# Patient Record
Sex: Female | Born: 1970
Health system: Southern US, Community
[De-identification: ages and names within clinical notes are randomized; demographics above are authoritative.]

## PROBLEM LIST (undated history)

## (undated) ENCOUNTER — Emergency Department (HOSPITAL_COMMUNITY): Discharge status: Absent without leave | Payer: Self-pay

## (undated) DIAGNOSIS — Z124 Encounter for screening for malignant neoplasm of cervix: Secondary | ICD-10-CM

## (undated) DIAGNOSIS — C50919 Malignant neoplasm of unspecified site of unspecified female breast: Secondary | ICD-10-CM

## (undated) DIAGNOSIS — M1990 Unspecified osteoarthritis, unspecified site: Secondary | ICD-10-CM

## (undated) DIAGNOSIS — J45909 Unspecified asthma, uncomplicated: Secondary | ICD-10-CM

## (undated) DIAGNOSIS — I429 Cardiomyopathy, unspecified: Secondary | ICD-10-CM

## (undated) DIAGNOSIS — Z0389 Encounter for observation for other suspected diseases and conditions ruled out: Secondary | ICD-10-CM

## (undated) DIAGNOSIS — K219 Gastro-esophageal reflux disease without esophagitis: Secondary | ICD-10-CM

## (undated) DIAGNOSIS — G43909 Migraine, unspecified, not intractable, without status migrainosus: Secondary | ICD-10-CM

## (undated) DIAGNOSIS — C7800 Secondary malignant neoplasm of unspecified lung: Secondary | ICD-10-CM

## (undated) DIAGNOSIS — I480 Paroxysmal atrial fibrillation: Secondary | ICD-10-CM

## (undated) DIAGNOSIS — I4892 Unspecified atrial flutter: Secondary | ICD-10-CM

## (undated) DIAGNOSIS — M539 Dorsopathy, unspecified: Secondary | ICD-10-CM

## (undated) DIAGNOSIS — C7931 Secondary malignant neoplasm of brain: Secondary | ICD-10-CM

## (undated) DIAGNOSIS — IMO0001 Reserved for inherently not codable concepts without codable children: Secondary | ICD-10-CM

## (undated) DIAGNOSIS — R928 Other abnormal and inconclusive findings on diagnostic imaging of breast: Secondary | ICD-10-CM

## (undated) HISTORY — DX: Unspecified osteoarthritis, unspecified site: M19.90

## (undated) HISTORY — PX: CHOLECYSTECTOMY: SHX55

## (undated) HISTORY — DX: Malignant neoplasm of unspecified site of unspecified female breast: C50.919

## (undated) HISTORY — DX: Encounter for screening for malignant neoplasm of cervix: Z12.4

## (undated) HISTORY — DX: Gastro-esophageal reflux disease without esophagitis: K21.9

## (undated) HISTORY — DX: Dorsopathy, unspecified: M53.9

## (undated) HISTORY — DX: Other abnormal and inconclusive findings on diagnostic imaging of breast: R92.8

---

## 2015-01-23 ENCOUNTER — Emergency Department (HOSPITAL_BASED_OUTPATIENT_CLINIC_OR_DEPARTMENT_OTHER): Payer: Federal, State, Local not specified - PPO

## 2015-01-23 ENCOUNTER — Emergency Department (HOSPITAL_BASED_OUTPATIENT_CLINIC_OR_DEPARTMENT_OTHER)
Admission: EM | Admit: 2015-01-23 | Discharge: 2015-01-23 | Disposition: A | Discharge status: Home / Self Care | Payer: Federal, State, Local not specified - PPO | Attending: Emergency Medicine | Admitting: Emergency Medicine

## 2015-01-23 ENCOUNTER — Encounter (HOSPITAL_BASED_OUTPATIENT_CLINIC_OR_DEPARTMENT_OTHER): Payer: Self-pay

## 2015-01-23 DIAGNOSIS — Z3202 Encounter for pregnancy test, result negative: Secondary | ICD-10-CM | POA: Diagnosis not present

## 2015-01-23 DIAGNOSIS — J159 Unspecified bacterial pneumonia: Secondary | ICD-10-CM | POA: Diagnosis not present

## 2015-01-23 DIAGNOSIS — R509 Fever, unspecified: Secondary | ICD-10-CM

## 2015-01-23 DIAGNOSIS — Z79899 Other long term (current) drug therapy: Secondary | ICD-10-CM | POA: Insufficient documentation

## 2015-01-23 DIAGNOSIS — M791 Myalgia: Secondary | ICD-10-CM | POA: Insufficient documentation

## 2015-01-23 DIAGNOSIS — R52 Pain, unspecified: Secondary | ICD-10-CM | POA: Diagnosis present

## 2015-01-23 DIAGNOSIS — J189 Pneumonia, unspecified organism: Secondary | ICD-10-CM

## 2015-01-23 LAB — PREGNANCY, URINE: Preg Test, Ur: NEGATIVE

## 2015-01-23 LAB — URINALYSIS, ROUTINE W REFLEX MICROSCOPIC
BILIRUBIN URINE: NEGATIVE
GLUCOSE, UA: NEGATIVE mg/dL
Ketones, ur: NEGATIVE mg/dL
Leukocytes, UA: NEGATIVE
NITRITE: NEGATIVE
PROTEIN: NEGATIVE mg/dL
SPECIFIC GRAVITY, URINE: 1.01 (ref 1.005–1.030)
Urobilinogen, UA: 0.2 mg/dL (ref 0.0–1.0)
pH: 5.5 (ref 5.0–8.0)

## 2015-01-23 LAB — URINE MICROSCOPIC-ADD ON

## 2015-01-23 MED ORDER — HYDROCODONE-ACETAMINOPHEN 5-325 MG PO TABS
1.0000 | ORAL_TABLET | Freq: Once | ORAL | Status: AC
  Administered 2015-01-23: 1 via ORAL
  Filled 2015-01-23: qty 1

## 2015-01-23 MED ORDER — LEVOFLOXACIN 750 MG PO TABS
750.0000 mg | ORAL_TABLET | Freq: Once | ORAL | Status: AC
  Administered 2015-01-23: 750 mg via ORAL
  Filled 2015-01-23: qty 1

## 2015-01-23 MED ORDER — LEVOFLOXACIN 500 MG PO TABS: 500.0000 mg | ORAL_TABLET | Freq: Every day | ORAL | Status: DC

## 2015-01-23 MED ORDER — HYDROCODONE-ACETAMINOPHEN 7.5-325 MG/15ML PO SOLN: 15.0000 mL | Freq: Four times a day (QID) | ORAL | Status: DC | PRN

## 2015-01-23 NOTE — ED Provider Notes (Signed)
CSN: [id number]     Arrival date & time 01/23/15  2094 History   None    Chief Complaint  Patient presents with  . Generalized Body Aches     HPI  She presents for evaluation of body aches and fever. Symptoms and just overall body aches and myalgias. Cough yesterday minimal cough today. Fever to 101 at home yesterday. Mild headache. No neck stiffness. Some urinary frequency and dysuria.  History reviewed. No pertinent past medical history. History reviewed. No pertinent past surgical history. No family history on file. History  Substance Use Topics  . Smoking status: Never Smoker   . Smokeless tobacco: Not on file  . Alcohol Use: No   OB History    No data available     Review of Systems  Constitutional: Positive for fever. Negative for chills, diaphoresis, appetite change and fatigue.  HENT: Negative for mouth sores, sore throat and trouble swallowing.   Eyes: Negative for visual disturbance.  Respiratory: Positive for cough. Negative for chest tightness, shortness of breath and wheezing.   Cardiovascular: Negative for chest pain.  Gastrointestinal: Negative for nausea, vomiting, abdominal pain, diarrhea and abdominal distention.  Endocrine: Negative for polydipsia, polyphagia and polyuria.  Genitourinary: Negative for dysuria, frequency and hematuria.  Musculoskeletal: Positive for myalgias. Negative for gait problem.  Skin: Negative for color change, pallor and rash.  Neurological: Positive for headaches. Negative for dizziness, syncope and light-headedness.  Hematological: Does not bruise/bleed easily.  Psychiatric/Behavioral: Negative for behavioral problems and confusion.      Allergies  Dilaudid; Ivp dye; and Toradol  Home Medications   Prior to Admission medications   Medication Sig Start Date End Date Taking? Authorizing Provider  cyclobenzaprine (FLEXERIL) 10 MG tablet Take 10 mg by mouth 3 (three) times daily as needed for muscle spasms.   Yes Historical  Provider, MD  HYDROcodone-acetaminophen (HYCET) 7.5-325 mg/15 ml solution Take 15 mLs by mouth 4 (four) times daily as needed for moderate pain (or cough). 01/23/15 01/23/16  Tanna Furry, MD  levofloxacin (LEVAQUIN) 500 MG tablet Take 1 tablet (500 mg total) by mouth daily. 01/23/15   Tanna Furry, MD   BP 128/78 mmHg  Pulse 71  Temp(Src) 99 F (37.2 C) (Oral)  Resp 20  Ht 5\' 7"  (1.702 m)  Wt 235 lb (106.595 kg)  BMI 36.80 kg/m2  SpO2 99%  LMP 12/14/2014 Physical Exam  Constitutional: She is oriented to person, place, and time. She appears well-developed and well-nourished. No distress.  HENT:  Head: Normocephalic.  Eyes: Conjunctivae are normal. Pupils are equal, round, and reactive to light. No scleral icterus.  Neck: Normal range of motion. Neck supple. No thyromegaly present.  Supple neck.  Cardiovascular: Normal rate and regular rhythm.  Exam reveals no gallop and no friction rub.   No murmur heard. Pulmonary/Chest: Effort normal and breath sounds normal. No respiratory distress. She has no wheezes. She has no rales.  Scattered rhonchi. Frequent cough. No prolongation.  Abdominal: Soft. Bowel sounds are normal. She exhibits no distension. There is no tenderness. There is no rebound.  Musculoskeletal: Normal range of motion.  Neurological: She is alert and oriented to person, place, and time.  Skin: Skin is warm and dry. No rash noted.  Psychiatric: She has a normal mood and affect. Her behavior is normal.    ED Course  Procedures (including critical care time) Labs Review Labs Reviewed  URINALYSIS, ROUTINE W REFLEX MICROSCOPIC - Abnormal; Notable for the following:    Hgb urine  dipstick SMALL (*)    All other components within normal limits  URINE MICROSCOPIC-ADD ON - Abnormal; Notable for the following:    Bacteria, UA MANY (*)    All other components within normal limits  PREGNANCY, URINE    Imaging Review Dg Chest 2 View  01/23/2015   CLINICAL DATA:  Diffuse myalgias.   Fever.  Headache.  EXAM: CHEST  2 VIEW  COMPARISON:  None.  FINDINGS: Poor inspiration. Borderline enlarged cardiac silhouette with a prominent left ventricular contour. Mildly prominent pulmonary vasculature. Questionable small amount of patchy opacity in the left lower lobe on the frontal view. This is not confirmed on the lateral view, limited by motion blurring. Mild thoracic spine degenerative changes.  IMPRESSION: 1. Mild patchy atelectasis or pneumonia in the left lower lobe. 2. Borderline cardiomegaly with possible left ventricular hypertrophy and mild pulmonary vascular congestion.   Electronically Signed   By: Claudie Revering M.D.   On: 01/23/2015 10:18     EKG Interpretation None      MDM   Final diagnoses:  Fever  Community acquired pneumonia    Subtle pneumonia. Patient feeling overall well. Discharged home. Levaquin, Motrin Tylenol, Lortab for cough. Stay hydrated. Recheck with any worsening symptoms.    Tanna Furry, MD 01/23/15 [phone]

## 2015-01-23 NOTE — ED Notes (Signed)
Patient here with general body aches x 2 days, fever last pm. Frontal headache and dysuria

## 2015-01-23 NOTE — Discharge Instructions (Signed)
Fever, Adult A fever is a temperature of 100.4 F (38 C) or above.  HOME CARE  Take fever medicine as told by your doctor. Do not  take aspirin for fever if you are younger than 45 years of age.  If you are given antibiotic medicine, take it as told. Finish the medicine even if you start to feel better.  Rest.  Drink enough fluids to keep your pee (urine) clear or pale yellow. Do not drink alcohol.  Take a bath or shower with room temperature water. Do not use ice water or alcohol sponge baths.  Wear lightweight, loose clothes. GET HELP RIGHT AWAY IF:   You are short of breath or have trouble breathing.  You are very weak.  You are dizzy or you pass out (faint).  You are very thirsty or are making little or no urine.  You have new pain.  You throw up (vomit) or have watery poop (diarrhea).  You keep throwing up or having watery poop for more than 1 to 2 days.  You have a stiff neck or light bothers your eyes.  You have a skin rash.  You have a fever or problems (symptoms) that last for more than 2 to 3 days.  You have a fever and your problems quickly get worse.  You keep throwing up the fluids you drink.  You do not feel better after 3 days.  You have new problems. MAKE SURE YOU:   Understand these instructions.  Will watch your condition.  Will get help right away if you are not doing well or get worse. Document Released: 05/29/2008 Document Revised: 11/12/2011 Document Reviewed: 06/21/2011 [facility] Patient Information 2015 [place], [place]. This information is not intended to replace advice given to you by your health care provider. Make sure you discuss any questions you have with your health care provider.  Pneumonia, Adult Pneumonia is an infection of the lungs. It may be caused by a germ (virus or bacteria). Some types of pneumonia can spread easily from person to person. This can happen when you cough or sneeze. HOME CARE  Only take medicine as told  by your doctor.  Take your medicine (antibiotics) as told. Finish it even if you start to feel better.  Do not smoke.  You may use a vaporizer or humidifier in your room. This can help loosen thick spit (mucus).  Sleep so you are almost sitting up (semi-upright). This helps reduce coughing.  Rest. A shot (vaccine) can help prevent pneumonia. Shots are often advised for:  People over 4 years old.  Patients on chemotherapy.  People with long-term (chronic) lung problems.  People with immune system problems. GET HELP RIGHT AWAY IF:   You are getting worse.  You cannot control your cough, and you are losing sleep.  You cough up blood.  Your pain gets worse, even with medicine.  You have a fever.  Any of your problems are getting worse, not better.  You have shortness of breath or chest pain. MAKE SURE YOU:   Understand these instructions.  Will watch your condition.  Will get help right away if you are not doing well or get worse. Document Released: 02/06/2008 Document Revised: 11/12/2011 Document Reviewed: 11/10/2010 [facility] Patient Information 2015 [place], [place]. This information is not intended to replace advice given to you by your health care provider. Make sure you discuss any questions you have with your health care provider.

## 2015-01-27 NOTE — ED Notes (Signed)
Patient called to state that she was seen and treated recently for pneumonia.  Now she is have chest pain and wants to know if she should come back in.  Reinforced her discharge instructions and encouraged to return for further evaluation here or with her primary care physician.

## 2015-02-19 ENCOUNTER — Encounter (HOSPITAL_BASED_OUTPATIENT_CLINIC_OR_DEPARTMENT_OTHER): Payer: Self-pay | Admitting: Emergency Medicine

## 2015-02-19 ENCOUNTER — Emergency Department (HOSPITAL_BASED_OUTPATIENT_CLINIC_OR_DEPARTMENT_OTHER)
Admission: EM | Admit: 2015-02-19 | Discharge: 2015-02-19 | Disposition: A | Discharge status: Home / Self Care | Payer: Federal, State, Local not specified - PPO | Attending: Emergency Medicine | Admitting: Emergency Medicine

## 2015-02-19 ENCOUNTER — Emergency Department (HOSPITAL_BASED_OUTPATIENT_CLINIC_OR_DEPARTMENT_OTHER): Payer: Federal, State, Local not specified - PPO

## 2015-02-19 DIAGNOSIS — Z792 Long term (current) use of antibiotics: Secondary | ICD-10-CM | POA: Diagnosis not present

## 2015-02-19 DIAGNOSIS — R079 Chest pain, unspecified: Secondary | ICD-10-CM | POA: Diagnosis present

## 2015-02-19 DIAGNOSIS — J45909 Unspecified asthma, uncomplicated: Secondary | ICD-10-CM | POA: Diagnosis not present

## 2015-02-19 DIAGNOSIS — R0789 Other chest pain: Secondary | ICD-10-CM | POA: Diagnosis not present

## 2015-02-19 HISTORY — DX: Unspecified asthma, uncomplicated: J45.909

## 2015-02-19 LAB — COMPREHENSIVE METABOLIC PANEL
ALT: 15 U/L (ref 14–54)
ANION GAP: 9 (ref 5–15)
AST: 17 U/L (ref 15–41)
Albumin: 3.6 g/dL (ref 3.5–5.0)
Alkaline Phosphatase: 56 U/L (ref 38–126)
BILIRUBIN TOTAL: 0.7 mg/dL (ref 0.3–1.2)
BUN: 13 mg/dL (ref 6–20)
CHLORIDE: 106 mmol/L (ref 101–111)
CO2: 23 mmol/L (ref 22–32)
Calcium: 9.8 mg/dL (ref 8.9–10.3)
Creatinine, Ser: 0.7 mg/dL (ref 0.44–1.00)
GFR calc Af Amer: >60 mL/min (ref >60)
GFR calc non Af Amer: >60 mL/min (ref >60)
GLUCOSE: 106 mg/dL — AB (ref 65–99)
POTASSIUM: 3.5 mmol/L (ref 3.5–5.1)
Sodium: 138 mmol/L (ref 135–145)
Total Protein: 7 g/dL (ref 6.5–8.1)

## 2015-02-19 LAB — CBC WITH DIFFERENTIAL/PLATELET
BASOS ABS: 0 10*3/uL (ref 0.0–0.1)
Basophils Relative: 0 % (ref 0–1)
EOS ABS: 0.2 10*3/uL (ref 0.0–0.7)
Eosinophils Relative: 2 % (ref 0–5)
HCT: 32.6 % — ABNORMAL LOW (ref 36.0–46.0)
Hemoglobin: 10.2 g/dL — ABNORMAL LOW (ref 12.0–15.0)
Lymphocytes Relative: 35 % (ref 12–46)
Lymphs Abs: 2.4 10*3/uL (ref 0.7–4.0)
MCH: 26.6 pg (ref 26.0–34.0)
MCHC: 31.3 g/dL (ref 30.0–36.0)
MCV: 85.1 fL (ref 78.0–100.0)
Monocytes Absolute: 0.6 10*3/uL (ref 0.1–1.0)
Monocytes Relative: 9 % (ref 3–12)
NEUTROS ABS: 3.8 10*3/uL (ref 1.7–7.7)
NEUTROS PCT: 54 % (ref 43–77)
Platelets: 361 10*3/uL (ref 150–400)
RBC: 3.83 MIL/uL — ABNORMAL LOW (ref 3.87–5.11)
RDW: 15.4 % (ref 11.5–15.5)
WBC: 7 10*3/uL (ref 4.0–10.5)

## 2015-02-19 LAB — LIPASE, BLOOD: Lipase: 13 U/L — ABNORMAL LOW (ref 22–51)

## 2015-02-19 LAB — D-DIMER, QUANTITATIVE: D-Dimer, Quant: <0.27 ug/mL-FEU (ref 0.00–0.48)

## 2015-02-19 MED ORDER — HYDROCODONE-ACETAMINOPHEN 5-325 MG PO TABS: 1.0000 | ORAL_TABLET | Freq: Four times a day (QID) | ORAL | Status: DC | PRN

## 2015-02-19 MED ORDER — MORPHINE SULFATE 2 MG/ML IJ SOLN
2.0000 mg | Freq: Once | INTRAMUSCULAR | Status: AC
  Administered 2015-02-19: 2 mg via INTRAVENOUS
  Filled 2015-02-19: qty 1

## 2015-02-19 NOTE — ED Notes (Signed)
Patient has a history of PNA 3 weeks ago. Patient states that she is having central chest pain and pressure worse tonight

## 2015-02-19 NOTE — Discharge Instructions (Signed)
Ibuprofen 600 mg 3 times daily for the next 3 days.  Hydrocodone as prescribed as needed for pain not relieved with ibuprofen.  Follow up with your primary Dr. if not improving by Monday, and return to the ER if symptoms significantly worsen or change.   Chest Wall Pain Chest wall pain is pain in or around the bones and muscles of your chest. It may take up to 6 weeks to get better. It may take longer if you must stay physically active in your work and activities.  CAUSES  Chest wall pain may happen on its own. However, it may be caused by:  A viral illness like the flu.  Injury.  Coughing.  Exercise.  Arthritis.  Fibromyalgia.  Shingles. HOME CARE INSTRUCTIONS   Avoid overtiring physical activity. Try not to strain or perform activities that cause pain. This includes any activities using your chest or your abdominal and side muscles, especially if heavy weights are used.  Put ice on the sore area.  Put ice in a plastic bag.  Place a towel between your skin and the bag.  Leave the ice on for 15-20 minutes per hour while awake for the first 2 days.  Only take over-the-counter or prescription medicines for pain, discomfort, or fever as directed by your caregiver. SEEK IMMEDIATE MEDICAL CARE IF:   Your pain increases, or you are very uncomfortable.  You have a fever.  Your chest pain becomes worse.  You have new, unexplained symptoms.  You have nausea or vomiting.  You feel sweaty or lightheaded.  You have a cough with phlegm (sputum), or you cough up blood. MAKE SURE YOU:   Understand these instructions.  Will watch your condition.  Will get help right away if you are not doing well or get worse. Document Released: 08/20/2005 Document Revised: 11/12/2011 Document Reviewed: 04/16/2011 [facility] Patient Information 2015 [place], [place]. This information is not intended to replace advice given to you by your health care provider. Make sure you discuss any  questions you have with your health care provider.

## 2015-02-19 NOTE — ED Notes (Signed)
Pt c/o ha  And chest pain x 3 days,  Worse tonight,  States having sob and nausea

## 2015-02-19 NOTE — ED Provider Notes (Signed)
CSN: [id number]     Arrival date & time 02/19/15  0001 History   None    Chief Complaint  Patient presents with  . Chest Pain     (Consider location/radiation/quality/duration/timing/severity/associated sxs/prior Treatment) HPI Comments: Patient is a 44 year old female with no prior cardiac history. She presents today for evaluation of chest pain. She reports this is been ongoing for the past several weeks since she was diagnosed with pneumonia, however has worsened over the past 2 days. She denies any fevers, chills, or productive cough. She reports feeling short of breath and nauseated. Her pain is worse with movement and palpation. There are no alleviating factors.  Patient is a 44 y.o. female presenting with chest pain. The history is provided by the patient.  Chest Pain Chest pain location: Anterior chest wall. Pain quality: sharp   Pain radiates to:  Does not radiate Pain radiates to the back: no   Pain severity:  Moderate Onset quality:  Sudden Duration:  3 weeks Timing:  Constant Progression:  Worsening Chronicity:  New Context: not breathing   Relieved by:  Nothing Worsened by:  Nothing tried Ineffective treatments:  None tried   Past Medical History  Diagnosis Date  . Asthma    Past Surgical History  Procedure Laterality Date  . Cholecystectomy     History reviewed. No pertinent family history. History  Substance Use Topics  . Smoking status: Never Smoker   . Smokeless tobacco: Not on file  . Alcohol Use: No   OB History    No data available     Review of Systems  Cardiovascular: Positive for chest pain.  All other systems reviewed and are negative.     Allergies  Dilaudid; Ivp dye; Percocet; and Toradol  Home Medications   Prior to Admission medications   Medication Sig Start Date End Date Taking? Authorizing Provider  cyclobenzaprine (FLEXERIL) 10 MG tablet Take 10 mg by mouth 3 (three) times daily as needed for muscle spasms.    Historical  Provider, MD  HYDROcodone-acetaminophen (HYCET) 7.5-325 mg/15 ml solution Take 15 mLs by mouth 4 (four) times daily as needed for moderate pain (or cough). 01/23/15 01/23/16  Tanna Furry, MD  levofloxacin (LEVAQUIN) 500 MG tablet Take 1 tablet (500 mg total) by mouth daily. 01/23/15   Tanna Furry, MD   BP 141/79 mmHg  Pulse 80  Temp(Src) 98.6 F (37 C) (Oral)  Resp 18  SpO2 97%  LMP 01/13/2015 Physical Exam  Constitutional: She is oriented to person, place, and time. She appears well-developed and well-nourished. No distress.  HENT:  Head: Normocephalic and atraumatic.  Neck: Normal range of motion. Neck supple.  Cardiovascular: Normal rate and regular rhythm.  Exam reveals no gallop and no friction rub.   No murmur heard. Pulmonary/Chest: Effort normal and breath sounds normal. No respiratory distress. She has no wheezes. She exhibits tenderness.  There is tenderness to palpation of the anterior chest wall which seems to reproduce her symptoms.  Abdominal: Soft. Bowel sounds are normal. She exhibits no distension. There is no tenderness.  Musculoskeletal: Normal range of motion. She exhibits no edema.  There is no lower extremity edema, calf tenderness, and Homans sign is absent bilaterally.  Neurological: She is alert and oriented to person, place, and time.  Skin: Skin is warm and dry. She is not diaphoretic.  Nursing note and vitals reviewed.   ED Course  Procedures (including critical care time) Labs Review Labs Reviewed  COMPREHENSIVE METABOLIC PANEL  LIPASE, BLOOD  D-DIMER, QUANTITATIVE (NOT AT [facility])  CBC WITH DIFFERENTIAL/PLATELET    Imaging Review No results found.   EKG Interpretation   Date/Time:  Saturday February 19 2015 00:10:16 EDT Ventricular Rate:  80 PR Interval:  162 QRS Duration: 76 QT Interval:  378 QTC Calculation: 435 R Axis:   9 Text Interpretation:  Normal sinus rhythm Cannot rule out Anterior infarct  , age undetermined Abnormal ECG Confirmed by  Harlowe Dowler  MD, Tylisa Alcivar ([id number]) on  02/19/2015 12:13:49 AM      MDM   Final diagnoses:  None    Patient presents with complaints of anterior chest wall pain that is reproducible with palpation. This is been ongoing for 3 weeks but worse over the past 2 days. She has an EKG showing no acute changes in her troponin is negative. Chest x-ray is clear and d-dimer is negative. I highly doubt an emergent situation and I feel as though her pain is musculoskeletal in nature. I will prescribe a small quantity of pain pills which she can take as needed. She is to follow-up with her primary Dr. if not improving, and return to the ER if symptoms significantly worsen.    Veryl Speak, MD 02/19/15 [phone]

## 2015-06-04 ENCOUNTER — Encounter (HOSPITAL_COMMUNITY): Payer: Self-pay | Admitting: Emergency Medicine

## 2015-06-04 ENCOUNTER — Emergency Department (HOSPITAL_COMMUNITY)
Admission: EM | Admit: 2015-06-04 | Discharge: 2015-06-05 | Disposition: A | Discharge status: Home / Self Care | Payer: Federal, State, Local not specified - PPO | Attending: Emergency Medicine | Admitting: Emergency Medicine

## 2015-06-04 DIAGNOSIS — J45909 Unspecified asthma, uncomplicated: Secondary | ICD-10-CM | POA: Diagnosis not present

## 2015-06-04 DIAGNOSIS — M5136 Other intervertebral disc degeneration, lumbar region: Secondary | ICD-10-CM | POA: Diagnosis not present

## 2015-06-04 DIAGNOSIS — M545 Low back pain: Secondary | ICD-10-CM | POA: Diagnosis present

## 2015-06-04 NOTE — ED Notes (Signed)
Pt complains of chronic lower back pain but has not had relief x2days with ibuprofin taken at home. Pt states pain radiates down right leg.

## 2015-06-04 NOTE — ED Notes (Signed)
Bed: WTR6 Expected date:  Expected time:  Means of arrival:  Comments: 

## 2015-06-05 MED ORDER — DIAZEPAM 5 MG PO TABS
5.0000 mg | ORAL_TABLET | Freq: Once | ORAL | Status: AC
  Administered 2015-06-05: 5 mg via ORAL
  Filled 2015-06-05: qty 1

## 2015-06-05 MED ORDER — HYDROCODONE-ACETAMINOPHEN 5-325 MG PO TABS: 2.0000 | ORAL_TABLET | ORAL | Status: DC | PRN

## 2015-06-05 MED ORDER — MORPHINE SULFATE (PF) 4 MG/ML IV SOLN
8.0000 mg | Freq: Once | INTRAVENOUS | Status: AC
  Administered 2015-06-05: 8 mg via INTRAMUSCULAR
  Filled 2015-06-05: qty 2

## 2015-06-05 MED ORDER — DIAZEPAM 5 MG PO TABS
5.0000 mg | ORAL_TABLET | Freq: Four times a day (QID) | ORAL | Status: DC | PRN
Start: 2015-06-05 — End: 2015-09-09

## 2015-06-05 MED ORDER — DEXAMETHASONE SODIUM PHOSPHATE 10 MG/ML IJ SOLN
10.0000 mg | Freq: Once | INTRAMUSCULAR | Status: AC
  Administered 2015-06-05: 10 mg via INTRAMUSCULAR
  Filled 2015-06-05: qty 1

## 2015-06-05 NOTE — ED Provider Notes (Signed)
CSN: [id number]     Arrival date & time 06/04/15  2328 History  By signing my name below, I, Cindy Bradshaw, attest that this documentation has been prepared under the direction and in the presence of Lacretia Leigh, MD. Electronically Signed: Altamease Bradshaw, ED Scribe. 06/05/2015. 12:50 AM   Chief Complaint  Patient presents with  . Back Pain   The history is provided by the patient. No language interpreter was used.   Cindy Bradshaw is a 44 y.o. female with history of DJD who presents to the Emergency Department complaining of an exacerbation of chronic right lower and mid back pain with onset yesterday. She describes the constant pain as a pulsation that radiates down the right leg. No mediating factors. She denies trauma or any heavy lifting. Today she is dragging her right leg while walking. No incontinence of bowel or bladder, bloody or dark urine, or numbness near the groin.  Past Medical History  Diagnosis Date  . Asthma    Past Surgical History  Procedure Laterality Date  . Cholecystectomy     No family history on file. Social History  Substance Use Topics  . Smoking status: Never Smoker   . Smokeless tobacco: None  . Alcohol Use: No   OB History    No data available     Review of Systems  Constitutional: Negative for fever and chills.  Gastrointestinal: Negative for nausea and vomiting.  Musculoskeletal: Positive for back pain and gait problem.  Neurological: Negative for weakness and numbness.  All other systems reviewed and are negative.  Allergies  Dilaudid; Ivp dye; Percocet; and Toradol  Home Medications   Prior to Admission medications   Medication Sig Start Date End Date Taking? Authorizing Provider  PROAIR HFA 108 (90 BASE) MCG/ACT inhaler Inhale 2 puffs into the lungs every 4 (four) hours as needed. For shortness of breath 03/28/15  Yes Historical Provider, MD  SUMAtriptan (IMITREX) 100 MG tablet Take 100 mg by mouth 2 (two) times daily as needed.  For migraines 03/30/15  Yes Historical Provider, MD  HYDROcodone-acetaminophen (NORCO) 5-325 MG per tablet Take 1-2 tablets by mouth every 6 (six) hours as needed. Patient not taking: Reported on 06/04/2015 02/19/15   Veryl Speak, MD  levofloxacin (LEVAQUIN) 500 MG tablet Take 1 tablet (500 mg total) by mouth daily. Patient not taking: Reported on 06/04/2015 01/23/15   Tanna Furry, MD   BP 152/81 mmHg  Pulse 92  Temp(Src) 98.3 F (36.8 C) (Oral)  Resp 16  SpO2 96%  LMP 06/04/2015 Physical Exam  Constitutional: She is oriented to person, place, and time. She appears well-developed and well-nourished.  Non-toxic appearance. No distress.  HENT:  Head: Normocephalic and atraumatic.  Eyes: Conjunctivae, EOM and lids are normal. Pupils are equal, round, and reactive to light.  Neck: Normal range of motion. Neck supple. No tracheal deviation present. No thyroid mass present.  Cardiovascular: Normal rate, regular rhythm and normal heart sounds.  Exam reveals no gallop.   No murmur heard. Pulmonary/Chest: Effort normal and breath sounds normal. No stridor. No respiratory distress. She has no decreased breath sounds. She has no wheezes. She has no rhonchi. She has no rales.  Abdominal: Soft. Normal appearance and bowel sounds are normal. She exhibits no distension. There is no tenderness. There is no rebound and no CVA tenderness.  Musculoskeletal: Normal range of motion. She exhibits no edema or tenderness.  Neurological: She is alert and oriented to person, place, and time. She has normal strength.  No cranial nerve deficit or sensory deficit. GCS eye subscore is 4. GCS verbal subscore is 5. GCS motor subscore is 6.  Reflex Scores:      Patellar reflexes are 2+ on the right side and 2+ on the left side. Skin: Skin is warm and dry. No abrasion and no rash noted.  Psychiatric: She has a normal mood and affect. Her speech is normal and behavior is normal.  Nursing note and vitals reviewed.   ED Course   Procedures (including critical care time)  DIAGNOSTIC STUDIES: Oxygen Saturation is 96% on RA,  normal by my interpretation.    COORDINATION OF CARE: 12:34 AM Discussed treatment plan which includes morphine, Valium, Decadron with pt at bedside and pt agreed to plan.  Labs Review Labs Reviewed - No data to display  Imaging Review No results found.   EKG Interpretation None      MDM   Final diagnoses:  None    I personally performed the services described in this documentation, which was scribed in my presence. The recorded information has been reviewed and is accurate.   Pt given pain meds, no red flags for cord compression. Patient stable for discharge   Lacretia Leigh, MD 06/05/15 [phone]

## 2015-06-05 NOTE — Discharge Instructions (Signed)
Back Pain, Adult Low back pain is very common. About 1 in 5 people have back pain.The cause of low back pain is rarely dangerous. The pain often gets better over time.About half of people with a sudden onset of back pain feel better in just 2 weeks. About 8 in 10 people feel better by 6 weeks.  CAUSES Some common causes of back pain include:  Strain of the muscles or ligaments supporting the spine.  Wear and tear (degeneration) of the spinal discs.  Arthritis.  Direct injury to the back. DIAGNOSIS Most of the time, the direct cause of low back pain is not known.However, back pain can be treated effectively even when the exact cause of the pain is unknown.Answering your caregiver's questions about your overall health and symptoms is one of the most accurate ways to make sure the cause of your pain is not dangerous. If your caregiver needs more information, he or she may order lab work or imaging tests (X-rays or MRIs).However, even if imaging tests show changes in your back, this usually does not require surgery. HOME CARE INSTRUCTIONS For many people, back pain returns.Since low back pain is rarely dangerous, it is often a condition that people can learn to manageon their own.   Remain active. It is stressful on the back to sit or stand in one place. Do not sit, drive, or stand in one place for more than 30 minutes at a time. Take short walks on level surfaces as soon as pain allows.Try to increase the length of time you walk each day.  Do not stay in bed.Resting more than 1 or 2 days can delay your recovery.  Do not avoid exercise or work.Your body is made to move.It is not dangerous to be active, even though your back may hurt.Your back will likely heal faster if you return to being active before your pain is gone.  Pay attention to your body when you bend and lift. Many people have less discomfortwhen lifting if they bend their knees, keep the load close to their bodies,and  avoid twisting. Often, the most comfortable positions are those that put less stress on your recovering back.  Find a comfortable position to sleep. Use a firm mattress and lie on your side with your knees slightly bent. If you lie on your back, put a pillow under your knees.  Only take over-the-counter or prescription medicines as directed by your caregiver. Over-the-counter medicines to reduce pain and inflammation are often the most helpful.Your caregiver may prescribe muscle relaxant drugs.These medicines help dull your pain so you can more quickly return to your normal activities and healthy exercise.  Put ice on the injured area.  Put ice in a plastic bag.  Place a towel between your skin and the bag.  Leave the ice on for 15-20 minutes, 03-04 times a day for the first 2 to 3 days. After that, ice and heat may be alternated to reduce pain and spasms.  Ask your caregiver about trying back exercises and gentle massage. This may be of some benefit.  Avoid feeling anxious or stressed.Stress increases muscle tension and can worsen back pain.It is important to recognize when you are anxious or stressed and learn ways to manage it.Exercise is a great option. SEEK MEDICAL CARE IF:  You have pain that is not relieved with rest or medicine.  You have pain that does not improve in 1 week.  You have new symptoms.  You are generally not feeling well. SEEK   IMMEDIATE MEDICAL CARE IF:   You have pain that radiates from your back into your legs.  You develop new bowel or bladder control problems.  You have unusual weakness or numbness in your arms or legs.  You develop nausea or vomiting.  You develop abdominal pain.  You feel faint. Document Released: 08/20/2005 Document Revised: 02/19/2012 Document Reviewed: 12/22/2013 Exit[employer] Patient Information 2015 [employer]. This information is not intended to replace advice given to you by your health care provider. Make sure you  discuss any questions you have with your health care provider.  

## 2015-06-05 NOTE — ED Notes (Signed)
Family at bedside. 

## 2015-08-02 ENCOUNTER — Other Ambulatory Visit: Payer: Self-pay | Admitting: Obstetrics and Gynecology

## 2015-08-02 ENCOUNTER — Other Ambulatory Visit (HOSPITAL_COMMUNITY)
Admission: RE | Admit: 2015-08-02 | Discharge: 2015-08-02 | Disposition: A | Discharge status: Home / Self Care | Payer: Federal, State, Local not specified - PPO | Source: Ambulatory Visit | Attending: Obstetrics and Gynecology | Admitting: Obstetrics and Gynecology

## 2015-08-02 DIAGNOSIS — Z1151 Encounter for screening for human papillomavirus (HPV): Secondary | ICD-10-CM | POA: Insufficient documentation

## 2015-08-02 DIAGNOSIS — Z01419 Encounter for gynecological examination (general) (routine) without abnormal findings: Secondary | ICD-10-CM | POA: Diagnosis present

## 2015-08-02 DIAGNOSIS — N6320 Unspecified lump in the left breast, unspecified quadrant: Secondary | ICD-10-CM

## 2015-08-04 LAB — CYTOLOGY - PAP

## 2015-08-12 ENCOUNTER — Ambulatory Visit
Admission: RE | Admit: 2015-08-12 | Discharge: 2015-08-12 | Disposition: A | Discharge status: Home / Self Care | Payer: Federal, State, Local not specified - PPO | Source: Ambulatory Visit | Attending: Obstetrics and Gynecology | Admitting: Obstetrics and Gynecology

## 2015-08-12 ENCOUNTER — Other Ambulatory Visit: Payer: Self-pay | Admitting: Obstetrics and Gynecology

## 2015-08-12 DIAGNOSIS — N6320 Unspecified lump in the left breast, unspecified quadrant: Secondary | ICD-10-CM

## 2015-08-12 DIAGNOSIS — Z124 Encounter for screening for malignant neoplasm of cervix: Secondary | ICD-10-CM

## 2015-08-12 HISTORY — DX: Encounter for screening for malignant neoplasm of cervix: Z12.4

## 2015-08-17 ENCOUNTER — Ambulatory Visit
Admission: RE | Admit: 2015-08-17 | Discharge: 2015-08-17 | Disposition: A | Discharge status: Home / Self Care | Payer: Federal, State, Local not specified - PPO | Source: Ambulatory Visit | Attending: Obstetrics and Gynecology | Admitting: Obstetrics and Gynecology

## 2015-08-17 DIAGNOSIS — N6320 Unspecified lump in the left breast, unspecified quadrant: Secondary | ICD-10-CM

## 2015-08-24 ENCOUNTER — Other Ambulatory Visit: Payer: Self-pay | Admitting: Obstetrics and Gynecology

## 2015-08-24 DIAGNOSIS — R928 Other abnormal and inconclusive findings on diagnostic imaging of breast: Secondary | ICD-10-CM

## 2015-08-24 HISTORY — DX: Other abnormal and inconclusive findings on diagnostic imaging of breast: R92.8

## 2015-09-01 ENCOUNTER — Other Ambulatory Visit: Payer: Self-pay | Admitting: General Surgery

## 2015-09-02 ENCOUNTER — Telehealth: Payer: Self-pay | Admitting: Hematology and Oncology

## 2015-09-02 NOTE — Telephone Encounter (Signed)
new breast appt-s/w patient and gave np appt for 1/04 @ 3:45 w/Dr. Lindi Adie.  Referring Dr. Excell Seltzer  Referral information scanned

## 2015-09-07 ENCOUNTER — Ambulatory Visit (HOSPITAL_BASED_OUTPATIENT_CLINIC_OR_DEPARTMENT_OTHER): Payer: Federal, State, Local not specified - PPO | Admitting: Hematology and Oncology

## 2015-09-07 ENCOUNTER — Encounter: Payer: Self-pay | Admitting: Hematology and Oncology

## 2015-09-07 ENCOUNTER — Other Ambulatory Visit: Payer: Self-pay | Admitting: Hematology and Oncology

## 2015-09-07 VITALS — BP 138/81 | HR 80 | Temp 98.5°F | Resp 18 | Ht 67.0 in | Wt 235.1 lb

## 2015-09-07 DIAGNOSIS — Z171 Estrogen receptor negative status [ER-]: Secondary | ICD-10-CM

## 2015-09-07 DIAGNOSIS — C50412 Malignant neoplasm of upper-outer quadrant of left female breast: Secondary | ICD-10-CM | POA: Diagnosis not present

## 2015-09-07 MED ORDER — LORAZEPAM 0.5 MG PO TABS: 0.5000 mg | ORAL_TABLET | Freq: Every day | ORAL | Status: DC

## 2015-09-07 MED ORDER — DEXAMETHASONE 4 MG PO TABS: 4.0000 mg | ORAL_TABLET | Freq: Two times a day (BID) | ORAL | Status: DC

## 2015-09-07 MED ORDER — ONDANSETRON HCL 8 MG PO TABS: 8.0000 mg | ORAL_TABLET | Freq: Two times a day (BID) | ORAL | Status: DC | PRN

## 2015-09-07 MED ORDER — LIDOCAINE-PRILOCAINE 2.5-2.5 % EX CREA: TOPICAL_CREAM | CUTANEOUS | Status: DC

## 2015-09-07 MED ORDER — PROCHLORPERAZINE MALEATE 10 MG PO TABS: 10.0000 mg | ORAL_TABLET | Freq: Four times a day (QID) | ORAL | Status: DC | PRN

## 2015-09-07 NOTE — Progress Notes (Signed)
[facility] CONSULT NOTE  Patient Care Team: No Pcp Per Patient as PCP - General (General Practice)  CHIEF COMPLAINTS/PURPOSE OF CONSULTATION:  Newly diagnosed breast cancer  HISTORY OF PRESENTING ILLNESS:  Cindy Bradshaw 45 y.o. female is here because of recent diagnosis of Left breast cancer. She is a 45 year old premenopausal lady with a palpable lump in the left breast that started about a couple of months ago. She informed her primary care physician who obtained a breast mammogram that revealed a 3.1 cm lesion at 1:30 position left breast ultrasound of the axilla was negative. Biopsy of this mass was performed on 08/17/2015 which came back as invasive ductal carcinoma triple negative disease. Ki-67 was 90% and it was grade 3. She was referred to me for discussion regarding neoadjuvant chemotherapy. She is complaining of increasing pain and tenderness in the left upper outer quadrant of the breast.  I reviewed her records extensively and collaborated the history with the patient.  SUMMARY OF ONCOLOGIC HISTORY:   Breast cancer of upper-outer quadrant of left female breast ([facility])   08/17/2015 Initial Diagnosis left breast biopsy 1:30 position: Invasive ductal carcinoma, grade 3, ER 0%, PR 0%, HER-2 negative ratio 1.48, Ki-67 90%, 3.1 cm tumor, axilla negative, T2 N0 stage II a clinical stage   MEDICAL HISTORY:  Past Medical History  Diagnosis Date  . Asthma     SURGICAL HISTORY: Past Surgical History  Procedure Laterality Date  . Cholecystectomy      SOCIAL HISTORY: Social History   Social History  . Marital Status: Married    Spouse Name: [name]  . Number of Children: [name]  . Years of Education: [name]   Occupational History  . Not on file.   Social History Main Topics  . Smoking status: Never Smoker   . Smokeless tobacco: Not on file  . Alcohol Use: No  . Drug Use: Not on file  . Sexual Activity: Not on file   Other Topics Concern  . Not on file    Social History Narrative    FAMILY HISTORY: No family history on file.  ALLERGIES:  is allergic to dilaudid; ivp dye; percocet; and toradol.  MEDICATIONS:  Current Outpatient Prescriptions  Medication Sig Dispense Refill  . diazepam (VALIUM) 5 MG tablet Take 1 tablet (5 mg total) by mouth every 6 (six) hours as needed for muscle spasms. 15 tablet 0  . HYDROcodone-acetaminophen (NORCO/VICODIN) 5-325 MG tablet Take 2 tablets by mouth every 4 (four) hours as needed. 15 tablet 0  . levofloxacin (LEVAQUIN) 500 MG tablet Take 1 tablet (500 mg total) by mouth daily. (Patient not taking: Reported on 06/04/2015) 10 tablet 0  . PROAIR HFA 108 (90 BASE) MCG/ACT inhaler Inhale 2 puffs into the lungs every 4 (four) hours as needed. For shortness of breath  3  . SUMAtriptan (IMITREX) 100 MG tablet Take 100 mg by mouth 2 (two) times daily as needed. For migraines  0   No current facility-administered medications for this visit.    REVIEW OF SYSTEMS:   Constitutional: Denies fevers, chills or abnormal night sweats Eyes: Denies blurriness of vision, double vision or watery eyes Ears, nose, mouth, throat, and face: Denies mucositis or sore throat Respiratory: Denies cough, dyspnea or wheezes Cardiovascular: Denies palpitation, chest discomfort or lower extremity swelling Gastrointestinal:  Denies nausea, heartburn or change in bowel habits Skin: Denies abnormal skin rashes Lymphatics: Denies new lymphadenopathy or easy bruising Neurological:Denies numbness, tingling or new weaknesses Behavioral/Psych: Mood is stable,  no new changes  Breast: pain in the left breast upper outer quadrant All other systems were reviewed with the patient and are negative.  PHYSICAL EXAMINATION: ECOG PERFORMANCE STATUS: 1 - Symptomatic but completely ambulatory  Filed Vitals:   09/07/15 1602  BP: 138/81  Pulse: 80  Temp: 98.5 F (36.9 C)  Resp: 18   Filed Weights   09/07/15 1602  Weight: 235 lb 1.6 oz  (106.641 kg)    GENERAL:alert, no distress and comfortable SKIN: skin color, texture, turgor are normal, no rashes or significant lesions EYES: normal, conjunctiva are pink and non-injected, sclera clear OROPHARYNX:no exudate, no erythema and lips, buccal mucosa, and tongue normal  NECK: supple, thyroid normal size, non-tender, without nodularity LYMPH:  no palpable lymphadenopathy in the cervical, axillary or inguinal LUNGS: clear to auscultation and percussion with normal breathing effort HEART: regular rate & rhythm and no murmurs and no lower extremity edema ABDOMEN:abdomen soft, non-tender and normal bowel sounds Musculoskeletal:no cyanosis of digits and no clubbing  PSYCH: alert & oriented x 3 with fluent speech NEURO: no focal motor/sensory deficits BREAST:large palpable mass in the left breast measuring at least 5 x 6 cm to clinical examination this is very tender to touch.. No palpable axillary or supraclavicular lymphadenopathy (exam performed in the presence of a chaperone)   LABORATORY DATA:  I have reviewed the data as listed Lab Results  Component Value Date   WBC 7.0 02/19/2015   HGB 10.2* 02/19/2015   HCT 32.6* 02/19/2015   MCV 85.1 02/19/2015   PLT 361 02/19/2015   Lab Results  Component Value Date   NA 138 02/19/2015   K 3.5 02/19/2015   CL 106 02/19/2015   CO2 23 02/19/2015   ASSESSMENT AND PLAN:  Breast cancer of upper-outer quadrant of left female breast ([facility]) Left breast biopsy 08/17/2015:1:30 position: Invasive ductal carcinoma, grade 3, ER 0%, PR 0%, HER-2 negative ratio 1.48, Ki-67 90%, 3.1 cm tumor, axilla negative, T2 N0 stage II a clinical stage  Pathology and radiology counseling:Discussed with the patient, the details of pathology including the type of breast cancer,the clinical staging, the significance of ER, PR and HER-2/neu receptors and the implications for treatment. After reviewing the pathology in detail, we proceeded to discuss the  different treatment options between surgery, radiation, chemotherapy.  Recommendation: 1. Neoadjuvant chemotherapy with dose dense Adriamycin and Cytoxan 4 followed by Taxol and carboplatin weekly 12 2. Followed by surgery 3. Followed by adjuvant radiation  Chemotherapy counseling:I have discussed the risks and benefits of chemotherapy including the risks of nausea/ vomiting, risk of infection from low WBC count, fatigue due to chemo or anemia, bruising or bleeding due to low platelets, mouth sores, loss/ change in taste and decreased appetite. Liver and kidney function will be monitored through out chemotherapy as abnormalities in liver and kidney function may be a side effect of treatment. Cardiac dysfunction due to Adriamycin was discussed in detail. Risk of permanent bone marrow dysfunction and leukemia due to chemo were also discussed.  Plan: 1. Breast MRI 2. Genetic counseling 3. Echocardiogram 4. Chemotherapy counseling 5. We will request a port placement from Dr. Excell Seltzer  Return to clinic in 1-2 weeks to start cycle 1 chemotherapy  All questions were answered. The patient knows to call the clinic with any problems, questions or concerns.    Rulon Eisenmenger, MD 09/07/2015

## 2015-09-07 NOTE — Assessment & Plan Note (Signed)
Left breast biopsy 08/17/2015:1:30 position: Invasive ductal carcinoma, grade 3, ER 0%, PR 0%, HER-2 negative ratio 1.48, Ki-67 90%, 3.1 cm tumor, axilla negative, T2 N0 stage II a clinical stage  Pathology and radiology counseling:Discussed with the patient, the details of pathology including the type of breast cancer,the clinical staging, the significance of ER, PR and HER-2/neu receptors and the implications for treatment. After reviewing the pathology in detail, we proceeded to discuss the different treatment options between surgery, radiation, chemotherapy.  Recommendation: 1. Neoadjuvant chemotherapy with dose dense Adriamycin and Cytoxan 4 followed by Taxol and carboplatin weekly 12 2. Followed by surgery 3. Followed by adjuvant radiation  Chemotherapy counseling:I have discussed the risks and benefits of chemotherapy including the risks of nausea/ vomiting, risk of infection from low WBC count, fatigue due to chemo or anemia, bruising or bleeding due to low platelets, mouth sores, loss/ change in taste and decreased appetite. Liver and kidney function will be monitored through out chemotherapy as abnormalities in liver and kidney function may be a side effect of treatment. Cardiac dysfunction due to Adriamycin was discussed in detail. Risk of permanent bone marrow dysfunction and leukemia due to chemo were also discussed.  Plan: 1. Breast MRI 2. Genetic counseling 3. Echocardiogram 4. Chemotherapy counseling 5. We will request a port placement from Dr. Excell Seltzer  Return to clinic in 1-2 weeks to start cycle 1 chemotherapy

## 2015-09-07 NOTE — Addendum Note (Signed)
Addended by: Prentiss Bells on: 09/07/2015 07:22 PM   Modules accepted: Orders, Medications

## 2015-09-08 ENCOUNTER — Encounter: Payer: Self-pay | Admitting: Hematology and Oncology

## 2015-09-08 ENCOUNTER — Encounter: Payer: Self-pay | Admitting: *Deleted

## 2015-09-08 ENCOUNTER — Other Ambulatory Visit: Payer: Self-pay | Admitting: General Surgery

## 2015-09-08 ENCOUNTER — Other Ambulatory Visit: Payer: Self-pay | Admitting: *Deleted

## 2015-09-08 DIAGNOSIS — C50412 Malignant neoplasm of upper-outer quadrant of left female breast: Secondary | ICD-10-CM

## 2015-09-08 NOTE — Progress Notes (Signed)
Rx for ativan and dexamethasone faxed to [facility].  Sent to scan.

## 2015-09-09 ENCOUNTER — Other Ambulatory Visit: Payer: Self-pay | Admitting: *Deleted

## 2015-09-09 ENCOUNTER — Telehealth: Payer: Self-pay | Admitting: Hematology and Oncology

## 2015-09-09 ENCOUNTER — Other Ambulatory Visit: Payer: Self-pay | Admitting: Hematology and Oncology

## 2015-09-09 ENCOUNTER — Encounter (HOSPITAL_COMMUNITY): Payer: Self-pay | Admitting: *Deleted

## 2015-09-09 NOTE — Telephone Encounter (Signed)
Aware of echo app 1/10 after chemo ed

## 2015-09-10 NOTE — Anesthesia Preprocedure Evaluation (Signed)
Anesthesia Evaluation  Patient identified by MRN, date of birth, ID band Patient awake    Reviewed: Allergy & Precautions, H&P , Patient's Chart, lab work & pertinent test results, reviewed documented beta blocker date and time   Airway Mallampati: II  TM Distance: >3 FB Neck ROM: full    Dental no notable dental hx.    Pulmonary asthma ,    Pulmonary exam normal breath sounds clear to auscultation       Cardiovascular  Rhythm:regular Rate:Normal     Neuro/Psych    GI/Hepatic GERD  ,  Endo/Other    Renal/GU      Musculoskeletal   Abdominal   Peds  Hematology   Anesthesia Other Findings Obesity GERD Hx of Asthma  Reproductive/Obstetrics                             Anesthesia Physical Anesthesia Plan  ASA: II  Anesthesia Plan:    Post-op Pain Management:    Induction: Intravenous  Airway Management Planned: LMA  Additional Equipment:   Intra-op Plan:   Post-operative Plan:   Informed Consent: I have reviewed the patients History and Physical, chart, labs and discussed the procedure including the risks, benefits and alternatives for the proposed anesthesia with the patient or authorized representative who has indicated his/her understanding and acceptance.   Dental Advisory Given and Dental advisory given  Plan Discussed with: CRNA and Surgeon  Anesthesia Plan Comments: (Discussed GA with LMA, possible sore throat, potential need to switch to ETT, N/V, pulmonary aspiration. Questions answered. )        Anesthesia Quick Evaluation

## 2015-09-11 ENCOUNTER — Telehealth: Payer: Self-pay | Admitting: Hematology and Oncology

## 2015-09-11 MED ORDER — DEXTROSE 5 % IV SOLN
3.0000 g | INTRAVENOUS | Status: AC
  Administered 2015-09-12: 3 g via INTRAVENOUS
  Filled 2015-09-11: qty 3000

## 2015-09-11 NOTE — Telephone Encounter (Signed)
Called patient and left a message with added md and chemo appointments and to get a printout 1/10

## 2015-09-12 ENCOUNTER — Ambulatory Visit (HOSPITAL_COMMUNITY): Payer: Federal, State, Local not specified - PPO | Admitting: Anesthesiology

## 2015-09-12 ENCOUNTER — Encounter (HOSPITAL_COMMUNITY)
Admission: RE | Disposition: A | Discharge status: Home / Self Care | Payer: Self-pay | Source: Ambulatory Visit | Attending: General Surgery

## 2015-09-12 ENCOUNTER — Other Ambulatory Visit: Payer: Federal, State, Local not specified - PPO

## 2015-09-12 ENCOUNTER — Ambulatory Visit (HOSPITAL_COMMUNITY): Payer: Federal, State, Local not specified - PPO

## 2015-09-12 ENCOUNTER — Encounter (HOSPITAL_COMMUNITY): Payer: Self-pay | Admitting: *Deleted

## 2015-09-12 ENCOUNTER — Ambulatory Visit (HOSPITAL_COMMUNITY)
Admission: RE | Admit: 2015-09-12 | Discharge: 2015-09-12 | Disposition: A | Discharge status: Home / Self Care | Payer: Federal, State, Local not specified - PPO | Source: Ambulatory Visit | Attending: General Surgery | Admitting: General Surgery

## 2015-09-12 DIAGNOSIS — Z91041 Radiographic dye allergy status: Secondary | ICD-10-CM | POA: Insufficient documentation

## 2015-09-12 DIAGNOSIS — Z79899 Other long term (current) drug therapy: Secondary | ICD-10-CM | POA: Insufficient documentation

## 2015-09-12 DIAGNOSIS — C50412 Malignant neoplasm of upper-outer quadrant of left female breast: Secondary | ICD-10-CM

## 2015-09-12 DIAGNOSIS — Z780 Asymptomatic menopausal state: Secondary | ICD-10-CM | POA: Insufficient documentation

## 2015-09-12 DIAGNOSIS — Z803 Family history of malignant neoplasm of breast: Secondary | ICD-10-CM | POA: Diagnosis not present

## 2015-09-12 DIAGNOSIS — K219 Gastro-esophageal reflux disease without esophagitis: Secondary | ICD-10-CM | POA: Insufficient documentation

## 2015-09-12 DIAGNOSIS — Z95828 Presence of other vascular implants and grafts: Secondary | ICD-10-CM

## 2015-09-12 DIAGNOSIS — J45909 Unspecified asthma, uncomplicated: Secondary | ICD-10-CM | POA: Insufficient documentation

## 2015-09-12 DIAGNOSIS — C50612 Malignant neoplasm of axillary tail of left female breast: Secondary | ICD-10-CM | POA: Diagnosis not present

## 2015-09-12 DIAGNOSIS — C50912 Malignant neoplasm of unspecified site of left female breast: Secondary | ICD-10-CM | POA: Diagnosis present

## 2015-09-12 HISTORY — PX: PORTACATH PLACEMENT: SHX2246

## 2015-09-12 LAB — HCG, SERUM, QUALITATIVE: PREG SERUM: NEGATIVE

## 2015-09-12 LAB — CBC
HCT: 31.4 % — ABNORMAL LOW (ref 36.0–46.0)
HEMOGLOBIN: 9.6 g/dL — AB (ref 12.0–15.0)
MCH: 26 pg (ref 26.0–34.0)
MCHC: 30.6 g/dL (ref 30.0–36.0)
MCV: 85.1 fL (ref 78.0–100.0)
PLATELETS: 373 10*3/uL (ref 150–400)
RBC: 3.69 MIL/uL — ABNORMAL LOW (ref 3.87–5.11)
RDW: 15.7 % — ABNORMAL HIGH (ref 11.5–15.5)
WBC: 6 10*3/uL (ref 4.0–10.5)

## 2015-09-12 SURGERY — INSERTION, TUNNELED CENTRAL VENOUS DEVICE, WITH PORT
Anesthesia: General | Site: Chest

## 2015-09-12 MED ORDER — LIDOCAINE HCL (CARDIAC) 20 MG/ML IV SOLN
INTRAVENOUS | Status: DC | PRN
  Administered 2015-09-12: 100 mg via INTRAVENOUS

## 2015-09-12 MED ORDER — FENTANYL CITRATE (PF) 100 MCG/2ML IJ SOLN
INTRAMUSCULAR | Status: DC | PRN
  Administered 2015-09-12 (×2): 50 ug via INTRAVENOUS

## 2015-09-12 MED ORDER — 0.9 % SODIUM CHLORIDE (POUR BTL) OPTIME
TOPICAL | Status: DC | PRN
  Administered 2015-09-12: 250 mL

## 2015-09-12 MED ORDER — ONDANSETRON HCL 4 MG/2ML IJ SOLN
INTRAMUSCULAR | Status: AC
  Filled 2015-09-12: qty 2

## 2015-09-12 MED ORDER — CHLORHEXIDINE GLUCONATE 4 % EX LIQD: 1.0000 "application " | Freq: Once | CUTANEOUS | Status: DC

## 2015-09-12 MED ORDER — HEPARIN SOD (PORK) LOCK FLUSH 100 UNIT/ML IV SOLN
INTRAVENOUS | Status: AC
  Filled 2015-09-12: qty 5

## 2015-09-12 MED ORDER — MIDAZOLAM HCL 5 MG/5ML IJ SOLN
INTRAMUSCULAR | Status: DC | PRN
  Administered 2015-09-12: 2 mg via INTRAVENOUS

## 2015-09-12 MED ORDER — ONDANSETRON HCL 4 MG/2ML IJ SOLN
INTRAMUSCULAR | Status: DC | PRN
  Administered 2015-09-12: 4 mg via INTRAVENOUS

## 2015-09-12 MED ORDER — BUPIVACAINE-EPINEPHRINE (PF) 0.25% -1:200000 IJ SOLN
INTRAMUSCULAR | Status: AC
  Filled 2015-09-12: qty 30

## 2015-09-12 MED ORDER — ACETAMINOPHEN 160 MG/5ML PO SOLN: 975.0000 mg | Freq: Once | ORAL | Status: DC

## 2015-09-12 MED ORDER — HYDROCODONE-ACETAMINOPHEN 10-325 MG PO TABS
1.0000 | ORAL_TABLET | ORAL | Status: DC | PRN
  Administered 2015-09-12: 1 via ORAL
  Filled 2015-09-12 (×2): qty 2
  Filled 2015-09-12: qty 1

## 2015-09-12 MED ORDER — FENTANYL CITRATE (PF) 250 MCG/5ML IJ SOLN
INTRAMUSCULAR | Status: AC
  Filled 2015-09-12: qty 5

## 2015-09-12 MED ORDER — MIDAZOLAM HCL 2 MG/2ML IJ SOLN
INTRAMUSCULAR | Status: AC
  Filled 2015-09-12: qty 2

## 2015-09-12 MED ORDER — LACTATED RINGERS IV SOLN
INTRAVENOUS | Status: DC | PRN
  Administered 2015-09-12: 07:00:00 via INTRAVENOUS

## 2015-09-12 MED ORDER — FENTANYL CITRATE (PF) 100 MCG/2ML IJ SOLN
INTRAMUSCULAR | Status: AC
  Filled 2015-09-12: qty 2

## 2015-09-12 MED ORDER — HEPARIN SOD (PORK) LOCK FLUSH 100 UNIT/ML IV SOLN
INTRAVENOUS | Status: DC | PRN
  Administered 2015-09-12: 500 [IU]

## 2015-09-12 MED ORDER — HYDROCODONE-ACETAMINOPHEN 10-325 MG PO TABS: 1.0000 | ORAL_TABLET | ORAL | Status: DC | PRN

## 2015-09-12 MED ORDER — SODIUM CHLORIDE 0.9 % IV SOLN
Freq: Once | INTRAVENOUS | Status: AC
  Administered 2015-09-12: 100 mL
  Filled 2015-09-12: qty 1.2

## 2015-09-12 MED ORDER — PROPOFOL 10 MG/ML IV BOLUS
INTRAVENOUS | Status: AC
  Filled 2015-09-12: qty 40

## 2015-09-12 MED ORDER — BUPIVACAINE-EPINEPHRINE 0.25% -1:200000 IJ SOLN
INTRAMUSCULAR | Status: DC | PRN
  Administered 2015-09-12: 13 mL

## 2015-09-12 MED ORDER — FENTANYL CITRATE (PF) 100 MCG/2ML IJ SOLN
25.0000 ug | INTRAMUSCULAR | Status: DC | PRN
  Administered 2015-09-12 (×2): 50 ug via INTRAVENOUS

## 2015-09-12 MED ORDER — PROPOFOL 10 MG/ML IV BOLUS
INTRAVENOUS | Status: DC | PRN
  Administered 2015-09-12: 200 mg via INTRAVENOUS
  Administered 2015-09-12: 70 mg via INTRAVENOUS

## 2015-09-12 MED ORDER — LIDOCAINE HCL (CARDIAC) 20 MG/ML IV SOLN
INTRAVENOUS | Status: AC
  Filled 2015-09-12: qty 5

## 2015-09-12 SURGICAL SUPPLY — 34 items
BAG DECANTER FOR FLEXI CONT (MISCELLANEOUS) ×2 IMPLANT
BENZOIN TINCTURE PRP APPL 2/3 (GAUZE/BANDAGES/DRESSINGS) ×2 IMPLANT
BLADE SURG 15 STRL LF DISP TIS (BLADE) ×1 IMPLANT
BLADE SURG 15 STRL SS (BLADE) ×1
CHLORAPREP W/TINT 26ML (MISCELLANEOUS) ×2 IMPLANT
COVER SURGICAL LIGHT HANDLE (MISCELLANEOUS) ×2 IMPLANT
DECANTER SPIKE VIAL GLASS SM (MISCELLANEOUS) IMPLANT
DRAPE C-ARM 42X120 X-RAY (DRAPES) ×2 IMPLANT
DRAPE LAPAROSCOPIC ABDOMINAL (DRAPES) ×2 IMPLANT
ELECT PENCIL ROCKER SW 15FT (MISCELLANEOUS) ×2 IMPLANT
ELECT REM PT RETURN 9FT ADLT (ELECTROSURGICAL) ×2
ELECTRODE REM PT RTRN 9FT ADLT (ELECTROSURGICAL) ×1 IMPLANT
GAUZE SPONGE 4X4 12PLY STRL (GAUZE/BANDAGES/DRESSINGS) IMPLANT
GAUZE SPONGE 4X4 16PLY XRAY LF (GAUZE/BANDAGES/DRESSINGS) ×2 IMPLANT
GLOVE BIO SURGEON STRL SZ7.5 (GLOVE) ×2 IMPLANT
GLOVE BIOGEL PI IND STRL 7.0 (GLOVE) ×3 IMPLANT
GLOVE BIOGEL PI IND STRL 7.5 (GLOVE) ×2 IMPLANT
GLOVE BIOGEL PI INDICATOR 7.0 (GLOVE) ×3
GLOVE BIOGEL PI INDICATOR 7.5 (GLOVE) ×2
GLOVE ECLIPSE 7.5 STRL STRAW (GLOVE) ×2 IMPLANT
GOWN STRL REUS W/TWL XL LVL3 (GOWN DISPOSABLE) ×6 IMPLANT
KIT BASIN OR (CUSTOM PROCEDURE TRAY) ×2 IMPLANT
KIT PORT POWER 8FR ISP CVUE (Catheter) ×2 IMPLANT
LIQUID BAND (GAUZE/BANDAGES/DRESSINGS) ×2 IMPLANT
MARKER SKIN DUAL TIP RULER LAB (MISCELLANEOUS) ×2 IMPLANT
NEEDLE HYPO 25X1 1.5 SAFETY (NEEDLE) ×2 IMPLANT
PACK BASIC VI WITH GOWN DISP (CUSTOM PROCEDURE TRAY) ×2 IMPLANT
STRIP CLOSURE SKIN 1/2X4 (GAUZE/BANDAGES/DRESSINGS) IMPLANT
SUT MNCRL AB 4-0 PS2 18 (SUTURE) ×2 IMPLANT
SUT PROLENE 2 0 CT2 30 (SUTURE) ×4 IMPLANT
SYR 10ML ECCENTRIC (SYRINGE) ×2 IMPLANT
SYR CONTROL 10ML LL (SYRINGE) ×2 IMPLANT
TOWEL OR 17X26 10 PK STRL BLUE (TOWEL DISPOSABLE) ×2 IMPLANT
TOWEL OR NON WOVEN STRL DISP B (DISPOSABLE) ×2 IMPLANT

## 2015-09-12 NOTE — Op Note (Signed)
Preoperative diagnosis: Cancer of the breast and the poor venous access  Postoperative diagnosis: Same  Procedure: Placement of ClearVue subcutaneous venous port  Surgeon: Excell Seltzer M.D.  Anesthesia: LMA general  Description of procedure: Patient is brought to the operating room and placed in the supine position on the operating table. IV sedation was administered. The entire upper chest and neck were widely sterilely prepped and draped. Local anesthesia was used to infiltrate the insertion of port site. The right subclavian vein was cannulated with a needle and guidewire without difficulty.  Initially the wire crossed the midline but with several passes turned into the SVC. The introducer was then placed over the guidewire and the flushed catheter placed via the introducer which was stripped away and the tip of the catheter positioned near the cavoatrial junction. A small transverse incision was made in the anterior chest wall and subcutaneous pocket created. The catheter was tunneled into the pocket, trimmed to length, and attached to the flushed port which was positioned in the pocket. The port was sutured to the chest wall with interrupted 2-0 Prolene. The incisions were closed with subcutaneous interrupted Monocryl and the skin incisions closed with subcuticular Monocryl and Dermabond. The port was accessed and flushed and aspirated easily and was left flushed with concentrated heparin solution. Sponge needle as the counts were correct. The patient was taken to recovery in good condition.  Kharon Hixon T  09/12/2015

## 2015-09-12 NOTE — Discharge Instructions (Signed)
    PORT-A-CATH: POST OP INSTRUCTIONS  Always review your discharge instruction sheet given to you by the facility where your surgery was performed.   1. A prescription for pain medication may be given to you upon discharge. Take your pain medication as prescribed, if needed. If narcotic pain medicine is not needed, then you make take acetaminophen (Tylenol) or ibuprofen (Advil) as needed.  2. Take your usually prescribed medications unless otherwise directed. 3. If you need a refill on your pain medication, please contact our office. All narcotic pain medicine now requires a paper prescription.  Phoned in and fax refills are no longer allowed by law.  Prescriptions will not be filled after 5 pm or on weekends.  4. You should follow a light diet for the remainder of the day after your procedure. 5. Most patients will experience some mild swelling and/or bruising in the area of the incision. It may take several days to resolve. 6. It is common to experience some constipation if taking pain medication after surgery. Increasing fluid intake and taking a stool softener (such as Colace) will usually help or prevent this problem from occurring. A mild laxative (Milk of Magnesia or Miralax) should be taken according to package directions if there are no bowel movements after 48 hours.  7. Unless discharge instructions indicate otherwise, you may remove your bandages 48 hours after surgery, and you may shower at that time. You may have steri-strips (small white skin tapes) in place directly over the incision.  These strips should be left on the skin for 7-10 days.  If your surgeon used Dermabond (skin glue) on the incision, you may shower in 24 hours.  The glue will flake off over the next 2-3 weeks.  8. If your port is left accessed at the end of surgery (needle left in port), the dressing cannot get wet and should only by changed by a healthcare professional. When the port is no longer accessed (when the  needle has been removed), follow step 7.   9. ACTIVITIES:  Limit activity involving your arms for the next 72 hours. Do no strenuous exercise or activity for 1 week. You may drive when you are no longer taking prescription pain medication, you can comfortably wear a seatbelt, and you can maneuver your car. 10.You may need to see your doctor in the office for a follow-up appointment.  Please       check with your doctor.  11.When you receive a new Port-a-Cath, you will get a product guide and        ID card.  Please keep them in case you need them.  WHEN TO CALL YOUR DOCTOR ([phone]): 1. Fever over 101.0 2. Chills 3. Continued bleeding from incision 4. Increased redness and tenderness at the site 5. Shortness of breath, difficulty breathing   The clinic staff is available to answer your questions during regular business hours. Please don't hesitate to call and ask to speak to one of the nurses or medical assistants for clinical concerns. If you have a medical emergency, go to the nearest emergency room or call 911.  A surgeon from [facility] is always on call at the hospital.     For further information, please visit [email or web address]      

## 2015-09-12 NOTE — H&P (Signed)
Cindy Bradshaw 09/01/2015 10:13 AM Location: [facility] Patient #: [id number] DOB: [birth year] Married / Language: English / Race: Black or African American Female   History of Present Illness Marland Kitchen T. Vinessa Macconnell MD; 09/01/2015 10:55 AM) Patient words: Evaluate left breast cancer.  The patient is a 45 year old female who presents with breast cancer. Patint is a 45 YO premenopausal female referred by Dr. Luberta Robertson for evaluation of recently diagnosed carcinoma of the left breast. The patient first noted a tender lump in her upper outer left breast about 6 weeks ago. She had an appointment with her primary physician and just kept this. From there she was referred through her GYN physician to the breast center for evaluation. Subsequent imaging included diagnostic mamogram showing a poorly defined oval mass in the posterior upper outer left breast and ultrasound showing a 3.1 x 2.4 cm irregular hypoechoic mass at the 1:30 o'clock position of the left breast 20 cm from the nipple. Ultrasound of the axilla was negative.. An ultrasound guided breast biopsy was performed on August 17, 2015 with pathology revealing invasive ductal carcinoma of the breast. She is seen now in the office for initial treatment planning. She has experienced some pain around the lump and some intermittent localized swelling in that area of her breast. She does not have a personal history of any previous breast problems. Family history significant only for maternal grandmother with breast cancer  Findings at that time were the following: Tumor size: 3.1 cm Tumor grade: 3, Ki-67 90% Estrogen Receptor: Negative Progesterone Receptor: Negative Her-2 neu: Negative Lymph node status: Negative    Other Problems Ivor Costa, CMA; 09/01/2015 10:14 AM) Asthma Breast Cancer Gastroesophageal Reflux Disease Lump In Breast  Past Surgical History Ivor Costa, [place]; 09/01/2015 10:14  AM) Cesarean Section - Multiple  Diagnostic Studies History Ivor Costa, CMA; 09/01/2015 10:14 AM) Mammogram within last year  Allergies Ivor Costa, CMA; 09/01/2015 10:17 AM) Dilaudid *ANALGESICS - OPIOID* Itching. Contrast Media Ready-Box *MEDICAL DEVICES AND SUPPLIES* Itching. Percocet *ANALGESICS - OPIOID* Toradol *ANALGESICS - ANTI-INFLAMMATORY*  Medication History Ivor Costa, CMA; 09/01/2015 10:16 AM) ProAir HFA (108 (90 Base)MCG/ACT Aerosol Soln, Inhalation) Active. SUMAtriptan Succinate (100MG Tablet, Oral) Active. Medications Reconciled  Social History Ivor Costa, CMA; 09/01/2015 10:14 AM) Alcohol use Occasional alcohol use. Caffeine use Carbonated beverages, Coffee, Tea. Tobacco use Never smoker.  Family History Ivor Costa, [place]; 09/01/2015 10:14 AM) Arthritis Father. Depression Brother. Heart disease in female family member before age 58 Heart disease in female family member before age 75 Hypertension Brother, Sister. Migraine Headache Sister.  Pregnancy / Birth History Ivor Costa, [place]; 09/01/2015 10:14 AM) Age at menarche 83 years. Gravida 3 Maternal age 26-25 Para 2 Regular periods    Review of Systems Ivor Costa CMA; 09/01/2015 10:14 AM) General Present- Fatigue. Not Present- Appetite Loss, Chills, Fever, Night Sweats, Weight Gain and Weight Loss. HEENT Not Present- Earache, Hearing Loss, Hoarseness, Nose Bleed, Oral Ulcers, Ringing in the Ears, Seasonal Allergies, Sinus Pain, Sore Throat, Visual Disturbances, Wears glasses/contact lenses and Yellow Eyes. Respiratory Not Present- Bloody sputum, Chronic Cough, Difficulty Breathing, Snoring and Wheezing. Breast Present- Breast Mass and Breast Pain. Not Present- Nipple Discharge and Skin Changes. Cardiovascular Not Present- Chest Pain, Difficulty Breathing Lying Down, Leg Cramps, Palpitations, Rapid Heart Rate, Shortness of Breath and Swelling of  Extremities. Gastrointestinal Not Present- Abdominal Pain, Bloating, Bloody Stool, Change in Bowel Habits, Chronic diarrhea, Constipation, Difficulty Swallowing, Excessive gas, Gets full quickly at meals, Hemorrhoids, Indigestion, Nausea, Rectal  Pain and Vomiting. Musculoskeletal Not Present- Back Pain, Joint Pain, Joint Stiffness, Muscle Pain, Muscle Weakness and Swelling of Extremities. Neurological Not Present- Decreased Memory, Fainting, Headaches, Numbness, Seizures, Tingling, Tremor, Trouble walking and Weakness. Psychiatric Not Present- Anxiety, Bipolar, Change in Sleep Pattern, Depression, Fearful and Frequent crying.  Vitals Ivor Costa CMA; 09/01/2015 10:14 AM) 09/01/2015 10:14 AM Weight: 238.6 lb Height: 65.5in Body Surface Area: 2.14 m Body Mass Index: 39.1 kg/m  Temp.: 36F(Temporal)  Pulse: 72 (Regular)  Resp.: 18 (Unlabored)  BP: 140/88 (Sitting, Left Arm, Standard)       Physical Exam Marland Kitchen T. Akeel Reffner MD; 09/01/2015 10:51 AM) The physical exam findings are as follows: Note:General: Alert, obese African-American female, in no distress Skin: Warm and dry without rash or infection. HEENT: No palpable masses or thyromegaly. Sclera nonicteric. Pupils equal round and reactive. Lymph nodes: No cervical, supraclavicular, or inguinal nodes palpable. Breasts: Large breasts bilaterally. In the upper outer left breast toward the axilla is a tender easily palpable at least 3 cm indistinct mass. There is about a 2 cm area of thickening very discrete on the left areola. Lungs: Breath sounds clear and equal. No wheezing or increased work of breathing. Cardiovascular: Regular rate and rhythm without murmer. No JVD or edema. Peripheral pulses intact. No carotid bruits. Abdomen: Nondistended. Soft and nontender. No masses palpable. No organomegaly. No palpable hernias. Extremities: No edema or joint swelling or deformity. No chronic venous stasis  changes. Neurologic: Alert and fully oriented. Gait normal. No focal weakness. Psychiatric: Normal mood and affect. Thought content appropriate with normal judgement and insight     BREAST CANCER OF UPPER-OUTER QUADRANT OF LEFT FEMALE BREAST (C50.412) Impression: 45 year old female with a new diagnosis of cancer of the left breast, upper outer quadrant. Clinical stage 1 c, T2 N 0 M0, triple negative. I discussed with the patient and her husband today initial surgical treatment options. We discussed options of breast conservation with lumpectomy or total mastectomy and sentinal lymph node biopsy/dissection. Options for reconstruction were discussed. I believe she would be an excellent candidate for neoadjuvant chemotherapy. She will be referred immediately for medical oncology. Also preoperative MRI. Genetic counseling. She will need a Port-A-Cath. I discussed this procedure in detail including its nature and indications as well as risks of anesthetic complications, bleeding, infection, pneumothorax and delayed complications including catheter occlusion or infection and DVT. Current Plans MRI, BOTH BREASTS ([id number]) Referred to Genetic Counseling, for evaluation and follow up (Medical Genetics). Routine. Referred to Oncology, for evaluation and follow up (Oncology). Urgent. Port-A-Cath placement under general anesthesia as an outpatient  You are being scheduled for surgery - Our schedulers will call you.  You should hear from our office's scheduling department within 5 working days about the location, date, and time of surgery. We try to make accommodations for patient's preferences in scheduling surgery, but sometimes the OR schedule or the surgeon's schedule prevents [place] from making those accommodations.  If you have not heard from our office [phone]) in 5 working days, call the office and ask for your surgeon's nurse.  If you have other questions about your diagnosis, plan, or surgery,  call the office and ask for your surgeon's nurse.

## 2015-09-12 NOTE — Transfer of Care (Signed)
Immediate Anesthesia Transfer of Care Note  Patient: Cindy Bradshaw  Procedure(s) Performed: Procedure(s): INSERTION PORT-A-CATH ([name])  Patient Location: PACU  Anesthesia Type:General  Level of Consciousness: sedated, patient cooperative and responds to stimulation  Airway & Oxygen Therapy: Patient Spontanous Breathing and Patient connected to face mask oxygen  Post-op Assessment: Report given to RN and Post -op Vital signs reviewed and stable  Post vital signs: Reviewed and stable  Last Vitals:  Filed Vitals:   09/12/15 0515  BP: 136/84  Pulse: 76  Temp: 36.6 C  Resp: 18    Complications: No apparent anesthesia complications

## 2015-09-12 NOTE — Anesthesia Postprocedure Evaluation (Signed)
Anesthesia Post Note  Patient: Cindy Bradshaw  Procedure(s) Performed: Procedure(s) (LRB): INSERTION PORT-A-CATH ([name])  Patient location during evaluation: PACU Anesthesia Type: General Level of consciousness: sedated Pain management: satisfactory to patient Vital Signs Assessment: post-procedure vital signs reviewed and stable Respiratory status: spontaneous breathing Cardiovascular status: stable Anesthetic complications: no    Last Vitals:  Filed Vitals:   09/12/15 0933 09/12/15 1005  BP: 133/86 143/93  Pulse: 78 78  Temp: 36.3 C   Resp: 15 16    Last Pain:  Filed Vitals:   09/12/15 1006  PainSc: 2                  Rhegan Trunnell EDWARD

## 2015-09-12 NOTE — Anesthesia Procedure Notes (Signed)
Procedure Name: LMA Insertion Performed by: Gean Maidens Pre-anesthesia Checklist: Patient identified, Emergency Drugs available, Suction available, Patient being monitored and Timeout performed Patient Re-evaluated:Patient Re-evaluated prior to inductionOxygen Delivery Method: Circle system utilized Preoxygenation: Pre-oxygenation with 100% oxygen Intubation Type: IV induction Ventilation: Mask ventilation without difficulty LMA: LMA inserted LMA Size: 4.0 Placement Confirmation: positive ETCO2 and CO2 detector Tube secured with: Tape Dental Injury: Teeth and Oropharynx as per pre-operative assessment

## 2015-09-12 NOTE — Interval H&P Note (Signed)
History and Physical Interval Note:  09/12/2015 7:15 AM  Cindy Bradshaw  has presented today for surgery, with the diagnosis of cancer left breast  The various methods of treatment have been discussed with the patient and family. After consideration of risks, benefits and other options for treatment, the patient has consented to  Procedure(s): INSERTION PORT-A-CATH ([name]) as a surgical intervention .  The patient's history has been reviewed, patient examined, no change in status, stable for surgery.  I have reviewed the patient's chart and labs.  Questions were answered to the patient's satisfaction.     [name]

## 2015-09-13 ENCOUNTER — Ambulatory Visit (HOSPITAL_COMMUNITY)
Admission: RE | Admit: 2015-09-13 | Discharge: 2015-09-13 | Disposition: A | Discharge status: Home / Self Care | Payer: Federal, State, Local not specified - PPO | Source: Ambulatory Visit | Attending: Hematology and Oncology | Admitting: Hematology and Oncology

## 2015-09-13 ENCOUNTER — Encounter: Payer: Self-pay | Admitting: *Deleted

## 2015-09-13 ENCOUNTER — Other Ambulatory Visit: Payer: Federal, State, Local not specified - PPO

## 2015-09-13 ENCOUNTER — Telehealth: Payer: Self-pay | Admitting: *Deleted

## 2015-09-13 DIAGNOSIS — C50412 Malignant neoplasm of upper-outer quadrant of left female breast: Secondary | ICD-10-CM | POA: Insufficient documentation

## 2015-09-13 NOTE — Telephone Encounter (Signed)
Voicemail:  "I'm calling for [name] in reference to a note for my Chemotherapy Treatment starting for work.  Please call [phone] or [phone]."

## 2015-09-13 NOTE — Progress Notes (Signed)
Echocardiogram 2D Echocardiogram has been performed.  Cindy Bradshaw 09/13/2015, 12:09 PM

## 2015-09-14 ENCOUNTER — Ambulatory Visit
Admission: RE | Admit: 2015-09-14 | Discharge: 2015-09-14 | Disposition: A | Discharge status: Home / Self Care | Payer: Federal, State, Local not specified - PPO | Source: Ambulatory Visit | Attending: General Surgery | Admitting: General Surgery

## 2015-09-14 DIAGNOSIS — C50412 Malignant neoplasm of upper-outer quadrant of left female breast: Secondary | ICD-10-CM

## 2015-09-14 MED ORDER — GADOBENATE DIMEGLUMINE 529 MG/ML IV SOLN
20.0000 mL | Freq: Once | INTRAVENOUS | Status: AC | PRN
  Administered 2015-09-14: 20 mL via INTRAVENOUS

## 2015-09-15 ENCOUNTER — Encounter: Payer: Self-pay | Admitting: *Deleted

## 2015-09-16 ENCOUNTER — Emergency Department (HOSPITAL_COMMUNITY): Payer: Federal, State, Local not specified - PPO

## 2015-09-16 ENCOUNTER — Encounter (HOSPITAL_COMMUNITY): Payer: Self-pay | Admitting: Emergency Medicine

## 2015-09-16 ENCOUNTER — Emergency Department (HOSPITAL_COMMUNITY)
Admission: EM | Admit: 2015-09-16 | Discharge: 2015-09-17 | Disposition: A | Discharge status: Home / Self Care | Payer: Federal, State, Local not specified - PPO | Attending: Emergency Medicine | Admitting: Emergency Medicine

## 2015-09-16 DIAGNOSIS — R197 Diarrhea, unspecified: Secondary | ICD-10-CM | POA: Diagnosis present

## 2015-09-16 DIAGNOSIS — J45909 Unspecified asthma, uncomplicated: Secondary | ICD-10-CM | POA: Diagnosis not present

## 2015-09-16 DIAGNOSIS — Z8719 Personal history of other diseases of the digestive system: Secondary | ICD-10-CM | POA: Insufficient documentation

## 2015-09-16 DIAGNOSIS — Z7952 Long term (current) use of systemic steroids: Secondary | ICD-10-CM | POA: Diagnosis not present

## 2015-09-16 DIAGNOSIS — A084 Viral intestinal infection, unspecified: Secondary | ICD-10-CM | POA: Diagnosis not present

## 2015-09-16 DIAGNOSIS — M1990 Unspecified osteoarthritis, unspecified site: Secondary | ICD-10-CM | POA: Diagnosis not present

## 2015-09-16 DIAGNOSIS — Z79899 Other long term (current) drug therapy: Secondary | ICD-10-CM | POA: Insufficient documentation

## 2015-09-16 DIAGNOSIS — Z853 Personal history of malignant neoplasm of breast: Secondary | ICD-10-CM | POA: Diagnosis not present

## 2015-09-16 DIAGNOSIS — R51 Headache: Secondary | ICD-10-CM | POA: Diagnosis not present

## 2015-09-16 LAB — I-STAT CG4 LACTIC ACID, ED: LACTIC ACID, VENOUS: 1.29 mmol/L (ref 0.5–2.0)

## 2015-09-16 NOTE — ED Notes (Signed)
Patient presents for fever, chills, diarrhea. Patient is suppose to start chemo on 1/16.

## 2015-09-17 LAB — COMPREHENSIVE METABOLIC PANEL
ALT: 33 U/L (ref 14–54)
AST: 22 U/L (ref 15–41)
Albumin: 3.4 g/dL — ABNORMAL LOW (ref 3.5–5.0)
Alkaline Phosphatase: 61 U/L (ref 38–126)
Anion gap: 8 (ref 5–15)
BUN: 12 mg/dL (ref 6–20)
CHLORIDE: 102 mmol/L (ref 101–111)
CO2: 24 mmol/L (ref 22–32)
Calcium: 9.6 mg/dL (ref 8.9–10.3)
Creatinine, Ser: 0.64 mg/dL (ref 0.44–1.00)
Glucose, Bld: 111 mg/dL — ABNORMAL HIGH (ref 65–99)
POTASSIUM: 3.4 mmol/L — AB (ref 3.5–5.1)
Sodium: 134 mmol/L — ABNORMAL LOW (ref 135–145)
Total Bilirubin: 0.5 mg/dL (ref 0.3–1.2)
Total Protein: 7.2 g/dL (ref 6.5–8.1)

## 2015-09-17 LAB — CBC WITH DIFFERENTIAL/PLATELET
Basophils Absolute: 0 10*3/uL (ref 0.0–0.1)
Basophils Relative: 0 %
EOS PCT: 1 %
Eosinophils Absolute: 0.1 10*3/uL (ref 0.0–0.7)
HCT: 32 % — ABNORMAL LOW (ref 36.0–46.0)
Hemoglobin: 9.9 g/dL — ABNORMAL LOW (ref 12.0–15.0)
LYMPHS ABS: 2.4 10*3/uL (ref 0.7–4.0)
LYMPHS PCT: 20 %
MCH: 25.8 pg — AB (ref 26.0–34.0)
MCHC: 30.9 g/dL (ref 30.0–36.0)
MCV: 83.3 fL (ref 78.0–100.0)
MONOS PCT: 14 %
Monocytes Absolute: 1.7 10*3/uL — ABNORMAL HIGH (ref 0.1–1.0)
Neutro Abs: 7.8 10*3/uL — ABNORMAL HIGH (ref 1.7–7.7)
Neutrophils Relative %: 65 %
PLATELETS: 423 10*3/uL — AB (ref 150–400)
RBC: 3.84 MIL/uL — AB (ref 3.87–5.11)
RDW: 15.8 % — ABNORMAL HIGH (ref 11.5–15.5)
WBC: 11.9 10*3/uL — AB (ref 4.0–10.5)

## 2015-09-17 MED ORDER — ONDANSETRON 4 MG PO TBDP: 4.0000 mg | ORAL_TABLET | Freq: Three times a day (TID) | ORAL | Status: DC | PRN

## 2015-09-17 MED ORDER — ACETAMINOPHEN 500 MG PO TABS
1000.0000 mg | ORAL_TABLET | Freq: Once | ORAL | Status: AC
  Administered 2015-09-17: 1000 mg via ORAL
  Filled 2015-09-17: qty 2

## 2015-09-17 MED ORDER — ONDANSETRON 4 MG PO TBDP
4.0000 mg | ORAL_TABLET | Freq: Once | ORAL | Status: AC
  Administered 2015-09-17: 4 mg via ORAL
  Filled 2015-09-17: qty 1

## 2015-09-17 NOTE — ED Provider Notes (Signed)
CSN: [id number]     Arrival date & time 09/16/15  2247 History  By signing my name below, I, Irene Pap, attest that this documentation has been prepared under the direction and in the presence of Leo Grosser, MD. Electronically Signed: Irene Pap, ED Scribe. 09/17/2015. 1:52 AM.  Chief Complaint  Patient presents with  . Cancer Patient   . Fever  . Diarrhea   Patient is a 45 y.o. female presenting with fever and diarrhea. The history is provided by the patient. No language interpreter was used.  Fever Temp source:  Oral Severity:  Moderate Onset quality:  Sudden Timing:  Constant Progression:  Worsening Chronicity:  New Ineffective treatments:  Acetaminophen Associated symptoms: chills, cough, diarrhea, headaches, myalgias and nausea   Associated symptoms: no vomiting   Risk factors: hx of cancer   Diarrhea Associated symptoms: abdominal pain, chills, fever, headaches and myalgias   Associated symptoms: no vomiting   HPI Comments: Kischa K. Hemperly is a 45 y.o. Female with a hx of breast cancer, asthma, and GERD who presents to the Emergency Department complaining of fever tmax 101.5 F onset 1 day ago. Pt reports associated chills, cough, diarrhea, cramping abdominal pain, nausea, generalized myalgias, and headache. She reports taking medication at home to no relief. Pt is due to start chemo on 09/19/15. She denies vomiting.   Past Medical History  Diagnosis Date  . Asthma   . Breast cancer ([place])   . Arthritis   . Back disorder     degenerative disk disease  . Pap smear for cervical cancer screening 08/12/15  . Mammogram abnormal 08/24/15    first  . GERD (gastroesophageal reflux disease)    Past Surgical History  Procedure Laterality Date  . Cholecystectomy    . Cesarean section    . Portacath placement [name] 09/12/2015    Procedure: INSERTION PORT-A-CATH;  Surgeon: Excell Seltzer, MD;  Location: [facility];  Service: General;  Laterality: [name];   Family History   Problem Relation Age of Onset  . Lung cancer Mother   . Brain cancer Mother   . Bone cancer Maternal Uncle   . Breast cancer Maternal Grandmother   . Stomach cancer Paternal Grandfather    Social History  Substance Use Topics  . Smoking status: Never Smoker   . Smokeless tobacco: Never Used  . Alcohol Use: 0.0 oz/week    0 Standard drinks or equivalent per week     Comment: occassional   OB History    No data available     Review of Systems  Constitutional: Positive for fever and chills.  Respiratory: Positive for cough.   Gastrointestinal: Positive for nausea, abdominal pain and diarrhea. Negative for vomiting.  Musculoskeletal: Positive for myalgias.  Neurological: Positive for headaches.  All other systems reviewed and are negative.  Allergies  Dilaudid; Ivp dye; Percocet; and Toradol  Home Medications   Prior to Admission medications   Medication Sig Start Date End Date Taking? Authorizing Provider  dexamethasone (DECADRON) 4 MG tablet Take 1 tablet (4 mg total) by mouth 2 (two) times daily. Take 1 tablets by mouth once a day on the day after chemotherapy and then take 1 tablets two times a day for 2 days. Take with food. 09/07/15   Nicholas Lose, MD  HYDROcodone-acetaminophen (NORCO) 10-325 MG tablet Take 1-2 tablets by mouth every 4 (four) hours as needed (pain). 09/12/15   Excell Seltzer, MD  lidocaine-prilocaine (EMLA) cream Apply to affected area once 09/07/15   [facility]  Lindi Adie, MD  LORazepam (ATIVAN) 0.5 MG tablet Take 1 tablet (0.5 mg total) by mouth at bedtime. 09/07/15   Nicholas Lose, MD  ondansetron (ZOFRAN) 8 MG tablet Take 1 tablet (8 mg total) by mouth 2 (two) times daily as needed. Start on the third day after chemotherapy. 09/07/15   Nicholas Lose, MD  PROAIR HFA 108 (90 BASE) MCG/ACT inhaler Inhale 2 puffs into the lungs every 4 (four) hours as needed. For shortness of breath 03/28/15   Historical Provider, MD  prochlorperazine (COMPAZINE) 10 MG tablet Take 1 tablet  (10 mg total) by mouth every 6 (six) hours as needed (Nausea or vomiting). 09/07/15   Nicholas Lose, MD  SUMAtriptan (IMITREX) 100 MG tablet Take 100 mg by mouth 2 (two) times daily as needed. For migraines 03/30/15   Historical Provider, MD   BP 116/75 mmHg  Pulse 106  Temp(Src) 99.2 F (37.3 C) (Oral)  Resp 18  SpO2 97%  LMP 08/25/2015 Physical Exam  Constitutional: She is oriented to person, place, and time. She appears well-developed and well-nourished. No distress.  HENT:  Head: Normocephalic and atraumatic.  Mouth/Throat: Oropharynx is clear and moist. No oropharyngeal exudate.  Trachea midline  Eyes: Conjunctivae and EOM are normal. Pupils are equal, round, and reactive to light.  Neck: Trachea normal and normal range of motion. Neck supple. No JVD present. Carotid bruit is not present.  Cardiovascular: Normal rate and regular rhythm.  Exam reveals no gallop and no friction rub.   No murmur heard. Pulmonary/Chest: Effort normal and breath sounds normal. No stridor. She has no wheezes. She has no rales.  Abdominal: Soft. Bowel sounds are normal. She exhibits no mass. There is no tenderness. There is no rebound and no guarding.  Musculoskeletal: Normal range of motion.  Lymphadenopathy:    She has no cervical adenopathy.  Neurological: She is alert and oriented to person, place, and time. She has normal reflexes. No cranial nerve deficit. She exhibits normal muscle tone. Coordination normal.  Cranial nerves 2-12 intact  Skin: Skin is warm and dry. She is not diaphoretic.  Psychiatric: She has a normal mood and affect. Her behavior is normal.    ED Course  Procedures (including critical care time) DIAGNOSTIC STUDIES: Oxygen Saturation is 97% on RA, normal by my interpretation.    COORDINATION OF CARE: 1:51 AM-Discussed treatment plan which includes labs and x-ray with pt at bedside and pt agreed to plan.    Labs Review Labs Reviewed  COMPREHENSIVE METABOLIC PANEL - Abnormal;  Notable for the following:    Sodium 134 (*)    Potassium 3.4 (*)    Glucose, Bld 111 (*)    Albumin 3.4 (*)    All other components within normal limits  CBC WITH DIFFERENTIAL/PLATELET - Abnormal; Notable for the following:    WBC 11.9 (*)    RBC 3.84 (*)    Hemoglobin 9.9 (*)    HCT 32.0 (*)    MCH 25.8 (*)    RDW 15.8 (*)    Platelets 423 (*)    Neutro Abs 7.8 (*)    Monocytes Absolute 1.7 (*)    All other components within normal limits  I-STAT CG4 LACTIC ACID, ED    Imaging Review Dg Chest 2 View  09/16/2015  CLINICAL DATA:  Acute onset of fever, chills and diarrhea. Initial encounter. EXAM: CHEST  2 VIEW COMPARISON:  Chest radiograph from 09/12/2015 FINDINGS: The lungs are well-aerated and clear. There is no evidence of focal opacification,  pleural effusion or pneumothorax. The heart is borderline normal in size. A right-sided chest port is noted ending about the mid SVC. No acute osseous abnormalities are seen. Clips are noted within the right upper quadrant, reflecting prior cholecystectomy. IMPRESSION: No acute cardiopulmonary process seen. Electronically Signed   By: Garald Balding M.D.   On: 09/16/2015 23:57   I have personally reviewed and evaluated these images and lab results as part of my medical decision-making.   EKG Interpretation None      MDM   Final diagnoses:  Viral gastroenteritis    45 year old female with history of breast cancer, not currently on immunosuppressive drugs or chemotherapy presents with typical viral symptoms of intermittent fever, chills, diarrhea and generally feeling unwell with some myalgias. No significant vital sign abnormalities, no abdominal tenderness or other concerning physical exam findings currently. Recommended supportive therapy with anti-medics, Pepto-Bismol and oral rehydration. I asked the patient to discuss her current symptoms with her oncologist prior to initiating any chemotherapeutic agents.  I personally performed  the services described in this documentation, which was scribed in my presence. The recorded information has been reviewed and is accurate.     Leo Grosser, MD 09/17/15 [phone]

## 2015-09-17 NOTE — Discharge Instructions (Signed)

## 2015-09-19 ENCOUNTER — Telehealth: Payer: Self-pay | Admitting: *Deleted

## 2015-09-19 ENCOUNTER — Other Ambulatory Visit (HOSPITAL_BASED_OUTPATIENT_CLINIC_OR_DEPARTMENT_OTHER): Payer: Federal, State, Local not specified - PPO

## 2015-09-19 ENCOUNTER — Encounter: Payer: Self-pay | Admitting: Hematology and Oncology

## 2015-09-19 ENCOUNTER — Ambulatory Visit (HOSPITAL_BASED_OUTPATIENT_CLINIC_OR_DEPARTMENT_OTHER): Payer: Federal, State, Local not specified - PPO

## 2015-09-19 ENCOUNTER — Encounter: Payer: Self-pay | Admitting: *Deleted

## 2015-09-19 ENCOUNTER — Ambulatory Visit (HOSPITAL_BASED_OUTPATIENT_CLINIC_OR_DEPARTMENT_OTHER): Payer: Federal, State, Local not specified - PPO | Admitting: Hematology and Oncology

## 2015-09-19 ENCOUNTER — Other Ambulatory Visit: Payer: Self-pay | Admitting: General Surgery

## 2015-09-19 VITALS — BP 137/86 | HR 90 | Temp 98.6°F | Resp 20 | Wt 237.2 lb

## 2015-09-19 DIAGNOSIS — Z5111 Encounter for antineoplastic chemotherapy: Secondary | ICD-10-CM | POA: Diagnosis not present

## 2015-09-19 DIAGNOSIS — D649 Anemia, unspecified: Secondary | ICD-10-CM | POA: Diagnosis not present

## 2015-09-19 DIAGNOSIS — C50412 Malignant neoplasm of upper-outer quadrant of left female breast: Secondary | ICD-10-CM | POA: Diagnosis not present

## 2015-09-19 DIAGNOSIS — N6320 Unspecified lump in the left breast, unspecified quadrant: Secondary | ICD-10-CM

## 2015-09-19 DIAGNOSIS — N6310 Unspecified lump in the right breast, unspecified quadrant: Secondary | ICD-10-CM

## 2015-09-19 LAB — CBC WITH DIFFERENTIAL/PLATELET
BASO%: 1 % (ref 0.0–2.0)
BASOS ABS: 0.1 10*3/uL (ref 0.0–0.1)
EOS%: 2.1 % (ref 0.0–7.0)
Eosinophils Absolute: 0.1 10*3/uL (ref 0.0–0.5)
HCT: 31 % — ABNORMAL LOW (ref 34.8–46.6)
HGB: 9.8 g/dL — ABNORMAL LOW (ref 11.6–15.9)
LYMPH%: 29.1 % (ref 14.0–49.7)
MCH: 25.4 pg (ref 25.1–34.0)
MCHC: 31.6 g/dL (ref 31.5–36.0)
MCV: 80.1 fL (ref 79.5–101.0)
MONO#: 0.5 10*3/uL (ref 0.1–0.9)
MONO%: 7.7 % (ref 0.0–14.0)
NEUT#: 4.2 10*3/uL (ref 1.5–6.5)
NEUT%: 60.1 % (ref 38.4–76.8)
Platelets: 391 10*3/uL (ref 145–400)
RBC: 3.87 10*6/uL (ref 3.70–5.45)
RDW: 15.7 % — AB (ref 11.2–14.5)
WBC: 7 10*3/uL (ref 3.9–10.3)
lymph#: 2 10*3/uL (ref 0.9–3.3)

## 2015-09-19 LAB — COMPREHENSIVE METABOLIC PANEL
ALT: 35 U/L (ref 0–55)
AST: 24 U/L (ref 5–34)
Albumin: 3.3 g/dL — ABNORMAL LOW (ref 3.5–5.0)
Alkaline Phosphatase: 65 U/L (ref 40–150)
Anion Gap: 6 mEq/L (ref 3–11)
BUN: 12.5 mg/dL (ref 7.0–26.0)
CHLORIDE: 108 meq/L (ref 98–109)
CO2: 22 mEq/L (ref 22–29)
Calcium: 10 mg/dL (ref 8.4–10.4)
Creatinine: 0.8 mg/dL (ref 0.6–1.1)
EGFR: >90 mL/min/{1.73_m2} (ref >90)
GLUCOSE: 94 mg/dL (ref 70–140)
POTASSIUM: 4.4 meq/L (ref 3.5–5.1)
SODIUM: 136 meq/L (ref 136–145)
Total Bilirubin: 0.32 mg/dL (ref 0.20–1.20)
Total Protein: 7.7 g/dL (ref 6.4–8.3)

## 2015-09-19 LAB — TSH: TSH: 0.859 m(IU)/L (ref 0.308–3.960)

## 2015-09-19 LAB — IRON AND TIBC
%SAT: 7 % — AB (ref 21–57)
Iron: 27 ug/dL — ABNORMAL LOW (ref 41–142)
TIBC: 400 ug/dL (ref 236–444)
UIBC: 373 ug/dL (ref 120–384)

## 2015-09-19 LAB — LACTATE DEHYDROGENASE: LDH: 201 U/L (ref 125–245)

## 2015-09-19 LAB — RETICULOCYTES (CHCC)
IMMATURE RETIC FRACT: 14.6 % — AB (ref 1.60–10.00)
RBC: 3.85 10*6/uL (ref 3.70–5.45)
RETIC %: 1.56 % (ref 0.70–2.10)
Retic Ct Abs: 60.06 10*3/uL (ref 33.70–90.70)

## 2015-09-19 LAB — FERRITIN: Ferritin: 23 ng/ml (ref 9–269)

## 2015-09-19 MED ORDER — SODIUM CHLORIDE 0.9 % IV SOLN
Freq: Once | INTRAVENOUS | Status: AC
  Administered 2015-09-19: 14:00:00 via INTRAVENOUS
  Filled 2015-09-19: qty 5

## 2015-09-19 MED ORDER — CYCLOPHOSPHAMIDE CHEMO INJECTION 1 GM
600.0000 mg/m2 | Freq: Once | INTRAMUSCULAR | Status: AC
  Administered 2015-09-19: 1340 mg via INTRAVENOUS
  Filled 2015-09-19: qty 67

## 2015-09-19 MED ORDER — SODIUM CHLORIDE 0.9 % IV SOLN
Freq: Once | INTRAVENOUS | Status: AC
  Administered 2015-09-19: 13:00:00 via INTRAVENOUS

## 2015-09-19 MED ORDER — SODIUM CHLORIDE 0.9 % IJ SOLN
10.0000 mL | INTRAMUSCULAR | Status: DC | PRN
Start: 2015-09-19 — End: 2015-09-19
  Administered 2015-09-19: 10 mL
  Filled 2015-09-19: qty 10

## 2015-09-19 MED ORDER — PALONOSETRON HCL INJECTION 0.25 MG/5ML
0.2500 mg | Freq: Once | INTRAVENOUS | Status: AC
  Administered 2015-09-19: 0.25 mg via INTRAVENOUS

## 2015-09-19 MED ORDER — DOXORUBICIN HCL CHEMO IV INJECTION 2 MG/ML
60.0000 mg/m2 | Freq: Once | INTRAVENOUS | Status: AC
Start: 2015-09-19 — End: 2015-09-19
  Administered 2015-09-19: 134 mg via INTRAVENOUS
  Filled 2015-09-19: qty 67

## 2015-09-19 MED ORDER — PEGFILGRASTIM 6 MG/0.6ML ~~LOC~~ PSKT
6.0000 mg | PREFILLED_SYRINGE | Freq: Once | SUBCUTANEOUS | Status: AC
  Administered 2015-09-19: 6 mg via SUBCUTANEOUS
  Filled 2015-09-19: qty 0.6

## 2015-09-19 MED ORDER — HEPARIN SOD (PORK) LOCK FLUSH 100 UNIT/ML IV SOLN
500.0000 [IU] | Freq: Once | INTRAVENOUS | Status: AC | PRN
  Administered 2015-09-19: 500 [IU]
  Filled 2015-09-19: qty 5

## 2015-09-19 MED ORDER — PALONOSETRON HCL INJECTION 0.25 MG/5ML
INTRAVENOUS | Status: AC
  Filled 2015-09-19: qty 5

## 2015-09-19 NOTE — Progress Notes (Signed)
Patient Care Team: [name], MD as PCP - General (Internal Medicine)   SUMMARY OF ONCOLOGIC HISTORY:   Breast cancer of upper-outer quadrant of left female breast ([place])   08/17/2015 Initial Diagnosis left breast biopsy 1:30 position: Invasive ductal carcinoma, grade 3, ER 0%, PR 0%, HER-2 negative ratio 1.48, Ki-67 90%, 3.1 cm tumor, axilla negative, T2 N0 stage II a clinical stage   09/19/2015 -  Neo-Adjuvant Chemotherapy Neoadjuvant chemotherapy with dose dense Adriamycin and Cytoxan 4 followed by Taxol and carboplatin weekly 12    CHIEF COMPLIANT: Cycle 1 day 1 dose dense dimensions and Cytoxan  INTERVAL HISTORY: Cindy Bradshaw is a 45 year old with above-mentioned history of left breast cancer who is starting first cycle of neoadjuvant chemotherapy with dose dense a mass and Cytoxan. She had completed chemotherapy counseling and echocardiogram. She also undergone port placement. Echocardiogram revealed an ejection fraction of 55-60%.  REVIEW OF SYSTEMS:   Constitutional: Denies fevers, chills or abnormal weight loss Eyes: Denies blurriness of vision Ears, nose, mouth, throat, and face: Denies mucositis or sore throat Respiratory: Denies cough, dyspnea or wheezes Cardiovascular: Denies palpitation, chest discomfort Gastrointestinal:  Denies nausea, heartburn or change in bowel habits Skin: Denies abnormal skin rashes Lymphatics: Denies new lymphadenopathy or easy bruising Neurological:Denies numbness, tingling or new weaknesses Behavioral/Psych: Mood is stable, no new changes  Extremities: No lower extremity edema Breast: Palpable lump in the left breast with tenderness and pain All other systems were reviewed with the patient and are negative.  I have reviewed the past medical history, past surgical history, social history and family history with the patient and they are unchanged from previous note.  ALLERGIES:  is allergic to dilaudid; ivp dye; percocet;  and toradol.  MEDICATIONS:  Current Outpatient Prescriptions  Medication Sig Dispense Refill  . dexamethasone (DECADRON) 4 MG tablet Take 1 tablet (4 mg total) by mouth 2 (two) times daily. Take 1 tablets by mouth once a day on the day after chemotherapy and then take 1 tablets two times a day for 2 days. Take with food. 30 tablet 1  . HYDROcodone-acetaminophen (NORCO) 10-325 MG tablet Take 1-2 tablets by mouth every 4 (four) hours as needed (pain). 30 tablet 0  . lidocaine-prilocaine (EMLA) cream Apply to affected area once 30 g 3  . LORazepam (ATIVAN) 0.5 MG tablet Take 1 tablet (0.5 mg total) by mouth at bedtime. 30 tablet 0  . ondansetron (ZOFRAN ODT) 4 MG disintegrating tablet Take 1 tablet (4 mg total) by mouth every 8 (eight) hours as needed for nausea or vomiting. 20 tablet 0  . ondansetron (ZOFRAN) 8 MG tablet Take 1 tablet (8 mg total) by mouth 2 (two) times daily as needed. Start on the third day after chemotherapy. 30 tablet 1  . PROAIR HFA 108 (90 BASE) MCG/ACT inhaler Inhale 2 puffs into the lungs every 4 (four) hours as needed. For shortness of breath  3  . prochlorperazine (COMPAZINE) 10 MG tablet Take 1 tablet (10 mg total) by mouth every 6 (six) hours as needed (Nausea or vomiting). 30 tablet 1   No current facility-administered medications for this visit.    PHYSICAL EXAMINATION: ECOG PERFORMANCE STATUS: 1 - Symptomatic but completely ambulatory  Filed Vitals:   09/19/15 1154  BP: 137/86  Pulse: 90  Temp: 98.6 F (37 C)  Resp: 20   Filed Weights   09/19/15 1154  Weight: 237 lb 3.2 oz (107.593 kg)    GENERAL:alert, no distress and comfortable SKIN:  skin color, texture, turgor are normal, no rashes or significant lesions EYES: normal, Conjunctiva are pink and non-injected, sclera clear OROPHARYNX:no exudate, no erythema and lips, buccal mucosa, and tongue normal  NECK: supple, thyroid normal size, non-tender, without nodularity LYMPH:  no palpable  lymphadenopathy in the cervical, axillary or inguinal LUNGS: clear to auscultation and percussion with normal breathing effort HEART: regular rate & rhythm and no murmurs and no lower extremity edema ABDOMEN:abdomen soft, non-tender and normal bowel sounds MUSCULOSKELETAL:no cyanosis of digits and no clubbing  NEURO: alert & oriented x 3 with fluent speech, no focal motor/sensory deficits EXTREMITIES: No lower extremity edema  LABORATORY DATA:  I have reviewed the data as listed   Chemistry      Component Value Date/Time   NA 134* 09/16/2015 2316   K 3.4* 09/16/2015 2316   CL 102 09/16/2015 2316   CO2 24 09/16/2015 2316   BUN 12 09/16/2015 2316   CREATININE 0.64 09/16/2015 2316      Component Value Date/Time   CALCIUM 9.6 09/16/2015 2316   ALKPHOS 61 09/16/2015 2316   AST 22 09/16/2015 2316   ALT 33 09/16/2015 2316   BILITOT 0.5 09/16/2015 2316       Lab Results  Component Value Date   WBC 7.0 09/19/2015   HGB 9.8* 09/19/2015   HCT 31.0* 09/19/2015   MCV 80.1 09/19/2015   PLT 391 09/19/2015   NEUTROABS 4.2 09/19/2015     ASSESSMENT & PLAN:  Breast cancer of upper-outer quadrant of left female breast ([place]) Left breast biopsy 08/17/2015:1:30 position: Invasive ductal carcinoma, grade 3, ER 0%, PR 0%, HER-2 negative ratio 1.48, Ki-67 90%, 3.1 cm tumor, axilla negative, T2 N0 stage II a clinical stage  Treatment plan: 1. Neoadjuvant chemotherapy with dose dense Adriamycin and Cytoxan 4 followed by Taxol and carboplatin weekly 12 2. Followed by surgery 3. Followed by adjuvant radiation -------------------------------------------------------------------------------------------------------------------------------------------------- Current treatment: Cycle 1 day 1 dose dense Adriamycin and Cytoxan Echocardiogram 09/13/2015: EF 55-60% Labs were reviewed. Normocytic anemia: Unclear etiology, I will obtain additional lab work including TSH, serum protein pheresis,  haptoglobin LDH and reticulocyte count for further evaluation. It appears that the patient has been chronically anemic.  Monitoring very closely for chemotherapy toxicities. Return to clinic in 1 week for toxicity check   Orders Placed This Encounter  Procedures  . TSH    Standing Status: Future     Number of Occurrences:      Standing Expiration Date: 10/23/2016  . Iron and TIBC    Standing Status: Future     Number of Occurrences:      Standing Expiration Date: 09/18/2016  . Ferritin    Standing Status: Future     Number of Occurrences:      Standing Expiration Date: 09/18/2016  . Folate, Serum    Standing Status: Future     Number of Occurrences:      Standing Expiration Date: 09/18/2016  . Vitamin B12    Standing Status: Future     Number of Occurrences:      Standing Expiration Date: 09/18/2016  . Erythropoietin    Standing Status: Future     Number of Occurrences:      Standing Expiration Date: 09/18/2016  . Haptoglobin    Standing Status: Future     Number of Occurrences:      Standing Expiration Date: 09/18/2016  . Lactate dehydrogenase (LDH) - [facility]    Standing Status: Future     Number of Occurrences:  Standing Expiration Date: 09/18/2016  . Reticulocytes ([place])    Standing Status: Future     Number of Occurrences:      Standing Expiration Date: 09/18/2016  . Multiple Myeloma Panel (SPEP&IFE w/QIG)    Standing Status: Future     Number of Occurrences:      Standing Expiration Date: 10/23/2016   The patient has a good understanding of the overall plan. she agrees with it. she will call with any problems that may develop before the next visit here.   Rulon Eisenmenger, MD 09/19/2015

## 2015-09-19 NOTE — Patient Instructions (Signed)
[facility] Discharge Instructions for Patients Receiving Chemotherapy  Today you received the following chemotherapy agents Adriamycin and Cytoxan Pegfilgrastim injection What is this medicine? PEGFILGRASTIM (PEG fil gra stim) is a long-acting granulocyte colony-stimulating factor that stimulates the growth of neutrophils, a type of white blood cell important in the body's fight against infection. It is used to reduce the incidence of fever and infection in patients with certain types of cancer who are receiving chemotherapy that affects the bone marrow, and to increase survival after being exposed to high doses of radiation. This medicine may be used for other purposes; ask your health care provider or pharmacist if you have questions. What should I tell my health care provider before I take this medicine? They need to know if you have any of these conditions: -kidney disease -latex allergy -ongoing radiation therapy -sickle cell disease -skin reactions to acrylic adhesives (On-Body Injector only) -an unusual or allergic reaction to pegfilgrastim, filgrastim, other medicines, foods, dyes, or preservatives -pregnant or trying to get pregnant -breast-feeding How should I use this medicine? This medicine is for injection under the skin. If you get this medicine at home, you will be taught how to prepare and give the pre-filled syringe or how to use the On-body Injector. Refer to the patient Instructions for Use for detailed instructions. Use exactly as directed. Take your medicine at regular intervals. Do not take your medicine more often than directed. It is important that you put your used needles and syringes in a special sharps container. Do not put them in a trash can. If you do not have a sharps container, call your pharmacist or healthcare provider to get one. Talk to your pediatrician regarding the use of this medicine in children. While this drug may be prescribed for  selected conditions, precautions do apply. Overdosage: If you think you have taken too much of this medicine contact a poison control center or emergency room at once. NOTE: This medicine is only for you. Do not share this medicine with others. What if I miss a dose? It is important not to miss your dose. Call your doctor or health care professional if you miss your dose. If you miss a dose due to an On-body Injector failure or leakage, a new dose should be administered as soon as possible using a single prefilled syringe for manual use. What may interact with this medicine? Interactions have not been studied. Give your health care provider a list of all the medicines, herbs, non-prescription drugs, or dietary supplements you use. Also tell them if you smoke, drink alcohol, or use illegal drugs. Some items may interact with your medicine. This list may not describe all possible interactions. Give your health care provider a list of all the medicines, herbs, non-prescription drugs, or dietary supplements you use. Also tell them if you smoke, drink alcohol, or use illegal drugs. Some items may interact with your medicine. What should I watch for while using this medicine? You may need blood work done while you are taking this medicine. If you are going to need a MRI, CT scan, or other procedure, tell your doctor that you are using this medicine (On-Body Injector only). What side effects may I notice from receiving this medicine? Side effects that you should report to your doctor or health care professional as soon as possible: -allergic reactions like skin rash, itching or hives, swelling of the face, lips, or tongue -dizziness -fever -pain, redness, or irritation at site where injected -pinpoint  red spots on the skin -red or dark-brown urine -shortness of breath or breathing problems -stomach or side pain, or pain at the shoulder -swelling -tiredness -trouble passing urine or change in the  amount of urine Side effects that usually do not require medical attention (report to your doctor or health care professional if they continue or are bothersome): -bone pain -muscle pain This list may not describe all possible side effects. Call your doctor for medical advice about side effects. You may report side effects to FDA at 1-800-FDA-1088. Where should I keep my medicine? Keep out of the reach of children. Store pre-filled syringes in a refrigerator between 2 and 8 degrees C (36 and 46 degrees F). Do not freeze. Keep in carton to protect from light. Throw away this medicine if it is left out of the refrigerator for more than 48 hours. Throw away any unused medicine after the expiration date. NOTE: This sheet is a summary. It may not cover all possible information. If you have questions about this medicine, talk to your doctor, pharmacist, or health care provider.    2016, [employer]vier/[employer] Standard. (2014-09-09 14:30:14) Cyclophosphamide injection What is this medicine? CYCLOPHOSPHAMIDE (sye kloe FOSS fa mide) is a chemotherapy drug. It slows the growth of cancer cells. This medicine is used to treat many types of cancer like lymphoma, myeloma, leukemia, breast cancer, and ovarian cancer, to name a few. This medicine may be used for other purposes; ask your health care provider or pharmacist if you have questions. What should I tell my health care provider before I take this medicine? They need to know if you have any of these conditions: -blood disorders -history of other chemotherapy -infection -kidney disease -liver disease -recent or ongoing radiation therapy -tumors in the bone marrow -an unusual or allergic reaction to cyclophosphamide, other chemotherapy, other medicines, foods, dyes, or preservatives -pregnant or trying to get pregnant -breast-feeding How should I use this medicine? This drug is usually given as an injection into a vein or muscle or by infusion into a vein.  It is administered in a hospital or clinic by a specially trained health care professional. Talk to your pediatrician regarding the use of this medicine in children. Special care may be needed. Overdosage: If you think you have taken too much of this medicine contact a poison control center or emergency room at once. NOTE: This medicine is only for you. Do not share this medicine with others. What if I miss a dose? It is important not to miss your dose. Call your doctor or health care professional if you are unable to keep an appointment. What may interact with this medicine? This medicine may interact with the following medications: -amiodarone -amphotericin B -azathioprine -certain antiviral medicines for HIV or AIDS such as protease inhibitors (e.g., indinavir, ritonavir) and zidovudine -certain blood pressure medications such as benazepril, captopril, enalapril, fosinopril, lisinopril, moexipril, monopril, perindopril, quinapril, ramipril, trandolapril -certain cancer medications such as anthracyclines (e.g., daunorubicin, doxorubicin), busulfan, cytarabine, paclitaxel, pentostatin, tamoxifen, trastuzumab -certain diuretics such as chlorothiazide, chlorthalidone, hydrochlorothiazide, indapamide, metolazone -certain medicines that treat or prevent blood clots like warfarin -certain muscle relaxants such as succinylcholine -cyclosporine -etanercept -indomethacin -medicines to increase blood counts like filgrastim, pegfilgrastim, sargramostim -medicines used as general anesthesia -metronidazole -natalizumab This list may not describe all possible interactions. Give your health care provider a list of all the medicines, herbs, non-prescription drugs, or dietary supplements you use. Also tell them if you smoke, drink alcohol, or use illegal drugs. Some items  may interact with your medicine. What should I watch for while using this medicine? Visit your doctor for checks on your progress. This  drug may make you feel generally unwell. This is not uncommon, as chemotherapy can affect healthy cells as well as cancer cells. Report any side effects. Continue your course of treatment even though you feel ill unless your doctor tells you to stop. Drink water or other fluids as directed. Urinate often, even at night. In some cases, you may be given additional medicines to help with side effects. Follow all directions for their use. Call your doctor or health care professional for advice if you get a fever, chills or sore throat, or other symptoms of a cold or flu. Do not treat yourself. This drug decreases your body's ability to fight infections. Try to avoid being around people who are sick. This medicine may increase your risk to bruise or bleed. Call your doctor or health care professional if you notice any unusual bleeding. Be careful brushing and flossing your teeth or using a toothpick because you may get an infection or bleed more easily. If you have any dental work done, tell your dentist you are receiving this medicine. You may get drowsy or dizzy. Do not drive, use machinery, or do anything that needs mental alertness until you know how this medicine affects you. Do not become pregnant while taking this medicine or for 1 year after stopping it. Women should inform their doctor if they wish to become pregnant or think they might be pregnant. Men should not father a child while taking this medicine and for 4 months after stopping it. There is a potential for serious side effects to an unborn child. Talk to your health care professional or pharmacist for more information. Do not breast-feed an infant while taking this medicine. This medicine may interfere with the ability to have a child. This medicine has caused ovarian failure in some women. This medicine has caused reduced sperm counts in some men. You should talk with your doctor or health care professional if you are concerned about your  fertility. If you are going to have surgery, tell your doctor or health care professional that you have taken this medicine. What side effects may I notice from receiving this medicine? Side effects that you should report to your doctor or health care professional as soon as possible: -allergic reactions like skin rash, itching or hives, swelling of the face, lips, or tongue -low blood counts - this medicine may decrease the number of white blood cells, red blood cells and platelets. You may be at increased risk for infections and bleeding. -signs of infection - fever or chills, cough, sore throat, pain or difficulty passing urine -signs of decreased platelets or bleeding - bruising, pinpoint red spots on the skin, black, tarry stools, blood in the urine -signs of decreased red blood cells - unusually weak or tired, fainting spells, lightheadedness -breathing problems -dark urine -dizziness -palpitations -swelling of the ankles, feet, hands -trouble passing urine or change in the amount of urine -weight gain -yellowing of the eyes or skin Side effects that usually do not require medical attention (report to your doctor or health care professional if they continue or are bothersome): -changes in nail or skin color -hair loss -missed menstrual periods -mouth sores -nausea, vomiting This list may not describe all possible side effects. Call your doctor for medical advice about side effects. You may report side effects to FDA at 1-800-FDA-1088. Where should I  keep my medicine? This drug is given in a hospital or clinic and will not be stored at home. NOTE: This sheet is a summary. It may not cover all possible information. If you have questions about this medicine, talk to your doctor, pharmacist, or health care provider.    2016, [employer] Standard. (2012-07-04 16:22:58) Doxorubicin injection What is this medicine? DOXORUBICIN (dox oh ROO bi sin) is a chemotherapy drug. It is used to  treat many kinds of cancer like Hodgkin's disease, leukemia, non-Hodgkin's lymphoma, neuroblastoma, sarcoma, and Wilms' tumor. It is also used to treat bladder cancer, breast cancer, lung cancer, ovarian cancer, stomach cancer, and thyroid cancer. This medicine may be used for other purposes; ask your health care provider or pharmacist if you have questions. What should I tell my health care provider before I take this medicine? They need to know if you have any of these conditions: -blood disorders -heart disease, recent heart attack -infection (especially a virus infection such as chickenpox, cold sores, or herpes) -irregular heartbeat -liver disease -recent or ongoing radiation therapy -an unusual or allergic reaction to doxorubicin, other chemotherapy agents, other medicines, foods, dyes, or preservatives -pregnant or trying to get pregnant -breast-feeding How should I use this medicine? This drug is given as an infusion into a vein. It is administered in a hospital or clinic by a specially trained health care professional. If you have pain, swelling, burning or any unusual feeling around the site of your injection, tell your health care professional right away. Talk to your pediatrician regarding the use of this medicine in children. Special care may be needed. Overdosage: If you think you have taken too much of this medicine contact a poison control center or emergency room at once. NOTE: This medicine is only for you. Do not share this medicine with others. What if I miss a dose? It is important not to miss your dose. Call your doctor or health care professional if you are unable to keep an appointment. What may interact with this medicine? Do not take this medicine with any of the following medications: -cisapride -droperidol -halofantrine -pimozide -zidovudine This medicine may also interact with the following  medications: -chloroquine -chlorpromazine -clarithromycin -cyclophosphamide -cyclosporine -erythromycin -medicines for depression, anxiety, or psychotic disturbances -medicines for irregular heart beat like amiodarone, bepridil, dofetilide, encainide, flecainide, propafenone, quinidine -medicines for seizures like ethotoin, fosphenytoin, phenytoin -medicines for nausea, vomiting like dolasetron, ondansetron, palonosetron -medicines to increase blood counts like filgrastim, pegfilgrastim, sargramostim -methadone -methotrexate -pentamidine -progesterone -vaccines -verapamil Talk to your doctor or health care professional before taking any of these medicines: -acetaminophen -aspirin -ibuprofen -ketoprofen -naproxen This list may not describe all possible interactions. Give your health care provider a list of all the medicines, herbs, non-prescription drugs, or dietary supplements you use. Also tell them if you smoke, drink alcohol, or use illegal drugs. Some items may interact with your medicine. What should I watch for while using this medicine? Your condition will be monitored carefully while you are receiving this medicine. You will need important blood work done while you are taking this medicine. This drug may make you feel generally unwell. This is not uncommon, as chemotherapy can affect healthy cells as well as cancer cells. Report any side effects. Continue your course of treatment even though you feel ill unless your doctor tells you to stop. Your urine may turn red for a few days after your dose. This is not blood. If your urine is dark or brown, call your doctor. In  some cases, you may be given additional medicines to help with side effects. Follow all directions for their use. Call your doctor or health care professional for advice if you get a fever, chills or sore throat, or other symptoms of a cold or flu. Do not treat yourself. This drug decreases your body's ability to  fight infections. Try to avoid being around people who are sick. This medicine may increase your risk to bruise or bleed. Call your doctor or health care professional if you notice any unusual bleeding. Be careful brushing and flossing your teeth or using a toothpick because you may get an infection or bleed more easily. If you have any dental work done, tell your dentist you are receiving this medicine. Avoid taking products that contain aspirin, acetaminophen, ibuprofen, naproxen, or ketoprofen unless instructed by your doctor. These medicines may hide a fever. Men and women of childbearing age should use effective birth control methods while using taking this medicine. Do not become pregnant while taking this medicine. There is a potential for serious side effects to an unborn child. Talk to your health care professional or pharmacist for more information. Do not breast-feed an infant while taking this medicine. Do not let others touch your urine or other body fluids for 5 days after each treatment with this medicine. Caregivers should wear latex gloves to avoid touching body fluids during this time. There is a maximum amount of this medicine you should receive throughout your life. The amount depends on the medical condition being treated and your overall health. Your doctor will watch how much of this medicine you receive in your lifetime. Tell your doctor if you have taken this medicine before. What side effects may I notice from receiving this medicine? Side effects that you should report to your doctor or health care professional as soon as possible: -allergic reactions like skin rash, itching or hives, swelling of the face, lips, or tongue -low blood counts - this medicine may decrease the number of white blood cells, red blood cells and platelets. You may be at increased risk for infections and bleeding. -signs of infection - fever or chills, cough, sore throat, pain or difficulty passing  urine -signs of decreased platelets or bleeding - bruising, pinpoint red spots on the skin, black, tarry stools, blood in the urine -signs of decreased red blood cells - unusually weak or tired, fainting spells, lightheadedness -breathing problems -chest pain -fast, irregular heartbeat -mouth sores -nausea, vomiting -pain, swelling, redness at site where injected -pain, tingling, numbness in the hands or feet -swelling of ankles, feet, or hands -unusual bleeding or bruising Side effects that usually do not require medical attention (report to your doctor or health care professional if they continue or are bothersome): -diarrhea -facial flushing -hair loss -loss of appetite -missed menstrual periods -nail discoloration or damage -red or watery eyes -red colored urine -stomach upset This list may not describe all possible side effects. Call your doctor for medical advice about side effects. You may report side effects to FDA at 1-800-FDA-1088. Where should I keep my medicine? This drug is given in a hospital or clinic and will not be stored at home. NOTE: This sheet is a summary. It may not cover all possible information. If you have questions about this medicine, talk to your doctor, pharmacist, or health care provider.    2016, [employer] Standard. (2012-12-16 09:54:34)   To help prevent nausea and vomiting after your treatment, we encourage you to take your nausea medication  as prescribed by your doctor.  If you develop nausea and vomiting that is not controlled by your nausea medication, call the clinic.   BELOW ARE SYMPTOMS THAT SHOULD BE REPORTED IMMEDIATELY:  *FEVER GREATER THAN 100.5 F  *CHILLS WITH OR WITHOUT FEVER  NAUSEA AND VOMITING THAT IS NOT CONTROLLED WITH YOUR NAUSEA MEDICATION  *UNUSUAL SHORTNESS OF BREATH  *UNUSUAL BRUISING OR BLEEDING  TENDERNESS IN MOUTH AND THROAT WITH OR WITHOUT PRESENCE OF ULCERS  *URINARY PROBLEMS  *BOWEL PROBLEMS  UNUSUAL  RASH Items with * indicate a potential emergency and should be followed up as soon as possible.  Feel free to call the clinic you have any questions or concerns. The clinic phone number is [phone].  Please show the [facility] at check-in to the Emergency Department and triage nurse.

## 2015-09-19 NOTE — Progress Notes (Unsigned)
Error

## 2015-09-19 NOTE — Telephone Encounter (Signed)
Received telephone advice record from [facility], sent to scan. 

## 2015-09-19 NOTE — Telephone Encounter (Signed)
Letter completed.

## 2015-09-19 NOTE — Assessment & Plan Note (Signed)
Left breast biopsy 08/17/2015:1:30 position: Invasive ductal carcinoma, grade 3, ER 0%, PR 0%, HER-2 negative ratio 1.48, Ki-67 90%, 3.1 cm tumor, axilla negative, T2 N0 stage II a clinical stage  Treatment plan: 1. Neoadjuvant chemotherapy with dose dense Adriamycin and Cytoxan 4 followed by Taxol and carboplatin weekly 12 2. Followed by surgery 3. Followed by adjuvant radiation -------------------------------------------------------------------------------------------------------------------------------------------------- Current treatment: Cycle 1 day 1 dose dense Adriamycin and Cytoxan Echocardiogram 09/13/2015: EF 55-60% Labs were reviewed.  Return to clinic in 1 week for toxicity check

## 2015-09-20 ENCOUNTER — Ambulatory Visit (HOSPITAL_BASED_OUTPATIENT_CLINIC_OR_DEPARTMENT_OTHER): Payer: Federal, State, Local not specified - PPO | Admitting: Genetic Counselor

## 2015-09-20 ENCOUNTER — Telehealth: Payer: Self-pay | Admitting: *Deleted

## 2015-09-20 ENCOUNTER — Encounter: Payer: Self-pay | Admitting: Genetic Counselor

## 2015-09-20 ENCOUNTER — Other Ambulatory Visit: Payer: Federal, State, Local not specified - PPO

## 2015-09-20 DIAGNOSIS — Z801 Family history of malignant neoplasm of trachea, bronchus and lung: Secondary | ICD-10-CM

## 2015-09-20 DIAGNOSIS — Z315 Encounter for genetic counseling: Secondary | ICD-10-CM

## 2015-09-20 DIAGNOSIS — Z808 Family history of malignant neoplasm of other organs or systems: Secondary | ICD-10-CM

## 2015-09-20 DIAGNOSIS — C50412 Malignant neoplasm of upper-outer quadrant of left female breast: Secondary | ICD-10-CM | POA: Diagnosis not present

## 2015-09-20 DIAGNOSIS — Z803 Family history of malignant neoplasm of breast: Secondary | ICD-10-CM | POA: Diagnosis not present

## 2015-09-20 DIAGNOSIS — Z809 Family history of malignant neoplasm, unspecified: Secondary | ICD-10-CM

## 2015-09-20 DIAGNOSIS — C50919 Malignant neoplasm of unspecified site of unspecified female breast: Secondary | ICD-10-CM

## 2015-09-20 LAB — MULTIPLE MYELOMA PANEL, SERUM
ALBUMIN SERPL ELPH-MCNC: 3 g/dL (ref 2.9–4.4)
ALPHA 1: 0.3 g/dL (ref 0.0–0.4)
ALPHA2 GLOB SERPL ELPH-MCNC: 0.8 g/dL (ref 0.4–1.0)
Albumin/Glob SerPl: 0.9 (ref 0.7–1.7)
B-Globulin SerPl Elph-Mcnc: 1.3 g/dL (ref 0.7–1.3)
GAMMA GLOB SERPL ELPH-MCNC: 1.3 g/dL (ref 0.4–1.8)
GLOBULIN, TOTAL: 3.7 g/dL (ref 2.2–3.9)
IGA/IMMUNOGLOBULIN A, SERUM: 152 mg/dL (ref 87–352)
IgM, Qn, Serum: 142 mg/dL (ref 26–217)
Total Protein: 6.7 g/dL (ref 6.0–8.5)

## 2015-09-20 LAB — FOLATE: Folate: 16 ng/mL (ref >3.0)

## 2015-09-20 LAB — ERYTHROPOIETIN: ERYTHROPOIETIN: 57.6 m[IU]/mL — AB (ref 2.6–18.5)

## 2015-09-20 LAB — VITAMIN B12: Vitamin B12: 534 pg/mL (ref 211–946)

## 2015-09-20 LAB — HAPTOGLOBIN: HAPTOGLOBIN: 228 mg/dL — AB (ref 34–200)

## 2015-09-20 NOTE — Telephone Encounter (Signed)
Patient returned call. States that she is having some nausea but is able to get some fluids and food down, denies vomiting or diarrhea. Patient advised to sip on fluids and eat small amounts of bland foods, and to take nausea meds. Advised to call clinic if symptoms worsen. She verbalized understanding.

## 2015-09-20 NOTE — Progress Notes (Signed)
REFERRING PROVIDER: Nicholas Lose, MD  PRIMARY PROVIDER:  Shamleffer, Herschell Dimes, MD  PRIMARY REASON FOR VISIT:  1. Breast cancer of upper-outer quadrant of left female breast ([place])   2. Triple negative malignant neoplasm of breast ([place])   3. Family history of breast cancer in female   63. Family history of lung cancer   5. Family history of bone cancer   6. Family history of cancer      HISTORY OF PRESENT ILLNESS:   Cindy Bradshaw, a 45 y.o. female, was seen for a [facility] [place] cancer genetics consultation at the request of Dr. Lindi Adie due to a personal history of triple negative breast cancer at 36 and family history of breast and other cancers.  Cindy Bradshaw presents to clinic today to discuss the possibility of a hereditary predisposition to cancer, genetic testing, and to further clarify her future cancer risks, as well as potential cancer risks for family members.   In December 2016, at the age of 55, Cindy Bradshaw was diagnosed with invasive ductal carcinoma of the left breast.  Hormone receptor status is triple negative. This is being treated with neoadjuvant chemotherapy and her surgery date will be scheduled afterward.  Genetic test results will help inform surgical and further treatment decisions.    CANCER HISTORY:    Breast cancer of upper-outer quadrant of left female breast ([place])   08/17/2015 Initial Diagnosis left breast biopsy 1:30 position: Invasive ductal carcinoma, grade 3, ER 0%, PR 0%, HER-2 negative ratio 1.48, Ki-67 90%, 3.1 cm tumor, axilla negative, T2 N0 stage II a clinical stage   09/19/2015 -  Neo-Adjuvant Chemotherapy Neoadjuvant chemotherapy with dose dense Adriamycin and Cytoxan 4 followed by Taxol and carboplatin weekly 12     HORMONAL RISK FACTORS:  Menarche was at age 35.  First live birth at age 84.  OCP use for approximately 13 years.  Ovaries intact: yes.  Hysterectomy: no.  Menopausal status: premenopausal.  HRT use: 0 years. Colonoscopy: no; not  examined. Mammogram within the last year: was receiving every 2 years previously. Number of breast biopsies: 1. Up to date with pelvic exams:  yes. Any excessive radiation exposure in the past:  no, but does report history of secondhand smoke exposure (parents smoked and her sister previously smoked) and potential asbestos exposure, since a building she lives in was found to have asbestos that has since been partitioned off.  Past Medical History  Diagnosis Date  . Asthma   . Breast cancer ([place])   . Arthritis   . Back disorder     degenerative disk disease  . Pap smear for cervical cancer screening 08/12/15  . Mammogram abnormal 08/24/15    first  . GERD (gastroesophageal reflux disease)     Past Surgical History  Procedure Laterality Date  . Cholecystectomy    . Cesarean section    . Portacath placement [name] 09/12/2015    Procedure: INSERTION PORT-A-CATH;  Surgeon: Excell Seltzer, MD;  Location: [facility];  Service: General;  Laterality: [name];    Social History   Social History  . Marital Status: Married    Spouse Name: Chrissie Noa  . Number of Children: 2  . Years of Education: [name]   Occupational History  . Web designer    Social History Main Topics  . Smoking status: Never Smoker   . Smokeless tobacco: Never Used  . Alcohol Use: 0.0 oz/week    0 Standard drinks or equivalent per week     Comment: occassional glass  of wine  . Drug Use: No  . Sexual Activity: Yes   Other Topics Concern  . None   Social History Narrative   First MP age 82.  LMP 08/26/15.     2 children carried to term.  Age at first live birth 51   H/o oral contrasception use        FAMILY HISTORY:  We obtained a detailed, 4-generation family history.  Significant diagnoses are listed below: Family History  Problem Relation Age of Onset  . Lung cancer Mother 32    2 different types of lung cancer; metastasis to brain; smoker  . Other Mother     hx of hysterectomy for unspecified  reason  . Bone cancer Maternal Uncle     dx. early 47s  . Breast cancer Maternal Grandmother     dx. early 38s, s/p mastectomy  . Cancer Paternal Grandfather     unspecified type of cancer, dx. late 60s  . Congestive Heart Failure Father     smoker  . Stroke Father   . Cirrhosis Maternal Grandfather   . Heart Problems Maternal Grandfather   . Lung cancer Other     (maternal great uncle; MGM's brother); had a coal stove  . Cancer Cousin     unspecified type; d. early age (paternal first cousin once-removed)  . Cancer Cousin     dx. as a kid; in remission today; (paternal 2nd cousin)  . Cancer Cousin     unspecified type; d. early 30s; (maternal 1st cousin)    Cindy Bradshaw has one daughter who is 52 and one son who is 18.  She currently has four grandchildren.  She has one full sister who is 74 and one brother who is 27.  Neither of her sibling have ever been diagnosed with cancer.  Cindy Bradshaw mother was diagnosed with and passed away from lung cancer at age 36.  She was a smoker and had two different types of lung cancer, one of which quickly metastasized to her brain.  She also had a history of a hysterectomy for an unspecified reason.  Cindy Bradshaw father is currently 32 and has a history of a stroke and congestive heart failure, but he has never been diagnosed with cancer.  Cindy Bradshaw mother had one full brother and one maternal half-brother.  Her full brother died of a "bone cancer" in his early 70s.  He had two sons and one daughter.  One son died of an unspecified type of cancer in his early 22s.  Cindy Bradshaw mother's half-brother is currently in his late 53s and has not been diagnosed with cancer.  Cindy Bradshaw maternal grandmother was diagnosed with breast cancer in her early 51s and had a mastectomy at the time.  She died at the age of 15.  One maternal great uncle died of lung cancer and there is limited additional information for additional maternal great aunts/uncles.  Cindy Bradshaw maternal  grandfather died in his mid-4s.  He had heart problems and liver cirrhosis.  Cindy Bradshaw is unaware of any additional cancers on the maternal side of the family.  Cindy Bradshaw father had one full sister and one maternal half-brother.  His sister is currently living and has never had cancer.  His half-brother died as a kid when he fell off of a swing set.  Cindy Bradshaw has one paternal first cousin, once-removed who died of an unspecified cancer at an early age.  Another cousin--a paternal second cousin (not  a child of the previous cousin) had cancer as a child and is currently in remission.  Cindy Bradshaw' paternal grandmother died of "old age" and her grandfather died of an unspecified type of cancer in his late 53s.  She has no further information for any maternal great aunts/uncles or great grandparents.    Patient's maternal ancestors are of Native [place] - [place] and [place] American descent, and paternal ancestors are of [place] American and Caucasian descent. There is no reported Ashkenazi Jewish ancestry. There is no known consanguinity.  GENETIC COUNSELING ASSESSMENT: Cindy Bradshaw is a 45 y.o. female with a personal and family history of breast cancer which is somewhat suggestive of a hereditary breast cancer syndrome and predisposition to cancer. We, therefore, discussed and recommended the following at today's visit.   DISCUSSION: We reviewed the characteristics, features and inheritance patterns of hereditary cancer syndromes, particularly those caused by mutations within the BRCA1/2 genes. We also discussed genetic testing, including the appropriate family members to test, the process of testing, insurance coverage and turn-around-time for results. We discussed the implications of a negative, positive and/or variant of uncertain significant result. We recommended Cindy Bradshaw pursue genetic testing for the 20-gene Breast/Ovarian Cancer Panel through [facility] Hope Pigeon, MD).  The  Breast/Ovarian Cancer Panel offered by [facility] Hope Pigeon, MD) includes sequencing and deletion/duplication analysis for the following 19 genes:  ATM, BARD1, BRCA1, BRCA2, BRIP1, CDH1, CHEK2, FANCC, MLH1, MSH2, MSH6, NBN, PALB2, PMS2, PTEN, RAD51C, RAD51D, TP53, and XRCC2.  This panel also includes deletion/duplication analysis (without sequencing) for one gene, EPCAM.  Based on Ms. Stotler's personal and family history of cancer, she meets medical criteria for genetic testing. Despite that she meets criteria, she may still have an out of pocket cost. We discussed that if her out of pocket cost for testing is over $100, the laboratory will call and confirm whether she wants to proceed with testing.  If the out of pocket cost of testing is less than $100 she will be billed by the genetic testing laboratory.   PLAN: After considering the risks, benefits, and limitations, Cindy Bradshaw  provided informed consent to pursue genetic testing and the blood sample was sent to [facility] for analysis of the 20-gene Breast/Ovarian Cancer Panel test. Results should be available within approximately 2-3 weeks' time, at which point they will be disclosed by telephone to Ms. Rackham, as will any additional recommendations warranted by these results. Ms. Bozarth will receive a summary of her genetic counseling visit and a copy of her results once available. This information will also be available in Epic. We encouraged Ms. Cueva to remain in contact with cancer genetics annually so that we can continuously update the family history and inform her of any changes in cancer genetics and testing that may be of benefit for her family. Ms. Arkin questions were answered to her satisfaction today. Our contact information was provided should additional questions or concerns arise.  Thank you for the referral and allowing [place] to share in the care of your patient.   Jeanine Luz, MS Genetic  Counselor [email or web address] Phone: [phone]  The patient was seen for a total of 65 minutes in face-to-face genetic counseling.  This patient was discussed with Drs. Magrinat, Lindi Adie and/or Burr Medico who agrees with the above.    _______________________________________________________________________ For Office Staff:  Number of people involved in session: 1 Was an Intern/ student involved with case: no

## 2015-09-20 NOTE — Telephone Encounter (Signed)
1st AC yesterday. Called and went straight to VM. Left message for patient to return call to clinic and speak with any nurse.

## 2015-09-21 ENCOUNTER — Other Ambulatory Visit: Payer: Self-pay | Admitting: General Surgery

## 2015-09-21 DIAGNOSIS — N6320 Unspecified lump in the left breast, unspecified quadrant: Secondary | ICD-10-CM

## 2015-09-22 ENCOUNTER — Telehealth: Payer: Self-pay | Admitting: Hematology and Oncology

## 2015-09-22 NOTE — Telephone Encounter (Signed)
Called patient with new appt time per dr Lindi Adie and she also request that i take out her home number as it will be cut off soon and keep only the work number listed   anne

## 2015-09-23 ENCOUNTER — Ambulatory Visit (HOSPITAL_BASED_OUTPATIENT_CLINIC_OR_DEPARTMENT_OTHER): Payer: Federal, State, Local not specified - PPO | Admitting: Nurse Practitioner

## 2015-09-23 ENCOUNTER — Other Ambulatory Visit: Payer: Self-pay | Admitting: *Deleted

## 2015-09-23 ENCOUNTER — Telehealth: Payer: Self-pay | Admitting: *Deleted

## 2015-09-23 ENCOUNTER — Ambulatory Visit (HOSPITAL_BASED_OUTPATIENT_CLINIC_OR_DEPARTMENT_OTHER): Payer: Federal, State, Local not specified - PPO

## 2015-09-23 ENCOUNTER — Other Ambulatory Visit: Payer: Self-pay | Admitting: Nurse Practitioner

## 2015-09-23 ENCOUNTER — Encounter: Payer: Self-pay | Admitting: *Deleted

## 2015-09-23 VITALS — BP 115/56 | HR 86 | Temp 99.2°F | Resp 18 | Ht 65.0 in | Wt 229.5 lb

## 2015-09-23 DIAGNOSIS — Z95828 Presence of other vascular implants and grafts: Secondary | ICD-10-CM

## 2015-09-23 DIAGNOSIS — C50412 Malignant neoplasm of upper-outer quadrant of left female breast: Secondary | ICD-10-CM

## 2015-09-23 DIAGNOSIS — R112 Nausea with vomiting, unspecified: Secondary | ICD-10-CM | POA: Diagnosis not present

## 2015-09-23 DIAGNOSIS — E860 Dehydration: Secondary | ICD-10-CM

## 2015-09-23 LAB — COMPREHENSIVE METABOLIC PANEL
ALBUMIN: 3.2 g/dL — AB (ref 3.5–5.0)
ALK PHOS: 98 U/L (ref 40–150)
ALT: 37 U/L (ref 0–55)
ANION GAP: 7 meq/L (ref 3–11)
AST: 17 U/L (ref 5–34)
BUN: 20.7 mg/dL (ref 7.0–26.0)
CALCIUM: 10 mg/dL (ref 8.4–10.4)
CHLORIDE: 106 meq/L (ref 98–109)
CO2: 22 mEq/L (ref 22–29)
Creatinine: 0.8 mg/dL (ref 0.6–1.1)
Glucose: 117 mg/dl (ref 70–140)
POTASSIUM: 4 meq/L (ref 3.5–5.1)
Sodium: 135 mEq/L — ABNORMAL LOW (ref 136–145)
Total Bilirubin: 0.49 mg/dL (ref 0.20–1.20)
Total Protein: 7.5 g/dL (ref 6.4–8.3)

## 2015-09-23 LAB — CBC WITH DIFFERENTIAL/PLATELET
BASO%: 0.4 % (ref 0.0–2.0)
BASOS ABS: 0.1 10*3/uL (ref 0.0–0.1)
EOS ABS: 0 10*3/uL (ref 0.0–0.5)
EOS%: 0 % (ref 0.0–7.0)
HEMATOCRIT: 32.2 % — AB (ref 34.8–46.6)
HEMOGLOBIN: 9.9 g/dL — AB (ref 11.6–15.9)
LYMPH#: 1 10*3/uL (ref 0.9–3.3)
LYMPH%: 2.6 % — ABNORMAL LOW (ref 14.0–49.7)
MCH: 24.9 pg — AB (ref 25.1–34.0)
MCHC: 30.8 g/dL — ABNORMAL LOW (ref 31.5–36.0)
MCV: 80.8 fL (ref 79.5–101.0)
MONO#: 0.2 10*3/uL (ref 0.1–0.9)
MONO%: 0.5 % (ref 0.0–14.0)
NEUT#: 35.5 10*3/uL — ABNORMAL HIGH (ref 1.5–6.5)
NEUT%: 96.5 % — ABNORMAL HIGH (ref 38.4–76.8)
Platelets: 454 10*3/uL — ABNORMAL HIGH (ref 145–400)
RBC: 3.99 10*6/uL (ref 3.70–5.45)
RDW: 15.5 % — AB (ref 11.2–14.5)
WBC: 36.7 10*3/uL — ABNORMAL HIGH (ref 3.9–10.3)

## 2015-09-23 MED ORDER — LORAZEPAM 2 MG/ML IJ SOLN
INTRAMUSCULAR | Status: AC
  Filled 2015-09-23: qty 1

## 2015-09-23 MED ORDER — LORAZEPAM 2 MG/ML IJ SOLN
0.5000 mg | Freq: Once | INTRAMUSCULAR | Status: AC
  Administered 2015-09-23: 0.5 mg via INTRAVENOUS

## 2015-09-23 MED ORDER — SODIUM CHLORIDE 0.9 % IV SOLN
Freq: Once | INTRAVENOUS | Status: AC
  Administered 2015-09-23: 15:00:00 via INTRAVENOUS
  Filled 2015-09-23: qty 4

## 2015-09-23 MED ORDER — SODIUM CHLORIDE 0.9 % IJ SOLN
10.0000 mL | INTRAMUSCULAR | Status: DC | PRN
  Administered 2015-09-23: 10 mL via INTRAVENOUS
  Filled 2015-09-23: qty 10

## 2015-09-23 MED ORDER — SODIUM CHLORIDE 0.9 % IV SOLN
INTRAVENOUS | Status: AC
  Administered 2015-09-23: 15:00:00 via INTRAVENOUS

## 2015-09-23 MED ORDER — SODIUM CHLORIDE 0.9 % IJ SOLN
10.0000 mL | Freq: Once | INTRAMUSCULAR | Status: AC
  Administered 2015-09-23: 10 mL via INTRAVENOUS
  Filled 2015-09-23: qty 10

## 2015-09-23 MED ORDER — HEPARIN SOD (PORK) LOCK FLUSH 100 UNIT/ML IV SOLN
500.0000 [IU] | Freq: Once | INTRAVENOUS | Status: AC
  Administered 2015-09-23: 500 [IU] via INTRAVENOUS
  Filled 2015-09-23: qty 5

## 2015-09-23 NOTE — Telephone Encounter (Signed)
Received call from patient stating she has been vomiting since yesterday and not able to keep anything down.  She states she is taking her nausea medication.  She has been having some soft stools as well.  Appt. Made to see Selena Lesser, labs, and fluids.

## 2015-09-23 NOTE — Patient Instructions (Signed)

## 2015-09-23 NOTE — Progress Notes (Signed)
1700  Pt taking sips of gingerale -no vomiting, nausea easing up after zofran and ativan IV.  Pt to return tomorrow for additional IV fluids @ 9am. Selena Lesser, NP to put orders in for another 1.5 liters of NS over 2 hours.  Pt and husband voice understanding  And also know to call if nausea uncontrolled vomiting reoccurs. Educated on drinking clear fluids only with some added sugar. No fruit, vegetables, milk products or other solid foods at this time other than saltine crackers.  Again pt voiced understanding.

## 2015-09-23 NOTE — Progress Notes (Signed)
[place] Work  Clinical Social Work was referred by patient navigator to assist with transportation to [facility].  Clinical Social Worker referred pt to [employer] taxi due to medical need to be seen and no transportation available, pt ill, cannot ride bus. Taxi voucher faxed to [facility], cab in route.    Loren Racer, [place] Worker [place]  [place] Phone: [phone] Fax: [phone]

## 2015-09-24 ENCOUNTER — Ambulatory Visit (HOSPITAL_BASED_OUTPATIENT_CLINIC_OR_DEPARTMENT_OTHER): Payer: Federal, State, Local not specified - PPO

## 2015-09-24 DIAGNOSIS — E860 Dehydration: Secondary | ICD-10-CM | POA: Diagnosis not present

## 2015-09-24 MED ORDER — SODIUM CHLORIDE 0.9 % IV SOLN
Freq: Once | INTRAVENOUS | Status: AC | PRN
  Administered 2015-09-24: 09:00:00 via INTRAVENOUS

## 2015-09-24 MED ORDER — LORAZEPAM 2 MG/ML IJ SOLN
0.5000 mg | Freq: Once | INTRAMUSCULAR | Status: AC | PRN
  Administered 2015-09-24: 0.5 mg via INTRAVENOUS

## 2015-09-24 MED ORDER — LORAZEPAM 2 MG/ML IJ SOLN
INTRAMUSCULAR | Status: AC
  Filled 2015-09-24: qty 1

## 2015-09-24 MED ORDER — SODIUM CHLORIDE 0.9 % IV SOLN
INTRAVENOUS | Status: AC
  Administered 2015-09-24: 09:00:00 via INTRAVENOUS

## 2015-09-24 MED ORDER — SODIUM CHLORIDE 0.9 % IJ SOLN
10.0000 mL | INTRAMUSCULAR | Status: DC | PRN
  Administered 2015-09-24: 10 mL via INTRAVENOUS
  Filled 2015-09-24: qty 10

## 2015-09-24 MED ORDER — HEPARIN SOD (PORK) LOCK FLUSH 100 UNIT/ML IV SOLN
500.0000 [IU] | Freq: Once | INTRAVENOUS | Status: AC
  Administered 2015-09-24: 500 [IU] via INTRAVENOUS
  Filled 2015-09-24: qty 5

## 2015-09-24 NOTE — Patient Instructions (Signed)

## 2015-09-25 ENCOUNTER — Encounter: Payer: Self-pay | Admitting: Nurse Practitioner

## 2015-09-25 DIAGNOSIS — R112 Nausea with vomiting, unspecified: Secondary | ICD-10-CM | POA: Insufficient documentation

## 2015-09-25 DIAGNOSIS — T451X5A Adverse effect of antineoplastic and immunosuppressive drugs, initial encounter: Secondary | ICD-10-CM | POA: Insufficient documentation

## 2015-09-25 NOTE — Assessment & Plan Note (Signed)
Patient received cycle one of her dose dense AC chemotherapy on 09/19/2015.  She reports to the [facility] today with complaint of severe nausea, vomiting, and subsequent dehydration.  She has tried Zofran, Compazine, and Ativan at home with only minimal effectiveness.    Labs obtained today were essentially normal; with the exception of a sodium level of 135.  Vitals were stable with patient afebrile.  Patient received approximately 1500 ML's normal saline IV fluid rehydration while at the cancer Center today.  Despite receiving hydration; patient continued to feel nauseous.  Discussion with both patient and her husband regarding the possibility of either transporting to the emergency department or direct admitting the patient for further evaluation and continued rehydration.  Patient stated that she would prefer to go home this evening; and return to the cancer Center tomorrow to receive additional IV fluid rehydration.  Patient has plans to return to the [facility] tomorrow to receive additional IV fluid rehydration.  IV fluid orders are already placed.  Patient was also advised to go directly to the emergency department for any worsening symptoms over the weekend whatsoever.

## 2015-09-25 NOTE — Assessment & Plan Note (Signed)
Patient received cycle one of her dose dense AC chemotherapy on 09/19/2015.  She reports to the [facility] today with complaint of severe nausea, vomiting, and subsequent dehydration.  She has tried Zofran, Compazine, and Ativan at home with only minimal effectiveness.  See further notes for details.  Patient is scheduled to return for IV fluid rehydration only on Saturday, 09/24/2015.  She is scheduled to return for labs and a follow-up visit on 09/26/2015.

## 2015-09-25 NOTE — Progress Notes (Signed)
SYMPTOM MANAGEMENT CLINIC   HPI: Cindy Bradshaw 45 y.o. female diagnosed with breast cancer.  Currently undergoing dose dense AC chemotherapy regimen.  Patient received cycle one of her dose dense AC chemotherapy on 09/19/2015.  She reports to the [facility] today with complaint of severe nausea, vomiting, and subsequent dehydration.  She has tried Zofran, Compazine, and Ativan at home with only minimal effectiveness.    Labs obtained today were essentially normal; with the exception of a sodium level of 135.  Vitals were stable with patient afebrile.  Patient received approximately 1500 ML's normal saline IV fluid rehydration while at the cancer Center today.  Despite receiving hydration; patient continued to feel nauseous.  Discussion with both patient and her husband regarding the possibility of either transporting to the emergency department or direct admitting the patient for further evaluation and continued rehydration.  Patient stated that she would prefer to go home this evening; and return to the cancer Center tomorrow to receive additional IV fluid rehydration.  Patient has plans to return to the [facility] tomorrow to receive additional IV fluid rehydration.  IV fluid orders are already placed.  Patient was also advised to go directly to the emergency department for any worsening symptoms over the weekend whatsoever.   HPI  Review of Systems  Constitutional: Positive for malaise/fatigue. Negative for fever and chills.  Gastrointestinal: Positive for nausea and vomiting. Negative for abdominal pain and diarrhea.  All other systems reviewed and are negative.   Past Medical History  Diagnosis Date  . Asthma   . Breast cancer ([place])   . Arthritis   . Back disorder     degenerative disk disease  . Pap smear for cervical cancer screening 08/12/15  . Mammogram abnormal 08/24/15    first  . GERD (gastroesophageal reflux disease)     Past Surgical History    Procedure Laterality Date  . Cholecystectomy    . Cesarean section    . Portacath placement [name] 09/12/2015    Procedure: INSERTION PORT-A-CATH;  Surgeon: Excell Seltzer, MD;  Location: [facility];  Service: General;  Laterality: [name];    has Breast cancer of upper-outer quadrant of left female breast ([place]); Normocytic normochromic anemia; Family history of breast cancer in female; Dehydration; and Nausea with vomiting on her problem list.    is allergic to dilaudid; ivp dye; percocet; and toradol.    Medication List       This list is accurate as of: 09/23/15 11:59 PM.  Always use your most recent med list.               dexamethasone 4 MG tablet  Commonly known as:  DECADRON  Take 1 tablet (4 mg total) by mouth 2 (two) times daily. Take 1 tablets by mouth once a day on the day after chemotherapy and then take 1 tablets two times a day for 2 days. Take with food.     HYDROcodone-acetaminophen 10-325 MG tablet  Commonly known as:  NORCO  Take 1-2 tablets by mouth every 4 (four) hours as needed (pain).     lidocaine-prilocaine cream  Commonly known as:  EMLA  Apply to affected area once     LORazepam 0.5 MG tablet  Commonly known as:  ATIVAN  Take 1 tablet (0.5 mg total) by mouth at bedtime.     ondansetron 4 MG disintegrating tablet  Commonly known as:  ZOFRAN ODT  Take 1 tablet (4 mg total) by mouth every 8 (eight)  hours as needed for nausea or vomiting.     ondansetron 8 MG tablet  Commonly known as:  ZOFRAN  Take 1 tablet (8 mg total) by mouth 2 (two) times daily as needed. Start on the third day after chemotherapy.     PROAIR HFA 108 (90 Base) MCG/ACT inhaler  Generic drug:  albuterol  Inhale 2 puffs into the lungs every 4 (four) hours as needed. For shortness of breath     prochlorperazine 10 MG tablet  Commonly known as:  COMPAZINE  Take 1 tablet (10 mg total) by mouth every 6 (six) hours as needed (Nausea or vomiting).         PHYSICAL  EXAMINATION  Oncology Vitals 09/23/2015 09/19/2015  Height 165 cm -  Weight 104.101 kg 107.593 kg  Weight (lbs) 229 lbs 8 oz 237 lbs 3 oz  BMI (kg/m2) 38.19 kg/m2 -  Temp 99.2 98.6  Pulse 86 90  Resp 18 20  SpO2 98 99  BSA (m2) 2.18 m2 -   BP Readings from Last 2 Encounters:  09/23/15 115/56  09/19/15 137/86    Physical Exam  Constitutional: She is oriented to person, place, and time. Vital signs are normal. She appears dehydrated. She appears unhealthy.  HENT:  Head: Normocephalic and atraumatic.  Mouth/Throat: Oropharynx is clear and moist.  Eyes: Conjunctivae and EOM are normal. Pupils are equal, round, and reactive to light. Right eye exhibits no discharge. Left eye exhibits no discharge. No scleral icterus.  Neck: Normal range of motion. Neck supple. No JVD present. No tracheal deviation present. No thyromegaly present.  Cardiovascular: Normal rate, regular rhythm, normal heart sounds and intact distal pulses.   Pulmonary/Chest: Effort normal and breath sounds normal. No respiratory distress. She has no wheezes. She has no rales. She exhibits no tenderness.  Abdominal: Soft. Bowel sounds are normal. She exhibits no distension and no mass. There is no tenderness. There is no rebound and no guarding.  Musculoskeletal: Normal range of motion. She exhibits no edema or tenderness.  Lymphadenopathy:    She has no cervical adenopathy.  Neurological: She is alert and oriented to person, place, and time. Gait normal.  Skin: Skin is warm and dry. No rash noted. No erythema. There is pallor.  Psychiatric: Affect normal.  Nursing note and vitals reviewed.   LABORATORY DATA:. Appointment on 09/23/2015  Component Date Value Ref Range Status  . WBC 09/23/2015 36.7* 3.9 - 10.3 10e3/uL Final  . NEUT# 09/23/2015 35.5* 1.5 - 6.5 10e3/uL Final  . HGB 09/23/2015 9.9* 11.6 - 15.9 g/dL Final  . HCT 09/23/2015 32.2* 34.8 - 46.6 % Final  . Platelets 09/23/2015 454* 145 - 400 10e3/uL Final  .  MCV 09/23/2015 80.8  79.5 - 101.0 fL Final  . MCH 09/23/2015 24.9* 25.1 - 34.0 pg Final  . MCHC 09/23/2015 30.8* 31.5 - 36.0 g/dL Final  . RBC 09/23/2015 3.99  3.70 - 5.45 10e6/uL Final  . RDW 09/23/2015 15.5* 11.2 - 14.5 % Final  . lymph# 09/23/2015 1.0  0.9 - 3.3 10e3/uL Final  . MONO# 09/23/2015 0.2  0.1 - 0.9 10e3/uL Final  . Eosinophils Absolute 09/23/2015 0.0  0.0 - 0.5 10e3/uL Final  . Basophils Absolute 09/23/2015 0.1  0.0 - 0.1 10e3/uL Final  . NEUT% 09/23/2015 96.5* 38.4 - 76.8 % Final  . LYMPH% 09/23/2015 2.6* 14.0 - 49.7 % Final  . MONO% 09/23/2015 0.5  0.0 - 14.0 % Final  . EOS% 09/23/2015 0.0  0.0 - 7.0 %  Final  . BASO% 09/23/2015 0.4  0.0 - 2.0 % Final  . Sodium 09/23/2015 135* 136 - 145 mEq/L Final  . Potassium 09/23/2015 4.0  3.5 - 5.1 mEq/L Final  . Chloride 09/23/2015 106  98 - 109 mEq/L Final  . CO2 09/23/2015 22  22 - 29 mEq/L Final  . Glucose 09/23/2015 117  70 - 140 mg/dl Final   Glucose reference range is for nonfasting patients. Fasting glucose reference range is 70- 100.  Marland Kitchen BUN 09/23/2015 20.7  7.0 - 26.0 mg/dL Final  . Creatinine 09/23/2015 0.8  0.6 - 1.1 mg/dL Final  . Total Bilirubin 09/23/2015 0.49  0.20 - 1.20 mg/dL Final  . Alkaline Phosphatase 09/23/2015 98  40 - 150 U/L Final  . AST 09/23/2015 17  5 - 34 U/L Final  . ALT 09/23/2015 37  0 - 55 U/L Final  . Total Protein 09/23/2015 7.5  6.4 - 8.3 g/dL Final  . Albumin 09/23/2015 3.2* 3.5 - 5.0 g/dL Final  . Calcium 09/23/2015 10.0  8.4 - 10.4 mg/dL Final  . Anion Gap 09/23/2015 7  3 - 11 mEq/L Final  . EGFR 09/23/2015 >90  >90 ml/min/1.73 m2 Final   eGFR is calculated using the CKD-EPI Creatinine Equation (2009)     RADIOGRAPHIC STUDIES: No results found.  ASSESSMENT/PLAN:    Breast cancer of upper-outer quadrant of left female breast [facility]) Patient received cycle one of her dose dense AC chemotherapy on 09/19/2015.  She reports to the [facility] today with complaint of severe nausea,  vomiting, and subsequent dehydration.  She has tried Zofran, Compazine, and Ativan at home with only minimal effectiveness.  See further notes for details.  Patient is scheduled to return for IV fluid rehydration only on Saturday, 09/24/2015.  She is scheduled to return for labs and a follow-up visit on 09/26/2015.  Dehydration Patient received cycle one of her dose dense AC chemotherapy on 09/19/2015.  She reports to the [facility] today with complaint of severe nausea, vomiting, and subsequent dehydration.  She has tried Zofran, Compazine, and Ativan at home with only minimal effectiveness.    Labs obtained today were essentially normal; with the exception of a sodium level of 135.  Vitals were stable with patient afebrile.  Patient received approximately 1500 ML's normal saline IV fluid rehydration while at the cancer Center today.  Despite receiving hydration; patient continued to feel nauseous.  Discussion with both patient and her husband regarding the possibility of either transporting to the emergency department or direct admitting the patient for further evaluation and continued rehydration.  Patient stated that she would prefer to go home this evening; and return to the cancer Center tomorrow to receive additional IV fluid rehydration.  Patient has plans to return to the [facility] tomorrow to receive additional IV fluid rehydration.  IV fluid orders are already placed.  Patient was also advised to go directly to the emergency department for any worsening symptoms over the weekend whatsoever.  Nausea with vomiting Patient received cycle one of her dose dense AC chemotherapy on 09/19/2015.  She reports to the [employer] today with complaint of severe nausea, vomiting, and subsequent dehydration.  She has tried Zofran, Compazine, and Ativan at home with only minimal effectiveness.    Labs obtained today were essentially normal; with the exception of a sodium level of  135.  Vitals were stable with patient afebrile.  Patient received approximately 1500 ML's normal saline IV fluid rehydration while at the cancer  Center today.  Despite receiving hydration; patient continued to feel nauseous.  Discussion with both patient and her husband regarding the possibility of either transporting to the emergency department or direct admitting the patient for further evaluation and continued rehydration.  Patient stated that she would prefer to go home this evening; and return to the cancer Center tomorrow to receive additional IV fluid rehydration.  Patient has plans to return to the [facility] tomorrow to receive additional IV fluid rehydration.  IV fluid orders are already placed.  Patient was also advised to go directly to the emergency department for any worsening symptoms over the weekend whatsoever.  Patient stated understanding of all instructions; and was in agreement with this plan of care. The patient knows to call the clinic with any problems, questions or concerns.   Review/collaboration with Dr. Lindi Adie regarding all aspects of patient's visit today.   Total time spent with patient was 25 minutes;  with greater than 75 percent of that time spent in face to face counseling regarding patient's symptoms,  and coordination of care and follow up.  Disclaimer:This dictation was prepared with Dragon/digital dictation along with [employer]. Any transcriptional errors that result from this process are unintentional.  Drue Second, NP 09/25/2015

## 2015-09-25 NOTE — Assessment & Plan Note (Signed)
Patient received cycle one of her dose dense AC chemotherapy on 09/19/2015.  She reports to the [facility] today with complaint of severe nausea, vomiting, and subsequent dehydration.  She has tried Zofran, Compazine, and Ativan at home with only minimal effectiveness.    Labs obtained today were essentially normal; with the exception of a sodium level of 135.  Vitals were stable with patient afebrile.  Patient received approximately 1500 ML's normal saline IV fluid rehydration while at the cancer Center today.  Despite receiving hydration; patient continued to feel nauseous.  Discussion with both patient and her husband regarding the possibility of either transporting to the emergency department or direct admitting the patient for further evaluation and continued rehydration.  Patient stated that she would prefer to go home this evening; and return to the cancer Center tomorrow to receive additional IV fluid rehydration.  Patient has plans to return to the [facility] tomorrow to receive additional IV fluid rehydration.  IV fluid orders are already placed.  Patient was also advised to go directly to the emergency department for any worsening symptoms over the weekend whatsoever.

## 2015-09-25 NOTE — Assessment & Plan Note (Signed)
Left breast biopsy 08/17/2015:1:30 position: Invasive ductal carcinoma, grade 3, ER 0%, PR 0%, HER-2 negative ratio 1.48, Ki-67 90%, 3.1 cm tumor, axilla negative, T2 N0 stage II a clinical stage  Treatment plan: 1. Neoadjuvant chemotherapy with dose dense Adriamycin and Cytoxan 4 followed by Taxol and carboplatin weekly 12 2. Followed by surgery 3. Followed by adjuvant radiation -------------------------------------------------------------------------------------------------------------------------------------------------- Current treatment: Cycle 1 day 8 dose dense Adriamycin and Cytoxan Echocardiogram 09/13/2015: EF 55-60% Labs were reviewed. Normocytic anemia: Unclear etiology, I will obtain additional lab work including TSH, serum protein pheresis, haptoglobin LDH and reticulocyte count for further evaluation. It appears that the patient has been chronically anemic.  Monitoring very closely for chemotherapy toxicities. Return to clinic in 1 week for cycle 2

## 2015-09-26 ENCOUNTER — Other Ambulatory Visit: Payer: Federal, State, Local not specified - PPO

## 2015-09-26 ENCOUNTER — Telehealth: Payer: Self-pay | Admitting: Hematology and Oncology

## 2015-09-26 ENCOUNTER — Other Ambulatory Visit (HOSPITAL_BASED_OUTPATIENT_CLINIC_OR_DEPARTMENT_OTHER): Payer: Federal, State, Local not specified - PPO | Admitting: *Deleted

## 2015-09-26 ENCOUNTER — Other Ambulatory Visit: Payer: Self-pay | Admitting: *Deleted

## 2015-09-26 ENCOUNTER — Ambulatory Visit: Payer: Federal, State, Local not specified - PPO | Admitting: Hematology and Oncology

## 2015-09-26 ENCOUNTER — Ambulatory Visit (HOSPITAL_BASED_OUTPATIENT_CLINIC_OR_DEPARTMENT_OTHER): Payer: Federal, State, Local not specified - PPO | Admitting: Hematology and Oncology

## 2015-09-26 ENCOUNTER — Encounter: Payer: Self-pay | Admitting: *Deleted

## 2015-09-26 ENCOUNTER — Encounter: Payer: Self-pay | Admitting: Hematology and Oncology

## 2015-09-26 VITALS — BP 147/66 | HR 70 | Temp 98.7°F | Resp 18 | Ht 65.0 in | Wt 230.6 lb

## 2015-09-26 DIAGNOSIS — C50412 Malignant neoplasm of upper-outer quadrant of left female breast: Secondary | ICD-10-CM

## 2015-09-26 DIAGNOSIS — N63 Unspecified lump in breast: Secondary | ICD-10-CM

## 2015-09-26 DIAGNOSIS — R112 Nausea with vomiting, unspecified: Secondary | ICD-10-CM | POA: Diagnosis not present

## 2015-09-26 DIAGNOSIS — D649 Anemia, unspecified: Secondary | ICD-10-CM

## 2015-09-26 LAB — CBC WITH DIFFERENTIAL/PLATELET
BASO%: 0 % (ref 0.0–2.0)
BASOS ABS: 0 10*3/uL (ref 0.0–0.1)
EOS%: 0.5 % (ref 0.0–7.0)
Eosinophils Absolute: 0 10*3/uL (ref 0.0–0.5)
HEMATOCRIT: 29.9 % — AB (ref 34.8–46.6)
HGB: 9.5 g/dL — ABNORMAL LOW (ref 11.6–15.9)
LYMPH%: 15.8 % (ref 14.0–49.7)
MCH: 26 pg (ref 25.1–34.0)
MCHC: 31.8 g/dL (ref 31.5–36.0)
MCV: 81.9 fL (ref 79.5–101.0)
MONO#: 0.1 10*3/uL (ref 0.1–0.9)
MONO%: 1.1 % (ref 0.0–14.0)
NEUT#: 3.6 10*3/uL (ref 1.5–6.5)
NEUT%: 82.6 % — ABNORMAL HIGH (ref 38.4–76.8)
PLATELETS: 398 10*3/uL (ref 145–400)
RBC: 3.65 10*6/uL — ABNORMAL LOW (ref 3.70–5.45)
RDW: 14.8 % — AB (ref 11.2–14.5)
WBC: 4.4 10*3/uL (ref 3.9–10.3)
lymph#: 0.7 10*3/uL — ABNORMAL LOW (ref 0.9–3.3)

## 2015-09-26 LAB — COMPREHENSIVE METABOLIC PANEL
ALK PHOS: 88 U/L (ref 40–150)
ALT: 38 U/L (ref 0–55)
ANION GAP: 5 meq/L (ref 3–11)
AST: 15 U/L (ref 5–34)
Albumin: 3.5 g/dL (ref 3.5–5.0)
BUN: 20.9 mg/dL (ref 7.0–26.0)
CALCIUM: 9.7 mg/dL (ref 8.4–10.4)
CHLORIDE: 107 meq/L (ref 98–109)
CO2: 23 mEq/L (ref 22–29)
Creatinine: 0.8 mg/dL (ref 0.6–1.1)
EGFR: >90 mL/min/{1.73_m2} (ref >90)
Glucose: 103 mg/dl (ref 70–140)
POTASSIUM: 3.9 meq/L (ref 3.5–5.1)
Sodium: 136 mEq/L (ref 136–145)
Total Bilirubin: 0.43 mg/dL (ref 0.20–1.20)
Total Protein: 7.3 g/dL (ref 6.4–8.3)

## 2015-09-26 NOTE — Telephone Encounter (Signed)
Appointments made and avs printed °

## 2015-09-26 NOTE — Progress Notes (Signed)
Patient Care Team: [name], MD as PCP - General (Internal Medicine)  DIAGNOSIS: No matching staging information was found for the patient.  SUMMARY OF ONCOLOGIC HISTORY:   Breast cancer of upper-outer quadrant of left female breast ([place])   08/17/2015 Initial Diagnosis left breast biopsy 1:30 position: Invasive ductal carcinoma, grade 3, ER 0%, PR 0%, HER-2 negative ratio 1.48, Ki-67 90%, 3.1 cm tumor, axilla negative, T2 N0 stage II a clinical stage   09/19/2015 -  Neo-Adjuvant Chemotherapy Neoadjuvant chemotherapy with dose dense Adriamycin and Cytoxan 4 followed by Taxol and carboplatin weekly 12    CHIEF COMPLIANT: Cycle 1 day 8 neoadjuvant chemotherapy  INTERVAL HISTORY: Cindy Bradshaw is a 45 year old with above-mentioned history of left breast cancer currently on neoadjuvant chemotherapy with dose dense demise and Cytoxan. Resected 1. She had profound nausea vomiting after cycle 1 of treatment. She was brought in on Friday and Saturday to be given IV fluids and antiemetics. She is feeling a lot better today.  REVIEW OF SYSTEMS:   Constitutional: Denies fevers, chills or abnormal weight loss Eyes: Denies blurriness of vision Ears, nose, mouth, throat, and face: Denies mucositis or sore throat Respiratory: Denies cough, dyspnea or wheezes Cardiovascular: Denies palpitation, chest discomfort Gastrointestinal:  Nausea and vomiting Skin: Denies abnormal skin rashes Lymphatics: Denies new lymphadenopathy or easy bruising Neurological:Denies numbness, tingling or new weaknesses Behavioral/Psych: Mood is stable, no new changes  Extremities: No lower extremity edema All other systems were reviewed with the patient and are negative.  I have reviewed the past medical history, past surgical history, social history and family history with the patient and they are unchanged from previous note.  ALLERGIES:  is allergic to dilaudid; ivp dye; percocet; and  toradol.  MEDICATIONS:  Current Outpatient Prescriptions  Medication Sig Dispense Refill  . dexamethasone (DECADRON) 4 MG tablet Take 1 tablet (4 mg total) by mouth 2 (two) times daily. Take 1 tablets by mouth once a day on the day after chemotherapy and then take 1 tablets two times a day for 2 days. Take with food. 30 tablet 1  . HYDROcodone-acetaminophen (NORCO) 10-325 MG tablet Take 1-2 tablets by mouth every 4 (four) hours as needed (pain). 30 tablet 0  . lidocaine-prilocaine (EMLA) cream Apply to affected area once 30 g 3  . LORazepam (ATIVAN) 0.5 MG tablet Take 1 tablet (0.5 mg total) by mouth at bedtime. 30 tablet 0  . ondansetron (ZOFRAN ODT) 4 MG disintegrating tablet Take 1 tablet (4 mg total) by mouth every 8 (eight) hours as needed for nausea or vomiting. 20 tablet 0  . ondansetron (ZOFRAN) 8 MG tablet Take 1 tablet (8 mg total) by mouth 2 (two) times daily as needed. Start on the third day after chemotherapy. 30 tablet 1  . PROAIR HFA 108 (90 BASE) MCG/ACT inhaler Inhale 2 puffs into the lungs every 4 (four) hours as needed. For shortness of breath  3  . prochlorperazine (COMPAZINE) 10 MG tablet Take 1 tablet (10 mg total) by mouth every 6 (six) hours as needed (Nausea or vomiting). 30 tablet 1   No current facility-administered medications for this visit.    PHYSICAL EXAMINATION: ECOG PERFORMANCE STATUS: 1 - Symptomatic but completely ambulatory  Filed Vitals:   09/26/15 1056  BP: 147/66  Pulse: 70  Temp: 98.7 F (37.1 C)  Resp: 18   Filed Weights   09/26/15 1056  Weight: 230 lb 9.6 oz (104.599 kg)    GENERAL:alert, no distress and comfortable  SKIN: skin color, texture, turgor are normal, no rashes or significant lesions EYES: normal, Conjunctiva are pink and non-injected, sclera clear OROPHARYNX:no exudate, no erythema and lips, buccal mucosa, and tongue normal  NECK: supple, thyroid normal size, non-tender, without nodularity LYMPH:  no palpable lymphadenopathy  in the cervical, axillary or inguinal LUNGS: clear to auscultation and percussion with normal breathing effort HEART: regular rate & rhythm and no murmurs and no lower extremity edema ABDOMEN:abdomen soft, non-tender and normal bowel sounds MUSCULOSKELETAL:no cyanosis of digits and no clubbing  NEURO: alert & oriented x 3 with fluent speech, no focal motor/sensory deficits EXTREMITIES: No lower extremity edema  LABORATORY DATA:  I have reviewed the data as listed   Chemistry      Component Value Date/Time   NA 135* 09/23/2015 1457   NA 134* 09/16/2015 2316   K 4.0 09/23/2015 1457   K 3.4* 09/16/2015 2316   CL 102 09/16/2015 2316   CO2 22 09/23/2015 1457   CO2 24 09/16/2015 2316   BUN 20.7 09/23/2015 1457   BUN 12 09/16/2015 2316   CREATININE 0.8 09/23/2015 1457   CREATININE 0.64 09/16/2015 2316      Component Value Date/Time   CALCIUM 10.0 09/23/2015 1457   CALCIUM 9.6 09/16/2015 2316   ALKPHOS 98 09/23/2015 1457   ALKPHOS 61 09/16/2015 2316   AST 17 09/23/2015 1457   AST 22 09/16/2015 2316   ALT 37 09/23/2015 1457   ALT 33 09/16/2015 2316   BILITOT 0.49 09/23/2015 1457   BILITOT 0.5 09/16/2015 2316       Lab Results  Component Value Date   WBC 36.7* 09/23/2015   HGB 9.9* 09/23/2015   HCT 32.2* 09/23/2015   MCV 80.8 09/23/2015   PLT 454* 09/23/2015   NEUTROABS 35.5* 09/23/2015     ASSESSMENT & PLAN:  Breast cancer of upper-outer quadrant of left female breast ([place]) Left breast biopsy 08/17/2015:1:30 position: Invasive ductal carcinoma, grade 3, ER 0%, PR 0%, HER-2 negative ratio 1.48, Ki-67 90%, 3.1 cm tumor, axilla negative, T2 N0 stage II a clinical stage  Treatment plan: 1. Neoadjuvant chemotherapy with dose dense Adriamycin and Cytoxan 4 followed by Taxol and carboplatin weekly 12 2. Followed by surgery 3. Followed by adjuvant  radiation -------------------------------------------------------------------------------------------------------------------------------------------------- Current treatment: Cycle 1 day 8 dose dense Adriamycin and Cytoxan Echocardiogram 09/13/2015: EF 55-60% Labs were reviewed. Normocytic anemia: work up revealed iron deficiency: Plan to give IV Iron next week with chemo Chemotoxicities: 1. Severe nausea and vomiting: Patient was brought in last Friday to get IV fluids as well as Saturday. She is feeling a lot better today. I recommended reducing the dosage of chemotherapy. Nausea lasted 2-3 days after chemotherapy. 2. Hair loss  Patient will be set up for biopsies of the breasts bilaterally to evaluate the extent of disease on the left breast and the small nodule that was noted on the breast MRI on the right breast.   Monitoring very closely for chemotherapy toxicities. Return to clinic in 1 week for cycle 2   No orders of the defined types were placed in this encounter.   The patient has a good understanding of the overall plan. she agrees with it. she will call with any problems that may develop before the next visit here.   Rulon Eisenmenger, MD 09/26/2015

## 2015-09-27 ENCOUNTER — Other Ambulatory Visit: Payer: Self-pay | Admitting: General Surgery

## 2015-09-27 DIAGNOSIS — N6320 Unspecified lump in the left breast, unspecified quadrant: Principal | ICD-10-CM

## 2015-09-27 DIAGNOSIS — N6310 Unspecified lump in the right breast, unspecified quadrant: Secondary | ICD-10-CM

## 2015-09-28 ENCOUNTER — Telehealth: Payer: Self-pay | Admitting: *Deleted

## 2015-09-28 ENCOUNTER — Ambulatory Visit
Admission: RE | Admit: 2015-09-28 | Discharge: 2015-09-28 | Disposition: A | Discharge status: Home / Self Care | Payer: Federal, State, Local not specified - PPO | Source: Ambulatory Visit | Attending: General Surgery | Admitting: General Surgery

## 2015-09-28 DIAGNOSIS — N6310 Unspecified lump in the right breast, unspecified quadrant: Secondary | ICD-10-CM

## 2015-09-28 DIAGNOSIS — N6320 Unspecified lump in the left breast, unspecified quadrant: Principal | ICD-10-CM

## 2015-09-28 MED ORDER — GADOBENATE DIMEGLUMINE 529 MG/ML IV SOLN
20.0000 mL | Freq: Once | INTRAVENOUS | Status: AC | PRN
  Administered 2015-09-28: 20 mL via INTRAVENOUS

## 2015-09-28 NOTE — Telephone Encounter (Signed)
Per staff message and POF I have scheduled appts. Advised scheduler of appts. JMW  

## 2015-09-30 ENCOUNTER — Other Ambulatory Visit: Payer: Self-pay | Admitting: *Deleted

## 2015-09-30 DIAGNOSIS — C50412 Malignant neoplasm of upper-outer quadrant of left female breast: Secondary | ICD-10-CM

## 2015-09-30 MED ORDER — LORAZEPAM 0.5 MG PO TABS: 0.5000 mg | ORAL_TABLET | Freq: Every day | ORAL | Status: DC

## 2015-10-03 ENCOUNTER — Encounter: Payer: Self-pay | Admitting: Hematology and Oncology

## 2015-10-03 ENCOUNTER — Encounter: Payer: Self-pay | Admitting: *Deleted

## 2015-10-03 ENCOUNTER — Other Ambulatory Visit: Payer: Self-pay | Admitting: *Deleted

## 2015-10-03 ENCOUNTER — Telehealth: Payer: Self-pay | Admitting: Hematology and Oncology

## 2015-10-03 ENCOUNTER — Ambulatory Visit (HOSPITAL_BASED_OUTPATIENT_CLINIC_OR_DEPARTMENT_OTHER): Payer: Federal, State, Local not specified - PPO

## 2015-10-03 ENCOUNTER — Other Ambulatory Visit (HOSPITAL_BASED_OUTPATIENT_CLINIC_OR_DEPARTMENT_OTHER): Payer: Federal, State, Local not specified - PPO

## 2015-10-03 ENCOUNTER — Ambulatory Visit (HOSPITAL_BASED_OUTPATIENT_CLINIC_OR_DEPARTMENT_OTHER): Payer: Federal, State, Local not specified - PPO | Admitting: Hematology and Oncology

## 2015-10-03 VITALS — BP 129/73 | HR 86 | Temp 97.9°F | Resp 18 | Ht 65.0 in | Wt 233.5 lb

## 2015-10-03 DIAGNOSIS — D649 Anemia, unspecified: Secondary | ICD-10-CM | POA: Diagnosis not present

## 2015-10-03 DIAGNOSIS — H578 Other specified disorders of eye and adnexa: Secondary | ICD-10-CM | POA: Diagnosis not present

## 2015-10-03 DIAGNOSIS — Z5111 Encounter for antineoplastic chemotherapy: Secondary | ICD-10-CM | POA: Diagnosis not present

## 2015-10-03 DIAGNOSIS — R110 Nausea: Secondary | ICD-10-CM

## 2015-10-03 DIAGNOSIS — C50412 Malignant neoplasm of upper-outer quadrant of left female breast: Secondary | ICD-10-CM | POA: Diagnosis not present

## 2015-10-03 DIAGNOSIS — H1032 Unspecified acute conjunctivitis, left eye: Secondary | ICD-10-CM | POA: Insufficient documentation

## 2015-10-03 LAB — CBC WITH DIFFERENTIAL/PLATELET
BASO%: 0.3 % (ref 0.0–2.0)
Basophils Absolute: 0 10*3/uL (ref 0.0–0.1)
EOS ABS: 0 10*3/uL (ref 0.0–0.5)
EOS%: 0.3 % (ref 0.0–7.0)
HCT: 31.1 % — ABNORMAL LOW (ref 34.8–46.6)
HGB: 9.8 g/dL — ABNORMAL LOW (ref 11.6–15.9)
LYMPH%: 20.8 % (ref 14.0–49.7)
MCH: 26.1 pg (ref 25.1–34.0)
MCHC: 31.5 g/dL (ref 31.5–36.0)
MCV: 82.9 fL (ref 79.5–101.0)
MONO#: 0.7 10*3/uL (ref 0.1–0.9)
MONO%: 6.7 % (ref 0.0–14.0)
NEUT%: 71.9 % (ref 38.4–76.8)
NEUTROS ABS: 7.9 10*3/uL — AB (ref 1.5–6.5)
NRBC: 1 % — AB (ref 0–0)
PLATELETS: 248 10*3/uL (ref 145–400)
RBC: 3.75 10*6/uL (ref 3.70–5.45)
RDW: 16.3 % — AB (ref 11.2–14.5)
WBC: 11 10*3/uL — AB (ref 3.9–10.3)
lymph#: 2.3 10*3/uL (ref 0.9–3.3)

## 2015-10-03 LAB — COMPREHENSIVE METABOLIC PANEL
ALT: 32 U/L (ref 0–55)
ANION GAP: 8 meq/L (ref 3–11)
AST: 20 U/L (ref 5–34)
Albumin: 3 g/dL — ABNORMAL LOW (ref 3.5–5.0)
Alkaline Phosphatase: 81 U/L (ref 40–150)
BUN: 18.7 mg/dL (ref 7.0–26.0)
CHLORIDE: 107 meq/L (ref 98–109)
CO2: 23 meq/L (ref 22–29)
Calcium: 9.2 mg/dL (ref 8.4–10.4)
Creatinine: 0.8 mg/dL (ref 0.6–1.1)
GLUCOSE: 99 mg/dL (ref 70–140)
Potassium: 4 mEq/L (ref 3.5–5.1)
SODIUM: 138 meq/L (ref 136–145)
TOTAL PROTEIN: 6.3 g/dL — AB (ref 6.4–8.3)
Total Bilirubin: <0.3 mg/dL (ref 0.20–1.20)

## 2015-10-03 MED ORDER — PALONOSETRON HCL INJECTION 0.25 MG/5ML
0.2500 mg | Freq: Once | INTRAVENOUS | Status: AC
Start: 2015-10-03 — End: 2015-10-03
  Administered 2015-10-03: 0.25 mg via INTRAVENOUS

## 2015-10-03 MED ORDER — SODIUM CHLORIDE 0.9 % IJ SOLN
10.0000 mL | INTRAMUSCULAR | Status: DC | PRN
  Administered 2015-10-03: 10 mL
  Filled 2015-10-03: qty 10

## 2015-10-03 MED ORDER — CIPROFLOXACIN HCL 0.3 % OP SOLN: 2.0000 [drp] | OPHTHALMIC | Status: DC

## 2015-10-03 MED ORDER — FERUMOXYTOL INJECTION 510 MG/17 ML
510.0000 mg | Freq: Once | INTRAVENOUS | Status: AC
  Administered 2015-10-03: 510 mg via INTRAVENOUS
  Filled 2015-10-03: qty 17

## 2015-10-03 MED ORDER — ONDANSETRON HCL 8 MG PO TABS: 8.0000 mg | ORAL_TABLET | Freq: Two times a day (BID) | ORAL | Status: DC | PRN

## 2015-10-03 MED ORDER — HEPARIN SOD (PORK) LOCK FLUSH 100 UNIT/ML IV SOLN
500.0000 [IU] | Freq: Once | INTRAVENOUS | Status: AC | PRN
  Administered 2015-10-03: 500 [IU]
  Filled 2015-10-03: qty 5

## 2015-10-03 MED ORDER — CIPROFLOXACIN HCL 0.3 % OP SOLN: 1.0000 [drp] | OPHTHALMIC | Status: DC

## 2015-10-03 MED ORDER — SODIUM CHLORIDE 0.9 % IV SOLN
550.0000 mg/m2 | Freq: Once | INTRAVENOUS | Status: AC
  Administered 2015-10-03: 1240 mg via INTRAVENOUS
  Filled 2015-10-03: qty 62

## 2015-10-03 MED ORDER — DOXORUBICIN HCL CHEMO IV INJECTION 2 MG/ML
54.0000 mg/m2 | Freq: Once | INTRAVENOUS | Status: AC
  Administered 2015-10-03: 120 mg via INTRAVENOUS
  Filled 2015-10-03: qty 60

## 2015-10-03 MED ORDER — PROCHLORPERAZINE MALEATE 10 MG PO TABS: 10.0000 mg | ORAL_TABLET | Freq: Four times a day (QID) | ORAL | Status: DC | PRN

## 2015-10-03 MED ORDER — PALONOSETRON HCL INJECTION 0.25 MG/5ML
INTRAVENOUS | Status: AC
  Filled 2015-10-03: qty 5

## 2015-10-03 MED ORDER — PEGFILGRASTIM 6 MG/0.6ML ~~LOC~~ PSKT
6.0000 mg | PREFILLED_SYRINGE | Freq: Once | SUBCUTANEOUS | Status: AC
  Administered 2015-10-03: 6 mg via SUBCUTANEOUS
  Filled 2015-10-03: qty 0.6

## 2015-10-03 MED ORDER — DEXAMETHASONE 4 MG PO TABS: 4.0000 mg | ORAL_TABLET | ORAL | Status: DC

## 2015-10-03 MED ORDER — SODIUM CHLORIDE 0.9 % IV SOLN
Freq: Once | INTRAVENOUS | Status: AC
  Administered 2015-10-03: 13:00:00 via INTRAVENOUS
  Filled 2015-10-03: qty 5

## 2015-10-03 MED ORDER — SODIUM CHLORIDE 0.9 % IV SOLN
Freq: Once | INTRAVENOUS | Status: AC
  Administered 2015-10-03: 12:00:00 via INTRAVENOUS

## 2015-10-03 NOTE — Progress Notes (Signed)
Patient Care Team: [name], MD as PCP - General (Internal Medicine)  SUMMARY OF ONCOLOGIC HISTORY:   Breast cancer of upper-outer quadrant of left female breast ([place])   08/17/2015 Initial Diagnosis left breast biopsy 1:30 position: Invasive ductal carcinoma, grade 3, ER 0%, PR 0%, HER-2 negative ratio 1.48, Ki-67 90%, 3.1 cm tumor, axilla negative, T2 N0 stage II a clinical stage   09/19/2015 -  Neo-Adjuvant Chemotherapy Neoadjuvant chemotherapy with dose dense Adriamycin and Cytoxan 4 followed by Taxol and carboplatin weekly 12   09/28/2015 Procedure  Left breast biopsy anterior third left upper outer quadrant: PASH;  upper inner quadrant: PASH    CHIEF COMPLIANT:  Cycle 2 of dose dense Adriamycin and Cytoxan  INTERVAL HISTORY: Cindy Bradshaw is a  45 year old above-mentioned history of left breast cancer currently of neoadjuvant chemotherapy with dosages Adriamycin and Cytoxan. After the first cycle she had profound nausea and had to require IV fluids. Since then she had felt very well until yesterday. She is scratched her eye and since then her eyes being red and in discomfort. She has profound trouble looking at bright lights in her eyes watering. She is using over-the-counter eyedrops for lubrication of the eyes which appeared to be helping her somewhat.  REVIEW OF SYSTEMS:   Constitutional: Denies fevers, chills or abnormal weight loss Eyes:  Irritation of the left eye with redness and watering and photophobia Ears, nose, mouth, throat, and face: Denies mucositis or sore throat Respiratory: Denies cough, dyspnea or wheezes Cardiovascular: Denies palpitation, chest discomfort Gastrointestinal:  Denies nausea, heartburn or change in bowel habits Skin: Denies abnormal skin rashes Lymphatics: Denies new lymphadenopathy or easy bruising Neurological:Denies numbness, tingling or new weaknesses Behavioral/Psych: Mood is stable, no new changes  Extremities: No lower  extremity edema All other systems were reviewed with the patient and are negative.  I have reviewed the past medical history, past surgical history, social history and family history with the patient and they are unchanged from previous note.  ALLERGIES:  is allergic to dilaudid; ivp dye; percocet; and toradol.  MEDICATIONS:  Current Outpatient Prescriptions  Medication Sig Dispense Refill  . ciprofloxacin (CILOXAN) 0.3 % ophthalmic solution Place 1 drop into the left eye as directed. every 2 hrs while awake for 2 days. Then 1 drop every 4 hours while awake for the next 5 days. 5 mL 0  . dexamethasone (DECADRON) 4 MG tablet Take 1 tablet (4 mg total) by mouth 2 (two) times daily. Take 1 tablets by mouth once a day on the day after chemotherapy and then take 1 tablets two times a day for 2 days. Take with food. 30 tablet 1  . diphenhydrAMINE (BENADRYL) 50 MG capsule TAKE 1 HOUR PRIOR TO RADIOLOGIC PROCEDURE  0  . HYDROcodone-acetaminophen (NORCO) 10-325 MG tablet Take 1-2 tablets by mouth every 4 (four) hours as needed (pain). 30 tablet 0  . lidocaine-prilocaine (EMLA) cream Apply to affected area once 30 g 3  . LORazepam (ATIVAN) 0.5 MG tablet Take 1 tablet (0.5 mg total) by mouth at bedtime. 30 tablet 0  . ondansetron (ZOFRAN ODT) 4 MG disintegrating tablet Take 1 tablet (4 mg total) by mouth every 8 (eight) hours as needed for nausea or vomiting. 20 tablet 0  . ondansetron (ZOFRAN) 8 MG tablet Take 1 tablet (8 mg total) by mouth 2 (two) times daily as needed. Start on the third day after chemotherapy. 30 tablet 1  . predniSONE (DELTASONE) 50 MG tablet TAKE 1  TABLET BY MOUTH 13 HRS, 7 HRS AND 1 HOUR PRIOR TO RADIOLOGIC PROCEDURE  0  . PROAIR HFA 108 (90 BASE) MCG/ACT inhaler Inhale 2 puffs into the lungs every 4 (four) hours as needed. For shortness of breath  3  . prochlorperazine (COMPAZINE) 10 MG tablet Take 1 tablet (10 mg total) by mouth every 6 (six) hours as needed (Nausea or vomiting).  30 tablet 1   No current facility-administered medications for this visit.   Facility-Administered Medications Ordered in Other Visits  Medication Dose Route Frequency Provider Last Rate Last Dose  . cyclophosphamide (CYTOXAN) 1,240 mg in sodium chloride 0.9 % 250 mL chemo infusion  550 mg/m2 (Treatment Plan Actual) Intravenous Once Nicholas Lose, MD      . DOXOrubicin (ADRIAMYCIN) chemo injection 120 mg  54 mg/m2 (Treatment Plan Actual) Intravenous Once Nicholas Lose, MD      . ferumoxytol (FERAHEME) 510 mg in sodium chloride 0.9 % 100 mL IVPB  510 mg Intravenous Once Nicholas Lose, MD   510 mg at 10/03/15 1314  . heparin lock flush 100 unit/mL  500 Units Intracatheter Once PRN Nicholas Lose, MD      . pegfilgrastim (NEULASTA ONPRO KIT) injection 6 mg  6 mg Subcutaneous Once Nicholas Lose, MD      . sodium chloride 0.9 % injection 10 mL  10 mL Intracatheter PRN Nicholas Lose, MD        PHYSICAL EXAMINATION: ECOG PERFORMANCE STATUS: 1 - Symptomatic but completely ambulatory  Filed Vitals:   10/03/15 1040  BP: 129/73  Pulse: 86  Temp: 97.9 F (36.6 C)  Resp: 18   Filed Weights   10/03/15 1040  Weight: 233 lb 8 oz (105.915 kg)    GENERAL:alert, no distress and comfortable SKIN: skin color, texture, turgor are normal, no rashes or significant lesions EYES: redness of the conjunctiva and possibly corneal abrasion OROPHARYNX:no exudate, no erythema and lips, buccal mucosa, and tongue normal  NECK: supple, thyroid normal size, non-tender, without nodularity LYMPH:  no palpable lymphadenopathy in the cervical, axillary or inguinal LUNGS: clear to auscultation and percussion with normal breathing effort HEART: regular rate & rhythm and no murmurs and no lower extremity edema ABDOMEN:abdomen soft, non-tender and normal bowel sounds MUSCULOSKELETAL:no cyanosis of digits and no clubbing  NEURO: alert & oriented x 3 with fluent speech, no focal motor/sensory deficits EXTREMITIES: No lower  extremity edema  LABORATORY DATA:  I have reviewed the data as listed   Chemistry      Component Value Date/Time   NA 138 10/03/2015 1005   NA 134* 09/16/2015 2316   K 4.0 10/03/2015 1005   K 3.4* 09/16/2015 2316   CL 102 09/16/2015 2316   CO2 23 10/03/2015 1005   CO2 24 09/16/2015 2316   BUN 18.7 10/03/2015 1005   BUN 12 09/16/2015 2316   CREATININE 0.8 10/03/2015 1005   CREATININE 0.64 09/16/2015 2316      Component Value Date/Time   CALCIUM 9.2 10/03/2015 1005   CALCIUM 9.6 09/16/2015 2316   ALKPHOS 81 10/03/2015 1005   ALKPHOS 61 09/16/2015 2316   AST 20 10/03/2015 1005   AST 22 09/16/2015 2316   ALT 32 10/03/2015 1005   ALT 33 09/16/2015 2316   BILITOT <0.30 10/03/2015 1005   BILITOT 0.5 09/16/2015 2316       Lab Results  Component Value Date   WBC 11.0* 10/03/2015   HGB 9.8* 10/03/2015   HCT 31.1* 10/03/2015   MCV 82.9 10/03/2015  PLT 248 10/03/2015   NEUTROABS 7.9* 10/03/2015     ASSESSMENT & PLAN:  Breast cancer of upper-outer quadrant of left female breast ([facility]) Left breast biopsy 08/17/2015:1:30 position: Invasive ductal carcinoma, grade 3, ER 0%, PR 0%, HER-2 negative ratio 1.48, Ki-67 90%, 3.1 cm tumor, axilla negative, T2 N0 stage II a clinical stage  Treatment plan: 1. Neoadjuvant chemotherapy with dose dense Adriamycin and Cytoxan 4 followed by Taxol and carboplatin weekly 12 2. Followed by surgery 3. Followed by adjuvant radiation -------------------------------------------------------------------------------------------------------------------------------------------------- Current treatment: Cycle 2 day 1 dose dense Adriamycin and Cytoxan Echocardiogram 09/13/2015: EF 55-60% Labs were reviewed. Normocytic anemia: work up revealed iron deficiency: Plan to give IV Iron next week with chemo Chemotoxicities: 1. Severe nausea and vomiting: Patient was brought in last Friday to get IV fluids as well as Saturday. She is feeling a lot better  today. I recommended reducing the dosage of chemotherapy. Nausea lasted 2-3 days after chemotherapy. 2. Hair loss 3.  Conjunctivitis versus corneal abrasion: patient has redness of the eye with discomfort in the eye accompanied by light intolerance. : Patient is using over-the-counter eyedrops which appeared to be helping her pain. It is unclear if there is any active infection, I will start her on ciprofloxacin eyedrops to be used every 4 hours when awake.  Breast biopsies x 2: 09/28/2015 : PASH  Anemia: we will send for hemoglobin electrophoresis today. Her daughter and her mother are all anemic. Monitoring very closely for chemotherapy toxicities. Return to clinic in 2 weeks for cycle 3    Orders Placed This Encounter  Procedures  . Hemoglobinopathy evaluation    Standing Status: Future     Number of Occurrences:      Standing Expiration Date: 11/06/2016   The patient has a good understanding of the overall plan. she agrees with it. she will call with any problems that may develop before the next visit here.   Rulon Eisenmenger, MD 10/03/2015

## 2015-10-03 NOTE — Telephone Encounter (Signed)
Appointments made and avs given to patient °

## 2015-10-03 NOTE — Patient Instructions (Addendum)
[facility] Discharge Instructions for Patients Receiving Chemotherapy  Today you received the following chemotherapy agents: Adriamycin and cytoxan.  To help prevent nausea and vomiting after your treatment, we encourage you to take your nausea medication: Compazine 10 mg every 6 hours as needed.   If you develop nausea and vomiting that is not controlled by your nausea medication, call the clinic.   BELOW ARE SYMPTOMS THAT SHOULD BE REPORTED IMMEDIATELY:  *FEVER GREATER THAN 100.5 F  *CHILLS WITH OR WITHOUT FEVER  NAUSEA AND VOMITING THAT IS NOT CONTROLLED WITH YOUR NAUSEA MEDICATION  *UNUSUAL SHORTNESS OF BREATH  *UNUSUAL BRUISING OR BLEEDING  TENDERNESS IN MOUTH AND THROAT WITH OR WITHOUT PRESENCE OF ULCERS  *URINARY PROBLEMS  *BOWEL PROBLEMS  UNUSUAL RASH Items with * indicate a potential emergency and should be followed up as soon as possible.  Feel free to call the clinic you have any questions or concerns. The clinic phone number is [phone].  Please show the [facility] at check-in to the Emergency Department and triage nurse.  Ferumoxytol injection What is this medicine? FERUMOXYTOL is an iron complex. Iron is used to make healthy red blood cells, which carry oxygen and nutrients throughout the body. This medicine is used to treat iron deficiency anemia in people with chronic kidney disease. This medicine may be used for other purposes; ask your health care provider or pharmacist if you have questions. What should I tell my health care provider before I take this medicine? They need to know if you have any of these conditions: -anemia not caused by low iron levels -high levels of iron in the blood -magnetic resonance imaging (MRI) test scheduled -an unusual or allergic reaction to iron, other medicines, foods, dyes, or preservatives -pregnant or trying to get pregnant -breast-feeding How should I use this medicine? This medicine is for  injection into a vein. It is given by a health care professional in a hospital or clinic setting. Talk to your pediatrician regarding the use of this medicine in children. Special care may be needed. Overdosage: If you think you have taken too much of this medicine contact a poison control center or emergency room at once. NOTE: This medicine is only for you. Do not share this medicine with others. What if I miss a dose? It is important not to miss your dose. Call your doctor or health care professional if you are unable to keep an appointment. What may interact with this medicine? This medicine may interact with the following medications: -other iron products This list may not describe all possible interactions. Give your health care provider a list of all the medicines, herbs, non-prescription drugs, or dietary supplements you use. Also tell them if you smoke, drink alcohol, or use illegal drugs. Some items may interact with your medicine. What should I watch for while using this medicine? Visit your doctor or healthcare professional regularly. Tell your doctor or healthcare professional if your symptoms do not start to get better or if they get worse. You may need blood work done while you are taking this medicine. You may need to follow a special diet. Talk to your doctor. Foods that contain iron include: whole grains/cereals, dried fruits, beans, or peas, leafy green vegetables, and organ meats (liver, kidney). What side effects may I notice from receiving this medicine? Side effects that you should report to your doctor or health care professional as soon as possible: -allergic reactions like skin rash, itching or hives, swelling  of the face, lips, or tongue -breathing problems -changes in blood pressure -feeling faint or lightheaded, falls -fever or chills -flushing, sweating, or hot feelings -swelling of the ankles or feet Side effects that usually do not require medical attention  (Report these to your doctor or health care professional if they continue or are bothersome.): -diarrhea -headache -nausea, vomiting -stomach pain This list may not describe all possible side effects. Call your doctor for medical advice about side effects. You may report side effects to FDA at 1-800-FDA-1088. Where should I keep my medicine? This drug is given in a hospital or clinic and will not be stored at home. NOTE: This sheet is a summary. It may not cover all possible information. If you have questions about this medicine, talk to your doctor, pharmacist, or health care provider.    2016, [employer]vier/Gold Standard. (2012-04-04 15:23:36)

## 2015-10-03 NOTE — Assessment & Plan Note (Addendum)
Left breast biopsy 08/17/2015:1:30 position: Invasive ductal carcinoma, grade 3, ER 0%, PR 0%, HER-2 negative ratio 1.48, Ki-67 90%, 3.1 cm tumor, axilla negative, T2 N0 stage II a clinical stage  Treatment plan: 1. Neoadjuvant chemotherapy with dose dense Adriamycin and Cytoxan 4 followed by Taxol and carboplatin weekly 12 2. Followed by surgery 3. Followed by adjuvant radiation -------------------------------------------------------------------------------------------------------------------------------------------------- Current treatment: Cycle 2 day 1 dose dense Adriamycin and Cytoxan Echocardiogram 09/13/2015: EF 55-60% Labs were reviewed. Normocytic anemia: work up revealed iron deficiency: Plan to give IV Iron next week with chemo Chemotoxicities: 1. Severe nausea and vomiting: Patient was brought in last Friday to get IV fluids as well as Saturday. She is feeling a lot better today. I recommended reducing the dosage of chemotherapy. Nausea lasted 2-3 days after chemotherapy. 2. Hair loss 3.  Conjunctivitis versus corneal abrasion: patient has redness of the eye with discomfort in the eye accompanied by light intolerance. : Patient is using over-the-counter eyedrops which appeared to be helping her pain. It is unclear if there is any active infection, I will start her on ciprofloxacin eyedrops to be used every 4 hours when awake.  Breast biopsies x 2: 09/28/2015 : [place]  Monitoring very closely for chemotherapy toxicities. Return to clinic in 2 weeks for cycle 3

## 2015-10-03 NOTE — Progress Notes (Signed)
Pt reports nausea week after 1st chemo.  Nausea meds need to be refilled.  Pt having trouble with left eye - felt scratchy and itchy yesterday.  Used an OTC drop - did not help.  Today eye is red and she sensitive to light and pain upper eyelid.No discharge or crusting.

## 2015-10-03 NOTE — Addendum Note (Signed)
Addended by: Prentiss Bells on: 10/03/2015 02:58 PM   Modules accepted: Orders, Medications

## 2015-10-05 ENCOUNTER — Encounter: Payer: Federal, State, Local not specified - PPO | Admitting: Nutrition

## 2015-10-05 ENCOUNTER — Ambulatory Visit: Payer: Federal, State, Local not specified - PPO | Admitting: Hematology and Oncology

## 2015-10-07 ENCOUNTER — Telehealth: Payer: Self-pay | Admitting: Genetic Counselor

## 2015-10-07 NOTE — Telephone Encounter (Signed)
Discussed with Cindy Bradshaw that her genetic test results were negative for pathogenic mutations within any of 20 genes that would cause her to be at an increased risk for breast, ovarian, or other related cancers.  Additionally, no uncertain changes were found.  Reviewed that a negative test result cannot totally rule out a genetic cause for the personal and family history of breast cancer, since we cannot be sure that we are testing all the right genes at this point in time.  However, we also discussed that this result may be reassuring, in light of there being no history of breast or ovarian cancer in Cindy Bradshaw's mother (and also no breast cancer on her father's side of the family).  Encouraged Cindy Bradshaw to call and update the family history with [place] if anyone else in her family is diagnosed with cancer in the future.  Discussed that this negative result does not indicate any specific changes to her treatment plan/future cancer screening.  She should continue to follow her oncologist and primary doctors's recommendations.  Discussed that women in the family are considered to be at a somewhat increased risk for breast cancer, simply based on the family history.  Cindy Bradshaw daughter can begin annual mammogram screening at the age of 46 based on her diagnosis at 62.  Cindy Bradshaw sister should also continue having annual mammogram screening.  Cindy Bradshaw is welcome to call or email me with any questions she may have.

## 2015-10-10 ENCOUNTER — Ambulatory Visit: Payer: Self-pay | Admitting: Genetic Counselor

## 2015-10-10 ENCOUNTER — Telehealth: Payer: Self-pay | Admitting: *Deleted

## 2015-10-10 ENCOUNTER — Encounter (HOSPITAL_COMMUNITY): Payer: Self-pay | Admitting: Emergency Medicine

## 2015-10-10 ENCOUNTER — Other Ambulatory Visit: Payer: Self-pay | Admitting: *Deleted

## 2015-10-10 ENCOUNTER — Emergency Department (HOSPITAL_COMMUNITY)
Admission: EM | Admit: 2015-10-10 | Discharge: 2015-10-10 | Disposition: A | Discharge status: Home / Self Care | Payer: Federal, State, Local not specified - PPO | Attending: Emergency Medicine | Admitting: Emergency Medicine

## 2015-10-10 ENCOUNTER — Emergency Department (HOSPITAL_COMMUNITY)
Admission: EM | Admit: 2015-10-10 | Discharge: 2015-10-10 | Discharge status: Absent without leave | Payer: Federal, State, Local not specified - PPO | Attending: Emergency Medicine | Admitting: Emergency Medicine

## 2015-10-10 ENCOUNTER — Telehealth: Payer: Self-pay | Admitting: Hematology and Oncology

## 2015-10-10 DIAGNOSIS — H53143 Visual discomfort, bilateral: Secondary | ICD-10-CM

## 2015-10-10 DIAGNOSIS — Z853 Personal history of malignant neoplasm of breast: Secondary | ICD-10-CM | POA: Insufficient documentation

## 2015-10-10 DIAGNOSIS — H578 Other specified disorders of eye and adnexa: Secondary | ICD-10-CM | POA: Diagnosis present

## 2015-10-10 DIAGNOSIS — Z79899 Other long term (current) drug therapy: Secondary | ICD-10-CM | POA: Insufficient documentation

## 2015-10-10 DIAGNOSIS — Z8719 Personal history of other diseases of the digestive system: Secondary | ICD-10-CM | POA: Insufficient documentation

## 2015-10-10 DIAGNOSIS — C50412 Malignant neoplasm of upper-outer quadrant of left female breast: Secondary | ICD-10-CM

## 2015-10-10 DIAGNOSIS — Z803 Family history of malignant neoplasm of breast: Secondary | ICD-10-CM

## 2015-10-10 DIAGNOSIS — Z1379 Encounter for other screening for genetic and chromosomal anomalies: Secondary | ICD-10-CM

## 2015-10-10 DIAGNOSIS — J45909 Unspecified asthma, uncomplicated: Secondary | ICD-10-CM | POA: Diagnosis not present

## 2015-10-10 DIAGNOSIS — M1990 Unspecified osteoarthritis, unspecified site: Secondary | ICD-10-CM | POA: Insufficient documentation

## 2015-10-10 DIAGNOSIS — Z809 Family history of malignant neoplasm, unspecified: Secondary | ICD-10-CM

## 2015-10-10 DIAGNOSIS — H109 Unspecified conjunctivitis: Secondary | ICD-10-CM | POA: Diagnosis not present

## 2015-10-10 HISTORY — DX: Migraine, unspecified, not intractable, without status migrainosus: G43.909

## 2015-10-10 MED ORDER — NAPROXEN 500 MG PO TABS: 500.0000 mg | ORAL_TABLET | Freq: Two times a day (BID) | ORAL | Status: DC

## 2015-10-10 MED ORDER — OXYCODONE-ACETAMINOPHEN 5-325 MG PO TABS
1.0000 | ORAL_TABLET | Freq: Once | ORAL | Status: AC
  Administered 2015-10-10: 1 via ORAL
  Filled 2015-10-10: qty 1

## 2015-10-10 MED ORDER — DIPHENHYDRAMINE HCL 25 MG PO CAPS
ORAL_CAPSULE | ORAL | Status: AC
  Filled 2015-10-10: qty 1

## 2015-10-10 MED ORDER — FLUORESCEIN SODIUM 1 MG OP STRP
1.0000 | ORAL_STRIP | Freq: Once | OPHTHALMIC | Status: AC
  Administered 2015-10-10: 1 via OPHTHALMIC
  Filled 2015-10-10: qty 1

## 2015-10-10 MED ORDER — OXYCODONE-ACETAMINOPHEN 5-325 MG PO TABS: 1.0000 | ORAL_TABLET | ORAL | Status: DC | PRN

## 2015-10-10 MED ORDER — TETRACAINE HCL 0.5 % OP SOLN
2.0000 [drp] | Freq: Once | OPHTHALMIC | Status: AC
  Administered 2015-10-10: 2 [drp] via OPHTHALMIC
  Filled 2015-10-10: qty 4

## 2015-10-10 MED ORDER — DIPHENHYDRAMINE HCL 25 MG PO CAPS
25.0000 mg | ORAL_CAPSULE | Freq: Once | ORAL | Status: AC
  Administered 2015-10-10: 25 mg via ORAL
  Filled 2015-10-10: qty 1

## 2015-10-10 NOTE — ED Notes (Signed)
Patient woke up with both eyes severely burning, itching, and watering. Pt states this has happened before and was given antibiotic drops to take. Says she tried using them today without relief. Sclera both red, irritated, conjunctiva pink. Pt eyes very sensitive to light. Hx of breast cancer, last chemo 10/03/15

## 2015-10-10 NOTE — ED Notes (Signed)
Pt complaint of bilateral eye reddening, watering, and light sensitivity post washing face with water at 0600; last chemotherapy treatment 10/03/15.

## 2015-10-10 NOTE — Telephone Encounter (Signed)
per pof to sch referral to Dr Ellie Lunch office appt made per urgent pof [phone] Tiffany in HIM email to forward notes adv appt 10/11/15

## 2015-10-10 NOTE — ED Provider Notes (Signed)
CSN: [id number]     Arrival date & time 10/10/15  1656 History   First MD Initiated Contact with Patient 10/10/15 1659     Chief Complaint  Patient presents with  . Eye Problem  . Cancer     (Consider location/radiation/quality/duration/timing/severity/associated sxs/prior Treatment) HPI Patient had a left eye that was painful and red the last week. She saw her oncologist and they gave her some ciprofloxacin to use. She reports however by the next day it was already better so she never ended up using the drops. This morning however she awakened and had pain and redness in both eyes. She reports is a burning pain and she is very sensitive to light. She reports her vision seems blurred. She has not had any yellow drainage or discharge from the eye. No fevers or chills. No swelling. Patient is currently undergoing chemotherapy for breast cancer. Past Medical History  Diagnosis Date  . Asthma   . Breast cancer ([place])   . Arthritis   . Back disorder     degenerative disk disease  . Pap smear for cervical cancer screening 08/12/15  . Mammogram abnormal 08/24/15    first  . GERD (gastroesophageal reflux disease)    Past Surgical History  Procedure Laterality Date  . Cholecystectomy    . Cesarean section    . Portacath placement [name] 09/12/2015    Procedure: INSERTION PORT-A-CATH;  Surgeon: Excell Seltzer, MD;  Location: [facility];  Service: General;  Laterality: [name];   Family History  Problem Relation Age of Onset  . Lung cancer Mother 99    2 different types of lung cancer; metastasis to brain; smoker  . Other Mother     hx of hysterectomy for unspecified reason  . Bone cancer Maternal Uncle     dx. early 21s  . Breast cancer Maternal Grandmother     dx. early 44s, s/p mastectomy  . Cancer Paternal Grandfather     unspecified type of cancer, dx. late 45s  . Congestive Heart Failure Father     smoker  . Stroke Father   . Cirrhosis Maternal Grandfather   . Heart Problems Maternal  Grandfather   . Lung cancer Other     (maternal great uncle; MGM's brother); had a coal stove  . Cancer Cousin     unspecified type; d. early age (paternal first cousin once-removed)  . Cancer Cousin     dx. as a kid; in remission today; (paternal 2nd cousin)  . Cancer Cousin     unspecified type; d. early 37s; (maternal 1st cousin)   Social History  Substance Use Topics  . Smoking status: Never Smoker   . Smokeless tobacco: Never Used  . Alcohol Use: 0.0 oz/week    0 Standard drinks or equivalent per week     Comment: occassional glass of wine   OB History    No data available     Review of Systems 10 Systems reviewed and are negative for acute change except as noted in the HPI.   Allergies  Dilaudid; Ivp dye; Percocet; and Toradol  Home Medications   Prior to Admission medications   Medication Sig Start Date End Date Taking? Authorizing Provider  ciprofloxacin (CILOXAN) 0.3 % ophthalmic solution Place 1 drop into the left eye as directed. every 2 hrs while awake for 2 days. Then 1 drop every 4 hours while awake for the next 5 days. 10/03/15  Yes Nicholas Lose, MD  dexamethasone (DECADRON) 4 MG tablet Take 1  tablet (4 mg total) by mouth as directed. With food. Once the day after chemo, then 1 tablets 2 times a day for 2 days 10/03/15  Yes Nicholas Lose, MD  diphenhydrAMINE (BENADRYL) 50 MG capsule TAKE 1 HOUR PRIOR TO RADIOLOGIC PROCEDURE 09/26/15  Yes Historical Provider, MD  HYDROcodone-acetaminophen (NORCO) 10-325 MG tablet Take 1-2 tablets by mouth every 4 (four) hours as needed (pain). 09/12/15  Yes Excell Seltzer, MD  lidocaine-prilocaine (EMLA) cream Apply to affected area once 09/07/15  Yes Nicholas Lose, MD  LORazepam (ATIVAN) 0.5 MG tablet Take 1 tablet (0.5 mg total) by mouth at bedtime. 09/30/15  Yes Nicholas Lose, MD  ondansetron (ZOFRAN ODT) 4 MG disintegrating tablet Take 1 tablet (4 mg total) by mouth every 8 (eight) hours as needed for nausea or vomiting. 09/17/15  Yes  Leo Grosser, MD  ondansetron (ZOFRAN) 8 MG tablet Take 1 tablet (8 mg total) by mouth 2 (two) times daily as needed. Start on the third day after chemotherapy. 10/03/15  Yes Nicholas Lose, MD  predniSONE (DELTASONE) 50 MG tablet TAKE 1 TABLET BY MOUTH 13 HRS, 7 HRS AND 1 HOUR PRIOR TO RADIOLOGIC PROCEDURE 09/26/15  Yes Historical Provider, MD  prochlorperazine (COMPAZINE) 10 MG tablet Take 1 tablet (10 mg total) by mouth every 6 (six) hours as needed (Nausea or vomiting). 10/03/15  Yes Nicholas Lose, MD  naproxen (NAPROSYN) 500 MG tablet Take 1 tablet (500 mg total) by mouth 2 (two) times daily. 10/10/15   Charlesetta Shanks, MD  oxyCODONE-acetaminophen (PERCOCET/ROXICET) 5-325 MG tablet Take 1-2 tablets by mouth every 4 (four) hours as needed for severe pain. 10/10/15   Charlesetta Shanks, MD  PROAIR HFA 108 (90 BASE) MCG/ACT inhaler Inhale 2 puffs into the lungs every 4 (four) hours as needed. For shortness of breath 03/28/15   Historical Provider, MD   BP 119/79 mmHg  Pulse 105  Temp(Src) 98.5 F (36.9 C) (Oral)  Resp 16  SpO2 98%  LMP 10/04/2015 Physical Exam  Constitutional: She is oriented to person, place, and time.  Patient is tearful and keeping her face covered in a dark room. No respiratory distress. Clear mental status.  HENT:  Head: Normocephalic and atraumatic.  Eyes: EOM are normal. Pupils are equal, round, and reactive to light.  Bilateral conjunctiva has mild diffuse inflammation. There is no proptosis or periorbital swelling. Tono-Pen measurement shows intraocular pressure of 20 on the right and 17 on the left. Fluoroscopy seen, no dye uptake on the cornea. Slit lamp no cell or flare. Patient does not appear to have concentrated perilymbic inflammation to suggest iritis.  Neck: Neck supple.  Pulmonary/Chest: Effort normal.  Neurological: She is alert and oriented to person, place, and time. Coordination normal.  Skin: Skin is warm and dry.    ED Course  Procedures (including critical  care time) Labs Review Labs Reviewed - No data to display  Imaging Review No results found. I have personally reviewed and evaluated these images and lab results as part of my medical decision-making.   EKG Interpretation None     Consult: Patient's case was reviewed with Dr. Ellie Lunch. She does advise to have the patient initiate her ciprofloxacin today and see her in the office tomorrow morning at 8 for recheck. MDM   Final diagnoses:  Photophobia of both eyes  Bilateral conjunctivitis   Patient presents with bilateral ocular pain and photophobia. At this time she does not appear to have acute glaucoma. There is no indication of ulcers or herpetic type  lesions on the cornea. There is no significant concentration of erythema around the limbus. Iritis however is that consideration with the degree of photophobia reported by the patient. Recommendations as per Dr. Ellie Lunch will be followed and patient can have a formal ophthalmology evaluation in the morning.    Charlesetta Shanks, MD 10/10/15 [id number]

## 2015-10-10 NOTE — Discharge Instructions (Signed)
Possible Bacterial Conjunctivitis You need to be seen by the eye doctor tomorrow morning. Your case has been discussed with Dr. Ellie Lunch. Go to her office at 8:30 tomorrow morning for recheck. Use the antibiotic eyedrops prescribed by your physician starting tonight. Bacterial conjunctivitis, commonly called pink eye, is an inflammation of the clear membrane that covers the white part of the eye (conjunctiva). The inflammation can also happen on the underside of the eyelids. The blood vessels in the conjunctiva become inflamed, causing the eye to become red or pink. Bacterial conjunctivitis may spread easily from one eye to another and from person to person (contagious).  CAUSES  Bacterial conjunctivitis is caused by bacteria. The bacteria may come from your own skin, your upper respiratory tract, or from someone else with bacterial conjunctivitis. SYMPTOMS  The normally white color of the eye or the underside of the eyelid is usually pink or red. The pink eye is usually associated with irritation, tearing, and some sensitivity to light. Bacterial conjunctivitis is often associated with a thick, yellowish discharge from the eye. The discharge may turn into a crust on the eyelids overnight, which causes your eyelids to stick together. If a discharge is present, there may also be some blurred vision in the affected eye. DIAGNOSIS  Bacterial conjunctivitis is diagnosed by your caregiver through an eye exam and the symptoms that you report. Your caregiver looks for changes in the surface tissues of your eyes, which may point to the specific type of conjunctivitis. A sample of any discharge may be collected on a cotton-tip swab if you have a severe case of conjunctivitis, if your cornea is affected, or if you keep getting repeat infections that do not respond to treatment. The sample will be sent to a lab to see if the inflammation is caused by a bacterial infection and to see if the infection will respond to  antibiotic medicines. TREATMENT   Bacterial conjunctivitis is treated with antibiotics. Antibiotic eyedrops are most often used. However, antibiotic ointments are also available. Antibiotics pills are sometimes used. Artificial tears or eye washes may ease discomfort. HOME CARE INSTRUCTIONS   To ease discomfort, apply a cool, clean washcloth to your eye for 10-20 minutes, 3-4 times a day.  Gently wipe away any drainage from your eye with a warm, wet washcloth or a cotton ball.  Wash your hands often with soap and water. Use paper towels to dry your hands.  Do not share towels or washcloths. This may spread the infection.  Change or wash your pillowcase every day.  You should not use eye makeup until the infection is gone.  Do not operate machinery or drive if your vision is blurred.  Stop using contact lenses. Ask your caregiver how to sterilize or replace your contacts before using them again. This depends on the type of contact lenses that you use.  When applying medicine to the infected eye, do not touch the edge of your eyelid with the eyedrop bottle or ointment tube. SEEK IMMEDIATE MEDICAL CARE IF:   Your infection has not improved within 3 days after beginning treatment.  You had yellow discharge from your eye and it returns.  You have increased eye pain.  Your eye redness is spreading.  Your vision becomes blurred.  You have a fever or persistent symptoms for more than 2-3 days.  You have a fever and your symptoms suddenly get worse.  You have facial pain, redness, or swelling. MAKE SURE YOU:   Understand these instructions.  Will watch your condition.  Will get help right away if you are not doing well or get worse.   This information is not intended to replace advice given to you by your health care provider. Make sure you discuss any questions you have with your health care provider.   Document Released: 08/20/2005 Document Revised: 09/10/2014 Document  Reviewed: 01/21/2012 Elsevier Interactive Patient Education [employer].

## 2015-10-10 NOTE — Telephone Encounter (Signed)
Received call from patient stating her left eye had gotten better but she woke this morning and it was red,painful and sensitive to light again.  Per Dr. Lindi Adie we will refer her to opthmalogist urgently.  I have placed a referral and urgent POF.

## 2015-10-11 ENCOUNTER — Encounter (HOSPITAL_COMMUNITY): Payer: Self-pay | Admitting: Emergency Medicine

## 2015-10-13 NOTE — Progress Notes (Signed)
GENETIC TEST RESULT  HPI: Cindy Bradshaw was previously seen in the [place] clinic due to a personal history of triple negative breast cancer at 64, family history of breast cancer, and concerns regarding a hereditary predisposition to cancer. Please refer to our prior cancer genetics clinic note from September 20, 2015 for more information regarding Cindy Bradshaw's medical, social and family histories, and our assessment and recommendations, at the time. [name] recent genetic test results were disclosed to her, as were recommendations warranted by these results. These results and recommendations are discussed in more detail below.  GENETIC TEST RESULTS: At the time of Cindy Bradshaw visit on 09/20/15, we recommended she pursue genetic testing of the 20-gene Breast/Ovarian Cancer Panel through [facility].  The Breast/Ovarian Cancer Panel offered by [facility] Hope Pigeon, MD) includes sequencing and deletion/duplication analysis for the following 19 genes:  ATM, BARD1, BRCA1, BRCA2, BRIP1, CDH1, CHEK2, FANCC, MLH1, MSH2, MSH6, NBN, PALB2, PMS2, PTEN, RAD51C, RAD51D, TP53, and XRCC2.  This panel also includes deletion/duplication analysis (without sequencing) for one gene, EPCAM.  Those results are now back, the report date for which is October 06, 2015.  Genetic testing was normal, and did not reveal a deleterious mutation in these genes.  Additionally, no variants of uncertain significance (VUSes) were found.  The test report will be scanned into EPIC and will be located under the Results Review tab in the Pathology>Molecular Pathology section.   We discussed with Cindy Bradshaw that since the current genetic testing is not perfect, it is possible there may be a gene mutation in one of these genes that current testing cannot detect, but that chance is small. We also discussed, that it is possible that another gene that has not yet been discovered, or that we have not yet tested, is  responsible for the cancer diagnoses in the family, and it is, therefore, important to remain in touch with cancer genetics in the future so that we can continue to offer [name] the most up to date genetic testing.   CANCER SCREENING RECOMMENDATIONS:  We still do not have an explanation for why Cindy Bradshaw had breast cancer at a young age or for the family history of breast cancer.  This result may be reassuring and indicate that Cindy Bradshaw likely does not have an increased risk for a future cancer due to a mutation in one of these genes. This normal test might then suggest that Cindy Bradshaw's cancer was most likely not due to an inherited predisposition associated with one of these genes.  There are some reassuring characteristics of Cindy Bradshaw family history as well, including that her mother lived to age 55 and was never diagnosed with breast cancer and that there is no history of breast cancer in Cindy Bradshaw's paternal family.  Most cancers happen by chance and this negative test might suggest that her cancer falls into this category.  However, the information from the maternal family is limited, since Cindy Bradshaw's mother only had one brother and one half-brother.  Cindy Bradshaw maternal grandmother was also diagnosed with breast cancer in her early 52s.  Thus, it could also be the case that our current genetic testing is just not good enough to identify potential genetic causes at this point in time.  We encourage Cindy Bradshaw to call and update the family history with [place] if anyone in her family is diagnosed with cancers in the future.  In the meantime, we recommended she continue to follow  the cancer management and screening guidelines provided by her oncology and primary healthcare providers.   RECOMMENDATIONS FOR FAMILY MEMBERS: Women in this family might be at some increased risk of developing cancer, over the general population risk, simply due to the family history of cancer. We recommended women in this family have a  yearly mammogram beginning at age 41, or 67 years younger than the earliest onset of cancer, an an annual clinical breast exam, and perform monthly breast self-exams.  Thus, Cindy Bradshaw daughter and even her nieces could likely begin annual mammogram screening at the age of 66, based on her diagnosis at age 24.  Cindy Bradshaw sister should continue to have annual mammogram screening.  Women in this family should also have a gynecological exam as recommended by their primary provider. All family members should have a colonoscopy by age 23.  FOLLOW-UP: Lastly, we discussed with Cindy Bradshaw that cancer genetics is a rapidly advancing field and it is possible that new genetic tests will be appropriate for her and/or her family members in the future. We encouraged her to remain in contact with cancer genetics on an annual basis so we can update her personal and family histories and let her know of advances in cancer genetics that may benefit this family.   Our contact number was provided. Cindy Bradshaw questions were answered to her satisfaction, and she knows she is welcome to call us at anytime with additional questions or concerns.   Jeanine Luz, MS Genetic Counselor [email or web address] Phone: [phone]

## 2015-10-16 NOTE — Assessment & Plan Note (Signed)
Left breast biopsy 08/17/2015:1:30 position: Invasive ductal carcinoma, grade 3, ER 0%, PR 0%, HER-2 negative ratio 1.48, Ki-67 90%, 3.1 cm tumor, axilla negative, T2 N0 stage II a clinical stage  Treatment plan: 1. Neoadjuvant chemotherapy with dose dense Adriamycin and Cytoxan 4 followed by Taxol and carboplatin weekly 12 2. Followed by surgery 3. Followed by adjuvant radiation -------------------------------------------------------------------------------------------------------------------------------------------------- Current treatment: Cycle 3 day 1 dose dense Adriamycin and Cytoxan Echocardiogram 09/13/2015: EF 55-60% Labs were reviewed. Normocytic anemia: work up revealed iron deficiency: Plan to give IV Iron next week with chemo Chemotoxicities: 1. Severe nausea and vomiting: Patient was brought in last Friday to get IV fluids as well as Saturday. She is feeling a lot better today. I recommended reducing the dosage of chemotherapy. Nausea lasted 2-3 days after chemotherapy. 2. Hair loss 3. Conjunctivitis versus corneal abrasion: patient has redness of the eye with discomfort in the eye accompanied by light intolerance. : Patient is using over-the-counter eyedrops which appeared to be helping her pain. It is unclear if there is any active infection, I will start her on ciprofloxacin eyedrops to be used every 4 hours when awake.  Breast biopsies x 2: 09/28/2015 : PASH  Anemia: we will send for hemoglobin electrophoresis today. Her daughter and her mother are all anemic. Monitoring very closely for chemotherapy toxicities. Return to clinic in 2 weeks for cycle 4 

## 2015-10-17 ENCOUNTER — Other Ambulatory Visit: Payer: Federal, State, Local not specified - PPO

## 2015-10-17 ENCOUNTER — Other Ambulatory Visit (HOSPITAL_BASED_OUTPATIENT_CLINIC_OR_DEPARTMENT_OTHER): Payer: Federal, State, Local not specified - PPO

## 2015-10-17 ENCOUNTER — Telehealth: Payer: Self-pay | Admitting: Hematology and Oncology

## 2015-10-17 ENCOUNTER — Ambulatory Visit: Payer: Federal, State, Local not specified - PPO | Admitting: Nutrition

## 2015-10-17 ENCOUNTER — Encounter: Payer: Self-pay | Admitting: *Deleted

## 2015-10-17 ENCOUNTER — Encounter: Payer: Self-pay | Admitting: Hematology and Oncology

## 2015-10-17 ENCOUNTER — Ambulatory Visit (HOSPITAL_BASED_OUTPATIENT_CLINIC_OR_DEPARTMENT_OTHER): Payer: Federal, State, Local not specified - PPO

## 2015-10-17 ENCOUNTER — Ambulatory Visit (HOSPITAL_BASED_OUTPATIENT_CLINIC_OR_DEPARTMENT_OTHER): Payer: Federal, State, Local not specified - PPO | Admitting: Hematology and Oncology

## 2015-10-17 VITALS — BP 136/74 | HR 98 | Temp 97.9°F | Resp 18 | Ht 65.0 in | Wt 240.8 lb

## 2015-10-17 DIAGNOSIS — R112 Nausea with vomiting, unspecified: Secondary | ICD-10-CM

## 2015-10-17 DIAGNOSIS — T451X5A Adverse effect of antineoplastic and immunosuppressive drugs, initial encounter: Secondary | ICD-10-CM

## 2015-10-17 DIAGNOSIS — C50412 Malignant neoplasm of upper-outer quadrant of left female breast: Secondary | ICD-10-CM | POA: Diagnosis not present

## 2015-10-17 DIAGNOSIS — H1032 Unspecified acute conjunctivitis, left eye: Secondary | ICD-10-CM

## 2015-10-17 DIAGNOSIS — D649 Anemia, unspecified: Secondary | ICD-10-CM | POA: Diagnosis not present

## 2015-10-17 DIAGNOSIS — Z5111 Encounter for antineoplastic chemotherapy: Secondary | ICD-10-CM | POA: Diagnosis not present

## 2015-10-17 DIAGNOSIS — H578 Other specified disorders of eye and adnexa: Secondary | ICD-10-CM

## 2015-10-17 LAB — CBC WITH DIFFERENTIAL/PLATELET
BASO%: 0.4 % (ref 0.0–2.0)
BASOS ABS: 0 10*3/uL (ref 0.0–0.1)
EOS ABS: 0 10*3/uL (ref 0.0–0.5)
EOS%: 0.1 % (ref 0.0–7.0)
HEMATOCRIT: 31 % — AB (ref 34.8–46.6)
HEMOGLOBIN: 10 g/dL — AB (ref 11.6–15.9)
LYMPH#: 1.7 10*3/uL (ref 0.9–3.3)
LYMPH%: 15.6 % (ref 14.0–49.7)
MCH: 26.9 pg (ref 25.1–34.0)
MCHC: 32.2 g/dL (ref 31.5–36.0)
MCV: 83.6 fL (ref 79.5–101.0)
MONO#: 0.7 10*3/uL (ref 0.1–0.9)
MONO%: 6.1 % (ref 0.0–14.0)
NEUT%: 77.8 % — ABNORMAL HIGH (ref 38.4–76.8)
NEUTROS ABS: 8.3 10*3/uL — AB (ref 1.5–6.5)
Platelets: 184 10*3/uL (ref 145–400)
RBC: 3.71 10*6/uL (ref 3.70–5.45)
RDW: 17.7 % — AB (ref 11.2–14.5)
WBC: 10.7 10*3/uL — ABNORMAL HIGH (ref 3.9–10.3)

## 2015-10-17 LAB — COMPREHENSIVE METABOLIC PANEL
ALBUMIN: 3.3 g/dL — AB (ref 3.5–5.0)
ALK PHOS: 93 U/L (ref 40–150)
ALT: 34 U/L (ref 0–55)
AST: 17 U/L (ref 5–34)
Anion Gap: 6 mEq/L (ref 3–11)
BUN: 15.8 mg/dL (ref 7.0–26.0)
CALCIUM: 9.6 mg/dL (ref 8.4–10.4)
CO2: 25 mEq/L (ref 22–29)
CREATININE: 0.9 mg/dL (ref 0.6–1.1)
Chloride: 108 mEq/L (ref 98–109)
EGFR: >90 mL/min/{1.73_m2} (ref >90)
Glucose: 108 mg/dl (ref 70–140)
POTASSIUM: 4 meq/L (ref 3.5–5.1)
Sodium: 139 mEq/L (ref 136–145)
TOTAL PROTEIN: 6.7 g/dL (ref 6.4–8.3)

## 2015-10-17 MED ORDER — PALONOSETRON HCL INJECTION 0.25 MG/5ML
INTRAVENOUS | Status: AC
  Filled 2015-10-17: qty 5

## 2015-10-17 MED ORDER — PALONOSETRON HCL INJECTION 0.25 MG/5ML
0.2500 mg | Freq: Once | INTRAVENOUS | Status: AC
  Administered 2015-10-17: 0.25 mg via INTRAVENOUS

## 2015-10-17 MED ORDER — PEGFILGRASTIM 6 MG/0.6ML ~~LOC~~ PSKT
6.0000 mg | PREFILLED_SYRINGE | Freq: Once | SUBCUTANEOUS | Status: AC
  Administered 2015-10-17: 6 mg via SUBCUTANEOUS
  Filled 2015-10-17: qty 0.6

## 2015-10-17 MED ORDER — HEPARIN SOD (PORK) LOCK FLUSH 100 UNIT/ML IV SOLN
500.0000 [IU] | Freq: Once | INTRAVENOUS | Status: AC | PRN
  Administered 2015-10-17: 500 [IU]
  Filled 2015-10-17: qty 5

## 2015-10-17 MED ORDER — DOXORUBICIN HCL CHEMO IV INJECTION 2 MG/ML
54.0000 mg/m2 | Freq: Once | INTRAVENOUS | Status: AC
  Administered 2015-10-17: 120 mg via INTRAVENOUS
  Filled 2015-10-17: qty 60

## 2015-10-17 MED ORDER — SODIUM CHLORIDE 0.9 % IV SOLN
Freq: Once | INTRAVENOUS | Status: AC
  Administered 2015-10-17: 14:00:00 via INTRAVENOUS
  Filled 2015-10-17: qty 5

## 2015-10-17 MED ORDER — SODIUM CHLORIDE 0.9 % IV SOLN
510.0000 mg | Freq: Once | INTRAVENOUS | Status: AC
  Administered 2015-10-17: 510 mg via INTRAVENOUS
  Filled 2015-10-17: qty 17

## 2015-10-17 MED ORDER — SODIUM CHLORIDE 0.9 % IJ SOLN
10.0000 mL | INTRAMUSCULAR | Status: DC | PRN
  Administered 2015-10-17: 10 mL
  Filled 2015-10-17: qty 10

## 2015-10-17 MED ORDER — SODIUM CHLORIDE 0.9 % IV SOLN
550.0000 mg/m2 | Freq: Once | INTRAVENOUS | Status: AC
  Administered 2015-10-17: 1240 mg via INTRAVENOUS
  Filled 2015-10-17: qty 62

## 2015-10-17 MED ORDER — SODIUM CHLORIDE 0.9 % IV SOLN
Freq: Once | INTRAVENOUS | Status: AC
  Administered 2015-10-17: 13:00:00 via INTRAVENOUS

## 2015-10-17 NOTE — Telephone Encounter (Signed)
Appointments made and avs printed °

## 2015-10-17 NOTE — Progress Notes (Signed)
45 year old female diagnosed with breast cancer.  She is a patient of Dr. Lindi Adie.  Past medical history includes asthma, arthritis, and GERD.  Medications include Decadron, Ativan, Zofran, and Compazine.  Labs include albumin 3.5.  Height: 65 inches. Weight: 240.8 pounds February 13. Usual body weight: 235 pounds May 2016. BMI: 40.07.  I met with patient during infusion. Patient very tired, hard time staying awake during our conversation. She does report some nausea and constipation. Patient denies difficulty eating or other nutrition impact symptoms.  Nutrition diagnosis:  Food and nutrition related knowledge deficit related to breast cancer and associated treatments as evidenced by no prior need for nutrition related information.  Intervention:  Educated patient on strategies for improving nausea by taking nausea medications as prescribed and consuming bland foods in small frequent meals and snacks. Provided fact sheet on nausea and vomiting. Educated patient on strategies for improving constipation including increasing water intake and consuming more fiber.   Recommended patient speak to physician regarding constipation to determine if medication is required. Provided fact sheet on constipation. Teach back method was used.  Contact information was provided.  Monitoring, evaluation, goals:  Patient will tolerate improved nutrition impact symptoms.  Next visit: Patient agrees to contact me with questions or concerns.  **Disclaimer: This note was dictated with voice recognition software. Similar sounding words can inadvertently be transcribed and this note may contain transcription errors which may not have been corrected upon publication of note.**

## 2015-10-17 NOTE — Progress Notes (Signed)
Patient Care Team: [name], MD as PCP - General (Internal Medicine)  SUMMARY OF ONCOLOGIC HISTORY:   Breast cancer of upper-outer quadrant of left female breast ([place])   08/17/2015 Initial Diagnosis left breast biopsy 1:30 position: Invasive ductal carcinoma, grade 3, ER 0%, PR 0%, HER-2 negative ratio 1.48, Ki-67 90%, 3.1 cm tumor, axilla negative, T2 N0 stage II a clinical stage   09/19/2015 -  Neo-Adjuvant Chemotherapy Neoadjuvant chemotherapy with dose dense Adriamycin and Cytoxan 4 followed by Taxol and carboplatin weekly 12   09/28/2015 Procedure  Left breast biopsy anterior third left upper outer quadrant: PASH;  upper inner quadrant: PASH    CHIEF COMPLIANT: Cycle 3 Adriamycin and Cytoxan  INTERVAL HISTORY: Cindy Bradshaw is a 45 year old with above-mentioned history of breast cancer currently on cycle 3 of photosensitive masses Cytoxan. After cycle 2 she developed nausea and vomiting for 3 days. She required IV fluids which were given. The study. She comes back in reporting that the nausea has resolved. She had conjunctivitis which was related to inflammation/infection. She had seen ophthalmologist who put her on eyedrops and her symptoms improved. Eyedrops which we prescribed did not help her.  REVIEW OF SYSTEMS:   Constitutional: Denies fevers, chills or abnormal weight loss Eyes: Denies blurriness of vision Ears, nose, mouth, throat, and face: Denies mucositis or sore throat Respiratory: Denies cough, dyspnea or wheezes Cardiovascular: Denies palpitation, chest discomfort Gastrointestinal:  Denies nausea, heartburn or change in bowel habits Skin: Denies abnormal skin rashes Lymphatics: Denies new lymphadenopathy or easy bruising Neurological:Denies numbness, tingling or new weaknesses Behavioral/Psych: Mood is stable, no new changes  Extremities: No lower extremity edema Breast:  denies any pain or lumps or nodules in either breasts All other systems  were reviewed with the patient and are negative.  I have reviewed the past medical history, past surgical history, social history and family history with the patient and they are unchanged from previous note.  ALLERGIES:  is allergic to dilaudid; ivp dye; percocet; and toradol.  MEDICATIONS:  Current Outpatient Prescriptions  Medication Sig Dispense Refill  . ciprofloxacin (CILOXAN) 0.3 % ophthalmic solution Place 1 drop into the left eye as directed. every 2 hrs while awake for 2 days. Then 1 drop every 4 hours while awake for the next 5 days. 5 mL 0  . ciprofloxacin (CILOXAN) 0.3 % ophthalmic solution PLACE 1 DROP IN THE LEFT EYE EVERY 2 HRS WHILE AWAKE FOR 2 DAYS THEN 1 DROP EVERY 4 HRS X 5 DAYS  0  . dexamethasone (DECADRON) 4 MG tablet Take 1 tablet once daily on the day after chemo, then 1 tablet twice daily with food.  1  . diphenhydrAMINE (BENADRYL) 50 MG capsule TAKE 1 HOUR PRIOR TO RADIOLOGIC PROCEDURE  0  . HYDROcodone-acetaminophen (NORCO) 10-325 MG tablet Take by mouth every 4 (four) hours as needed. for pain  0  . lidocaine-prilocaine (EMLA) cream Apply to affected area as directed.  3  . LORazepam (ATIVAN) 0.5 MG tablet Take 0.5 mg by mouth at bedtime.  0  . naproxen (NAPROSYN) 500 MG tablet Take 1 tablet (500 mg total) by mouth 2 (two) times daily. 30 tablet 0  . ondansetron (ZOFRAN ODT) 4 MG disintegrating tablet Take 1 tablet (4 mg total) by mouth every 8 (eight) hours as needed for nausea or vomiting. 20 tablet 0  . ondansetron (ZOFRAN) 8 MG tablet Take 1 tablet by mouth twice a day as needed, starting the 3rd day after chemotherapy.  1  . oxyCODONE-acetaminophen (PERCOCET/ROXICET) 5-325 MG tablet Take 1-2 tablets by mouth every 4 (four) hours as needed for severe pain. 15 tablet 0  . prednisoLONE acetate (PRED FORTE) 1 % ophthalmic suspension PLACE 1 DROP INTO BOTH EYES 4 TIMES A DAY AND WEAN AS DIRECTED  1  . predniSONE (DELTASONE) 50 MG tablet TAKE 1 TABLET BY MOUTH 13 HRS,  7 HRS AND 1 HOUR PRIOR TO RADIOLOGIC PROCEDURE  0  . PROAIR HFA 108 (90 BASE) MCG/ACT inhaler Inhale 2 puffs into the lungs every 4 (four) hours as needed. For shortness of breath  3  . prochlorperazine (COMPAZINE) 10 MG tablet Take 1 tablet every 6 hours as needed for nausea or vomiting.  1   No current facility-administered medications for this visit.   Facility-Administered Medications Ordered in Other Visits  Medication Dose Route Frequency Provider Last Rate Last Dose  . 0.9 %  sodium chloride infusion   Intravenous Once Nicholas Lose, MD      . cyclophosphamide (CYTOXAN) 1,240 mg in sodium chloride 0.9 % 250 mL chemo infusion  550 mg/m2 (Treatment Plan Actual) Intravenous Once Nicholas Lose, MD      . DOXOrubicin (ADRIAMYCIN) chemo injection 120 mg  54 mg/m2 (Treatment Plan Actual) Intravenous Once Nicholas Lose, MD      . ferumoxytol (FERAHEME) 510 mg in sodium chloride 0.9 % 100 mL IVPB  510 mg Intravenous Once Nicholas Lose, MD      . fosaprepitant (EMEND) 150 mg, dexamethasone (DECADRON) 12 mg in sodium chloride 0.9 % 145 mL IVPB   Intravenous Once Nicholas Lose, MD      . heparin lock flush 100 unit/mL  500 Units Intracatheter Once PRN Nicholas Lose, MD      . palonosetron (ALOXI) injection 0.25 mg  0.25 mg Intravenous Once Nicholas Lose, MD      . pegfilgrastim (NEULASTA ONPRO KIT) injection 6 mg  6 mg Subcutaneous Once Nicholas Lose, MD      . sodium chloride 0.9 % injection 10 mL  10 mL Intracatheter PRN Nicholas Lose, MD        PHYSICAL EXAMINATION: ECOG PERFORMANCE STATUS: 1 - Symptomatic but completely ambulatory  Filed Vitals:   10/17/15 0956  BP: 136/74  Pulse: 98  Temp: 97.9 F (36.6 C)  Resp: 18   Filed Weights   10/17/15 0956  Weight: 240 lb 12.8 oz (109.226 kg)    GENERAL:alert, no distress and comfortable SKIN: skin color, texture, turgor are normal, no rashes or significant lesions EYES: normal, Conjunctiva are pink and non-injected, sclera clear OROPHARYNX:no  exudate, no erythema and lips, buccal mucosa, and tongue normal  NECK: supple, thyroid normal size, non-tender, without nodularity LYMPH:  no palpable lymphadenopathy in the cervical, axillary or inguinal LUNGS: clear to auscultation and percussion with normal breathing effort HEART: regular rate & rhythm and no murmurs and no lower extremity edema ABDOMEN:abdomen soft, non-tender and normal bowel sounds MUSCULOSKELETAL:no cyanosis of digits and no clubbing  NEURO: alert & oriented x 3 with fluent speech, no focal motor/sensory deficits EXTREMITIES: No lower extremity edema  LABORATORY DATA:  I have reviewed the data as listed   Chemistry      Component Value Date/Time   NA 139 10/17/2015 0942   NA 134* 09/16/2015 2316   K 4.0 10/17/2015 0942   K 3.4* 09/16/2015 2316   CL 102 09/16/2015 2316   CO2 25 10/17/2015 0942   CO2 24 09/16/2015 2316   BUN 15.8 10/17/2015 0942  BUN 12 09/16/2015 2316   CREATININE 0.9 10/17/2015 0942   CREATININE 0.64 09/16/2015 2316      Component Value Date/Time   CALCIUM 9.6 10/17/2015 0942   CALCIUM 9.6 09/16/2015 2316   ALKPHOS 93 10/17/2015 0942   ALKPHOS 61 09/16/2015 2316   AST 17 10/17/2015 0942   AST 22 09/16/2015 2316   ALT 34 10/17/2015 0942   ALT 33 09/16/2015 2316   BILITOT <0.30 10/17/2015 0942   BILITOT 0.5 09/16/2015 2316       Lab Results  Component Value Date   WBC 10.7* 10/17/2015   HGB 10.0* 10/17/2015   HCT 31.0* 10/17/2015   MCV 83.6 10/17/2015   PLT 184 10/17/2015   NEUTROABS 8.3* 10/17/2015   ASSESSMENT & PLAN:  Breast cancer of upper-outer quadrant of left female breast ([place]) Left breast biopsy 08/17/2015:1:30 position: Invasive ductal carcinoma, grade 3, ER 0%, PR 0%, HER-2 negative ratio 1.48, Ki-67 90%, 3.1 cm tumor, axilla negative, T2 N0 stage II a clinical stage  Treatment plan: 1. Neoadjuvant chemotherapy with dose dense Adriamycin and Cytoxan 4 followed by Taxol and carboplatin weekly 12 2. Followed  by surgery 3. Followed by adjuvant radiation -------------------------------------------------------------------------------------------------------------------------------------------------- Current treatment: Cycle 3 day 1 dose dense Adriamycin and Cytoxan Echocardiogram 09/13/2015: EF 55-60% Labs were reviewed. Normocytic anemia: work up revealed iron deficiency: Plan to give IV Iron next week with chemo Chemotoxicities: 1. Severe nausea and vomiting: Patient was brought in last Friday to get IV fluids as well as Saturday. She is feeling a lot better today. I recommended reducing the dosage of chemotherapy. Nausea lasted 2-3 days after chemotherapy. 2. Hair loss 3. Conjunctivitis versus corneal abrasion: patient has redness of the eye with discomfort in the eye accompanied by light intolerance. : Patient is using over-the-counter eyedrops which appeared to be helping her pain. It is unclear if there is any active infection, I will start her on ciprofloxacin eyedrops to be used every 4 hours when awake.  Breast biopsies x 2: 09/28/2015 : PASH  Anemia: we will send for hemoglobin electrophoresis today. Her daughter and her mother are all anemic. Monitoring very closely for chemotherapy toxicities. Return to clinic for IV fluids on Wednesday and in 1 week for toxicity check   No orders of the defined types were placed in this encounter.   The patient has a good understanding of the overall plan. she agrees with it. she will call with any problems that may develop before the next visit here.   Rulon Eisenmenger, MD 10/17/2015

## 2015-10-17 NOTE — Patient Instructions (Signed)
[facility] Discharge Instructions for Patients Receiving Chemotherapy  Today you received the following chemotherapy agents: Adriamycin and cytoxan.  To help prevent nausea and vomiting after your treatment, we encourage you to take your nausea medication: Compazine 10 mg every 6 hours as needed.   If you develop nausea and vomiting that is not controlled by your nausea medication, call the clinic.   BELOW ARE SYMPTOMS THAT SHOULD BE REPORTED IMMEDIATELY:  *FEVER GREATER THAN 100.5 F  *CHILLS WITH OR WITHOUT FEVER  NAUSEA AND VOMITING THAT IS NOT CONTROLLED WITH YOUR NAUSEA MEDICATION  *UNUSUAL SHORTNESS OF BREATH  *UNUSUAL BRUISING OR BLEEDING  TENDERNESS IN MOUTH AND THROAT WITH OR WITHOUT PRESENCE OF ULCERS  *URINARY PROBLEMS  *BOWEL PROBLEMS  UNUSUAL RASH Items with * indicate a potential emergency and should be followed up as soon as possible.  Feel free to call the clinic you have any questions or concerns. The clinic phone number is [phone].  Please show the [place] at check-in to the Emergency Department and triage nurse.  Ferumoxytol injection What is this medicine? FERUMOXYTOL is an iron complex. Iron is used to make healthy red blood cells, which carry oxygen and nutrients throughout the body. This medicine is used to treat iron deficiency anemia in people with chronic kidney disease. This medicine may be used for other purposes; ask your health care provider or pharmacist if you have questions. What should I tell my health care provider before I take this medicine? They need to know if you have any of these conditions: -anemia not caused by low iron levels -high levels of iron in the blood -magnetic resonance imaging (MRI) test scheduled -an unusual or allergic reaction to iron, other medicines, foods, dyes, or preservatives -pregnant or trying to get pregnant -breast-feeding How should I use this medicine? This medicine is for  injection into a vein. It is given by a health care professional in a hospital or clinic setting. Talk to your pediatrician regarding the use of this medicine in children. Special care may be needed. Overdosage: If you think you have taken too much of this medicine contact a poison control center or emergency room at once. NOTE: This medicine is only for you. Do not share this medicine with others. What if I miss a dose? It is important not to miss your dose. Call your doctor or health care professional if you are unable to keep an appointment. What may interact with this medicine? This medicine may interact with the following medications: -other iron products This list may not describe all possible interactions. Give your health care provider a list of all the medicines, herbs, non-prescription drugs, or dietary supplements you use. Also tell them if you smoke, drink alcohol, or use illegal drugs. Some items may interact with your medicine. What should I watch for while using this medicine? Visit your doctor or healthcare professional regularly. Tell your doctor or healthcare professional if your symptoms do not start to get better or if they get worse. You may need blood work done while you are taking this medicine. You may need to follow a special diet. Talk to your doctor. Foods that contain iron include: whole grains/cereals, dried fruits, beans, or peas, leafy green vegetables, and organ meats (liver, kidney). What side effects may I notice from receiving this medicine? Side effects that you should report to your doctor or health care professional as soon as possible: -allergic reactions like skin rash, itching or hives, swelling  of the face, lips, or tongue -breathing problems -changes in blood pressure -feeling faint or lightheaded, falls -fever or chills -flushing, sweating, or hot feelings -swelling of the ankles or feet Side effects that usually do not require medical attention  (Report these to your doctor or health care professional if they continue or are bothersome.): -diarrhea -headache -nausea, vomiting -stomach pain This list may not describe all possible side effects. Call your doctor for medical advice about side effects. You may report side effects to FDA at 1-800-FDA-1088. Where should I keep my medicine? This drug is given in a hospital or clinic and will not be stored at home. NOTE: This sheet is a summary. It may not cover all possible information. If you have questions about this medicine, talk to your doctor, pharmacist, or health care provider.    2016, Elsevier/Gold Standard. (2012-04-04 15:23:36) Ferumoxytol injection What is this medicine? FERUMOXYTOL is an iron complex. Iron is used to make healthy red blood cells, which carry oxygen and nutrients throughout the body. This medicine is used to treat iron deficiency anemia in people with chronic kidney disease. This medicine may be used for other purposes; ask your health care provider or pharmacist if you have questions. What should I tell my health care provider before I take this medicine? They need to know if you have any of these conditions: -anemia not caused by low iron levels -high levels of iron in the blood -magnetic resonance imaging (MRI) test scheduled -an unusual or allergic reaction to iron, other medicines, foods, dyes, or preservatives -pregnant or trying to get pregnant -breast-feeding How should I use this medicine? This medicine is for injection into a vein. It is given by a health care professional in a hospital or clinic setting. Talk to your pediatrician regarding the use of this medicine in children. Special care may be needed. Overdosage: If you think you have taken too much of this medicine contact a poison control center or emergency room at once. NOTE: This medicine is only for you. Do not share this medicine with others. What if I miss a dose? It is important not  to miss your dose. Call your doctor or health care professional if you are unable to keep an appointment. What may interact with this medicine? This medicine may interact with the following medications: -other iron products This list may not describe all possible interactions. Give your health care provider a list of all the medicines, herbs, non-prescription drugs, or dietary supplements you use. Also tell them if you smoke, drink alcohol, or use illegal drugs. Some items may interact with your medicine. What should I watch for while using this medicine? Visit your doctor or healthcare professional regularly. Tell your doctor or healthcare professional if your symptoms do not start to get better or if they get worse. You may need blood work done while you are taking this medicine. You may need to follow a special diet. Talk to your doctor. Foods that contain iron include: whole grains/cereals, dried fruits, beans, or peas, leafy green vegetables, and organ meats (liver, kidney). What side effects may I notice from receiving this medicine? Side effects that you should report to your doctor or health care professional as soon as possible: -allergic reactions like skin rash, itching or hives, swelling of the face, lips, or tongue -breathing problems -changes in blood pressure -feeling faint or lightheaded, falls -fever or chills -flushing, sweating, or hot feelings -swelling of the ankles or feet Side effects that usually do  not require medical attention (Report these to your doctor or health care professional if they continue or are bothersome.): -diarrhea -headache -nausea, vomiting -stomach pain This list may not describe all possible side effects. Call your doctor for medical advice about side effects. You may report side effects to FDA at 1-800-FDA-1088. Where should I keep my medicine? This drug is given in a hospital or clinic and will not be stored at home. NOTE: This sheet is a  summary. It may not cover all possible information. If you have questions about this medicine, talk to your doctor, pharmacist, or health care provider.    2016, Elsevier/Gold Standard. (2012-04-04 15:23:36)

## 2015-10-18 ENCOUNTER — Ambulatory Visit (HOSPITAL_BASED_OUTPATIENT_CLINIC_OR_DEPARTMENT_OTHER): Payer: Federal, State, Local not specified - PPO

## 2015-10-18 ENCOUNTER — Other Ambulatory Visit: Payer: Self-pay

## 2015-10-18 VITALS — BP 150/84 | HR 103 | Temp 97.6°F | Resp 18

## 2015-10-18 DIAGNOSIS — Z5189 Encounter for other specified aftercare: Secondary | ICD-10-CM

## 2015-10-18 DIAGNOSIS — C50412 Malignant neoplasm of upper-outer quadrant of left female breast: Secondary | ICD-10-CM

## 2015-10-18 MED ORDER — PEGFILGRASTIM INJECTION 6 MG/0.6ML ~~LOC~~
6.0000 mg | PREFILLED_SYRINGE | Freq: Once | SUBCUTANEOUS | Status: AC
  Administered 2015-10-18: 6 mg via SUBCUTANEOUS
  Filled 2015-10-18: qty 0.6

## 2015-10-18 MED ORDER — SODIUM CHLORIDE 0.9% FLUSH
10.0000 mL | INTRAVENOUS | Status: DC | PRN
  Administered 2015-10-18: 10 mL via INTRAVENOUS
  Filled 2015-10-18: qty 10

## 2015-10-18 MED ORDER — HEPARIN SOD (PORK) LOCK FLUSH 100 UNIT/ML IV SOLN
500.0000 [IU] | Freq: Once | INTRAVENOUS | Status: AC
  Administered 2015-10-18: 500 [IU] via INTRAVENOUS
  Filled 2015-10-18: qty 5

## 2015-10-18 MED ORDER — SODIUM CHLORIDE 0.9 % IV SOLN
1000.0000 mL | Freq: Once | INTRAVENOUS | Status: AC
  Administered 2015-10-18: 1000 mL via INTRAVENOUS

## 2015-10-18 NOTE — Patient Instructions (Signed)

## 2015-10-18 NOTE — Progress Notes (Signed)
Neulasta Onpro fell off this morning before injection of medication. Patient returned Onpro to pharmacy. Injection given as ordered.

## 2015-10-19 ENCOUNTER — Ambulatory Visit (HOSPITAL_COMMUNITY): Payer: Federal, State, Local not specified - PPO

## 2015-10-20 ENCOUNTER — Encounter (HOSPITAL_COMMUNITY): Payer: Self-pay | Admitting: General Surgery

## 2015-10-24 NOTE — Assessment & Plan Note (Signed)
Left breast biopsy 08/17/2015:1:30 position: Invasive ductal carcinoma, grade 3, ER 0%, PR 0%, HER-2 negative ratio 1.48, Ki-67 90%, 3.1 cm tumor, axilla negative, T2 N0 stage II a clinical stage  Treatment plan: 1. Neoadjuvant chemotherapy with dose dense Adriamycin and Cytoxan 4 followed by Taxol and carboplatin weekly 12 2. Followed by surgery 3. Followed by adjuvant radiation -------------------------------------------------------------------------------------------------------------------------------------------------- Current treatment: Cycle 3 day 8 dose dense Adriamycin and Cytoxan Echocardiogram 09/13/2015: EF 55-60% Labs were reviewed. Normocytic anemia: work up revealed iron deficiency: Plan to give IV Iron next week with chemo Chemotoxicities: 1. Severe nausea and vomiting: Patient was brought in last Friday to get IV fluids as well as Saturday. She is feeling a lot better today. I recommended reducing the dosage of chemotherapy. Nausea lasted 2-3 days after chemotherapy. 2. Hair loss 3. Conjunctivitis versus corneal abrasion: patient has redness of the eye with discomfort in the eye accompanied by light intolerance. : Patient is using over-the-counter eyedrops which appeared to be helping her pain. It is unclear if there is any active infection, I will start her on ciprofloxacin eyedrops to be used every 4 hours when awake.  Breast biopsies x 2: 09/28/2015 : PASH  Anemia: we will send for hemoglobin electrophoresis. Her daughter and her mother are all anemic. Monitoring very closely for chemotherapy toxicities. Return to clinic in 1 week for cycle 2

## 2015-10-25 ENCOUNTER — Telehealth: Payer: Self-pay | Admitting: Hematology and Oncology

## 2015-10-25 ENCOUNTER — Ambulatory Visit (HOSPITAL_BASED_OUTPATIENT_CLINIC_OR_DEPARTMENT_OTHER): Payer: Federal, State, Local not specified - PPO | Admitting: Hematology and Oncology

## 2015-10-25 ENCOUNTER — Encounter: Payer: Self-pay | Admitting: Hematology and Oncology

## 2015-10-25 VITALS — BP 133/71 | HR 103 | Temp 98.0°F | Resp 19 | Wt 247.0 lb

## 2015-10-25 DIAGNOSIS — C50412 Malignant neoplasm of upper-outer quadrant of left female breast: Secondary | ICD-10-CM | POA: Diagnosis not present

## 2015-10-25 DIAGNOSIS — D649 Anemia, unspecified: Secondary | ICD-10-CM

## 2015-10-25 NOTE — Telephone Encounter (Signed)
appt made and avs printed °

## 2015-10-25 NOTE — Progress Notes (Signed)
Patient Care Team: [name], MD as PCP - General (Internal Medicine)  SUMMARY OF ONCOLOGIC HISTORY:   Breast cancer of upper-outer quadrant of left female breast ([place])   08/17/2015 Initial Diagnosis left breast biopsy 1:30 position: Invasive ductal carcinoma, grade 3, ER 0%, PR 0%, HER-2 negative ratio 1.48, Ki-67 90%, 3.1 cm tumor, axilla negative, T2 N0 stage II a clinical stage   09/19/2015 -  Neo-Adjuvant Chemotherapy Neoadjuvant chemotherapy with dose dense Adriamycin and Cytoxan 4 followed by Taxol and carboplatin weekly 12   09/28/2015 Procedure  Left breast biopsy anterior third left upper outer quadrant: PASH;  upper inner quadrant: PASH    CHIEF COMPLIANT: Cycle 3 day 9 toxicity check  INTERVAL HISTORY: Cindy Bradshaw is a 45 year old with a low mention history of left breast cancer currently neoadjuvant chemotherapy. She received cycle 3 of treatment and continued to have chemotherapy related toxicities especially nausea, fatigue, generalized aches and pains, lower extremity swelling. She received IV fluids day after chemotherapy and it appears to have helped her. The Neulasta on body injector fell off and she had to come and receive a Neulasta injection.  REVIEW OF SYSTEMS:   Constitutional: Denies fevers, chills or abnormal weight loss Eyes: Denies blurriness of vision Ears, nose, mouth, throat, and face: Denies mucositis or sore throat Respiratory: Denies cough, dyspnea or wheezes Cardiovascular: Denies palpitation, chest discomfort Gastrointestinal:  Nausea Skin: Denies abnormal skin rashes Lymphatics: Denies new lymphadenopathy or easy bruising Neurological:Denies numbness, tingling or new weaknesses, generalized fatigue Behavioral/Psych: Mood is stable, no new changes  Extremities: No lower extremity edema  All other systems were reviewed with the patient and are negative.  I have reviewed the past medical history, past surgical history, social  history and family history with the patient and they are unchanged from previous note.  ALLERGIES:  is allergic to dilaudid; ivp dye; percocet; and toradol.  MEDICATIONS:  Current Outpatient Prescriptions  Medication Sig Dispense Refill  . ciprofloxacin (CILOXAN) 0.3 % ophthalmic solution Place 1 drop into the left eye as directed. every 2 hrs while awake for 2 days. Then 1 drop every 4 hours while awake for the next 5 days. 5 mL 0  . ciprofloxacin (CILOXAN) 0.3 % ophthalmic solution PLACE 1 DROP IN THE LEFT EYE EVERY 2 HRS WHILE AWAKE FOR 2 DAYS THEN 1 DROP EVERY 4 HRS X 5 DAYS  0  . dexamethasone (DECADRON) 4 MG tablet Take 1 tablet once daily on the day after chemo, then 1 tablet twice daily with food.  1  . diphenhydrAMINE (BENADRYL) 50 MG capsule TAKE 1 HOUR PRIOR TO RADIOLOGIC PROCEDURE  0  . HYDROcodone-acetaminophen (NORCO) 10-325 MG tablet Take by mouth every 4 (four) hours as needed. for pain  0  . lidocaine-prilocaine (EMLA) cream Apply to affected area as directed.  3  . LORazepam (ATIVAN) 0.5 MG tablet Take 0.5 mg by mouth at bedtime.  0  . naproxen (NAPROSYN) 500 MG tablet Take 1 tablet (500 mg total) by mouth 2 (two) times daily. 30 tablet 0  . ondansetron (ZOFRAN ODT) 4 MG disintegrating tablet Take 1 tablet (4 mg total) by mouth every 8 (eight) hours as needed for nausea or vomiting. 20 tablet 0  . ondansetron (ZOFRAN) 8 MG tablet Take 1 tablet by mouth twice a day as needed, starting the 3rd day after chemotherapy.  1  . oxyCODONE-acetaminophen (PERCOCET/ROXICET) 5-325 MG tablet Take 1-2 tablets by mouth every 4 (four) hours as needed for severe pain.  15 tablet 0  . prednisoLONE acetate (PRED FORTE) 1 % ophthalmic suspension PLACE 1 DROP INTO BOTH EYES 4 TIMES A DAY AND WEAN AS DIRECTED  1  . predniSONE (DELTASONE) 50 MG tablet TAKE 1 TABLET BY MOUTH 13 HRS, 7 HRS AND 1 HOUR PRIOR TO RADIOLOGIC PROCEDURE  0  . PROAIR HFA 108 (90 BASE) MCG/ACT inhaler Inhale 2 puffs into the  lungs every 4 (four) hours as needed. For shortness of breath  3  . prochlorperazine (COMPAZINE) 10 MG tablet Take 1 tablet every 6 hours as needed for nausea or vomiting.  1   No current facility-administered medications for this visit.    PHYSICAL EXAMINATION: ECOG PERFORMANCE STATUS: 1 - Symptomatic but completely ambulatory  Filed Vitals:   10/25/15 1518  BP: 133/71  Pulse: 103  Temp: 98 F (36.7 C)  Resp: 19   Filed Weights   10/25/15 1518  Weight: 247 lb (112.038 kg)    GENERAL:alert, no distress and comfortable SKIN: skin color, texture, turgor are normal, no rashes or significant lesions EYES: normal, Conjunctiva are pink and non-injected, sclera clear OROPHARYNX:no exudate, no erythema and lips, buccal mucosa, and tongue normal  NECK: supple, thyroid normal size, non-tender, without nodularity LYMPH:  no palpable lymphadenopathy in the cervical, axillary or inguinal LUNGS: clear to auscultation and percussion with normal breathing effort HEART: regular rate & rhythm and no murmurs and no lower extremity edema ABDOMEN:abdomen soft, non-tender and normal bowel sounds MUSCULOSKELETAL:no cyanosis of digits and no clubbing  NEURO: alert & oriented x 3 with fluent speech, no focal motor/sensory deficits EXTREMITIES: No lower extremity edema  LABORATORY DATA:  I have reviewed the data as listed   Chemistry      Component Value Date/Time   NA 139 10/17/2015 0942   NA 134* 09/16/2015 2316   K 4.0 10/17/2015 0942   K 3.4* 09/16/2015 2316   CL 102 09/16/2015 2316   CO2 25 10/17/2015 0942   CO2 24 09/16/2015 2316   BUN 15.8 10/17/2015 0942   BUN 12 09/16/2015 2316   CREATININE 0.9 10/17/2015 0942   CREATININE 0.64 09/16/2015 2316      Component Value Date/Time   CALCIUM 9.6 10/17/2015 0942   CALCIUM 9.6 09/16/2015 2316   ALKPHOS 93 10/17/2015 0942   ALKPHOS 61 09/16/2015 2316   AST 17 10/17/2015 0942   AST 22 09/16/2015 2316   ALT 34 10/17/2015 0942   ALT 33  09/16/2015 2316   BILITOT <0.30 10/17/2015 0942   BILITOT 0.5 09/16/2015 2316       Lab Results  Component Value Date   WBC 10.7* 10/17/2015   HGB 10.0* 10/17/2015   HCT 31.0* 10/17/2015   MCV 83.6 10/17/2015   PLT 184 10/17/2015   NEUTROABS 8.3* 10/17/2015     ASSESSMENT & PLAN:  Breast cancer of upper-outer quadrant of left female breast ([facility]) Left breast biopsy 08/17/2015:1:30 position: Invasive ductal carcinoma, grade 3, ER 0%, PR 0%, HER-2 negative ratio 1.48, Ki-67 90%, 3.1 cm tumor, axilla negative, T2 N0 stage II a clinical stage  Treatment plan: 1. Neoadjuvant chemotherapy with dose dense Adriamycin and Cytoxan 4 followed by Taxol and carboplatin weekly 12 2. Followed by surgery 3. Followed by adjuvant radiation -------------------------------------------------------------------------------------------------------------------------------------------------- Current treatment: Cycle 3 day 8 dose dense Adriamycin and Cytoxan Echocardiogram 09/13/2015: EF 55-60% Labs were reviewed. Normocytic anemia: work up revealed iron deficiency: Plan to give IV Iron next week with chemo Chemotoxicities: 1. Severe nausea and vomiting: Patient was brought  in last Friday to get IV fluids as well as Saturday. She is feeling a lot better today. I recommended reducing the dosage of chemotherapy. Nausea lasted 2-3 days after chemotherapy. 2. Hair loss 3. Conjunctivitis versus corneal abrasion: patient has redness of the eye with discomfort in the eye accompanied by light intolerance. : Patient is using over-the-counter eyedrops which appeared to be helping her pain. It is unclear if there is any active infection, I will start her on ciprofloxacin eyedrops to be used every 4 hours when awake.  Breast biopsies x 2: 09/28/2015 : PASH  Anemia: Awaiting results of hemoglobin electrophoresis. Her daughter and her mother are all anemic. Monitoring very closely for chemotherapy  toxicities. Return to clinic in 1 week for cycle 2   No orders of the defined types were placed in this encounter.   The patient has a good understanding of the overall plan. she agrees with it. she will call with any problems that may develop before the next visit here.   Rulon Eisenmenger, MD 10/25/2015

## 2015-10-27 NOTE — Progress Notes (Signed)
Unable to get in to exam room prior to MD.  No assessment performed.  

## 2015-10-31 ENCOUNTER — Other Ambulatory Visit (HOSPITAL_BASED_OUTPATIENT_CLINIC_OR_DEPARTMENT_OTHER): Payer: Federal, State, Local not specified - PPO

## 2015-10-31 ENCOUNTER — Ambulatory Visit (HOSPITAL_BASED_OUTPATIENT_CLINIC_OR_DEPARTMENT_OTHER): Payer: Federal, State, Local not specified - PPO

## 2015-10-31 VITALS — BP 121/84 | HR 100 | Temp 99.0°F | Resp 18

## 2015-10-31 DIAGNOSIS — Z5112 Encounter for antineoplastic immunotherapy: Secondary | ICD-10-CM

## 2015-10-31 DIAGNOSIS — C50412 Malignant neoplasm of upper-outer quadrant of left female breast: Secondary | ICD-10-CM

## 2015-10-31 DIAGNOSIS — D649 Anemia, unspecified: Secondary | ICD-10-CM

## 2015-10-31 LAB — COMPREHENSIVE METABOLIC PANEL
ALBUMIN: 3.3 g/dL — AB (ref 3.5–5.0)
ALK PHOS: 88 U/L (ref 40–150)
ALT: 30 U/L (ref 0–55)
ANION GAP: 8 meq/L (ref 3–11)
AST: 16 U/L (ref 5–34)
BUN: 9.3 mg/dL (ref 7.0–26.0)
CALCIUM: 9.7 mg/dL (ref 8.4–10.4)
CHLORIDE: 109 meq/L (ref 98–109)
CO2: 22 mEq/L (ref 22–29)
CREATININE: 0.8 mg/dL (ref 0.6–1.1)
EGFR: >90 mL/min/{1.73_m2} (ref >90)
Glucose: 121 mg/dl (ref 70–140)
Potassium: 3.7 mEq/L (ref 3.5–5.1)
Sodium: 139 mEq/L (ref 136–145)
Total Protein: 6.7 g/dL (ref 6.4–8.3)

## 2015-10-31 LAB — CBC WITH DIFFERENTIAL/PLATELET
BASO%: 0.4 % (ref 0.0–2.0)
Basophils Absolute: 0 10*3/uL (ref 0.0–0.1)
EOS ABS: 0 10*3/uL (ref 0.0–0.5)
EOS%: 0.4 % (ref 0.0–7.0)
HEMATOCRIT: 33 % — AB (ref 34.8–46.6)
HEMOGLOBIN: 10.4 g/dL — AB (ref 11.6–15.9)
LYMPH#: 1.4 10*3/uL (ref 0.9–3.3)
LYMPH%: 19.9 % (ref 14.0–49.7)
MCH: 27.2 pg (ref 25.1–34.0)
MCHC: 31.5 g/dL (ref 31.5–36.0)
MCV: 86.2 fL (ref 79.5–101.0)
MONO#: 0.7 10*3/uL (ref 0.1–0.9)
MONO%: 9.4 % (ref 0.0–14.0)
NEUT%: 69.9 % (ref 38.4–76.8)
NEUTROS ABS: 4.8 10*3/uL (ref 1.5–6.5)
PLATELETS: 274 10*3/uL (ref 145–400)
RBC: 3.83 10*6/uL (ref 3.70–5.45)
RDW: 20.9 % — ABNORMAL HIGH (ref 11.2–14.5)
WBC: 6.9 10*3/uL (ref 3.9–10.3)
nRBC: 2 % — ABNORMAL HIGH (ref 0–0)

## 2015-10-31 MED ORDER — SODIUM CHLORIDE 0.9 % IV SOLN
500.0000 mg/m2 | Freq: Once | INTRAVENOUS | Status: AC
  Administered 2015-10-31: 1120 mg via INTRAVENOUS
  Filled 2015-10-31: qty 56

## 2015-10-31 MED ORDER — PALONOSETRON HCL INJECTION 0.25 MG/5ML
0.2500 mg | Freq: Once | INTRAVENOUS | Status: AC
  Administered 2015-10-31: 0.25 mg via INTRAVENOUS

## 2015-10-31 MED ORDER — PEGFILGRASTIM 6 MG/0.6ML ~~LOC~~ PSKT
6.0000 mg | PREFILLED_SYRINGE | Freq: Once | SUBCUTANEOUS | Status: AC
  Administered 2015-10-31: 6 mg via SUBCUTANEOUS
  Filled 2015-10-31: qty 0.6

## 2015-10-31 MED ORDER — HEPARIN SOD (PORK) LOCK FLUSH 100 UNIT/ML IV SOLN
500.0000 [IU] | Freq: Once | INTRAVENOUS | Status: AC | PRN
  Administered 2015-10-31: 500 [IU]
  Filled 2015-10-31: qty 5

## 2015-10-31 MED ORDER — PALONOSETRON HCL INJECTION 0.25 MG/5ML
INTRAVENOUS | Status: AC
  Filled 2015-10-31: qty 5

## 2015-10-31 MED ORDER — DOXORUBICIN HCL CHEMO IV INJECTION 2 MG/ML
50.0000 mg/m2 | Freq: Once | INTRAVENOUS | Status: AC
  Administered 2015-10-31: 112 mg via INTRAVENOUS
  Filled 2015-10-31: qty 56

## 2015-10-31 MED ORDER — SODIUM CHLORIDE 0.9 % IV SOLN
Freq: Once | INTRAVENOUS | Status: AC
  Administered 2015-10-31: 13:00:00 via INTRAVENOUS

## 2015-10-31 MED ORDER — SODIUM CHLORIDE 0.9 % IV SOLN
Freq: Once | INTRAVENOUS | Status: AC
  Administered 2015-10-31: 13:00:00 via INTRAVENOUS
  Filled 2015-10-31: qty 5

## 2015-10-31 MED ORDER — SODIUM CHLORIDE 0.9 % IJ SOLN
10.0000 mL | INTRAMUSCULAR | Status: DC | PRN
  Administered 2015-10-31: 10 mL
  Filled 2015-10-31: qty 10

## 2015-10-31 NOTE — Patient Instructions (Signed)
[facility] Discharge Instructions for Patients Receiving Chemotherapy  Today you received the following chemotherapy agents: Adriamycin and cytoxan.  To help prevent nausea and vomiting after your treatment, we encourage you to take your nausea medication: Compazine 10 mg every 6 hours as needed.   If you develop nausea and vomiting that is not controlled by your nausea medication, call the clinic.   BELOW ARE SYMPTOMS THAT SHOULD BE REPORTED IMMEDIATELY:  *FEVER GREATER THAN 100.5 F  *CHILLS WITH OR WITHOUT FEVER  NAUSEA AND VOMITING THAT IS NOT CONTROLLED WITH YOUR NAUSEA MEDICATION  *UNUSUAL SHORTNESS OF BREATH  *UNUSUAL BRUISING OR BLEEDING  TENDERNESS IN MOUTH AND THROAT WITH OR WITHOUT PRESENCE OF ULCERS  *URINARY PROBLEMS  *BOWEL PROBLEMS  UNUSUAL RASH Items with * indicate a potential emergency and should be followed up as soon as possible.  Feel free to call the clinic you have any questions or concerns. The clinic phone number is [phone].  Please show the [facility] at check-in to the Emergency Department and triage nurse.  Ferumoxytol injection What is this medicine? FERUMOXYTOL is an iron complex. Iron is used to make healthy red blood cells, which carry oxygen and nutrients throughout the body. This medicine is used to treat iron deficiency anemia in people with chronic kidney disease. This medicine may be used for other purposes; ask your health care provider or pharmacist if you have questions. What should I tell my health care provider before I take this medicine? They need to know if you have any of these conditions: -anemia not caused by low iron levels -high levels of iron in the blood -magnetic resonance imaging (MRI) test scheduled -an unusual or allergic reaction to iron, other medicines, foods, dyes, or preservatives -pregnant or trying to get pregnant -breast-feeding How should I use this medicine? This medicine is for  injection into a vein. It is given by a health care professional in a hospital or clinic setting. Talk to your pediatrician regarding the use of this medicine in children. Special care may be needed. Overdosage: If you think you have taken too much of this medicine contact a poison control center or emergency room at once. NOTE: This medicine is only for you. Do not share this medicine with others. What if I miss a dose? It is important not to miss your dose. Call your doctor or health care professional if you are unable to keep an appointment. What may interact with this medicine? This medicine may interact with the following medications: -other iron products This list may not describe all possible interactions. Give your health care provider a list of all the medicines, herbs, non-prescription drugs, or dietary supplements you use. Also tell them if you smoke, drink alcohol, or use illegal drugs. Some items may interact with your medicine. What should I watch for while using this medicine? Visit your doctor or healthcare professional regularly. Tell your doctor or healthcare professional if your symptoms do not start to get better or if they get worse. You may need blood work done while you are taking this medicine. You may need to follow a special diet. Talk to your doctor. Foods that contain iron include: whole grains/cereals, dried fruits, beans, or peas, leafy green vegetables, and organ meats (liver, kidney). What side effects may I notice from receiving this medicine? Side effects that you should report to your doctor or health care professional as soon as possible: -allergic reactions like skin rash, itching or hives, swelling  of the face, lips, or tongue -breathing problems -changes in blood pressure -feeling faint or lightheaded, falls -fever or chills -flushing, sweating, or hot feelings -swelling of the ankles or feet Side effects that usually do not require medical attention  (Report these to your doctor or health care professional if they continue or are bothersome.): -diarrhea -headache -nausea, vomiting -stomach pain This list may not describe all possible side effects. Call your doctor for medical advice about side effects. You may report side effects to FDA at 1-800-FDA-1088. Where should I keep my medicine? This drug is given in a hospital or clinic and will not be stored at home. NOTE: This sheet is a summary. It may not cover all possible information. If you have questions about this medicine, talk to your doctor, pharmacist, or health care provider.    2016, Elsevier/Gold Standard. (2012-04-04 15:23:36) Ferumoxytol injection What is this medicine? FERUMOXYTOL is an iron complex. Iron is used to make healthy red blood cells, which carry oxygen and nutrients throughout the body. This medicine is used to treat iron deficiency anemia in people with chronic kidney disease. This medicine may be used for other purposes; ask your health care provider or pharmacist if you have questions. What should I tell my health care provider before I take this medicine? They need to know if you have any of these conditions: -anemia not caused by low iron levels -high levels of iron in the blood -magnetic resonance imaging (MRI) test scheduled -an unusual or allergic reaction to iron, other medicines, foods, dyes, or preservatives -pregnant or trying to get pregnant -breast-feeding How should I use this medicine? This medicine is for injection into a vein. It is given by a health care professional in a hospital or clinic setting. Talk to your pediatrician regarding the use of this medicine in children. Special care may be needed. Overdosage: If you think you have taken too much of this medicine contact a poison control center or emergency room at once. NOTE: This medicine is only for you. Do not share this medicine with others. What if I miss a dose? It is important not  to miss your dose. Call your doctor or health care professional if you are unable to keep an appointment. What may interact with this medicine? This medicine may interact with the following medications: -other iron products This list may not describe all possible interactions. Give your health care provider a list of all the medicines, herbs, non-prescription drugs, or dietary supplements you use. Also tell them if you smoke, drink alcohol, or use illegal drugs. Some items may interact with your medicine. What should I watch for while using this medicine? Visit your doctor or healthcare professional regularly. Tell your doctor or healthcare professional if your symptoms do not start to get better or if they get worse. You may need blood work done while you are taking this medicine. You may need to follow a special diet. Talk to your doctor. Foods that contain iron include: whole grains/cereals, dried fruits, beans, or peas, leafy green vegetables, and organ meats (liver, kidney). What side effects may I notice from receiving this medicine? Side effects that you should report to your doctor or health care professional as soon as possible: -allergic reactions like skin rash, itching or hives, swelling of the face, lips, or tongue -breathing problems -changes in blood pressure -feeling faint or lightheaded, falls -fever or chills -flushing, sweating, or hot feelings -swelling of the ankles or feet Side effects that usually do  not require medical attention (Report these to your doctor or health care professional if they continue or are bothersome.): -diarrhea -headache -nausea, vomiting -stomach pain This list may not describe all possible side effects. Call your doctor for medical advice about side effects. You may report side effects to FDA at 1-800-FDA-1088. Where should I keep my medicine? This drug is given in a hospital or clinic and will not be stored at home. NOTE: This sheet is a  summary. It may not cover all possible information. If you have questions about this medicine, talk to your doctor, pharmacist, or health care provider.    2016, Elsevier/Gold Standard. (2012-04-04 15:23:36)

## 2015-11-01 ENCOUNTER — Other Ambulatory Visit: Payer: Self-pay | Admitting: *Deleted

## 2015-11-01 ENCOUNTER — Other Ambulatory Visit: Payer: Self-pay | Admitting: Hematology and Oncology

## 2015-11-01 LAB — HEMOGLOBINOPATHY EVALUATION
HEMOGLOBIN F QUANTITATION: 0 % (ref 0.0–2.0)
HGB A: 98.1 % — AB (ref 94.0–98.0)
HGB C: 0 %
HGB S: 0 %
Hemoglobin A2 Quantitation: 1.9 % (ref 0.7–3.1)

## 2015-11-02 ENCOUNTER — Inpatient Hospital Stay (HOSPITAL_COMMUNITY): Admission: RE | Admit: 2015-11-02 | Payer: Self-pay | Source: Ambulatory Visit

## 2015-11-03 ENCOUNTER — Telehealth: Payer: Self-pay | Admitting: Hematology and Oncology

## 2015-11-03 ENCOUNTER — Telehealth: Payer: Self-pay | Admitting: *Deleted

## 2015-11-03 ENCOUNTER — Other Ambulatory Visit (HOSPITAL_BASED_OUTPATIENT_CLINIC_OR_DEPARTMENT_OTHER): Payer: Federal, State, Local not specified - PPO

## 2015-11-03 ENCOUNTER — Encounter: Payer: Self-pay | Admitting: Hematology and Oncology

## 2015-11-03 ENCOUNTER — Other Ambulatory Visit: Payer: Self-pay | Admitting: *Deleted

## 2015-11-03 ENCOUNTER — Other Ambulatory Visit: Payer: Self-pay | Admitting: Hematology and Oncology

## 2015-11-03 ENCOUNTER — Ambulatory Visit (HOSPITAL_BASED_OUTPATIENT_CLINIC_OR_DEPARTMENT_OTHER): Payer: Federal, State, Local not specified - PPO | Admitting: Hematology and Oncology

## 2015-11-03 ENCOUNTER — Ambulatory Visit: Payer: Federal, State, Local not specified - PPO

## 2015-11-03 ENCOUNTER — Other Ambulatory Visit: Payer: Self-pay

## 2015-11-03 VITALS — BP 127/67 | HR 93 | Temp 98.5°F | Resp 21 | Ht 65.0 in | Wt 246.5 lb

## 2015-11-03 VITALS — BP 134/69 | HR 102 | Temp 99.0°F

## 2015-11-03 DIAGNOSIS — C50412 Malignant neoplasm of upper-outer quadrant of left female breast: Secondary | ICD-10-CM

## 2015-11-03 DIAGNOSIS — D509 Iron deficiency anemia, unspecified: Secondary | ICD-10-CM | POA: Diagnosis not present

## 2015-11-03 DIAGNOSIS — R509 Fever, unspecified: Secondary | ICD-10-CM | POA: Diagnosis not present

## 2015-11-03 DIAGNOSIS — G4700 Insomnia, unspecified: Secondary | ICD-10-CM

## 2015-11-03 DIAGNOSIS — D72829 Elevated white blood cell count, unspecified: Secondary | ICD-10-CM | POA: Diagnosis not present

## 2015-11-03 DIAGNOSIS — T451X5A Adverse effect of antineoplastic and immunosuppressive drugs, initial encounter: Secondary | ICD-10-CM

## 2015-11-03 DIAGNOSIS — N3000 Acute cystitis without hematuria: Secondary | ICD-10-CM

## 2015-11-03 DIAGNOSIS — R110 Nausea: Secondary | ICD-10-CM

## 2015-11-03 DIAGNOSIS — R112 Nausea with vomiting, unspecified: Secondary | ICD-10-CM

## 2015-11-03 DIAGNOSIS — E860 Dehydration: Secondary | ICD-10-CM

## 2015-11-03 LAB — CBC WITH DIFFERENTIAL/PLATELET
BASO%: 0.2 % (ref 0.0–2.0)
BASOS ABS: 0.1 10*3/uL (ref 0.0–0.1)
EOS ABS: 0 10*3/uL (ref 0.0–0.5)
EOS%: 0 % (ref 0.0–7.0)
HCT: 32.1 % — ABNORMAL LOW (ref 34.8–46.6)
HGB: 10.3 g/dL — ABNORMAL LOW (ref 11.6–15.9)
LYMPH%: 2.3 % — AB (ref 14.0–49.7)
MCH: 27.6 pg (ref 25.1–34.0)
MCHC: 32.1 g/dL (ref 31.5–36.0)
MCV: 86.1 fL (ref 79.5–101.0)
MONO#: 0.7 10*3/uL (ref 0.1–0.9)
MONO%: 1.1 % (ref 0.0–14.0)
NEUT%: 96.4 % — AB (ref 38.4–76.8)
NEUTROS ABS: 54.6 10*3/uL — AB (ref 1.5–6.5)
PLATELETS: 327 10*3/uL (ref 145–400)
RBC: 3.73 10*6/uL (ref 3.70–5.45)
RDW: 21.6 % — ABNORMAL HIGH (ref 11.2–14.5)
WBC: 56.7 10*3/uL (ref 3.9–10.3)
lymph#: 1.3 10*3/uL (ref 0.9–3.3)

## 2015-11-03 LAB — URINALYSIS, MICROSCOPIC - CHCC
Bilirubin (Urine): NEGATIVE
Glucose: NEGATIVE mg/dL
Ketones: NEGATIVE mg/dL
NITRITE: POSITIVE
PH: 6.5 (ref 4.6–8.0)
Specific Gravity, Urine: 1.015 (ref 1.003–1.035)
UROBILINOGEN UR: 0.2 mg/dL (ref 0.2–1)

## 2015-11-03 LAB — COMPREHENSIVE METABOLIC PANEL
ALT: 22 U/L (ref 0–55)
ANION GAP: 7 meq/L (ref 3–11)
AST: 10 U/L (ref 5–34)
Albumin: 3.2 g/dL — ABNORMAL LOW (ref 3.5–5.0)
Alkaline Phosphatase: 102 U/L (ref 40–150)
BUN: 19.6 mg/dL (ref 7.0–26.0)
CO2: 23 meq/L (ref 22–29)
Calcium: 9.9 mg/dL (ref 8.4–10.4)
Chloride: 108 mEq/L (ref 98–109)
Creatinine: 0.8 mg/dL (ref 0.6–1.1)
Glucose: 139 mg/dl (ref 70–140)
Potassium: 3.7 mEq/L (ref 3.5–5.1)
Sodium: 138 mEq/L (ref 136–145)
TOTAL PROTEIN: 6.5 g/dL (ref 6.4–8.3)

## 2015-11-03 MED ORDER — SODIUM CHLORIDE 0.9% FLUSH
10.0000 mL | INTRAVENOUS | Status: DC | PRN
  Administered 2015-11-03: 10 mL via INTRAVENOUS
  Filled 2015-11-03: qty 10

## 2015-11-03 MED ORDER — HEPARIN SOD (PORK) LOCK FLUSH 100 UNIT/ML IV SOLN
500.0000 [IU] | Freq: Once | INTRAVENOUS | Status: AC
  Administered 2015-11-03: 500 [IU] via INTRAVENOUS
  Filled 2015-11-03: qty 5

## 2015-11-03 MED ORDER — LEVOFLOXACIN 500 MG PO TABS: 500.0000 mg | ORAL_TABLET | Freq: Every day | ORAL | Status: DC

## 2015-11-03 MED ORDER — SODIUM CHLORIDE 0.9 % IV SOLN: INTRAVENOUS | Status: DC

## 2015-11-03 MED ORDER — SODIUM CHLORIDE 0.9 % IV SOLN
Freq: Once | INTRAVENOUS | Status: DC
  Filled 2015-11-03: qty 4

## 2015-11-03 NOTE — Telephone Encounter (Signed)
appt made per 2/28 pof. Pt will be in office today will get updated copy of schedule then

## 2015-11-03 NOTE — Progress Notes (Signed)
Patient Care Team: [name], MD as PCP - General (Internal Medicine)  SUMMARY OF ONCOLOGIC HISTORY:   Breast cancer of upper-outer quadrant of left female breast ([place])   08/17/2015 Initial Diagnosis left breast biopsy 1:30 position: Invasive ductal carcinoma, grade 3, ER 0%, PR 0%, HER-2 negative ratio 1.48, Ki-67 90%, 3.1 cm tumor, axilla negative, T2 N0 stage II a clinical stage   09/19/2015 -  Neo-Adjuvant Chemotherapy Neoadjuvant chemotherapy with dose dense Adriamycin and Cytoxan 4 followed by Taxol and carboplatin weekly 12   09/28/2015 Procedure  Left breast biopsy anterior third left upper outer quadrant: PASH;  upper inner quadrant: PASH    CHIEF COMPLIANT: Fever 100, nausea, abdominal discomfort, fatigue, difficulty with sleeping  INTERVAL HISTORY: Cindy Bradshaw is a 45 year old with above-mentioned history of left breast cancer currently on neoadjuvant chemotherapy. She has completed 4 cycles of dose dense demise and Cytoxan. She comes in today complaining of feeling shaky and chills started yesterday. She had a temperature of 100 at home. She took Tylenol and temperature went away. This morning she also took another Tylenol to 99 temperature. She also has nausea and dehydration issues. She has not been eating or drinking too much. She cannot sleep through the night and therefore she feels very groggy throughout the day. She takes Ativan at bedtime but tells me that it doesn't work.  REVIEW OF SYSTEMS:   Constitutional: Complains of temperature maximum 100. Eyes: Denies blurriness of vision Ears, nose, mouth, throat, and face: Denies mucositis or sore throat Respiratory: Denies cough, dyspnea or wheezes Cardiovascular: Denies palpitation, chest discomfort Gastrointestinal:  Nausea Skin: Denies abnormal skin rashes Lymphatics: Denies new lymphadenopathy or easy bruising Neurological:Denies numbness, tingling or new weaknesses Behavioral/Psych: Mood is  stable, no new changes  Extremities: No lower extremity edema All other systems were reviewed with the patient and are negative.  I have reviewed the past medical history, past surgical history, social history and family history with the patient and they are unchanged from previous note.  ALLERGIES:  is allergic to dilaudid; ivp dye; percocet; and toradol.  MEDICATIONS:  Current Outpatient Prescriptions  Medication Sig Dispense Refill  . dexamethasone (DECADRON) 4 MG tablet Take 1 tablet once daily on the day after chemo, then 1 tablet twice daily with food.  1  . lidocaine-prilocaine (EMLA) cream Apply to affected area as directed.  3  . LORazepam (ATIVAN) 0.5 MG tablet Take 0.5 mg by mouth at bedtime.  0  . ondansetron (ZOFRAN ODT) 4 MG disintegrating tablet Take 1 tablet (4 mg total) by mouth every 8 (eight) hours as needed for nausea or vomiting. 20 tablet 0  . ondansetron (ZOFRAN) 8 MG tablet Take 1 tablet by mouth twice a day as needed, starting the 3rd day after chemotherapy.  1  . prednisoLONE acetate (PRED FORTE) 1 % ophthalmic suspension PLACE 1 DROP INTO BOTH EYES 4 TIMES A DAY AND WEAN AS DIRECTED  1  . PROAIR HFA 108 (90 BASE) MCG/ACT inhaler Inhale 2 puffs into the lungs every 4 (four) hours as needed. For shortness of breath  3  . prochlorperazine (COMPAZINE) 10 MG tablet Take 1 tablet every 6 hours as needed for nausea or vomiting.  1   No current facility-administered medications for this visit.    PHYSICAL EXAMINATION: ECOG PERFORMANCE STATUS: 2 - Symptomatic, <50% confined to bed  Filed Vitals:   11/03/15 1256  BP: 127/67  Pulse: 93  Temp: 98.5 F (36.9 C)  Resp: 21  Filed Weights   11/03/15 1256  Weight: 246 lb 8 oz (111.812 kg)    GENERAL:alert, no distress and comfortable SKIN: skin color, texture, turgor are normal, no rashes or significant lesions EYES: normal, Conjunctiva are pink and non-injected, sclera clear OROPHARYNX:no exudate, no erythema and  lips, buccal mucosa, and tongue normal  NECK: supple, thyroid normal size, non-tender, without nodularity LYMPH:  no palpable lymphadenopathy in the cervical, axillary or inguinal LUNGS: clear to auscultation and percussion with normal breathing effort HEART: regular rate & rhythm and no murmurs and no lower extremity edema ABDOMEN:abdomen soft, non-tender and normal bowel sounds MUSCULOSKELETAL:no cyanosis of digits and no clubbing  NEURO: alert & oriented x 3 with fluent speech, no focal motor/sensory deficits EXTREMITIES: No lower extremity edema LABORATORY DATA:  I have reviewed the data as listed   Chemistry      Component Value Date/Time   NA 138 11/03/2015 1239   NA 134* 09/16/2015 2316   K 3.7 11/03/2015 1239   K 3.4* 09/16/2015 2316   CL 102 09/16/2015 2316   CO2 23 11/03/2015 1239   CO2 24 09/16/2015 2316   BUN 19.6 11/03/2015 1239   BUN 12 09/16/2015 2316   CREATININE 0.8 11/03/2015 1239   CREATININE 0.64 09/16/2015 2316      Component Value Date/Time   CALCIUM 9.9 11/03/2015 1239   CALCIUM 9.6 09/16/2015 2316   ALKPHOS 102 11/03/2015 1239   ALKPHOS 61 09/16/2015 2316   AST 10 11/03/2015 1239   AST 22 09/16/2015 2316   ALT 22 11/03/2015 1239   ALT 33 09/16/2015 2316   BILITOT <0.30 11/03/2015 1239   BILITOT 0.5 09/16/2015 2316       Lab Results  Component Value Date   WBC 56.7* 11/03/2015   HGB 10.3* 11/03/2015   HCT 32.1* 11/03/2015   MCV 86.1 11/03/2015   PLT 327 11/03/2015   NEUTROABS 54.6* 11/03/2015   ASSESSMENT & PLAN:  Breast cancer of upper-outer quadrant of left female breast ([place]) Left breast biopsy 08/17/2015:1:30 position: Invasive ductal carcinoma, grade 3, ER 0%, PR 0%, HER-2 negative ratio 1.48, Ki-67 90%, 3.1 cm tumor, axilla negative, T2 N0 stage II a clinical stage  Treatment plan: 1. Neoadjuvant chemotherapy with dose dense Adriamycin and Cytoxan 4 followed by Taxol and carboplatin weekly 12 2. Followed by surgery 3. Followed by  adjuvant radiation Breast biopsies x 2: 09/28/2015 : PASH -------------------------------------------------------------------------------------------------------------------------------------------------- Current treatment: Completed Cycle 4 dose dense Adriamycin and Cytoxan on 10/31/15 Echocardiogram 09/13/2015: EF 55-60% Labs were reviewed. Normocytic anemia: work up revealed iron deficiency: Plan to give IV Iron next week with chemo Chemotoxicities: 1. Severe nausea and vomiting: We will administer IV Zofran today again because of nausea issues. 2. Hair loss 3. Conjunctivitis versus corneal abrasion: patient has redness of the eye with discomfort in the eye accompanied by light intolerance. 4. Insomnia: I encouraged her to take 2 Ativan once at bedtime. 5. Severe leukocytosis: Due to Neulasta. WBC count 56.7 and ANC of 54.6 However we will obtain blood cultures urine analysis to evaluate for any signs of infection. Today's exam does not reveal any lung abnormalities. 6. Fever up to 100F: It could be related to the very high white count but it could also be related to infection. We would like to obtain a urinalysis and blood cultures. Clinically she does not have any upper respiratory infection. I do not plan on treating her with antibiotic. 7. Dehydration: Will give her IV fluids with normal saline along with  Zofran.  Anemia:Hemoglobin electrophoresis . Her daughter and her mother are all anemic. Her hemoglobin is 10.3 mg Monitoring very closely for chemotherapy toxicities. Return to clinic as previously scheduled with cycle 1 of Taxol and carboplatin. I will decrease the dosing of Taxol to 50 mg/m.    No orders of the defined types were placed in this encounter.   The patient has a good understanding of the overall plan. she agrees with it. she will call with any problems that may develop before the next visit here.   Rulon Eisenmenger, MD 11/03/2015

## 2015-11-03 NOTE — Assessment & Plan Note (Signed)
Left breast biopsy 08/17/2015:1:30 position: Invasive ductal carcinoma, grade 3, ER 0%, PR 0%, HER-2 negative ratio 1.48, Ki-67 90%, 3.1 cm tumor, axilla negative, T2 N0 stage II a clinical stage  Treatment plan: 1. Neoadjuvant chemotherapy with dose dense Adriamycin and Cytoxan 4 followed by Taxol and carboplatin weekly 12 2. Followed by surgery 3. Followed by adjuvant radiation Breast biopsies x 2: 09/28/2015 : PASH -------------------------------------------------------------------------------------------------------------------------------------------------- Current treatment: Completed Cycle 4 dose dense Adriamycin and Cytoxan on 10/31/15 Echocardiogram 09/13/2015: EF 55-60% Labs were reviewed. Normocytic anemia: work up revealed iron deficiency: Plan to give IV Iron next week with chemo Chemotoxicities: 1. Severe nausea and vomiting: We will administer IV Zofran today again because of nausea issues. 2. Hair loss 3. Conjunctivitis versus corneal abrasion: patient has redness of the eye with discomfort in the eye accompanied by light intolerance. 4. Insomnia: I encouraged her to take 2 Ativan once at bedtime. 5. Severe leukocytosis: Due to Neulasta. WBC count 56.7 and ANC of 54.6 However we will obtain blood cultures urine analysis to evaluate for any signs of infection. Today's exam does not reveal any lung abnormalities. 6. Fever up to 100F: It could be related to the very high white count but it could also be related to infection. We would like to obtain a urinalysis and blood cultures. Clinically she does not have any upper respiratory infection. I do not plan on treating her with antibiotic. 7. Dehydration: Will give her IV fluids with normal saline along with Zofran.  Anemia:Hemoglobin electrophoresis . Her daughter and her mother are all anemic. Her hemoglobin is 10.3 mg Monitoring very closely for chemotherapy toxicities. Return to clinic as previously scheduled with cycle 1 of  Taxol and carboplatin. I will decrease the dosing of Taxol to 50 mg/m.

## 2015-11-03 NOTE — Progress Notes (Signed)
Unable to get in to exam room prior to MD.  No assessment performed.  

## 2015-11-03 NOTE — Patient Instructions (Signed)

## 2015-11-03 NOTE — Telephone Encounter (Signed)
Pt called with c/o fever of 99 and nausea and "not feeling well". Scheduled pt for lab and to see Dr. Lindi Adie today at 12:30. Confirmed appt times with pt. Pt denies vomiting and is only able to drink "a little bit".

## 2015-11-05 LAB — URINE CULTURE

## 2015-11-07 ENCOUNTER — Other Ambulatory Visit: Payer: Self-pay

## 2015-11-08 ENCOUNTER — Telehealth: Payer: Self-pay | Admitting: Hematology and Oncology

## 2015-11-08 NOTE — Telephone Encounter (Signed)
spoke to patient to confirm 3/13 appt change to 3/14 per 3/6 pof

## 2015-11-09 LAB — CULTURE, BLOOD (SINGLE)

## 2015-11-11 ENCOUNTER — Telehealth: Payer: Self-pay | Admitting: *Deleted

## 2015-11-11 NOTE — Telephone Encounter (Addendum)
  Oncology Nurse Navigator Documentation  Navigator Location: [facility] (11/11/15 1513) Navigator Encounter Type: Telephone (11/11/15 1513)  Patient called and said that her grandson had been seen at the emergency room last night with nausea, vomiting, diarrhea and fever.  He was told that he had the flu.  She wanted to know if she should take an antibiotic since she has been with her grandson.  She reports that she has just completed a week of Levaquin for UTI. Her last cycle of Cytoxan and Adriamycin was 10/31/15.  She has had some diarrhea but no nausea or vomiting.  I asked her to check her temp which was 98.3.  I discussed with Dr. Geralyn Flash nurse and then told her to drink plenty fluids and try immodium for diarrhea.  I also instructed her to monitor her temp and call for fever or if she develops other symptoms or go to the emergency room if [facility] is closed.  We also discussed avoiding contact with her grandson while he is sick.  Patient verbalized understanding.  Patient scheduled to see Dr. Lindi Adie 11/15/15.           Treatment Phase: Treatment (11/11/15 1513) Barriers/Navigation Needs: Coordination of Care (11/11/15 1513)   Interventions: Education Method (11/11/15 1513)            Acuity: Level 1 (11/11/15 1513)         Time Spent with Patient: 15 (11/11/15 1513)

## 2015-11-14 ENCOUNTER — Other Ambulatory Visit: Payer: Self-pay | Admitting: *Deleted

## 2015-11-14 ENCOUNTER — Ambulatory Visit: Payer: Self-pay

## 2015-11-14 ENCOUNTER — Other Ambulatory Visit: Payer: Self-pay

## 2015-11-14 ENCOUNTER — Ambulatory Visit: Payer: Self-pay | Admitting: Hematology and Oncology

## 2015-11-14 DIAGNOSIS — C50412 Malignant neoplasm of upper-outer quadrant of left female breast: Secondary | ICD-10-CM

## 2015-11-15 ENCOUNTER — Other Ambulatory Visit: Payer: Self-pay | Admitting: *Deleted

## 2015-11-15 ENCOUNTER — Other Ambulatory Visit (HOSPITAL_BASED_OUTPATIENT_CLINIC_OR_DEPARTMENT_OTHER): Payer: Federal, State, Local not specified - PPO

## 2015-11-15 ENCOUNTER — Ambulatory Visit (HOSPITAL_BASED_OUTPATIENT_CLINIC_OR_DEPARTMENT_OTHER): Payer: Federal, State, Local not specified - PPO

## 2015-11-15 ENCOUNTER — Telehealth: Payer: Self-pay | Admitting: Hematology and Oncology

## 2015-11-15 ENCOUNTER — Ambulatory Visit (HOSPITAL_BASED_OUTPATIENT_CLINIC_OR_DEPARTMENT_OTHER): Payer: Federal, State, Local not specified - PPO | Admitting: Hematology and Oncology

## 2015-11-15 ENCOUNTER — Encounter: Payer: Self-pay | Admitting: Hematology and Oncology

## 2015-11-15 VITALS — BP 130/70 | HR 105 | Temp 98.8°F | Resp 18

## 2015-11-15 VITALS — BP 133/79 | HR 107 | Temp 99.1°F | Resp 19 | Ht 65.0 in | Wt 245.9 lb

## 2015-11-15 DIAGNOSIS — Z5111 Encounter for antineoplastic chemotherapy: Secondary | ICD-10-CM | POA: Diagnosis not present

## 2015-11-15 DIAGNOSIS — C50412 Malignant neoplasm of upper-outer quadrant of left female breast: Secondary | ICD-10-CM

## 2015-11-15 DIAGNOSIS — R197 Diarrhea, unspecified: Secondary | ICD-10-CM | POA: Diagnosis not present

## 2015-11-15 DIAGNOSIS — D649 Anemia, unspecified: Secondary | ICD-10-CM

## 2015-11-15 DIAGNOSIS — R5383 Other fatigue: Secondary | ICD-10-CM | POA: Diagnosis not present

## 2015-11-15 LAB — CBC WITH DIFFERENTIAL/PLATELET
BASO%: 1 % (ref 0.0–2.0)
Basophils Absolute: 0.1 10*3/uL (ref 0.0–0.1)
EOS ABS: 0.1 10*3/uL (ref 0.0–0.5)
EOS%: 0.9 % (ref 0.0–7.0)
HEMATOCRIT: 34.8 % (ref 34.8–46.6)
HGB: 11 g/dL — ABNORMAL LOW (ref 11.6–15.9)
LYMPH#: 0.9 10*3/uL (ref 0.9–3.3)
LYMPH%: 13.5 % — ABNORMAL LOW (ref 14.0–49.7)
MCH: 27.4 pg (ref 25.1–34.0)
MCHC: 31.8 g/dL (ref 31.5–36.0)
MCV: 86.2 fL (ref 79.5–101.0)
MONO#: 0.6 10*3/uL (ref 0.1–0.9)
MONO%: 9.4 % (ref 0.0–14.0)
NEUT#: 5.2 10*3/uL (ref 1.5–6.5)
NEUT%: 75.2 % (ref 38.4–76.8)
PLATELETS: 253 10*3/uL (ref 145–400)
RBC: 4.03 10*6/uL (ref 3.70–5.45)
RDW: 23.6 % — ABNORMAL HIGH (ref 11.2–14.5)
WBC: 6.9 10*3/uL (ref 3.9–10.3)

## 2015-11-15 LAB — COMPREHENSIVE METABOLIC PANEL
ALT: 29 U/L (ref 0–55)
AST: 17 U/L (ref 5–34)
Albumin: 3.5 g/dL (ref 3.5–5.0)
Alkaline Phosphatase: 73 U/L (ref 40–150)
Anion Gap: 7 mEq/L (ref 3–11)
BUN: 11 mg/dL (ref 7.0–26.0)
CHLORIDE: 110 meq/L — AB (ref 98–109)
CO2: 23 meq/L (ref 22–29)
Calcium: 10.1 mg/dL (ref 8.4–10.4)
Creatinine: 0.8 mg/dL (ref 0.6–1.1)
EGFR: >90 mL/min/{1.73_m2} (ref >90)
GLUCOSE: 107 mg/dL (ref 70–140)
POTASSIUM: 3.8 meq/L (ref 3.5–5.1)
SODIUM: 140 meq/L (ref 136–145)
Total Bilirubin: <0.3 mg/dL (ref 0.20–1.20)
Total Protein: 6.8 g/dL (ref 6.4–8.3)

## 2015-11-15 MED ORDER — SODIUM CHLORIDE 0.9 % IJ SOLN
10.0000 mL | INTRAMUSCULAR | Status: DC | PRN
  Administered 2015-11-15: 10 mL
  Filled 2015-11-15: qty 10

## 2015-11-15 MED ORDER — DIPHENHYDRAMINE HCL 50 MG/ML IJ SOLN
25.0000 mg | Freq: Once | INTRAMUSCULAR | Status: AC
  Administered 2015-11-15: 25 mg via INTRAVENOUS

## 2015-11-15 MED ORDER — SODIUM CHLORIDE 0.9 % IV SOLN
300.0000 mg | Freq: Once | INTRAVENOUS | Status: AC
  Administered 2015-11-15: 300 mg via INTRAVENOUS
  Filled 2015-11-15: qty 30

## 2015-11-15 MED ORDER — PACLITAXEL CHEMO INJECTION 300 MG/50ML
50.0000 mg/m2 | Freq: Once | INTRAVENOUS | Status: AC
  Administered 2015-11-15: 114 mg via INTRAVENOUS
  Filled 2015-11-15: qty 19

## 2015-11-15 MED ORDER — FAMOTIDINE IN NACL 20-0.9 MG/50ML-% IV SOLN
20.0000 mg | Freq: Once | INTRAVENOUS | Status: AC
  Administered 2015-11-15: 20 mg via INTRAVENOUS

## 2015-11-15 MED ORDER — SODIUM CHLORIDE 0.9 % IV SOLN
Freq: Once | INTRAVENOUS | Status: AC
  Administered 2015-11-15: 12:00:00 via INTRAVENOUS
  Filled 2015-11-15: qty 8

## 2015-11-15 MED ORDER — HEPARIN SOD (PORK) LOCK FLUSH 100 UNIT/ML IV SOLN
500.0000 [IU] | Freq: Once | INTRAVENOUS | Status: AC | PRN
  Administered 2015-11-15: 500 [IU]
  Filled 2015-11-15: qty 5

## 2015-11-15 MED ORDER — SODIUM CHLORIDE 0.9 % IV SOLN
Freq: Once | INTRAVENOUS | Status: AC
  Administered 2015-11-15: 11:00:00 via INTRAVENOUS

## 2015-11-15 MED ORDER — FAMOTIDINE IN NACL 20-0.9 MG/50ML-% IV SOLN
INTRAVENOUS | Status: AC
  Filled 2015-11-15: qty 50

## 2015-11-15 MED ORDER — ONDANSETRON HCL 8 MG PO TABS: 8.0000 mg | ORAL_TABLET | Freq: Three times a day (TID) | ORAL | Status: DC | PRN

## 2015-11-15 MED ORDER — DIPHENHYDRAMINE HCL 50 MG/ML IJ SOLN
INTRAMUSCULAR | Status: AC
  Filled 2015-11-15: qty 1

## 2015-11-15 NOTE — Progress Notes (Signed)
Patient Care Team: [name], MD as PCP - General (Internal Medicine)  SUMMARY OF ONCOLOGIC HISTORY:   Breast cancer of upper-outer quadrant of left female breast ([place])   08/17/2015 Initial Diagnosis left breast biopsy 1:30 position: Invasive ductal carcinoma, grade 3, ER 0%, PR 0%, HER-2 negative ratio 1.48, Ki-67 90%, 3.1 cm tumor, axilla negative, T2 N0 stage II a clinical stage   09/19/2015 -  Neo-Adjuvant Chemotherapy Neoadjuvant chemotherapy with dose dense Adriamycin and Cytoxan 4 followed by Taxol and carboplatin weekly 12   09/28/2015 Procedure  Left breast biopsy anterior third left upper outer quadrant: PASH;  upper inner quadrant: PASH    CHIEF COMPLIANT: cycle 1 Taxol and carboplatin  INTERVAL HISTORY: Cindy Bradshaw is a 45 year old with above-mentioned history left breast triple negative cancer currently on neoadjuvant chemotherapy and today's cycle 1 of Taxol and carboplatin. She complains of diarrhea which is being managed with Imodium. She still feels very weak. She had fevers and was put on Levaquin and she completed the antibiotic course. She had suspected urinary tract infection. She reports that she feels tired and weak. Denies any nausea or vomiting.  REVIEW OF SYSTEMS:   Constitutional: Denies fevers, chills or abnormal weight loss Eyes: Denies blurriness of vision Ears, nose, mouth, throat, and face: Denies mucositis or sore throat Respiratory: Denies cough, dyspnea or wheezes Cardiovascular: Denies palpitation, chest discomfort Gastrointestinal:  Intermittent diarrhea Skin: skin dryness and itching Lymphatics: Denies new lymphadenopathy or easy bruising Neurological:Denies numbness, tingling or new weaknesses Behavioral/Psych: Mood is stable, no new changes  Extremities: No lower extremity edema  All other systems were reviewed with the patient and are negative.  I have reviewed the past medical history, past surgical history, social  history and family history with the patient and they are unchanged from previous note.  ALLERGIES:  is allergic to dilaudid; ivp dye; percocet; and toradol.  MEDICATIONS:  Current Outpatient Prescriptions  Medication Sig Dispense Refill  . lidocaine-prilocaine (EMLA) cream Apply to affected area as directed.  3  . LORazepam (ATIVAN) 0.5 MG tablet Take 0.5 mg by mouth at bedtime.  0  . ondansetron (ZOFRAN) 8 MG tablet Take 1 tablet by mouth twice a day as needed, starting the 3rd day after chemotherapy.  1  . PROAIR HFA 108 (90 BASE) MCG/ACT inhaler Inhale 2 puffs into the lungs every 4 (four) hours as needed. For shortness of breath  3  . prochlorperazine (COMPAZINE) 10 MG tablet Take 1 tablet every 6 hours as needed for nausea or vomiting.  1   No current facility-administered medications for this visit.    PHYSICAL EXAMINATION: ECOG PERFORMANCE STATUS: 2 - Symptomatic, <50% confined to bed  Filed Vitals:   11/15/15 0925  BP: 133/79  Pulse: 107  Temp: 99.1 F (37.3 C)  Resp: 19   Filed Weights   11/15/15 0925  Weight: 245 lb 14.4 oz (111.54 kg)    GENERAL:alert, no distress and comfortable SKIN: skin color, texture, turgor are normal, no rashes or significant lesions EYES: normal, Conjunctiva are pink and non-injected, sclera clear OROPHARYNX:no exudate, no erythema and lips, buccal mucosa, and tongue normal  NECK: supple, thyroid normal size, non-tender, without nodularity LYMPH:  no palpable lymphadenopathy in the cervical, axillary or inguinal LUNGS: clear to auscultation and percussion with normal breathing effort HEART: regular rate & rhythm and no murmurs and no lower extremity edema ABDOMEN:abdomen soft, non-tender and normal bowel sounds MUSCULOSKELETAL:no cyanosis of digits and no clubbing  NEURO: alert &  oriented x 3 with fluent speech, no focal motor/sensory deficits EXTREMITIES: No lower extremity edema  LABORATORY DATA:  I have reviewed the data as listed    Chemistry      Component Value Date/Time   NA 140 11/15/2015 0907   NA 134* 09/16/2015 2316   K 3.8 11/15/2015 0907   K 3.4* 09/16/2015 2316   CL 102 09/16/2015 2316   CO2 23 11/15/2015 0907   CO2 24 09/16/2015 2316   BUN 11.0 11/15/2015 0907   BUN 12 09/16/2015 2316   CREATININE 0.8 11/15/2015 0907   CREATININE 0.64 09/16/2015 2316      Component Value Date/Time   CALCIUM 10.1 11/15/2015 0907   CALCIUM 9.6 09/16/2015 2316   ALKPHOS 73 11/15/2015 0907   ALKPHOS 61 09/16/2015 2316   AST 17 11/15/2015 0907   AST 22 09/16/2015 2316   ALT 29 11/15/2015 0907   ALT 33 09/16/2015 2316   BILITOT <0.30 11/15/2015 0907   BILITOT 0.5 09/16/2015 2316       Lab Results  Component Value Date   WBC 6.9 11/15/2015   HGB 11.0* 11/15/2015   HCT 34.8 11/15/2015   MCV 86.2 11/15/2015   PLT 253 11/15/2015   NEUTROABS 5.2 11/15/2015   ASSESSMENT & PLAN:  Breast cancer of upper-outer quadrant of left female breast ([place]) Left breast biopsy 08/17/2015:1:30 position: Invasive ductal carcinoma, grade 3, ER 0%, PR 0%, HER-2 negative ratio 1.48, Ki-67 90%, 3.1 cm tumor, axilla negative, T2 N0 stage II a clinical stage  Treatment plan: 1. Neoadjuvant chemotherapy with dose dense Adriamycin and Cytoxan 4 followed by Taxol and carboplatin weekly 12 2. Followed by surgery 3. Followed by adjuvant radiation Breast biopsies x 2: 09/28/2015 : PASH -------------------------------------------------------------------------------------------------------------------------------------------------- Current treatment: Completed Cycle 4 dose dense Adriamycin and Cytoxan on 10/31/15;  Today's cycle 1 Taxol and carboplatin Echocardiogram 09/13/2015: EF 55-60% Labs were reviewed.  Normocytic anemia: work up revealed iron deficiency:  Received IV iron treatment with chemotherapy Chemotoxicities: 1. Severe nausea and vomiting: We will administer IV Zofran today again because of nausea issues. 2. Hair  loss 3. Conjunctivitis versus corneal abrasion: Resolved any great relief of collar and swelling in the urine electrolyte 90s with 2 refills( o'clock a.m. He is up he is same to use yes or things yes she is here okay okay and did notwith steroid eyedrops 4. Insomnia: I encouraged her to take 2 Ativan once at bedtime. 5. Fever up to 100F: It could be related to the very high white count but it could also be related to infection.  7. Dehydration:  received IV fluids with normal saline along with Zofran. 8. Diarrhea: Improved with Imodium 9. Skin dryness leading to itching: I instructed her to use Vaseline on Aquaphor  Anemia:Hemoglobin electrophoresis 10/31/2015: Normal . Her daughter and her mother are all anemic. Her hemoglobin is 11 mg  Monitoring very closely for chemotherapy toxicities. Return to clinic as previously scheduled with cycle 3 of Taxol and carboplatin (lower dosage). I decreased the dosing of Taxol to 50 mg/m.  Return to clinic in 1 week for toxicity check and follow-up   No orders of the defined types were placed in this encounter.   The patient has a good understanding of the overall plan. she agrees with it. she will call with any problems that may develop before the next visit here.   Rulon Eisenmenger, MD 11/15/2015

## 2015-11-15 NOTE — Assessment & Plan Note (Signed)
Left breast biopsy 08/17/2015:1:30 position: Invasive ductal carcinoma, grade 3, ER 0%, PR 0%, HER-2 negative ratio 1.48, Ki-67 90%, 3.1 cm tumor, axilla negative, T2 N0 stage II a clinical stage  Treatment plan: 1. Neoadjuvant chemotherapy with dose dense Adriamycin and Cytoxan 4 followed by Taxol and carboplatin weekly 12 2. Followed by surgery 3. Followed by adjuvant radiation Breast biopsies x 2: 09/28/2015 : PASH -------------------------------------------------------------------------------------------------------------------------------------------------- Current treatment: Completed Cycle 4 dose dense Adriamycin and Cytoxan on 10/31/15;  Today's cycle 1 Taxol and carboplatin Echocardiogram 09/13/2015: EF 55-60% Labs were reviewed.  Normocytic anemia: work up revealed iron deficiency:  Received IV iron treatment with chemotherapy Chemotoxicities: 1. Severe nausea and vomiting: We will administer IV Zofran today again because of nausea issues. 2. Hair loss 3. Conjunctivitis versus corneal abrasion: patient has redness of the eye with discomfort in the eye accompanied by light intolerance. 4. Insomnia: I encouraged her to take 2 Ativan once at bedtime. 5. Fever up to 100F: It could be related to the very high white count but it could also be related to infection.  7. Dehydration:  received IV fluids with normal saline along with Zofran.  Anemia:Hemoglobin electrophoresis . Her daughter and her mother are all anemic. Her hemoglobin is 10.3 mg  Monitoring very closely for chemotherapy toxicities. Return to clinic as previously scheduled with cycle 3 of Taxol and carboplatin (lower dosage). I will decrease the dosing of Taxol to 50 mg/m.   return to clinic in 1 week for toxicity check and follow-up

## 2015-11-15 NOTE — Progress Notes (Signed)
Pt first time taxol/carboplatin. Tolerated well wo any issues. Pt did complain of some minor nausea towards the end of taxol infusion. Gave pt crackers and gingerale, and she said it helped. Gave pt some education regarding expectations after chemo. Called Dr. Geralyn Flash nurse, Diana,RN to request a refill for zofran per pt request. Pt will pick her nausea medicine up at [facility] after treatment today. AVS and schedule printed for pt. Pt verbalized understanding on when to call if there is a concern that needs addressed after she goes home today. Placed an in basket message for follow up call tomorrow.

## 2015-11-15 NOTE — Telephone Encounter (Signed)
appt made and avs will be printed in treatment room

## 2015-11-15 NOTE — Patient Instructions (Signed)
[facility] Discharge Instructions for Patients Receiving Chemotherapy  Today you received the following chemotherapy agents Taxol/ Carboplatin  To help prevent nausea and vomiting after your treatment, we encourage you to take your nausea medication as directed by your doctor.    If you develop nausea and vomiting that is not controlled by your nausea medication, call the clinic.   BELOW ARE SYMPTOMS THAT SHOULD BE REPORTED IMMEDIATELY:  *FEVER GREATER THAN 100.5 F  *CHILLS WITH OR WITHOUT FEVER  NAUSEA AND VOMITING THAT IS NOT CONTROLLED WITH YOUR NAUSEA MEDICATION  *UNUSUAL SHORTNESS OF BREATH  *UNUSUAL BRUISING OR BLEEDING  TENDERNESS IN MOUTH AND THROAT WITH OR WITHOUT PRESENCE OF ULCERS  *URINARY PROBLEMS  *BOWEL PROBLEMS  UNUSUAL RASH Items with * indicate a potential emergency and should be followed up as soon as possible.  Feel free to call the clinic you have any questions or concerns. The clinic phone number is [phone].  Please show the [facility] at check-in to the Emergency Department and triage nurse.  Paclitaxel injection Taxol What is this medicine? PACLITAXEL (PAK li TAX el) is a chemotherapy drug. It targets fast dividing cells, like cancer cells, and causes these cells to die. This medicine is used to treat ovarian cancer, breast cancer, and other cancers. This medicine may be used for other purposes; ask your health care provider or pharmacist if you have questions. What should I tell my health care provider before I take this medicine? They need to know if you have any of these conditions: -blood disorders -irregular heartbeat -infection (especially a virus infection such as chickenpox, cold sores, or herpes) -liver disease -previous or ongoing radiation therapy -an unusual or allergic reaction to paclitaxel, alcohol, polyoxyethylated castor oil, other chemotherapy agents, other medicines, foods, dyes, or  preservatives -pregnant or trying to get pregnant -breast-feeding How should I use this medicine? This drug is given as an infusion into a vein. It is administered in a hospital or clinic by a specially trained health care professional. Talk to your pediatrician regarding the use of this medicine in children. Special care may be needed. Overdosage: If you think you have taken too much of this medicine contact a poison control center or emergency room at once. NOTE: This medicine is only for you. Do not share this medicine with others. What if I miss a dose? It is important not to miss your dose. Call your doctor or health care professional if you are unable to keep an appointment. What may interact with this medicine? Do not take this medicine with any of the following medications: -disulfiram -metronidazole This medicine may also interact with the following medications: -cyclosporine -diazepam -ketoconazole -medicines to increase blood counts like filgrastim, pegfilgrastim, sargramostim -other chemotherapy drugs like cisplatin, doxorubicin, epirubicin, etoposide, teniposide, vincristine -quinidine -testosterone -vaccines -verapamil Talk to your doctor or health care professional before taking any of these medicines: -acetaminophen -aspirin -ibuprofen -ketoprofen -naproxen This list may not describe all possible interactions. Give your health care provider a list of all the medicines, herbs, non-prescription drugs, or dietary supplements you use. Also tell them if you smoke, drink alcohol, or use illegal drugs. Some items may interact with your medicine. What should I watch for while using this medicine? Your condition will be monitored carefully while you are receiving this medicine. You will need important blood work done while you are taking this medicine. This drug may make you feel generally unwell. This is not uncommon, as chemotherapy can affect  healthy cells as well as cancer  cells. Report any side effects. Continue your course of treatment even though you feel ill unless your doctor tells you to stop. This medicine can cause serious allergic reactions. To reduce your risk you will need to take other medicine(s) before treatment with this medicine. In some cases, you may be given additional medicines to help with side effects. Follow all directions for their use. Call your doctor or health care professional for advice if you get a fever, chills or sore throat, or other symptoms of a cold or flu. Do not treat yourself. This drug decreases your body's ability to fight infections. Try to avoid being around people who are sick. This medicine may increase your risk to bruise or bleed. Call your doctor or health care professional if you notice any unusual bleeding. Be careful brushing and flossing your teeth or using a toothpick because you may get an infection or bleed more easily. If you have any dental work done, tell your dentist you are receiving this medicine. Avoid taking products that contain aspirin, acetaminophen, ibuprofen, naproxen, or ketoprofen unless instructed by your doctor. These medicines may hide a fever. Do not become pregnant while taking this medicine. Women should inform their doctor if they wish to become pregnant or think they might be pregnant. There is a potential for serious side effects to an unborn child. Talk to your health care professional or pharmacist for more information. Do not breast-feed an infant while taking this medicine. Men are advised not to father a child while receiving this medicine. This product may contain alcohol. Ask your pharmacist or healthcare provider if this medicine contains alcohol. Be sure to tell all healthcare providers you are taking this medicine. Certain medicines, like metronidazole and disulfiram, can cause an unpleasant reaction when taken with alcohol. The reaction includes flushing, headache, nausea, vomiting,  sweating, and increased thirst. The reaction can last from 30 minutes to several hours. What side effects may I notice from receiving this medicine? Side effects that you should report to your doctor or health care professional as soon as possible: -allergic reactions like skin rash, itching or hives, swelling of the face, lips, or tongue -low blood counts - This drug may decrease the number of white blood cells, red blood cells and platelets. You may be at increased risk for infections and bleeding. -signs of infection - fever or chills, cough, sore throat, pain or difficulty passing urine -signs of decreased platelets or bleeding - bruising, pinpoint red spots on the skin, black, tarry stools, nosebleeds -signs of decreased red blood cells - unusually weak or tired, fainting spells, lightheadedness -breathing problems -chest pain -high or low blood pressure -mouth sores -nausea and vomiting -pain, swelling, redness or irritation at the injection site -pain, tingling, numbness in the hands or feet -slow or irregular heartbeat -swelling of the ankle, feet, hands Side effects that usually do not require medical attention (report to your doctor or health care professional if they continue or are bothersome): -bone pain -complete hair loss including hair on your head, underarms, pubic hair, eyebrows, and eyelashes -changes in the color of fingernails -diarrhea -loosening of the fingernails -loss of appetite -muscle or joint pain -red flush to skin -sweating This list may not describe all possible side effects. Call your doctor for medical advice about side effects. You may report side effects to FDA at 1-800-FDA-1088. Where should I keep my medicine? This drug is given in a hospital or clinic and will  not be stored at home. NOTE: This sheet is a summary. It may not cover all possible information. If you have questions about this medicine, talk to your doctor, pharmacist, or health care  provider.    2016, Elsevier/[employer] Standard. (2015-04-07 13:02:56)   Carboplatin injection What is this medicine? CARBOPLATIN (KAR boe pla tin) is a chemotherapy drug. It targets fast dividing cells, like cancer cells, and causes these cells to die. This medicine is used to treat ovarian cancer and many other cancers. This medicine may be used for other purposes; ask your health care provider or pharmacist if you have questions. What should I tell my health care provider before I take this medicine? They need to know if you have any of these conditions: -blood disorders -hearing problems -kidney disease -recent or ongoing radiation therapy -an unusual or allergic reaction to carboplatin, cisplatin, other chemotherapy, other medicines, foods, dyes, or preservatives -pregnant or trying to get pregnant -breast-feeding How should I use this medicine? This drug is usually given as an infusion into a vein. It is administered in a hospital or clinic by a specially trained health care professional. Talk to your pediatrician regarding the use of this medicine in children. Special care may be needed. Overdosage: If you think you have taken too much of this medicine contact a poison control center or emergency room at once. NOTE: This medicine is only for you. Do not share this medicine with others. What if I miss a dose? It is important not to miss a dose. Call your doctor or health care professional if you are unable to keep an appointment. What may interact with this medicine? -medicines for seizures -medicines to increase blood counts like filgrastim, pegfilgrastim, sargramostim -some antibiotics like amikacin, gentamicin, neomycin, streptomycin, tobramycin -vaccines Talk to your doctor or health care professional before taking any of these medicines: -acetaminophen -aspirin -ibuprofen -ketoprofen -naproxen This list may not describe all possible interactions. Give your health care  provider a list of all the medicines, herbs, non-prescription drugs, or dietary supplements you use. Also tell them if you smoke, drink alcohol, or use illegal drugs. Some items may interact with your medicine. What should I watch for while using this medicine? Your condition will be monitored carefully while you are receiving this medicine. You will need important blood work done while you are taking this medicine. This drug may make you feel generally unwell. This is not uncommon, as chemotherapy can affect healthy cells as well as cancer cells. Report any side effects. Continue your course of treatment even though you feel ill unless your doctor tells you to stop. In some cases, you may be given additional medicines to help with side effects. Follow all directions for their use. Call your doctor or health care professional for advice if you get a fever, chills or sore throat, or other symptoms of a cold or flu. Do not treat yourself. This drug decreases your body's ability to fight infections. Try to avoid being around people who are sick. This medicine may increase your risk to bruise or bleed. Call your doctor or health care professional if you notice any unusual bleeding. Be careful brushing and flossing your teeth or using a toothpick because you may get an infection or bleed more easily. If you have any dental work done, tell your dentist you are receiving this medicine. Avoid taking products that contain aspirin, acetaminophen, ibuprofen, naproxen, or ketoprofen unless instructed by your doctor. These medicines may hide a fever. Do not become  pregnant while taking this medicine. Women should inform their doctor if they wish to become pregnant or think they might be pregnant. There is a potential for serious side effects to an unborn child. Talk to your health care professional or pharmacist for more information. Do not breast-feed an infant while taking this medicine. What side effects may I  notice from receiving this medicine? Side effects that you should report to your doctor or health care professional as soon as possible: -allergic reactions like skin rash, itching or hives, swelling of the face, lips, or tongue -signs of infection - fever or chills, cough, sore throat, pain or difficulty passing urine -signs of decreased platelets or bleeding - bruising, pinpoint red spots on the skin, black, tarry stools, nosebleeds -signs of decreased red blood cells - unusually weak or tired, fainting spells, lightheadedness -breathing problems -changes in hearing -changes in vision -chest pain -high blood pressure -low blood counts - This drug may decrease the number of white blood cells, red blood cells and platelets. You may be at increased risk for infections and bleeding. -nausea and vomiting -pain, swelling, redness or irritation at the injection site -pain, tingling, numbness in the hands or feet -problems with balance, talking, walking -trouble passing urine or change in the amount of urine Side effects that usually do not require medical attention (report to your doctor or health care professional if they continue or are bothersome): -hair loss -loss of appetite -metallic taste in the mouth or changes in taste This list may not describe all possible side effects. Call your doctor for medical advice about side effects. You may report side effects to FDA at 1-800-FDA-1088. Where should I keep my medicine? This drug is given in a hospital or clinic and will not be stored at home. NOTE: This sheet is a summary. It may not cover all possible information. If you have questions about this medicine, talk to your doctor, pharmacist, or health care provider.    2016, [employer]vier/[employer] Standard. (2007-11-25 14:38:05)

## 2015-11-16 ENCOUNTER — Telehealth: Payer: Self-pay | Admitting: *Deleted

## 2015-11-16 NOTE — Telephone Encounter (Signed)
1st Taxol/Carbo yesterday. Patient states she is doing well. Eating and drinking well. Denies nausea, vomiting or diarrhea. Advised to call with any questions or concerns. She verbalized understanding.

## 2015-11-17 ENCOUNTER — Telehealth: Payer: Self-pay | Admitting: *Deleted

## 2015-11-17 ENCOUNTER — Ambulatory Visit (HOSPITAL_BASED_OUTPATIENT_CLINIC_OR_DEPARTMENT_OTHER): Payer: Federal, State, Local not specified - PPO

## 2015-11-17 ENCOUNTER — Encounter: Payer: Self-pay | Admitting: Nurse Practitioner

## 2015-11-17 ENCOUNTER — Ambulatory Visit (HOSPITAL_BASED_OUTPATIENT_CLINIC_OR_DEPARTMENT_OTHER): Payer: Federal, State, Local not specified - PPO | Admitting: Nurse Practitioner

## 2015-11-17 ENCOUNTER — Telehealth: Payer: Self-pay | Admitting: Nurse Practitioner

## 2015-11-17 VITALS — BP 132/77 | HR 87 | Temp 98.3°F | Resp 17 | Ht 65.0 in | Wt 250.3 lb

## 2015-11-17 DIAGNOSIS — C50412 Malignant neoplasm of upper-outer quadrant of left female breast: Secondary | ICD-10-CM

## 2015-11-17 DIAGNOSIS — E860 Dehydration: Secondary | ICD-10-CM

## 2015-11-17 DIAGNOSIS — R112 Nausea with vomiting, unspecified: Secondary | ICD-10-CM | POA: Diagnosis not present

## 2015-11-17 DIAGNOSIS — R197 Diarrhea, unspecified: Secondary | ICD-10-CM | POA: Diagnosis not present

## 2015-11-17 DIAGNOSIS — T451X5A Adverse effect of antineoplastic and immunosuppressive drugs, initial encounter: Secondary | ICD-10-CM

## 2015-11-17 DIAGNOSIS — Z95828 Presence of other vascular implants and grafts: Secondary | ICD-10-CM

## 2015-11-17 LAB — COMPREHENSIVE METABOLIC PANEL
ALBUMIN: 3.3 g/dL — AB (ref 3.5–5.0)
ALK PHOS: 60 U/L (ref 40–150)
ALT: 27 U/L (ref 0–55)
ANION GAP: 5 meq/L (ref 3–11)
AST: 18 U/L (ref 5–34)
BUN: 15.2 mg/dL (ref 7.0–26.0)
CO2: 25 mEq/L (ref 22–29)
Calcium: 9.6 mg/dL (ref 8.4–10.4)
Chloride: 110 mEq/L — ABNORMAL HIGH (ref 98–109)
Creatinine: 0.7 mg/dL (ref 0.6–1.1)
Glucose: 100 mg/dl (ref 70–140)
Potassium: 3.7 mEq/L (ref 3.5–5.1)
Sodium: 141 mEq/L (ref 136–145)
TOTAL PROTEIN: 6.6 g/dL (ref 6.4–8.3)

## 2015-11-17 LAB — CBC WITH DIFFERENTIAL/PLATELET
BASO%: 0.4 % (ref 0.0–2.0)
Basophils Absolute: 0 10*3/uL (ref 0.0–0.1)
EOS ABS: 0 10*3/uL (ref 0.0–0.5)
EOS%: 0.2 % (ref 0.0–7.0)
HCT: 35.1 % (ref 34.8–46.6)
HEMOGLOBIN: 11.2 g/dL — AB (ref 11.6–15.9)
LYMPH%: 31.3 % (ref 14.0–49.7)
MCH: 27.6 pg (ref 25.1–34.0)
MCHC: 31.9 g/dL (ref 31.5–36.0)
MCV: 86.5 fL (ref 79.5–101.0)
MONO#: 0.4 10*3/uL (ref 0.1–0.9)
MONO%: 9.3 % (ref 0.0–14.0)
NEUT%: 58.8 % (ref 38.4–76.8)
NEUTROS ABS: 2.7 10*3/uL (ref 1.5–6.5)
PLATELETS: 297 10*3/uL (ref 145–400)
RBC: 4.06 10*6/uL (ref 3.70–5.45)
RDW: 21.6 % — AB (ref 11.2–14.5)
WBC: 4.5 10*3/uL (ref 3.9–10.3)
lymph#: 1.4 10*3/uL (ref 0.9–3.3)

## 2015-11-17 MED ORDER — DIPHENOXYLATE-ATROPINE 2.5-0.025 MG PO TABS: 2.0000 | ORAL_TABLET | Freq: Four times a day (QID) | ORAL | Status: DC | PRN

## 2015-11-17 MED ORDER — MORPHINE SULFATE 4 MG/ML IJ SOLN
2.0000 mg | Freq: Once | INTRAMUSCULAR | Status: AC
  Administered 2015-11-17: 2 mg via INTRAVENOUS
  Filled 2015-11-17: qty 1

## 2015-11-17 MED ORDER — HEPARIN SOD (PORK) LOCK FLUSH 100 UNIT/ML IV SOLN
500.0000 [IU] | Freq: Once | INTRAVENOUS | Status: AC
  Administered 2015-11-17: 500 [IU] via INTRAVENOUS
  Filled 2015-11-17: qty 5

## 2015-11-17 MED ORDER — SODIUM CHLORIDE 0.9% FLUSH
10.0000 mL | INTRAVENOUS | Status: DC | PRN
  Administered 2015-11-17 (×2): 10 mL via INTRAVENOUS
  Filled 2015-11-17: qty 10

## 2015-11-17 MED ORDER — MORPHINE SULFATE (PF) 4 MG/ML IV SOLN
INTRAVENOUS | Status: AC
  Filled 2015-11-17: qty 1

## 2015-11-17 MED ORDER — SODIUM CHLORIDE 0.9 % IV SOLN
1500.0000 mL | Freq: Once | INTRAVENOUS | Status: AC
  Administered 2015-11-17: 1500 mL via INTRAVENOUS

## 2015-11-17 MED ORDER — ONDANSETRON HCL 40 MG/20ML IJ SOLN
Freq: Once | INTRAMUSCULAR | Status: AC
  Administered 2015-11-17: 15:00:00 via INTRAVENOUS
  Filled 2015-11-17: qty 4

## 2015-11-17 NOTE — Assessment & Plan Note (Signed)
Patient received her first cycle of neoadjuvant carboplatin/Taxol chemotherapy on Tuesday, 11/15/2015.  Patient states she was initially feeling well following her chemotherapy; but then developed nausea/vomiting and diarrhea with some abdominal cramping on Wednesday afternoon, 11/16/2015.  She has been taking Zofran with only moderate effectiveness.  She feels dehydrated today.  Patient will receive IV fluid rehydration while at the [facility] today; will also receive Zofran IV.  Also, patient was advised she may try the lorazepam for nausea as well.  She also has both Zofran and Compazine at home to try.  Patient may very well need to return tomorrow for additional IV fluid rehydration.  She was also encouraged to push fluids at home as possible.

## 2015-11-17 NOTE — Assessment & Plan Note (Signed)
Patient received her first cycle of neoadjuvant carboplatin/Taxol chemotherapy this past Tuesday, 11/15/2015.  Patient presents to the [facility] today with complaint of nausea, vomiting, diarrhea, and subsequent dehydration.  Patient denies any recent fevers or chills.  See further notes for details regarding dehydration.  Blood counts obtained today revealed a WBC of 4.5, ANC 2.7, hemoglobin 11.2, platelet count 297.  Patient scheduled to return for labs, visit, and her next cycle of chemotherapy on 11/22/2015.

## 2015-11-17 NOTE — Assessment & Plan Note (Signed)
Patient received her first cycle of neoadjuvant carboplatin/Taxol chemotherapy on Tuesday, 11/15/2015.  Patient states she was initially feeling well following her chemotherapy; but then developed nausea/vomiting and diarrhea with some abdominal cramping on Wednesday afternoon, 11/16/2015.  She has been taking Zofran with only moderate effectiveness.  She feels dehydrated today.  Patient will receive IV fluid rehydration while at the [facility] today; will also receive Zofran IV.  Also, patient was advised she may try the lorazepam for nausea as well.  She also has both Zofran and Compazine at home to try.

## 2015-11-17 NOTE — Patient Instructions (Signed)
Diarrhea °Diarrhea is frequent loose and watery bowel movements. It can cause you to feel weak and dehydrated. Dehydration can cause you to become tired and thirsty, have a dry mouth, and have decreased urination that often is dark yellow. Diarrhea is a sign of another problem, most often an infection that will not last long. In most cases, diarrhea typically lasts 2-3 days. However, it can last longer if it is a sign of something more serious. It is important to treat your diarrhea as directed by your caregiver to lessen or prevent future episodes of diarrhea. °CAUSES  °Some common causes include: °· Gastrointestinal infections caused by viruses, bacteria, or parasites. °· Food poisoning or food allergies. °· Certain medicines, such as antibiotics, chemotherapy, and laxatives. °· Artificial sweeteners and fructose. °· Digestive disorders. °HOME CARE INSTRUCTIONS °· Ensure adequate fluid intake (hydration): Have 1 cup (8 oz) of fluid for each diarrhea episode. Avoid fluids that contain simple sugars or sports drinks, fruit juices, whole milk products, and sodas. Your urine should be clear or pale yellow if you are drinking enough fluids. Hydrate with an oral rehydration solution that you can purchase at pharmacies, retail stores, and online. You can prepare an oral rehydration solution at home by mixing the following ingredients together: °¨  - tsp table salt. °¨ ¾ tsp baking soda. °¨  tsp salt substitute containing potassium chloride. °¨ 1  tablespoons sugar. °¨ 1 L (34 oz) of water. °· Certain foods and beverages may increase the speed at which food moves through the gastrointestinal (GI) tract. These foods and beverages should be avoided and include: °¨ Caffeinated and alcoholic beverages. °¨ High-fiber foods, such as raw fruits and vegetables, nuts, seeds, and whole grain breads and cereals. °¨ Foods and beverages sweetened with sugar alcohols, such as xylitol, sorbitol, and mannitol. °· Some foods may be well  tolerated and may help thicken stool including: °¨ Starchy foods, such as rice, toast, pasta, low-sugar cereal, oatmeal, grits, baked potatoes, crackers, and bagels. °¨ Bananas. °¨ Applesauce. °· Add probiotic-rich foods to help increase healthy bacteria in the GI tract, such as yogurt and fermented milk products. °· Wash your hands well after each diarrhea episode. °· Only take over-the-counter or prescription medicines as directed by your caregiver. °· Take a warm bath to relieve any burning or pain from frequent diarrhea episodes. °SEEK IMMEDIATE MEDICAL CARE IF:  °· You are unable to keep fluids down. °· You have persistent vomiting. °· You have blood in your stool, or your stools are black and tarry. °· You do not urinate in 6-8 hours, or there is only a small amount of very dark urine. °· You have abdominal pain that increases or localizes. °· You have weakness, dizziness, confusion, or light-headedness. °· You have a severe headache. °· Your diarrhea gets worse or does not get better. °· You have a fever or persistent symptoms for more than 2-3 days. °· You have a fever and your symptoms suddenly get worse. °MAKE SURE YOU:  °· Understand these instructions. °· Will watch your condition. °· Will get help right away if you are not doing well or get worse. °  °This information is not intended to replace advice given to you by your health care provider. Make sure you discuss any questions you have with your health care provider. °  °Document Released: 08/10/2002 Document Revised: 09/10/2014 Document Reviewed: 04/27/2012 °Elsevier Interactive Patient Education ©2016 [employer] °Dehydration, Adult °Dehydration is a condition in which you do not have   enough fluid or water in your body. It happens when you take in less fluid than you lose. Vital organs such as the kidneys, brain, and heart cannot function without a proper amount of fluids. Any loss of fluids from the body can cause dehydration.  °Dehydration can  range from mild to severe. This condition should be treated right away to help prevent it from becoming severe. °CAUSES  °This condition may be caused by: °· Vomiting. °· Diarrhea. °· Excessive sweating, such as when exercising in hot or humid weather. °· Not drinking enough fluid during strenuous exercise or during an illness. °· Excessive urine output. °· Fever. °· Certain medicines. °RISK FACTORS °This condition is more likely to develop in: °· People who are taking certain medicines that cause the body to lose excess fluid (diuretics).   °· People who have a chronic illness, such as diabetes, that may increase urination. °· Older adults.   °· People who live at high altitudes.   °· People who participate in endurance sports.   °SYMPTOMS  °Mild Dehydration °· Thirst. °· Dry lips. °· Slightly dry mouth. °· Dry, warm skin. °Moderate Dehydration °· Very dry mouth.   °· Muscle cramps.   °· Dark urine and decreased urine production.   °· Decreased tear production.   °· Headache.   °· Light-headedness, especially when you stand up from a sitting position.   °Severe Dehydration °· Changes in skin.   °¨ Cold and clammy skin.   °¨ Skin does not spring back quickly when lightly pinched and released.   °· Changes in body fluids.   °¨ Extreme thirst.   °¨ No tears.   °¨ Not able to sweat when body temperature is high, such as in hot weather.   °¨ Minimal urine production.   °· Changes in vital signs.   °¨ Rapid, weak pulse (more than 100 beats per minute when you are sitting still).   °¨ Rapid breathing.   °¨ Low blood pressure.   °· Other changes.   °¨ Sunken eyes.   °¨ Cold hands and feet.   °¨ Confusion. °¨ Lethargy and difficulty being awakened. °¨ Fainting (syncope).   °¨ Short-term weight loss.   °¨ Unconsciousness. °DIAGNOSIS  °This condition may be diagnosed based on your symptoms. You may also have tests to determine how severe your dehydration is. These tests may include:  °· Urine tests.   °· Blood tests.    °TREATMENT  °Treatment for this condition depends on the severity. Mild or moderate dehydration can often be treated at home. Treatment should be started right away. Do not wait until dehydration becomes severe. Severe dehydration needs to be treated at the hospital. °Treatment for Mild Dehydration °· Drinking plenty of water to replace the fluid you have lost.   °· Replacing minerals in your blood (electrolytes) that you may have lost.   °Treatment for Moderate Dehydration  °· Consuming oral rehydration solution (ORS). °Treatment for Severe Dehydration °· Receiving fluid through an IV tube.   °· Receiving electrolyte solution through a feeding tube that is passed through your nose and into your stomach (nasogastric tube or NG tube). °· Correcting any abnormalities in electrolytes. °HOME CARE INSTRUCTIONS  °· Drink enough fluid to keep your urine clear or pale yellow.   °· Drink water or fluid slowly by taking small sips. You can also try sucking on ice cubes.  °· Have food or beverages that contain electrolytes. Examples include bananas and sports drinks. °· Take over-the-counter and prescription medicines only as told by your health care provider.   °· Prepare ORS according to the manufacturer's instructions. Take sips of ORS every 5 minutes until your urine returns to normal. °·   If you have vomiting or diarrhea, continue to try to drink water, ORS, or both.   °· If you have diarrhea, avoid:   °¨ Beverages that contain caffeine.   °¨ Fruit juice.   °¨ Milk.    °¨ Carbonated soft drinks. °· Do not take salt tablets. This can lead to the condition of having too much sodium in your body (hypernatremia).   °SEEK MEDICAL CARE IF: °· You cannot eat or drink without vomiting. °· You have had moderate diarrhea during a period of more than 24 hours. °· You have a fever. °SEEK IMMEDIATE MEDICAL CARE IF:  °· You have extreme thirst. °· You have severe diarrhea. °· You have not urinated in 6-8 hours, or you have urinated  only a small amount of very dark urine. °· You have shriveled skin. °· You are dizzy, confused, or both. °  °This information is not intended to replace advice given to you by your health care provider. Make sure you discuss any questions you have with your health care provider. °  °Document Released: 08/20/2005 Document Revised: 05/11/2015 Document Reviewed: 01/05/2015 °Elsevier Interactive Patient Education ©2016 [employer] Inc. ° °

## 2015-11-17 NOTE — Progress Notes (Signed)
Pt stable after IVF's. VSS good and reports feeling much better. Nausea and pain improved as well. Told pt to monitor nausea, vomiting, diarrhea and temperature at home. Told pt to call if she is not doing well and still having symptoms. Pt unable to provide nurse with stool sample to check for cdiff at this time.

## 2015-11-17 NOTE — Telephone Encounter (Signed)
per po ffor pt to follow appts as scheduled °

## 2015-11-17 NOTE — Telephone Encounter (Signed)
Pt called with c/o diarrhea that started on 11/16/15. She relayed she had 7 episodes on 11/16/15 and so far 3 episodes on 11/17/15. Relates her stomach is "cramping" and she "can't stop pooping". Pt relate she started the imodium on 11/16/15 and taking as directed without relief. POF placed for pt to see [facility] at 2pm with labs at 1:30, orders placed.  Pt denies further needs at this time.

## 2015-11-17 NOTE — Assessment & Plan Note (Signed)
Patient received her first cycle of neoadjuvant carboplatin/Taxol chemotherapy on Tuesday, 11/15/2015.  Patient states she was initially feeling well following her chemotherapy; but then developed nausea/vomiting and diarrhea with some abdominal cramping on Wednesday afternoon, 11/16/2015.  She has been taking Zofran with only moderate effectiveness.  She feels dehydrated today.  Patient will receive IV fluid rehydration while at the [place] today; will also receive Zofran IV.  Also, patient was advised she may try the lorazepam for nausea as well.  She also has both Zofran and Compazine at home to try.

## 2015-11-17 NOTE — Assessment & Plan Note (Signed)
Patient received her first cycle of neoadjuvant carboplatin/Taxol chemotherapy on Tuesday, 11/15/2015.  Patient states she was initially feeling well following her chemotherapy; but then developed nausea/vomiting and diarrhea with some abdominal cramping on Wednesday afternoon, 11/16/2015.  She has been taking Zofran with only moderate effectiveness.  She feels dehydrated today.  She has been taking Imodium for the diarrhea.   Patient will receive IV fluid rehydration while at the [place] today; will also receive Zofran IV.  Also, patient was advised she may try the lorazepam for nausea as well.  She also has both Zofran and Compazine at home to try.  Will also prescribe Lomotil for the diarrhea.  Pt was advised to alternate the Lomotil and Imodium.  Patient has also been requested to obtain a stool sent to rule out C. difficile; since patient did receive antibiotics last week for treatment of a UTI.

## 2015-11-17 NOTE — Progress Notes (Signed)
SYMPTOM MANAGEMENT CLINIC   HPI: Cindy Bradshaw 45 y.o. female diagnosed with the breast cancer.  Currently undergoing neoadjuvant carbo plan/Taxol chemotherapy regimen.   Patient received her first cycle of neoadjuvant carboplatin/Taxol chemotherapy on Tuesday, 11/15/2015.  Patient states she was initially feeling well following her chemotherapy; but then developed nausea/vomiting and diarrhea with some abdominal cramping on Wednesday afternoon, 11/16/2015.  She has been taking Zofran with only moderate effectiveness.  She feels dehydrated today.  She has been taking Imodium for the diarrhea.   Patient will receive IV fluid rehydration while at the [place] today; will also receive Zofran IV.  Also, patient was advised she may try the lorazepam for nausea as well.  She also has both Zofran and Compazine at home to try.  Will also prescribe Lomotil for the diarrhea.  Pt was advised to alternate the Lomotil and Imodium.  Patient has also been requested to obtain a stool sent to rule out C. difficile; since patient did receive antibiotics last week for treatment of a UTI.  HPI  Review of Systems  Constitutional: Positive for chills and malaise/fatigue. Negative for fever.  Gastrointestinal: Positive for nausea, vomiting, abdominal pain and diarrhea.  All other systems reviewed and are negative.   Past Medical History  Diagnosis Date  . Cancer ([place])   . Migraine   . Asthma   . Breast cancer ([place])   . Arthritis   . Back disorder     degenerative disk disease  . Pap smear for cervical cancer screening 08/12/15  . Mammogram abnormal 08/24/15    first  . GERD (gastroesophageal reflux disease)     Past Surgical History  Procedure Laterality Date  . Cholecystectomy    . Cesarean section    . Portacath placement [name] 09/12/2015    Procedure: INSERTION PORT-A-CATH;  Surgeon: Excell Seltzer, MD;  Location: [facility];  Service: General;  Laterality: [name];    has Breast cancer  of upper-outer quadrant of left female breast ([place]); Normocytic normochromic anemia; Family history of breast cancer in female; Dehydration; Chemotherapy induced nausea and vomiting; Conjunctivitis, acute, left eye; Genetic testing; and Diarrhea on her problem list.    is allergic to dilaudid; ivp dye; percocet; and toradol.    Medication List       This list is accurate as of: 11/17/15  3:45 PM.  Always use your most recent med list.               diphenoxylate-atropine 2.5-0.025 MG tablet  Commonly known as:  LOMOTIL  Take 2 tablets by mouth 4 (four) times daily as needed for diarrhea or loose stools.     lidocaine-prilocaine cream  Commonly known as:  EMLA  Apply to affected area as directed.     LORazepam 0.5 MG tablet  Commonly known as:  ATIVAN  Take 0.5 mg by mouth at bedtime.     ondansetron 8 MG tablet  Commonly known as:  ZOFRAN  Take 1 tablet (8 mg total) by mouth every 8 (eight) hours as needed for nausea or vomiting.     PROAIR HFA 108 (90 Base) MCG/ACT inhaler  Generic drug:  albuterol  Inhale 2 puffs into the lungs every 4 (four) hours as needed. For shortness of breath     prochlorperazine 10 MG tablet  Commonly known as:  COMPAZINE  Reported on 11/17/2015         PHYSICAL EXAMINATION  Oncology Vitals 11/17/2015 11/15/2015  Height 165 cm -  Weight 113.535 kg -  Weight (lbs) 250 lbs 5 oz -  BMI (kg/m2) 41.65 kg/m2 -  Temp 98.3 98.8  Pulse 87 105  Resp 17 18  SpO2 100 100  BSA (m2) 2.28 m2 -   BP Readings from Last 2 Encounters:  11/17/15 132/77  11/15/15 130/70    Physical Exam  Constitutional: She is oriented to person, place, and time. She appears dehydrated. She appears unhealthy. She appears distressed.  HENT:  Head: Normocephalic and atraumatic.  Mouth/Throat: Oropharynx is clear and moist.  Eyes: Conjunctivae and EOM are normal. Pupils are equal, round, and reactive to light. Right eye exhibits no discharge. Left eye exhibits no  discharge. No scleral icterus.  Neck: Normal range of motion.  Pulmonary/Chest: Effort normal. No respiratory distress.  Musculoskeletal: Normal range of motion.  Neurological: She is alert and oriented to person, place, and time. Gait normal.  Psychiatric: Affect normal.  Nursing note and vitals reviewed.   LABORATORY DATA:. Appointment on 11/17/2015  Component Date Value Ref Range Status  . WBC 11/17/2015 4.5  3.9 - 10.3 10e3/uL Final  . NEUT# 11/17/2015 2.7  1.5 - 6.5 10e3/uL Final  . HGB 11/17/2015 11.2* 11.6 - 15.9 g/dL Final  . HCT 11/17/2015 35.1  34.8 - 46.6 % Final  . Platelets 11/17/2015 297  145 - 400 10e3/uL Final  . MCV 11/17/2015 86.5  79.5 - 101.0 fL Final  . MCH 11/17/2015 27.6  25.1 - 34.0 pg Final  . MCHC 11/17/2015 31.9  31.5 - 36.0 g/dL Final  . RBC 11/17/2015 4.06  3.70 - 5.45 10e6/uL Final  . RDW 11/17/2015 21.6* 11.2 - 14.5 % Final  . lymph# 11/17/2015 1.4  0.9 - 3.3 10e3/uL Final  . MONO# 11/17/2015 0.4  0.1 - 0.9 10e3/uL Final  . Eosinophils Absolute 11/17/2015 0.0  0.0 - 0.5 10e3/uL Final  . Basophils Absolute 11/17/2015 0.0  0.0 - 0.1 10e3/uL Final  . NEUT% 11/17/2015 58.8  38.4 - 76.8 % Final  . LYMPH% 11/17/2015 31.3  14.0 - 49.7 % Final  . MONO% 11/17/2015 9.3  0.0 - 14.0 % Final  . EOS% 11/17/2015 0.2  0.0 - 7.0 % Final  . BASO% 11/17/2015 0.4  0.0 - 2.0 % Final  . Sodium 11/17/2015 141  136 - 145 mEq/L Final  . Potassium 11/17/2015 3.7  3.5 - 5.1 mEq/L Final  . Chloride 11/17/2015 110* 98 - 109 mEq/L Final  . CO2 11/17/2015 25  22 - 29 mEq/L Final  . Glucose 11/17/2015 100  70 - 140 mg/dl Final   Glucose reference range is for nonfasting patients. Fasting glucose reference range is 70- 100.  Marland Kitchen BUN 11/17/2015 15.2  7.0 - 26.0 mg/dL Final  . Creatinine 11/17/2015 0.7  0.6 - 1.1 mg/dL Final  . Total Bilirubin 11/17/2015 <0.30  0.20 - 1.20 mg/dL Final  . Alkaline Phosphatase 11/17/2015 60  40 - 150 U/L Final  . AST 11/17/2015 18  5 - 34 U/L Final    . ALT 11/17/2015 27  0 - 55 U/L Final  . Total Protein 11/17/2015 6.6  6.4 - 8.3 g/dL Final  . Albumin 11/17/2015 3.3* 3.5 - 5.0 g/dL Final  . Calcium 11/17/2015 9.6  8.4 - 10.4 mg/dL Final  . Anion Gap 11/17/2015 5  3 - 11 mEq/L Final  . EGFR 11/17/2015 >90  >90 ml/min/1.73 m2 Final   eGFR is calculated using the CKD-EPI Creatinine Equation (2009)  Appointment on 11/15/2015  Component Date  Value Ref Range Status  . WBC 11/15/2015 6.9  3.9 - 10.3 10e3/uL Final  . NEUT# 11/15/2015 5.2  1.5 - 6.5 10e3/uL Final  . HGB 11/15/2015 11.0* 11.6 - 15.9 g/dL Final  . HCT 11/15/2015 34.8  34.8 - 46.6 % Final  . Platelets 11/15/2015 253  145 - 400 10e3/uL Final  . MCV 11/15/2015 86.2  79.5 - 101.0 fL Final  . MCH 11/15/2015 27.4  25.1 - 34.0 pg Final  . MCHC 11/15/2015 31.8  31.5 - 36.0 g/dL Final  . RBC 11/15/2015 4.03  3.70 - 5.45 10e6/uL Final  . RDW 11/15/2015 23.6* 11.2 - 14.5 % Final  . lymph# 11/15/2015 0.9  0.9 - 3.3 10e3/uL Final  . MONO# 11/15/2015 0.6  0.1 - 0.9 10e3/uL Final  . Eosinophils Absolute 11/15/2015 0.1  0.0 - 0.5 10e3/uL Final  . Basophils Absolute 11/15/2015 0.1  0.0 - 0.1 10e3/uL Final  . NEUT% 11/15/2015 75.2  38.4 - 76.8 % Final  . LYMPH% 11/15/2015 13.5* 14.0 - 49.7 % Final  . MONO% 11/15/2015 9.4  0.0 - 14.0 % Final  . EOS% 11/15/2015 0.9  0.0 - 7.0 % Final  . BASO% 11/15/2015 1.0  0.0 - 2.0 % Final  . Sodium 11/15/2015 140  136 - 145 mEq/L Final  . Potassium 11/15/2015 3.8  3.5 - 5.1 mEq/L Final  . Chloride 11/15/2015 110* 98 - 109 mEq/L Final  . CO2 11/15/2015 23  22 - 29 mEq/L Final  . Glucose 11/15/2015 107  70 - 140 mg/dl Final   Glucose reference range is for nonfasting patients. Fasting glucose reference range is 70- 100.  Marland Kitchen BUN 11/15/2015 11.0  7.0 - 26.0 mg/dL Final  . Creatinine 11/15/2015 0.8  0.6 - 1.1 mg/dL Final  . Total Bilirubin 11/15/2015 <0.30  0.20 - 1.20 mg/dL Final  . Alkaline Phosphatase 11/15/2015 73  40 - 150 U/L Final  . AST 11/15/2015  17  5 - 34 U/L Final  . ALT 11/15/2015 29  0 - 55 U/L Final  . Total Protein 11/15/2015 6.8  6.4 - 8.3 g/dL Final  . Albumin 11/15/2015 3.5  3.5 - 5.0 g/dL Final  . Calcium 11/15/2015 10.1  8.4 - 10.4 mg/dL Final  . Anion Gap 11/15/2015 7  3 - 11 mEq/L Final  . EGFR 11/15/2015 >90  >90 ml/min/1.73 m2 Final   eGFR is calculated using the CKD-EPI Creatinine Equation (2009)     RADIOGRAPHIC STUDIES: No results found.  ASSESSMENT/PLAN:    Nausea with vomiting Patient received her first cycle of neoadjuvant carboplatin/Taxol chemotherapy on Tuesday, 11/15/2015.  Patient states she was initially feeling well following her chemotherapy; but then developed nausea/vomiting and diarrhea with some abdominal cramping on Wednesday afternoon, 11/16/2015.  She has been taking Zofran with only moderate effectiveness.  She feels dehydrated today.  Patient will receive IV fluid rehydration while at the [place] today; will also receive Zofran IV.  Also, patient was advised she may try the lorazepam for nausea as well.  She also has both Zofran and Compazine at home to try.  Dehydration Patient received her first cycle of neoadjuvant carboplatin/Taxol chemotherapy on Tuesday, 11/15/2015.  Patient states she was initially feeling well following her chemotherapy; but then developed nausea/vomiting and diarrhea with some abdominal cramping on Wednesday afternoon, 11/16/2015.  She has been taking Zofran with only moderate effectiveness.  She feels dehydrated today.  Patient will receive IV fluid rehydration while at the [place] today; will also  receive Zofran IV.  Also, patient was advised she may try the lorazepam for nausea as well.  She also has both Zofran and Compazine at home to try.  Patient may very well need to return tomorrow for additional IV fluid rehydration.  She was also encouraged to push fluids at home as possible.  Chemotherapy induced nausea and vomiting Patient received her  first cycle of neoadjuvant carboplatin/Taxol chemotherapy on Tuesday, 11/15/2015.  Patient states she was initially feeling well following her chemotherapy; but then developed nausea/vomiting and diarrhea with some abdominal cramping on Wednesday afternoon, 11/16/2015.  She has been taking Zofran with only moderate effectiveness.  She feels dehydrated today.  Patient will receive IV fluid rehydration while at the [place] today; will also receive Zofran IV.  Also, patient was advised she may try the lorazepam for nausea as well.  She also has both Zofran and Compazine at home to try.  Diarrhea Patient received her first cycle of neoadjuvant carboplatin/Taxol chemotherapy on Tuesday, 11/15/2015.  Patient states she was initially feeling well following her chemotherapy; but then developed nausea/vomiting and diarrhea with some abdominal cramping on Wednesday afternoon, 11/16/2015.  She has been taking Zofran with only moderate effectiveness.  She feels dehydrated today.  She has been taking Imodium for the diarrhea.   Patient will receive IV fluid rehydration while at the [place] today; will also receive Zofran IV.  Also, patient was advised she may try the lorazepam for nausea as well.  She also has both Zofran and Compazine at home to try.  Will also prescribe Lomotil for the diarrhea.  Pt was advised to alternate the Lomotil and Imodium.  Patient has also been requested to obtain a stool sent to rule out C. difficile; since patient did receive antibiotics last week for treatment of a UTI.  Breast cancer of upper-outer quadrant of left female breast [facility]) Patient received her first cycle of neoadjuvant carboplatin/Taxol chemotherapy this past Tuesday, 11/15/2015.  Patient presents to the [place] today with complaint of nausea, vomiting, diarrhea, and subsequent dehydration.  Patient denies any recent fevers or chills.  See further notes for details regarding  dehydration.  Blood counts obtained today revealed a WBC of 4.5, ANC 2.7, hemoglobin 11.2, platelet count 297.  Patient scheduled to return for labs, visit, and her next cycle of chemotherapy on 11/22/2015.  Patient stated understanding of all instructions; and was in agreement with this plan of care. The patient knows to call the clinic with any problems, questions or concerns.   Review/collaboration with Dr. Lindi Adie regarding all aspects of patient's visit today.   Total time spent with patient was 25 minutes;  with greater than 75 percent of that time spent in face to face counseling regarding patient's symptoms,  and coordination of care and follow up.  Disclaimer:This dictation was prepared with Dragon/digital dictation along with [employer]. Any transcriptional errors that result from this process are unintentional.  Drue Second, NP 11/17/2015

## 2015-11-17 NOTE — Telephone Encounter (Signed)
Added [facility] appt and lab appt today per CB 3/16 pof

## 2015-11-18 ENCOUNTER — Other Ambulatory Visit: Payer: Self-pay | Admitting: *Deleted

## 2015-11-18 ENCOUNTER — Telehealth: Payer: Self-pay | Admitting: *Deleted

## 2015-11-18 ENCOUNTER — Ambulatory Visit (HOSPITAL_BASED_OUTPATIENT_CLINIC_OR_DEPARTMENT_OTHER): Payer: Federal, State, Local not specified - PPO

## 2015-11-18 VITALS — BP 144/74 | HR 106 | Temp 98.7°F | Resp 18

## 2015-11-18 DIAGNOSIS — R1013 Epigastric pain: Secondary | ICD-10-CM

## 2015-11-18 DIAGNOSIS — R110 Nausea: Secondary | ICD-10-CM

## 2015-11-18 DIAGNOSIS — C50412 Malignant neoplasm of upper-outer quadrant of left female breast: Secondary | ICD-10-CM | POA: Diagnosis not present

## 2015-11-18 DIAGNOSIS — E860 Dehydration: Secondary | ICD-10-CM

## 2015-11-18 DIAGNOSIS — R51 Headache: Secondary | ICD-10-CM

## 2015-11-18 MED ORDER — SODIUM CHLORIDE 0.9 % IV SOLN
INTRAVENOUS | Status: DC
  Administered 2015-11-18: 15:00:00 via INTRAVENOUS

## 2015-11-18 MED ORDER — HEPARIN SOD (PORK) LOCK FLUSH 100 UNIT/ML IV SOLN
500.0000 [IU] | Freq: Once | INTRAVENOUS | Status: AC
  Administered 2015-11-18: 500 [IU] via INTRAVENOUS
  Filled 2015-11-18: qty 5

## 2015-11-18 MED ORDER — MORPHINE SULFATE (PF) 4 MG/ML IV SOLN
INTRAVENOUS | Status: AC
  Filled 2015-11-18: qty 1

## 2015-11-18 MED ORDER — TRAMADOL HCL 50 MG PO TABS: 50.0000 mg | ORAL_TABLET | Freq: Four times a day (QID) | ORAL | Status: DC | PRN

## 2015-11-18 MED ORDER — SODIUM CHLORIDE 0.9% FLUSH
10.0000 mL | Freq: Once | INTRAVENOUS | Status: AC
  Administered 2015-11-18: 10 mL via INTRAVENOUS
  Filled 2015-11-18: qty 10

## 2015-11-18 MED ORDER — MORPHINE SULFATE 4 MG/ML IJ SOLN
2.0000 mg | Freq: Once | INTRAMUSCULAR | Status: AC
  Administered 2015-11-18: 2 mg via INTRAVENOUS
  Filled 2015-11-18: qty 1

## 2015-11-18 MED ORDER — ONDANSETRON HCL 4 MG/2ML IJ SOLN: 8.0000 mg | Freq: Once | INTRAMUSCULAR | Status: DC

## 2015-11-18 MED ORDER — SODIUM CHLORIDE 0.9 % IV SOLN
Freq: Once | INTRAVENOUS | Status: AC
  Administered 2015-11-18: 15:00:00 via INTRAVENOUS
  Filled 2015-11-18: qty 4

## 2015-11-18 NOTE — Progress Notes (Signed)
Patient called [facility] desk to report she has decided she needs fluids, pain medication and nausea medication today. She has not had any further diarrhea, she complains of cramping in her abd. She continues to be nauseated. Pt is on her way now for IVF. Orders signed and held.

## 2015-11-18 NOTE — Patient Instructions (Signed)

## 2015-11-18 NOTE — Addendum Note (Signed)
Addended by: Jethro Bolus A on: 11/18/2015 03:42 PM   Modules accepted: Orders

## 2015-11-18 NOTE — Telephone Encounter (Signed)
TC to pt- she is feeling better today. Still having some weakness and a headache. She had just taken her pain medication and it has not begun to work at time of call. She had only 1 instance of a soft stool but was not able to get a sample. She declines fluids again today and states she does not feel that is necessary again. Pt advised to call this office if she does not continue to improve or decides she does want IVF. She will attempt to get a stool sample next time she has a BM to bring to the office lab.

## 2015-11-18 NOTE — Progress Notes (Signed)
Pt requesting stronger pain medication for home use. She has used Tramadol in the past without adverse effects. Per Selena Lesser, FNP script printed and given to pt in treatment room.

## 2015-11-21 ENCOUNTER — Ambulatory Visit: Payer: Self-pay | Admitting: Hematology and Oncology

## 2015-11-21 ENCOUNTER — Other Ambulatory Visit: Payer: Self-pay

## 2015-11-21 ENCOUNTER — Ambulatory Visit: Payer: Self-pay

## 2015-11-21 DIAGNOSIS — C50412 Malignant neoplasm of upper-outer quadrant of left female breast: Secondary | ICD-10-CM

## 2015-11-22 ENCOUNTER — Other Ambulatory Visit (HOSPITAL_BASED_OUTPATIENT_CLINIC_OR_DEPARTMENT_OTHER): Payer: Federal, State, Local not specified - PPO

## 2015-11-22 ENCOUNTER — Ambulatory Visit (HOSPITAL_BASED_OUTPATIENT_CLINIC_OR_DEPARTMENT_OTHER): Payer: Federal, State, Local not specified - PPO | Admitting: Hematology and Oncology

## 2015-11-22 ENCOUNTER — Telehealth: Payer: Self-pay | Admitting: *Deleted

## 2015-11-22 ENCOUNTER — Ambulatory Visit (HOSPITAL_BASED_OUTPATIENT_CLINIC_OR_DEPARTMENT_OTHER): Payer: Federal, State, Local not specified - PPO

## 2015-11-22 ENCOUNTER — Encounter: Payer: Self-pay | Admitting: Hematology and Oncology

## 2015-11-22 ENCOUNTER — Telehealth: Payer: Self-pay | Admitting: Hematology and Oncology

## 2015-11-22 VITALS — BP 124/77 | HR 93 | Temp 98.1°F | Resp 19 | Wt 249.8 lb

## 2015-11-22 DIAGNOSIS — Z5111 Encounter for antineoplastic chemotherapy: Secondary | ICD-10-CM | POA: Diagnosis not present

## 2015-11-22 DIAGNOSIS — C50412 Malignant neoplasm of upper-outer quadrant of left female breast: Secondary | ICD-10-CM | POA: Diagnosis not present

## 2015-11-22 DIAGNOSIS — D649 Anemia, unspecified: Secondary | ICD-10-CM | POA: Diagnosis not present

## 2015-11-22 LAB — CBC WITH DIFFERENTIAL/PLATELET
BASO%: 0.7 % (ref 0.0–2.0)
Basophils Absolute: 0.1 10*3/uL (ref 0.0–0.1)
EOS%: 1.8 % (ref 0.0–7.0)
Eosinophils Absolute: 0.1 10*3/uL (ref 0.0–0.5)
HCT: 33.5 % — ABNORMAL LOW (ref 34.8–46.6)
HEMOGLOBIN: 10.9 g/dL — AB (ref 11.6–15.9)
LYMPH%: 19.8 % (ref 14.0–49.7)
MCH: 28.2 pg (ref 25.1–34.0)
MCHC: 32.5 g/dL (ref 31.5–36.0)
MCV: 86.8 fL (ref 79.5–101.0)
MONO#: 0.5 10*3/uL (ref 0.1–0.9)
MONO%: 6.9 % (ref 0.0–14.0)
NEUT%: 70.8 % (ref 38.4–76.8)
NEUTROS ABS: 5.2 10*3/uL (ref 1.5–6.5)
Platelets: 447 10*3/uL — ABNORMAL HIGH (ref 145–400)
RBC: 3.86 10*6/uL (ref 3.70–5.45)
RDW: 21.4 % — AB (ref 11.2–14.5)
WBC: 7.4 10*3/uL (ref 3.9–10.3)
lymph#: 1.5 10*3/uL (ref 0.9–3.3)

## 2015-11-22 LAB — COMPREHENSIVE METABOLIC PANEL
ALBUMIN: 3.3 g/dL — AB (ref 3.5–5.0)
ALT: 73 U/L — AB (ref 0–55)
AST: 41 U/L — AB (ref 5–34)
Alkaline Phosphatase: 56 U/L (ref 40–150)
Anion Gap: 6 mEq/L (ref 3–11)
BUN: 12.9 mg/dL (ref 7.0–26.0)
CO2: 25 meq/L (ref 22–29)
Calcium: 9.7 mg/dL (ref 8.4–10.4)
Chloride: 110 mEq/L — ABNORMAL HIGH (ref 98–109)
Creatinine: 0.7 mg/dL (ref 0.6–1.1)
GLUCOSE: 99 mg/dL (ref 70–140)
Potassium: 3.8 mEq/L (ref 3.5–5.1)
SODIUM: 140 meq/L (ref 136–145)
TOTAL PROTEIN: 6.6 g/dL (ref 6.4–8.3)

## 2015-11-22 MED ORDER — FAMOTIDINE IN NACL 20-0.9 MG/50ML-% IV SOLN
20.0000 mg | Freq: Once | INTRAVENOUS | Status: AC
  Administered 2015-11-22: 20 mg via INTRAVENOUS

## 2015-11-22 MED ORDER — PALONOSETRON HCL INJECTION 0.25 MG/5ML
INTRAVENOUS | Status: AC
  Filled 2015-11-22: qty 5

## 2015-11-22 MED ORDER — SODIUM CHLORIDE 0.9 % IV SOLN
300.0000 mg | Freq: Once | INTRAVENOUS | Status: AC
  Administered 2015-11-22: 300 mg via INTRAVENOUS
  Filled 2015-11-22: qty 30

## 2015-11-22 MED ORDER — DEXTROSE 5 % IV SOLN
50.0000 mg/m2 | Freq: Once | INTRAVENOUS | Status: AC
  Administered 2015-11-22: 114 mg via INTRAVENOUS
  Filled 2015-11-22: qty 19

## 2015-11-22 MED ORDER — SODIUM CHLORIDE 0.9 % IV SOLN
Freq: Once | INTRAVENOUS | Status: AC
  Administered 2015-11-22: 12:00:00 via INTRAVENOUS
  Filled 2015-11-22: qty 1

## 2015-11-22 MED ORDER — PALONOSETRON HCL INJECTION 0.25 MG/5ML
0.2500 mg | Freq: Once | INTRAVENOUS | Status: AC
  Administered 2015-11-22: 0.25 mg via INTRAVENOUS

## 2015-11-22 MED ORDER — SODIUM CHLORIDE 0.9 % IJ SOLN
10.0000 mL | INTRAMUSCULAR | Status: DC | PRN
  Administered 2015-11-22: 10 mL
  Filled 2015-11-22: qty 10

## 2015-11-22 MED ORDER — DIPHENHYDRAMINE HCL 50 MG/ML IJ SOLN
25.0000 mg | Freq: Once | INTRAMUSCULAR | Status: AC
  Administered 2015-11-22: 25 mg via INTRAVENOUS

## 2015-11-22 MED ORDER — FAMOTIDINE IN NACL 20-0.9 MG/50ML-% IV SOLN
INTRAVENOUS | Status: AC
  Filled 2015-11-22: qty 50

## 2015-11-22 MED ORDER — HEPARIN SOD (PORK) LOCK FLUSH 100 UNIT/ML IV SOLN
500.0000 [IU] | Freq: Once | INTRAVENOUS | Status: AC | PRN
  Administered 2015-11-22: 500 [IU]
  Filled 2015-11-22: qty 5

## 2015-11-22 MED ORDER — SODIUM CHLORIDE 0.9 % IV SOLN
Freq: Once | INTRAVENOUS | Status: AC
  Administered 2015-11-22: 11:00:00 via INTRAVENOUS

## 2015-11-22 MED ORDER — DIPHENHYDRAMINE HCL 50 MG/ML IJ SOLN
INTRAMUSCULAR | Status: AC
  Filled 2015-11-22: qty 1

## 2015-11-22 NOTE — Telephone Encounter (Signed)
appt made and avs printed °

## 2015-11-22 NOTE — Assessment & Plan Note (Signed)
Left breast biopsy 08/17/2015:1:30 position: Invasive ductal carcinoma, grade 3, ER 0%, PR 0%, HER-2 negative ratio 1.48, Ki-67 90%, 3.1 cm tumor, axilla negative, T2 N0 stage II a clinical stage  Treatment plan: 1. Neoadjuvant chemotherapy with dose dense Adriamycin and Cytoxan 4 followed by Taxol and carboplatin weekly 12 2. Followed by surgery 3. Followed by adjuvant radiation Breast biopsies x 2: 09/28/2015 : PASH -------------------------------------------------------------------------------------------------------------------------------------------------- Current treatment: Completed Cycle 4 dose dense Adriamycin and Cytoxan on 10/31/15; Today's cycle 2 Taxol and carboplatin Echocardiogram 09/13/2015: EF 55-60% Labs were reviewed.  Normocytic anemia: work up revealed iron deficiency: Received IV iron treatment with chemotherapy Chemotoxicities: 1. Severe nausea and vomiting: We will administer IV Zofran today again because of nausea issues. 2. Hair loss 3. Conjunctivitis versus corneal abrasion: Resolved any great relief of collar and swelling in the urine electrolyte 90s with 2 refills( o'clock a.m. He is up he is same to use yes or things yes she is here okay okay and did notwith steroid eyedrops 4. Insomnia: I encouraged her to take 2 Ativan once at bedtime. 5. Fever up to 100F: It could be related to the very high white count but it could also be related to infection.  7. Dehydration: received IV fluids with normal saline along with Zofran. 8. Diarrhea: Improved with Imodium 9. Skin dryness leading to itching: I instructed her to use Vaseline on Aquaphor  Anemia:Hemoglobin electrophoresis 10/31/2015: Normal . Her daughter and her mother are all anemic. Her hemoglobin is 11 mg  Monitoring very closely for chemotherapy toxicities. Return to clinic as previously scheduled with cycle 3 of Taxol and carboplatin (lower dosage). I decreased the dosing of Taxol to 50  mg/m.  Return to clinic in 2 weeks for toxicity check and follow-up.

## 2015-11-22 NOTE — Progress Notes (Signed)
Patient Care Team: [name], MD as PCP - General (Internal Medicine)  SUMMARY OF ONCOLOGIC HISTORY:   Breast cancer of upper-outer quadrant of left female breast ([place])   08/17/2015 Initial Diagnosis left breast biopsy 1:30 position: Invasive ductal carcinoma, grade 3, ER 0%, PR 0%, HER-2 negative ratio 1.48, Ki-67 90%, 3.1 cm tumor, axilla negative, T2 N0 stage II a clinical stage   09/19/2015 -  Neo-Adjuvant Chemotherapy Neoadjuvant chemotherapy with dose dense Adriamycin and Cytoxan 4 followed by Taxol and carboplatin weekly 12   09/28/2015 Procedure  Left breast biopsy anterior third left upper outer quadrant: PASH;  upper inner quadrant: PASH    CHIEF COMPLIANT: Taxol and carboplatin week 2  INTERVAL HISTORY: Cindy Bradshaw is a 45 year old with above-mentioned history left breast cancer currently on neoadjuvant chemotherapy. She completed 4 cycles of dose dense mass and Cytoxan. Today is cycle 2 of Taxol carboplatin. After cycle 1 of Taxol and carboplatin she had nausea and came in on Thursday and Friday to receive IV fluids with Zofran. Since then she has been constipated. Prior to that she had diarrhea. And she was treated with Lomotil. Today she feels markedly better. Denies any nausea or vomiting. She has not had a bowel movement in 4 days.  REVIEW OF SYSTEMS:   Constitutional: Denies fevers, chills or abnormal weight loss, fatigue Eyes: Denies blurriness of vision Ears, nose, mouth, throat, and face: Denies mucositis or sore throat Respiratory: Denies cough, dyspnea or wheezes Cardiovascular: Denies palpitation, chest discomfort Gastrointestinal:  Previously diarrhea now constipation, nausea Skin: Denies abnormal skin rashes Lymphatics: Denies new lymphadenopathy or easy bruising Neurological:Denies numbness, tingling or new weaknesses Behavioral/Psych: Mood is stable, no new changes  Extremities: No lower extremity edema All other systems were reviewed  with the patient and are negative.  I have reviewed the past medical history, past surgical history, social history and family history with the patient and they are unchanged from previous note.  ALLERGIES:  is allergic to dilaudid; ivp dye; percocet; and toradol.  MEDICATIONS:  Current Outpatient Prescriptions  Medication Sig Dispense Refill  . diphenoxylate-atropine (LOMOTIL) 2.5-0.025 MG tablet Take 2 tablets by mouth 4 (four) times daily as needed for diarrhea or loose stools. 30 tablet 0  . lidocaine-prilocaine (EMLA) cream Apply to affected area as directed.  3  . LORazepam (ATIVAN) 0.5 MG tablet Take 0.5 mg by mouth at bedtime.  0  . ondansetron (ZOFRAN) 8 MG tablet Take 1 tablet (8 mg total) by mouth every 8 (eight) hours as needed for nausea or vomiting. 30 tablet 1  . PROAIR HFA 108 (90 BASE) MCG/ACT inhaler Inhale 2 puffs into the lungs every 4 (four) hours as needed. For shortness of breath  3  . prochlorperazine (COMPAZINE) 10 MG tablet Reported on 11/17/2015  1  . traMADol (ULTRAM) 50 MG tablet Take 1 tablet (50 mg total) by mouth every 6 (six) hours as needed for moderate pain or severe pain. 45 tablet 0   No current facility-administered medications for this visit.    PHYSICAL EXAMINATION: ECOG PERFORMANCE STATUS: 1 - Symptomatic but completely ambulatory  Filed Vitals:   11/22/15 1038  BP: 124/77  Pulse: 93  Temp: 98.1 F (36.7 C)  Resp: 19   Filed Weights   11/22/15 1038  Weight: 249 lb 12.8 oz (113.309 kg)    GENERAL:alert, no distress and comfortable SKIN: skin color, texture, turgor are normal, no rashes or significant lesions EYES: normal, Conjunctiva are pink and non-injected, sclera  clear OROPHARYNX:no exudate, no erythema and lips, buccal mucosa, and tongue normal  NECK: supple, thyroid normal size, non-tender, without nodularity LYMPH:  no palpable lymphadenopathy in the cervical, axillary or inguinal LUNGS: clear to auscultation and percussion with  normal breathing effort HEART: regular rate & rhythm and no murmurs and no lower extremity edema ABDOMEN:abdomen soft, non-tender and normal bowel sounds MUSCULOSKELETAL:no cyanosis of digits and no clubbing  NEURO: alert & oriented x 3 with fluent speech, no focal motor/sensory deficits EXTREMITIES: No lower extremity edema  LABORATORY DATA:  I have reviewed the data as listed   Chemistry      Component Value Date/Time   NA 141 11/17/2015 1340   NA 134* 09/16/2015 2316   K 3.7 11/17/2015 1340   K 3.4* 09/16/2015 2316   CL 102 09/16/2015 2316   CO2 25 11/17/2015 1340   CO2 24 09/16/2015 2316   BUN 15.2 11/17/2015 1340   BUN 12 09/16/2015 2316   CREATININE 0.7 11/17/2015 1340   CREATININE 0.64 09/16/2015 2316      Component Value Date/Time   CALCIUM 9.6 11/17/2015 1340   CALCIUM 9.6 09/16/2015 2316   ALKPHOS 60 11/17/2015 1340   ALKPHOS 61 09/16/2015 2316   AST 18 11/17/2015 1340   AST 22 09/16/2015 2316   ALT 27 11/17/2015 1340   ALT 33 09/16/2015 2316   BILITOT <0.30 11/17/2015 1340   BILITOT 0.5 09/16/2015 2316       Lab Results  Component Value Date   WBC 7.4 11/22/2015   HGB 10.9* 11/22/2015   HCT 33.5* 11/22/2015   MCV 86.8 11/22/2015   PLT 447* 11/22/2015   NEUTROABS 5.2 11/22/2015   ASSESSMENT & PLAN:  Breast cancer of upper-outer quadrant of left female breast ([place]) Left breast biopsy 08/17/2015:1:30 position: Invasive ductal carcinoma, grade 3, ER 0%, PR 0%, HER-2 negative ratio 1.48, Ki-67 90%, 3.1 cm tumor, axilla negative, T2 N0 stage II a clinical stage  Treatment plan: 1. Neoadjuvant chemotherapy with dose dense Adriamycin and Cytoxan 4 followed by Taxol and carboplatin weekly 12 2. Followed by surgery 3. Followed by adjuvant radiation Breast biopsies x 2: 09/28/2015 : PASH -------------------------------------------------------------------------------------------------------------------------------------------------- Current treatment:  Completed Cycle 4 dose dense Adriamycin and Cytoxan on 10/31/15; Today's cycle 2 Taxol and carboplatin Echocardiogram 09/13/2015: EF 55-60% Labs were reviewed.  Normocytic anemia: work up revealed iron deficiency: Received IV iron treatment with chemotherapy Chemotoxicities: 1. Severe nausea and vomiting: We will administer IV Zofran today again because of nausea issues. 2. Hair loss 3. Conjunctivitis versus corneal abrasion: Resolved any great relief of collar and swelling in the urine electrolyte 90s with 2 refills( o'clock a.m. He is up he is same to use yes or things yes she is here okay okay and did notwith steroid eyedrops 4. Insomnia: I encouraged her to take 2 Ativan once at bedtime. 5. Fever up to 100F: It could be related to the very high white count but it could also be related to infection.  7. Dehydration: received IV fluids with normal saline along with Zofran. 8. Diarrhea: Improved with Imodium 9. Skin dryness leading to itching: I instructed her to use Vaseline on Aquaphor 10. Diarrhea alternating with constipation  Anemia:Hemoglobin electrophoresis 10/31/2015: Normal . Her daughter and her mother are all anemic. Her hemoglobin is 11 mg  Monitoring very closely for chemotherapy toxicities. Return to clinic as previously scheduled with cycle 3 of Taxol and carboplatin (lower dosage). I decreased the dosing of Taxol to 50 mg/m.  Return  to clinic in 2 weeks for toxicity check and follow-up.   No orders of the defined types were placed in this encounter.   The patient has a good understanding of the overall plan. she agrees with it. she will call with any problems that may develop before the next visit here.   Rulon Eisenmenger, MD 11/22/2015

## 2015-11-22 NOTE — Telephone Encounter (Signed)
Per staff message and POF I have scheduled appts. Advised scheduler of appts. JMW  

## 2015-11-22 NOTE — Patient Instructions (Signed)
[facility] Discharge Instructions for Patients Receiving Chemotherapy  Today you received the following chemotherapy agents Taxol/ Carboplatin  To help prevent nausea and vomiting after your treatment, we encourage you to take your nausea medication as directed by your doctor.    If you develop nausea and vomiting that is not controlled by your nausea medication, call the clinic.   BELOW ARE SYMPTOMS THAT SHOULD BE REPORTED IMMEDIATELY:  *FEVER GREATER THAN 100.5 F  *CHILLS WITH OR WITHOUT FEVER  NAUSEA AND VOMITING THAT IS NOT CONTROLLED WITH YOUR NAUSEA MEDICATION  *UNUSUAL SHORTNESS OF BREATH  *UNUSUAL BRUISING OR BLEEDING  TENDERNESS IN MOUTH AND THROAT WITH OR WITHOUT PRESENCE OF ULCERS  *URINARY PROBLEMS  *BOWEL PROBLEMS  UNUSUAL RASH Items with * indicate a potential emergency and should be followed up as soon as possible.  Feel free to call the clinic you have any questions or concerns. The clinic phone number is [phone].  Please show the [facility] at check-in to the Emergency Department and triage nurse.  Paclitaxel injection Taxol What is this medicine? PACLITAXEL (PAK li TAX el) is a chemotherapy drug. It targets fast dividing cells, like cancer cells, and causes these cells to die. This medicine is used to treat ovarian cancer, breast cancer, and other cancers. This medicine may be used for other purposes; ask your health care provider or pharmacist if you have questions. What should I tell my health care provider before I take this medicine? They need to know if you have any of these conditions: -blood disorders -irregular heartbeat -infection (especially a virus infection such as chickenpox, cold sores, or herpes) -liver disease -previous or ongoing radiation therapy -an unusual or allergic reaction to paclitaxel, alcohol, polyoxyethylated castor oil, other chemotherapy agents, other medicines, foods, dyes, or  preservatives -pregnant or trying to get pregnant -breast-feeding How should I use this medicine? This drug is given as an infusion into a vein. It is administered in a hospital or clinic by a specially trained health care professional. Talk to your pediatrician regarding the use of this medicine in children. Special care may be needed. Overdosage: If you think you have taken too much of this medicine contact a poison control center or emergency room at once. NOTE: This medicine is only for you. Do not share this medicine with others. What if I miss a dose? It is important not to miss your dose. Call your doctor or health care professional if you are unable to keep an appointment. What may interact with this medicine? Do not take this medicine with any of the following medications: -disulfiram -metronidazole This medicine may also interact with the following medications: -cyclosporine -diazepam -ketoconazole -medicines to increase blood counts like filgrastim, pegfilgrastim, sargramostim -other chemotherapy drugs like cisplatin, doxorubicin, epirubicin, etoposide, teniposide, vincristine -quinidine -testosterone -vaccines -verapamil Talk to your doctor or health care professional before taking any of these medicines: -acetaminophen -aspirin -ibuprofen -ketoprofen -naproxen This list may not describe all possible interactions. Give your health care provider a list of all the medicines, herbs, non-prescription drugs, or dietary supplements you use. Also tell them if you smoke, drink alcohol, or use illegal drugs. Some items may interact with your medicine. What should I watch for while using this medicine? Your condition will be monitored carefully while you are receiving this medicine. You will need important blood work done while you are taking this medicine. This drug may make you feel generally unwell. This is not uncommon, as chemotherapy can affect  healthy cells as well as cancer  cells. Report any side effects. Continue your course of treatment even though you feel ill unless your doctor tells you to stop. This medicine can cause serious allergic reactions. To reduce your risk you will need to take other medicine(s) before treatment with this medicine. In some cases, you may be given additional medicines to help with side effects. Follow all directions for their use. Call your doctor or health care professional for advice if you get a fever, chills or sore throat, or other symptoms of a cold or flu. Do not treat yourself. This drug decreases your body's ability to fight infections. Try to avoid being around people who are sick. This medicine may increase your risk to bruise or bleed. Call your doctor or health care professional if you notice any unusual bleeding. Be careful brushing and flossing your teeth or using a toothpick because you may get an infection or bleed more easily. If you have any dental work done, tell your dentist you are receiving this medicine. Avoid taking products that contain aspirin, acetaminophen, ibuprofen, naproxen, or ketoprofen unless instructed by your doctor. These medicines may hide a fever. Do not become pregnant while taking this medicine. Women should inform their doctor if they wish to become pregnant or think they might be pregnant. There is a potential for serious side effects to an unborn child. Talk to your health care professional or pharmacist for more information. Do not breast-feed an infant while taking this medicine. Men are advised not to father a child while receiving this medicine. This product may contain alcohol. Ask your pharmacist or healthcare provider if this medicine contains alcohol. Be sure to tell all healthcare providers you are taking this medicine. Certain medicines, like metronidazole and disulfiram, can cause an unpleasant reaction when taken with alcohol. The reaction includes flushing, headache, nausea, vomiting,  sweating, and increased thirst. The reaction can last from 30 minutes to several hours. What side effects may I notice from receiving this medicine? Side effects that you should report to your doctor or health care professional as soon as possible: -allergic reactions like skin rash, itching or hives, swelling of the face, lips, or tongue -low blood counts - This drug may decrease the number of white blood cells, red blood cells and platelets. You may be at increased risk for infections and bleeding. -signs of infection - fever or chills, cough, sore throat, pain or difficulty passing urine -signs of decreased platelets or bleeding - bruising, pinpoint red spots on the skin, black, tarry stools, nosebleeds -signs of decreased red blood cells - unusually weak or tired, fainting spells, lightheadedness -breathing problems -chest pain -high or low blood pressure -mouth sores -nausea and vomiting -pain, swelling, redness or irritation at the injection site -pain, tingling, numbness in the hands or feet -slow or irregular heartbeat -swelling of the ankle, feet, hands Side effects that usually do not require medical attention (report to your doctor or health care professional if they continue or are bothersome): -bone pain -complete hair loss including hair on your head, underarms, pubic hair, eyebrows, and eyelashes -changes in the color of fingernails -diarrhea -loosening of the fingernails -loss of appetite -muscle or joint pain -red flush to skin -sweating This list may not describe all possible side effects. Call your doctor for medical advice about side effects. You may report side effects to FDA at 1-800-FDA-1088. Where should I keep my medicine? This drug is given in a hospital or clinic and will  not be stored at home. NOTE: This sheet is a summary. It may not cover all possible information. If you have questions about this medicine, talk to your doctor, pharmacist, or health care  provider.    2016, [employer]. (2015-04-07 13:02:56)   Carboplatin injection What is this medicine? CARBOPLATIN (KAR boe pla tin) is a chemotherapy drug. It targets fast dividing cells, like cancer cells, and causes these cells to die. This medicine is used to treat ovarian cancer and many other cancers. This medicine may be used for other purposes; ask your health care provider or pharmacist if you have questions. What should I tell my health care provider before I take this medicine? They need to know if you have any of these conditions: -blood disorders -hearing problems -kidney disease -recent or ongoing radiation therapy -an unusual or allergic reaction to carboplatin, cisplatin, other chemotherapy, other medicines, foods, dyes, or preservatives -pregnant or trying to get pregnant -breast-feeding How should I use this medicine? This drug is usually given as an infusion into a vein. It is administered in a hospital or clinic by a specially trained health care professional. Talk to your pediatrician regarding the use of this medicine in children. Special care may be needed. Overdosage: If you think you have taken too much of this medicine contact a poison control center or emergency room at once. NOTE: This medicine is only for you. Do not share this medicine with others. What if I miss a dose? It is important not to miss a dose. Call your doctor or health care professional if you are unable to keep an appointment. What may interact with this medicine? -medicines for seizures -medicines to increase blood counts like filgrastim, pegfilgrastim, sargramostim -some antibiotics like amikacin, gentamicin, neomycin, streptomycin, tobramycin -vaccines Talk to your doctor or health care professional before taking any of these medicines: -acetaminophen -aspirin -ibuprofen -ketoprofen -naproxen This list may not describe all possible interactions. Give your health care  provider a list of all the medicines, herbs, non-prescription drugs, or dietary supplements you use. Also tell them if you smoke, drink alcohol, or use illegal drugs. Some items may interact with your medicine. What should I watch for while using this medicine? Your condition will be monitored carefully while you are receiving this medicine. You will need important blood work done while you are taking this medicine. This drug may make you feel generally unwell. This is not uncommon, as chemotherapy can affect healthy cells as well as cancer cells. Report any side effects. Continue your course of treatment even though you feel ill unless your doctor tells you to stop. In some cases, you may be given additional medicines to help with side effects. Follow all directions for their use. Call your doctor or health care professional for advice if you get a fever, chills or sore throat, or other symptoms of a cold or flu. Do not treat yourself. This drug decreases your body's ability to fight infections. Try to avoid being around people who are sick. This medicine may increase your risk to bruise or bleed. Call your doctor or health care professional if you notice any unusual bleeding. Be careful brushing and flossing your teeth or using a toothpick because you may get an infection or bleed more easily. If you have any dental work done, tell your dentist you are receiving this medicine. Avoid taking products that contain aspirin, acetaminophen, ibuprofen, naproxen, or ketoprofen unless instructed by your doctor. These medicines may hide a fever. Do not become  pregnant while taking this medicine. Women should inform their doctor if they wish to become pregnant or think they might be pregnant. There is a potential for serious side effects to an unborn child. Talk to your health care professional or pharmacist for more information. Do not breast-feed an infant while taking this medicine. What side effects may I  notice from receiving this medicine? Side effects that you should report to your doctor or health care professional as soon as possible: -allergic reactions like skin rash, itching or hives, swelling of the face, lips, or tongue -signs of infection - fever or chills, cough, sore throat, pain or difficulty passing urine -signs of decreased platelets or bleeding - bruising, pinpoint red spots on the skin, black, tarry stools, nosebleeds -signs of decreased red blood cells - unusually weak or tired, fainting spells, lightheadedness -breathing problems -changes in hearing -changes in vision -chest pain -high blood pressure -low blood counts - This drug may decrease the number of white blood cells, red blood cells and platelets. You may be at increased risk for infections and bleeding. -nausea and vomiting -pain, swelling, redness or irritation at the injection site -pain, tingling, numbness in the hands or feet -problems with balance, talking, walking -trouble passing urine or change in the amount of urine Side effects that usually do not require medical attention (report to your doctor or health care professional if they continue or are bothersome): -hair loss -loss of appetite -metallic taste in the mouth or changes in taste This list may not describe all possible side effects. Call your doctor for medical advice about side effects. You may report side effects to FDA at 1-800-FDA-1088. Where should I keep my medicine? This drug is given in a hospital or clinic and will not be stored at home. NOTE: This sheet is a summary. It may not cover all possible information. If you have questions about this medicine, talk to your doctor, pharmacist, or health care provider.    2016, [employer] Standard. (2007-11-25 14:38:05)

## 2015-11-23 ENCOUNTER — Other Ambulatory Visit: Payer: Self-pay | Admitting: *Deleted

## 2015-11-23 ENCOUNTER — Telehealth: Payer: Self-pay | Admitting: Hematology and Oncology

## 2015-11-23 NOTE — Telephone Encounter (Signed)
Added rx appt per 3/22 pof. Pt will get updated sch today at visit

## 2015-11-24 ENCOUNTER — Other Ambulatory Visit: Payer: Self-pay

## 2015-11-24 ENCOUNTER — Ambulatory Visit (HOSPITAL_BASED_OUTPATIENT_CLINIC_OR_DEPARTMENT_OTHER): Payer: Federal, State, Local not specified - PPO

## 2015-11-24 VITALS — BP 129/80 | HR 91 | Temp 98.9°F | Resp 16

## 2015-11-24 DIAGNOSIS — R112 Nausea with vomiting, unspecified: Secondary | ICD-10-CM

## 2015-11-24 DIAGNOSIS — K6289 Other specified diseases of anus and rectum: Secondary | ICD-10-CM | POA: Diagnosis not present

## 2015-11-24 DIAGNOSIS — C50412 Malignant neoplasm of upper-outer quadrant of left female breast: Secondary | ICD-10-CM | POA: Diagnosis not present

## 2015-11-24 MED ORDER — HEPARIN SOD (PORK) LOCK FLUSH 100 UNIT/ML IV SOLN
500.0000 [IU] | INTRAVENOUS | Status: AC | PRN
  Administered 2015-11-24: 500 [IU]
  Filled 2015-11-24: qty 5

## 2015-11-24 MED ORDER — MORPHINE SULFATE 4 MG/ML IJ SOLN
2.0000 mg | Freq: Once | INTRAMUSCULAR | Status: AC
  Administered 2015-11-24: 2 mg via INTRAVENOUS
  Filled 2015-11-24: qty 1

## 2015-11-24 MED ORDER — SODIUM CHLORIDE 0.9 % IV SOLN
4.0000 mg | Freq: Once | INTRAVENOUS | Status: AC
  Administered 2015-11-24: 4 mg via INTRAVENOUS
  Filled 2015-11-24: qty 0.4

## 2015-11-24 MED ORDER — SODIUM CHLORIDE 0.9 % IV SOLN
Freq: Once | INTRAVENOUS | Status: AC
  Administered 2015-11-24: 15:00:00 via INTRAVENOUS

## 2015-11-24 MED ORDER — MORPHINE SULFATE (PF) 4 MG/ML IV SOLN
INTRAVENOUS | Status: AC
  Filled 2015-11-24: qty 1

## 2015-11-24 MED ORDER — SODIUM CHLORIDE 0.9% FLUSH
10.0000 mL | INTRAVENOUS | Status: AC | PRN
  Administered 2015-11-24: 10 mL
  Filled 2015-11-24: qty 10

## 2015-11-24 MED ORDER — SODIUM CHLORIDE 0.9 % IV SOLN: Freq: Once | INTRAVENOUS | Status: DC

## 2015-11-24 MED ORDER — PROMETHAZINE HCL 25 MG/ML IJ SOLN
12.5000 mg | Freq: Once | INTRAMUSCULAR | Status: AC
  Administered 2015-11-24: 12.5 mg via INTRAVENOUS
  Filled 2015-11-24: qty 1

## 2015-11-24 NOTE — Patient Instructions (Signed)
Abdominal Pain, Adult Many things can cause abdominal pain. Usually, abdominal pain is not caused by a disease and will improve without treatment. It can often be observed and treated at home. Your health care provider will do a physical exam and possibly order blood tests and X-rays to help determine the seriousness of your pain. However, in many cases, more time must pass before a clear cause of the pain can be found. Before that point, your health care provider may not know if you need more testing or further treatment. HOME CARE INSTRUCTIONS Monitor your abdominal pain for any changes. The following actions may help to alleviate any discomfort you are experiencing:  Only take over-the-counter or prescription medicines as directed by your health care provider.  Do not take laxatives unless directed to do so by your health care provider.  Try a clear liquid diet (broth, tea, or water) as directed by your health care provider. Slowly move to a bland diet as tolerated. SEEK MEDICAL CARE IF:  You have unexplained abdominal pain.  You have abdominal pain associated with nausea or diarrhea.  You have pain when you urinate or have a bowel movement.  You experience abdominal pain that wakes you in the night.  You have abdominal pain that is worsened or improved by eating food.  You have abdominal pain that is worsened with eating fatty foods.  You have a fever. SEEK IMMEDIATE MEDICAL CARE IF:  Your pain does not go away within 2 hours.  You keep throwing up (vomiting).  Your pain is felt only in portions of the abdomen, such as the right side or the left lower portion of the abdomen.  You pass bloody or black tarry stools. MAKE SURE YOU:  Understand these instructions.  Will watch your condition.  Will get help right away if you are not doing well or get worse.   This information is not intended to replace advice given to you by your health care provider. Make sure you discuss  any questions you have with your health care provider.   Document Released: 05/30/2005 Document Revised: 05/11/2015 Document Reviewed: 04/29/2013 Elsevier Interactive Patient Education 2016 Elsevier Inc[employer] Nausea, Adult Nausea is the feeling that you have an upset stomach or have to vomit. Nausea by itself is not likely a serious concern, but it may be an early sign of more serious medical problems. As nausea gets worse, it can lead to vomiting. If vomiting develops, there is the risk of dehydration.  CAUSES   Viral infections.  Food poisoning.  Medicines.  Pregnancy.  Motion sickness.  Migraine headaches.  Emotional distress.  Severe pain from any source.  Alcohol intoxication. HOME CARE INSTRUCTIONS  Get plenty of rest.  Ask your caregiver about specific rehydration instructions.  Eat small amounts of food and sip liquids more often.  Take all medicines as told by your caregiver. SEEK MEDICAL CARE IF:  You have not improved after 2 days, or you get worse.  You have a headache. SEEK IMMEDIATE MEDICAL CARE IF:   You have a fever.  You faint.  You keep vomiting or have blood in your vomit.  You are extremely weak or dehydrated.  You have dark or bloody stools.  You have severe chest or abdominal pain. MAKE SURE YOU:  Understand these instructions.  Will watch your condition.  Will get help right away if you are not doing well or get worse.   This information is not intended to replace advice given to you  by your health care provider. Make sure you discuss any questions you have with your health care provider.   Document Released: 09/27/2004 Document Revised: 09/10/2014 Document Reviewed: 05/02/2011 Elsevier Interactive Patient Education 2016 Elsevier Inc. Dehydration, Adult Dehydration is a condition in which you do not have enough fluid or water in your body. It happens when you take in less fluid than you lose. Vital organs such as the kidneys, brain,  and heart cannot function without a proper amount of fluids. Any loss of fluids from the body can cause dehydration.  Dehydration can range from mild to severe. This condition should be treated right away to help prevent it from becoming severe. CAUSES  This condition may be caused by:  Vomiting.  Diarrhea.  Excessive sweating, such as when exercising in hot or humid weather.  Not drinking enough fluid during strenuous exercise or during an illness.  Excessive urine output.  Fever.  Certain medicines. RISK FACTORS This condition is more likely to develop in:  People who are taking certain medicines that cause the body to lose excess fluid (diuretics).   People who have a chronic illness, such as diabetes, that may increase urination.  Older adults.   People who live at high altitudes.   People who participate in endurance sports.  SYMPTOMS  Mild Dehydration  Thirst.  Dry lips.  Slightly dry mouth.  Dry, warm skin. Moderate Dehydration  Very dry mouth.   Muscle cramps.   Dark urine and decreased urine production.   Decreased tear production.   Headache.   Light-headedness, especially when you stand up from a sitting position.  Severe Dehydration  Changes in skin.   Cold and clammy skin.   Skin does not spring back quickly when lightly pinched and released.   Changes in body fluids.   Extreme thirst.   No tears.   Not able to sweat when body temperature is high, such as in hot weather.   Minimal urine production.   Changes in vital signs.   Rapid, weak pulse (more than 100 beats per minute when you are sitting still).   Rapid breathing.   Low blood pressure.   Other changes.   Sunken eyes.   Cold hands and feet.   Confusion.  Lethargy and difficulty being awakened.  Fainting (syncope).   Short-term weight loss.   Unconsciousness. DIAGNOSIS  This condition may be diagnosed based on your symptoms.  You may also have tests to determine how severe your dehydration is. These tests may include:   Urine tests.   Blood tests.  TREATMENT  Treatment for this condition depends on the severity. Mild or moderate dehydration can often be treated at home. Treatment should be started right away. Do not wait until dehydration becomes severe. Severe dehydration needs to be treated at the hospital. Treatment for Mild Dehydration  Drinking plenty of water to replace the fluid you have lost.   Replacing minerals in your blood (electrolytes) that you may have lost.  Treatment for Moderate Dehydration  Consuming oral rehydration solution (ORS). Treatment for Severe Dehydration  Receiving fluid through an IV tube.   Receiving electrolyte solution through a feeding tube that is passed through your nose and into your stomach (nasogastric tube or NG tube).  Correcting any abnormalities in electrolytes. HOME CARE INSTRUCTIONS   Drink enough fluid to keep your urine clear or pale yellow.   Drink water or fluid slowly by taking small sips. You can also try sucking on ice cubes.  Have  food or beverages that contain electrolytes. Examples include bananas and sports drinks.  Take over-the-counter and prescription medicines only as told by your health care provider.   Prepare ORS according to the manufacturer's instructions. Take sips of ORS every 5 minutes until your urine returns to normal.  If you have vomiting or diarrhea, continue to try to drink water, ORS, or both.   If you have diarrhea, avoid:   Beverages that contain caffeine.   Fruit juice.   Milk.   Carbonated soft drinks.  Do not take salt tablets. This can lead to the condition of having too much sodium in your body (hypernatremia).  SEEK MEDICAL CARE IF:  You cannot eat or drink without vomiting.  You have had moderate diarrhea during a period of more than 24 hours.  You have a fever. SEEK IMMEDIATE MEDICAL  CARE IF:   You have extreme thirst.  You have severe diarrhea.  You have not urinated in 6-8 hours, or you have urinated only a small amount of very dark urine.  You have shriveled skin.  You are dizzy, confused, or both.   This information is not intended to replace advice given to you by your health care provider. Make sure you discuss any questions you have with your health care provider.   Document Released: 08/20/2005 Document Revised: 05/11/2015 Document Reviewed: 01/05/2015 Elsevier Interactive Patient Education [employer].

## 2015-11-24 NOTE — Progress Notes (Signed)
Called Cindy Bradshaw (Dr. Geralyn Flash RN) regarding patient's c/o pain and rectal bleeding. Cindy Bradshaw relayed information to Dr. Lindi Adie and orders received. Patient medicated per orders.  At time of discharge, patient states her pain and nausea are completely resolved.

## 2015-11-28 ENCOUNTER — Ambulatory Visit: Payer: Self-pay

## 2015-11-29 ENCOUNTER — Telehealth: Payer: Self-pay

## 2015-11-29 ENCOUNTER — Other Ambulatory Visit: Payer: Self-pay

## 2015-11-29 ENCOUNTER — Ambulatory Visit: Payer: Self-pay

## 2015-11-29 NOTE — Telephone Encounter (Signed)
1st two calls - unanswered.  3rd attempt - LMOVM pt to return call to clinic for missed appt.

## 2015-11-30 ENCOUNTER — Telehealth: Payer: Self-pay | Admitting: *Deleted

## 2015-11-30 NOTE — Telephone Encounter (Signed)
LMOVM to call clinic back to confirm appts tomorrow; Offered patient the option to come in tomorrow for 1pm labs so she can get chemo at 2pm or to keep IVF appt. Left personal extension for callback.

## 2015-11-30 NOTE — Telephone Encounter (Signed)
Left message for a return phone call about her missed chemo.

## 2015-12-01 ENCOUNTER — Telehealth: Payer: Self-pay | Admitting: *Deleted

## 2015-12-01 ENCOUNTER — Ambulatory Visit: Payer: Self-pay

## 2015-12-01 NOTE — Telephone Encounter (Signed)
Received call back from patient stating she has been on vacation this week and she cancelled her appointments for this week and thought everyone knew this.  She is aware of her appointments for next week.

## 2015-12-04 NOTE — Assessment & Plan Note (Signed)
Left breast biopsy 08/17/2015:1:30 position: Invasive ductal carcinoma, grade 3, ER 0%, PR 0%, HER-2 negative ratio 1.48, Ki-67 90%, 3.1 cm tumor, axilla negative, T2 N0 stage II a clinical stage  Treatment plan: 1. Neoadjuvant chemotherapy with dose dense Adriamycin and Cytoxan 4 followed by Taxol and carboplatin weekly 12 2. Followed by surgery 3. Followed by adjuvant radiation Breast biopsies x 2: 09/28/2015 : PASH -------------------------------------------------------------------------------------------------------------------------------------------------- Current treatment: Completed Cycle 4 dose dense Adriamycin and Cytoxan on 10/31/15; Today's cycle 4 Taxol and carboplatin Echocardiogram 09/13/2015: EF 55-60% Labs were reviewed.  Normocytic anemia: work up revealed iron deficiency: Received IV iron treatment with chemotherapy Chemotoxicities: 1. Severe nausea and vomiting: We will administer IV Zofran today again because of nausea issues. 2. Hair loss 3. Conjunctivitis versus corneal abrasion: Resolved any great relief of collar and swelling in the urine electrolyte 90s with 2 refills( o'clock a.m. He is up he is same to use yes or things yes she is here okay okay and did notwith steroid eyedrops 4. Insomnia: I encouraged her to take 2 Ativan once at bedtime. 5. Fever up to 100F: It could be related to the very high white count but it could also be related to infection.  7. Dehydration: received IV fluids with normal saline along with Zofran. 8. Diarrhea: Improved with Imodium 9. Skin dryness leading to itching: I instructed her to use Vaseline on Aquaphor 10. Diarrhea alternating with constipation  Anemia:Hemoglobin electrophoresis 10/31/2015: Normal . Her daughter and her mother are all anemic. Her hemoglobin is 11 mg  Monitoring very closely for chemotherapy toxicities. Return to clinic as previously scheduled with cycle 3 of Taxol and carboplatin (lower dosage). I  decreased the dosing of Taxol to 50 mg/m.  Return to clinic in 2 weeks for toxicity check and follow-up.

## 2015-12-05 ENCOUNTER — Ambulatory Visit (HOSPITAL_BASED_OUTPATIENT_CLINIC_OR_DEPARTMENT_OTHER): Payer: Federal, State, Local not specified - PPO | Admitting: Hematology and Oncology

## 2015-12-05 ENCOUNTER — Ambulatory Visit (HOSPITAL_BASED_OUTPATIENT_CLINIC_OR_DEPARTMENT_OTHER): Payer: Federal, State, Local not specified - PPO

## 2015-12-05 ENCOUNTER — Telehealth: Payer: Self-pay | Admitting: Hematology and Oncology

## 2015-12-05 ENCOUNTER — Encounter: Payer: Self-pay | Admitting: Hematology and Oncology

## 2015-12-05 ENCOUNTER — Other Ambulatory Visit (HOSPITAL_BASED_OUTPATIENT_CLINIC_OR_DEPARTMENT_OTHER): Payer: Federal, State, Local not specified - PPO

## 2015-12-05 VITALS — BP 135/77 | HR 100 | Temp 98.4°F | Resp 18 | Ht 65.0 in | Wt 251.1 lb

## 2015-12-05 DIAGNOSIS — R5383 Other fatigue: Secondary | ICD-10-CM | POA: Diagnosis not present

## 2015-12-05 DIAGNOSIS — C50412 Malignant neoplasm of upper-outer quadrant of left female breast: Secondary | ICD-10-CM | POA: Diagnosis not present

## 2015-12-05 DIAGNOSIS — R112 Nausea with vomiting, unspecified: Secondary | ICD-10-CM

## 2015-12-05 DIAGNOSIS — D509 Iron deficiency anemia, unspecified: Secondary | ICD-10-CM | POA: Diagnosis not present

## 2015-12-05 DIAGNOSIS — Z5111 Encounter for antineoplastic chemotherapy: Secondary | ICD-10-CM | POA: Diagnosis not present

## 2015-12-05 DIAGNOSIS — G620 Drug-induced polyneuropathy: Secondary | ICD-10-CM | POA: Insufficient documentation

## 2015-12-05 DIAGNOSIS — T451X5A Adverse effect of antineoplastic and immunosuppressive drugs, initial encounter: Secondary | ICD-10-CM

## 2015-12-05 LAB — COMPREHENSIVE METABOLIC PANEL
ALT: 65 U/L — AB (ref 0–55)
AST: 43 U/L — AB (ref 5–34)
Albumin: 3.4 g/dL — ABNORMAL LOW (ref 3.5–5.0)
Alkaline Phosphatase: 55 U/L (ref 40–150)
Anion Gap: 6 mEq/L (ref 3–11)
BUN: 10.9 mg/dL (ref 7.0–26.0)
CO2: 26 meq/L (ref 22–29)
CREATININE: 0.8 mg/dL (ref 0.6–1.1)
Calcium: 10.1 mg/dL (ref 8.4–10.4)
Chloride: 109 mEq/L (ref 98–109)
EGFR: >90 mL/min/{1.73_m2} (ref >90)
GLUCOSE: 111 mg/dL (ref 70–140)
Potassium: 3.6 mEq/L (ref 3.5–5.1)
SODIUM: 141 meq/L (ref 136–145)
TOTAL PROTEIN: 7 g/dL (ref 6.4–8.3)

## 2015-12-05 LAB — CBC WITH DIFFERENTIAL/PLATELET
BASO%: 1 % (ref 0.0–2.0)
Basophils Absolute: 0 10*3/uL (ref 0.0–0.1)
EOS%: 2.9 % (ref 0.0–7.0)
Eosinophils Absolute: 0.1 10*3/uL (ref 0.0–0.5)
HCT: 34.2 % — ABNORMAL LOW (ref 34.8–46.6)
HGB: 11.1 g/dL — ABNORMAL LOW (ref 11.6–15.9)
LYMPH%: 24.6 % (ref 14.0–49.7)
MCH: 28.5 pg (ref 25.1–34.0)
MCHC: 32.4 g/dL (ref 31.5–36.0)
MCV: 88 fL (ref 79.5–101.0)
MONO#: 0.7 10*3/uL (ref 0.1–0.9)
MONO%: 13.9 % (ref 0.0–14.0)
NEUT%: 57.6 % (ref 38.4–76.8)
NEUTROS ABS: 2.8 10*3/uL (ref 1.5–6.5)
PLATELETS: 370 10*3/uL (ref 145–400)
RBC: 3.89 10*6/uL (ref 3.70–5.45)
RDW: 23.3 % — ABNORMAL HIGH (ref 11.2–14.5)
WBC: 4.9 10*3/uL (ref 3.9–10.3)
lymph#: 1.2 10*3/uL (ref 0.9–3.3)

## 2015-12-05 MED ORDER — DIPHENHYDRAMINE HCL 50 MG/ML IJ SOLN
25.0000 mg | Freq: Once | INTRAMUSCULAR | Status: AC
  Administered 2015-12-05: 25 mg via INTRAVENOUS

## 2015-12-05 MED ORDER — DIPHENHYDRAMINE HCL 50 MG/ML IJ SOLN
INTRAMUSCULAR | Status: AC
Start: 2015-12-05 — End: 2015-12-05
  Filled 2015-12-05: qty 1

## 2015-12-05 MED ORDER — FAMOTIDINE IN NACL 20-0.9 MG/50ML-% IV SOLN
20.0000 mg | Freq: Once | INTRAVENOUS | Status: AC
  Administered 2015-12-05: 20 mg via INTRAVENOUS

## 2015-12-05 MED ORDER — SODIUM CHLORIDE 0.9 % IV SOLN
Freq: Once | INTRAVENOUS | Status: AC
  Administered 2015-12-05: 15:00:00 via INTRAVENOUS

## 2015-12-05 MED ORDER — PALONOSETRON HCL INJECTION 0.25 MG/5ML
0.2500 mg | Freq: Once | INTRAVENOUS | Status: AC
  Administered 2015-12-05: 0.25 mg via INTRAVENOUS

## 2015-12-05 MED ORDER — FAMOTIDINE IN NACL 20-0.9 MG/50ML-% IV SOLN
INTRAVENOUS | Status: AC
  Filled 2015-12-05: qty 50

## 2015-12-05 MED ORDER — SODIUM CHLORIDE 0.9 % IV SOLN
50.0000 mg/m2 | Freq: Once | INTRAVENOUS | Status: AC
  Administered 2015-12-05: 114 mg via INTRAVENOUS
  Filled 2015-12-05: qty 19

## 2015-12-05 MED ORDER — DIPHENHYDRAMINE HCL 25 MG PO CAPS
ORAL_CAPSULE | ORAL | Status: AC
  Filled 2015-12-05: qty 1

## 2015-12-05 MED ORDER — SODIUM CHLORIDE 0.9 % IV SOLN
300.0000 mg | Freq: Once | INTRAVENOUS | Status: AC
  Administered 2015-12-05: 300 mg via INTRAVENOUS
  Filled 2015-12-05: qty 30

## 2015-12-05 MED ORDER — SODIUM CHLORIDE 0.9 % IJ SOLN
10.0000 mL | INTRAMUSCULAR | Status: DC | PRN
  Administered 2015-12-05: 10 mL
  Filled 2015-12-05: qty 10

## 2015-12-05 MED ORDER — SODIUM CHLORIDE 0.9 % IV SOLN
Freq: Once | INTRAVENOUS | Status: AC
  Administered 2015-12-05: 15:00:00 via INTRAVENOUS
  Filled 2015-12-05: qty 1

## 2015-12-05 MED ORDER — HEPARIN SOD (PORK) LOCK FLUSH 100 UNIT/ML IV SOLN
500.0000 [IU] | Freq: Once | INTRAVENOUS | Status: AC | PRN
  Administered 2015-12-05: 500 [IU]
  Filled 2015-12-05: qty 5

## 2015-12-05 MED ORDER — PALONOSETRON HCL INJECTION 0.25 MG/5ML
INTRAVENOUS | Status: AC
  Filled 2015-12-05: qty 5

## 2015-12-05 NOTE — Patient Instructions (Signed)
[facility] Discharge Instructions for Patients Receiving Chemotherapy  Today you received the following chemotherapy agents Taxol and Carboplatin.  To help prevent nausea and vomiting after your treatment, we encourage you to take your nausea medication as prescribed.   If you develop nausea and vomiting that is not controlled by your nausea medication, call the clinic.   BELOW ARE SYMPTOMS THAT SHOULD BE REPORTED IMMEDIATELY:  *FEVER GREATER THAN 100.5 F  *CHILLS WITH OR WITHOUT FEVER  NAUSEA AND VOMITING THAT IS NOT CONTROLLED WITH YOUR NAUSEA MEDICATION  *UNUSUAL SHORTNESS OF BREATH  *UNUSUAL BRUISING OR BLEEDING  TENDERNESS IN MOUTH AND THROAT WITH OR WITHOUT PRESENCE OF ULCERS  *URINARY PROBLEMS  *BOWEL PROBLEMS  UNUSUAL RASH Items with * indicate a potential emergency and should be followed up as soon as possible.  Feel free to call the clinic you have any questions or concerns. The clinic phone number is [phone].  Please show the CHEMO ALERT CARD at check-in to the Emergency Department and triage nurse.   

## 2015-12-05 NOTE — Progress Notes (Signed)
Unable to get in to exam room prior to MD.  No assessment performed.  

## 2015-12-05 NOTE — Telephone Encounter (Signed)
appt made and avs printed °

## 2015-12-05 NOTE — Progress Notes (Signed)
Patient Care Team: [name], MD as PCP - General (Internal Medicine)   SUMMARY OF ONCOLOGIC HISTORY:   Breast cancer of upper-outer quadrant of left female breast ([place])   08/17/2015 Initial Diagnosis left breast biopsy 1:30 position: Invasive ductal carcinoma, grade 3, ER 0%, PR 0%, HER-2 negative ratio 1.48, Ki-67 90%, 3.1 cm tumor, axilla negative, T2 N0 stage II a clinical stage   09/19/2015 -  Neo-Adjuvant Chemotherapy Neoadjuvant chemotherapy with dose dense Adriamycin and Cytoxan 4 followed by Taxol and carboplatin weekly 12   09/28/2015 Procedure  Left breast biopsy anterior third left upper outer quadrant: PASH;  upper inner quadrant: PASH    CHIEF COMPLIANT: cycle 3 Taxol and carboplatin  INTERVAL HISTORY: Cindy Bradshaw is a 45 year old with above-mentioned history of triple negative left breast cancer currently on neoadjuvant chemotherapy with weekly Taxol and carboplatin. Her diarrhea has resolved. She is no longer using antidiarrheal medications. Her nausea has also improved. This is after restarted on Aloxi. She has not taken any antinausea medications. She still feels mild to moderate degree of fatigue.  REVIEW OF SYSTEMS:   Constitutional: Denies fevers, chills or abnormal weight loss Eyes: Denies blurriness of vision Ears, nose, mouth, throat, and face: Denies mucositis or sore throat Respiratory: Denies cough, dyspnea or wheezes Cardiovascular: Denies palpitation, chest discomfort Gastrointestinal:  Denies nausea, heartburn or change in bowel habits Skin: Denies abnormal skin rashes Lymphatics: Denies new lymphadenopathy or easy bruising Neurological:numbness in the fingers and toes, swelling of the fingers and toes. Behavioral/Psych: Mood is stable, no new changes  Extremities: No lower extremity edema  All other systems were reviewed with the patient and are negative.  I have reviewed the past medical history, past surgical history, social  history and family history with the patient and they are unchanged from previous note.  ALLERGIES:  is allergic to dilaudid; ivp dye; percocet; and toradol.  MEDICATIONS:  Current Outpatient Prescriptions  Medication Sig Dispense Refill  . diphenoxylate-atropine (LOMOTIL) 2.5-0.025 MG tablet Take 2 tablets by mouth 4 (four) times daily as needed for diarrhea or loose stools. 30 tablet 0  . lidocaine-prilocaine (EMLA) cream Apply to affected area as directed.  3  . LORazepam (ATIVAN) 0.5 MG tablet Take 0.5 mg by mouth at bedtime.  0  . ondansetron (ZOFRAN) 8 MG tablet Take 1 tablet (8 mg total) by mouth every 8 (eight) hours as needed for nausea or vomiting. 30 tablet 1  . PROAIR HFA 108 (90 BASE) MCG/ACT inhaler Inhale 2 puffs into the lungs every 4 (four) hours as needed. For shortness of breath  3  . prochlorperazine (COMPAZINE) 10 MG tablet Reported on 11/17/2015  1  . traMADol (ULTRAM) 50 MG tablet Take 1 tablet (50 mg total) by mouth every 6 (six) hours as needed for moderate pain or severe pain. 45 tablet 0   No current facility-administered medications for this visit.    PHYSICAL EXAMINATION: ECOG PERFORMANCE STATUS: 1 - Symptomatic but completely ambulatory  Filed Vitals:   12/05/15 1406  BP: 135/77  Pulse: 100  Temp: 98.4 F (36.9 C)  Resp: 18   Filed Weights   12/05/15 1406  Weight: 251 lb 1.6 oz (113.898 kg)    GENERAL:alert, no distress and comfortable SKIN: skin color, texture, turgor are normal, no rashes or significant lesions EYES: normal, Conjunctiva are pink and non-injected, sclera clear OROPHARYNX:no exudate, no erythema and lips, buccal mucosa, and tongue normal  NECK: supple, thyroid normal size, non-tender, without nodularity LYMPH:  no palpable lymphadenopathy in the cervical, axillary or inguinal LUNGS: clear to auscultation and percussion with normal breathing effort HEART: regular rate & rhythm and no murmurs and no lower extremity  edema ABDOMEN:abdomen soft, non-tender and normal bowel sounds MUSCULOSKELETAL:no cyanosis of digits and no clubbing  NEURO: alert & oriented x 3 with fluent speech, grade 1 peripheral neuropathy EXTREMITIES: No lower extremity edema  LABORATORY DATA:  I have reviewed the data as listed   Chemistry      Component Value Date/Time   NA 140 11/22/2015 1025   NA 134* 09/16/2015 2316   K 3.8 11/22/2015 1025   K 3.4* 09/16/2015 2316   CL 102 09/16/2015 2316   CO2 25 11/22/2015 1025   CO2 24 09/16/2015 2316   BUN 12.9 11/22/2015 1025   BUN 12 09/16/2015 2316   CREATININE 0.7 11/22/2015 1025   CREATININE 0.64 09/16/2015 2316      Component Value Date/Time   CALCIUM 9.7 11/22/2015 1025   CALCIUM 9.6 09/16/2015 2316   ALKPHOS 56 11/22/2015 1025   ALKPHOS 61 09/16/2015 2316   AST 41* 11/22/2015 1025   AST 22 09/16/2015 2316   ALT 73* 11/22/2015 1025   ALT 33 09/16/2015 2316   BILITOT <0.30 11/22/2015 1025   BILITOT 0.5 09/16/2015 2316       Lab Results  Component Value Date   WBC 4.9 12/05/2015   HGB 11.1* 12/05/2015   HCT 34.2* 12/05/2015   MCV 88.0 12/05/2015   PLT 370 12/05/2015   NEUTROABS 2.8 12/05/2015     ASSESSMENT & PLAN:  Breast cancer of upper-outer quadrant of left female breast ([place]) Left breast biopsy 08/17/2015:1:30 position: Invasive ductal carcinoma, grade 3, ER 0%, PR 0%, HER-2 negative ratio 1.48, Ki-67 90%, 3.1 cm tumor, axilla negative, T2 N0 stage II a clinical stage  Treatment plan: 1. Neoadjuvant chemotherapy with dose dense Adriamycin and Cytoxan 4 followed by Taxol and carboplatin weekly 12 2. Followed by surgery 3. Followed by adjuvant radiation Breast biopsies x 2: 09/28/2015 : PASH -------------------------------------------------------------------------------------------------------------------------------------------------- Current treatment: Completed Cycle 4 dose dense Adriamycin and Cytoxan on 10/31/15; Today's cycle 3 Taxol and  carboplatin Echocardiogram 09/13/2015: EF 55-60% Labs were reviewed.  Normocytic anemia: work up revealed iron deficiency: Received IV iron treatment with chemotherapy Chemotoxicities: 1. Severe nausea and vomiting: We will administer IV Zofran today again because of nausea issues. 2. Hair loss 3. Conjunctivitis versus corneal abrasion: Resolved any great relief of collar and swelling in the urine electrolyte 90s with 2 refills( o'clock a.m. He is up he is same to use yes or things yes she is here okay okay and did notwith steroid eyedrops 4. Insomnia: I encouraged her to take 2 Ativan once at bedtime. 5. Fever up to 100F: It could be related to the very high white count but it could also be related to infection.  7. Dehydration: received IV fluids with normal saline along with Zofran. 8. Diarrhea: Improved with Imodium 9. Skin dryness leading to itching: I instructed her to use Vaseline on Aquaphor 10. Diarrhea alternating with constipation  Anemia:Hemoglobin electrophoresis 10/31/2015: Normal . Her daughter and her mother are all anemic. Her hemoglobin is 11.1 mg  Monitoring very closely for chemotherapy toxicities. Return to clinic with cycle 5 of Taxol and carboplatin (lower dosage). I decreased the dosing of Taxol to 50 mg/m.  Return to clinic in 2 weeks for toxicity check and follow-up.    No orders of the defined types were placed in this encounter.  The patient has a good understanding of the overall plan. she agrees with it. she will call with any problems that may develop before the next visit here.   Rulon Eisenmenger, MD 12/05/2015

## 2015-12-06 ENCOUNTER — Ambulatory Visit: Payer: Self-pay

## 2015-12-06 ENCOUNTER — Other Ambulatory Visit: Payer: Self-pay

## 2015-12-08 ENCOUNTER — Ambulatory Visit (HOSPITAL_BASED_OUTPATIENT_CLINIC_OR_DEPARTMENT_OTHER): Payer: Federal, State, Local not specified - PPO

## 2015-12-08 ENCOUNTER — Other Ambulatory Visit: Payer: Self-pay

## 2015-12-08 ENCOUNTER — Other Ambulatory Visit (HOSPITAL_BASED_OUTPATIENT_CLINIC_OR_DEPARTMENT_OTHER): Payer: Federal, State, Local not specified - PPO

## 2015-12-08 ENCOUNTER — Encounter (HOSPITAL_COMMUNITY): Payer: Self-pay | Admitting: Emergency Medicine

## 2015-12-08 ENCOUNTER — Ambulatory Visit (HOSPITAL_BASED_OUTPATIENT_CLINIC_OR_DEPARTMENT_OTHER): Payer: Federal, State, Local not specified - PPO | Admitting: Nurse Practitioner

## 2015-12-08 ENCOUNTER — Other Ambulatory Visit: Payer: Self-pay | Admitting: *Deleted

## 2015-12-08 ENCOUNTER — Inpatient Hospital Stay (HOSPITAL_COMMUNITY)
Admission: EM | Admit: 2015-12-08 | Discharge: 2015-12-12 | DRG: 394 | Disposition: A | Discharge status: Home / Self Care | Payer: Federal, State, Local not specified - PPO | Attending: Internal Medicine | Admitting: Internal Medicine

## 2015-12-08 ENCOUNTER — Emergency Department (HOSPITAL_COMMUNITY): Payer: Federal, State, Local not specified - PPO

## 2015-12-08 ENCOUNTER — Telehealth: Payer: Self-pay | Admitting: *Deleted

## 2015-12-08 VITALS — BP 117/86 | HR 109 | Temp 99.2°F | Resp 16

## 2015-12-08 DIAGNOSIS — C50412 Malignant neoplasm of upper-outer quadrant of left female breast: Secondary | ICD-10-CM | POA: Diagnosis present

## 2015-12-08 DIAGNOSIS — R112 Nausea with vomiting, unspecified: Secondary | ICD-10-CM

## 2015-12-08 DIAGNOSIS — E669 Obesity, unspecified: Secondary | ICD-10-CM | POA: Diagnosis present

## 2015-12-08 DIAGNOSIS — E860 Dehydration: Secondary | ICD-10-CM | POA: Diagnosis not present

## 2015-12-08 DIAGNOSIS — R197 Diarrhea, unspecified: Secondary | ICD-10-CM | POA: Diagnosis present

## 2015-12-08 DIAGNOSIS — G620 Drug-induced polyneuropathy: Secondary | ICD-10-CM | POA: Diagnosis present

## 2015-12-08 DIAGNOSIS — D701 Agranulocytosis secondary to cancer chemotherapy: Secondary | ICD-10-CM | POA: Diagnosis present

## 2015-12-08 DIAGNOSIS — R1110 Vomiting, unspecified: Secondary | ICD-10-CM | POA: Diagnosis present

## 2015-12-08 DIAGNOSIS — Z91041 Radiographic dye allergy status: Secondary | ICD-10-CM

## 2015-12-08 DIAGNOSIS — K521 Toxic gastroenteritis and colitis: Secondary | ICD-10-CM | POA: Diagnosis not present

## 2015-12-08 DIAGNOSIS — N83201 Unspecified ovarian cyst, right side: Secondary | ICD-10-CM | POA: Diagnosis present

## 2015-12-08 DIAGNOSIS — T451X5A Adverse effect of antineoplastic and immunosuppressive drugs, initial encounter: Secondary | ICD-10-CM

## 2015-12-08 DIAGNOSIS — N949 Unspecified condition associated with female genital organs and menstrual cycle: Secondary | ICD-10-CM | POA: Diagnosis present

## 2015-12-08 DIAGNOSIS — Z6841 Body Mass Index (BMI) 40.0 and over, adult: Secondary | ICD-10-CM

## 2015-12-08 DIAGNOSIS — D6481 Anemia due to antineoplastic chemotherapy: Secondary | ICD-10-CM | POA: Diagnosis present

## 2015-12-08 DIAGNOSIS — Z886 Allergy status to analgesic agent status: Secondary | ICD-10-CM

## 2015-12-08 DIAGNOSIS — K219 Gastro-esophageal reflux disease without esophagitis: Secondary | ICD-10-CM | POA: Diagnosis present

## 2015-12-08 DIAGNOSIS — Z79899 Other long term (current) drug therapy: Secondary | ICD-10-CM

## 2015-12-08 DIAGNOSIS — Z885 Allergy status to narcotic agent status: Secondary | ICD-10-CM

## 2015-12-08 DIAGNOSIS — J45909 Unspecified asthma, uncomplicated: Secondary | ICD-10-CM | POA: Diagnosis present

## 2015-12-08 DIAGNOSIS — R740 Nonspecific elevation of levels of transaminase and lactic acid dehydrogenase [LDH]: Secondary | ICD-10-CM | POA: Diagnosis present

## 2015-12-08 LAB — COMPREHENSIVE METABOLIC PANEL
ALT: 98 U/L — AB (ref 0–55)
ANION GAP: 8 meq/L (ref 3–11)
AST: 60 U/L — ABNORMAL HIGH (ref 5–34)
Albumin: 3.5 g/dL (ref 3.5–5.0)
Alkaline Phosphatase: 58 U/L (ref 40–150)
BILIRUBIN TOTAL: 0.69 mg/dL (ref 0.20–1.20)
BUN: 13.6 mg/dL (ref 7.0–26.0)
CHLORIDE: 108 meq/L (ref 98–109)
CO2: 22 meq/L (ref 22–29)
CREATININE: 0.9 mg/dL (ref 0.6–1.1)
Calcium: 10.4 mg/dL (ref 8.4–10.4)
GLUCOSE: 143 mg/dL — AB (ref 70–140)
Potassium: 3.7 mEq/L (ref 3.5–5.1)
SODIUM: 138 meq/L (ref 136–145)
TOTAL PROTEIN: 7.3 g/dL (ref 6.4–8.3)

## 2015-12-08 LAB — CBC WITH DIFFERENTIAL/PLATELET
BASO%: 1 % (ref 0.0–2.0)
Basophils Absolute: 0 10*3/uL (ref 0.0–0.1)
EOS%: 3.5 % (ref 0.0–7.0)
Eosinophils Absolute: 0.1 10*3/uL (ref 0.0–0.5)
HCT: 38.4 % (ref 34.8–46.6)
HGB: 12.4 g/dL (ref 11.6–15.9)
LYMPH#: 1.2 10*3/uL (ref 0.9–3.3)
LYMPH%: 28.7 % (ref 14.0–49.7)
MCH: 28.4 pg (ref 25.1–34.0)
MCHC: 32.4 g/dL (ref 31.5–36.0)
MCV: 87.7 fL (ref 79.5–101.0)
MONO#: 0.4 10*3/uL (ref 0.1–0.9)
MONO%: 8.9 % (ref 0.0–14.0)
NEUT%: 57.9 % (ref 38.4–76.8)
NEUTROS ABS: 2.4 10*3/uL (ref 1.5–6.5)
PLATELETS: 365 10*3/uL (ref 145–400)
RBC: 4.38 10*6/uL (ref 3.70–5.45)
RDW: 22.9 % — AB (ref 11.2–14.5)
WBC: 4.2 10*3/uL (ref 3.9–10.3)

## 2015-12-08 MED ORDER — SODIUM CHLORIDE 0.9 % IV SOLN
Freq: Once | INTRAVENOUS | Status: AC
  Administered 2015-12-08: 15:00:00 via INTRAVENOUS
  Filled 2015-12-08: qty 4

## 2015-12-08 MED ORDER — ONDANSETRON HCL 4 MG/2ML IJ SOLN: 8.0000 mg | Freq: Once | INTRAMUSCULAR | Status: DC

## 2015-12-08 MED ORDER — SODIUM CHLORIDE 0.9 % IV BOLUS (SEPSIS)
1000.0000 mL | Freq: Once | INTRAVENOUS | Status: AC
  Administered 2015-12-08: 1000 mL via INTRAVENOUS

## 2015-12-08 MED ORDER — METOCLOPRAMIDE HCL 5 MG/ML IJ SOLN
10.0000 mg | Freq: Once | INTRAMUSCULAR | Status: AC
  Administered 2015-12-08: 10 mg via INTRAVENOUS
  Filled 2015-12-08: qty 2

## 2015-12-08 MED ORDER — MORPHINE SULFATE (PF) 4 MG/ML IV SOLN
4.0000 mg | Freq: Once | INTRAVENOUS | Status: AC
  Administered 2015-12-08: 4 mg via INTRAVENOUS
  Filled 2015-12-08: qty 1

## 2015-12-08 MED ORDER — SODIUM CHLORIDE 0.9 % IV BOLUS (SEPSIS)
500.0000 mL | Freq: Once | INTRAVENOUS | Status: AC
  Administered 2015-12-08: 500 mL via INTRAVENOUS

## 2015-12-08 MED ORDER — LORAZEPAM 2 MG/ML IJ SOLN
INTRAMUSCULAR | Status: AC
Start: 2015-12-08 — End: 2015-12-08
  Filled 2015-12-08: qty 1

## 2015-12-08 MED ORDER — ONDANSETRON HCL 4 MG/2ML IJ SOLN
4.0000 mg | Freq: Once | INTRAMUSCULAR | Status: AC
  Administered 2015-12-08: 4 mg via INTRAVENOUS
  Filled 2015-12-08: qty 2

## 2015-12-08 MED ORDER — LORAZEPAM 2 MG/ML IJ SOLN
1.0000 mg | Freq: Once | INTRAMUSCULAR | Status: AC
  Administered 2015-12-08: 1 mg via INTRAVENOUS

## 2015-12-08 MED ORDER — SODIUM CHLORIDE 0.9 % IV SOLN
INTRAVENOUS | Status: AC
  Administered 2015-12-08: 15:00:00 via INTRAVENOUS

## 2015-12-08 NOTE — ED Notes (Signed)
Pt presents by wheelchair from [facility] with nausea, vomiting and diarrhea since yesterday.  Pt had chemo 3 days ago but no illness until yesterday and has not been around anyone with illness that she knows of.  Pt had CMET checked at [facility] today and liver enzymes reported elevated greater than when she was in 3 days ago.  8 mg Zofran, 1000 ml of NS and 1 mg Ativan given via port access at [facility] before arrival to ED.  No fever and Negative for flu swab.

## 2015-12-08 NOTE — Progress Notes (Signed)
Selena Lesser, NP came to chairside.

## 2015-12-08 NOTE — ED Notes (Signed)
Bed: WA04 Expected date:  Expected time:  Means of arrival:  Comments: [facility] pt/vomiting

## 2015-12-08 NOTE — Telephone Encounter (Signed)
Received call from patient stating she started feeling sick last night and then this morning was vomiting x 5 times. She is not able to keep anything down.  She tried taking her nausea medication but was unable to keep this down.  She already has an appointment for IVF's today.  Spoke with Clarise Cruz, RN symptom management we will get stat labs and Cyndee, NP will see her in the chemo room today when she arrives.

## 2015-12-08 NOTE — Patient Instructions (Signed)

## 2015-12-08 NOTE — ED Notes (Signed)
Pt pain level remains at a "4" out of "10" and nausea has reduced to half of what it was when she came to the ED;

## 2015-12-08 NOTE — Progress Notes (Signed)
Patient transported via wheelchair to ED per Selena Lesser, NP.

## 2015-12-09 ENCOUNTER — Other Ambulatory Visit: Payer: Self-pay

## 2015-12-09 DIAGNOSIS — D701 Agranulocytosis secondary to cancer chemotherapy: Secondary | ICD-10-CM | POA: Diagnosis present

## 2015-12-09 DIAGNOSIS — K521 Toxic gastroenteritis and colitis: Secondary | ICD-10-CM | POA: Diagnosis present

## 2015-12-09 DIAGNOSIS — T451X5A Adverse effect of antineoplastic and immunosuppressive drugs, initial encounter: Secondary | ICD-10-CM | POA: Diagnosis present

## 2015-12-09 DIAGNOSIS — G620 Drug-induced polyneuropathy: Secondary | ICD-10-CM

## 2015-12-09 DIAGNOSIS — N83201 Unspecified ovarian cyst, right side: Secondary | ICD-10-CM | POA: Diagnosis present

## 2015-12-09 DIAGNOSIS — Z6841 Body Mass Index (BMI) 40.0 and over, adult: Secondary | ICD-10-CM | POA: Diagnosis not present

## 2015-12-09 DIAGNOSIS — C50412 Malignant neoplasm of upper-outer quadrant of left female breast: Secondary | ICD-10-CM

## 2015-12-09 DIAGNOSIS — J4520 Mild intermittent asthma, uncomplicated: Secondary | ICD-10-CM | POA: Diagnosis not present

## 2015-12-09 DIAGNOSIS — R112 Nausea with vomiting, unspecified: Secondary | ICD-10-CM

## 2015-12-09 DIAGNOSIS — K219 Gastro-esophageal reflux disease without esophagitis: Secondary | ICD-10-CM | POA: Diagnosis present

## 2015-12-09 DIAGNOSIS — J45909 Unspecified asthma, uncomplicated: Secondary | ICD-10-CM | POA: Diagnosis present

## 2015-12-09 DIAGNOSIS — Z79899 Other long term (current) drug therapy: Secondary | ICD-10-CM | POA: Diagnosis not present

## 2015-12-09 DIAGNOSIS — D6481 Anemia due to antineoplastic chemotherapy: Secondary | ICD-10-CM | POA: Diagnosis present

## 2015-12-09 DIAGNOSIS — E860 Dehydration: Secondary | ICD-10-CM | POA: Diagnosis present

## 2015-12-09 DIAGNOSIS — R740 Nonspecific elevation of levels of transaminase and lactic acid dehydrogenase [LDH]: Secondary | ICD-10-CM | POA: Diagnosis present

## 2015-12-09 DIAGNOSIS — N949 Unspecified condition associated with female genital organs and menstrual cycle: Secondary | ICD-10-CM | POA: Diagnosis not present

## 2015-12-09 DIAGNOSIS — R197 Diarrhea, unspecified: Secondary | ICD-10-CM | POA: Diagnosis present

## 2015-12-09 DIAGNOSIS — E669 Obesity, unspecified: Secondary | ICD-10-CM | POA: Diagnosis present

## 2015-12-09 DIAGNOSIS — R1110 Vomiting, unspecified: Secondary | ICD-10-CM | POA: Diagnosis present

## 2015-12-09 DIAGNOSIS — R1031 Right lower quadrant pain: Secondary | ICD-10-CM | POA: Diagnosis not present

## 2015-12-09 DIAGNOSIS — Z91041 Radiographic dye allergy status: Secondary | ICD-10-CM | POA: Diagnosis not present

## 2015-12-09 DIAGNOSIS — Z886 Allergy status to analgesic agent status: Secondary | ICD-10-CM | POA: Diagnosis not present

## 2015-12-09 DIAGNOSIS — Z885 Allergy status to narcotic agent status: Secondary | ICD-10-CM | POA: Diagnosis not present

## 2015-12-09 LAB — COMPREHENSIVE METABOLIC PANEL
ALBUMIN: 3.4 g/dL — AB (ref 3.5–5.0)
ALK PHOS: 53 U/L (ref 38–126)
ALT: 142 U/L — AB (ref 14–54)
AST: 178 U/L — AB (ref 15–41)
Anion gap: 5 (ref 5–15)
BUN: 11 mg/dL (ref 6–20)
CALCIUM: 9.2 mg/dL (ref 8.9–10.3)
CHLORIDE: 112 mmol/L — AB (ref 101–111)
CO2: 24 mmol/L (ref 22–32)
CREATININE: 0.58 mg/dL (ref 0.44–1.00)
GFR calc Af Amer: >60 mL/min (ref >60)
GFR calc non Af Amer: >60 mL/min (ref >60)
GLUCOSE: 109 mg/dL — AB (ref 65–99)
Potassium: 3.6 mmol/L (ref 3.5–5.1)
SODIUM: 141 mmol/L (ref 135–145)
Total Bilirubin: 1 mg/dL (ref 0.3–1.2)
Total Protein: 6.2 g/dL — ABNORMAL LOW (ref 6.5–8.1)

## 2015-12-09 LAB — CBC
HCT: 32.6 % — ABNORMAL LOW (ref 36.0–46.0)
Hemoglobin: 10.7 g/dL — ABNORMAL LOW (ref 12.0–15.0)
MCH: 28.8 pg (ref 26.0–34.0)
MCHC: 32.8 g/dL (ref 30.0–36.0)
MCV: 87.9 fL (ref 78.0–100.0)
PLATELETS: 307 10*3/uL (ref 150–400)
RBC: 3.71 MIL/uL — AB (ref 3.87–5.11)
RDW: 20.1 % — ABNORMAL HIGH (ref 11.5–15.5)
WBC: 2.8 10*3/uL — ABNORMAL LOW (ref 4.0–10.5)

## 2015-12-09 LAB — GASTROINTESTINAL PANEL BY PCR, STOOL (REPLACES STOOL CULTURE)
ASTROVIRUS: NOT DETECTED
Adenovirus F40/41: NOT DETECTED
Campylobacter species: NOT DETECTED
Cryptosporidium: NOT DETECTED
Cyclospora cayetanensis: NOT DETECTED
E. coli O157: NOT DETECTED
ENTAMOEBA HISTOLYTICA: NOT DETECTED
ENTEROAGGREGATIVE E COLI (EAEC): NOT DETECTED
ENTEROTOXIGENIC E COLI (ETEC): NOT DETECTED
Enteropathogenic E coli (EPEC): NOT DETECTED
Giardia lamblia: NOT DETECTED
NOROVIRUS GI/GII: NOT DETECTED
PLESIMONAS SHIGELLOIDES: NOT DETECTED
Rotavirus A: NOT DETECTED
SAPOVIRUS (I, II, IV, AND V): NOT DETECTED
SHIGA LIKE TOXIN PRODUCING E COLI (STEC): NOT DETECTED
Salmonella species: NOT DETECTED
Shigella/Enteroinvasive E coli (EIEC): NOT DETECTED
VIBRIO CHOLERAE: NOT DETECTED
Vibrio species: NOT DETECTED
Yersinia enterocolitica: NOT DETECTED

## 2015-12-09 LAB — PROTIME-INR
INR: 1.05 (ref 0.00–1.49)
Prothrombin Time: 13.5 seconds (ref 11.6–15.2)

## 2015-12-09 LAB — I-STAT BETA HCG BLOOD, ED (MC, WL, AP ONLY): I-stat hCG, quantitative: <5 m[IU]/mL (ref <5)

## 2015-12-09 LAB — GLUCOSE, CAPILLARY: GLUCOSE-CAPILLARY: 96 mg/dL (ref 65–99)

## 2015-12-09 LAB — INFLUENZA A AND B
INFLUENZA A AG, EIA: NEGATIVE
INFLUENZA B AG, EIA: NEGATIVE

## 2015-12-09 LAB — C DIFFICILE QUICK SCREEN W PCR REFLEX
C DIFFICILE (CDIFF) TOXIN: NEGATIVE
C DIFFICLE (CDIFF) ANTIGEN: NEGATIVE
C Diff interpretation: NEGATIVE

## 2015-12-09 LAB — LIPASE, BLOOD: LIPASE: 31 U/L (ref 11–51)

## 2015-12-09 MED ORDER — ALBUTEROL SULFATE HFA 108 (90 BASE) MCG/ACT IN AERS: 2.0000 | INHALATION_SPRAY | RESPIRATORY_TRACT | Status: DC | PRN

## 2015-12-09 MED ORDER — SODIUM CHLORIDE 0.9 % IV SOLN
INTRAVENOUS | Status: DC
  Administered 2015-12-09 – 2015-12-12 (×7): via INTRAVENOUS

## 2015-12-09 MED ORDER — ONDANSETRON HCL 4 MG/2ML IJ SOLN
4.0000 mg | Freq: Three times a day (TID) | INTRAMUSCULAR | Status: DC | PRN
  Administered 2015-12-09 – 2015-12-10 (×2): 4 mg via INTRAVENOUS
  Filled 2015-12-09 (×2): qty 2

## 2015-12-09 MED ORDER — SODIUM CHLORIDE 0.9% FLUSH
3.0000 mL | Freq: Two times a day (BID) | INTRAVENOUS | Status: DC
  Administered 2015-12-09: 3 mL via INTRAVENOUS

## 2015-12-09 MED ORDER — ENOXAPARIN SODIUM 40 MG/0.4ML ~~LOC~~ SOLN
40.0000 mg | SUBCUTANEOUS | Status: DC
  Filled 2015-12-09: qty 0.4

## 2015-12-09 MED ORDER — MORPHINE SULFATE (PF) 2 MG/ML IV SOLN
2.0000 mg | INTRAVENOUS | Status: DC | PRN
Start: 2015-12-09 — End: 2015-12-12
  Administered 2015-12-09 – 2015-12-12 (×8): 2 mg via INTRAVENOUS
  Filled 2015-12-09 (×8): qty 1

## 2015-12-09 MED ORDER — SODIUM CHLORIDE 0.9% FLUSH
10.0000 mL | INTRAVENOUS | Status: DC | PRN
  Administered 2015-12-09 – 2015-12-12 (×5): 10 mL
  Filled 2015-12-09 (×5): qty 40

## 2015-12-09 MED ORDER — ENOXAPARIN SODIUM 60 MG/0.6ML ~~LOC~~ SOLN
60.0000 mg | Freq: Every day | SUBCUTANEOUS | Status: DC
  Administered 2015-12-09 – 2015-12-12 (×4): 60 mg via SUBCUTANEOUS
  Filled 2015-12-09 (×4): qty 0.6

## 2015-12-09 MED ORDER — ALBUTEROL SULFATE (2.5 MG/3ML) 0.083% IN NEBU: 2.5000 mg | INHALATION_SOLUTION | RESPIRATORY_TRACT | Status: DC | PRN

## 2015-12-09 MED ORDER — LIDOCAINE-PRILOCAINE 2.5-2.5 % EX CREA: TOPICAL_CREAM | Freq: Every day | CUTANEOUS | Status: DC | PRN

## 2015-12-09 MED ORDER — ALTEPLASE 2 MG IJ SOLR
2.0000 mg | Freq: Once | INTRAMUSCULAR | Status: AC
  Administered 2015-12-09: 2 mg
  Filled 2015-12-09: qty 2

## 2015-12-09 MED ORDER — TRAMADOL HCL 50 MG PO TABS
50.0000 mg | ORAL_TABLET | Freq: Four times a day (QID) | ORAL | Status: DC | PRN
  Administered 2015-12-09 – 2015-12-12 (×2): 50 mg via ORAL
  Filled 2015-12-09 (×3): qty 1

## 2015-12-09 MED ORDER — PROCHLORPERAZINE EDISYLATE 5 MG/ML IJ SOLN
5.0000 mg | Freq: Four times a day (QID) | INTRAMUSCULAR | Status: DC | PRN
  Administered 2015-12-10: 5 mg via INTRAVENOUS
  Filled 2015-12-09: qty 2

## 2015-12-09 MED ORDER — FAMOTIDINE IN NACL 20-0.9 MG/50ML-% IV SOLN
20.0000 mg | Freq: Two times a day (BID) | INTRAVENOUS | Status: DC
  Administered 2015-12-09 – 2015-12-12 (×8): 20 mg via INTRAVENOUS
  Filled 2015-12-09 (×9): qty 50

## 2015-12-09 MED ORDER — LORAZEPAM 0.5 MG PO TABS
0.5000 mg | ORAL_TABLET | Freq: Every day | ORAL | Status: DC
  Administered 2015-12-09 – 2015-12-11 (×4): 0.5 mg via ORAL
  Filled 2015-12-09 (×4): qty 1

## 2015-12-09 NOTE — Progress Notes (Signed)
Sent down GI panel and C-Diff. Bowel movement is formed, not loose.

## 2015-12-09 NOTE — Progress Notes (Signed)
Progress Note   Cindy Bradshaw [id number] DOB: 1971/02/01 DOA: 12/08/2015 PCP: [name], MD   Brief Narrative:   Cindy Bradshaw is an 45 y.o. female with a PMH of asthma, GERD, anxiety, migraine headaches, obesity, and triple negative left breast cancer on neoadjuvant chemotherapy with weekly Taxol and carboplatin, who was admitted 12/08/15 with chief complaint of nausea, vomiting, diarrhea and abdominal pain. Upon initial evaluation in the ED, WBC was 4.2. No significant electrolyte or renal impairment. CT of the abdomen and pelvis was negative for acute intra-abdominal findings.  Assessment/Plan:   Principal Problem:   Nausea vomiting and diarrhea/abdominal pain/Dehydration Admitted under observation status for IV hydration, antiemetics and supportive care. Suspect this is chemotherapy-induced enteritis. GI pathogen panel and C. difficile studies requested.  Active Problems:   Leukopenia/anemia Chemotherapy associated. Monitor counts.    Transaminitis Monitor.    Breast cancer of upper-outer quadrant of left female breast ([place]) Received Taxol and carboplatin on 12/05/15. Has triple negative disease.    Chemotherapy-induced peripheral neuropathy (HCC) Tramadol ordered as needed.    Asthma Albuterol ordered as needed.    DVT Prophylaxis Lovenox ordered.   Family Communication/Anticipated D/C date and plan/Code Status   Family Communication: Husband updated at bedside. Disposition Plan/date: Home when nausea, vomiting and diarrhea improved, possibly tomorrow. Code Status: Full code.   Procedures and diagnostic studies:   Ct Abdomen Pelvis Wo Contrast  12/09/2015  CLINICAL DATA:  Right lower quadrant pain with vomiting and diarrhea. Currently receiving chemotherapy for breast carcinoma. EXAM: CT ABDOMEN AND PELVIS WITHOUT CONTRAST TECHNIQUE: Multidetector CT imaging of the abdomen and pelvis was performed following the standard protocol without IV  contrast. COMPARISON:  None. FINDINGS: Lower chest:  Visualized lung bases are unremarkable. Hepatobiliary: Unenhanced appearance of the liver is unremarkable. The gallbladder has been removed. Pancreas: Normal unenhanced appearance of the pancreas. Spleen: Normal unenhanced appearance of the spleen. Adrenals/Urinary Tract: Normal appearance of the kidneys without hydronephrosis. No renal or urinary tract calculi identified. The bladder is decompressed. Stomach/Bowel: Bowel loops show no evidence of obstruction or inflammation. No bowel lesions are seen. The appendix is well visualized and normal without evidence of appendicitis. No free air. Vascular/Lymphatic: No enlarged lymph nodes are identified. Reproductive: Uterus and adnexal regions appear unremarkable by CT with dominant cyst of the right adnexa measuring approximately 4.3 cm. This is likely physiologic. No associated free fluid in the pelvis. Other: No evidence of abscess.  No hernia identified. Musculoskeletal: Bony structures are normal and show no evidence of fracture or lesion. IMPRESSION: Unremarkable CT of the abdomen and pelvis without contrast. 4 cm right adnexal cyst is likely physiologic. Electronically Signed   By: Aletta Edouard M.D.   On: 12/09/2015 00:14    Medical Consultants:    None.  Anti-Infectives:   Anti-infectives    None      Subjective:   Cindy Bradshaw is still very nauseated.  Has only been able to tolerate sips of water.  No further diarrhea.  Objective:    Filed Vitals:   12/08/15 2333 12/09/15 0100 12/09/15 0130 12/09/15 0327  BP: 127/93 143/98 152/91 130/76  Pulse: 104 100 97 91  Temp:    98.7 F (37.1 C)  TempSrc:    Oral  Resp: 16 31 17    Height:    5\' 5"  (1.651 m)  Weight:    117.1 kg (258 lb 2.5 oz)  SpO2: 96% 95% 96% 99%   No intake or output  data in the 24 hours ending 12/09/15 0828 Filed Weights   12/09/15 0327  Weight: 117.1 kg (258 lb 2.5 oz)    Exam: Gen:  Weak, alopecia  present, Porta-cath right chest wall Cardiovascular:  RRR, No M/R/G Respiratory:  Lungs CTAB Gastrointestinal:  Abdomen soft, NT/ND, + BS Extremities:  No C/E/C   Data Reviewed:    Labs: Basic Metabolic Panel:  Recent Labs Lab 12/05/15 1352 12/08/15 1355 12/09/15 0452  NA 141 138 141  K 3.6 3.7 3.6  CL  --   --  112*  CO2 26 22 24   GLUCOSE 111 143* 109*  BUN 10.9 13.6 11  CREATININE 0.8 0.9 0.58  CALCIUM 10.1 10.4 9.2   GFR Estimated Creatinine Clearance: 112.4 mL/min (by C-G formula based on Cr of 0.58). Liver Function Tests:  Recent Labs Lab 12/05/15 1352 12/08/15 1355 12/09/15 0452  AST 43* 60* 178*  ALT 65* 98* 142*  ALKPHOS 55 58 53  BILITOT <0.30 0.69 1.0  PROT 7.0 7.3 6.2*  ALBUMIN 3.4* 3.5 3.4*    Recent Labs Lab 12/09/15 0452  LIPASE 31   Coagulation profile  Recent Labs Lab 12/09/15 0452  INR 1.05    CBC:  Recent Labs Lab 12/05/15 1352 12/08/15 1355 12/09/15 0452  WBC 4.9 4.2 2.8*  NEUTROABS 2.8 2.4  --   HGB 11.1* 12.4 10.7*  HCT 34.2* 38.4 32.6*  MCV 88.0 87.7 87.9  PLT 370 365 307   CBG:  Recent Labs Lab 12/09/15 0737  GLUCAP 96   Microbiology No results found for this or any previous visit (from the past 240 hour(s)).   Medications:   . enoxaparin (LOVENOX) injection  60 mg Subcutaneous Daily  . famotidine (PEPCID) IV  20 mg Intravenous Q12H  . LORazepam  0.5 mg Oral QHS  . sodium chloride flush  3 mL Intravenous Q12H   Continuous Infusions: . sodium chloride 100 mL/hr at 12/09/15 0406    Time spent: 25 minutes.     [facility] Pager [phone]. If unable to reach me by pager, please call my cell phone at [phone].  *Please refer to [email or web address], password [email or web address] to get updated schedule on who will round on this patient, as hospitalists switch teams weekly. If 7PM-7AM, please contact night-coverage at [email or web address], password [email or web address] for any overnight needs.  12/09/2015, 8:28 AM

## 2015-12-09 NOTE — ED Provider Notes (Signed)
CSN: [id number]     Arrival date & time 12/08/15  1741 History   First MD Initiated Contact with Patient 12/08/15 1745     Chief Complaint  Patient presents with  . abnormal labs   . Nausea  . Emesis  . Diarrhea     (Consider location/radiation/quality/duration/timing/severity/associated sxs/prior Treatment) HPI Comments: 45 y.o. Female with history of breast CA on chemotherapy (last 12/05/15) presents from the infusion clinic for nausea, vomiting, diarrhea.  The patient states that she has not been able to eat or drink at home and that she went to her scheduled IV fluid infusion appointment today and was sent here after receiving fluids as she still did not feel better.  She denies fever or chills.  Reports associated right lowe abdominal pain.  Patient is a 45 y.o. female presenting with vomiting and diarrhea.  Emesis Associated symptoms: abdominal pain and diarrhea   Associated symptoms: no headaches and no myalgias   Diarrhea Associated symptoms: abdominal pain and vomiting   Associated symptoms: no fever, no headaches and no myalgias     Past Medical History  Diagnosis Date  . Cancer ([place])   . Migraine   . Asthma   . Breast cancer ([place])   . Arthritis   . Back disorder     degenerative disk disease  . Pap smear for cervical cancer screening 08/12/15  . Mammogram abnormal 08/24/15    first  . GERD (gastroesophageal reflux disease)    Past Surgical History  Procedure Laterality Date  . Cholecystectomy    . Cesarean section    . Portacath placement [name] 09/12/2015    Procedure: INSERTION PORT-A-CATH;  Surgeon: Excell Seltzer, MD;  Location: [facility];  Service: General;  Laterality: [name];   Family History  Problem Relation Age of Onset  . Lung cancer Mother 22    2 different types of lung cancer; metastasis to brain; smoker  . Other Mother     hx of hysterectomy for unspecified reason  . Bone cancer Maternal Uncle     dx. early 69s  . Breast cancer Maternal Grandmother      dx. early 73s, s/p mastectomy  . Cancer Paternal Grandfather     unspecified type of cancer, dx. late 33s  . Congestive Heart Failure Father     smoker  . Stroke Father   . Cirrhosis Maternal Grandfather   . Heart Problems Maternal Grandfather   . Lung cancer Other     (maternal great uncle; MGM's brother); had a coal stove  . Cancer Cousin     unspecified type; d. early age (paternal first cousin once-removed)  . Cancer Cousin     dx. as a kid; in remission today; (paternal 2nd cousin)  . Cancer Cousin     unspecified type; d. early 31s; (maternal 1st cousin)   Social History  Substance Use Topics  . Smoking status: Never Smoker   . Smokeless tobacco: Never Used  . Alcohol Use: 0.0 oz/week    0 Standard drinks or equivalent per week     Comment: occassional glass of wine   OB History    Gravida Para Term Preterm AB TAB SAB Ectopic Multiple Living   0 0 0 0 0 0 0 0       Review of Systems  Constitutional: Positive for appetite change. Negative for fever and fatigue.  HENT: Negative for congestion, postnasal drip and sinus pressure.   Eyes: Negative for pain.  Respiratory: Negative for cough, chest  tightness and shortness of breath.   Cardiovascular: Negative for chest pain and palpitations.  Gastrointestinal: Positive for nausea, vomiting, abdominal pain and diarrhea.  Genitourinary: Negative for dysuria, urgency and hematuria.  Musculoskeletal: Negative for myalgias and back pain.  Skin: Negative for rash.  Neurological: Negative for dizziness, weakness and headaches.  Hematological: Does not bruise/bleed easily.      Allergies  Dilaudid; Ivp dye; Percocet; and Toradol  Home Medications   Prior to Admission medications   Medication Sig Start Date End Date Taking? Authorizing Provider  diphenoxylate-atropine (LOMOTIL) 2.5-0.025 MG tablet Take 2 tablets by mouth 4 (four) times daily as needed for diarrhea or loose stools. 11/17/15  Yes Susanne Borders, NP   lidocaine-prilocaine (EMLA) cream Apply to affected area as directed. 09/07/15  Yes Historical Provider, MD  LORazepam (ATIVAN) 0.5 MG tablet Take 0.5 mg by mouth at bedtime. 09/08/15  Yes Historical Provider, MD  ondansetron (ZOFRAN) 8 MG tablet Take 1 tablet (8 mg total) by mouth every 8 (eight) hours as needed for nausea or vomiting. 11/15/15  Yes Nicholas Lose, MD  PROAIR HFA 108 (90 BASE) MCG/ACT inhaler Inhale 2 puffs into the lungs every 4 (four) hours as needed. For shortness of breath 03/28/15  Yes Historical Provider, MD  traMADol (ULTRAM) 50 MG tablet Take 1 tablet (50 mg total) by mouth every 6 (six) hours as needed for moderate pain or severe pain. 11/18/15  Yes Susanne Borders, NP   BP 127/93 mmHg  Pulse 104  Temp(Src) 99 F (37.2 C) (Oral)  Resp 16  SpO2 96%  LMP  Physical Exam  Constitutional: She is oriented to person, place, and time. She appears well-developed and well-nourished.  Non-toxic appearance. She appears ill. No distress.  HENT:  Head: Normocephalic and atraumatic.  Right Ear: External ear normal.  Left Ear: External ear normal.  Nose: Nose normal.  Mouth/Throat: Oropharynx is clear and moist. Mucous membranes are dry. No oropharyngeal exudate.  Eyes: EOM are normal. Pupils are equal, round, and reactive to light.  Neck: Normal range of motion. Neck supple.  Cardiovascular: Regular rhythm, normal heart sounds and intact distal pulses.  Tachycardia present.   No murmur heard. Pulmonary/Chest: Effort normal. No respiratory distress. She has no wheezes. She has no rales.  Abdominal: Soft. She exhibits no distension. There is tenderness in the right lower quadrant.  Musculoskeletal: Normal range of motion. She exhibits no edema or tenderness.  Neurological: She is alert and oriented to person, place, and time.  Skin: Skin is warm and dry. No rash noted. She is not diaphoretic.  Vitals reviewed.   ED Course  Procedures (including critical care time) Labs  Review Labs Reviewed  I-STAT BETA HCG BLOOD, ED ([facility], [facility], [facility] ONLY)    Imaging Review Ct Abdomen Pelvis Wo Contrast  12/09/2015  CLINICAL DATA:  Right lower quadrant pain with vomiting and diarrhea. Currently receiving chemotherapy for breast carcinoma. EXAM: CT ABDOMEN AND PELVIS WITHOUT CONTRAST TECHNIQUE: Multidetector CT imaging of the abdomen and pelvis was performed following the standard protocol without IV contrast. COMPARISON:  None. FINDINGS: Lower chest:  Visualized lung bases are unremarkable. Hepatobiliary: Unenhanced appearance of the liver is unremarkable. The gallbladder has been removed. Pancreas: Normal unenhanced appearance of the pancreas. Spleen: Normal unenhanced appearance of the spleen. Adrenals/Urinary Tract: Normal appearance of the kidneys without hydronephrosis. No renal or urinary tract calculi identified. The bladder is decompressed. Stomach/Bowel: Bowel loops show no evidence of obstruction or inflammation. No bowel lesions are seen.  The appendix is well visualized and normal without evidence of appendicitis. No free air. Vascular/Lymphatic: No enlarged lymph nodes are identified. Reproductive: Uterus and adnexal regions appear unremarkable by CT with dominant cyst of the right adnexa measuring approximately 4.3 cm. This is likely physiologic. No associated free fluid in the pelvis. Other: No evidence of abscess.  No hernia identified. Musculoskeletal: Bony structures are normal and show no evidence of fracture or lesion. IMPRESSION: Unremarkable CT of the abdomen and pelvis without contrast. 4 cm right adnexal cyst is likely physiologic. Electronically Signed   By: Aletta Edouard M.D.   On: 12/09/2015 00:14   I have personally reviewed and evaluated these images and lab results as part of my medical decision-making.   EKG Interpretation None      MDM  Patient seen and evaluated with husband at bedside.  Mild tachycardia.  Appears dry on examination.  Lab work from  infusion center reviewed.  Patient hydrated with IV fluids and given antiemetics and morphine for symptom control.  Patient continued to have RLQ pain and so CT obtained without acute finding.  Patient had improvement in symptoms but was still symptomatic and not able to tolerate oral hydration.  For this reason case was discussed with physician on call for [facility] and she was admitted under the care of Dr. Blaine Hamper. Final diagnoses:  None    1. Dehydration  2. Vomiting and diarrhea    Harvel Quale, MD 12/09/15 [phone]

## 2015-12-09 NOTE — Progress Notes (Signed)
HEMATOLOGY-ONCOLOGY PROGRESS NOTE  SUBJECTIVE: Patient is adm with intractable nausea vomiting and diarrhea. She has had intractable issues with nausea and vomiting even with prior rounds of chemotherapy. She has been receiving weekly Taxol and carboplatin and came in yesterday with nausea vomiting and diarrhea. In spite of aggressive outpatient management her symptoms did not improve. She was sent to the emergency room from where she was admitted to the hospital for evaluation and treatment. She reports that the nausea is improved slightly. Diarrhea is ongoing. She is currently receiving IV fluids. Cultures are pending.  OBJECTIVE: REVIEW OF SYSTEMS:   Constitutional: Denies fevers, chills or abnormal weight loss Eyes: Denies blurriness of vision Ears, nose, mouth, throat, and face: Denies mucositis or sore throat Respiratory: Denies cough, dyspnea or wheezes Cardiovascular: Denies palpitation, chest discomfort Gastrointestinal:  Nausea vomiting and diarrhea Skin: Denies abnormal skin rashes Lymphatics: Denies new lymphadenopathy or easy bruising Neurological:Denies numbness, tingling or new weaknesses Behavioral/Psych: Mood is stable, no new changes  Extremities: No lower extremity edema All other systems were reviewed with the patient and are negative.  I have reviewed the past medical history, past surgical history, social history and family history with the patient and they are unchanged from previous note.   PHYSICAL EXAMINATION: ECOG PERFORMANCE STATUS: 3 - Symptomatic, >50% confined to bed  Filed Vitals:   12/09/15 0327 12/09/15 1345  BP: 130/76 129/70  Pulse: 91 99  Temp: 98.7 F (37.1 C) 97.9 F (36.6 C)  Resp:  18   Filed Weights   12/09/15 0327  Weight: 258 lb 2.5 oz (117.1 kg)    GENERAL:alert, no distress and comfortable SKIN: skin color, texture, turgor are normal, no rashes or significant lesions EYES: normal, Conjunctiva are pink and non-injected, sclera  clear OROPHARYNX:no exudate, no erythema and lips, buccal mucosa, and tongue normal  NECK: supple, thyroid normal size, non-tender, without nodularity LYMPH:  no palpable lymphadenopathy in the cervical, axillary or inguinal LUNGS: clear to auscultation and percussion with normal breathing effort HEART: regular rate & rhythm and no murmurs and no lower extremity edema ABDOMEN:abdomen soft, non-tender and normal bowel sounds Musculoskeletal:no cyanosis of digits and no clubbing  NEURO: alert & oriented x 3 with fluent speech, no focal motor/sensory deficits  LABORATORY DATA:  I have reviewed the data as listed CMP Latest Ref Rng 12/09/2015 12/08/2015 12/05/2015  Glucose 65 - 99 mg/dL 109(H) 143(H) 111  BUN 6 - 20 mg/dL 11 13.6 10.9  Creatinine 0.44 - 1.00 mg/dL 0.58 0.9 0.8  Sodium 135 - 145 mmol/L 141 138 141  Potassium 3.5 - 5.1 mmol/L 3.6 3.7 3.6  Chloride 101 - 111 mmol/L 112(H) - -  CO2 22 - 32 mmol/L 24 22 26   Calcium 8.9 - 10.3 mg/dL 9.2 10.4 10.1  Total Protein 6.5 - 8.1 g/dL 6.2(L) 7.3 7.0  Total Bilirubin 0.3 - 1.2 mg/dL 1.0 0.69 <0.30  Alkaline Phos 38 - 126 U/L 53 58 55  AST 15 - 41 U/L 178(H) 60(H) 43(H)  ALT 14 - 54 U/L 142(H) 98(H) 65(H)    Lab Results  Component Value Date   WBC 2.8* 12/09/2015   HGB 10.7* 12/09/2015   HCT 32.6* 12/09/2015   MCV 87.9 12/09/2015   PLT 307 12/09/2015   NEUTROABS 2.4 12/08/2015    ASSESSMENT AND PLAN: 1. Intractable nausea vomiting and diarrhea: Related to chemotherapy. I will discontinue carboplatin from subsequent treatments. She will only get weekly Taxol treatments. 2. Left breast cancer on neoadjuvant chemotherapy for triple  negative disease. Continue with current supportive care and symptomatically management. I will adjust her chemotherapy regimen. Patient becoming next week on Tuesday for chemotherapy. Thank you very much for help.

## 2015-12-09 NOTE — Progress Notes (Signed)
Patient had an episode of vomiting after eating some of her clear liquid tray. She immediately started to vomit. Dr. Rockne Menghini notified. Compazine 5 mg ordered IV. Will continue to monitor.

## 2015-12-09 NOTE — ED Notes (Addendum)
Port stopped pulling back blood/No blood return at present time; flushes well

## 2015-12-09 NOTE — H&P (Signed)
[facility] History and Physical  Cindy Bradshaw [id number] DOB: 1971/07/05 DOA: 12/08/2015  Referring physician: ED physician PCP: [name], MD  Specialists:   Chief Complaint:  Nausea, vomiting, abdominal pain, diarrhea  HPI: Cindy Bradshaw is a 45 y.o. female with PMH of asthma, GERD, anxiety, migraine headache, obesity, breast cancer on chemotherapy, who presents with nausea, vomiting, diarrhea and abdominal pain.  Pt reports that she had chemo 3 days ago. She has been doing fine until yesterday when she started having severe nausea and vomiting. She vomited the morning 5 times today. She also has diarrhea and abdominal pain. She had 4 bowel movements with loose stool today. Her abdominal pain is moderate, located in the right lower quadrant, constant, nonradiating. Patient does not have symptoms of UTI. No chest pain, cough, shortness press, unilateral weakness. She does not have fever, but has chills.  In ED, patient was found to have negative pregnancy test, WBC 4.2, temperature 99.2, tachycardia, electrolytes and renal function okay. CT abdomen/pelvis is negative for acute intra-abdominal abnormalities. Patient is admitted to inpatient for further evaluation and treatment.  EKG: Not done in ED, will get one.   Where does patient live?   At home    Can patient participate in ADLs?   Some   Review of Systems:   General: no fevers, has chills, no changes in body weight, has poor appetite, has fatigue HEENT: no blurry vision, hearing changes or sore throat Pulm: no dyspnea, coughing, wheezing CV: no chest pain, no palpitations Abd: has nausea, vomiting, abdominal pain, diarrhea, no constipation GU: no dysuria, burning on urination, increased urinary frequency, hematuria  Ext: no leg edema Neuro: no unilateral weakness, numbness, or tingling, no vision change or hearing loss Skin: no rash MSK: No muscle spasm, no deformity, no limitation of range of  movement in spin Heme: No easy bruising.  Travel history: No recent long distant travel.  Allergy:  Allergies  Allergen Reactions  . Dilaudid [Hydromorphone Hcl] Itching  . Ivp Dye [Iodinated Diagnostic Agents] Itching  . Percocet [Oxycodone-Acetaminophen] Itching  . Toradol [Ketorolac Tromethamine] Itching    Past Medical History  Diagnosis Date  . Cancer ([place])   . Migraine   . Asthma   . Breast cancer ([place])   . Arthritis   . Back disorder     degenerative disk disease  . Pap smear for cervical cancer screening 08/12/15  . Mammogram abnormal 08/24/15    first  . GERD (gastroesophageal reflux disease)     Past Surgical History  Procedure Laterality Date  . Cholecystectomy    . Cesarean section    . Portacath placement [name] 09/12/2015    Procedure: INSERTION PORT-A-CATH;  Surgeon: Excell Seltzer, MD;  Location: [facility];  Service: General;  Laterality: [name];    Social History:  reports that she has never smoked. She has never used smokeless tobacco. She reports that she drinks alcohol. She reports that she does not use illicit drugs.  Family History:  Family History  Problem Relation Age of Onset  . Lung cancer Mother 40    2 different types of lung cancer; metastasis to brain; smoker  . Other Mother     hx of hysterectomy for unspecified reason  . Bone cancer Maternal Uncle     dx. early 49s  . Breast cancer Maternal Grandmother     dx. early 4s, s/p mastectomy  . Cancer Paternal Grandfather     unspecified type of cancer, dx. late 66s  .  Congestive Heart Failure Father     smoker  . Stroke Father   . Cirrhosis Maternal Grandfather   . Heart Problems Maternal Grandfather   . Lung cancer Other     (maternal great uncle; MGM's brother); had a coal stove  . Cancer Cousin     unspecified type; d. early age (paternal first cousin once-removed)  . Cancer Cousin     dx. as a kid; in remission today; (paternal 2nd cousin)  . Cancer Cousin     unspecified type; d.  early 40s; (maternal 1st cousin)     Prior to Admission medications   Medication Sig Start Date End Date Taking? Authorizing Provider  diphenoxylate-atropine (LOMOTIL) 2.5-0.025 MG tablet Take 2 tablets by mouth 4 (four) times daily as needed for diarrhea or loose stools. 11/17/15  Yes Susanne Borders, NP  lidocaine-prilocaine (EMLA) cream Apply to affected area as directed. 09/07/15  Yes Historical Provider, MD  LORazepam (ATIVAN) 0.5 MG tablet Take 0.5 mg by mouth at bedtime. 09/08/15  Yes Historical Provider, MD  ondansetron (ZOFRAN) 8 MG tablet Take 1 tablet (8 mg total) by mouth every 8 (eight) hours as needed for nausea or vomiting. 11/15/15  Yes Nicholas Lose, MD  PROAIR HFA 108 (90 BASE) MCG/ACT inhaler Inhale 2 puffs into the lungs every 4 (four) hours as needed. For shortness of breath 03/28/15  Yes Historical Provider, MD  traMADol (ULTRAM) 50 MG tablet Take 1 tablet (50 mg total) by mouth every 6 (six) hours as needed for moderate pain or severe pain. 11/18/15  Yes Susanne Borders, NP    Physical Exam: Filed Vitals:   12/08/15 2300 12/08/15 2333 12/09/15 0100 12/09/15 0130  BP: 111/70 127/93 143/98 152/91  Pulse: 105 104 100 97  Temp:      TempSrc:      Resp: 19 16 31 17   SpO2: 96% 96% 95% 96%   General: Not in acute distress HEENT:       Eyes: PERRL, EOMI, no scleral icterus.       ENT: No discharge from the ears and nose, no pharynx injection, no tonsillar enlargement.        Neck: No JVD, no bruit, no mass felt. Heme: No neck lymph node enlargement. Cardiac: S1/S2, RRR, No murmurs, No gallops or rubs. Pulm:  No rales, wheezing, rhonchi or rubs. Abd: Soft, nondistended, mild tenderness over RLQ, no rebound pain, no organomegaly, BS present. Ext: No pitting leg edema bilaterally. 2+DP/PT pulse bilaterally. Musculoskeletal: No joint deformities, No joint redness or warmth, no limitation of ROM in spin. Skin: No rashes.  Neuro: Alert, oriented X3, cranial nerves II-XII grossly  intact, moves all extremities normally. Psych: Patient is not psychotic, no suicidal or hemocidal ideation.  Labs on Admission:  Basic Metabolic Panel:  Recent Labs Lab 12/05/15 1352 12/08/15 1355  NA 141 138  K 3.6 3.7  CO2 26 22  GLUCOSE 111 143*  BUN 10.9 13.6  CREATININE 0.8 0.9  CALCIUM 10.1 10.4   Liver Function Tests:  Recent Labs Lab 12/05/15 1352 12/08/15 1355  AST 43* 60*  ALT 65* 98*  ALKPHOS 55 58  BILITOT <0.30 0.69  PROT 7.0 7.3  ALBUMIN 3.4* 3.5   No results for input(s): LIPASE, AMYLASE in the last 168 hours. No results for input(s): AMMONIA in the last 168 hours. CBC:  Recent Labs Lab 12/05/15 1352 12/08/15 1355  WBC 4.9 4.2  NEUTROABS 2.8 2.4  HGB 11.1* 12.4  HCT 34.2* 38.4  MCV 88.0 87.7  PLT 370 365   Cardiac Enzymes: No results for input(s): CKTOTAL, CKMB, CKMBINDEX, TROPONINI in the last 168 hours.  BNP (last 3 results) No results for input(s): BNP in the last 8760 hours.  ProBNP (last 3 results) No results for input(s): PROBNP in the last 8760 hours.  CBG: No results for input(s): GLUCAP in the last 168 hours.  Radiological Exams on Admission: Ct Abdomen Pelvis Wo Contrast  12/09/2015  CLINICAL DATA:  Right lower quadrant pain with vomiting and diarrhea. Currently receiving chemotherapy for breast carcinoma. EXAM: CT ABDOMEN AND PELVIS WITHOUT CONTRAST TECHNIQUE: Multidetector CT imaging of the abdomen and pelvis was performed following the standard protocol without IV contrast. COMPARISON:  None. FINDINGS: Lower chest:  Visualized lung bases are unremarkable. Hepatobiliary: Unenhanced appearance of the liver is unremarkable. The gallbladder has been removed. Pancreas: Normal unenhanced appearance of the pancreas. Spleen: Normal unenhanced appearance of the spleen. Adrenals/Urinary Tract: Normal appearance of the kidneys without hydronephrosis. No renal or urinary tract calculi identified. The bladder is decompressed. Stomach/Bowel:  Bowel loops show no evidence of obstruction or inflammation. No bowel lesions are seen. The appendix is well visualized and normal without evidence of appendicitis. No free air. Vascular/Lymphatic: No enlarged lymph nodes are identified. Reproductive: Uterus and adnexal regions appear unremarkable by CT with dominant cyst of the right adnexa measuring approximately 4.3 cm. This is likely physiologic. No associated free fluid in the pelvis. Other: No evidence of abscess.  No hernia identified. Musculoskeletal: Bony structures are normal and show no evidence of fracture or lesion. IMPRESSION: Unremarkable CT of the abdomen and pelvis without contrast. 4 cm right adnexal cyst is likely physiologic. Electronically Signed   By: Aletta Edouard M.D.   On: 12/09/2015 00:14    Assessment/Plan Principal Problem:   Nausea vomiting and diarrhea Active Problems:   Breast cancer of upper-outer quadrant of left female breast (HCC)   Dehydration   Chemotherapy-induced peripheral neuropathy (HCC)   Asthma   Abdominal pain   Nausea & vomiting  Nausea vomiting, diarrhea and AP: CT abdomen/pelvis is negative for acute intra-abdominal abnormalities. Likely due to chemotherapy.   -will admit to tele bed (due to tachycardia) for observation -IV fluids: Normal saline 2.5 L, then 100 mL per hour - prn Zofran for nausea, and morphine for pain -C. difficile PCR and a GI pathogen panel -pepcid IV - check Lipase  Breast cancer of upper-outer quadrant of left female breast [facility]): followed up by Dr. Lindi Adie, last seen was on 12/05/15. On chemotherapy currently. -Follow-up with Dr. Lindi Adie  Asthma: stable -continue albuterol inhaler prn  GERD: -Pepcid IV   DVT ppx: SQ Lovenox  Code Status: Full code Family Communication:  Yes, patient's husband at bed side Disposition Plan: Admit to inpatient   Date of Service 12/09/2015    Ivor Costa [facility] Pager [phone]  If 7PM-7AM, please contact  night-coverage [email or web address] Password [email or web address] 12/09/2015, 2:09 AM

## 2015-12-09 NOTE — Progress Notes (Signed)
Rx Brief note:  Lovenox  Wt=113 kg CrCl~98 ml/min, BMI=41.8  Rx adjusted Lovenox to 60mg  (~0.5 mg/kg) daily in pt with BMI>30  Thanks Dorrene German 12/09/2015 2:48 AM

## 2015-12-10 DIAGNOSIS — D72819 Decreased white blood cell count, unspecified: Secondary | ICD-10-CM

## 2015-12-10 DIAGNOSIS — R197 Diarrhea, unspecified: Secondary | ICD-10-CM

## 2015-12-10 DIAGNOSIS — D6481 Anemia due to antineoplastic chemotherapy: Secondary | ICD-10-CM

## 2015-12-10 DIAGNOSIS — T451X5A Adverse effect of antineoplastic and immunosuppressive drugs, initial encounter: Secondary | ICD-10-CM

## 2015-12-10 DIAGNOSIS — E860 Dehydration: Secondary | ICD-10-CM

## 2015-12-10 LAB — CBC WITH DIFFERENTIAL/PLATELET
BASOS PCT: 1 %
Basophils Absolute: 0 10*3/uL (ref 0.0–0.1)
EOS ABS: 0.1 10*3/uL (ref 0.0–0.7)
Eosinophils Relative: 5 %
HEMATOCRIT: 31.4 % — AB (ref 36.0–46.0)
Hemoglobin: 10.6 g/dL — ABNORMAL LOW (ref 12.0–15.0)
Lymphocytes Relative: 41 %
Lymphs Abs: 1.2 10*3/uL (ref 0.7–4.0)
MCH: 29.2 pg (ref 26.0–34.0)
MCHC: 33.8 g/dL (ref 30.0–36.0)
MCV: 86.5 fL (ref 78.0–100.0)
MONO ABS: 0.3 10*3/uL (ref 0.1–1.0)
MONOS PCT: 10 %
Neutro Abs: 1.2 10*3/uL — ABNORMAL LOW (ref 1.7–7.7)
Neutrophils Relative %: 43 %
Platelets: 310 10*3/uL (ref 150–400)
RBC: 3.63 MIL/uL — ABNORMAL LOW (ref 3.87–5.11)
RDW: 19.7 % — AB (ref 11.5–15.5)
WBC: 2.8 10*3/uL — ABNORMAL LOW (ref 4.0–10.5)

## 2015-12-10 LAB — BASIC METABOLIC PANEL
Anion gap: 7 (ref 5–15)
BUN: 7 mg/dL (ref 6–20)
CALCIUM: 9.6 mg/dL (ref 8.9–10.3)
CO2: 24 mmol/L (ref 22–32)
CREATININE: 0.67 mg/dL (ref 0.44–1.00)
Chloride: 107 mmol/L (ref 101–111)
GFR calc non Af Amer: >60 mL/min (ref >60)
Glucose, Bld: 97 mg/dL (ref 65–99)
Potassium: 3.7 mmol/L (ref 3.5–5.1)
SODIUM: 138 mmol/L (ref 135–145)

## 2015-12-10 LAB — GLUCOSE, CAPILLARY: Glucose-Capillary: 87 mg/dL (ref 65–99)

## 2015-12-10 MED ORDER — LOPERAMIDE HCL 2 MG PO CAPS
2.0000 mg | ORAL_CAPSULE | ORAL | Status: DC | PRN
  Administered 2015-12-10: 2 mg via ORAL
  Filled 2015-12-10: qty 1

## 2015-12-10 MED ORDER — SODIUM CHLORIDE 0.9 % IV SOLN
8.0000 mg | Freq: Three times a day (TID) | INTRAVENOUS | Status: DC
  Administered 2015-12-10 – 2015-12-11 (×3): 8 mg via INTRAVENOUS
  Filled 2015-12-10 (×4): qty 4

## 2015-12-10 NOTE — Progress Notes (Signed)
Progress Note   Cindy Bradshaw [id number] DOB: 07/30/1971 DOA: 12/08/2015 PCP: [name], MD   Brief Narrative:   Cindy Bradshaw. Dunckel is an 45 y.o. female with a PMH of asthma, GERD, anxiety, migraine headaches, obesity, and triple negative left breast cancer on neoadjuvant chemotherapy with weekly Taxol and carboplatin, who was admitted 12/08/15 with chief complaint of nausea, vomiting, diarrhea and abdominal pain. Upon initial evaluation in the ED, WBC was 4.2. No significant electrolyte or renal impairment. CT of the abdomen and pelvis was negative for acute intra-abdominal findings.  Assessment/Plan:   Principal Problem:   Nausea vomiting and diarrhea/abdominal pain/Dehydration Admitted under observation status for IV hydration, antiemetics and supportive care. Suspect this is chemotherapy-induced enteritis. GI pathogen panel and C. difficile studies negative.   Active Problems:   Leukopenia/anemia Chemotherapy induced. Monitor counts.    Transaminitis Monitor.    Breast cancer of upper-outer quadrant of left female breast ([place]) Received Taxol and carboplatin on 12/05/15. Has triple negative disease.Outpatient follow up.    Chemotherapy-induced peripheral neuropathy (HCC) Tramadol ordered as needed.    Asthma Albuterol ordered as needed.    DVT Prophylaxis Lovenox ordered.   Family Communication/Anticipated D/C date and plan/Code Status   Family Communication: Updated patient, no family at bedside. Disposition Plan/date: Home when nausea, vomiting and diarrhea improved, possibly 1-2 days. Code Status: Full code.   Procedures and diagnostic studies:   Ct Abdomen Pelvis Wo Contrast  12/09/2015  CLINICAL DATA:  Right lower quadrant pain with vomiting and diarrhea. Currently receiving chemotherapy for breast carcinoma. EXAM: CT ABDOMEN AND PELVIS WITHOUT CONTRAST TECHNIQUE: Multidetector CT imaging of the abdomen and pelvis was performed following the  standard protocol without IV contrast. COMPARISON:  None. FINDINGS: Lower chest:  Visualized lung bases are unremarkable. Hepatobiliary: Unenhanced appearance of the liver is unremarkable. The gallbladder has been removed. Pancreas: Normal unenhanced appearance of the pancreas. Spleen: Normal unenhanced appearance of the spleen. Adrenals/Urinary Tract: Normal appearance of the kidneys without hydronephrosis. No renal or urinary tract calculi identified. The bladder is decompressed. Stomach/Bowel: Bowel loops show no evidence of obstruction or inflammation. No bowel lesions are seen. The appendix is well visualized and normal without evidence of appendicitis. No free air. Vascular/Lymphatic: No enlarged lymph nodes are identified. Reproductive: Uterus and adnexal regions appear unremarkable by CT with dominant cyst of the right adnexa measuring approximately 4.3 cm. This is likely physiologic. No associated free fluid in the pelvis. Other: No evidence of abscess.  No hernia identified. Musculoskeletal: Bony structures are normal and show no evidence of fracture or lesion. IMPRESSION: Unremarkable CT of the abdomen and pelvis without contrast. 4 cm right adnexal cyst is likely physiologic. Electronically Signed   By: Aletta Edouard M.D.   On: 12/09/2015 00:14    Medical Consultants:    None.  Anti-Infectives:   Anti-infectives    None      Subjective:   Cindy Bradshaw is still nauseated and bout of emesis this morning.  Has only been able to tolerate sips of water.  No further diarrhea.  Objective:    Filed Vitals:   12/09/15 0327 12/09/15 1345 12/09/15 2148 12/10/15 0501  BP: 130/76 129/70 133/74 128/81  Pulse: 91 99 101 94  Temp: 98.7 F (37.1 C) 97.9 F (36.6 C) 98.7 F (37.1 C) 98.4 F (36.9 C)  TempSrc: Oral Oral Oral Oral  Resp:  18 18 20   Height: 5\' 5"  (1.651 m)     Weight: 117.1  kg (258 lb 2.5 oz)     SpO2: 99% 99% 100% 99%    Intake/Output Summary (Last 24 hours) at  12/10/15 1104 Last data filed at 12/10/15 0700  Gross per 24 hour  Intake   2850 ml  Output      0 ml  Net   2850 ml   Filed Weights   12/09/15 0327  Weight: 117.1 kg (258 lb 2.5 oz)    Exam: Gen:  Weak, alopecia present, Porta-cath right chest wall Cardiovascular:  RRR, No M/R/G Respiratory:  Lungs CTAB Gastrointestinal:  Abdomen soft, NT/ND, + BS Extremities:  No C/c/e   Data Reviewed:    Labs: Basic Metabolic Panel:  Recent Labs Lab 12/05/15 1352 12/08/15 1355 12/09/15 0452 12/10/15 0500  NA 141 138 141 138  K 3.6 3.7 3.6 3.7  CL  --   --  112* 107  CO2 26 22 24 24   GLUCOSE 111 143* 109* 97  BUN 10.9 13.6 11 7   CREATININE 0.8 0.9 0.58 0.67  CALCIUM 10.1 10.4 9.2 9.6   GFR Estimated Creatinine Clearance: 112.4 mL/min (by C-G formula based on Cr of 0.67). Liver Function Tests:  Recent Labs Lab 12/05/15 1352 12/08/15 1355 12/09/15 0452  AST 43* 60* 178*  ALT 65* 98* 142*  ALKPHOS 55 58 53  BILITOT <0.30 0.69 1.0  PROT 7.0 7.3 6.2*  ALBUMIN 3.4* 3.5 3.4*    Recent Labs Lab 12/09/15 0452  LIPASE 31   Coagulation profile  Recent Labs Lab 12/09/15 0452  INR 1.05    CBC:  Recent Labs Lab 12/05/15 1352 12/08/15 1355 12/09/15 0452 12/10/15 0500  WBC 4.9 4.2 2.8* 2.8*  NEUTROABS 2.8 2.4  --  1.2*  HGB 11.1* 12.4 10.7* 10.6*  HCT 34.2* 38.4 32.6* 31.4*  MCV 88.0 87.7 87.9 86.5  PLT 370 365 307 310   CBG:  Recent Labs Lab 12/09/15 0737 12/10/15 0729  GLUCAP 96 87   Microbiology Recent Results (from the past 240 hour(s))  Influenza A and B     Status: None   Collection Time: 12/08/15  4:24 PM  Result Value Ref Range Status   Influenza A Ag, EIA Negative Negative Final   Influenza B Ag, EIA Negative Negative Final   Influenza Comment See note  Final   Please note: Comment  Final    Comment: Because a negative rapid influenza antigen EIA test may not rule-out influenza viral infection, the CDC and other public health  agencies have recommended that confirmatory testing using viral culture or nucleic acid amplification should be considered when clinical signs and symptoms are consistent with influenza-like illness, as well as to assist in detecting other respiratory viruses that can produce similar clinical symptoms. (Reference: https://lee-mcguire.com/ guidance_ridt.pdf)   C difficile quick scan w PCR reflex     Status: None   Collection Time: 12/09/15 12:32 PM  Result Value Ref Range Status   C Diff antigen NEGATIVE NEGATIVE Final   C Diff toxin NEGATIVE NEGATIVE Final   C Diff interpretation Negative for toxigenic C. difficile  Final  Gastrointestinal Panel by PCR , Stool     Status: None   Collection Time: 12/09/15 12:32 PM  Result Value Ref Range Status   Campylobacter species NOT DETECTED NOT DETECTED Final   Plesimonas shigelloides NOT DETECTED NOT DETECTED Final   Salmonella species NOT DETECTED NOT DETECTED Final   Yersinia enterocolitica NOT DETECTED NOT DETECTED Final   Vibrio species NOT DETECTED NOT DETECTED Final  Vibrio cholerae NOT DETECTED NOT DETECTED Final   Enteroaggregative E coli (EAEC) NOT DETECTED NOT DETECTED Final   Enteropathogenic E coli (EPEC) NOT DETECTED NOT DETECTED Final   Enterotoxigenic E coli (ETEC) NOT DETECTED NOT DETECTED Final   Shiga like toxin producing E coli (STEC) NOT DETECTED NOT DETECTED Final   E. coli O157 NOT DETECTED NOT DETECTED Final   Shigella/Enteroinvasive E coli (EIEC) NOT DETECTED NOT DETECTED Final   Cryptosporidium NOT DETECTED NOT DETECTED Final   Cyclospora cayetanensis NOT DETECTED NOT DETECTED Final   Entamoeba histolytica NOT DETECTED NOT DETECTED Final   Giardia lamblia NOT DETECTED NOT DETECTED Final   Adenovirus F40/41 NOT DETECTED NOT DETECTED Final   Astrovirus NOT DETECTED NOT DETECTED Final   Norovirus GI/GII NOT DETECTED NOT DETECTED Final   Rotavirus A NOT DETECTED NOT DETECTED Final    Sapovirus (I, II, IV, and V) NOT DETECTED NOT DETECTED Final     Medications:   . enoxaparin (LOVENOX) injection  60 mg Subcutaneous Daily  . famotidine (PEPCID) IV  20 mg Intravenous Q12H  . LORazepam  0.5 mg Oral QHS  . ondansetron (ZOFRAN) IV  8 mg Intravenous 3 times per day  . sodium chloride flush  3 mL Intravenous Q12H   Continuous Infusions: . sodium chloride 100 mL/hr at 12/10/15 0409    Time spent: 35 minutes.   LOS: 1 day   [facility] MD  [facility] Pager [phone].   *Please refer to [email or web address], password [email or web address] to get updated schedule on who will round on this patient, as hospitalists switch teams weekly. If 7PM-7AM, please contact night-coverage at [email or web address], password [email or web address] for any overnight needs.  12/10/2015, 11:04 AM

## 2015-12-11 DIAGNOSIS — R1110 Vomiting, unspecified: Secondary | ICD-10-CM

## 2015-12-11 LAB — GLUCOSE, CAPILLARY: GLUCOSE-CAPILLARY: 85 mg/dL (ref 65–99)

## 2015-12-11 LAB — CBC
HCT: 31.8 % — ABNORMAL LOW (ref 36.0–46.0)
Hemoglobin: 10.5 g/dL — ABNORMAL LOW (ref 12.0–15.0)
MCH: 29.2 pg (ref 26.0–34.0)
MCHC: 33 g/dL (ref 30.0–36.0)
MCV: 88.6 fL (ref 78.0–100.0)
PLATELETS: 303 10*3/uL (ref 150–400)
RBC: 3.59 MIL/uL — AB (ref 3.87–5.11)
RDW: 19.7 % — AB (ref 11.5–15.5)
WBC: 2.9 10*3/uL — ABNORMAL LOW (ref 4.0–10.5)

## 2015-12-11 LAB — BASIC METABOLIC PANEL
Anion gap: 6 (ref 5–15)
BUN: 7 mg/dL (ref 6–20)
CALCIUM: 9.7 mg/dL (ref 8.9–10.3)
CO2: 26 mmol/L (ref 22–32)
CREATININE: 0.67 mg/dL (ref 0.44–1.00)
Chloride: 109 mmol/L (ref 101–111)
GFR calc non Af Amer: >60 mL/min (ref >60)
GLUCOSE: 93 mg/dL (ref 65–99)
Potassium: 3.7 mmol/L (ref 3.5–5.1)
Sodium: 141 mmol/L (ref 135–145)

## 2015-12-11 MED ORDER — SODIUM CHLORIDE 0.9 % IV SOLN
8.0000 mg | Freq: Three times a day (TID) | INTRAVENOUS | Status: DC | PRN
  Filled 2015-12-11: qty 4

## 2015-12-11 NOTE — Progress Notes (Signed)
Report received from A. Helsabe[place],RN. No change in assessment. Continue plan of care. Berton Butrick Johnson 

## 2015-12-11 NOTE — Progress Notes (Signed)
Progress Note   Cindy K. [name] MZ:[id number] DOB: October 20, 1970 DOA: 12/08/2015 PCP: [name], MD   Brief Narrative:   Cindy Bradshaw is an 45 y.o. female with a PMH of asthma, GERD, anxiety, migraine headaches, obesity, and triple negative left breast cancer on neoadjuvant chemotherapy with weekly Taxol and carboplatin, who was admitted 12/08/15 with chief complaint of nausea, vomiting, diarrhea and abdominal pain. Upon initial evaluation in the ED, WBC was 4.2. No significant electrolyte or renal impairment. CT of the abdomen and pelvis was negative for acute intra-abdominal findings.  Assessment/Plan:   Principal Problem:   Nausea vomiting and diarrhea/abdominal pain/Dehydration Admitted under observation status for IV hydration, antiemetics and supportive care. Suspect this is chemotherapy-induced enteritis. GI pathogen panel and C. difficile studies negative. Patient with clinical improvement. Place on full liquid diet today and a soft diet for breakfast.  Active Problems:   Leukopenia/anemia Chemotherapy induced. Monitor counts.    Transaminitis Monitor.    Breast cancer of upper-outer quadrant of left female breast ([place]) Received Taxol and carboplatin on 12/05/15. Has triple negative disease.Outpatient follow up.    Chemotherapy-induced peripheral neuropathy (HCC) Tramadol ordered as needed.    Asthma Albuterol ordered as needed.    DVT Prophylaxis Lovenox ordered.   Family Communication/Anticipated D/C date and plan/Code Status   Family Communication: Updated patient and husband at bedside. Disposition Plan/date: Home when nausea, vomiting and diarrhea improved, possibly 1-2 days. Code Status: Full code.   Procedures and diagnostic studies:   Ct Abdomen Pelvis Wo Contrast  12/09/2015  CLINICAL DATA:  Right lower quadrant pain with vomiting and diarrhea. Currently receiving chemotherapy for breast carcinoma. EXAM: CT ABDOMEN AND PELVIS WITHOUT  CONTRAST TECHNIQUE: Multidetector CT imaging of the abdomen and pelvis was performed following the standard protocol without IV contrast. COMPARISON:  None. FINDINGS: Lower chest:  Visualized lung bases are unremarkable. Hepatobiliary: Unenhanced appearance of the liver is unremarkable. The gallbladder has been removed. Pancreas: Normal unenhanced appearance of the pancreas. Spleen: Normal unenhanced appearance of the spleen. Adrenals/Urinary Tract: Normal appearance of the kidneys without hydronephrosis. No renal or urinary tract calculi identified. The bladder is decompressed. Stomach/Bowel: Bowel loops show no evidence of obstruction or inflammation. No bowel lesions are seen. The appendix is well visualized and normal without evidence of appendicitis. No free air. Vascular/Lymphatic: No enlarged lymph nodes are identified. Reproductive: Uterus and adnexal regions appear unremarkable by CT with dominant cyst of the right adnexa measuring approximately 4.3 cm. This is likely physiologic. No associated free fluid in the pelvis. Other: No evidence of abscess.  No hernia identified. Musculoskeletal: Bony structures are normal and show no evidence of fracture or lesion. IMPRESSION: Unremarkable CT of the abdomen and pelvis without contrast. 4 cm right adnexal cyst is likely physiologic. Electronically Signed   By: Aletta Edouard M.D.   On: 12/09/2015 00:14    Medical Consultants:    None.  Anti-Infectives:   Anti-infectives    None      Subjective:   Cindy Bradshaw  States nausea improved, no emesis. Tolerating clears. No further diarrhea.  Objective:    Filed Vitals:   12/10/15 0501 12/10/15 1324 12/10/15 2139 12/11/15 0526  BP: 128/81 127/65 139/89 132/77  Pulse: 94 86 103 98  Temp: 98.4 F (36.9 C) 97.8 F (36.6 C) 99.5 F (37.5 C) 98.5 F (36.9 C)  TempSrc: Oral Oral Oral Oral  Resp: 20 16 20 16   Height:      Weight:  SpO2: 99% 100% 99% 100%    Intake/Output Summary  (Last 24 hours) at 12/11/15 1151 Last data filed at 12/11/15 1042  Gross per 24 hour  Intake   3254 ml  Output      0 ml  Net   3254 ml   Filed Weights   12/09/15 0327  Weight: 117.1 kg (258 lb 2.5 oz)    Exam: Gen:  Weak, alopecia present, Porta-cath right chest wall Cardiovascular:  RRR, No M/R/G Respiratory:  Lungs CTAB Gastrointestinal:  Abdomen soft, NT/ND, + BS Extremities:  No C/c/e   Data Reviewed:    Labs: Basic Metabolic Panel:  Recent Labs Lab 12/05/15 1352 12/08/15 1355  12/09/15 0452 12/10/15 0500 12/11/15 0350  NA 141 138  --  141 138 141  K 3.6 3.7  < > 3.6 3.7 3.7  CL  --   --   --  112* 107 109  CO2 26 22  --  24 24 26   GLUCOSE 111 143*  --  109* 97 93  BUN 10.9 13.6  --  11 7 7   CREATININE 0.8 0.9  --  0.58 0.67 0.67  CALCIUM 10.1 10.4  --  9.2 9.6 9.7  < > = values in this interval not displayed. GFR Estimated Creatinine Clearance: 112.4 mL/min (by C-G formula based on Cr of 0.67). Liver Function Tests:  Recent Labs Lab 12/05/15 1352 12/08/15 1355 12/09/15 0452  AST 43* 60* 178*  ALT 65* 98* 142*  ALKPHOS 55 58 53  BILITOT <0.30 0.69 1.0  PROT 7.0 7.3 6.2*  ALBUMIN 3.4* 3.5 3.4*    Recent Labs Lab 12/09/15 0452  LIPASE 31   Coagulation profile  Recent Labs Lab 12/09/15 0452  INR 1.05    CBC:  Recent Labs Lab 12/05/15 1352 12/08/15 1355 12/09/15 0452 12/10/15 0500 12/11/15 0350  WBC 4.9 4.2 2.8* 2.8* 2.9*  NEUTROABS 2.8 2.4  --  1.2*  --   HGB 11.1* 12.4 10.7* 10.6* 10.5*  HCT 34.2* 38.4 32.6* 31.4* 31.8*  MCV 88.0 87.7 87.9 86.5 88.6  PLT 370 365 307 310 303   CBG:  Recent Labs Lab 12/09/15 0737 12/10/15 0729 12/11/15 0720  GLUCAP 96 87 85   Microbiology Recent Results (from the past 240 hour(s))  Influenza A and B     Status: None   Collection Time: 12/08/15  4:24 PM  Result Value Ref Range Status   Influenza A Ag, EIA Negative Negative Final   Influenza B Ag, EIA Negative Negative Final    Influenza Comment See note  Final   Please note: Comment  Final    Comment: Because a negative rapid influenza antigen EIA test may not rule-out influenza viral infection, the CDC and other public health agencies have recommended that confirmatory testing using viral culture or nucleic acid amplification should be considered when clinical signs and symptoms are consistent with influenza-like illness, as well as to assist in detecting other respiratory viruses that can produce similar clinical symptoms. (Reference: https://lee-mcguire.com/ guidance_ridt.pdf)   C difficile quick scan w PCR reflex     Status: None   Collection Time: 12/09/15 12:32 PM  Result Value Ref Range Status   C Diff antigen NEGATIVE NEGATIVE Final   C Diff toxin NEGATIVE NEGATIVE Final   C Diff interpretation Negative for toxigenic C. difficile  Final  Gastrointestinal Panel by PCR , Stool     Status: None   Collection Time: 12/09/15 12:32 PM  Result Value Ref Range  Status   Campylobacter species NOT DETECTED NOT DETECTED Final   Plesimonas shigelloides NOT DETECTED NOT DETECTED Final   Salmonella species NOT DETECTED NOT DETECTED Final   Yersinia enterocolitica NOT DETECTED NOT DETECTED Final   Vibrio species NOT DETECTED NOT DETECTED Final   Vibrio cholerae NOT DETECTED NOT DETECTED Final   Enteroaggregative E coli (EAEC) NOT DETECTED NOT DETECTED Final   Enteropathogenic E coli (EPEC) NOT DETECTED NOT DETECTED Final   Enterotoxigenic E coli (ETEC) NOT DETECTED NOT DETECTED Final   Shiga like toxin producing E coli (STEC) NOT DETECTED NOT DETECTED Final   E. coli O157 NOT DETECTED NOT DETECTED Final   Shigella/Enteroinvasive E coli (EIEC) NOT DETECTED NOT DETECTED Final   Cryptosporidium NOT DETECTED NOT DETECTED Final   Cyclospora cayetanensis NOT DETECTED NOT DETECTED Final   Entamoeba histolytica NOT DETECTED NOT DETECTED Final   Giardia lamblia NOT DETECTED NOT DETECTED  Final   Adenovirus F40/41 NOT DETECTED NOT DETECTED Final   Astrovirus NOT DETECTED NOT DETECTED Final   Norovirus GI/GII NOT DETECTED NOT DETECTED Final   Rotavirus A NOT DETECTED NOT DETECTED Final   Sapovirus (I, II, IV, and V) NOT DETECTED NOT DETECTED Final     Medications:   . enoxaparin (LOVENOX) injection  60 mg Subcutaneous Daily  . famotidine (PEPCID) IV  20 mg Intravenous Q12H  . LORazepam  0.5 mg Oral QHS  . ondansetron (ZOFRAN) IV  8 mg Intravenous 3 times per day  . sodium chloride flush  3 mL Intravenous Q12H   Continuous Infusions: . sodium chloride 100 mL/hr at 12/11/15 0147    Time spent: 35 minutes.   LOS: 2 days   [facility] MD  [facility] Pager [phone].   *Please refer to [email or web address], password [email or web address] to get updated schedule on who will round on this patient, as hospitalists switch teams weekly. If 7PM-7AM, please contact night-coverage at [email or web address], password [email or web address] for any overnight needs.  12/11/2015, 11:51 AM

## 2015-12-12 ENCOUNTER — Encounter: Payer: Self-pay | Admitting: Nurse Practitioner

## 2015-12-12 ENCOUNTER — Other Ambulatory Visit: Payer: Self-pay | Admitting: *Deleted

## 2015-12-12 ENCOUNTER — Telehealth: Payer: Self-pay | Admitting: *Deleted

## 2015-12-12 DIAGNOSIS — R1031 Right lower quadrant pain: Secondary | ICD-10-CM

## 2015-12-12 DIAGNOSIS — N949 Unspecified condition associated with female genital organs and menstrual cycle: Secondary | ICD-10-CM | POA: Diagnosis present

## 2015-12-12 LAB — BASIC METABOLIC PANEL
ANION GAP: 7 (ref 5–15)
BUN: 6 mg/dL (ref 6–20)
CALCIUM: 9.6 mg/dL (ref 8.9–10.3)
CO2: 24 mmol/L (ref 22–32)
Chloride: 110 mmol/L (ref 101–111)
Creatinine, Ser: 0.61 mg/dL (ref 0.44–1.00)
GFR calc Af Amer: >60 mL/min (ref >60)
GLUCOSE: 96 mg/dL (ref 65–99)
Potassium: 3.7 mmol/L (ref 3.5–5.1)
SODIUM: 141 mmol/L (ref 135–145)

## 2015-12-12 LAB — GLUCOSE, CAPILLARY: GLUCOSE-CAPILLARY: 88 mg/dL (ref 65–99)

## 2015-12-12 MED ORDER — TRAMADOL HCL 50 MG PO TABS: 50.0000 mg | ORAL_TABLET | Freq: Four times a day (QID) | ORAL | Status: DC | PRN

## 2015-12-12 MED ORDER — HEPARIN SOD (PORK) LOCK FLUSH 100 UNIT/ML IV SOLN
500.0000 [IU] | INTRAVENOUS | Status: DC | PRN
  Administered 2015-12-12: 500 [IU]
  Filled 2015-12-12: qty 5

## 2015-12-12 MED ORDER — HEPARIN SOD (PORK) LOCK FLUSH 100 UNIT/ML IV SOLN
500.0000 [IU] | INTRAVENOUS | Status: DC
  Filled 2015-12-12: qty 5

## 2015-12-12 NOTE — Assessment & Plan Note (Signed)
Patient received her last cycle of chemotherapy on 12/05/2015.  She has been experiencing chronic nausea/vomiting and subsequent dehydration.  Since that time.  She has tried taking her anti--nausea medication at home with only minimal effectiveness.  Patient received IV fluid rehydration today while at the cancer center; as well as Zofran and Ativan.  However, patient continued with intractable nausea and vomiting.  Patient will be transported to the emergency department for further evaluation and management of intractable nausea/vomiting and dehydration.  Brief history.  Report were called to the emergency department charge nurse; prior to the patient being transported to the emergency department via wheelchair per the [facility].  Note: Patient is considered a full code.

## 2015-12-12 NOTE — Progress Notes (Signed)
SYMPTOM MANAGEMENT CLINIC    Chief Complaint: Nausea/vomiting/dehydration.   HPI:  Cindy Bradshaw 45 y.o. female diagnosed with breast cancer.  Currently undergoing Taxol/carboplatin chemotherapy regimen.   Patient received her last cycle of chemotherapy on 12/05/2015.  She has been experiencing chronic nausea/vomiting and subsequent dehydration.  Since that time.  She has tried taking her anti--nausea medication at home with only minimal effectiveness.  Patient received IV fluid rehydration today while at the cancer center; as well as Zofran and Ativan.  However, patient continued with intractable nausea and vomiting.  Patient will be transported to the emergency department for further evaluation and management of intractable nausea/vomiting and dehydration.  Brief history.  Report were called to the emergency department charge nurse; prior to the patient being transported to the emergency department via wheelchair per the [facility].  Note: Patient is considered a full code.    Breast cancer of upper-outer quadrant of left female breast ([place])   08/17/2015 Initial Diagnosis left breast biopsy 1:30 position: Invasive ductal carcinoma, grade 3, ER 0%, PR 0%, HER-2 negative ratio 1.48, Ki-67 90%, 3.1 cm tumor, axilla negative, T2 N0 stage II a clinical stage   09/19/2015 -  Neo-Adjuvant Chemotherapy Neoadjuvant chemotherapy with dose dense Adriamycin and Cytoxan 4 followed by Taxol and carboplatin weekly 12   09/28/2015 Procedure  Left breast biopsy anterior third left upper outer quadrant: PASH;  upper inner quadrant: PASH    Review of Systems  Constitutional: Positive for malaise/fatigue.  Gastrointestinal: Positive for nausea and vomiting. Negative for abdominal pain and diarrhea.  All other systems reviewed and are negative.   Past Medical History  Diagnosis Date  . Cancer ([place])   . Migraine   . Asthma   . Breast cancer ([place])   . Arthritis   . Back disorder    degenerative disk disease  . Pap smear for cervical cancer screening 08/12/15  . Mammogram abnormal 08/24/15    first  . GERD (gastroesophageal reflux disease)     Past Surgical History  Procedure Laterality Date  . Cholecystectomy    . Cesarean section    . Portacath placement [name] 09/12/2015    Procedure: INSERTION PORT-A-CATH;  Surgeon: Excell Seltzer, MD;  Location: [facility];  Service: General;  Laterality: [name];    has Breast cancer of upper-outer quadrant of left female breast ([place]); Normocytic normochromic anemia; Family history of breast cancer in female; Dehydration; Chemotherapy induced nausea and vomiting; Genetic testing; Chemotherapy-induced peripheral neuropathy ([place]); Asthma; Nausea vomiting and diarrhea; Abdominal pain; Antineoplastic chemotherapy induced anemia; Leukopenia due to antineoplastic chemotherapy; Vomiting and diarrhea; and Nausea & vomiting on her problem list.    is allergic to dilaudid; ivp dye; percocet; and toradol.    Medication List       This list is accurate as of: 12/08/15  5:42 PM.  Always use your most recent med list.               diphenoxylate-atropine 2.5-0.025 MG tablet  Commonly known as:  LOMOTIL  Take 2 tablets by mouth 4 (four) times daily as needed for diarrhea or loose stools.     lidocaine-prilocaine cream  Commonly known as:  EMLA  Apply to affected area as directed.     LORazepam 0.5 MG tablet  Commonly known as:  ATIVAN  Take 0.5 mg by mouth at bedtime.     ondansetron 8 MG tablet  Commonly known as:  ZOFRAN  Take 1 tablet (8 mg total) by  mouth every 8 (eight) hours as needed for nausea or vomiting.     PROAIR HFA 108 (90 Base) MCG/ACT inhaler  Generic drug:  albuterol  Inhale 2 puffs into the lungs every 4 (four) hours as needed. For shortness of breath     prochlorperazine 10 MG tablet  Commonly known as:  COMPAZINE  Reported on 11/17/2015     traMADol 50 MG tablet  Commonly known as:  ULTRAM  Take 1 tablet (50  mg total) by mouth every 6 (six) hours as needed for moderate pain or severe pain.         PHYSICAL EXAMINATION  Oncology Vitals 12/11/2015 12/11/2015  Height - -  Weight - -  Weight (lbs) - -  BMI (kg/m2) - -  Temp 98.1 98.8  Pulse 90 98  Resp 18 16  SpO2 100 100  BSA (m2) - -   BP Readings from Last 2 Encounters:  12/11/15 134/76  12/08/15 117/86    Physical Exam  Constitutional: She is oriented to person, place, and time and well-developed, well-nourished, and in no distress.  HENT:  Head: Normocephalic and atraumatic.  Mouth/Throat: Oropharynx is clear and moist.  Eyes: Conjunctivae and EOM are normal. Pupils are equal, round, and reactive to light. Right eye exhibits no discharge. Left eye exhibits no discharge. No scleral icterus.  Neck: Normal range of motion. Neck supple. No JVD present. No tracheal deviation present. No thyromegaly present.  Cardiovascular: Normal rate, regular rhythm, normal heart sounds and intact distal pulses.   Pulmonary/Chest: Effort normal and breath sounds normal. No respiratory distress. She has no wheezes. She has no rales. She exhibits no tenderness.  Abdominal: Soft. Bowel sounds are normal. She exhibits no distension and no mass. There is no tenderness. There is no rebound and no guarding.  Musculoskeletal: Normal range of motion. She exhibits no edema or tenderness.  Lymphadenopathy:    She has no cervical adenopathy.  Neurological: She is alert and oriented to person, place, and time. Gait normal.  Skin: Skin is warm and dry. No rash noted. No erythema. No pallor.  Psychiatric: Affect normal.    LABORATORY DATA:. Admission on 12/08/2015  Component Date Value Ref Range Status  . I-stat hCG, quantitative 12/09/2015 <5.0  <5 mIU/mL Final  . Comment 3 12/09/2015          Final   Comment:   GEST. AGE      CONC.  (mIU/mL)   <=1 WEEK        5 - 50     2 WEEKS       50 - 500     3 WEEKS       100 - 10,000     4 WEEKS     1,000 - 30,000         FEMALE AND NON-PREGNANT FEMALE:     LESS THAN 5 mIU/mL   . C Diff antigen 12/09/2015 NEGATIVE  NEGATIVE Final  . C Diff toxin 12/09/2015 NEGATIVE  NEGATIVE Final  . C Diff interpretation 12/09/2015 Negative for toxigenic C. difficile   Final  . Campylobacter species 12/09/2015 NOT DETECTED  NOT DETECTED Final  . Plesimonas shigelloides 12/09/2015 NOT DETECTED  NOT DETECTED Final  . Salmonella species 12/09/2015 NOT DETECTED  NOT DETECTED Final  . Yersinia enterocolitica 12/09/2015 NOT DETECTED  NOT DETECTED Final  . Vibrio species 12/09/2015 NOT DETECTED  NOT DETECTED Final  . Vibrio cholerae 12/09/2015 NOT DETECTED  NOT DETECTED Final  .  Enteroaggregative E coli (EAEC) 12/09/2015 NOT DETECTED  NOT DETECTED Final  . Enteropathogenic E coli (EPEC) 12/09/2015 NOT DETECTED  NOT DETECTED Final  . Enterotoxigenic E coli (ETEC) 12/09/2015 NOT DETECTED  NOT DETECTED Final  . Shiga like toxin producing E coli * 12/09/2015 NOT DETECTED  NOT DETECTED Final  . E. coli O157 12/09/2015 NOT DETECTED  NOT DETECTED Final  . Shigella/Enteroinvasive E coli (EI* 12/09/2015 NOT DETECTED  NOT DETECTED Final  . Cryptosporidium 12/09/2015 NOT DETECTED  NOT DETECTED Final  . Cyclospora cayetanensis 12/09/2015 NOT DETECTED  NOT DETECTED Final  . Entamoeba histolytica 12/09/2015 NOT DETECTED  NOT DETECTED Final  . Giardia lamblia 12/09/2015 NOT DETECTED  NOT DETECTED Final  . Adenovirus F40/41 12/09/2015 NOT DETECTED  NOT DETECTED Final  . Astrovirus 12/09/2015 NOT DETECTED  NOT DETECTED Final  . Norovirus GI/GII 12/09/2015 NOT DETECTED  NOT DETECTED Final  . Rotavirus A 12/09/2015 NOT DETECTED  NOT DETECTED Final  . Sapovirus (I, II, IV, and V) 12/09/2015 NOT DETECTED  NOT DETECTED Final  . Lipase 12/09/2015 31  11 - 51 U/L Final  . Prothrombin Time 12/09/2015 13.5  11.6 - 15.2 seconds Final  . INR 12/09/2015 1.05  0.00 - 1.49 Final  . Sodium 12/09/2015 141  135 - 145 mmol/L Final  . Potassium 12/09/2015  3.6  3.5 - 5.1 mmol/L Final  . Chloride 12/09/2015 112* 101 - 111 mmol/L Final  . CO2 12/09/2015 24  22 - 32 mmol/L Final  . Glucose, Bld 12/09/2015 109* 65 - 99 mg/dL Final  . BUN 12/09/2015 11  6 - 20 mg/dL Final  . Creatinine, Ser 12/09/2015 0.58  0.44 - 1.00 mg/dL Final  . Calcium 12/09/2015 9.2  8.9 - 10.3 mg/dL Final  . Total Protein 12/09/2015 6.2* 6.5 - 8.1 g/dL Final  . Albumin 12/09/2015 3.4* 3.5 - 5.0 g/dL Final  . AST 12/09/2015 178* 15 - 41 U/L Final  . ALT 12/09/2015 142* 14 - 54 U/L Final  . Alkaline Phosphatase 12/09/2015 53  38 - 126 U/L Final  . Total Bilirubin 12/09/2015 1.0  0.3 - 1.2 mg/dL Final  . GFR calc non Af Amer 12/09/2015 >60  >60 mL/min Final  . GFR calc Af Amer 12/09/2015 >60  >60 mL/min Final   Comment: (NOTE) The eGFR has been calculated using the CKD EPI equation. This calculation has not been validated in all clinical situations. eGFR's persistently <60 mL/min signify possible Chronic Kidney Disease.   . Anion gap 12/09/2015 5  5 - 15 Final  . WBC 12/09/2015 2.8* 4.0 - 10.5 K/uL Final  . RBC 12/09/2015 3.71* 3.87 - 5.11 MIL/uL Final  . Hemoglobin 12/09/2015 10.7* 12.0 - 15.0 g/dL Final  . HCT 12/09/2015 32.6* 36.0 - 46.0 % Final  . MCV 12/09/2015 87.9  78.0 - 100.0 fL Final  . MCH 12/09/2015 28.8  26.0 - 34.0 pg Final  . MCHC 12/09/2015 32.8  30.0 - 36.0 g/dL Final  . RDW 12/09/2015 20.1* 11.5 - 15.5 % Final  . Platelets 12/09/2015 307  150 - 400 K/uL Final  . Glucose-Capillary 12/09/2015 96  65 - 99 mg/dL Final  . Comment 1 12/09/2015 Document in Chart   Final  . WBC 12/10/2015 2.8* 4.0 - 10.5 K/uL Final  . RBC 12/10/2015 3.63* 3.87 - 5.11 MIL/uL Final  . Hemoglobin 12/10/2015 10.6* 12.0 - 15.0 g/dL Final  . HCT 12/10/2015 31.4* 36.0 - 46.0 % Final  . MCV 12/10/2015 86.5  78.0 -  100.0 fL Final  . MCH 12/10/2015 29.2  26.0 - 34.0 pg Final  . MCHC 12/10/2015 33.8  30.0 - 36.0 g/dL Final  . RDW 12/10/2015 19.7* 11.5 - 15.5 % Final  . Platelets  12/10/2015 310  150 - 400 K/uL Final  . Neutrophils Relative % 12/10/2015 43   Final  . Neutro Abs 12/10/2015 1.2* 1.7 - 7.7 K/uL Final  . Lymphocytes Relative 12/10/2015 41   Final  . Lymphs Abs 12/10/2015 1.2  0.7 - 4.0 K/uL Final  . Monocytes Relative 12/10/2015 10   Final  . Monocytes Absolute 12/10/2015 0.3  0.1 - 1.0 K/uL Final  . Eosinophils Relative 12/10/2015 5   Final  . Eosinophils Absolute 12/10/2015 0.1  0.0 - 0.7 K/uL Final  . Basophils Relative 12/10/2015 1   Final  . Basophils Absolute 12/10/2015 0.0  0.0 - 0.1 K/uL Final  . Sodium 12/10/2015 138  135 - 145 mmol/L Final  . Potassium 12/10/2015 3.7  3.5 - 5.1 mmol/L Final  . Chloride 12/10/2015 107  101 - 111 mmol/L Final  . CO2 12/10/2015 24  22 - 32 mmol/L Final  . Glucose, Bld 12/10/2015 97  65 - 99 mg/dL Final  . BUN 12/10/2015 7  6 - 20 mg/dL Final  . Creatinine, Ser 12/10/2015 0.67  0.44 - 1.00 mg/dL Final  . Calcium 12/10/2015 9.6  8.9 - 10.3 mg/dL Final  . GFR calc non Af Amer 12/10/2015 >60  >60 mL/min Final  . GFR calc Af Amer 12/10/2015 >60  >60 mL/min Final   Comment: (NOTE) The eGFR has been calculated using the CKD EPI equation. This calculation has not been validated in all clinical situations. eGFR's persistently <60 mL/min signify possible Chronic Kidney Disease.   . Anion gap 12/10/2015 7  5 - 15 Final  . Glucose-Capillary 12/10/2015 87  65 - 99 mg/dL Final  . Comment 1 12/10/2015 Notify RN   Final  . Comment 2 12/10/2015 Document in Chart   Final  . Sodium 12/11/2015 141  135 - 145 mmol/L Final  . Potassium 12/11/2015 3.7  3.5 - 5.1 mmol/L Final  . Chloride 12/11/2015 109  101 - 111 mmol/L Final  . CO2 12/11/2015 26  22 - 32 mmol/L Final  . Glucose, Bld 12/11/2015 93  65 - 99 mg/dL Final  . BUN 12/11/2015 7  6 - 20 mg/dL Final  . Creatinine, Ser 12/11/2015 0.67  0.44 - 1.00 mg/dL Final  . Calcium 12/11/2015 9.7  8.9 - 10.3 mg/dL Final  . GFR calc non Af Amer 12/11/2015 >60  >60 mL/min Final  .  GFR calc Af Amer 12/11/2015 >60  >60 mL/min Final   Comment: (NOTE) The eGFR has been calculated using the CKD EPI equation. This calculation has not been validated in all clinical situations. eGFR's persistently <60 mL/min signify possible Chronic Kidney Disease.   . Anion gap 12/11/2015 6  5 - 15 Final  . WBC 12/11/2015 2.9* 4.0 - 10.5 K/uL Final  . RBC 12/11/2015 3.59* 3.87 - 5.11 MIL/uL Final  . Hemoglobin 12/11/2015 10.5* 12.0 - 15.0 g/dL Final  . HCT 12/11/2015 31.8* 36.0 - 46.0 % Final  . MCV 12/11/2015 88.6  78.0 - 100.0 fL Final  . MCH 12/11/2015 29.2  26.0 - 34.0 pg Final  . MCHC 12/11/2015 33.0  30.0 - 36.0 g/dL Final  . RDW 12/11/2015 19.7* 11.5 - 15.5 % Final  . Platelets 12/11/2015 303  150 - 400 K/uL Final  .  Glucose-Capillary 12/11/2015 85  65 - 99 mg/dL Final  . Comment 1 12/11/2015 Notify RN   Final  . Comment 2 12/11/2015 Document in Chart   Final  Appointment on 12/08/2015  Component Date Value Ref Range Status  . WBC 12/08/2015 4.2  3.9 - 10.3 10e3/uL Final  . NEUT# 12/08/2015 2.4  1.5 - 6.5 10e3/uL Final  . HGB 12/08/2015 12.4  11.6 - 15.9 g/dL Final  . HCT 12/08/2015 38.4  34.8 - 46.6 % Final  . Platelets 12/08/2015 365  145 - 400 10e3/uL Final  . MCV 12/08/2015 87.7  79.5 - 101.0 fL Final  . MCH 12/08/2015 28.4  25.1 - 34.0 pg Final  . MCHC 12/08/2015 32.4  31.5 - 36.0 g/dL Final  . RBC 12/08/2015 4.38  3.70 - 5.45 10e6/uL Final  . RDW 12/08/2015 22.9* 11.2 - 14.5 % Final  . lymph# 12/08/2015 1.2  0.9 - 3.3 10e3/uL Final  . MONO# 12/08/2015 0.4  0.1 - 0.9 10e3/uL Final  . Eosinophils Absolute 12/08/2015 0.1  0.0 - 0.5 10e3/uL Final  . Basophils Absolute 12/08/2015 0.0  0.0 - 0.1 10e3/uL Final  . NEUT% 12/08/2015 57.9  38.4 - 76.8 % Final  . LYMPH% 12/08/2015 28.7  14.0 - 49.7 % Final  . MONO% 12/08/2015 8.9  0.0 - 14.0 % Final  . EOS% 12/08/2015 3.5  0.0 - 7.0 % Final  . BASO% 12/08/2015 1.0  0.0 - 2.0 % Final  . Sodium 12/08/2015 138  136 - 145 mEq/L  Final  . Potassium 12/08/2015 3.7  3.5 - 5.1 mEq/L Final  . Chloride 12/08/2015 108  98 - 109 mEq/L Final  . CO2 12/08/2015 22  22 - 29 mEq/L Final  . Glucose 12/08/2015 143* 70 - 140 mg/dl Final   Glucose reference range is for nonfasting patients. Fasting glucose reference range is 70- 100.  Marland Kitchen BUN 12/08/2015 13.6  7.0 - 26.0 mg/dL Final  . Creatinine 12/08/2015 0.9  0.6 - 1.1 mg/dL Final  . Total Bilirubin 12/08/2015 0.69  0.20 - 1.20 mg/dL Final  . Alkaline Phosphatase 12/08/2015 58  40 - 150 U/L Final  . AST 12/08/2015 60* 5 - 34 U/L Final  . ALT 12/08/2015 98* 0 - 55 U/L Final  . Total Protein 12/08/2015 7.3  6.4 - 8.3 g/dL Final  . Albumin 12/08/2015 3.5  3.5 - 5.0 g/dL Final  . Calcium 12/08/2015 10.4  8.4 - 10.4 mg/dL Final  . Anion Gap 12/08/2015 8  3 - 11 mEq/L Final  . EGFR 12/08/2015 >90  >90 ml/min/1.73 m2 Final   eGFR is calculated using the CKD-EPI Creatinine Equation (2009)  . Influenza A Ag, EIA 12/08/2015 Negative  Negative Final  . Influenza B Ag, EIA 12/08/2015 Negative  Negative Final  . Influenza Comment 12/08/2015 See note   Final  . Please note: 12/08/2015 Comment   Final   Comment: Because a negative rapid influenza antigen EIA test may not rule-out influenza viral infection, the CDC and other public health agencies have recommended that confirmatory testing using viral culture or nucleic acid amplification should be considered when clinical signs and symptoms are consistent with influenza-like illness, as well as to assist in detecting other respiratory viruses that can produce similar clinical symptoms. (Reference: https://lee-mcguire.com/ guidance_ridt.pdf)     RADIOGRAPHIC STUDIES: Ct Abdomen Pelvis Wo Contrast  12/09/2015  CLINICAL DATA:  Right lower quadrant pain with vomiting and diarrhea. Currently receiving chemotherapy for breast carcinoma. EXAM: CT ABDOMEN AND PELVIS  WITHOUT CONTRAST TECHNIQUE: Multidetector CT  imaging of the abdomen and pelvis was performed following the standard protocol without IV contrast. COMPARISON:  None. FINDINGS: Lower chest:  Visualized lung bases are unremarkable. Hepatobiliary: Unenhanced appearance of the liver is unremarkable. The gallbladder has been removed. Pancreas: Normal unenhanced appearance of the pancreas. Spleen: Normal unenhanced appearance of the spleen. Adrenals/Urinary Tract: Normal appearance of the kidneys without hydronephrosis. No renal or urinary tract calculi identified. The bladder is decompressed. Stomach/Bowel: Bowel loops show no evidence of obstruction or inflammation. No bowel lesions are seen. The appendix is well visualized and normal without evidence of appendicitis. No free air. Vascular/Lymphatic: No enlarged lymph nodes are identified. Reproductive: Uterus and adnexal regions appear unremarkable by CT with dominant cyst of the right adnexa measuring approximately 4.3 cm. This is likely physiologic. No associated free fluid in the pelvis. Other: No evidence of abscess.  No hernia identified. Musculoskeletal: Bony structures are normal and show no evidence of fracture or lesion. IMPRESSION: Unremarkable CT of the abdomen and pelvis without contrast. 4 cm right adnexal cyst is likely physiologic. Electronically Signed   By: Aletta Edouard M.D.   On: 12/09/2015 00:14    ASSESSMENT/PLAN:    Breast cancer of upper-outer quadrant of left female breast [facility]) Patient received cycle 3 of her Taxol/carbo and chemotherapy regimen on 12/05/2015.  Patient presented to the [facility] today with complaint of chronic nausea/vomiting and dehydration.  See further notes for details.  Patient is scheduled for return for labs and chemotherapy on 12/13/2015.  Dehydration Patient received her last cycle of chemotherapy on 12/05/2015.  She has been experiencing chronic nausea/vomiting and subsequent dehydration.  Since that time.  She has tried taking her anti--nausea  medication at home with only minimal effectiveness.  Patient received IV fluid rehydration today while at the cancer center; as well as Zofran and Ativan.  However, patient continued with intractable nausea and vomiting.  Patient will be transported to the emergency department for further evaluation and management of intractable nausea/vomiting and dehydration.  Brief history.  Report were called to the emergency department charge nurse; prior to the patient being transported to the emergency department via wheelchair per the [facility].  Note: Patient is considered a full code.            Chemotherapy induced nausea and vomiting Patient received her last cycle of chemotherapy on 12/05/2015.  She has been experiencing chronic nausea/vomiting and subsequent dehydration.  Since that time.  She has tried taking her anti--nausea medication at home with only minimal effectiveness.  Patient received IV fluid rehydration today while at the cancer center; as well as Zofran and Ativan.  However, patient continued with intractable nausea and vomiting.  Patient will be transported to the emergency department for further evaluation and management of intractable nausea/vomiting and dehydration.  Brief history.  Report were called to the emergency department charge nurse; prior to the patient being transported to the emergency department via wheelchair per the [facility].  Flu swab was negative.   Note: Patient is considered a full code.   Patient stated understanding of all instructions; and was in agreement with this plan of care. The patient knows to call the clinic with any problems, questions or concerns.   Review/collaboration with Dr. Lindi Adie regarding all aspects of patient's visit today.   Total time spent with patient was 25 minutes;  with greater than 75 percent of that time spent in face to face counseling regarding patient's  symptoms,  and coordination of care and  follow up.  Disclaimer:This dictation was prepared with Dragon/digital dictation along with [employer]. Any transcriptional errors that result from this process are unintentional.  Drue Second, NP 12/12/2015

## 2015-12-12 NOTE — Telephone Encounter (Signed)
Discussed with Dr. Jana Hakim. Patient to be added to Heather's schedule tomorrow. Called and let patient know of am appts. She verbalized understanding.

## 2015-12-12 NOTE — Discharge Summary (Signed)
Physician Discharge Summary  Cindy Bradshaw MZ:[id number] DOB: [birth year] DOA: 12/08/2015  PCP: [name], MD  Admit date: 12/08/2015 Discharge date: 12/12/2015  Time spent: 65 minutes  Recommendations for Outpatient Follow-up:  1. Follow-up with Shamleffer, Herschell Dimes, MD in 1-2 weeks. On follow-up patient will need a basic metabolic profile done as well as a magnesium level checked to follow-up on electrolytes and renal function. Patient's right lower quadrant abdominal pain and need to be reassessed as CT scan did show a right adnexal cyst if ongoing pain may consider outpatient referral to GYN. 2. Follow-up with Dr. Lindi Adie, oncology as previously scheduled.   Discharge Diagnoses:  Principal Problem:   Nausea vomiting and diarrhea Active Problems:   Breast cancer of upper-outer quadrant of left female breast (HCC)   Dehydration   Chemotherapy-induced peripheral neuropathy (HCC)   Asthma   Abdominal pain   Antineoplastic chemotherapy induced anemia   Leukopenia due to antineoplastic chemotherapy   Vomiting and diarrhea   Nausea & vomiting   Adnexal cyst: Right per CT 12/09/2015   Discharge Condition: Stable and improved  Diet recommendation: Soft diet.  Filed Weights   12/09/15 0327  Weight: 117.1 kg (258 lb 2.5 oz)    History of present illness:  Per Dr Joycelyn Rua K. Guebara is a 45 y.o. female with PMH of asthma, GERD, anxiety, migraine headache, obesity, breast cancer on chemotherapy, who presented with nausea, vomiting, diarrhea and abdominal pain.  Pt reported that she had chemo 3 days prior to admission. She had been doing fine until 1 day prior to admission, when she started having severe nausea and vomiting. She vomited in the morning 5 times. She also had diarrhea and abdominal pain. She had 4 bowel movements with loose stool on the day of admission. Her abdominal pain was moderate, located in the right lower quadrant, constant, nonradiating.  Patient does not have symptoms of UTI. No chest pain, cough, shortness press, unilateral weakness. She did not have fever, but had chills.  In ED, patient was found to have negative pregnancy test, WBC 4.2, temperature 99.2, tachycardia, electrolytes and renal function okay. CT abdomen/pelvis is negative for acute intra-abdominal abnormalities. Patient is admitted to inpatient for further evaluation and treatment.  Hospital Course:  Nausea vomiting and diarrhea/abdominal pain/Dehydration Admitted under observation status for IV hydration, antiemetics and supportive care. Suspect this is chemotherapy-induced enteritis. GI pathogen panel and C. difficile studies negative. Patient was hydrated with IV fluids and placed on the supportive care. Patient improved clinically diet was subsequently advanced to a clear liquid diet which she tolerated and then advanced to a full liquid diet and then a soft diet which he tolerated on the morning of discharge. Patient also noted to have right lower quadrant pain that she felt was slowly improving with pain medication. CT abdomen and pelvis which was done on admission was unremarkable except a 4.3 cm right adnexal cyst felt to be likely physiologic in nature which may be etiology of patient's right lower quadrant pain. Patient's pain improved and patient was discharged home on Ultram as needed for pain management. If no significant improvement with patient's right lower quadrant pain may consider outpatient referral to OB/GYN versus primary care. Patient will be discharged in stable and improved condition.   Active Problems:  Leukopenia/anemia Chemotherapy induced. Patient's counts remained stable during the hospitalization. Patient did not have any bleeding. And patient remained afebrile. Outpatient follow-up.    Transaminitis Monitor.   Breast cancer of upper-outer quadrant  of left female breast ([place]) Received Taxol and carboplatin on 12/05/15. Has triple  negative disease.Outpatient follow up.   Chemotherapy-induced peripheral neuropathy (HCC) Tramadol ordered as needed.   Asthma Albuterol ordered as needed.   Procedures:  CT abdomen and pelvis for 12/09/2015    Consultations:  Dr Lindi Adie: Oncology 12/09/2015  Discharge Exam: Filed Vitals:   12/11/15 2205 12/12/15 0607  BP: 134/76 123/71  Pulse: 90 86  Temp: 98.1 F (36.7 C) 98.3 F (36.8 C)  Resp: 18 16    General: NAD Cardiovascular: RRR Respiratory: CTAB  Discharge Instructions   Discharge Instructions    Diet general    Complete by:  As directed   Soft diet     Discharge instructions    Complete by:  As directed   Follow up with Dr Lindi Adie as scheduled. Follow up with Shamleffer, Herschell Dimes, MD in 1-2 weeks.     Increase activity slowly    Complete by:  As directed           Current Discharge Medication List    CONTINUE these medications which have CHANGED   Details  traMADol (ULTRAM) 50 MG tablet Take 1 tablet (50 mg total) by mouth every 6 (six) hours as needed for moderate pain or severe pain. Qty: 20 tablet, Refills: 0      CONTINUE these medications which have NOT CHANGED   Details  diphenoxylate-atropine (LOMOTIL) 2.5-0.025 MG tablet Take 2 tablets by mouth 4 (four) times daily as needed for diarrhea or loose stools. Qty: 30 tablet, Refills: 0   Associated Diagnoses: Dehydration    lidocaine-prilocaine (EMLA) cream Apply to affected area as directed. Refills: 3    LORazepam (ATIVAN) 0.5 MG tablet Take 0.5 mg by mouth at bedtime. Refills: 0    ondansetron (ZOFRAN) 8 MG tablet Take 1 tablet (8 mg total) by mouth every 8 (eight) hours as needed for nausea or vomiting. Qty: 30 tablet, Refills: 1   Associated Diagnoses: Breast cancer of upper-outer quadrant of left female breast (HCC)    PROAIR HFA 108 (90 BASE) MCG/ACT inhaler Inhale 2 puffs into the lungs every 4 (four) hours as needed. For shortness of breath Refills: 3        Allergies  Allergen Reactions  . Dilaudid [Hydromorphone Hcl] Itching  . Ivp Dye [Iodinated Diagnostic Agents] Itching  . Percocet [Oxycodone-Acetaminophen] Itching  . Toradol [Ketorolac Tromethamine] Itching   Follow-up Information    Follow up with Shamleffer, Herschell Dimes, MD. Schedule an appointment as soon as possible for a visit in 1 week.   Specialty:  Internal Medicine   Why:  f/u in 1-2 weeks.   Contact information:   [address]  [address] [place] [address] [phone]       Follow up with Rulon Eisenmenger, MD.   Specialty:  Hematology and Oncology   Why:  f/u as scheduled.   Contact information:   [place] [id number] [phone]        The results of significant diagnostics from this hospitalization (including imaging, microbiology, ancillary and laboratory) are listed below for reference.    Significant Diagnostic Studies: Ct Abdomen Pelvis Wo Contrast  12/09/2015  CLINICAL DATA:  Right lower quadrant pain with vomiting and diarrhea. Currently receiving chemotherapy for breast carcinoma. EXAM: CT ABDOMEN AND PELVIS WITHOUT CONTRAST TECHNIQUE: Multidetector CT imaging of the abdomen and pelvis was performed following the standard protocol without IV contrast. COMPARISON:  None. FINDINGS: Lower chest:  Visualized lung bases are unremarkable. Hepatobiliary: Unenhanced appearance of the liver is unremarkable. The gallbladder has been removed. Pancreas: Normal unenhanced appearance of the pancreas. Spleen: Normal unenhanced appearance of the spleen. Adrenals/Urinary Tract: Normal appearance of the kidneys without hydronephrosis. No renal or urinary tract calculi identified. The bladder is decompressed. Stomach/Bowel: Bowel loops show no evidence of obstruction or inflammation. No bowel lesions are seen. The appendix is well visualized and normal without evidence of appendicitis. No free air. Vascular/Lymphatic: No enlarged lymph nodes are  identified. Reproductive: Uterus and adnexal regions appear unremarkable by CT with dominant cyst of the right adnexa measuring approximately 4.3 cm. This is likely physiologic. No associated free fluid in the pelvis. Other: No evidence of abscess.  No hernia identified. Musculoskeletal: Bony structures are normal and show no evidence of fracture or lesion. IMPRESSION: Unremarkable CT of the abdomen and pelvis without contrast. 4 cm right adnexal cyst is likely physiologic. Electronically Signed   By: Aletta Edouard M.D.   On: 12/09/2015 00:14    Microbiology: Recent Results (from the past 240 hour(s))  Influenza A and B     Status: None   Collection Time: 12/08/15  4:24 PM  Result Value Ref Range Status   Influenza A Ag, EIA Negative Negative Final   Influenza B Ag, EIA Negative Negative Final   Influenza Comment See note  Final   Please note: Comment  Final    Comment: Because a negative rapid influenza antigen EIA test may not rule-out influenza viral infection, the CDC and other public health agencies have recommended that confirmatory testing using viral culture or nucleic acid amplification should be considered when clinical signs and symptoms are consistent with influenza-like illness, as well as to assist in detecting other respiratory viruses that can produce similar clinical symptoms. (Reference: https://lee-mcguire.com/ guidance_ridt.pdf)   C difficile quick scan w PCR reflex     Status: None   Collection Time: 12/09/15 12:32 PM  Result Value Ref Range Status   C Diff antigen NEGATIVE NEGATIVE Final   C Diff toxin NEGATIVE NEGATIVE Final   C Diff interpretation Negative for toxigenic C. difficile  Final  Gastrointestinal Panel by PCR , Stool     Status: None   Collection Time: 12/09/15 12:32 PM  Result Value Ref Range Status   Campylobacter species NOT DETECTED NOT DETECTED Final   Plesimonas shigelloides NOT DETECTED NOT DETECTED Final    Salmonella species NOT DETECTED NOT DETECTED Final   Yersinia enterocolitica NOT DETECTED NOT DETECTED Final   Vibrio species NOT DETECTED NOT DETECTED Final   Vibrio cholerae NOT DETECTED NOT DETECTED Final   Enteroaggregative E coli (EAEC) NOT DETECTED NOT DETECTED Final   Enteropathogenic E coli (EPEC) NOT DETECTED NOT DETECTED Final   Enterotoxigenic E coli (ETEC) NOT DETECTED NOT DETECTED Final   Shiga like toxin producing E coli (STEC) NOT DETECTED NOT DETECTED Final   E. coli O157 NOT DETECTED NOT DETECTED Final   Shigella/Enteroinvasive E coli (EIEC) NOT DETECTED NOT DETECTED Final   Cryptosporidium NOT DETECTED NOT DETECTED Final   Cyclospora cayetanensis NOT DETECTED NOT DETECTED Final   Entamoeba histolytica NOT DETECTED NOT DETECTED Final   Giardia lamblia NOT DETECTED NOT DETECTED Final   Adenovirus F40/41 NOT DETECTED NOT DETECTED Final   Astrovirus NOT DETECTED NOT DETECTED Final   Norovirus GI/GII NOT DETECTED NOT DETECTED Final   Rotavirus A NOT DETECTED NOT DETECTED Final   Sapovirus (I, II, IV, and V) NOT DETECTED NOT DETECTED Final  Labs: Basic Metabolic Panel:  Recent Labs Lab 12/08/15 1355 12/09/15 0452 12/10/15 0500 12/11/15 0350 12/12/15 0332  NA 138 141 138 141 141  K 3.7 3.6 3.7 3.7 3.7  CL  --  112* 107 109 110  CO2 22 24 24 26 24   GLUCOSE 143* 109* 97 93 96  BUN 13.6 11 7 7 6   CREATININE 0.9 0.58 0.67 0.67 0.61  CALCIUM 10.4 9.2 9.6 9.7 9.6   Liver Function Tests:  Recent Labs Lab 12/05/15 1352 12/08/15 1355 12/09/15 0452  AST 43* 60* 178*  ALT 65* 98* 142*  ALKPHOS 55 58 53  BILITOT <0.30 0.69 1.0  PROT 7.0 7.3 6.2*  ALBUMIN 3.4* 3.5 3.4*    Recent Labs Lab 12/09/15 0452  LIPASE 31   No results for input(s): AMMONIA in the last 168 hours. CBC:  Recent Labs Lab 12/05/15 1352 12/08/15 1355 12/09/15 0452 12/10/15 0500 12/11/15 0350  WBC 4.9 4.2 2.8* 2.8* 2.9*  NEUTROABS 2.8 2.4  --  1.2*  --   HGB 11.1* 12.4 10.7*  10.6* 10.5*  HCT 34.2* 38.4 32.6* 31.4* 31.8*  MCV 88.0 87.7 87.9 86.5 88.6  PLT 370 365 307 310 303   Cardiac Enzymes: No results for input(s): CKTOTAL, CKMB, CKMBINDEX, TROPONINI in the last 168 hours. BNP: BNP (last 3 results) No results for input(s): BNP in the last 8760 hours.  ProBNP (last 3 results) No results for input(s): PROBNP in the last 8760 hours.  CBG:  Recent Labs Lab 12/09/15 0737 12/10/15 0729 12/11/15 0720 12/12/15 0751  GLUCAP 96 87 85 88       Signed:  Kawon Willcutt MD.  [facility] 12/12/2015, 12:08 PM

## 2015-12-12 NOTE — Assessment & Plan Note (Addendum)
Patient received her last cycle of chemotherapy on 12/05/2015.  She has been experiencing chronic nausea/vomiting and subsequent dehydration.  Since that time.  She has tried taking her anti--nausea medication at home with only minimal effectiveness.  Patient received IV fluid rehydration today while at the cancer center; as well as Zofran and Ativan.  However, patient continued with intractable nausea and vomiting.  Patient will be transported to the emergency department for further evaluation and management of intractable nausea/vomiting and dehydration.  Brief history.  Report were called to the emergency department charge nurse; prior to the patient being transported to the emergency department via wheelchair per the [facility].  Flu swab was negative.   Note: Patient is considered a full code.

## 2015-12-12 NOTE — Assessment & Plan Note (Signed)
Patient received cycle 3 of her Taxol/carbo and chemotherapy regimen on 12/05/2015.  Patient presented to the [facility] today with complaint of chronic nausea/vomiting and dehydration.  See further notes for details.  Patient is scheduled for return for labs and chemotherapy on 12/13/2015.

## 2015-12-12 NOTE — Telephone Encounter (Signed)
Pt states she is being discharged from the hospital today, was admitted for N/V and diarrhea. Wants to know if she is supposed to come for chemo tomorrow as scheduled.

## 2015-12-13 ENCOUNTER — Other Ambulatory Visit: Payer: Self-pay | Admitting: *Deleted

## 2015-12-13 ENCOUNTER — Encounter: Payer: Self-pay | Admitting: *Deleted

## 2015-12-13 ENCOUNTER — Ambulatory Visit (HOSPITAL_BASED_OUTPATIENT_CLINIC_OR_DEPARTMENT_OTHER): Payer: Federal, State, Local not specified - PPO | Admitting: Nurse Practitioner

## 2015-12-13 ENCOUNTER — Ambulatory Visit (HOSPITAL_BASED_OUTPATIENT_CLINIC_OR_DEPARTMENT_OTHER): Payer: Federal, State, Local not specified - PPO

## 2015-12-13 ENCOUNTER — Other Ambulatory Visit (HOSPITAL_BASED_OUTPATIENT_CLINIC_OR_DEPARTMENT_OTHER): Payer: Federal, State, Local not specified - PPO

## 2015-12-13 ENCOUNTER — Encounter: Payer: Self-pay | Admitting: Nurse Practitioner

## 2015-12-13 ENCOUNTER — Other Ambulatory Visit: Payer: Self-pay

## 2015-12-13 VITALS — BP 138/86 | HR 91 | Temp 98.1°F | Resp 19 | Ht 65.0 in | Wt 247.8 lb

## 2015-12-13 VITALS — BP 115/65 | HR 87 | Temp 97.8°F | Resp 18

## 2015-12-13 DIAGNOSIS — Z452 Encounter for adjustment and management of vascular access device: Secondary | ICD-10-CM

## 2015-12-13 DIAGNOSIS — C50412 Malignant neoplasm of upper-outer quadrant of left female breast: Secondary | ICD-10-CM

## 2015-12-13 DIAGNOSIS — N859 Noninflammatory disorder of uterus, unspecified: Secondary | ICD-10-CM | POA: Diagnosis not present

## 2015-12-13 DIAGNOSIS — D509 Iron deficiency anemia, unspecified: Secondary | ICD-10-CM

## 2015-12-13 DIAGNOSIS — R21 Rash and other nonspecific skin eruption: Secondary | ICD-10-CM

## 2015-12-13 DIAGNOSIS — T451X5A Adverse effect of antineoplastic and immunosuppressive drugs, initial encounter: Secondary | ICD-10-CM

## 2015-12-13 DIAGNOSIS — R5383 Other fatigue: Secondary | ICD-10-CM

## 2015-12-13 DIAGNOSIS — R630 Anorexia: Secondary | ICD-10-CM

## 2015-12-13 DIAGNOSIS — E860 Dehydration: Secondary | ICD-10-CM | POA: Diagnosis not present

## 2015-12-13 DIAGNOSIS — L299 Pruritus, unspecified: Secondary | ICD-10-CM

## 2015-12-13 DIAGNOSIS — G4700 Insomnia, unspecified: Secondary | ICD-10-CM

## 2015-12-13 DIAGNOSIS — R748 Abnormal levels of other serum enzymes: Secondary | ICD-10-CM

## 2015-12-13 DIAGNOSIS — R110 Nausea: Secondary | ICD-10-CM

## 2015-12-13 DIAGNOSIS — R112 Nausea with vomiting, unspecified: Secondary | ICD-10-CM

## 2015-12-13 LAB — CBC WITH DIFFERENTIAL/PLATELET
BASO%: 0.9 % (ref 0.0–2.0)
Basophils Absolute: 0 10*3/uL (ref 0.0–0.1)
EOS%: 4.9 % (ref 0.0–7.0)
Eosinophils Absolute: 0.2 10*3/uL (ref 0.0–0.5)
HCT: 34.3 % — ABNORMAL LOW (ref 34.8–46.6)
HGB: 11.4 g/dL — ABNORMAL LOW (ref 11.6–15.9)
LYMPH%: 28.4 % (ref 14.0–49.7)
MCH: 29.1 pg (ref 25.1–34.0)
MCHC: 33.1 g/dL (ref 31.5–36.0)
MCV: 88.1 fL (ref 79.5–101.0)
MONO#: 0.5 10*3/uL (ref 0.1–0.9)
MONO%: 10.8 % (ref 0.0–14.0)
NEUT%: 55 % (ref 38.4–76.8)
NEUTROS ABS: 2.3 10*3/uL (ref 1.5–6.5)
PLATELETS: 287 10*3/uL (ref 145–400)
RBC: 3.9 10*6/uL (ref 3.70–5.45)
RDW: 22.1 % — ABNORMAL HIGH (ref 11.2–14.5)
WBC: 4.2 10*3/uL (ref 3.9–10.3)
lymph#: 1.2 10*3/uL (ref 0.9–3.3)

## 2015-12-13 LAB — COMPREHENSIVE METABOLIC PANEL
ALT: 153 U/L — AB (ref 0–55)
ANION GAP: 9 meq/L (ref 3–11)
AST: 93 U/L — AB (ref 5–34)
Albumin: 3.5 g/dL (ref 3.5–5.0)
Alkaline Phosphatase: 57 U/L (ref 40–150)
BUN: 12.3 mg/dL (ref 7.0–26.0)
CHLORIDE: 107 meq/L (ref 98–109)
CO2: 25 meq/L (ref 22–29)
CREATININE: 0.7 mg/dL (ref 0.6–1.1)
Calcium: 10.4 mg/dL (ref 8.4–10.4)
EGFR: >90 mL/min/{1.73_m2} (ref >90)
Glucose: 99 mg/dl (ref 70–140)
Potassium: 3.6 mEq/L (ref 3.5–5.1)
SODIUM: 140 meq/L (ref 136–145)
TOTAL PROTEIN: 7 g/dL (ref 6.4–8.3)
Total Bilirubin: 0.34 mg/dL (ref 0.20–1.20)

## 2015-12-13 MED ORDER — ONDANSETRON HCL 8 MG PO TABS: 8.0000 mg | ORAL_TABLET | Freq: Three times a day (TID) | ORAL | Status: DC | PRN

## 2015-12-13 MED ORDER — PROCHLORPERAZINE MALEATE 10 MG PO TABS: 10.0000 mg | ORAL_TABLET | Freq: Four times a day (QID) | ORAL | Status: DC | PRN

## 2015-12-13 MED ORDER — LORAZEPAM 0.5 MG PO TABS: 0.5000 mg | ORAL_TABLET | Freq: Every day | ORAL | Status: DC

## 2015-12-13 MED ORDER — ACETAMINOPHEN 325 MG PO TABS: 650.0000 mg | ORAL_TABLET | Freq: Once | ORAL | Status: DC | PRN

## 2015-12-13 MED ORDER — ALTEPLASE 2 MG IJ SOLR
2.0000 mg | Freq: Once | INTRAMUSCULAR | Status: AC
  Administered 2015-12-13: 2 mg
  Filled 2015-12-13: qty 2

## 2015-12-13 MED ORDER — TRIAMCINOLONE ACETONIDE 0.1 % EX CREA: 1.0000 "application " | TOPICAL_CREAM | Freq: Two times a day (BID) | CUTANEOUS | Status: DC

## 2015-12-13 MED ORDER — SODIUM CHLORIDE 0.9 % IV SOLN
Freq: Once | INTRAVENOUS | Status: AC
  Administered 2015-12-13: 11:00:00 via INTRAVENOUS

## 2015-12-13 NOTE — Addendum Note (Signed)
Addended by: Marcelino Duster on: 12/13/2015 09:33 AM   Modules accepted: Orders

## 2015-12-13 NOTE — Patient Instructions (Signed)

## 2015-12-13 NOTE — Progress Notes (Signed)
Per Nira Conn NP, Dr. Jana Hakim on call, requesting chemo is held today due to elevated liver enzymes. Pt offered IVFs and agrees she needs them. Per Nira Conn, okay to give 1L NS over 2 hrs.

## 2015-12-13 NOTE — Progress Notes (Addendum)
Patient Care Team: [name], MD as PCP - General (Internal Medicine)   SUMMARY OF ONCOLOGIC HISTORY:   Breast cancer of upper-outer quadrant of left female breast ([facility])   08/17/2015 Initial Diagnosis left breast biopsy 1:30 position: Invasive ductal carcinoma, grade 3, ER 0%, PR 0%, HER-2 negative ratio 1.48, Ki-67 90%, 3.1 cm tumor, axilla negative, T2 N0 stage II a clinical stage   09/19/2015 -  Neo-Adjuvant Chemotherapy Neoadjuvant chemotherapy with dose dense Adriamycin and Cytoxan 4 followed by Taxol and carboplatin weekly 12   09/28/2015 Procedure  Left breast biopsy anterior third left upper outer quadrant: PASH;  upper inner quadrant: PASH    CHIEF COMPLIANT: cycle 4 Taxol   INTERVAL HISTORY: Cindy Bradshaw is a 45 year old with above-mentioned history of triple negative left breast cancer currently on neoadjuvant chemotherapy with weekly Taxol and carboplatin. She was admitted to the hospital last week for 3 days for inretractable nausea, vomiting, diarrhea, and abdominal pain. She was discharged after several liters of IV fluids and IV antiemetics. Per Dr. Geralyn Flash last progress note, we are eliminating carboplatin from her regimen. She is still mildly nauseous today, but it is under decent control with zofran. She is not currently having any diarrhea, but has imodium and lomotil on hand. The abdominal pain was evaluated and determined to be a right adnexal cyst. She is making plans to visit her GYN for follow up. Otherwise she complains of fatigue, lack of appetite, and a pruritic rash to her bilateral arms and legs. She was told this is eczema. It keeps her up at night.   REVIEW OF SYSTEMS:   Constitutional: Denies fevers, chills or abnormal weight loss, (+) loss of appetite, (+) fatigue Eyes: Denies blurriness of vision Ears, nose, mouth, throat, and face: Denies mucositis or sore throat Respiratory: Denies cough, dyspnea or wheezes Cardiovascular: Denies  palpitation, chest discomfort Gastrointestinal:  (+) moderate nausea,, (+) diarrhea Skin: (+) pruritic raised rash to bilateral arms and legs Lymphatics: Denies new lymphadenopathy or easy bruising Neurological:numbness in the fingers and toes, swelling of the fingers and toes. Behavioral/Psych: Mood is stable, no new changes  Extremities: No lower extremity edema  All other systems were reviewed with the patient and are negative.  I have reviewed the past medical history, past surgical history, social history and family history with the patient and they are unchanged from previous note.  ALLERGIES:  is allergic to dilaudid; ivp dye; percocet; and toradol.  MEDICATIONS:  Current Outpatient Prescriptions  Medication Sig Dispense Refill  . lidocaine-prilocaine (EMLA) cream Apply to affected area as directed.  3  . traMADol (ULTRAM) 50 MG tablet Take 1 tablet (50 mg total) by mouth every 6 (six) hours as needed for moderate pain or severe pain. 20 tablet 0  . diphenoxylate-atropine (LOMOTIL) 2.5-0.025 MG tablet Take 2 tablets by mouth 4 (four) times daily as needed for diarrhea or loose stools. (Patient not taking: Reported on 12/13/2015) 30 tablet 0  . LORazepam (ATIVAN) 0.5 MG tablet Take 1 tablet (0.5 mg total) by mouth at bedtime. 30 tablet 0  . ondansetron (ZOFRAN) 8 MG tablet Take 1 tablet (8 mg total) by mouth every 8 (eight) hours as needed for nausea or vomiting. 30 tablet 1  . PROAIR HFA 108 (90 BASE) MCG/ACT inhaler Inhale 2 puffs into the lungs every 4 (four) hours as needed. Reported on 12/13/2015  3  . prochlorperazine (COMPAZINE) 10 MG tablet Take 1 tablet (10 mg total) by mouth every 6 (six)  hours as needed for nausea or vomiting. 30 tablet 0  . triamcinolone cream (KENALOG) 0.1 % Apply 1 application topically 2 (two) times daily. 160 g 1   No current facility-administered medications for this visit.    PHYSICAL EXAMINATION: ECOG PERFORMANCE STATUS: 1 - Symptomatic but  completely ambulatory  Filed Vitals:   12/13/15 0829  BP: 138/86  Pulse: 91  Temp: 98.1 F (36.7 C)  Resp: 19   Filed Weights   12/13/15 0829  Weight: 247 lb 12.8 oz (112.401 kg)    GENERAL:alert, no distress and comfortable SKIN: skin color, texture, turgor are normal, raised flesh colored rash to bilateral arms and legs EYES: normal, Conjunctiva are pink and non-injected, sclera clear OROPHARYNX:no exudate, no erythema and lips, buccal mucosa, and tongue normal  NECK: supple, thyroid normal size, non-tender, without nodularity LYMPH:  no palpable lymphadenopathy in the cervical, axillary or inguinal LUNGS: clear to auscultation and percussion with normal breathing effort HEART: regular rate & rhythm and no murmurs and no lower extremity edema ABDOMEN:abdomen soft, non-tender and normal bowel sounds MUSCULOSKELETAL:no cyanosis of digits and no clubbing  NEURO: alert & oriented x 3 with fluent speech, grade 1 peripheral neuropathy EXTREMITIES: No lower extremity edema  LABORATORY DATA:  I have reviewed the data as listed   Chemistry      Component Value Date/Time   NA 141 12/12/2015 0332   NA 138 12/08/2015 1355   K 3.7 12/12/2015 0332   K 3.7 12/08/2015 1355   CL 110 12/12/2015 0332   CO2 24 12/12/2015 0332   CO2 22 12/08/2015 1355   BUN 6 12/12/2015 0332   BUN 13.6 12/08/2015 1355   CREATININE 0.61 12/12/2015 0332   CREATININE 0.9 12/08/2015 1355      Component Value Date/Time   CALCIUM 9.6 12/12/2015 0332   CALCIUM 10.4 12/08/2015 1355   ALKPHOS 53 12/09/2015 0452   ALKPHOS 58 12/08/2015 1355   AST 178* 12/09/2015 0452   AST 60* 12/08/2015 1355   ALT 142* 12/09/2015 0452   ALT 98* 12/08/2015 1355   BILITOT 1.0 12/09/2015 0452   BILITOT 0.69 12/08/2015 1355       Lab Results  Component Value Date   WBC 4.2 12/13/2015   HGB 11.4* 12/13/2015   HCT 34.3* 12/13/2015   MCV 88.1 12/13/2015   PLT 287 12/13/2015   NEUTROABS 2.3 12/13/2015      ASSESSMENT & PLAN:  Breast cancer of upper-outer quadrant of left female breast (HCC) Left breast biopsy 08/17/2015:1:30 position: Invasive ductal carcinoma, grade 3, ER 0%, PR 0%, HER-2 negative ratio 1.48, Ki-67 90%, 3.1 cm tumor, axilla negative, T2 N0 stage II a clinical stage  Treatment plan: 1. Neoadjuvant chemotherapy with dose dense Adriamycin and Cytoxan 4 followed by Taxol and carboplatin weekly 12. Carboplatin discontinued after 3 cycles. 2. Followed by surgery 3. Followed by adjuvant radiation Breast biopsies x 2: 09/28/2015 : PASH -------------------------------------------------------------------------------------------------------------------------------------------------- Current treatment: Completed Cycle 4 dose dense Adriamycin and Cytoxan on 10/31/15; Today is cycle 4 reduced dose Taxol. Carboplatin discontinued after cycle 3 Echocardiogram 09/13/2015: EF 55-60% Labs were reviewed.  Normocytic anemia: work up revealed iron deficiency: Received IV iron treatment with chemotherapy Chemotoxicities: 1. Severe nausea and vomiting: Carboplatin discontinued. Compazine 66m q6hrs prescribed for use along with zofran. 2. Hair loss 3. Conjunctivitis versus corneal abrasion: Resolved 4. Insomnia: Encouraged to take 2 Ativan once at bedtime. 5. Fever up to 100F: Resoled 7. Dehydration: received IV fluids with normal saline along with Zofran. Will  return for IVF on Thursday. 8. Diarrhea: Improved with Imodium 9. Skin dryness leading to itching: Instructed to use Vaseline on Aquaphor 11. New skin rash with pruritus: kenalog cream prescribed 10. Diarrhea alternating with constipation 12. Elevated liver enzymes: AST 93. ALT 153. Consulted with Dr. Jana Hakim. Suggested we hold off on cycle 4 of treatment today. Return next week to recheck labs.   Anemia:Hemoglobin electrophoresis 10/31/2015: Normal. Her daughter and her mother are all anemic. Her hemoglobin is 11.4  mg  Right adnexal cyst: to be followed by gynecologist  Monitoring very closely for chemotherapy toxicities.  Hold treatment today. Return to clinic in 1 week for restart of cycle 4 Taxol and follow up with Dr. Lindi Adie.   No orders of the defined types were placed in this encounter.   The patient has a good understanding of the overall plan. she agrees with it. she will call with any problems that may develop before the next visit here.   Laurie Panda, NP 12/13/2015

## 2015-12-14 ENCOUNTER — Ambulatory Visit: Payer: Self-pay | Admitting: Nurse Practitioner

## 2015-12-15 ENCOUNTER — Ambulatory Visit (HOSPITAL_BASED_OUTPATIENT_CLINIC_OR_DEPARTMENT_OTHER): Payer: Federal, State, Local not specified - PPO

## 2015-12-15 VITALS — BP 130/62 | HR 94 | Temp 98.1°F | Resp 18

## 2015-12-15 DIAGNOSIS — E860 Dehydration: Secondary | ICD-10-CM | POA: Diagnosis not present

## 2015-12-15 DIAGNOSIS — C50412 Malignant neoplasm of upper-outer quadrant of left female breast: Secondary | ICD-10-CM

## 2015-12-15 MED ORDER — SODIUM CHLORIDE 0.9 % IV SOLN
Freq: Once | INTRAVENOUS | Status: AC
Start: 2015-12-15 — End: ?

## 2015-12-15 MED ORDER — SODIUM CHLORIDE 0.9 % IV SOLN
Freq: Once | INTRAVENOUS | Status: AC
  Administered 2015-12-15: 15:00:00 via INTRAVENOUS

## 2015-12-15 NOTE — Patient Instructions (Signed)

## 2015-12-15 NOTE — Addendum Note (Signed)
Addended by: Marcelino Duster on: 12/15/2015 02:31 PM   Modules accepted: Orders

## 2015-12-20 ENCOUNTER — Encounter: Payer: Self-pay | Admitting: Hematology and Oncology

## 2015-12-20 ENCOUNTER — Other Ambulatory Visit (HOSPITAL_BASED_OUTPATIENT_CLINIC_OR_DEPARTMENT_OTHER): Payer: Federal, State, Local not specified - PPO

## 2015-12-20 ENCOUNTER — Ambulatory Visit (HOSPITAL_BASED_OUTPATIENT_CLINIC_OR_DEPARTMENT_OTHER): Payer: Federal, State, Local not specified - PPO | Admitting: Hematology and Oncology

## 2015-12-20 ENCOUNTER — Encounter: Payer: Self-pay | Admitting: *Deleted

## 2015-12-20 ENCOUNTER — Ambulatory Visit (HOSPITAL_BASED_OUTPATIENT_CLINIC_OR_DEPARTMENT_OTHER): Payer: Federal, State, Local not specified - PPO

## 2015-12-20 ENCOUNTER — Telehealth: Payer: Self-pay | Admitting: Hematology and Oncology

## 2015-12-20 ENCOUNTER — Other Ambulatory Visit: Payer: Self-pay

## 2015-12-20 VITALS — BP 133/80 | HR 90 | Temp 98.4°F | Resp 19 | Ht 65.0 in | Wt 249.1 lb

## 2015-12-20 DIAGNOSIS — C50412 Malignant neoplasm of upper-outer quadrant of left female breast: Secondary | ICD-10-CM

## 2015-12-20 DIAGNOSIS — R110 Nausea: Secondary | ICD-10-CM | POA: Diagnosis not present

## 2015-12-20 DIAGNOSIS — Z5111 Encounter for antineoplastic chemotherapy: Secondary | ICD-10-CM | POA: Diagnosis not present

## 2015-12-20 DIAGNOSIS — D509 Iron deficiency anemia, unspecified: Secondary | ICD-10-CM | POA: Diagnosis not present

## 2015-12-20 LAB — COMPREHENSIVE METABOLIC PANEL
ALBUMIN: 3.3 g/dL — AB (ref 3.5–5.0)
ALK PHOS: 61 U/L (ref 40–150)
ALT: 74 U/L — AB (ref 0–55)
AST: 40 U/L — ABNORMAL HIGH (ref 5–34)
Anion Gap: 6 mEq/L (ref 3–11)
BUN: 9.4 mg/dL (ref 7.0–26.0)
CALCIUM: 9.9 mg/dL (ref 8.4–10.4)
CO2: 25 mEq/L (ref 22–29)
CREATININE: 0.7 mg/dL (ref 0.6–1.1)
Chloride: 110 mEq/L — ABNORMAL HIGH (ref 98–109)
EGFR: >90 mL/min/{1.73_m2} (ref >90)
GLUCOSE: 98 mg/dL (ref 70–140)
POTASSIUM: 3.7 meq/L (ref 3.5–5.1)
SODIUM: 141 meq/L (ref 136–145)
TOTAL PROTEIN: 6.9 g/dL (ref 6.4–8.3)
Total Bilirubin: 0.35 mg/dL (ref 0.20–1.20)

## 2015-12-20 LAB — CBC WITH DIFFERENTIAL/PLATELET
BASO%: 0.6 % (ref 0.0–2.0)
BASOS ABS: 0 10*3/uL (ref 0.0–0.1)
EOS ABS: 0.2 10*3/uL (ref 0.0–0.5)
EOS%: 3.8 % (ref 0.0–7.0)
HEMATOCRIT: 34.8 % (ref 34.8–46.6)
HEMOGLOBIN: 11.2 g/dL — AB (ref 11.6–15.9)
LYMPH#: 1.4 10*3/uL (ref 0.9–3.3)
LYMPH%: 35.2 % (ref 14.0–49.7)
MCH: 28.5 pg (ref 25.1–34.0)
MCHC: 32.3 g/dL (ref 31.5–36.0)
MCV: 88.2 fL (ref 79.5–101.0)
MONO#: 0.6 10*3/uL (ref 0.1–0.9)
MONO%: 14 % (ref 0.0–14.0)
NEUT%: 46.4 % (ref 38.4–76.8)
NEUTROS ABS: 1.8 10*3/uL (ref 1.5–6.5)
Platelets: 359 10*3/uL (ref 145–400)
RBC: 3.94 10*6/uL (ref 3.70–5.45)
RDW: 21.4 % — AB (ref 11.2–14.5)
WBC: 4 10*3/uL (ref 3.9–10.3)

## 2015-12-20 MED ORDER — HEPARIN SOD (PORK) LOCK FLUSH 100 UNIT/ML IV SOLN
500.0000 [IU] | Freq: Once | INTRAVENOUS | Status: AC | PRN
  Administered 2015-12-20: 500 [IU]
  Filled 2015-12-20: qty 5

## 2015-12-20 MED ORDER — SODIUM CHLORIDE 0.9 % IV SOLN
Freq: Once | INTRAVENOUS | Status: AC
  Administered 2015-12-20: 14:00:00 via INTRAVENOUS

## 2015-12-20 MED ORDER — PACLITAXEL CHEMO INJECTION 300 MG/50ML
50.0000 mg/m2 | Freq: Once | INTRAVENOUS | Status: AC
  Administered 2015-12-20: 114 mg via INTRAVENOUS
  Filled 2015-12-20: qty 19

## 2015-12-20 MED ORDER — DIPHENHYDRAMINE HCL 50 MG/ML IJ SOLN
INTRAMUSCULAR | Status: AC
  Filled 2015-12-20: qty 1

## 2015-12-20 MED ORDER — PALONOSETRON HCL INJECTION 0.25 MG/5ML
INTRAVENOUS | Status: AC
  Filled 2015-12-20: qty 5

## 2015-12-20 MED ORDER — SODIUM CHLORIDE 0.9 % IJ SOLN
10.0000 mL | INTRAMUSCULAR | Status: DC | PRN
  Administered 2015-12-20: 10 mL
  Filled 2015-12-20: qty 10

## 2015-12-20 MED ORDER — FAMOTIDINE IN NACL 20-0.9 MG/50ML-% IV SOLN
INTRAVENOUS | Status: AC
  Filled 2015-12-20: qty 50

## 2015-12-20 MED ORDER — DEXTROSE 5 % IV SOLN: 50.0000 mg/m2 | Freq: Once | INTRAVENOUS | Status: DC

## 2015-12-20 MED ORDER — FAMOTIDINE IN NACL 20-0.9 MG/50ML-% IV SOLN
20.0000 mg | Freq: Once | INTRAVENOUS | Status: AC
  Administered 2015-12-20: 20 mg via INTRAVENOUS

## 2015-12-20 MED ORDER — PALONOSETRON HCL INJECTION 0.25 MG/5ML
0.2500 mg | Freq: Once | INTRAVENOUS | Status: AC
  Administered 2015-12-20: 0.25 mg via INTRAVENOUS

## 2015-12-20 MED ORDER — SODIUM CHLORIDE 0.9 % IV SOLN
Freq: Once | INTRAVENOUS | Status: AC
  Administered 2015-12-20: 15:00:00 via INTRAVENOUS
  Filled 2015-12-20: qty 1

## 2015-12-20 MED ORDER — DIPHENHYDRAMINE HCL 50 MG/ML IJ SOLN
25.0000 mg | Freq: Once | INTRAMUSCULAR | Status: AC
  Administered 2015-12-20: 25 mg via INTRAVENOUS

## 2015-12-20 NOTE — Patient Instructions (Signed)
Paclitaxel injection What is this medicine? PACLITAXEL (PAK li TAX el) is a chemotherapy drug. It targets fast dividing cells, like cancer cells, and causes these cells to die. This medicine is used to treat ovarian cancer, breast cancer, and other cancers. This medicine may be used for other purposes; ask your health care provider or pharmacist if you have questions. What should I tell my health care provider before I take this medicine? They need to know if you have any of these conditions: -blood disorders -irregular heartbeat -infection (especially a virus infection such as chickenpox, cold sores, or herpes) -liver disease -previous or ongoing radiation therapy -an unusual or allergic reaction to paclitaxel, alcohol, polyoxyethylated castor oil, other chemotherapy agents, other medicines, foods, dyes, or preservatives -pregnant or trying to get pregnant -breast-feeding How should I use this medicine? This drug is given as an infusion into a vein. It is administered in a hospital or clinic by a specially trained health care professional. Talk to your pediatrician regarding the use of this medicine in children. Special care may be needed. Overdosage: If you think you have taken too much of this medicine contact a poison control center or emergency room at once. NOTE: This medicine is only for you. Do not share this medicine with others. What if I miss a dose? It is important not to miss your dose. Call your doctor or health care professional if you are unable to keep an appointment. What may interact with this medicine? Do not take this medicine with any of the following medications: -disulfiram -metronidazole This medicine may also interact with the following medications: -cyclosporine -diazepam -ketoconazole -medicines to increase blood counts like filgrastim, pegfilgrastim, sargramostim -other chemotherapy drugs like cisplatin, doxorubicin, epirubicin, etoposide, teniposide,  vincristine -quinidine -testosterone -vaccines -verapamil Talk to your doctor or health care professional before taking any of these medicines: -acetaminophen -aspirin -ibuprofen -ketoprofen -naproxen This list may not describe all possible interactions. Give your health care provider a list of all the medicines, herbs, non-prescription drugs, or dietary supplements you use. Also tell them if you smoke, drink alcohol, or use illegal drugs. Some items may interact with your medicine. What should I watch for while using this medicine? Your condition will be monitored carefully while you are receiving this medicine. You will need important blood work done while you are taking this medicine. This drug may make you feel generally unwell. This is not uncommon, as chemotherapy can affect healthy cells as well as cancer cells. Report any side effects. Continue your course of treatment even though you feel ill unless your doctor tells you to stop. This medicine can cause serious allergic reactions. To reduce your risk you will need to take other medicine(s) before treatment with this medicine. In some cases, you may be given additional medicines to help with side effects. Follow all directions for their use. Call your doctor or health care professional for advice if you get a fever, chills or sore throat, or other symptoms of a cold or flu. Do not treat yourself. This drug decreases your body's ability to fight infections. Try to avoid being around people who are sick. This medicine may increase your risk to bruise or bleed. Call your doctor or health care professional if you notice any unusual bleeding. Be careful brushing and flossing your teeth or using a toothpick because you may get an infection or bleed more easily. If you have any dental work done, tell your dentist you are receiving this medicine. Avoid taking products that   contain aspirin, acetaminophen, ibuprofen, naproxen, or ketoprofen unless  instructed by your doctor. These medicines may hide a fever. Do not become pregnant while taking this medicine. Women should inform their doctor if they wish to become pregnant or think they might be pregnant. There is a potential for serious side effects to an unborn child. Talk to your health care professional or pharmacist for more information. Do not breast-feed an infant while taking this medicine. Men are advised not to father a child while receiving this medicine. This product may contain alcohol. Ask your pharmacist or healthcare provider if this medicine contains alcohol. Be sure to tell all healthcare providers you are taking this medicine. Certain medicines, like metronidazole and disulfiram, can cause an unpleasant reaction when taken with alcohol. The reaction includes flushing, headache, nausea, vomiting, sweating, and increased thirst. The reaction can last from 30 minutes to several hours. What side effects may I notice from receiving this medicine? Side effects that you should report to your doctor or health care professional as soon as possible: -allergic reactions like skin rash, itching or hives, swelling of the face, lips, or tongue -low blood counts - This drug may decrease the number of white blood cells, red blood cells and platelets. You may be at increased risk for infections and bleeding. -signs of infection - fever or chills, cough, sore throat, pain or difficulty passing urine -signs of decreased platelets or bleeding - bruising, pinpoint red spots on the skin, black, tarry stools, nosebleeds -signs of decreased red blood cells - unusually weak or tired, fainting spells, lightheadedness -breathing problems -chest pain -high or low blood pressure -mouth sores -nausea and vomiting -pain, swelling, redness or irritation at the injection site -pain, tingling, numbness in the hands or feet -slow or irregular heartbeat -swelling of the ankle, feet, hands Side effects that  usually do not require medical attention (report to your doctor or health care professional if they continue or are bothersome): -bone pain -complete hair loss including hair on your head, underarms, pubic hair, eyebrows, and eyelashes -changes in the color of fingernails -diarrhea -loosening of the fingernails -loss of appetite -muscle or joint pain -red flush to skin -sweating This list may not describe all possible side effects. Call your doctor for medical advice about side effects. You may report side effects to FDA at 1-800-FDA-1088. Where should I keep my medicine? This drug is given in a hospital or clinic and will not be stored at home. NOTE: This sheet is a summary. It may not cover all possible information. If you have questions about this medicine, talk to your doctor, pharmacist, or health care provider.    2016, [employer]. (2015-04-07 13:02:56)  

## 2015-12-20 NOTE — Telephone Encounter (Signed)
spoke w/ pt confirmed may appt

## 2015-12-20 NOTE — Progress Notes (Signed)
Patient Care Team: [name], MD as PCP - General (Internal Medicine)  SUMMARY OF ONCOLOGIC HISTORY:   Breast cancer of upper-outer quadrant of left female breast ([place])   08/17/2015 Initial Diagnosis left breast biopsy 1:30 position: Invasive ductal carcinoma, grade 3, ER 0%, PR 0%, HER-2 negative ratio 1.48, Ki-67 90%, 3.1 cm tumor, axilla negative, T2 N0 stage II a clinical stage   09/19/2015 -  Neo-Adjuvant Chemotherapy Neoadjuvant chemotherapy with dose dense Adriamycin and Cytoxan 4 followed by Taxol and carboplatin weekly 12   09/28/2015 Procedure  Left breast biopsy anterior third left upper outer quadrant: PASH;  upper inner quadrant: PASH    CHIEF COMPLIANT: Cycle 4 Taxol  INTERVAL HISTORY: Cindy Bradshaw is a 45 year old with above-mentioned history of left breast cancer currently on neoadjuvant chemotherapy. She received cycle 3 of Taxol and carboplatin and was hospitalized with intractable nausea and vomiting and diarrhea. She improved and is here today to start cycle 4. Last week her chemotherapy was held because of elevation of AST and ALT. She feels that her appetite has gone away. She is trying to forcibly eat food. Does not have any further nausea issues.  REVIEW OF SYSTEMS:   Constitutional: Denies fevers, chills or abnormal weight loss Eyes: Denies blurriness of vision Ears, nose, mouth, throat, and face: Denies mucositis or sore throat Respiratory: Denies cough, dyspnea or wheezes Cardiovascular: Denies palpitation, chest discomfort Gastrointestinal: Marked improvement in nausea and vomiting Skin: Denies abnormal skin rashes Lymphatics: Denies new lymphadenopathy or easy bruising Neurological:Denies numbness, tingling or new weaknesses Behavioral/Psych: Mood is stable, no new changes  Extremities: No lower extremity edema All other systems were reviewed with the patient and are negative.  I have reviewed the past medical history, past surgical  history, social history and family history with the patient and they are unchanged from previous note.  ALLERGIES:  is allergic to tape; dilaudid; ivp dye; percocet; and toradol.  MEDICATIONS:  Current Outpatient Prescriptions  Medication Sig Dispense Refill  . diphenoxylate-atropine (LOMOTIL) 2.5-0.025 MG tablet Take 2 tablets by mouth 4 (four) times daily as needed for diarrhea or loose stools. (Patient not taking: Reported on 12/13/2015) 30 tablet 0  . lidocaine-prilocaine (EMLA) cream Apply to affected area as directed.  3  . LORazepam (ATIVAN) 0.5 MG tablet Take 1 tablet (0.5 mg total) by mouth at bedtime. 30 tablet 0  . ondansetron (ZOFRAN) 8 MG tablet Take 1 tablet (8 mg total) by mouth every 8 (eight) hours as needed for nausea or vomiting. 30 tablet 1  . PROAIR HFA 108 (90 BASE) MCG/ACT inhaler Inhale 2 puffs into the lungs every 4 (four) hours as needed. Reported on 12/13/2015  3  . prochlorperazine (COMPAZINE) 10 MG tablet Take 1 tablet (10 mg total) by mouth every 6 (six) hours as needed for nausea or vomiting. 30 tablet 0  . traMADol (ULTRAM) 50 MG tablet Take 1 tablet (50 mg total) by mouth every 6 (six) hours as needed for moderate pain or severe pain. 20 tablet 0  . triamcinolone cream (KENALOG) 0.1 % Apply 1 application topically 2 (two) times daily. 160 g 1   No current facility-administered medications for this visit.   Facility-Administered Medications Ordered in Other Visits  Medication Dose Route Frequency Provider Last Rate Last Dose  . 0.9 %  sodium chloride infusion   Intravenous Once Laurie Panda, NP        PHYSICAL EXAMINATION: ECOG PERFORMANCE STATUS: 1 - Symptomatic but completely ambulatory  Filed Vitals:   12/20/15 1320  BP: 133/80  Pulse: 90  Temp: 98.4 F (36.9 C)  Resp: 19   Filed Weights   12/20/15 1320  Weight: 249 lb 1.6 oz (112.991 kg)    GENERAL:alert, no distress and comfortable SKIN: skin color, texture, turgor are normal, no rashes  or significant lesions EYES: normal, Conjunctiva are pink and non-injected, sclera clear OROPHARYNX:no exudate, no erythema and lips, buccal mucosa, and tongue normal  NECK: supple, thyroid normal size, non-tender, without nodularity LYMPH:  no palpable lymphadenopathy in the cervical, axillary or inguinal LUNGS: clear to auscultation and percussion with normal breathing effort HEART: regular rate & rhythm and no murmurs and no lower extremity edema ABDOMEN:abdomen soft, non-tender and normal bowel sounds MUSCULOSKELETAL:no cyanosis of digits and no clubbing  NEURO: alert & oriented x 3 with fluent speech, no focal motor/sensory deficits EXTREMITIES: No lower extremity edema  LABORATORY DATA:  I have reviewed the data as listed   Chemistry      Component Value Date/Time   NA 140 12/13/2015 0821   NA 141 12/12/2015 0332   K 3.6 12/13/2015 0821   K 3.7 12/12/2015 0332   CL 110 12/12/2015 0332   CO2 25 12/13/2015 0821   CO2 24 12/12/2015 0332   BUN 12.3 12/13/2015 0821   BUN 6 12/12/2015 0332   CREATININE 0.7 12/13/2015 0821   CREATININE 0.61 12/12/2015 0332      Component Value Date/Time   CALCIUM 10.4 12/13/2015 0821   CALCIUM 9.6 12/12/2015 0332   ALKPHOS 57 12/13/2015 0821   ALKPHOS 53 12/09/2015 0452   AST 93* 12/13/2015 0821   AST 178* 12/09/2015 0452   ALT 153* 12/13/2015 0821   ALT 142* 12/09/2015 0452   BILITOT 0.34 12/13/2015 0821   BILITOT 1.0 12/09/2015 0452       Lab Results  Component Value Date   WBC 4.0 12/20/2015   HGB 11.2* 12/20/2015   HCT 34.8 12/20/2015   MCV 88.2 12/20/2015   PLT 359 12/20/2015   NEUTROABS 1.8 12/20/2015     ASSESSMENT & PLAN:  Breast cancer of upper-outer quadrant of left female breast ([facility]) Left breast biopsy 08/17/2015:1:30 position: Invasive ductal carcinoma, grade 3, ER 0%, PR 0%, HER-2 negative ratio 1.48, Ki-67 90%, 3.1 cm tumor, axilla negative, T2 N0 stage II a clinical stage  Treatment plan: 1. Neoadjuvant  chemotherapy with dose dense Adriamycin and Cytoxan 4 followed by Taxol and carboplatin weekly 12 2. Followed by surgery 3. Followed by adjuvant radiation Breast biopsies x 2: 09/28/2015 : PASH -------------------------------------------------------------------------------------------------------------------------------------------------- Current treatment: Completed Cycle 4 dose dense Adriamycin and Cytoxan on 10/31/15; completed cycle 3 Taxol and carboplatin; today is cycle 4, Taxol alone weekly  Echocardiogram 09/13/2015: EF 55-60% Labs were reviewed.  Normocytic anemia: work up revealed iron deficiency: Received IV iron treatment with chemotherapy Chemotoxicities: 1. Severe nausea and vomiting: We will administer IV Zofran today again because of nausea issues. 2. Hair loss 3. Conjunctivitis versus corneal abrasion: Resolved with steroid eyedrops 4. Insomnia: I encouraged her to take 2 Ativan once at bedtime. 5. Fever up to 100F: Resolved.  7. Dehydration: received IV fluids with normal saline along with Zofran. 8. Diarrhea: Improved with Imodium 9. Skin dryness leading to itching: I instructed her to use Vaseline on Aquaphor 10. Diarrhea alternating with constipation  Anemia:Hemoglobin electrophoresis 10/31/2015: Normal . Her daughter and her mother are all anemic. Her hemoglobin is 11.1 mg Hospitalization for intractable nausea vomiting diarrhea of 12/08/2015- 12/12/2015  Monitoring  very closely for chemotherapy toxicities. Return to clinic with cycle 6 of Taxol     No orders of the defined types were placed in this encounter.   The patient has a good understanding of the overall plan. she agrees with it. she will call with any problems that may develop before the next visit here.   Rulon Eisenmenger, MD 12/20/2015

## 2015-12-20 NOTE — Progress Notes (Signed)
Unable to get in to exam room prior to MD.  No assessment performed.  

## 2015-12-20 NOTE — Assessment & Plan Note (Signed)
Left breast biopsy 08/17/2015:1:30 position: Invasive ductal carcinoma, grade 3, ER 0%, PR 0%, HER-2 negative ratio 1.48, Ki-67 90%, 3.1 cm tumor, axilla negative, T2 N0 stage II a clinical stage  Treatment plan: 1. Neoadjuvant chemotherapy with dose dense Adriamycin and Cytoxan 4 followed by Taxol and carboplatin weekly 12 2. Followed by surgery 3. Followed by adjuvant radiation Breast biopsies x 2: 09/28/2015 : PASH -------------------------------------------------------------------------------------------------------------------------------------------------- Current treatment: Completed Cycle 4 dose dense Adriamycin and Cytoxan on 10/31/15; completed cycle 3 Taxol and carboplatin; today is cycle 4, Taxol alone weekly  Echocardiogram 09/13/2015: EF 55-60% Labs were reviewed.  Normocytic anemia: work up revealed iron deficiency: Received IV iron treatment with chemotherapy Chemotoxicities: 1. Severe nausea and vomiting: We will administer IV Zofran today again because of nausea issues. 2. Hair loss 3. Conjunctivitis versus corneal abrasion: Resolved with steroid eyedrops 4. Insomnia: I encouraged her to take 2 Ativan once at bedtime. 5. Fever up to 100F: Resolved.  7. Dehydration: received IV fluids with normal saline along with Zofran. 8. Diarrhea: Improved with Imodium 9. Skin dryness leading to itching: I instructed her to use Vaseline on Aquaphor 10. Diarrhea alternating with constipation  Anemia:Hemoglobin electrophoresis 10/31/2015: Normal . Her daughter and her mother are all anemic. Her hemoglobin is 11.1 mg Hospitalization for intractable nausea vomiting diarrhea of 12/08/2015- 12/12/2015  Monitoring very closely for chemotherapy toxicities. Return to clinic with cycle 6 of Taxol

## 2015-12-22 ENCOUNTER — Other Ambulatory Visit: Payer: Self-pay | Admitting: *Deleted

## 2015-12-22 ENCOUNTER — Ambulatory Visit (HOSPITAL_BASED_OUTPATIENT_CLINIC_OR_DEPARTMENT_OTHER): Payer: Federal, State, Local not specified - PPO

## 2015-12-22 ENCOUNTER — Other Ambulatory Visit: Payer: Self-pay

## 2015-12-22 ENCOUNTER — Other Ambulatory Visit: Payer: Self-pay | Admitting: Nurse Practitioner

## 2015-12-22 ENCOUNTER — Ambulatory Visit (HOSPITAL_BASED_OUTPATIENT_CLINIC_OR_DEPARTMENT_OTHER): Payer: Federal, State, Local not specified - PPO | Admitting: Nurse Practitioner

## 2015-12-22 VITALS — BP 117/63 | HR 103 | Temp 98.4°F

## 2015-12-22 DIAGNOSIS — R1084 Generalized abdominal pain: Secondary | ICD-10-CM | POA: Diagnosis not present

## 2015-12-22 DIAGNOSIS — E860 Dehydration: Secondary | ICD-10-CM | POA: Diagnosis not present

## 2015-12-22 DIAGNOSIS — C50412 Malignant neoplasm of upper-outer quadrant of left female breast: Secondary | ICD-10-CM

## 2015-12-22 DIAGNOSIS — D649 Anemia, unspecified: Secondary | ICD-10-CM

## 2015-12-22 MED ORDER — SODIUM CHLORIDE 0.9 % IV SOLN
Freq: Once | INTRAVENOUS | Status: AC
  Administered 2015-12-22: 15:00:00 via INTRAVENOUS

## 2015-12-22 MED ORDER — HYDROCODONE-ACETAMINOPHEN 5-325 MG PO TABS
2.0000 | ORAL_TABLET | Freq: Once | ORAL | Status: AC
  Administered 2015-12-22: 2 via ORAL

## 2015-12-22 MED ORDER — SODIUM CHLORIDE 0.9 % IV SOLN
Freq: Once | INTRAVENOUS | Status: AC
  Administered 2015-12-22: 15:00:00 via INTRAVENOUS
  Filled 2015-12-22: qty 4

## 2015-12-22 MED ORDER — HEPARIN SOD (PORK) LOCK FLUSH 100 UNIT/ML IV SOLN
500.0000 [IU] | Freq: Once | INTRAVENOUS | Status: AC | PRN
  Administered 2015-12-22: 500 [IU]
  Filled 2015-12-22: qty 5

## 2015-12-22 MED ORDER — SODIUM CHLORIDE 0.9 % IJ SOLN
10.0000 mL | INTRAMUSCULAR | Status: DC | PRN
  Administered 2015-12-22: 10 mL
  Filled 2015-12-22: qty 10

## 2015-12-22 MED ORDER — HYDROCODONE-ACETAMINOPHEN 5-325 MG PO TABS
ORAL_TABLET | ORAL | Status: AC
  Filled 2015-12-22: qty 2

## 2015-12-22 NOTE — Patient Instructions (Signed)

## 2015-12-22 NOTE — Progress Notes (Signed)
After de accessing patient's port , she reported having stomach pain. She had told one of the other nurses earlier. Cyndee to come and see patient. Patient given 2 Vicodin.

## 2015-12-26 ENCOUNTER — Encounter: Payer: Self-pay | Admitting: Nurse Practitioner

## 2015-12-26 ENCOUNTER — Telehealth: Payer: Self-pay

## 2015-12-26 ENCOUNTER — Telehealth: Payer: Self-pay | Admitting: *Deleted

## 2015-12-26 NOTE — Assessment & Plan Note (Signed)
Patient received cycle 4 of her Taxol/carboplatin chemotherapy regimen on 12/20/2015. Patient is scheduled to return on 12/27/2015 for labs, visit, and next Cycle of chemotherapy.

## 2015-12-26 NOTE — Assessment & Plan Note (Signed)
Patient had completed her IV fluid rehydration  This afternoon; when she began to complain of some generalized abdominal discomfort.  Patient states she typically takes tramadol for her chronic abdominal discomfort; but had not taken any in quite some time.   Patient denied any nausea or vomiting today.  She also denied any recent fevers or chills.    patient requested and was given 2 Vicodin tablets while at the cancer center.  She  Stated that she had tramadol at home to take on an as-needed basis.  Patient was discharged home with her family.   Patient was advised to go directly to the emergency department for any worsening symptoms whatsoever.

## 2015-12-26 NOTE — Assessment & Plan Note (Signed)
Patient has been experiencing some intermittent nausea/vomiting.  Since her last cycle of chemotherapy.  She presented to the [facility] today to receive IV fluid rehydration.  She was also given Zofran IV while the cancer Center as well.  patient was advised to go directly to the emergency department for any worsening symptoms whatsoever.

## 2015-12-26 NOTE — Telephone Encounter (Signed)
Called patient back regarding message received from triage.  Pt states pain is 10-10 to right side under breast and "shooting down."  Pt states this is not a new pain but is more severe than in the past.  Pt denies any other symptoms such as increased shortness of breath, dizziness, sweating.  Pt states she was taking hydrocodone with her tramadol starting Friday when the pain got worse and that made it manageable but is since out of the hydrocodone and it is now excruciating.  Spoke with Dr. Lindi Adie and reviewed symptoms with him.  Per his recommendation, pt instructed to take motrin and tylenol intermittently.  Pt will be seen by [facility] tomorrow to follow up on this pain.  Pt instructed to go to the ED should she develop any new or worsening symptoms which were reviewed with her.  Pt verbalized understanding.

## 2015-12-26 NOTE — Telephone Encounter (Signed)
Patient called stating that she is having excruciating pain to her right breast under her side when breathing. She states that it feels like someone is "punching her in the side". This pain is not new, but it seems like its getting worse. Patient is to see [facility] tomorrow, but wants to know if she should come in today. Message sent to MD Lawerance Bach

## 2015-12-26 NOTE — Progress Notes (Signed)
SYMPTOM MANAGEMENT CLINIC    Chief Complaint: abdominal pain  HPI:  Cindy Bradshaw 45 y.o. female diagnosed with breast cancer.  Currently undergoing Taxol/carboplatin chemotherapy therapy regimen.    Patient had completed her IV fluid rehydration  This afternoon; when she began to complain of some generalized abdominal discomfort.  Patient states she typically takes tramadol for her chronic abdominal discomfort; but had not taken any in quite some time.   Patient denied any nausea or vomiting today.  She also denied any recent fevers or chills.    patient requested and was given 2 Vicodin tablets while at the cancer center.  She  Stated that she had tramadol at home to take on an as-needed basis.  Patient was discharged home with her family.   Patient was advised to go directly to the emergency department for any worsening symptoms whatsoever.    Breast cancer of upper-outer quadrant of left female breast ([place])   08/17/2015 Initial Diagnosis left breast biopsy 1:30 position: Invasive ductal carcinoma, grade 3, ER 0%, PR 0%, HER-2 negative ratio 1.48, Ki-67 90%, 3.1 cm tumor, axilla negative, T2 N0 stage II a clinical stage   09/19/2015 -  Neo-Adjuvant Chemotherapy Neoadjuvant chemotherapy with dose dense Adriamycin and Cytoxan 4 followed by Taxol and carboplatin weekly 12   09/28/2015 Procedure  Left breast biopsy anterior third left upper outer quadrant: PASH;  upper inner quadrant: PASH    Review of Systems  Gastrointestinal: Positive for abdominal pain.  All other systems reviewed and are negative.   Past Medical History  Diagnosis Date  . Cancer ([place])   . Migraine   . Asthma   . Breast cancer ([place])   . Arthritis   . Back disorder     degenerative disk disease  . Pap smear for cervical cancer screening 08/12/15  . Mammogram abnormal 08/24/15    first  . GERD (gastroesophageal reflux disease)     Past Surgical History  Procedure Laterality Date  . Cholecystectomy     . Cesarean section    . Portacath placement [name] 09/12/2015    Procedure: INSERTION PORT-A-CATH;  Surgeon: Excell Seltzer, MD;  Location: [facility];  Service: General;  Laterality: [name];    has Breast cancer of upper-outer quadrant of left female breast ([place]); Normocytic normochromic anemia; Family history of breast cancer in female; Dehydration; Chemotherapy induced nausea and vomiting; Genetic testing; Chemotherapy-induced peripheral neuropathy ([place]); Asthma; Nausea vomiting and diarrhea; Abdominal pain; Antineoplastic chemotherapy induced anemia; Leukopenia due to antineoplastic chemotherapy; Vomiting and diarrhea; Nausea & vomiting; and Adnexal cyst: Right per CT 12/09/2015 on her problem list.    is allergic to tape; dilaudid; ivp dye; percocet; and toradol.    Medication List       This list is accurate as of: 12/22/15 11:59 PM.  Always use your most recent med list.               diphenoxylate-atropine 2.5-0.025 MG tablet  Commonly known as:  LOMOTIL  Take 2 tablets by mouth 4 (four) times daily as needed for diarrhea or loose stools.     lidocaine-prilocaine cream  Commonly known as:  EMLA  Apply to affected area as directed.     LORazepam 0.5 MG tablet  Commonly known as:  ATIVAN  Take 1 tablet (0.5 mg total) by mouth at bedtime.     ondansetron 8 MG tablet  Commonly known as:  ZOFRAN  Take 1 tablet (8 mg total) by mouth every 8 (eight)  hours as needed for nausea or vomiting.     PROAIR HFA 108 (90 Base) MCG/ACT inhaler  Generic drug:  albuterol  Inhale 2 puffs into the lungs every 4 (four) hours as needed. Reported on 12/13/2015     prochlorperazine 10 MG tablet  Commonly known as:  COMPAZINE  Take 1 tablet (10 mg total) by mouth every 6 (six) hours as needed for nausea or vomiting.     traMADol 50 MG tablet  Commonly known as:  ULTRAM  Take 1 tablet (50 mg total) by mouth every 6 (six) hours as needed for moderate pain or severe pain.     triamcinolone cream 0.1 %    Commonly known as:  KENALOG  Apply 1 application topically 2 (two) times daily.         PHYSICAL EXAMINATION  Oncology Vitals 12/22/2015 12/20/2015  Height - 165 cm  Weight - 112.991 kg  Weight (lbs) - 249 lbs 2 oz  BMI (kg/m2) - 41.45 kg/m2  Temp 98.4 98.4  Pulse 103 90  Resp - 19  SpO2 100 100  BSA (m2) - 2.28 m2   BP Readings from Last 2 Encounters:  12/22/15 117/63  12/20/15 133/80    Physical Exam  Constitutional: She is oriented to person, place, and time and well-developed, well-nourished, and in no distress.  HENT:  Head: Normocephalic and atraumatic.  Eyes: Conjunctivae and EOM are normal. Pupils are equal, round, and reactive to light. Right eye exhibits no discharge. Left eye exhibits no discharge. No scleral icterus.  Neck: Normal range of motion.  Pulmonary/Chest: Effort normal. No respiratory distress.  Abdominal: Soft. There is no tenderness.  Musculoskeletal: Normal range of motion.  Neurological: She is alert and oriented to person, place, and time.  Psychiatric:  Anxious  Nursing note and vitals reviewed.   LABORATORY DATA:. Appointment on 12/20/2015  Component Date Value Ref Range Status  . WBC 12/20/2015 4.0  3.9 - 10.3 10e3/uL Final  . NEUT# 12/20/2015 1.8  1.5 - 6.5 10e3/uL Final  . HGB 12/20/2015 11.2* 11.6 - 15.9 g/dL Final  . HCT 12/20/2015 34.8  34.8 - 46.6 % Final  . Platelets 12/20/2015 359  145 - 400 10e3/uL Final  . MCV 12/20/2015 88.2  79.5 - 101.0 fL Final  . MCH 12/20/2015 28.5  25.1 - 34.0 pg Final  . MCHC 12/20/2015 32.3  31.5 - 36.0 g/dL Final  . RBC 12/20/2015 3.94  3.70 - 5.45 10e6/uL Final  . RDW 12/20/2015 21.4* 11.2 - 14.5 % Final  . lymph# 12/20/2015 1.4  0.9 - 3.3 10e3/uL Final  . MONO# 12/20/2015 0.6  0.1 - 0.9 10e3/uL Final  . Eosinophils Absolute 12/20/2015 0.2  0.0 - 0.5 10e3/uL Final  . Basophils Absolute 12/20/2015 0.0  0.0 - 0.1 10e3/uL Final  . NEUT% 12/20/2015 46.4  38.4 - 76.8 % Final  . LYMPH% 12/20/2015  35.2  14.0 - 49.7 % Final  . MONO% 12/20/2015 14.0  0.0 - 14.0 % Final  . EOS% 12/20/2015 3.8  0.0 - 7.0 % Final  . BASO% 12/20/2015 0.6  0.0 - 2.0 % Final  . Sodium 12/20/2015 141  136 - 145 mEq/L Final  . Potassium 12/20/2015 3.7  3.5 - 5.1 mEq/L Final  . Chloride 12/20/2015 110* 98 - 109 mEq/L Final  . CO2 12/20/2015 25  22 - 29 mEq/L Final  . Glucose 12/20/2015 98  70 - 140 mg/dl Final   Glucose reference range is for nonfasting patients. Fasting  glucose reference range is 70- 100.  Marland Kitchen BUN 12/20/2015 9.4  7.0 - 26.0 mg/dL Final  . Creatinine 12/20/2015 0.7  0.6 - 1.1 mg/dL Final  . Total Bilirubin 12/20/2015 0.35  0.20 - 1.20 mg/dL Final  . Alkaline Phosphatase 12/20/2015 61  40 - 150 U/L Final  . AST 12/20/2015 40* 5 - 34 U/L Final  . ALT 12/20/2015 74* 0 - 55 U/L Final  . Total Protein 12/20/2015 6.9  6.4 - 8.3 g/dL Final  . Albumin 12/20/2015 3.3* 3.5 - 5.0 g/dL Final  . Calcium 12/20/2015 9.9  8.4 - 10.4 mg/dL Final  . Anion Gap 12/20/2015 6  3 - 11 mEq/L Final  . EGFR 12/20/2015 >90  >90 ml/min/1.73 m2 Final   eGFR is calculated using the CKD-EPI Creatinine Equation (2009)    RADIOGRAPHIC STUDIES: No results found.  ASSESSMENT/PLAN:    Abdominal pain  Patient had completed her IV fluid rehydration  This afternoon; when she began to complain of some generalized abdominal discomfort.  Patient states she typically takes tramadol for her chronic abdominal discomfort; but had not taken any in quite some time.   Patient denied any nausea or vomiting today.  She also denied any recent fevers or chills.    patient requested and was given 2 Vicodin tablets while at the cancer center.  She  Stated that she had tramadol at home to take on an as-needed basis.  Patient was discharged home with her family.   Patient was advised to go directly to the emergency department for any worsening symptoms whatsoever.  Breast cancer of upper-outer quadrant of left female breast [facility])  Patient  received cycle 4 of her Taxol/carboplatin chemotherapy regimen on 12/20/2015. Patient is scheduled to return on 12/27/2015 for labs, visit, and next Cycle of chemotherapy.  Dehydration  Patient has been experiencing some intermittent nausea/vomiting.  Since her last cycle of chemotherapy.  She presented to the [facility] today to receive IV fluid rehydration.  She was also given Zofran IV while the cancer Center as well.  patient was advised to go directly to the emergency department for any worsening symptoms whatsoever.   Patient stated understanding of all instructions; and was in agreement with this plan of care. The patient knows to call the clinic with any problems, questions or concerns.   Total time spent with patient was15 minutes;  with greater than 75 percent of that time spent in face to face counseling regarding patient's symptoms,  and coordination of care and follow up.  Disclaimer:This dictation was prepared with Dragon/digital dictation along with [employer]. Any transcriptional errors that result from this process are unintentional.  Drue Second, NP 12/26/2015

## 2015-12-27 ENCOUNTER — Other Ambulatory Visit (HOSPITAL_BASED_OUTPATIENT_CLINIC_OR_DEPARTMENT_OTHER): Payer: Federal, State, Local not specified - PPO

## 2015-12-27 ENCOUNTER — Telehealth: Payer: Self-pay | Admitting: Hematology and Oncology

## 2015-12-27 ENCOUNTER — Encounter: Payer: Self-pay | Admitting: Hematology and Oncology

## 2015-12-27 ENCOUNTER — Ambulatory Visit (HOSPITAL_BASED_OUTPATIENT_CLINIC_OR_DEPARTMENT_OTHER): Payer: Federal, State, Local not specified - PPO | Admitting: Hematology and Oncology

## 2015-12-27 ENCOUNTER — Ambulatory Visit (HOSPITAL_BASED_OUTPATIENT_CLINIC_OR_DEPARTMENT_OTHER): Payer: Federal, State, Local not specified - PPO

## 2015-12-27 ENCOUNTER — Other Ambulatory Visit: Payer: Self-pay

## 2015-12-27 VITALS — BP 126/95 | HR 94 | Temp 98.1°F | Resp 20 | Wt 250.6 lb

## 2015-12-27 DIAGNOSIS — R0789 Other chest pain: Secondary | ICD-10-CM | POA: Diagnosis not present

## 2015-12-27 DIAGNOSIS — C50412 Malignant neoplasm of upper-outer quadrant of left female breast: Secondary | ICD-10-CM

## 2015-12-27 DIAGNOSIS — D649 Anemia, unspecified: Secondary | ICD-10-CM | POA: Diagnosis not present

## 2015-12-27 DIAGNOSIS — Z5111 Encounter for antineoplastic chemotherapy: Secondary | ICD-10-CM | POA: Diagnosis not present

## 2015-12-27 DIAGNOSIS — R079 Chest pain, unspecified: Secondary | ICD-10-CM | POA: Insufficient documentation

## 2015-12-27 DIAGNOSIS — R071 Chest pain on breathing: Secondary | ICD-10-CM

## 2015-12-27 LAB — COMPREHENSIVE METABOLIC PANEL
ALBUMIN: 3.4 g/dL — AB (ref 3.5–5.0)
ALK PHOS: 66 U/L (ref 40–150)
ALT: 70 U/L — ABNORMAL HIGH (ref 0–55)
AST: 37 U/L — ABNORMAL HIGH (ref 5–34)
Anion Gap: 6 mEq/L (ref 3–11)
BUN: 11.1 mg/dL (ref 7.0–26.0)
CALCIUM: 9.9 mg/dL (ref 8.4–10.4)
CO2: 26 mEq/L (ref 22–29)
Chloride: 108 mEq/L (ref 98–109)
Creatinine: 0.7 mg/dL (ref 0.6–1.1)
GLUCOSE: 110 mg/dL (ref 70–140)
Potassium: 3.5 mEq/L (ref 3.5–5.1)
SODIUM: 140 meq/L (ref 136–145)
TOTAL PROTEIN: 7 g/dL (ref 6.4–8.3)

## 2015-12-27 LAB — CBC WITH DIFFERENTIAL/PLATELET
BASO%: 0.7 % (ref 0.0–2.0)
BASOS ABS: 0 10*3/uL (ref 0.0–0.1)
EOS ABS: 0.1 10*3/uL (ref 0.0–0.5)
EOS%: 2.6 % (ref 0.0–7.0)
HEMATOCRIT: 36.5 % (ref 34.8–46.6)
HEMOGLOBIN: 11.8 g/dL (ref 11.6–15.9)
LYMPH#: 1.3 10*3/uL (ref 0.9–3.3)
LYMPH%: 27.3 % (ref 14.0–49.7)
MCH: 29.2 pg (ref 25.1–34.0)
MCHC: 32.4 g/dL (ref 31.5–36.0)
MCV: 90.1 fL (ref 79.5–101.0)
MONO#: 0.3 10*3/uL (ref 0.1–0.9)
MONO%: 6.6 % (ref 0.0–14.0)
NEUT%: 62.8 % (ref 38.4–76.8)
NEUTROS ABS: 3 10*3/uL (ref 1.5–6.5)
PLATELETS: 450 10*3/uL — AB (ref 145–400)
RBC: 4.05 10*6/uL (ref 3.70–5.45)
RDW: 19.9 % — AB (ref 11.2–14.5)
WBC: 4.8 10*3/uL (ref 3.9–10.3)

## 2015-12-27 MED ORDER — HYDROCODONE-ACETAMINOPHEN 5-300 MG PO TABS: 1.0000 | ORAL_TABLET | Freq: Three times a day (TID) | ORAL | Status: DC | PRN

## 2015-12-27 MED ORDER — DIPHENHYDRAMINE HCL 50 MG/ML IJ SOLN
INTRAMUSCULAR | Status: AC
  Filled 2015-12-27: qty 1

## 2015-12-27 MED ORDER — PREDNISONE 50 MG PO TABS: 50.0000 mg | ORAL_TABLET | ORAL | Status: DC

## 2015-12-27 MED ORDER — SODIUM CHLORIDE 0.9 % IJ SOLN
10.0000 mL | INTRAMUSCULAR | Status: DC | PRN
  Administered 2015-12-27: 10 mL
  Filled 2015-12-27: qty 10

## 2015-12-27 MED ORDER — HYDROCODONE-ACETAMINOPHEN 5-325 MG PO TABS
ORAL_TABLET | ORAL | Status: AC
  Filled 2015-12-27: qty 1

## 2015-12-27 MED ORDER — PALONOSETRON HCL INJECTION 0.25 MG/5ML
0.2500 mg | Freq: Once | INTRAVENOUS | Status: AC
  Administered 2015-12-27: 0.25 mg via INTRAVENOUS

## 2015-12-27 MED ORDER — SODIUM CHLORIDE 0.9 % IV SOLN
50.0000 mg/m2 | Freq: Once | INTRAVENOUS | Status: AC
  Administered 2015-12-27: 114 mg via INTRAVENOUS
  Filled 2015-12-27: qty 19

## 2015-12-27 MED ORDER — FAMOTIDINE IN NACL 20-0.9 MG/50ML-% IV SOLN
20.0000 mg | Freq: Once | INTRAVENOUS | Status: AC
  Administered 2015-12-27: 20 mg via INTRAVENOUS

## 2015-12-27 MED ORDER — SODIUM CHLORIDE 0.9 % IV SOLN
Freq: Once | INTRAVENOUS | Status: AC
  Administered 2015-12-27: 14:00:00 via INTRAVENOUS
  Filled 2015-12-27: qty 1

## 2015-12-27 MED ORDER — PALONOSETRON HCL INJECTION 0.25 MG/5ML
INTRAVENOUS | Status: AC
  Filled 2015-12-27: qty 5

## 2015-12-27 MED ORDER — DIPHENHYDRAMINE HCL 50 MG/ML IJ SOLN
25.0000 mg | Freq: Once | INTRAMUSCULAR | Status: AC
  Administered 2015-12-27: 25 mg via INTRAVENOUS

## 2015-12-27 MED ORDER — HEPARIN SOD (PORK) LOCK FLUSH 100 UNIT/ML IV SOLN
500.0000 [IU] | Freq: Once | INTRAVENOUS | Status: AC | PRN
  Administered 2015-12-27: 500 [IU]
  Filled 2015-12-27: qty 5

## 2015-12-27 MED ORDER — FAMOTIDINE IN NACL 20-0.9 MG/50ML-% IV SOLN
INTRAVENOUS | Status: AC
  Filled 2015-12-27: qty 50

## 2015-12-27 MED ORDER — SODIUM CHLORIDE 0.9 % IV SOLN
Freq: Once | INTRAVENOUS | Status: AC
  Administered 2015-12-27: 14:00:00 via INTRAVENOUS

## 2015-12-27 MED ORDER — HYDROCODONE-ACETAMINOPHEN 5-325 MG PO TABS
1.0000 | ORAL_TABLET | Freq: Once | ORAL | Status: AC
  Administered 2015-12-27: 1 via ORAL

## 2015-12-27 NOTE — Progress Notes (Signed)
Patient Care Team: [name], MD as PCP - General (Internal Medicine)  SUMMARY OF ONCOLOGIC HISTORY:   Breast cancer of upper-outer quadrant of left female breast ([place])   08/17/2015 Initial Diagnosis left breast biopsy 1:30 position: Invasive ductal carcinoma, grade 3, ER 0%, PR 0%, HER-2 negative ratio 1.48, Ki-67 90%, 3.1 cm tumor, axilla negative, T2 N0 stage II a clinical stage   09/19/2015 -  Neo-Adjuvant Chemotherapy Neoadjuvant chemotherapy with dose dense Adriamycin and Cytoxan 4 followed by Taxol and carboplatin weekly 12   09/28/2015 Procedure  Left breast biopsy anterior third left upper outer quadrant: PASH;  upper inner quadrant: PASH    CHIEF COMPLIANT: Cycle 5 Taxol in complaining of bilateral chest wall pains  INTERVAL HISTORY: Cindy Bradshaw is a 45 year old with above-mentioned history left breast cancer currently on neoadjuvant chemotherapy. Today is cycle 5 of Taxol. She came in complaining of bilateral chest wall and rib pains towards the lateral side radiating to the back. She reports been going on for the past 1 week. This is causing her significant pain that over-the-counter pain medications and her tramadol were not helping.  REVIEW OF SYSTEMS:   Constitutional: Denies fevers, chills or abnormal weight loss Eyes: Denies blurriness of vision Ears, nose, mouth, throat, and face: Denies mucositis or sore throat Respiratory: Denies cough, dyspnea or wheezes, bilateral postero-lateral chest wall pains Cardiovascular: Denies palpitation, chest discomfort Gastrointestinal:  Denies nausea, heartburn or change in bowel habits Skin: Denies abnormal skin rashes Lymphatics: Denies new lymphadenopathy or easy bruising Neurological:Denies numbness, tingling or new weaknesses Behavioral/Psych: Mood is stable, no new changes  Extremities: No lower extremity edema  All other systems were reviewed with the patient and are negative.  I have reviewed the past  medical history, past surgical history, social history and family history with the patient and they are unchanged from previous note.  ALLERGIES:  is allergic to tape; dilaudid; ivp dye; percocet; and toradol.  MEDICATIONS:  Current Outpatient Prescriptions  Medication Sig Dispense Refill  . diphenoxylate-atropine (LOMOTIL) 2.5-0.025 MG tablet Take 2 tablets by mouth 4 (four) times daily as needed for diarrhea or loose stools. (Patient not taking: Reported on 12/13/2015) 30 tablet 0  . Hydrocodone-Acetaminophen 5-300 MG TABS Take 1 tablet by mouth every 8 (eight) hours as needed (Chest and rib pain). 60 each 0  . lidocaine-prilocaine (EMLA) cream Apply to affected area as directed.  3  . LORazepam (ATIVAN) 0.5 MG tablet Take 1 tablet (0.5 mg total) by mouth at bedtime. 30 tablet 0  . ondansetron (ZOFRAN) 8 MG tablet Take 1 tablet (8 mg total) by mouth every 8 (eight) hours as needed for nausea or vomiting. 30 tablet 1  . predniSONE (DELTASONE) 50 MG tablet Take 1 tablet (50 mg total) by mouth as directed. Take 1m at midnight, 6:00am, and 12:00pm. 3 tablet 0  . PROAIR HFA 108 (90 BASE) MCG/ACT inhaler Inhale 2 puffs into the lungs every 4 (four) hours as needed. Reported on 12/13/2015  3  . prochlorperazine (COMPAZINE) 10 MG tablet Take 1 tablet (10 mg total) by mouth every 6 (six) hours as needed for nausea or vomiting. 30 tablet 0  . traMADol (ULTRAM) 50 MG tablet Take 1 tablet (50 mg total) by mouth every 6 (six) hours as needed for moderate pain or severe pain. 20 tablet 0  . triamcinolone cream (KENALOG) 0.1 % Apply 1 application topically 2 (two) times daily. 160 g 1   No current facility-administered medications for this  visit.   Facility-Administered Medications Ordered in Other Visits  Medication Dose Route Frequency Provider Last Rate Last Dose  . 0.9 %  sodium chloride infusion   Intravenous Once Laurie Panda, NP      . sodium chloride 0.9 % injection 10 mL  10 mL Intracatheter  PRN Nicholas Lose, MD   10 mL at 12/27/15 1544    PHYSICAL EXAMINATION: ECOG PERFORMANCE STATUS: 1 - Symptomatic but completely ambulatory  Filed Vitals:   12/27/15 1318  BP: 126/95  Pulse: 94  Temp: 98.1 F (36.7 C)  Resp: 20   Filed Weights   12/27/15 1318  Weight: 250 lb 9.6 oz (113.671 kg)    GENERAL:alert, no distress and comfortable SKIN: skin color, texture, turgor are normal, no rashes or significant lesions EYES: normal, Conjunctiva are pink and non-injected, sclera clear OROPHARYNX:no exudate, no erythema and lips, buccal mucosa, and tongue normal  NECK: supple, thyroid normal size, non-tender, without nodularity LYMPH:  no palpable lymphadenopathy in the cervical, axillary or inguinal LUNGS: clear to auscultation and percussion with normal breathing effort HEART: regular rate & rhythm and no murmurs and no lower extremity edema ABDOMEN:abdomen soft, non-tender and normal bowel sounds MUSCULOSKELETAL:no cyanosis of digits and no clubbing  NEURO: alert & oriented x 3 with fluent speech, no focal motor/sensory deficits EXTREMITIES: No lower extremity edema  LABORATORY DATA:  I have reviewed the data as listed   Chemistry      Component Value Date/Time   NA 140 12/27/2015 1301   NA 141 12/12/2015 0332   K 3.5 12/27/2015 1301   K 3.7 12/12/2015 0332   CL 110 12/12/2015 0332   CO2 26 12/27/2015 1301   CO2 24 12/12/2015 0332   BUN 11.1 12/27/2015 1301   BUN 6 12/12/2015 0332   CREATININE 0.7 12/27/2015 1301   CREATININE 0.61 12/12/2015 0332      Component Value Date/Time   CALCIUM 9.9 12/27/2015 1301   CALCIUM 9.6 12/12/2015 0332   ALKPHOS 66 12/27/2015 1301   ALKPHOS 53 12/09/2015 0452   AST 37* 12/27/2015 1301   AST 178* 12/09/2015 0452   ALT 70* 12/27/2015 1301   ALT 142* 12/09/2015 0452   BILITOT <0.30 12/27/2015 1301   BILITOT 1.0 12/09/2015 0452       Lab Results  Component Value Date   WBC 4.8 12/27/2015   HGB 11.8 12/27/2015   HCT 36.5  12/27/2015   MCV 90.1 12/27/2015   PLT 450* 12/27/2015   NEUTROABS 3.0 12/27/2015   ASSESSMENT & PLAN:  Breast cancer of upper-outer quadrant of left female breast ([facility]) Left breast biopsy 08/17/2015:1:30 position: Invasive ductal carcinoma, grade 3, ER 0%, PR 0%, HER-2 negative ratio 1.48, Ki-67 90%, 3.1 cm tumor, axilla negative, T2 N0 stage II a clinical stage  Treatment plan: 1. Neoadjuvant chemotherapy with dose dense Adriamycin and Cytoxan 4 followed by Taxol and carboplatin weekly 12 2. Followed by surgery 3. Followed by adjuvant radiation Breast biopsies x 2: 09/28/2015 : PASH -------------------------------------------------------------------------------------------------------------------------------------------------- Current treatment: Completed Cycle 4 dose dense Adriamycin and Cytoxan on 10/31/15; completed cycle 3 Taxol and carboplatin; today is cycle 5, Taxol alone weekly  Echocardiogram 09/13/2015: EF 55-60% Labs were reviewed.  Normocytic anemia: work up revealed iron deficiency: Received IV iron treatment with chemotherapy Chemotoxicities: 1. Severe nausea and vomiting: We will administer IV Zofran today again because of nausea issues. 2. Hair loss 3. Conjunctivitis versus corneal abrasion: Resolved with steroid eyedrops 4. Insomnia: I encouraged her to take 2  Ativan once at bedtime. 5. Fever up to 100F: Resolved.  7. Dehydration: received IV fluids with normal saline along with Zofran. 8. Diarrhea: Improved with Imodium 9. Skin dryness leading to itching: I instructed her to use Vaseline on Aquaphor 10. Diarrhea alternating with constipation 11. Bilateral chest wall pains: I would like to obtain a CT of the chest with PE protocol to assess if she has any blood clots. Because she has a contrast allergy we will use allergy protocol and obtain the CT scan for tomorrow. I gave her a prescription for Vicodin because of tramadol and Motrin were not  helping.  Anemia:Hemoglobin electrophoresis 10/31/2015: Normal . Her daughter and her mother are all anemic. Her hemoglobin is 11.1 mg Hospitalization for intractable nausea vomiting diarrhea of 12/08/2015- 12/12/2015  Monitoring very closely for chemotherapy toxicities. Return to clinic with cycle 6 of Taxol     Orders Placed This Encounter  Procedures  . CT Angio Chest PE W/Cm &/Or Wo Cm    Standing Status: Future     Number of Occurrences:      Standing Expiration Date: 01/30/2017    Order Specific Question:  Reason for exam:    Answer:  Bilateral chest pains with rbeast cancer    Order Specific Question:  Is the patient pregnant?    Answer:  No    Order Specific Question:  Preferred imaging location?    Answer:  [facility] [place]   The patient has a good understanding of the overall plan. she agrees with it. she will call with any problems that may develop before the next visit here.   Rulon Eisenmenger, MD 12/27/2015

## 2015-12-27 NOTE — Assessment & Plan Note (Signed)
Left breast biopsy 08/17/2015:1:30 position: Invasive ductal carcinoma, grade 3, ER 0%, PR 0%, HER-2 negative ratio 1.48, Ki-67 90%, 3.1 cm tumor, axilla negative, T2 N0 stage II a clinical stage  Treatment plan: 1. Neoadjuvant chemotherapy with dose dense Adriamycin and Cytoxan 4 followed by Taxol and carboplatin weekly 12 2. Followed by surgery 3. Followed by adjuvant radiation Breast biopsies x 2: 09/28/2015 : PASH -------------------------------------------------------------------------------------------------------------------------------------------------- Current treatment: Completed Cycle 4 dose dense Adriamycin and Cytoxan on 10/31/15; completed cycle 3 Taxol and carboplatin; today is cycle 5, Taxol alone weekly  Echocardiogram 09/13/2015: EF 55-60% Labs were reviewed.  Normocytic anemia: work up revealed iron deficiency: Received IV iron treatment with chemotherapy Chemotoxicities: 1. Severe nausea and vomiting: We will administer IV Zofran today again because of nausea issues. 2. Hair loss 3. Conjunctivitis versus corneal abrasion: Resolved with steroid eyedrops 4. Insomnia: I encouraged her to take 2 Ativan once at bedtime. 5. Fever up to 100F: Resolved.  7. Dehydration: received IV fluids with normal saline along with Zofran. 8. Diarrhea: Improved with Imodium 9. Skin dryness leading to itching: I instructed her to use Vaseline on Aquaphor 10. Diarrhea alternating with constipation  Anemia:Hemoglobin electrophoresis 10/31/2015: Normal . Her daughter and her mother are all anemic. Her hemoglobin is 11.1 mg Hospitalization for intractable nausea vomiting diarrhea of 12/08/2015- 12/12/2015  Monitoring very closely for chemotherapy toxicities. Return to clinic with cycle 6 of Taxol

## 2015-12-27 NOTE — Progress Notes (Signed)
Spoke with pt in chemo room regarding CT scan ordered for tomorrow (4/26).  Informed patient of the date and time of the scan as well as the instructions for premeds.  Pt to take Prednisone 50mg  13hrs, 7hrs, and 1 hr before CT scan d/t contrast dye allergy as well as Benadryl 50mg  PO 1 hr prior to scan.  Gave pt written instructions and pt verbalized understanding.  Prescription called in to pts pharmacy and pt instructed to pick up after chemo visit.  No further questions or concerns at this time.  Information also relayed to [place], Therapist, sports.

## 2015-12-27 NOTE — Patient Instructions (Signed)
[facility] Discharge Instructions for Patients Receiving Chemotherapy  Today you received the following chemotherapy agents:  Taxol  To help prevent nausea and vomiting after your treatment, we encourage you to take your nausea medication as prescribed.   If you develop nausea and vomiting that is not controlled by your nausea medication, call the clinic.   BELOW ARE SYMPTOMS THAT SHOULD BE REPORTED IMMEDIATELY:  *FEVER GREATER THAN 100.5 F  *CHILLS WITH OR WITHOUT FEVER  NAUSEA AND VOMITING THAT IS NOT CONTROLLED WITH YOUR NAUSEA MEDICATION  *UNUSUAL SHORTNESS OF BREATH  *UNUSUAL BRUISING OR BLEEDING  TENDERNESS IN MOUTH AND THROAT WITH OR WITHOUT PRESENCE OF ULCERS  *URINARY PROBLEMS  *BOWEL PROBLEMS  UNUSUAL RASH Items with * indicate a potential emergency and should be followed up as soon as possible.  Feel free to call the clinic you have any questions or concerns. The clinic phone number is [phone].  Please show the CHEMO ALERT CARD at check-in to the Emergency Department and triage nurse.   

## 2015-12-27 NOTE — Telephone Encounter (Signed)
appt made and avs will print in treatment room °

## 2015-12-28 ENCOUNTER — Ambulatory Visit (HOSPITAL_COMMUNITY)
Admission: RE | Admit: 2015-12-28 | Discharge: 2015-12-28 | Disposition: A | Discharge status: Home / Self Care | Payer: Federal, State, Local not specified - PPO | Source: Ambulatory Visit | Attending: Hematology and Oncology | Admitting: Hematology and Oncology

## 2015-12-28 ENCOUNTER — Encounter (HOSPITAL_COMMUNITY): Payer: Self-pay

## 2015-12-28 DIAGNOSIS — C50919 Malignant neoplasm of unspecified site of unspecified female breast: Secondary | ICD-10-CM | POA: Diagnosis present

## 2015-12-28 DIAGNOSIS — R938 Abnormal findings on diagnostic imaging of other specified body structures: Secondary | ICD-10-CM | POA: Insufficient documentation

## 2015-12-28 DIAGNOSIS — R071 Chest pain on breathing: Secondary | ICD-10-CM | POA: Diagnosis not present

## 2015-12-28 DIAGNOSIS — K449 Diaphragmatic hernia without obstruction or gangrene: Secondary | ICD-10-CM | POA: Diagnosis not present

## 2015-12-28 MED ORDER — IOPAMIDOL (ISOVUE-370) INJECTION 76%
100.0000 mL | Freq: Once | INTRAVENOUS | Status: AC | PRN
  Administered 2015-12-28: 100 mL via INTRAVENOUS

## 2015-12-28 MED ORDER — IOPAMIDOL (ISOVUE-300) INJECTION 61%: 100.0000 mL | Freq: Once | INTRAVENOUS | Status: DC | PRN

## 2015-12-29 ENCOUNTER — Ambulatory Visit (HOSPITAL_BASED_OUTPATIENT_CLINIC_OR_DEPARTMENT_OTHER): Payer: Federal, State, Local not specified - PPO

## 2015-12-29 DIAGNOSIS — R110 Nausea: Secondary | ICD-10-CM | POA: Diagnosis not present

## 2015-12-29 DIAGNOSIS — C50412 Malignant neoplasm of upper-outer quadrant of left female breast: Secondary | ICD-10-CM | POA: Diagnosis not present

## 2015-12-29 DIAGNOSIS — D649 Anemia, unspecified: Secondary | ICD-10-CM

## 2015-12-29 DIAGNOSIS — E860 Dehydration: Secondary | ICD-10-CM

## 2015-12-29 DIAGNOSIS — R109 Unspecified abdominal pain: Secondary | ICD-10-CM

## 2015-12-29 MED ORDER — SODIUM CHLORIDE 0.9 % IV SOLN
Freq: Once | INTRAVENOUS | Status: AC
  Administered 2015-12-29: 14:00:00 via INTRAVENOUS

## 2015-12-29 NOTE — Progress Notes (Signed)
Pt states she has generalized abdominal pain, and mild nausea, states she has pains meds that she will take during infusion

## 2015-12-29 NOTE — Patient Instructions (Addendum)
Implanted Port Home Guide An implanted port is a type of central line that is placed under the skin. Central lines are used to provide IV access when treatment or nutrition needs to be given through a person's veins. Implanted ports are used for long-term IV access. An implanted port may be placed because:   You need IV medicine that would be irritating to the small veins in your hands or arms.   You need long-term IV medicines, such as antibiotics.   You need IV nutrition for a long period.   You need frequent blood draws for lab tests.   You need dialysis.  Implanted ports are usually placed in the chest area, but they can also be placed in the upper arm, the abdomen, or the leg. An implanted port has two main parts:   Reservoir. The reservoir is round and will appear as a small, raised area under your skin. The reservoir is the part where a needle is inserted to give medicines or draw blood.   Catheter. The catheter is a thin, flexible tube that extends from the reservoir. The catheter is placed into a large vein. Medicine that is inserted into the reservoir goes into the catheter and then into the vein.  HOW WILL I CARE FOR MY INCISION SITE? Do not get the incision site wet. Bathe or shower as directed by your health care provider.  HOW IS MY PORT ACCESSED? Special steps must be taken to access the port:   Before the port is accessed, a numbing cream can be placed on the skin. This helps numb the skin over the port site.   Your health care provider uses a sterile technique to access the port.  Your health care provider must put on a mask and sterile gloves.  The skin over your port is cleaned carefully with an antiseptic and allowed to dry.  The port is gently pinched between sterile gloves, and a needle is inserted into the port.  Only "non-coring" port needles should be used to access the port. Once the port is accessed, a blood return should be checked. This helps  ensure that the port is in the vein and is not clogged.   If your port needs to remain accessed for a constant infusion, a clear (transparent) bandage will be placed over the needle site. The bandage and needle will need to be changed every week, or as directed by your health care provider.   Keep the bandage covering the needle clean and dry. Do not get it wet. Follow your health care provider's instructions on how to take a shower or bath while the port is accessed.   If your port does not need to stay accessed, no bandage is needed over the port.  WHAT IS FLUSHING? Flushing helps keep the port from getting clogged. Follow your health care provider's instructions on how and when to flush the port. Ports are usually flushed with saline solution or a medicine called heparin. The need for flushing will depend on how the port is used.   If the port is used for intermittent medicines or blood draws, the port will need to be flushed:   After medicines have been given.   After blood has been drawn.   As part of routine maintenance.   If a constant infusion is running, the port may not need to be flushed.  HOW LONG WILL MY PORT STAY IMPLANTED? The port can stay in for as long as your health care   provider thinks it is needed. When it is time for the port to come out, surgery will be done to remove it. The procedure is similar to the one performed when the port was put in.  WHEN SHOULD I SEEK IMMEDIATE MEDICAL CARE? When you have an implanted port, you should seek immediate medical care if:   You notice a bad smell coming from the incision site.   You have swelling, redness, or drainage at the incision site.   You have more swelling or pain at the port site or the surrounding area.   You have a fever that is not controlled with medicine.   This information is not intended to replace advice given to you by your health care provider. Make sure you discuss any questions you have with  your health care provider.   Document Released: 08/20/2005 Document Revised: 06/10/2013 Document Reviewed: 04/27/2013 Elsevier Interactive Patient Education 2016 [employer]  Dehydration, Adult Dehydration is a condition in which you do not have enough fluid or water in your body. It happens when you take in less fluid than you lose. Vital organs such as the kidneys, brain, and heart cannot function without a proper amount of fluids. Any loss of fluids from the body can cause dehydration.  Dehydration can range from mild to severe. This condition should be treated right away to help prevent it from becoming severe. CAUSES  This condition may be caused by:  Vomiting.  Diarrhea.  Excessive sweating, such as when exercising in hot or humid weather.  Not drinking enough fluid during strenuous exercise or during an illness.  Excessive urine output.  Fever.  Certain medicines. RISK FACTORS This condition is more likely to develop in:  People who are taking certain medicines that cause the body to lose excess fluid (diuretics).   People who have a chronic illness, such as diabetes, that may increase urination.  Older adults.   People who live at high altitudes.   People who participate in endurance sports.  SYMPTOMS  Mild Dehydration  Thirst.  Dry lips.  Slightly dry mouth.  Dry, warm skin. Moderate Dehydration  Very dry mouth.   Muscle cramps.   Dark urine and decreased urine production.   Decreased tear production.   Headache.   Light-headedness, especially when you stand up from a sitting position.  Severe Dehydration  Changes in skin.   Cold and clammy skin.   Skin does not spring back quickly when lightly pinched and released.   Changes in body fluids.   Extreme thirst.   No tears.   Not able to sweat when body temperature is high, such as in hot weather.   Minimal urine production.   Changes in vital signs.   Rapid,  weak pulse (more than 100 beats per minute when you are sitting still).   Rapid breathing.   Low blood pressure.   Other changes.   Sunken eyes.   Cold hands and feet.   Confusion.  Lethargy and difficulty being awakened.  Fainting (syncope).   Short-term weight loss.   Unconsciousness. DIAGNOSIS  This condition may be diagnosed based on your symptoms. You may also have tests to determine how severe your dehydration is. These tests may include:   Urine tests.   Blood tests.  TREATMENT  Treatment for this condition depends on the severity. Mild or moderate dehydration can often be treated at home. Treatment should be started right away. Do not wait until dehydration becomes severe. Severe dehydration needs to   be treated at the hospital. Treatment for Mild Dehydration  Drinking plenty of water to replace the fluid you have lost.   Replacing minerals in your blood (electrolytes) that you may have lost.  Treatment for Moderate Dehydration  Consuming oral rehydration solution (ORS). Treatment for Severe Dehydration  Receiving fluid through an IV tube.   Receiving electrolyte solution through a feeding tube that is passed through your nose and into your stomach (nasogastric tube or NG tube).  Correcting any abnormalities in electrolytes. HOME CARE INSTRUCTIONS   Drink enough fluid to keep your urine clear or pale yellow.   Drink water or fluid slowly by taking small sips. You can also try sucking on ice cubes.  Have food or beverages that contain electrolytes. Examples include bananas and sports drinks.  Take over-the-counter and prescription medicines only as told by your health care provider.   Prepare ORS according to the manufacturer's instructions. Take sips of ORS every 5 minutes until your urine returns to normal.  If you have vomiting or diarrhea, continue to try to drink water, ORS, or both.   If you have diarrhea, avoid:   Beverages  that contain caffeine.   Fruit juice.   Milk.   Carbonated soft drinks.  Do not take salt tablets. This can lead to the condition of having too much sodium in your body (hypernatremia).  SEEK MEDICAL CARE IF:  You cannot eat or drink without vomiting.  You have had moderate diarrhea during a period of more than 24 hours.  You have a fever. SEEK IMMEDIATE MEDICAL CARE IF:   You have extreme thirst.  You have severe diarrhea.  You have not urinated in 6-8 hours, or you have urinated only a small amount of very dark urine.  You have shriveled skin.  You are dizzy, confused, or both.   This information is not intended to replace advice given to you by your health care provider. Make sure you discuss any questions you have with your health care provider.   Document Released: 08/20/2005 Document Revised: 05/11/2015 Document Reviewed: 01/05/2015 Elsevier Interactive Patient Education 2016 [employer] Inc.  

## 2016-01-03 ENCOUNTER — Telehealth: Payer: Self-pay | Admitting: Hematology and Oncology

## 2016-01-03 ENCOUNTER — Ambulatory Visit (HOSPITAL_BASED_OUTPATIENT_CLINIC_OR_DEPARTMENT_OTHER): Payer: Federal, State, Local not specified - PPO

## 2016-01-03 ENCOUNTER — Ambulatory Visit (HOSPITAL_BASED_OUTPATIENT_CLINIC_OR_DEPARTMENT_OTHER): Payer: Federal, State, Local not specified - PPO | Admitting: Hematology and Oncology

## 2016-01-03 ENCOUNTER — Other Ambulatory Visit (HOSPITAL_BASED_OUTPATIENT_CLINIC_OR_DEPARTMENT_OTHER): Payer: Federal, State, Local not specified - PPO

## 2016-01-03 ENCOUNTER — Encounter: Payer: Self-pay | Admitting: *Deleted

## 2016-01-03 ENCOUNTER — Encounter: Payer: Self-pay | Admitting: Hematology and Oncology

## 2016-01-03 VITALS — BP 138/87 | HR 105 | Temp 98.6°F | Resp 18 | Ht 65.0 in | Wt 248.6 lb

## 2016-01-03 DIAGNOSIS — Z5111 Encounter for antineoplastic chemotherapy: Secondary | ICD-10-CM | POA: Diagnosis not present

## 2016-01-03 DIAGNOSIS — R0789 Other chest pain: Secondary | ICD-10-CM | POA: Diagnosis not present

## 2016-01-03 DIAGNOSIS — C50412 Malignant neoplasm of upper-outer quadrant of left female breast: Secondary | ICD-10-CM

## 2016-01-03 DIAGNOSIS — G629 Polyneuropathy, unspecified: Secondary | ICD-10-CM | POA: Diagnosis not present

## 2016-01-03 DIAGNOSIS — D649 Anemia, unspecified: Secondary | ICD-10-CM

## 2016-01-03 DIAGNOSIS — R112 Nausea with vomiting, unspecified: Secondary | ICD-10-CM

## 2016-01-03 DIAGNOSIS — G620 Drug-induced polyneuropathy: Secondary | ICD-10-CM

## 2016-01-03 DIAGNOSIS — E860 Dehydration: Secondary | ICD-10-CM

## 2016-01-03 DIAGNOSIS — T451X5A Adverse effect of antineoplastic and immunosuppressive drugs, initial encounter: Secondary | ICD-10-CM

## 2016-01-03 DIAGNOSIS — R110 Nausea: Secondary | ICD-10-CM

## 2016-01-03 LAB — CBC WITH DIFFERENTIAL/PLATELET
BASO%: 0.7 % (ref 0.0–2.0)
Basophils Absolute: 0 10*3/uL (ref 0.0–0.1)
EOS%: 2.9 % (ref 0.0–7.0)
Eosinophils Absolute: 0.1 10*3/uL (ref 0.0–0.5)
HEMATOCRIT: 37.4 % (ref 34.8–46.6)
HGB: 12.3 g/dL (ref 11.6–15.9)
LYMPH#: 1.4 10*3/uL (ref 0.9–3.3)
LYMPH%: 33.3 % (ref 14.0–49.7)
MCH: 29.5 pg (ref 25.1–34.0)
MCHC: 32.7 g/dL (ref 31.5–36.0)
MCV: 90.1 fL (ref 79.5–101.0)
MONO#: 0.2 10*3/uL (ref 0.1–0.9)
MONO%: 5 % (ref 0.0–14.0)
NEUT%: 58.1 % (ref 38.4–76.8)
NEUTROS ABS: 2.5 10*3/uL (ref 1.5–6.5)
PLATELETS: 352 10*3/uL (ref 145–400)
RBC: 4.16 10*6/uL (ref 3.70–5.45)
RDW: 19.6 % — ABNORMAL HIGH (ref 11.2–14.5)
WBC: 4.3 10*3/uL (ref 3.9–10.3)

## 2016-01-03 LAB — COMPREHENSIVE METABOLIC PANEL
ALT: 58 U/L — AB (ref 0–55)
AST: 36 U/L — AB (ref 5–34)
Albumin: 3.6 g/dL (ref 3.5–5.0)
Alkaline Phosphatase: 60 U/L (ref 40–150)
Anion Gap: 7 mEq/L (ref 3–11)
BILIRUBIN TOTAL: 0.46 mg/dL (ref 0.20–1.20)
BUN: 11.3 mg/dL (ref 7.0–26.0)
CHLORIDE: 107 meq/L (ref 98–109)
CO2: 26 meq/L (ref 22–29)
CREATININE: 0.9 mg/dL (ref 0.6–1.1)
Calcium: 10.2 mg/dL (ref 8.4–10.4)
EGFR: >90 mL/min/{1.73_m2} (ref >90)
Glucose: 151 mg/dl — ABNORMAL HIGH (ref 70–140)
Potassium: 3.6 mEq/L (ref 3.5–5.1)
Sodium: 140 mEq/L (ref 136–145)
TOTAL PROTEIN: 7.2 g/dL (ref 6.4–8.3)

## 2016-01-03 MED ORDER — FAMOTIDINE IN NACL 20-0.9 MG/50ML-% IV SOLN
INTRAVENOUS | Status: AC
  Filled 2016-01-03: qty 50

## 2016-01-03 MED ORDER — SODIUM CHLORIDE 0.9 % IJ SOLN
10.0000 mL | INTRAMUSCULAR | Status: DC | PRN
  Administered 2016-01-03: 10 mL
  Filled 2016-01-03: qty 10

## 2016-01-03 MED ORDER — HEPARIN SOD (PORK) LOCK FLUSH 100 UNIT/ML IV SOLN
500.0000 [IU] | Freq: Once | INTRAVENOUS | Status: AC | PRN
  Administered 2016-01-03: 500 [IU]
  Filled 2016-01-03: qty 5

## 2016-01-03 MED ORDER — SODIUM CHLORIDE 0.9 % IV SOLN
50.0000 mg/m2 | Freq: Once | INTRAVENOUS | Status: AC
  Administered 2016-01-03: 114 mg via INTRAVENOUS
  Filled 2016-01-03: qty 19

## 2016-01-03 MED ORDER — SODIUM CHLORIDE 0.9 % IV SOLN
Freq: Once | INTRAVENOUS | Status: AC
  Administered 2016-01-03: 13:00:00 via INTRAVENOUS
  Filled 2016-01-03: qty 1

## 2016-01-03 MED ORDER — PALONOSETRON HCL INJECTION 0.25 MG/5ML
INTRAVENOUS | Status: AC
  Filled 2016-01-03: qty 5

## 2016-01-03 MED ORDER — DIPHENHYDRAMINE HCL 50 MG/ML IJ SOLN
25.0000 mg | Freq: Once | INTRAMUSCULAR | Status: AC
  Administered 2016-01-03: 25 mg via INTRAVENOUS

## 2016-01-03 MED ORDER — DIPHENHYDRAMINE HCL 50 MG/ML IJ SOLN
INTRAMUSCULAR | Status: AC
  Filled 2016-01-03: qty 1

## 2016-01-03 MED ORDER — FAMOTIDINE IN NACL 20-0.9 MG/50ML-% IV SOLN
20.0000 mg | Freq: Once | INTRAVENOUS | Status: AC
  Administered 2016-01-03: 20 mg via INTRAVENOUS

## 2016-01-03 MED ORDER — SODIUM CHLORIDE 0.9 % IV SOLN
Freq: Once | INTRAVENOUS | Status: AC
  Administered 2016-01-03: 12:00:00 via INTRAVENOUS

## 2016-01-03 MED ORDER — PALONOSETRON HCL INJECTION 0.25 MG/5ML
0.2500 mg | Freq: Once | INTRAVENOUS | Status: AC
  Administered 2016-01-03: 0.25 mg via INTRAVENOUS

## 2016-01-03 NOTE — Telephone Encounter (Signed)
appt made and avs printed °

## 2016-01-03 NOTE — Assessment & Plan Note (Signed)
Left breast biopsy 08/17/2015:1:30 position: Invasive ductal carcinoma, grade 3, ER 0%, PR 0%, HER-2 negative ratio 1.48, Ki-67 90%, 3.1 cm tumor, axilla negative, T2 N0 stage II a clinical stage  Treatment plan: 1. Neoadjuvant chemotherapy with dose dense Adriamycin and Cytoxan 4 followed by Taxol and carboplatin weekly 12 2. Followed by surgery 3. Followed by adjuvant radiation Breast biopsies x 2: 09/28/2015 : PASH -------------------------------------------------------------------------------------------------------------------------------------------------- Current treatment: Completed Cycle 4 dose dense Adriamycin and Cytoxan on 10/31/15; completed cycle 3 Taxol and carboplatin; today is cycle 6 Taxol alone weekly  Echocardiogram 09/13/2015: EF 55-60% Labs were reviewed.  Normocytic anemia: work up revealed iron deficiency: Received IV iron treatment with chemotherapy Chemotoxicities: 1. Severe nausea and vomiting: We will administer IV Zofran today again because of nausea issues. 2. Hair loss 3. Conjunctivitis versus corneal abrasion: Resolved with steroid eyedrops 4. Insomnia: I encouraged her to take 2 Ativan once at bedtime. 5. Fever up to 100F: Resolved.  7. Dehydration: received IV fluids with normal saline along with Zofran. 8. Diarrhea: Improved with Imodium 9. Skin dryness leading to itching: I instructed her to use Vaseline on Aquaphor 10. Diarrhea alternating with constipation 11. Bilateral chest wall pains: No PE. Probably musculoskeletal in nature  Anemia:Hemoglobin electrophoresis 10/31/2015: Normal . Her daughter and her mother are all anemic. Her hemoglobin is 11.1 mg Hospitalization for intractable nausea vomiting diarrhea of 12/08/2015- 12/12/2015  Monitoring very closely for chemotherapy toxicities. Return to clinic with cycle 7 of Taxol

## 2016-01-03 NOTE — Progress Notes (Signed)
Patient Care Team: [name], MD as PCP - General (Internal Medicine)  DIAGNOSIS: No matching staging information was found for the patient.  SUMMARY OF ONCOLOGIC HISTORY:   Breast cancer of upper-outer quadrant of left female breast ([facility])   08/17/2015 Initial Diagnosis left breast biopsy 1:30 position: Invasive ductal carcinoma, grade 3, ER 0%, PR 0%, HER-2 negative ratio 1.48, Ki-67 90%, 3.1 cm tumor, axilla negative, T2 N0 stage II a clinical stage   09/19/2015 -  Neo-Adjuvant Chemotherapy Neoadjuvant chemotherapy with dose dense Adriamycin and Cytoxan 4 followed by Taxol and carboplatin weekly 12   09/28/2015 Procedure  Left breast biopsy anterior third left upper outer quadrant: PASH;  upper inner quadrant: PASH    CHIEF COMPLIANT: Fatigue, loss of taste, generalized weakness  INTERVAL HISTORY: Cindy Bradshaw is a 47 over the above-mentioned history of left breast cancer currently on neoadjuvant chemotherapy. Today is Taxol treatment and we had to reduce the dosage of chemotherapy because of tolerance issues. He she had abdominal chest pain and CT of the chest was negative for pulmonary embolism. It appears the pain is much better. She is continuing to work. She complains of numbness in the right arm. There is no numbness in her feet.  REVIEW OF SYSTEMS:   Constitutional: Denies fevers, chills or abnormal weight loss Eyes: Denies blurriness of vision Ears, nose, mouth, throat, and face: Denies mucositis or sore throat Respiratory: Denies cough, dyspnea or wheezes Cardiovascular: Denies palpitation, chest discomfort Gastrointestinal:  Intermittent nausea and abdominal pain Skin: Denies abnormal skin rashes Lymphatics: Denies new lymphadenopathy or easy bruising Neurological right arm numbness Behavioral/Psych: Mood is stable, no new changes  Extremities: No lower extremity edema  All other systems were reviewed with the patient and are negative.  I have  reviewed the past medical history, past surgical history, social history and family history with the patient and they are unchanged from previous note.  ALLERGIES:  is allergic to tape; dilaudid; ivp dye; percocet; and toradol.  MEDICATIONS:  Current Outpatient Prescriptions  Medication Sig Dispense Refill  . diphenoxylate-atropine (LOMOTIL) 2.5-0.025 MG tablet Take 2 tablets by mouth 4 (four) times daily as needed for diarrhea or loose stools. (Patient not taking: Reported on 12/13/2015) 30 tablet 0  . Hydrocodone-Acetaminophen 5-300 MG TABS Take 1 tablet by mouth every 8 (eight) hours as needed (Chest and rib pain). 60 each 0  . lidocaine-prilocaine (EMLA) cream Apply to affected area as directed.  3  . LORazepam (ATIVAN) 0.5 MG tablet Take 1 tablet (0.5 mg total) by mouth at bedtime. 30 tablet 0  . ondansetron (ZOFRAN) 8 MG tablet Take 1 tablet (8 mg total) by mouth every 8 (eight) hours as needed for nausea or vomiting. 30 tablet 1  . predniSONE (DELTASONE) 50 MG tablet Take 1 tablet (50 mg total) by mouth as directed. Take 32m at midnight, 6:00am, and 12:00pm. 3 tablet 0  . PROAIR HFA 108 (90 BASE) MCG/ACT inhaler Inhale 2 puffs into the lungs every 4 (four) hours as needed. Reported on 12/13/2015  3  . prochlorperazine (COMPAZINE) 10 MG tablet Take 1 tablet (10 mg total) by mouth every 6 (six) hours as needed for nausea or vomiting. 30 tablet 0  . traMADol (ULTRAM) 50 MG tablet Take 1 tablet (50 mg total) by mouth every 6 (six) hours as needed for moderate pain or severe pain. 20 tablet 0  . triamcinolone cream (KENALOG) 0.1 % Apply 1 application topically 2 (two) times daily. 160 g 1  No current facility-administered medications for this visit.   Facility-Administered Medications Ordered in Other Visits  Medication Dose Route Frequency Provider Last Rate Last Dose  . 0.9 %  sodium chloride infusion   Intravenous Once Laurie Panda, NP        PHYSICAL EXAMINATION: ECOG PERFORMANCE  STATUS: 2 - Symptomatic, <50% confined to bed  Filed Vitals:   01/03/16 1114  BP: 138/87  Pulse: 105  Temp: 98.6 F (37 C)  Resp: 18   Filed Weights   01/03/16 1114  Weight: 248 lb 9.6 oz (112.764 kg)    GENERAL:alert, no distress and comfortable SKIN: skin color, texture, turgor are normal, no rashes or significant lesions EYES: normal, Conjunctiva are pink and non-injected, sclera clear OROPHARYNX:no exudate, no erythema and lips, buccal mucosa, and tongue normal  NECK: supple, thyroid normal size, non-tender, without nodularity LYMPH:  no palpable lymphadenopathy in the cervical, axillary or inguinal LUNGS: clear to auscultation and percussion with normal breathing effort HEART: regular rate & rhythm and no murmurs and no lower extremity edema ABDOMEN:abdomen soft, non-tender and normal bowel sounds MUSCULOSKELETAL:no cyanosis of digits and no clubbing  NEURO: alert & oriented x 3 with fluent speech, left arm numbness grade 1 neuropathy EXTREMITIES: No lower extremity edema  LABORATORY DATA:  I have reviewed the data as listed   Chemistry      Component Value Date/Time   NA 140 12/27/2015 1301   NA 141 12/12/2015 0332   K 3.5 12/27/2015 1301   K 3.7 12/12/2015 0332   CL 110 12/12/2015 0332   CO2 26 12/27/2015 1301   CO2 24 12/12/2015 0332   BUN 11.1 12/27/2015 1301   BUN 6 12/12/2015 0332   CREATININE 0.7 12/27/2015 1301   CREATININE 0.61 12/12/2015 0332      Component Value Date/Time   CALCIUM 9.9 12/27/2015 1301   CALCIUM 9.6 12/12/2015 0332   ALKPHOS 66 12/27/2015 1301   ALKPHOS 53 12/09/2015 0452   AST 37* 12/27/2015 1301   AST 178* 12/09/2015 0452   ALT 70* 12/27/2015 1301   ALT 142* 12/09/2015 0452   BILITOT <0.30 12/27/2015 1301   BILITOT 1.0 12/09/2015 0452       Lab Results  Component Value Date   WBC 4.8 12/27/2015   HGB 11.8 12/27/2015   HCT 36.5 12/27/2015   MCV 90.1 12/27/2015   PLT 450* 12/27/2015   NEUTROABS 3.0 12/27/2015      ASSESSMENT & PLAN:  Breast cancer of upper-outer quadrant of left female breast ([facility]) Left breast biopsy 08/17/2015:1:30 position: Invasive ductal carcinoma, grade 3, ER 0%, PR 0%, HER-2 negative ratio 1.48, Ki-67 90%, 3.1 cm tumor, axilla negative, T2 N0 stage II a clinical stage  Treatment plan: 1. Neoadjuvant chemotherapy with dose dense Adriamycin and Cytoxan 4 followed by Taxol and carboplatin weekly 12 2. Followed by surgery 3. Followed by adjuvant radiation Breast biopsies x 2: 09/28/2015 : PASH -------------------------------------------------------------------------------------------------------------------------------------------------- Current treatment: Completed Cycle 4 dose dense Adriamycin and Cytoxan on 10/31/15; completed cycle 3 Taxol and carboplatin; today is cycle 6 Taxol alone weekly  Echocardiogram 09/13/2015: EF 55-60% Labs were reviewed.  Normocytic anemia: work up revealed iron deficiency: Received IV iron treatment with chemotherapy Chemotoxicities: 1. Severe nausea and vomiting: We will administer IV Zofran today again because of nausea issues. 2. Hair loss 3. Conjunctivitis versus corneal abrasion: Resolved with steroid eyedrops 4. Insomnia: I encouraged her to take 2 Ativan once at bedtime. 5. Fever up to 100F: Resolved.  7. Dehydration:  receives IV fluids with normal saline along with Zofran on Thursdays weekly. 8. Diarrhea: Improved with Imodium 9. Skin dryness leading to itching: I instructed her to use Vaseline on Aquaphor 10. Diarrhea alternating with constipation:  11. Bilateral chest wall pains: No PE. Probably musculoskeletal in nature 12. Neuropathy grade 1 especially in the right hand: we'll monitor this closely  Anemia:Hemoglobin electrophoresis 10/31/2015: Normal . Her daughter and her mother are all anemic. Her hemoglobin is 11.8 mg Hospitalization for intractable nausea vomiting diarrhea of 12/08/2015- 12/12/2015  Monitoring  very closely for chemotherapy toxicities. Return to clinic with cycle 7 of Taxol    No orders of the defined types were placed in this encounter.   The patient has a good understanding of the overall plan. she agrees with it. she will call with any problems that may develop before the next visit here.   Rulon Eisenmenger, MD 01/03/2016

## 2016-01-03 NOTE — Patient Instructions (Signed)
[facility] Discharge Instructions for Patients Receiving Chemotherapy  Today you received the following chemotherapy agents Taxol   To help prevent nausea and vomiting after your treatment, we encourage you to take your nausea medication as directed.   If you develop nausea and vomiting that is not controlled by your nausea medication, call the clinic.   BELOW ARE SYMPTOMS THAT SHOULD BE REPORTED IMMEDIATELY:  *FEVER GREATER THAN 100.5 F  *CHILLS WITH OR WITHOUT FEVER  NAUSEA AND VOMITING THAT IS NOT CONTROLLED WITH YOUR NAUSEA MEDICATION  *UNUSUAL SHORTNESS OF BREATH  *UNUSUAL BRUISING OR BLEEDING  TENDERNESS IN MOUTH AND THROAT WITH OR WITHOUT PRESENCE OF ULCERS  *URINARY PROBLEMS  *BOWEL PROBLEMS  UNUSUAL RASH Items with * indicate a potential emergency and should be followed up as soon as possible.  Feel free to call the clinic you have any questions or concerns. The clinic phone number is [phone].  Please show the CHEMO ALERT CARD at check-in to the Emergency Department and triage nurse.   

## 2016-01-04 ENCOUNTER — Ambulatory Visit (HOSPITAL_BASED_OUTPATIENT_CLINIC_OR_DEPARTMENT_OTHER): Payer: Federal, State, Local not specified - PPO | Admitting: Nurse Practitioner

## 2016-01-04 ENCOUNTER — Other Ambulatory Visit: Payer: Self-pay | Admitting: *Deleted

## 2016-01-04 ENCOUNTER — Other Ambulatory Visit (HOSPITAL_BASED_OUTPATIENT_CLINIC_OR_DEPARTMENT_OTHER): Payer: Federal, State, Local not specified - PPO

## 2016-01-04 ENCOUNTER — Telehealth: Payer: Self-pay | Admitting: *Deleted

## 2016-01-04 ENCOUNTER — Other Ambulatory Visit: Payer: Self-pay | Admitting: Nurse Practitioner

## 2016-01-04 ENCOUNTER — Ambulatory Visit (HOSPITAL_BASED_OUTPATIENT_CLINIC_OR_DEPARTMENT_OTHER): Payer: Federal, State, Local not specified - PPO

## 2016-01-04 ENCOUNTER — Telehealth: Payer: Self-pay | Admitting: Hematology and Oncology

## 2016-01-04 ENCOUNTER — Encounter: Payer: Self-pay | Admitting: Nurse Practitioner

## 2016-01-04 VITALS — BP 125/93 | HR 88 | Temp 99.3°F | Resp 18

## 2016-01-04 DIAGNOSIS — R112 Nausea with vomiting, unspecified: Secondary | ICD-10-CM | POA: Diagnosis not present

## 2016-01-04 DIAGNOSIS — R319 Hematuria, unspecified: Secondary | ICD-10-CM | POA: Diagnosis not present

## 2016-01-04 DIAGNOSIS — D649 Anemia, unspecified: Secondary | ICD-10-CM

## 2016-01-04 DIAGNOSIS — C50412 Malignant neoplasm of upper-outer quadrant of left female breast: Secondary | ICD-10-CM

## 2016-01-04 DIAGNOSIS — E860 Dehydration: Secondary | ICD-10-CM

## 2016-01-04 DIAGNOSIS — R1084 Generalized abdominal pain: Secondary | ICD-10-CM

## 2016-01-04 LAB — URINALYSIS, MICROSCOPIC - CHCC
Bilirubin (Urine): NEGATIVE
GLUCOSE UR CHCC: NEGATIVE mg/dL
KETONES: NEGATIVE mg/dL
Leukocyte Esterase: NEGATIVE
Nitrite: NEGATIVE
PH: 6 (ref 4.6–8.0)
SPECIFIC GRAVITY, URINE: 1.02 (ref 1.003–1.035)
Urobilinogen, UR: 0.2 mg/dL (ref 0.2–1)

## 2016-01-04 MED ORDER — LORAZEPAM 2 MG/ML IJ SOLN
INTRAMUSCULAR | Status: AC
  Filled 2016-01-04: qty 1

## 2016-01-04 MED ORDER — ALTEPLASE 2 MG IJ SOLR
2.0000 mg | Freq: Once | INTRAMUSCULAR | Status: DC | PRN
  Filled 2016-01-04: qty 2

## 2016-01-04 MED ORDER — PROMETHAZINE HCL 25 MG/ML IJ SOLN: 25.0000 mg | Freq: Once | INTRAMUSCULAR | Status: DC

## 2016-01-04 MED ORDER — MORPHINE SULFATE 4 MG/ML IJ SOLN
2.0000 mg | Freq: Once | INTRAMUSCULAR | Status: AC
  Administered 2016-01-04: 2 mg via INTRAVENOUS
  Filled 2016-01-04: qty 1

## 2016-01-04 MED ORDER — SODIUM CHLORIDE 0.9 % IJ SOLN
10.0000 mL | INTRAMUSCULAR | Status: DC | PRN
  Administered 2016-01-04: 10 mL via INTRAVENOUS
  Filled 2016-01-04: qty 10

## 2016-01-04 MED ORDER — SODIUM CHLORIDE 0.9 % IV SOLN
Freq: Once | INTRAVENOUS | Status: AC
  Administered 2016-01-04: 14:00:00 via INTRAVENOUS
  Filled 2016-01-04: qty 4

## 2016-01-04 MED ORDER — CIPROFLOXACIN HCL 500 MG PO TABS: 500.0000 mg | ORAL_TABLET | Freq: Two times a day (BID) | ORAL | Status: DC

## 2016-01-04 MED ORDER — LORAZEPAM 2 MG/ML IJ SOLN
0.5000 mg | Freq: Once | INTRAMUSCULAR | Status: AC
  Administered 2016-01-04: 0.5 mg via INTRAVENOUS

## 2016-01-04 MED ORDER — SODIUM CHLORIDE 0.45 % IV SOLN
Freq: Once | INTRAVENOUS | Status: DC
  Filled 2016-01-04: qty 1000

## 2016-01-04 MED ORDER — HEPARIN SOD (PORK) LOCK FLUSH 100 UNIT/ML IV SOLN
250.0000 [IU] | Freq: Once | INTRAVENOUS | Status: DC | PRN
  Filled 2016-01-04: qty 5

## 2016-01-04 MED ORDER — MORPHINE SULFATE (PF) 4 MG/ML IV SOLN
INTRAVENOUS | Status: AC
  Filled 2016-01-04: qty 1

## 2016-01-04 MED ORDER — SODIUM CHLORIDE 0.9 % IV SOLN
Freq: Once | INTRAVENOUS | Status: AC
  Administered 2016-01-04: 14:00:00 via INTRAVENOUS

## 2016-01-04 MED ORDER — HEPARIN SOD (PORK) LOCK FLUSH 100 UNIT/ML IV SOLN
500.0000 [IU] | Freq: Once | INTRAVENOUS | Status: AC | PRN
  Administered 2016-01-04: 500 [IU] via INTRAVENOUS
  Filled 2016-01-04: qty 5

## 2016-01-04 NOTE — Progress Notes (Signed)
SYMPTOM MANAGEMENT CLINIC    Chief Complaint: Nausea  HPI:  Cindy Bradshaw 45 y.o. female diagnosed with breast cancer.  Currently undergoing Taxol/carboplatin chemotherapy regimen.   Pt suffers with chronic nausea and vomiting- most likely secondary to her chemo txs.  She was scheduled to come in tomorrow 5/4 for IV fluids; but presented today to the cancer center with c/o increased N/V and some mild general abd pain.   Patient has taken nausea medications at home with only minimal effectiveness.  Patient was actively dry heaving while in the cancer Center infusion area.     Breast cancer of upper-outer quadrant of left female breast ([place])   08/17/2015 Initial Diagnosis left breast biopsy 1:30 position: Invasive ductal carcinoma, grade 3, ER 0%, PR 0%, HER-2 negative ratio 1.48, Ki-67 90%, 3.1 cm tumor, axilla negative, T2 N0 stage II a clinical stage   09/19/2015 -  Neo-Adjuvant Chemotherapy Neoadjuvant chemotherapy with dose dense Adriamycin and Cytoxan 4 followed by Taxol and carboplatin weekly 12   09/28/2015 Procedure  Left breast biopsy anterior third left upper outer quadrant: PASH;  upper inner quadrant: PASH    Review of Systems  Constitutional: Positive for fever and malaise/fatigue. Negative for chills.  Gastrointestinal: Positive for nausea, vomiting and abdominal pain.  Genitourinary: Positive for hematuria.  All other systems reviewed and are negative.   Past Medical History  Diagnosis Date  . Cancer ([place])   . Migraine   . Asthma   . Breast cancer ([place])   . Arthritis   . Back disorder     degenerative disk disease  . Pap smear for cervical cancer screening 08/12/15  . Mammogram abnormal 08/24/15    first  . GERD (gastroesophageal reflux disease)     Past Surgical History  Procedure Laterality Date  . Cholecystectomy    . Cesarean section    . Portacath placement [name] 09/12/2015    Procedure: INSERTION PORT-A-CATH;  Surgeon: Excell Seltzer, MD;   Location: [facility];  Service: General;  Laterality: [name];    has Breast cancer of upper-outer quadrant of left female breast ([place]); Normocytic normochromic anemia; Family history of breast cancer in female; Dehydration; Genetic testing; Chemotherapy-induced peripheral neuropathy ([place]); Asthma; Abdominal pain; Adnexal cyst: Right per CT 12/09/2015; and Nausea with vomiting on her problem list.    is allergic to tape; dilaudid; ivp dye; percocet; and toradol.    Medication List       This list is accurate as of: 01/04/16  4:55 PM.  Always use your most recent med list.               ciprofloxacin 500 MG tablet  Commonly known as:  CIPRO  Take 1 tablet (500 mg total) by mouth 2 (two) times daily.     diphenoxylate-atropine 2.5-0.025 MG tablet  Commonly known as:  LOMOTIL  Take 2 tablets by mouth 4 (four) times daily as needed for diarrhea or loose stools.     Hydrocodone-Acetaminophen 5-300 MG Tabs  Take 1 tablet by mouth every 8 (eight) hours as needed (Chest and rib pain).     lidocaine-prilocaine cream  Commonly known as:  EMLA  Apply to affected area as directed.     LORazepam 0.5 MG tablet  Commonly known as:  ATIVAN  Take 1 tablet (0.5 mg total) by mouth at bedtime.     ondansetron 8 MG tablet  Commonly known as:  ZOFRAN  Take 1 tablet (8 mg total) by mouth every 8 (eight)  hours as needed for nausea or vomiting.     predniSONE 50 MG tablet  Commonly known as:  DELTASONE  Take 1 tablet (50 mg total) by mouth as directed. Take 74m at midnight, 6:00am, and 12:00pm.     PROAIR HFA 108 (90 Base) MCG/ACT inhaler  Generic drug:  albuterol  Inhale 2 puffs into the lungs every 4 (four) hours as needed. Reported on 12/13/2015     prochlorperazine 10 MG tablet  Commonly known as:  COMPAZINE  Take 1 tablet (10 mg total) by mouth every 6 (six) hours as needed for nausea or vomiting.     traMADol 50 MG tablet  Commonly known as:  ULTRAM  Take 1 tablet (50 mg total) by mouth every  6 (six) hours as needed for moderate pain or severe pain.     triamcinolone cream 0.1 %  Commonly known as:  KENALOG  Apply 1 application topically 2 (two) times daily.         PHYSICAL EXAMINATION  Oncology Vitals 01/04/2016 01/04/2016  Height - -  Weight - -  Weight (lbs) - -  BMI (kg/m2) - -  Temp 99.3 99.4  Pulse 88 87  Resp - 18  SpO2 99 100  BSA (m2) - -   BP Readings from Last 2 Encounters:  01/04/16 125/93  01/03/16 138/87    Physical Exam  Constitutional: She is oriented to person, place, and time and well-developed, well-nourished, and in no distress.  HENT:  Head: Normocephalic and atraumatic.  Eyes: Conjunctivae and EOM are normal. Pupils are equal, round, and reactive to light. Right eye exhibits no discharge. Left eye exhibits no discharge. No scleral icterus.  Neck: Normal range of motion.  Pulmonary/Chest: Effort normal. No respiratory distress.  Musculoskeletal: Normal range of motion.  Neurological: She is alert and oriented to person, place, and time.  Psychiatric: Affect normal.  Nursing note and vitals reviewed.   LABORATORY DATA:. Appointment on 01/04/2016  Component Date Value Ref Range Status  . Glucose 01/04/2016 Negative  Negative mg/dL Final  . Bilirubin (Urine) 01/04/2016 Negative  Negative Final  . Ketones 01/04/2016 Negative  Negative mg/dL Final  . Specific Gravity, Urine 01/04/2016 1.020  1.003 - 1.035 Final  . Blood 01/04/2016 Moderate  Negative Final  . pH 01/04/2016 6.0  4.6 - 8.0 Final  . Protein 01/04/2016 < 30  Negative- <30 mg/dL Final  . Urobilinogen, UR 01/04/2016 0.2  0.2 - 1 mg/dL Final  . Nitrite 01/04/2016 Negative  Negative Final  . Leukocyte Esterase 01/04/2016 Negative  Negative Final  . RBC / HPF 01/04/2016 3-6  0 - 2 Final  . WBC, UA 01/04/2016 0-2  0 - 2 Final  . Bacteria, UA 01/04/2016 Many  Negative- Trace Final  . Epithelial Cells 01/04/2016 Many  Negative- Few Final  Appointment on 01/03/2016  Component Date  Value Ref Range Status  . WBC 01/03/2016 4.3  3.9 - 10.3 10e3/uL Final  . NEUT# 01/03/2016 2.5  1.5 - 6.5 10e3/uL Final  . HGB 01/03/2016 12.3  11.6 - 15.9 g/dL Final  . HCT 01/03/2016 37.4  34.8 - 46.6 % Final  . Platelets 01/03/2016 352  145 - 400 10e3/uL Final  . MCV 01/03/2016 90.1  79.5 - 101.0 fL Final  . MCH 01/03/2016 29.5  25.1 - 34.0 pg Final  . MCHC 01/03/2016 32.7  31.5 - 36.0 g/dL Final  . RBC 01/03/2016 4.16  3.70 - 5.45 10e6/uL Final  . RDW 01/03/2016 19.6* 11.2 -  14.5 % Final  . lymph# 01/03/2016 1.4  0.9 - 3.3 10e3/uL Final  . MONO# 01/03/2016 0.2  0.1 - 0.9 10e3/uL Final  . Eosinophils Absolute 01/03/2016 0.1  0.0 - 0.5 10e3/uL Final  . Basophils Absolute 01/03/2016 0.0  0.0 - 0.1 10e3/uL Final  . NEUT% 01/03/2016 58.1  38.4 - 76.8 % Final  . LYMPH% 01/03/2016 33.3  14.0 - 49.7 % Final  . MONO% 01/03/2016 5.0  0.0 - 14.0 % Final  . EOS% 01/03/2016 2.9  0.0 - 7.0 % Final  . BASO% 01/03/2016 0.7  0.0 - 2.0 % Final  . Sodium 01/03/2016 140  136 - 145 mEq/L Final  . Potassium 01/03/2016 3.6  3.5 - 5.1 mEq/L Final  . Chloride 01/03/2016 107  98 - 109 mEq/L Final  . CO2 01/03/2016 26  22 - 29 mEq/L Final  . Glucose 01/03/2016 151* 70 - 140 mg/dl Final   Glucose reference range is for nonfasting patients. Fasting glucose reference range is 70- 100.  Marland Kitchen BUN 01/03/2016 11.3  7.0 - 26.0 mg/dL Final  . Creatinine 01/03/2016 0.9  0.6 - 1.1 mg/dL Final  . Total Bilirubin 01/03/2016 0.46  0.20 - 1.20 mg/dL Final  . Alkaline Phosphatase 01/03/2016 60  40 - 150 U/L Final  . AST 01/03/2016 36* 5 - 34 U/L Final  . ALT 01/03/2016 58* 0 - 55 U/L Final  . Total Protein 01/03/2016 7.2  6.4 - 8.3 g/dL Final  . Albumin 01/03/2016 3.6  3.5 - 5.0 g/dL Final  . Calcium 01/03/2016 10.2  8.4 - 10.4 mg/dL Final  . Anion Gap 01/03/2016 7  3 - 11 mEq/L Final  . EGFR 01/03/2016 >90  >90 ml/min/1.73 m2 Final   eGFR is calculated using the CKD-EPI Creatinine Equation (2009)    RADIOGRAPHIC  STUDIES: No results found.  ASSESSMENT/PLAN:    Nausea with vomiting Pt suffers with chronic nausea and vomiting- most likely secondary to her chemo txs.  She was scheduled to come in tomorrow 5/4 for IV fluids; but presented today to the cancer center with c/o increased N/V and some mild general abd pain.   Patient has taken nausea medications at home with only minimal effectiveness.  Patient was actively dry heaving while in the cancer Center infusion area.  Patient was given both Zofran and Ativan IV for her nausea complaints.  Patient also received IV fluid rehydration as well.  The cancer Center today.  Patient will return tomorrow for additional IV fluid rehydration.  Dehydration Pt suffers with chronic nausea and vomiting- most likely secondary to her chemo txs.  She was scheduled to come in tomorrow 5/4 for IV fluids; but presented today to the cancer center with c/o increased N/V and some mild general abd pain.   Patient has taken nausea medications at home with only minimal effectiveness.  Patient was actively dry heaving while in the cancer Center infusion area.  Patient was given both Zofran and Ativan IV for her nausea complaints.  Patient also received IV fluid rehydration as well.  The cancer Center today.  Patient will return tomorrow for additional IV fluid rehydration.  Breast cancer of upper-outer quadrant of left female breast [facility]) Patient received cycle 6 of a Taxol/carboplatin chemotherapy just yesterday, 01/03/2016.  See further notes for details of today's visit.  Patient is scheduled to return tomorrow for IV fluid rehydration only.  She is scheduled to return for labs, visit, and chemotherapy on 01/10/2016.  Abdominal pain Patient reports some  chronic, generalized abdominal discomfort.  She also suffers with some chronic nausea as well.  She states that she has noticed an odor to her urine within the past few days.  She also states that her urine is much  more concentrated.  She has had a low-grade fever of 99.4 for the past 24 hours as well.  Urinalysis obtained today revealed moderate hematuria and many bacteria.  Patient has a questionable UTI; but will go ahead and prescribe Cipro antibiotic.  Since patient does have a low-grade fever.  Urine culture results pending.  Patient was advised to go directly to the emergency department for any worsening symptoms whatsoever.   Patient stated understanding of all instructions; and was in agreement with this plan of care. The patient knows to call the clinic with any problems, questions or concerns.   Total time spent with patient was 25 minutes;  with greater than 75 percent of that time spent in face to face counseling regarding patient's symptoms,  and coordination of care and follow up.  Disclaimer:This dictation was prepared with Dragon/digital dictation along with [employer]. Any transcriptional errors that result from this process are unintentional.  Drue Second, NP 01/04/2016

## 2016-01-04 NOTE — Telephone Encounter (Signed)
per pof to add lab-pt aware

## 2016-01-04 NOTE — Progress Notes (Signed)
Pt arrived to infusion room vomiting and dry heaves for aprox five to ten minutes.  PAC accessed, IVFs started.  Phenergan IV not available in pharmacy.  Called Selena Lesser, NP for ativan IV order.  Ativan and zofran given.  Selena Lesser assessed pt and also ordered Morphine for abd pain 6/10.   Nausea/ vomiting resolved w/ anti emetics and Pain down to 2/10 upon d/c.  Instructed pt to pick up Cipro and start tonight for possible UTI.  Take nausea meds at home as directed and keep appt for IVFs as scheduled tomorrow.  Pt verbalized understanding.

## 2016-01-04 NOTE — Assessment & Plan Note (Signed)
Patient reports some chronic, generalized abdominal discomfort.  She also suffers with some chronic nausea as well.  She states that she has noticed an odor to her urine within the past few days.  She also states that her urine is much more concentrated.  She has had a low-grade fever of 99.4 for the past 24 hours as well.  Urinalysis obtained today revealed moderate hematuria and many bacteria.  Patient has a questionable UTI; but will go ahead and prescribe Cipro antibiotic.  Since patient does have a low-grade fever.  Urine culture results pending.  Patient was advised to go directly to the emergency department for any worsening symptoms whatsoever.

## 2016-01-04 NOTE — Telephone Encounter (Signed)
"  I vomited all night last night, took lorazepam.  Today I used compazine.  No vomiting but nauseated.  My stomach hurts.  Afraid to eat or drink because I do not want to throw up.  I haven't had this happen since the carboplatin was discontinued.  I do not think I can wait until tomorrow for IVF's.  I also am having to pee more often.  My urine is dark but no odor.  I think I have a UTI."  Collaborated with infusion room to add IVF  Today at 1:30 pm.  P.O.F. Generated.

## 2016-01-04 NOTE — Assessment & Plan Note (Signed)
Pt suffers with chronic nausea and vomiting- most likely secondary to her chemo txs.  She was scheduled to come in tomorrow 5/4 for IV fluids; but presented today to the cancer center with c/o increased N/V and some mild general abd pain.   Patient has taken nausea medications at home with only minimal effectiveness.  Patient was actively dry heaving while in the cancer Center infusion area.  Patient was given both Zofran and Ativan IV for her nausea complaints.  Patient also received IV fluid rehydration as well.  The cancer Center today.  Patient will return tomorrow for additional IV fluid rehydration.

## 2016-01-04 NOTE — Patient Instructions (Addendum)

## 2016-01-04 NOTE — Assessment & Plan Note (Signed)
Patient received cycle 6 of a Taxol/carboplatin chemotherapy just yesterday, 01/03/2016.  See further notes for details of today's visit.  Patient is scheduled to return tomorrow for IV fluid rehydration only.  She is scheduled to return for labs, visit, and chemotherapy on 01/10/2016.

## 2016-01-05 ENCOUNTER — Ambulatory Visit: Payer: Self-pay

## 2016-01-05 LAB — URINE CULTURE: Organism ID, Bacteria: NO GROWTH

## 2016-01-06 ENCOUNTER — Ambulatory Visit (HOSPITAL_BASED_OUTPATIENT_CLINIC_OR_DEPARTMENT_OTHER): Payer: Federal, State, Local not specified - PPO

## 2016-01-06 VITALS — BP 137/90 | HR 88 | Temp 98.8°F

## 2016-01-06 DIAGNOSIS — E860 Dehydration: Secondary | ICD-10-CM | POA: Diagnosis not present

## 2016-01-06 DIAGNOSIS — C50412 Malignant neoplasm of upper-outer quadrant of left female breast: Secondary | ICD-10-CM

## 2016-01-06 DIAGNOSIS — D649 Anemia, unspecified: Secondary | ICD-10-CM

## 2016-01-06 MED ORDER — SODIUM CHLORIDE 0.9 % IV SOLN
Freq: Once | INTRAVENOUS | Status: AC
  Administered 2016-01-06: 12:00:00 via INTRAVENOUS

## 2016-01-06 MED ORDER — HEPARIN SOD (PORK) LOCK FLUSH 100 UNIT/ML IV SOLN
250.0000 [IU] | Freq: Once | INTRAVENOUS | Status: DC | PRN
  Filled 2016-01-06: qty 5

## 2016-01-06 MED ORDER — SODIUM CHLORIDE 0.9 % IJ SOLN
10.0000 mL | INTRAMUSCULAR | Status: DC | PRN
  Administered 2016-01-06: 10 mL via INTRAVENOUS
  Filled 2016-01-06: qty 10

## 2016-01-06 MED ORDER — SODIUM CHLORIDE 0.45 % IV SOLN
Freq: Once | INTRAVENOUS | Status: DC
  Filled 2016-01-06: qty 1000

## 2016-01-06 MED ORDER — PROMETHAZINE HCL 25 MG/ML IJ SOLN: 25.0000 mg | Freq: Once | INTRAMUSCULAR | Status: DC

## 2016-01-06 MED ORDER — HEPARIN SOD (PORK) LOCK FLUSH 100 UNIT/ML IV SOLN
500.0000 [IU] | Freq: Once | INTRAVENOUS | Status: AC | PRN
  Administered 2016-01-06: 500 [IU] via INTRAVENOUS
  Filled 2016-01-06: qty 5

## 2016-01-06 MED ORDER — ALTEPLASE 2 MG IJ SOLR
2.0000 mg | Freq: Once | INTRAMUSCULAR | Status: DC | PRN
  Filled 2016-01-06: qty 2

## 2016-01-06 NOTE — Patient Instructions (Signed)

## 2016-01-10 ENCOUNTER — Encounter: Payer: Self-pay | Admitting: *Deleted

## 2016-01-10 ENCOUNTER — Other Ambulatory Visit: Payer: Self-pay | Admitting: *Deleted

## 2016-01-10 ENCOUNTER — Ambulatory Visit: Payer: Federal, State, Local not specified - PPO

## 2016-01-10 ENCOUNTER — Other Ambulatory Visit (HOSPITAL_BASED_OUTPATIENT_CLINIC_OR_DEPARTMENT_OTHER): Payer: Federal, State, Local not specified - PPO

## 2016-01-10 ENCOUNTER — Telehealth: Payer: Self-pay | Admitting: Hematology and Oncology

## 2016-01-10 ENCOUNTER — Encounter: Payer: Self-pay | Admitting: Hematology and Oncology

## 2016-01-10 ENCOUNTER — Ambulatory Visit (HOSPITAL_BASED_OUTPATIENT_CLINIC_OR_DEPARTMENT_OTHER): Payer: Federal, State, Local not specified - PPO | Admitting: Hematology and Oncology

## 2016-01-10 VITALS — BP 113/92 | HR 82 | Temp 98.5°F | Resp 18 | Ht 65.0 in | Wt 245.2 lb

## 2016-01-10 DIAGNOSIS — D649 Anemia, unspecified: Secondary | ICD-10-CM

## 2016-01-10 DIAGNOSIS — C50412 Malignant neoplasm of upper-outer quadrant of left female breast: Secondary | ICD-10-CM | POA: Diagnosis not present

## 2016-01-10 DIAGNOSIS — R5383 Other fatigue: Secondary | ICD-10-CM | POA: Diagnosis not present

## 2016-01-10 LAB — CBC WITH DIFFERENTIAL/PLATELET
BASO%: 0.5 % (ref 0.0–2.0)
BASOS ABS: 0 10*3/uL (ref 0.0–0.1)
EOS%: 2.1 % (ref 0.0–7.0)
Eosinophils Absolute: 0.1 10*3/uL (ref 0.0–0.5)
HEMATOCRIT: 36.1 % (ref 34.8–46.6)
HGB: 11.7 g/dL (ref 11.6–15.9)
LYMPH#: 1.2 10*3/uL (ref 0.9–3.3)
LYMPH%: 29.3 % (ref 14.0–49.7)
MCH: 29.5 pg (ref 25.1–34.0)
MCHC: 32.3 g/dL (ref 31.5–36.0)
MCV: 91.1 fL (ref 79.5–101.0)
MONO#: 0.3 10*3/uL (ref 0.1–0.9)
MONO%: 8.2 % (ref 0.0–14.0)
NEUT#: 2.5 10*3/uL (ref 1.5–6.5)
NEUT%: 59.9 % (ref 38.4–76.8)
PLATELETS: 372 10*3/uL (ref 145–400)
RBC: 3.96 10*6/uL (ref 3.70–5.45)
RDW: 18.3 % — ABNORMAL HIGH (ref 11.2–14.5)
WBC: 4.2 10*3/uL (ref 3.9–10.3)

## 2016-01-10 LAB — COMPREHENSIVE METABOLIC PANEL
ALK PHOS: 56 U/L (ref 40–150)
ALT: 57 U/L — ABNORMAL HIGH (ref 0–55)
AST: 35 U/L — AB (ref 5–34)
Albumin: 3.7 g/dL (ref 3.5–5.0)
Anion Gap: 7 mEq/L (ref 3–11)
BUN: 13.2 mg/dL (ref 7.0–26.0)
CHLORIDE: 111 meq/L — AB (ref 98–109)
CO2: 25 meq/L (ref 22–29)
Calcium: 10.3 mg/dL (ref 8.4–10.4)
Creatinine: 0.9 mg/dL (ref 0.6–1.1)
GLUCOSE: 101 mg/dL (ref 70–140)
POTASSIUM: 3.7 meq/L (ref 3.5–5.1)
SODIUM: 142 meq/L (ref 136–145)
Total Bilirubin: 0.48 mg/dL (ref 0.20–1.20)
Total Protein: 7 g/dL (ref 6.4–8.3)

## 2016-01-10 MED ORDER — PREDNISONE 50 MG PO TABS: 50.0000 mg | ORAL_TABLET | ORAL | Status: DC

## 2016-01-10 NOTE — Telephone Encounter (Signed)
appt made and avs printed °

## 2016-01-10 NOTE — Assessment & Plan Note (Signed)
Left breast biopsy 08/17/2015:1:30 position: Invasive ductal carcinoma, grade 3, ER 0%, PR 0%, HER-2 negative ratio 1.48, Ki-67 90%, 3.1 cm tumor, axilla negative, T2 N0 stage II a clinical stage  Treatment plan: 1. Neoadjuvant chemotherapy with dose dense Adriamycin and Cytoxan 4 followed by Taxol and carboplatin weekly 12 2. Followed by surgery 3. Followed by adjuvant radiation Breast biopsies x 2: 09/28/2015 : PASH -------------------------------------------------------------------------------------------------------------------------------------------------- Current treatment: Completed Cycle 4 dose dense Adriamycin and Cytoxan on 10/31/15; completed cycle 3 Taxol and carboplatin; today is cycle 7 Taxol alone weekly  Echocardiogram 09/13/2015: EF 55-60% Labs were reviewed.  Normocytic anemia: work up revealed iron deficiency: Received IV iron treatment with chemotherapy Chemotoxicities: 1. Severe nausea and vomiting: Patient continues to have profound nausea and vomiting in spite of her maximal preventative efforts to decrease nausea risk. I discussed with her about discontinuation of further chemotherapy based on poor tolerance to treatment. 2. Hair loss 3. Conjunctivitis versus corneal abrasion: Resolved with steroid eyedrops 4. Insomnia: I encouraged her to take 2 Ativan once at bedtime. 5. Fever up to 100F: Resolved.  7. Dehydration: receives IV fluids with normal saline along with Zofran on Thursdays weekly. 8. Diarrhea: Improved with Imodium 9. Skin dryness leading to itching: I instructed her to use Vaseline on Aquaphor 10. Diarrhea alternating with constipation:  11. Bilateral chest wall pains: No PE. Probably musculoskeletal in nature 12. Neuropathy grade 1 especially in the right hand: we'll monitor this closely  Anemia:Hemoglobin electrophoresis 10/31/2015: Normal . Her daughter and her mother are all anemic. Her hemoglobin is 11.8 mg Hospitalization for intractable  nausea vomiting diarrhea of 12/08/2015- 12/12/2015  Monitoring very closely for chemotherapy toxicities. Return to clinic with cycle 7 of Taxol

## 2016-01-10 NOTE — [facility] Notes (Signed)
Patient Care Team: [name], MD as PCP - General (Internal Medicine)  SUMMARY OF ONCOLOGIC HISTORY:   Breast cancer of upper-outer quadrant of left female breast ([place])   08/17/2015 Initial Diagnosis left breast biopsy 1:30 position: Invasive ductal carcinoma, grade 3, ER 0%, PR 0%, HER-2 negative ratio 1.48, Ki-67 90%, 3.1 cm tumor, axilla negative, T2 N0 stage II a clinical stage   09/19/2015 -  Neo-Adjuvant Chemotherapy Neoadjuvant chemotherapy with dose dense Adriamycin and Cytoxan 4 followed by Taxol and carboplatin weekly 12   09/28/2015 Procedure  Left breast biopsy anterior third left upper outer quadrant: PASH;  upper inner quadrant: PASH    CHIEF COMPLIANT: cycle 7 of Taxol ( discontinuing chemotherapy today)  INTERVAL HISTORY: Cindy Bradshaw is a 45 year old with above-mentioned history of left breast cancer currently on neoadjuvant chemotherapy and today is supposed to be cycle 7 of Taxol. She is not going to get any more chemotherapy because of previous experience with profound nausea and vomiting after each round of chemotherapy. She was getting more weak and dehydrated with each treatment. She's been dreading coming here to receive more chemotherapy. Denies any neuropathy. However with each treatment her fatigue has been getting worse.  REVIEW OF SYSTEMS:   Constitutional: Denies fevers, chills or abnormal weight loss Eyes: Denies blurriness of vision Ears, nose, mouth, throat, and face: Denies mucositis or sore throat Respiratory: Denies cough, dyspnea or wheezes Cardiovascular: Denies palpitation, chest discomfort Gastrointestinal:  Denies nausea, heartburn or change in bowel habits Skin: Denies abnormal skin rashes Lymphatics: Denies new lymphadenopathy or easy bruising Neurological:Denies numbness, tingling or new weaknesses Behavioral/Psych: Mood is stable, no new changes  Extremities: No lower extremity edema Breast:  denies any pain or lumps or  nodules in either breasts All other systems were reviewed with the patient and are negative.  I have reviewed the past medical history, past surgical history, social history and family history with the patient and they are unchanged from previous note.  ALLERGIES:  is allergic to tape; dilaudid; ivp dye; percocet; and toradol.  MEDICATIONS:  Current Outpatient Prescriptions  Medication Sig Dispense Refill  . ciprofloxacin (CIPRO) 500 MG tablet Take 1 tablet (500 mg total) by mouth 2 (two) times daily. 14 tablet 0  . diphenoxylate-atropine (LOMOTIL) 2.5-0.025 MG tablet Take 2 tablets by mouth 4 (four) times daily as needed for diarrhea or loose stools. (Patient not taking: Reported on 12/13/2015) 30 tablet 0  . Hydrocodone-Acetaminophen 5-300 MG TABS Take 1 tablet by mouth every 8 (eight) hours as needed (Chest and rib pain). 60 each 0  . lidocaine-prilocaine (EMLA) cream Apply to affected area as directed.  3  . LORazepam (ATIVAN) 0.5 MG tablet Take 1 tablet (0.5 mg total) by mouth at bedtime. 30 tablet 0  . ondansetron (ZOFRAN) 8 MG tablet Take 1 tablet (8 mg total) by mouth every 8 (eight) hours as needed for nausea or vomiting. 30 tablet 1  . predniSONE (DELTASONE) 50 MG tablet Take 1 tablet (50 mg total) by mouth as directed. Take 86m at midnight, 6:00am, and 12:00pm. 3 tablet 0  . PROAIR HFA 108 (90 BASE) MCG/ACT inhaler Inhale 2 puffs into the lungs every 4 (four) hours as needed. Reported on 12/13/2015  3  . prochlorperazine (COMPAZINE) 10 MG tablet Take 1 tablet (10 mg total) by mouth every 6 (six) hours as needed for nausea or vomiting. 30 tablet 0  . traMADol (ULTRAM) 50 MG tablet Take 1 tablet (50 mg total) by mouth  every 6 (six) hours as needed for moderate pain or severe pain. 20 tablet 0  . triamcinolone cream (KENALOG) 0.1 % Apply 1 application topically 2 (two) times daily. 160 g 1   No current facility-administered medications for this visit.   Facility-Administered  Medications Ordered in Other Visits  Medication Dose Route Frequency Provider Last Rate Last Dose  . 0.9 %  sodium chloride infusion   Intravenous Once Laurie Panda, NP        PHYSICAL EXAMINATION: ECOG PERFORMANCE STATUS: 1 - Symptomatic but completely ambulatory  Filed Vitals:   01/10/16 1204  BP: 113/92  Pulse: 82  Temp: 98.5 F (36.9 C)  Resp: 18   Filed Weights   01/10/16 1204  Weight: 245 lb 3.2 oz (111.222 kg)    GENERAL:alert, no distress and comfortable SKIN: skin color, texture, turgor are normal, no rashes or significant lesions EYES: normal, Conjunctiva are pink and non-injected, sclera clear OROPHARYNX:no exudate, no erythema and lips, buccal mucosa, and tongue normal  NECK: supple, thyroid normal size, non-tender, without nodularity LYMPH:  no palpable lymphadenopathy in the cervical, axillary or inguinal LUNGS: clear to auscultation and percussion with normal breathing effort HEART: regular rate & rhythm and no murmurs and no lower extremity edema ABDOMEN:abdomen soft, non-tender and normal bowel sounds MUSCULOSKELETAL:no cyanosis of digits and no clubbing  NEURO: alert & oriented x 3 with fluent speech, no focal motor/sensory deficits EXTREMITIES: No lower extremity edema  LABORATORY DATA:  I have reviewed the data as listed   Chemistry      Component Value Date/Time   NA 142 01/10/2016 1142   NA 141 12/12/2015 0332   K 3.7 01/10/2016 1142   K 3.7 12/12/2015 0332   CL 110 12/12/2015 0332   CO2 25 01/10/2016 1142   CO2 24 12/12/2015 0332   BUN 13.2 01/10/2016 1142   BUN 6 12/12/2015 0332   CREATININE 0.9 01/10/2016 1142   CREATININE 0.61 12/12/2015 0332      Component Value Date/Time   CALCIUM 10.3 01/10/2016 1142   CALCIUM 9.6 12/12/2015 0332   ALKPHOS 56 01/10/2016 1142   ALKPHOS 53 12/09/2015 0452   AST 35* 01/10/2016 1142   AST 178* 12/09/2015 0452   ALT 57* 01/10/2016 1142   ALT 142* 12/09/2015 0452   BILITOT 0.48 01/10/2016 1142    BILITOT 1.0 12/09/2015 0452       Lab Results  Component Value Date   WBC 4.2 01/10/2016   HGB 11.7 01/10/2016   HCT 36.1 01/10/2016   MCV 91.1 01/10/2016   PLT 372 01/10/2016   NEUTROABS 2.5 01/10/2016     ASSESSMENT & PLAN:  Breast cancer of upper-outer quadrant of left female breast ([place]) Left breast biopsy 08/17/2015:1:30 position: Invasive ductal carcinoma, grade 3, ER 0%, PR 0%, HER-2 negative ratio 1.48, Ki-67 90%, 3.1 cm tumor, axilla negative, T2 N0 stage II a clinical stage  Treatment plan: 1. Neoadjuvant chemotherapy with dose dense Adriamycin and Cytoxan 4 followed by Taxol and carboplatin weekly 12 2. Followed by surgery 3. Followed by adjuvant radiation Breast biopsies x 2: 09/28/2015 : PASH -------------------------------------------------------------------------------------------------------------------------------------------------- Current treatment: Completed Cycle 4 dose dense Adriamycin and Cytoxan on 10/31/15; completed cycle 3 Taxol and carboplatin; today is cycle 7 Taxol alone weekly ( it will be discontinued)  Echocardiogram 09/13/2015: EF 55-60% Labs were reviewed.  Normocytic anemia: work up revealed iron deficiency: Received IV iron treatment with chemotherapy Chemotoxicities: 1. Severe nausea and vomiting: Patient continues to have profound nausea and  vomiting in spite of her maximal preventative efforts to decrease nausea risk. I discussed with her about discontinuation of further chemotherapy based on poor tolerance to treatment. 2. Hair loss 3. Conjunctivitis versus corneal abrasion: Resolved with steroid eyedrops 4. Insomnia: I encouraged her to take 2 Ativan once at bedtime. 5. Fever up to 100F: Resolved.  7. Dehydration: receives IV fluids with normal saline along with Zofran on Thursdays weekly. 8. Diarrhea: Improved with Imodium 9. Skin dryness leading to itching: I instructed her to use Vaseline on Aquaphor 10. Diarrhea alternating  with constipation:  11. Bilateral chest wall pains: No PE. Probably musculoskeletal in nature 12. Neuropathy grade 1 especially in the right hand: we'll monitor this closely  Anemia:Hemoglobin electrophoresis 10/31/2015: Normal . Her daughter and her mother are all anemic. Her hemoglobin is 11.8 mg Hospitalization for intractable nausea vomiting diarrhea of 12/08/2015- 12/12/2015  We are discontinuing chemotherapy because of toxicity issues. Breast MRI will be scheduled for Saturday. She will come back next week  after tumor board presentation. Monitoring very closely for chemotherapy toxicities.    Orders Placed This Encounter  Procedures  . MR Breast Bilateral W Wo Contrast    EPIC ORDER/ LABS DRAWN 01/10/16 IN EPIC/ DX: LT BREAST CA/ PF:09/28/15 IN EPIC [id number]/CLAUS/NO PREV SX/NO PACEMAKER,STIMULATOR OR DEFIBRILLATOR/NO METAL IN EYES/NO EYES,EARS OR HEART SX/NO STENTS/NO IMPLANTS/NO KIDNEY OR LIVER DZ/NO LUPUS,RA OR SCLERODERMA/NO HTN/NOT DIAB/NO NEEDS/INS- BCBS/CLC/KEISHA    Standing Status: Future     Number of Occurrences:      Standing Expiration Date: 03/11/2017    Order Specific Question:  If indicated for the ordered procedure, I authorize the administration of contrast media per Radiology protocol    Answer:  Yes    Order Specific Question:  Reason for Exam (SYMPTOM  OR DIAGNOSIS REQUIRED)    Answer:  Breast cancer    Order Specific Question:  Preferred imaging location?    Answer:  [id number]    Order Specific Question:  Does the patient have a pacemaker or implanted devices?    Answer:  No    Order Specific Question:  What is the patient's sedation requirement?    Answer:  No Sedation   The patient has a good understanding of the overall plan. she agrees with it. she will call with any problems that may develop before the next visit here.   Rulon Eisenmenger, MD 01/10/2016

## 2016-01-12 ENCOUNTER — Ambulatory Visit: Payer: Self-pay

## 2016-01-12 ENCOUNTER — Encounter: Payer: Self-pay | Admitting: *Deleted

## 2016-01-14 ENCOUNTER — Ambulatory Visit
Admission: RE | Admit: 2016-01-14 | Discharge: 2016-01-14 | Disposition: A | Discharge status: Home / Self Care | Payer: Federal, State, Local not specified - PPO | Source: Ambulatory Visit | Attending: Hematology and Oncology | Admitting: Hematology and Oncology

## 2016-01-14 DIAGNOSIS — C50412 Malignant neoplasm of upper-outer quadrant of left female breast: Secondary | ICD-10-CM

## 2016-01-14 MED ORDER — GADOBENATE DIMEGLUMINE 529 MG/ML IV SOLN
20.0000 mL | Freq: Once | INTRAVENOUS | Status: AC | PRN
  Administered 2016-01-14: 20 mL via INTRAVENOUS

## 2016-01-17 ENCOUNTER — Other Ambulatory Visit: Payer: Self-pay

## 2016-01-17 ENCOUNTER — Ambulatory Visit: Payer: Self-pay

## 2016-01-18 ENCOUNTER — Encounter: Payer: Self-pay | Admitting: Hematology and Oncology

## 2016-01-18 ENCOUNTER — Ambulatory Visit (HOSPITAL_BASED_OUTPATIENT_CLINIC_OR_DEPARTMENT_OTHER): Payer: Federal, State, Local not specified - PPO | Admitting: Hematology and Oncology

## 2016-01-18 VITALS — BP 128/70 | HR 99 | Temp 98.8°F | Resp 18 | Wt 246.2 lb

## 2016-01-18 DIAGNOSIS — C50412 Malignant neoplasm of upper-outer quadrant of left female breast: Secondary | ICD-10-CM | POA: Diagnosis not present

## 2016-01-18 NOTE — Assessment & Plan Note (Signed)
Left breast biopsy 08/17/2015:1:30 position: Invasive ductal carcinoma, grade 3, ER 0%, PR 0%, HER-2 negative ratio 1.48, Ki-67 90%, 3.1 cm tumor, axilla negative, T2 N0 stage II a clinical stage  Treatment plan: 1. Neoadjuvant chemotherapy with dose dense Adriamycin and Cytoxan 4 followed by Taxol and carboplatin weekly 12 2. Followed by surgery 3. Followed by adjuvant radiation Breast biopsies x 2: 09/28/2015 : PASH -------------------------------------------------------------------------------------------------------------------------------------------------- Treatment summary: Completed Cycle 4 dose dense Adriamycin and Cytoxan on 10/31/15; completed cycle 3 Taxol and carboplatin, completed 7 cycles of Taxol and treatment discontinued for poor tolerance  Breast MRI 01/16/2016:significant response to chemotherapy with decrease in the tumor from 5.1 cm to 2.9 cm, also decrease a non-mass enhancement  We presented her case in the breast tumor board and she will undergo lumpectomy and I will see her back after lumpectomy to discuss the final pathology report. She may be eligible to participate in several clinical trials including the ectopy opened Pembrolizumab adjuvant clinical trial. If she has significant residual disease she may also benefit from adjuvant Xeloda.  Return to clinic after surgery to discuss the final pathology report

## 2016-01-18 NOTE — Progress Notes (Signed)
Patient Care Team: [name], MD as PCP - General (Internal Medicine)   SUMMARY OF ONCOLOGIC HISTORY:   Breast cancer of upper-outer quadrant of left female breast ([place])   08/17/2015 Initial Diagnosis left breast biopsy 1:30 position: Invasive ductal carcinoma, grade 3, ER 0%, PR 0%, HER-2 negative ratio 1.48, Ki-67 90%, 3.1 cm tumor, axilla negative, T2 N0 stage II a clinical stage   09/19/2015 - 01/10/2016 Neo-Adjuvant Chemotherapy Neoadjuvant chemotherapy with dose dense Adriamycin and Cytoxan 4 followed by Taxol and carboplatin weekly 6   09/28/2015 Procedure  Left breast biopsy anterior third left upper outer quadrant: PASH;  upper inner quadrant: [place]   01/16/2016 Breast MRI significant response to chemotherapy with decrease in the tumor from 5.1 cm to 2.9 cm, also decrease a non-mass enhancement    CHIEF COMPLIANT: follow-up to discuss breast MRI  INTERVAL HISTORY: Cindy Bradshaw is a 44-year-old with above-mentioned history of left breast cancer who completed neoadjuvant chemotherapy and underwent post neoadjuvant breast MRI and is here to discuss the results. She reports feeling a lot better in terms of nausea and vomiting. Her hair is starting to come back. She is accompanied by her husband today.  REVIEW OF SYSTEMS:   Constitutional: Denies fevers, chills or abnormal weight loss Eyes: Denies blurriness of vision Ears, nose, mouth, throat, and face: Denies mucositis or sore throat Respiratory: Denies cough, dyspnea or wheezes Cardiovascular: Denies palpitation, chest discomfort Gastrointestinal:  Denies nausea, heartburn or change in bowel habits, marked improvement in nausea and vomiting Skin: Denies abnormal skin rashes Lymphatics: Denies new lymphadenopathy or easy bruising Neurological:Denies numbness, tingling or new weaknesses Behavioral/Psych: Mood is stable, no new changes  Extremities: No lower extremity edema Breast:  denies any pain or lumps or  nodules in either breasts All other systems were reviewed with the patient and are negative.  I have reviewed the past medical history, past surgical history, social history and family history with the patient and they are unchanged from previous note.  ALLERGIES:  is allergic to tape; dilaudid; ivp dye; percocet; and toradol.  MEDICATIONS:  Current Outpatient Prescriptions  Medication Sig Dispense Refill  . ciprofloxacin (CIPRO) 500 MG tablet Take 1 tablet (500 mg total) by mouth 2 (two) times daily. 14 tablet 0  . diphenoxylate-atropine (LOMOTIL) 2.5-0.025 MG tablet Take 2 tablets by mouth 4 (four) times daily as needed for diarrhea or loose stools. (Patient not taking: Reported on 12/13/2015) 30 tablet 0  . Hydrocodone-Acetaminophen 5-300 MG TABS Take 1 tablet by mouth every 8 (eight) hours as needed (Chest and rib pain). 60 each 0  . lidocaine-prilocaine (EMLA) cream Apply to affected area as directed.  3  . LORazepam (ATIVAN) 0.5 MG tablet Take 1 tablet (0.5 mg total) by mouth at bedtime. 30 tablet 0  . ondansetron (ZOFRAN) 8 MG tablet Take 1 tablet (8 mg total) by mouth every 8 (eight) hours as needed for nausea or vomiting. 30 tablet 1  . predniSONE (DELTASONE) 50 MG tablet Take 1 tablet (50 mg total) by mouth as directed. Take 28m at 2am, 9am, and 2pm. 3 tablet 0  . PROAIR HFA 108 (90 BASE) MCG/ACT inhaler Inhale 2 puffs into the lungs every 4 (four) hours as needed. Reported on 12/13/2015  3  . prochlorperazine (COMPAZINE) 10 MG tablet Take 1 tablet (10 mg total) by mouth every 6 (six) hours as needed for nausea or vomiting. 30 tablet 0  . traMADol (ULTRAM) 50 MG tablet Take 1 tablet (50 mg  total) by mouth every 6 (six) hours as needed for moderate pain or severe pain. 20 tablet 0  . triamcinolone cream (KENALOG) 0.1 % Apply 1 application topically 2 (two) times daily. 160 g 1   No current facility-administered medications for this visit.   Facility-Administered Medications Ordered in  Other Visits  Medication Dose Route Frequency Provider Last Rate Last Dose  . 0.9 %  sodium chloride infusion   Intravenous Once Laurie Panda, NP        PHYSICAL EXAMINATION: ECOG PERFORMANCE STATUS: 1 - Symptomatic but completely ambulatory  Filed Vitals:   01/18/16 1103  BP: 128/70  Pulse: 99  Temp: 98.8 F (37.1 C)  Resp: 18   Filed Weights   01/18/16 1103  Weight: 246 lb 3.2 oz (111.676 kg)    GENERAL:alert, no distress and comfortable SKIN: skin color, texture, turgor are normal, no rashes or significant lesions EYES: normal, Conjunctiva are pink and non-injected, sclera clear OROPHARYNX:no exudate, no erythema and lips, buccal mucosa, and tongue normal  NECK: supple, thyroid normal size, non-tender, without nodularity LYMPH:  no palpable lymphadenopathy in the cervical, axillary or inguinal LUNGS: clear to auscultation and percussion with normal breathing effort HEART: regular rate & rhythm and no murmurs and no lower extremity edema ABDOMEN:abdomen soft, non-tender and normal bowel sounds MUSCULOSKELETAL:no cyanosis of digits and no clubbing  NEURO: alert & oriented x 3 with fluent speech, no focal motor/sensory deficits EXTREMITIES: No lower extremity edema  LABORATORY DATA:  I have reviewed the data as listed   Chemistry      Component Value Date/Time   NA 142 01/10/2016 1142   NA 141 12/12/2015 0332   K 3.7 01/10/2016 1142   K 3.7 12/12/2015 0332   CL 110 12/12/2015 0332   CO2 25 01/10/2016 1142   CO2 24 12/12/2015 0332   BUN 13.2 01/10/2016 1142   BUN 6 12/12/2015 0332   CREATININE 0.9 01/10/2016 1142   CREATININE 0.61 12/12/2015 0332      Component Value Date/Time   CALCIUM 10.3 01/10/2016 1142   CALCIUM 9.6 12/12/2015 0332   ALKPHOS 56 01/10/2016 1142   ALKPHOS 53 12/09/2015 0452   AST 35* 01/10/2016 1142   AST 178* 12/09/2015 0452   ALT 57* 01/10/2016 1142   ALT 142* 12/09/2015 0452   BILITOT 0.48 01/10/2016 1142   BILITOT 1.0 12/09/2015  0452       Lab Results  Component Value Date   WBC 4.2 01/10/2016   HGB 11.7 01/10/2016   HCT 36.1 01/10/2016   MCV 91.1 01/10/2016   PLT 372 01/10/2016   NEUTROABS 2.5 01/10/2016     ASSESSMENT & PLAN:  Breast cancer of upper-outer quadrant of left female breast ([place]) Left breast biopsy 08/17/2015:1:30 position: Invasive ductal carcinoma, grade 3, ER 0%, PR 0%, HER-2 negative ratio 1.48, Ki-67 90%, 3.1 cm tumor, axilla negative, T2 N0 stage II a clinical stage  Treatment plan: 1. Neoadjuvant chemotherapy with dose dense Adriamycin and Cytoxan 4 followed by Taxol and carboplatin weekly 12 2. Followed by surgery 3. Followed by adjuvant radiation Breast biopsies x 2: 09/28/2015 : PASH -------------------------------------------------------------------------------------------------------------------------------------------------- Treatment summary: Completed Cycle 4 dose dense Adriamycin and Cytoxan on 10/31/15; completed cycle 3 Taxol and carboplatin, completed 7 cycles of Taxol and treatment discontinued for poor tolerance  Breast MRI 01/16/2016:significant response to chemotherapy with decrease in the tumor from 5.1 cm to 2.9 cm, also decrease a non-mass enhancement  We presented her case in the breast tumor board  and she will undergo lumpectomy and I will see her back after lumpectomy to discuss the final pathology report. She may be eligible to participate in several clinical trials including the ectopy opened Pembrolizumab adjuvant clinical trial. If she has significant residual disease she may also benefit from adjuvant Xeloda.  Return to clinic after surgery to discuss the final pathology report    No orders of the defined types were placed in this encounter.   The patient has a good understanding of the overall plan. she agrees with it. she will call with any problems that may develop before the next visit here.   Rulon Eisenmenger, MD 01/18/2016

## 2016-01-19 ENCOUNTER — Ambulatory Visit: Payer: Self-pay

## 2016-01-20 ENCOUNTER — Encounter: Payer: Self-pay | Admitting: *Deleted

## 2016-01-20 ENCOUNTER — Other Ambulatory Visit: Payer: Self-pay | Admitting: General Surgery

## 2016-01-20 DIAGNOSIS — C50412 Malignant neoplasm of upper-outer quadrant of left female breast: Secondary | ICD-10-CM

## 2016-01-20 NOTE — [facility] Notes (Signed)
[facility] Work  Clinical Social Work was referred by [employer], Location manager for assessment of psychosocial needs due to possible need for transportation assistance.  Clinical Social Worker attempted to contact patient at home and left vm explaining possible resources, role of CSW team and to please call back to further discuss needs.    Clinical Social Work interventions: Resource education  Loren Racer, [place] Worker [place]  [place] Phone: [phone] Fax: [phone]

## 2016-01-21 ENCOUNTER — Encounter (HOSPITAL_COMMUNITY): Payer: Self-pay | Admitting: Emergency Medicine

## 2016-01-21 ENCOUNTER — Observation Stay (HOSPITAL_COMMUNITY)
Admission: EM | Admit: 2016-01-21 | Discharge: 2016-01-25 | Disposition: A | Discharge status: Home / Self Care | Payer: Federal, State, Local not specified - PPO | Attending: Internal Medicine | Admitting: Internal Medicine

## 2016-01-21 ENCOUNTER — Emergency Department (HOSPITAL_COMMUNITY): Payer: Federal, State, Local not specified - PPO

## 2016-01-21 ENCOUNTER — Other Ambulatory Visit: Payer: Self-pay

## 2016-01-21 DIAGNOSIS — Z91041 Radiographic dye allergy status: Secondary | ICD-10-CM

## 2016-01-21 DIAGNOSIS — R52 Pain, unspecified: Secondary | ICD-10-CM

## 2016-01-21 DIAGNOSIS — G620 Drug-induced polyneuropathy: Secondary | ICD-10-CM | POA: Diagnosis not present

## 2016-01-21 DIAGNOSIS — I200 Unstable angina: Secondary | ICD-10-CM | POA: Diagnosis not present

## 2016-01-21 DIAGNOSIS — T508X5A Adverse effect of diagnostic agents, initial encounter: Secondary | ICD-10-CM | POA: Diagnosis not present

## 2016-01-21 DIAGNOSIS — J45909 Unspecified asthma, uncomplicated: Secondary | ICD-10-CM | POA: Diagnosis not present

## 2016-01-21 DIAGNOSIS — D473 Essential (hemorrhagic) thrombocythemia: Secondary | ICD-10-CM | POA: Insufficient documentation

## 2016-01-21 DIAGNOSIS — T451X5A Adverse effect of antineoplastic and immunosuppressive drugs, initial encounter: Secondary | ICD-10-CM | POA: Diagnosis present

## 2016-01-21 DIAGNOSIS — Z853 Personal history of malignant neoplasm of breast: Secondary | ICD-10-CM | POA: Insufficient documentation

## 2016-01-21 DIAGNOSIS — M1990 Unspecified osteoarthritis, unspecified site: Secondary | ICD-10-CM | POA: Insufficient documentation

## 2016-01-21 DIAGNOSIS — I2699 Other pulmonary embolism without acute cor pulmonale: Secondary | ICD-10-CM

## 2016-01-21 DIAGNOSIS — Z79899 Other long term (current) drug therapy: Secondary | ICD-10-CM | POA: Insufficient documentation

## 2016-01-21 DIAGNOSIS — R0602 Shortness of breath: Secondary | ICD-10-CM | POA: Diagnosis not present

## 2016-01-21 DIAGNOSIS — R079 Chest pain, unspecified: Secondary | ICD-10-CM

## 2016-01-21 DIAGNOSIS — D649 Anemia, unspecified: Secondary | ICD-10-CM | POA: Diagnosis not present

## 2016-01-21 DIAGNOSIS — R42 Dizziness and giddiness: Secondary | ICD-10-CM

## 2016-01-21 DIAGNOSIS — R61 Generalized hyperhidrosis: Secondary | ICD-10-CM

## 2016-01-21 DIAGNOSIS — D75839 Thrombocytosis, unspecified: Secondary | ICD-10-CM

## 2016-01-21 DIAGNOSIS — C50412 Malignant neoplasm of upper-outer quadrant of left female breast: Secondary | ICD-10-CM

## 2016-01-21 DIAGNOSIS — Y92239 Unspecified place in hospital as the place of occurrence of the external cause: Secondary | ICD-10-CM | POA: Diagnosis not present

## 2016-01-21 DIAGNOSIS — Z9221 Personal history of antineoplastic chemotherapy: Secondary | ICD-10-CM | POA: Diagnosis not present

## 2016-01-21 DIAGNOSIS — M25571 Pain in right ankle and joints of right foot: Secondary | ICD-10-CM

## 2016-01-21 DIAGNOSIS — R9439 Abnormal result of other cardiovascular function study: Secondary | ICD-10-CM | POA: Diagnosis not present

## 2016-01-21 DIAGNOSIS — R110 Nausea: Secondary | ICD-10-CM

## 2016-01-21 DIAGNOSIS — R0789 Other chest pain: Secondary | ICD-10-CM | POA: Diagnosis not present

## 2016-01-21 LAB — BASIC METABOLIC PANEL
Anion gap: 7 (ref 5–15)
BUN: 14 mg/dL (ref 6–20)
CO2: 25 mmol/L (ref 22–32)
CREATININE: 0.84 mg/dL (ref 0.44–1.00)
Calcium: 10.5 mg/dL — ABNORMAL HIGH (ref 8.9–10.3)
Chloride: 110 mmol/L (ref 101–111)
Glucose, Bld: 102 mg/dL — ABNORMAL HIGH (ref 65–99)
Potassium: 3.6 mmol/L (ref 3.5–5.1)
SODIUM: 142 mmol/L (ref 135–145)

## 2016-01-21 LAB — CBC
HCT: 37.8 % (ref 36.0–46.0)
Hemoglobin: 12.7 g/dL (ref 12.0–15.0)
MCH: 30.1 pg (ref 26.0–34.0)
MCHC: 33.6 g/dL (ref 30.0–36.0)
MCV: 89.6 fL (ref 78.0–100.0)
PLATELETS: 416 10*3/uL — AB (ref 150–400)
RBC: 4.22 MIL/uL (ref 3.87–5.11)
RDW: 15.4 % (ref 11.5–15.5)
WBC: 5.4 10*3/uL (ref 4.0–10.5)

## 2016-01-21 LAB — PROTIME-INR
INR: 1.02 (ref 0.00–1.49)
Prothrombin Time: 13.2 seconds (ref 11.6–15.2)

## 2016-01-21 LAB — I-STAT TROPONIN, ED: TROPONIN I, POC: 0.03 ng/mL (ref 0.00–0.08)

## 2016-01-21 LAB — TROPONIN I: Troponin I: 0.04 ng/mL — ABNORMAL HIGH (ref <0.031)

## 2016-01-21 LAB — BRAIN NATRIURETIC PEPTIDE: B NATRIURETIC PEPTIDE 5: 10.9 pg/mL (ref 0.0–100.0)

## 2016-01-21 MED ORDER — DICLOFENAC SODIUM 1 % TD GEL
2.0000 g | Freq: Four times a day (QID) | TRANSDERMAL | Status: DC
  Administered 2016-01-21 – 2016-01-25 (×8): 2 g via TOPICAL
  Filled 2016-01-21: qty 100

## 2016-01-21 MED ORDER — HEPARIN BOLUS VIA INFUSION
4500.0000 [IU] | Freq: Once | INTRAVENOUS | Status: AC
  Administered 2016-01-21: 4500 [IU] via INTRAVENOUS
  Filled 2016-01-21: qty 4500

## 2016-01-21 MED ORDER — MORPHINE SULFATE (PF) 4 MG/ML IV SOLN
4.0000 mg | INTRAVENOUS | Status: AC | PRN
  Administered 2016-01-21 – 2016-01-23 (×4): 4 mg via INTRAVENOUS
  Filled 2016-01-21 (×4): qty 1

## 2016-01-21 MED ORDER — SODIUM CHLORIDE 0.9 % IV SOLN
Freq: Once | INTRAVENOUS | Status: AC
  Administered 2016-01-21: 17:00:00 via INTRAVENOUS

## 2016-01-21 MED ORDER — NITROGLYCERIN 0.4 MG SL SUBL
0.4000 mg | SUBLINGUAL_TABLET | SUBLINGUAL | Status: DC | PRN
  Administered 2016-01-21: 0.4 mg via SUBLINGUAL

## 2016-01-21 MED ORDER — GI COCKTAIL ~~LOC~~
30.0000 mL | Freq: Once | ORAL | Status: AC
  Administered 2016-01-21: 30 mL via ORAL
  Filled 2016-01-21: qty 30

## 2016-01-21 MED ORDER — ONDANSETRON HCL 4 MG/2ML IJ SOLN
4.0000 mg | Freq: Four times a day (QID) | INTRAMUSCULAR | Status: DC | PRN
  Administered 2016-01-21: 4 mg via INTRAVENOUS
  Filled 2016-01-21: qty 2

## 2016-01-21 MED ORDER — ONDANSETRON HCL 4 MG/2ML IJ SOLN
4.0000 mg | Freq: Once | INTRAMUSCULAR | Status: AC
  Administered 2016-01-21: 4 mg via INTRAVENOUS
  Filled 2016-01-21: qty 2

## 2016-01-21 MED ORDER — ASPIRIN 81 MG PO CHEW
324.0000 mg | CHEWABLE_TABLET | Freq: Once | ORAL | Status: AC
  Administered 2016-01-21: 324 mg via ORAL
  Filled 2016-01-21: qty 4

## 2016-01-21 MED ORDER — PREDNISONE 50 MG PO TABS
50.0000 mg | ORAL_TABLET | Freq: Four times a day (QID) | ORAL | Status: AC
  Administered 2016-01-21 – 2016-01-22 (×3): 50 mg via ORAL
  Filled 2016-01-21 (×3): qty 1

## 2016-01-21 MED ORDER — MORPHINE SULFATE (PF) 4 MG/ML IV SOLN
4.0000 mg | Freq: Once | INTRAVENOUS | Status: AC
  Administered 2016-01-21: 4 mg via INTRAVENOUS
  Filled 2016-01-21: qty 1

## 2016-01-21 MED ORDER — DIPHENHYDRAMINE HCL 25 MG PO CAPS
50.0000 mg | ORAL_CAPSULE | Freq: Once | ORAL | Status: AC
  Administered 2016-01-21: 50 mg via ORAL
  Filled 2016-01-21: qty 2

## 2016-01-21 MED ORDER — ASPIRIN EC 81 MG PO TBEC
81.0000 mg | DELAYED_RELEASE_TABLET | Freq: Every day | ORAL | Status: DC
  Administered 2016-01-22 – 2016-01-25 (×4): 81 mg via ORAL
  Filled 2016-01-21 (×4): qty 1

## 2016-01-21 MED ORDER — HEPARIN (PORCINE) IN NACL 100-0.45 UNIT/ML-% IJ SOLN
1200.0000 [IU]/h | INTRAMUSCULAR | Status: DC
  Administered 2016-01-21: 1200 [IU]/h via INTRAVENOUS
  Filled 2016-01-21 (×2): qty 250

## 2016-01-21 MED ORDER — ACETAMINOPHEN 325 MG PO TABS: 650.0000 mg | ORAL_TABLET | ORAL | Status: DC | PRN

## 2016-01-21 MED ORDER — NITROGLYCERIN 0.4 MG SL SUBL
0.4000 mg | SUBLINGUAL_TABLET | SUBLINGUAL | Status: DC | PRN
  Administered 2016-01-21: 0.4 mg via SUBLINGUAL
  Filled 2016-01-21 (×2): qty 1

## 2016-01-21 MED ORDER — TRIAMCINOLONE ACETONIDE 0.1 % EX CREA
1.0000 "application " | TOPICAL_CREAM | Freq: Two times a day (BID) | CUTANEOUS | Status: DC | PRN
  Filled 2016-01-21: qty 15

## 2016-01-21 MED ORDER — SODIUM CHLORIDE 0.9 % IV BOLUS (SEPSIS)
1000.0000 mL | Freq: Once | INTRAVENOUS | Status: AC
  Administered 2016-01-21: 1000 mL via INTRAVENOUS

## 2016-01-21 MED ORDER — PANTOPRAZOLE SODIUM 40 MG PO TBEC
40.0000 mg | DELAYED_RELEASE_TABLET | Freq: Every day | ORAL | Status: DC
  Administered 2016-01-21 – 2016-01-25 (×4): 40 mg via ORAL
  Filled 2016-01-21 (×5): qty 1

## 2016-01-21 NOTE — [facility] Notes (Signed)
ANTICOAGULATION CONSULT NOTE - Initial Consult  Pharmacy Consult for  heparin Indication: pulmonary embolus  Allergies  Allergen Reactions  . Tape Rash    Itching, red bumps from  Hypafix  Tape.  . Dilaudid [Hydromorphone Hcl] Itching  . Ivp Dye [Iodinated Diagnostic Agents] Itching  . Percocet [Oxycodone-Acetaminophen] Itching  . Toradol [Ketorolac Tromethamine] Itching    Patient Measurements:   Heparin Dosing Weight: 83 kg  Vital Signs: Temp: 99 F (37.2 C) (05/20 1558) Temp Source: Oral (05/20 1558) BP: 139/97 mmHg (05/20 1641) Pulse Rate: 64 (05/20 1558)  Labs:  Recent Labs  01/21/16 1627  HGB 12.7  HCT 37.8  PLT 416*  LABPROT 13.2  INR 1.02  CREATININE 0.84    Estimated Creatinine Clearance: 104.2 mL/min (by C-G formula based on Cr of 0.84).   Medical History: Past Medical History  Diagnosis Date  . Cancer ([place])   . Migraine   . Asthma   . Breast cancer ([place])   . Arthritis   . Back disorder     degenerative disk disease  . Pap smear for cervical cancer screening 08/12/15  . Mammogram abnormal 08/24/15    first  . GERD (gastroesophageal reflux disease)     Medications:  Scheduled:  . diphenhydrAMINE  50 mg Oral Once  . heparin  4,500 Units Intravenous Once  . predniSONE  50 mg Oral Q6H    Assessment: Pharmacy is consulted to dose heparin in 45 yo female diagnosed with PE. Pt PMH is significant for breast cancer, migraines, asthma, arthritis, DDD of back, and GERD, with a PSHx of cholecystectomy. Pt underwent treatment with Adriamycin/Cytoxan x4, followed by Taxol/carboplatin. Per notes pt finished chemo 2 weeks.   Baseline labs:  Hgb 12.7, plt 416, INR 1.02, PT 13.2, SCr 0.84 mg/dl.   01/21/2016  No PTA anticoagulants noted   Goal of Therapy:  Heparin level 0.3-0.7 units/ml Monitor platelets by anticoagulation protocol: Yes   Plan:  Give 4500 units bolus x 1 Start heparin infusion at 1200 units/hr  Daily heparin level, CBC Drawn  anti-Xa level 6 hours after start Monitor for signs and symptoms of bleeding   Royetta Asal, PharmD, BCPS Pager [phone] 01/21/2016 6:27 PM

## 2016-01-21 NOTE — ED Notes (Addendum)
Pt reports R medial ankle pain worse with palpation that started yesterday. No known injury. Pt also reports CP, SOB, dizziness and nausea that 1200. No significant activity when pain began. Hx of breast ca, last chemo 2 weeks ago.

## 2016-01-21 NOTE — [facility] Notes (Signed)
Patient arrived to floor.  Denies pain.  Room air.  Husband at bedside.  A&O x4.  Ice pack on R ankle from ED.  Heparin drip to be started.

## 2016-01-21 NOTE — H&P (Addendum)
[facility] History and Physical  Cindy Bradshaw [id number] DOB: [birth year] DOA: 01/21/2016   PCP: [name], MD   Patient comes from : home  Chief Complaint: right ankle pain- later found to be having chest pain as well  HPI: Cindy Bradshaw is a 45 y.o. 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 She presents for right ankle pain which began yesterday. She has not had any injury.  Unable to walk due to ankle pain. She suspected it was from chemo as it was pulsating like nerve pain. Pain is severe.   Chest pain starting 1 pm today at rest, pressure like with gradual increase to 9/10.  Is nauseated. No dyspnea, diaphoresis or vomiting. Has a h/o acid reflux but controls it with watching her diet. Has not had reflux over the past few wks. She is mildly tachycardic in the ER. She has been given Nitroglycerine x 1, GI cocktail, Zofran and Morphine and pain is improving.    General: The patient denies fever, weight loss + loss of appetite Cardiac: Denies syncope, palpitations, pedal edema  Respiratory: Denies cough, shortness of breath, wheezing GI: Denies severe indigestion/heartburn, abdominal pain, nausea, vomiting, diarrhea and constipation GU: Denies hematuria, incontinence, dysuria  Musculoskeletal: Denies arthritis  Skin: Denies suspicious skin lesions Neurologic: Denies focal weakness or numbness, change in vision Psychiatry: Denies depression or anxiety. Hematologic: no easy bruising or bleeding  All other systems reviewed and found to be negative.  Past Medical History  Diagnosis Date  . Migraine   . Asthma   . Breast cancer ([place])   . Arthritis   . Back disorder     degenerative disk disease  . GERD (gastroesophageal reflux disease)     Past Surgical History  Procedure Laterality Date  . Cholecystectomy    . Cesarean section    . Portacath placement [name] 09/12/2015    Procedure: INSERTION PORT-A-CATH;   Surgeon: Excell Seltzer, MD;  Location: [facility];  Service: General;  Laterality: [name];    Social History: does not smoke or drink Lives at manages [facility]    Allergies  Allergen Reactions  . Tape Rash    Itching, red bumps from  Hypafix  Tape.  . Dilaudid [Hydromorphone Hcl] Itching  . Ivp Dye [Iodinated Diagnostic Agents] Itching  . Percocet [Oxycodone-Acetaminophen] Itching  . Toradol [Ketorolac Tromethamine] Itching    Family history: Family History  Problem Relation Age of Onset  . Lung cancer Mother 47    2 different types of lung cancer; metastasis to brain; smoker  . Other Mother     hx of hysterectomy for unspecified reason  . Bone cancer Maternal Uncle     dx. early 18s  . Breast cancer Maternal Grandmother     dx. early 53s, s/p mastectomy  . Cancer Paternal Grandfather     unspecified type of cancer, dx. late 26s  . Congestive Heart Failure Father     smoker  . Stroke Father   . Cirrhosis Maternal Grandfather   . Heart Problems Maternal Grandfather   . Lung cancer Other     (maternal great uncle; MGM's brother); had a coal stove  . Cancer Cousin     unspecified type; d. early age (paternal first cousin once-removed)  . Cancer Cousin     dx. as a kid; in remission today; (paternal 2nd cousin)  . Cancer Cousin     unspecified type; d. early 43s; (maternal 1st cousin)  Prior to Admission medications   NONE other than Triamcinolone cream 0.1%  Physical Exam: Filed Vitals:   01/21/16 1640 01/21/16 1641 01/21/16 1732 01/21/16 1800  BP:  139/97 144/89 152/95  Pulse:      Temp:      TempSrc:      Resp: 18  16 24   SpO2: 99%  98% 94%     General: middle aged female laying in bed appears mildly uncomfortable.  HEENT: Normocephalic and Atraumatic, Mucous membranes pink                PERRLA; EOM intact; No scleral icterus,                 Nares: Patent, Oropharynx: Clear, Fair Dentition                 Neck: FROM, no cervical lymphadenopathy,  thyromegaly, carotid bruit or JVD;  Breasts: deferred CHEST WALL: No tenderness  CHEST: Normal respiration, clear to auscultation bilaterally  HEART: Regular rate and rhythm; no murmurs rubs or gallops - HR 100-110 BACK: No kyphosis or scoliosis; no CVA tenderness  GI: Positive Bowel Sounds, soft, non-tender; no masses, no organomegaly Rectal Exam: deferred MSK: No cyanosis, clubbing, or edema- ice pack on right ankle, no edema or skin changes,+ pain on ROM, tender in lower part of calf as well Genitalia: not examined  SKIN:  no rash or ulceration  CNS: Alert and Oriented x 4, Nonfocal exam, CN 2-12 intact  Labs on Admission:  Basic Metabolic Panel:  Recent Labs Lab 01/21/16 1627  NA 142  K 3.6  CL 110  CO2 25  GLUCOSE 102*  BUN 14  CREATININE 0.84  CALCIUM 10.5*   Liver Function Tests: No results for input(s): AST, ALT, ALKPHOS, BILITOT, PROT, ALBUMIN in the last 168 hours. No results for input(s): LIPASE, AMYLASE in the last 168 hours. No results for input(s): AMMONIA in the last 168 hours. CBC:  Recent Labs Lab 01/21/16 1627  WBC 5.4  HGB 12.7  HCT 37.8  MCV 89.6  PLT 416*   Cardiac Enzymes: No results for input(s): CKTOTAL, CKMB, CKMBINDEX, TROPONINI in the last 168 hours.  BNP (last 3 results)  Recent Labs  01/21/16 1627  BNP 10.9    ProBNP (last 3 results) No results for input(s): PROBNP in the last 8760 hours.  CBG: No results for input(s): GLUCAP in the last 168 hours.  Radiological Exams on Admission: Dg Chest 2 View  01/21/2016  CLINICAL DATA:  Chest pain, shortness of breath EXAM: CHEST  2 VIEW COMPARISON:  CTA chest dated 12/28/2015 FINDINGS: Lungs are clear.  No pleural effusion or pneumothorax. The heart is normal in size. Right chest port terminating in the mid SVC. IMPRESSION: No evidence of acute cardiopulmonary disease. Electronically Signed   By: Julian Hy M.D.   On: 01/21/2016 17:28   Dg Ankle Complete Right  01/21/2016   CLINICAL DATA:  Right medial ankle pain EXAM: RIGHT ANKLE - COMPLETE 3+ VIEW COMPARISON:  None. FINDINGS: No fracture or dislocation is seen. The ankle mortise is intact.  Tibiotalar degenerative changes. The base of the fifth metatarsal is unremarkable. Moderate plantar and posterior calcaneal enthesophytes. The visualized soft tissues are unremarkable. IMPRESSION: No fracture or dislocation is seen. Electronically Signed   By: Julian Hy M.D.   On: 01/21/2016 17:27    EKG: Independently reviewed. Sinus tachycardia at 107 bpm  Assessment/Plan Principal Problem:   Chest pain/ tachycardia - in light of  her right ankle pain and the possibility of a DVT, the ER has started a steroid prep for CTA of the chest and started IV Heparin- CT will be done in 13 hrs - will also obtain 3 sets of cardiac enzymes - EKG unrevealing - noted from oncology reveal that she has had prior chest pain which was attributed to musculoskeletal issues- she had a CT of the chest done last month which was negative - add protonix  Active Problems:   Right ankle pain - xray unrevaling- pain and tenderness in back of ankle going up to lower calf - DVT??- unable to give Toradol as she is allergic- will continue Morphine and add Voltaren gel    Breast cancer of upper-outer quadrant of left female breast - finished chemo which resulted in shrinking the tumor from 5.1 to 2.9 cm and is due to have lumpectomy    Consulted:   Code Status: full code  Family Communication: husband  DVT Prophylaxis:heparin infusion  Time spent: 65  [place], MD [facility]  If 7PM-7AM, please contact night-coverage [email or web address] 01/21/2016, 6:25 PM

## 2016-01-21 NOTE — ED Provider Notes (Signed)
CSN: [id number]     Arrival date & time 01/21/16  1552 History   First MD Initiated Contact with Patient 01/21/16 1646     Chief Complaint  Patient presents with  . Ankle Pain  . Chest Pain     (Consider location/radiation/quality/duration/timing/severity/associated sxs/prior Treatment) HPI Comments: Cindy Bradshaw is a 45 y.o. female with a PMHx of breast CA recently completed chemo 2wks ago, migraines, asthma, arthritis, DDD of back, and GERD, with a PSHx of cholecystectomy, who presents to the ED with complaints of sudden onset chest pain that began around noon ~5 hours prior to evaluation while she was sitting outside at a park. She describes the pain is 7/10 constant pressure-like pain in the anterior aspect of her chest, nonradiating, worse with deep breathing, and with no treatments tried prior to arrival. Associated symptoms include shortness of breath, lightheadedness, diaphoresis, and nausea. She has also reported increasing orthopnea now requiring 4 kg over the last several days. Additionally she states that over the last 1 day she has developed right ankle pain with no injury reported. She feels that her right ankle is swollen, but denies any erythema or warmth.  She denies any fevers, chills, cough, hemoptysis, calf swelling, claudication, recent travel/surgery/immobilization, estrogen use, history of DVT/PE, abdominal pain, vomiting or diarrhea, constipation, dysuria, hematuria, numbness, tingling, or focal weakness. She is a nonsmoker and has no family history of MI or CAD. She has had no recent surgeries or head injury, no recent GI bleeding, and no other bleeding sources. Her PCP is Shamleffer, Herschell Dimes, MD at [facility]. Her oncologist is Dr. Lindi Adie.   She states she is allergic to contrast dye, but usually gets a 13hr prep with prednisone and benadryl and does fine. She had a CTA Chest on 12/28/15 after this prep and did well. That CTA was negative.   Patient is a 45 y.o. female  presenting with ankle pain and chest pain. The history is provided by the patient and medical records. No language interpreter was used.  Ankle Pain Location:  Ankle Time since incident:  1 day Injury: no   Ankle location:  R ankle Associated symptoms: no fever   Chest Pain Pain location:  Substernal area Pain quality: pressure   Pain radiates to:  Does not radiate Pain radiates to the back: no   Pain severity:  Moderate Onset quality:  Sudden Duration:  5 hours Timing:  Constant Progression:  Unchanged Chronicity:  New Context: at rest   Relieved by:  None tried Worsened by:  Deep breathing Ineffective treatments:  None tried Associated symptoms: diaphoresis, nausea, orthopnea and shortness of breath   Associated symptoms: no abdominal pain, no claudication, no cough, no fever, no lower extremity edema, no numbness, not vomiting and no weakness   Risk factors: obesity   Risk factors: no birth control, no coronary artery disease, no diabetes mellitus, no high cholesterol, no hypertension, no immobilization, no prior DVT/PE, no smoking and no surgery   Risk factors comment:  +breast cancer   Past Medical History  Diagnosis Date  . Cancer ([place])   . Migraine   . Asthma   . Breast cancer ([place])   . Arthritis   . Back disorder     degenerative disk disease  . Pap smear for cervical cancer screening 08/12/15  . Mammogram abnormal 08/24/15    first  . GERD (gastroesophageal reflux disease)    Past Surgical History  Procedure Laterality Date  . Cholecystectomy    .  Cesarean section    . Portacath placement [name] 09/12/2015    Procedure: INSERTION PORT-A-CATH;  Surgeon: Excell Seltzer, MD;  Location: [facility];  Service: General;  Laterality: [name];   Family History  Problem Relation Age of Onset  . Lung cancer Mother 88    2 different types of lung cancer; metastasis to brain; smoker  . Other Mother     hx of hysterectomy for unspecified reason  . Bone cancer Maternal Uncle       dx. early 39s  . Breast cancer Maternal Grandmother     dx. early 16s, s/p mastectomy  . Cancer Paternal Grandfather     unspecified type of cancer, dx. late 27s  . Congestive Heart Failure Father     smoker  . Stroke Father   . Cirrhosis Maternal Grandfather   . Heart Problems Maternal Grandfather   . Lung cancer Other     (maternal great uncle; MGM's brother); had a coal stove  . Cancer Cousin     unspecified type; d. early age (paternal first cousin once-removed)  . Cancer Cousin     dx. as a kid; in remission today; (paternal 2nd cousin)  . Cancer Cousin     unspecified type; d. early 30s; (maternal 1st cousin)   Social History  Substance Use Topics  . Smoking status: Never Smoker   . Smokeless tobacco: Never Used  . Alcohol Use: 0.0 oz/week    0 Standard drinks or equivalent per week     Comment: occassional glass of wine   OB History    Gravida Para Term Preterm AB TAB SAB Ectopic Multiple Living   0 0 0 0 0 0 0 0       Review of Systems  Constitutional: Positive for diaphoresis. Negative for fever and chills.  Respiratory: Positive for shortness of breath. Negative for cough and wheezing.   Cardiovascular: Positive for chest pain and orthopnea. Negative for claudication and leg swelling.  Gastrointestinal: Positive for nausea. Negative for vomiting, abdominal pain, diarrhea, constipation and blood in stool.  Genitourinary: Negative for dysuria and hematuria.  Musculoskeletal: Positive for joint swelling (R ankle) and arthralgias (R ankle). Negative for myalgias.  Skin: Negative for color change.  Allergic/Immunologic: Positive for immunocompromised state (breast CA just finished chemo).  Neurological: Positive for light-headedness. Negative for weakness and numbness.  Hematological: Does not bruise/bleed easily.  Psychiatric/Behavioral: Negative for confusion.   10 Systems reviewed and are negative for acute change except as noted in the HPI.    Allergies   Tape; Dilaudid; Ivp dye; Percocet; and Toradol  Home Medications   Prior to Admission medications   Medication Sig Start Date End Date Taking? Authorizing Provider  acetaminophen (TYLENOL) 500 MG tablet Take 1,000 mg by mouth every 6 (six) hours as needed for mild pain, moderate pain or headache.   Yes Historical Provider, MD  lidocaine-prilocaine (EMLA) cream Apply to affected area as directed. 09/07/15  Yes Historical Provider, MD  PROAIR HFA 108 (90 BASE) MCG/ACT inhaler Inhale 2 puffs into the lungs every 4 (four) hours as needed. Reported on 12/13/2015 03/28/15  Yes Historical Provider, MD  triamcinolone cream (KENALOG) 0.1 % Apply 1 application topically 2 (two) times daily. 12/13/15  Yes Laurie Panda, NP  ciprofloxacin (CIPRO) 500 MG tablet Take 1 tablet (500 mg total) by mouth 2 (two) times daily. Patient not taking: Reported on 01/21/2016 01/04/16   Susanne Borders, NP  diphenoxylate-atropine (LOMOTIL) 2.5-0.025 MG tablet Take 2 tablets by  mouth 4 (four) times daily as needed for diarrhea or loose stools. Patient not taking: Reported on 12/13/2015 11/17/15   Susanne Borders, NP  Hydrocodone-Acetaminophen 5-300 MG TABS Take 1 tablet by mouth every 8 (eight) hours as needed (Chest and rib pain). Patient not taking: Reported on 01/21/2016 12/27/15   Nicholas Lose, MD  LORazepam (ATIVAN) 0.5 MG tablet Take 1 tablet (0.5 mg total) by mouth at bedtime. Patient not taking: Reported on 01/21/2016 12/13/15   Laurie Panda, NP  ondansetron (ZOFRAN) 8 MG tablet Take 1 tablet (8 mg total) by mouth every 8 (eight) hours as needed for nausea or vomiting. Patient not taking: Reported on 01/21/2016 12/13/15   Laurie Panda, NP  predniSONE (DELTASONE) 50 MG tablet Take 1 tablet (50 mg total) by mouth as directed. Take 50mg  at 2am, 9am, and 2pm. Patient not taking: Reported on 01/21/2016 01/10/16   Nicholas Lose, MD  prochlorperazine (COMPAZINE) 10 MG tablet Take 1 tablet (10 mg total) by mouth every 6 (six)  hours as needed for nausea or vomiting. Patient not taking: Reported on 01/21/2016 12/13/15   Laurie Panda, NP  traMADol (ULTRAM) 50 MG tablet Take 1 tablet (50 mg total) by mouth every 6 (six) hours as needed for moderate pain or severe pain. Patient not taking: Reported on 01/21/2016 12/12/15   Eugenie Filler, MD   BP 141/90 mmHg  Pulse 64  Temp(Src) 99 F (37.2 C) (Oral)  Resp 18  SpO2 99% Physical Exam  Constitutional: She is oriented to person, place, and time. Vital signs are normal. She appears well-developed and well-nourished.  Non-toxic appearance. No distress.  Afebrile, nontoxic, appears slightly anxious but in NAD  HENT:  Head: Normocephalic and atraumatic.  Mouth/Throat: Oropharynx is clear and moist and mucous membranes are normal.  Eyes: Conjunctivae and EOM are normal. Right eye exhibits no discharge. Left eye exhibits no discharge.  Neck: Normal range of motion. Neck supple.  Cardiovascular: Regular rhythm, normal heart sounds and intact distal pulses.  Tachycardia present.  Exam reveals no gallop and no friction rub.   No murmur heard. During exam, HR 99-112, reg rhythm, nl s1/s2, no m/r/g, distal pulses intact, no pedal edema  Pulmonary/Chest: Effort normal and breath sounds normal. No respiratory distress. She has no decreased breath sounds. She has no wheezes. She has no rhonchi. She has no rales. She exhibits tenderness. She exhibits no crepitus, no deformity and no retraction.    CTAB in all lung fields, no w/r/r, no hypoxia or increased WOB, speaking in full sentences, SpO2 99% on RA  Chest wall with moderate TTP anteriorly and across the L anterior side, without crepitus, deformities, or retractions   Abdominal: Soft. Normal appearance and bowel sounds are normal. She exhibits no distension. There is no tenderness. There is no rigidity, no rebound, no guarding, no CVA tenderness, no tenderness at McBurney's point and negative Murphy's sign.  Musculoskeletal:  Normal range of motion.       Right ankle: She exhibits normal range of motion, no swelling, no ecchymosis, no deformity and normal pulse. Tenderness. Medial malleolus tenderness found. Achilles tendon normal.  R ankle with FROM intact, no swelling or deformity, with mild TTP of medial malleolus but no TTP or swelling of fore foot. Some tenderness into the calf, no edema noted. No break in skin. No bruising or erythema. No warmth. Achilles intact. Good pedal pulse and cap refill of all toes. Wiggling toes without difficulty. Sensation grossly intact. Soft compartments  Neurological: She is alert and oriented to person, place, and time. She has normal strength. No sensory deficit.  Skin: Skin is warm, dry and intact. No rash noted.  Psychiatric: She has a normal mood and affect.  Nursing note and vitals reviewed.   ED Course  Procedures (including critical care time) Labs Review Labs Reviewed  BASIC METABOLIC PANEL - Abnormal; Notable for the following:    Glucose, Bld 102 (*)    Calcium 10.5 (*)    All other components within normal limits  CBC - Abnormal; Notable for the following:    Platelets 416 (*)    All other components within normal limits  PROTIME-INR  BRAIN NATRIURETIC PEPTIDE  I-STAT TROPOININ, ED    Imaging Review Dg Chest 2 View  01/21/2016  CLINICAL DATA:  Chest pain, shortness of breath EXAM: CHEST  2 VIEW COMPARISON:  CTA chest dated 12/28/2015 FINDINGS: Lungs are clear.  No pleural effusion or pneumothorax. The heart is normal in size. Right chest port terminating in the mid SVC. IMPRESSION: No evidence of acute cardiopulmonary disease. Electronically Signed   By: Julian Hy M.D.   On: 01/21/2016 17:28   Dg Ankle Complete Right  01/21/2016  CLINICAL DATA:  Right medial ankle pain EXAM: RIGHT ANKLE - COMPLETE 3+ VIEW COMPARISON:  None. FINDINGS: No fracture or dislocation is seen. The ankle mortise is intact.  Tibiotalar degenerative changes. The base of the  fifth metatarsal is unremarkable. Moderate plantar and posterior calcaneal enthesophytes. The visualized soft tissues are unremarkable. IMPRESSION: No fracture or dislocation is seen. Electronically Signed   By: Julian Hy M.D.   On: 01/21/2016 17:27   I have personally reviewed and evaluated these images and lab results as part of my medical decision-making.   EKG Interpretation   Date/Time:  Saturday Jan 21 2016 17:16:54 EDT Ventricular Rate:  107 PR Interval:  169 QRS Duration: 87 QT Interval:  338 QTC Calculation: 451 R Axis:   20 Text Interpretation:  Sinus tachycardia Low voltage, precordial leads Rate  increased compared to previous ekgs before today Confirmed by Lonia Skinner ([id number]) on 01/21/2016 5:46:47 PM      MDM   Final diagnoses:  Chest pain, unspecified chest pain type  SOB (shortness of breath)  Other pulmonary embolism without acute cor pulmonale, unspecified chronicity (HCC)  Right ankle pain  Thrombocytosis (HCC)  Allergy to contrast media (used for diagnostic x-rays)  Nausea  Diaphoresis  Lightheadedness  Breast cancer of upper-outer quadrant of left female breast ([place])    45 y.o. female here with R ankle pain x1 day, and sudden onset CP/SOB/lightheadedness/nausea that began 5hrs ago. On exam, mildly tachycardic during exam (99-112), no hypoxia, no LE swelling but +Homan's sign on R leg, diffuse ankle tenderness without bruising or skin changes, chest wall TTP, appears anxious, extremities NVI with soft compartments. She has breast cancer, finished chemo 2wks ago and is scheduled for surgery coming up soon. EKG not done (or not crossing over?), will ensure this is done. Xray R ankle and CXR pending. Trop neg and CBC with plt 416 but otherwise WNL. My biggest concern is for PE/ACS, I wonder whether she has a DVT in the R leg causing the symptoms; unfortunately the pt is allergic to contrast dye, can get it but needs 13hr prep of prednisone and benadryl;  for now will proceed with dimer and DVT study, if these are neg we could potentially be less concerned for PE. But we could also proceed  with heparin and admit for overnight prep and CTA in the morning-- doubt we could get V/Q scan done today but will try to reach out and see if this is possible. In the meantime, will give fluids, zofran, ASA, NTG, GI cocktail, and morphine. Will also add on BNP. Will discuss case with my attending Dr. Alfonse Spruce and reassess shortly  5:26 PM Dr. Alfonse Spruce my attending agrees that the biggest risk is for PE given her presentation, and that best next step would be to cancel dimer, proceed with heparinizing pt and admit for overnight prep. Will reach out to radiology to ensure that this CTA would be able to be done tomorrow after the appropriate prep. Will still get BNP and DVT study. CXR and ankle xray still pending, but BMP returning and WNL. Will await imaging/BNP to return prior to proceeding with admission. She denies any recent surgeries, head injuries, or bleeding issues. Of note, EKG showing tachycardia but without acute ischemic findings.  5:40 PM Radiology reporting that it would be fine to have this prep and CTA in 13hrs. Will order this now. CXR neg, R ankle xray neg. BNP pending. Heparin orders placed by pharmacy consult, appreciate this assistance. Will proceed with admission at this time. DVT study not yet done.   5:55 PM Dr. Wynelle Cleveland returning page, will admit. Please see her notes for further documentation of care. Pt stable at this time.  BP 139/97 mmHg  Pulse 64  Temp(Src) 99 F (37.2 C) (Oral)  Resp 18  SpO2 99%  Meds ordered this encounter  Medications  . aspirin chewable tablet 324 mg    Sig:   . nitroGLYCERIN (NITROSTAT) SL tablet 0.4 mg    Sig:   . morphine 4 MG/ML injection 4 mg    Sig:   . gi cocktail (Maalox,Lidocaine,Donnatal)    Sig:   . 0.9 %  sodium chloride infusion    Sig:   . ondansetron (ZOFRAN) injection 4 mg    Sig:   .  heparin bolus via infusion 4,500 Units    Sig:   . heparin ADULT infusion 100 units/mL (25000 units/250 mL)    Sig:   . predniSONE (DELTASONE) tablet 50 mg    Sig:   . diphenhydrAMINE (BENADRYL) capsule 50 mg    Sig:      Javone Ybanez Camprubi-Soms, PA-C 01/21/16 1755  Harvel Quale, MD 01/29/16 1558

## 2016-01-21 NOTE — ED Notes (Signed)
Below order not completed by EW. 

## 2016-01-21 NOTE — ED Notes (Signed)
Attempted to call report. Floor nurse will call back

## 2016-01-22 ENCOUNTER — Observation Stay (HOSPITAL_COMMUNITY): Payer: Federal, State, Local not specified - PPO

## 2016-01-22 ENCOUNTER — Encounter (HOSPITAL_COMMUNITY): Payer: Self-pay

## 2016-01-22 ENCOUNTER — Observation Stay (HOSPITAL_BASED_OUTPATIENT_CLINIC_OR_DEPARTMENT_OTHER): Payer: Federal, State, Local not specified - PPO

## 2016-01-22 DIAGNOSIS — I200 Unstable angina: Secondary | ICD-10-CM | POA: Diagnosis not present

## 2016-01-22 DIAGNOSIS — R52 Pain, unspecified: Secondary | ICD-10-CM | POA: Diagnosis not present

## 2016-01-22 DIAGNOSIS — Z853 Personal history of malignant neoplasm of breast: Secondary | ICD-10-CM | POA: Diagnosis not present

## 2016-01-22 DIAGNOSIS — C50412 Malignant neoplasm of upper-outer quadrant of left female breast: Secondary | ICD-10-CM | POA: Diagnosis not present

## 2016-01-22 DIAGNOSIS — M25571 Pain in right ankle and joints of right foot: Secondary | ICD-10-CM | POA: Diagnosis not present

## 2016-01-22 DIAGNOSIS — R0789 Other chest pain: Secondary | ICD-10-CM

## 2016-01-22 DIAGNOSIS — Z9221 Personal history of antineoplastic chemotherapy: Secondary | ICD-10-CM | POA: Diagnosis not present

## 2016-01-22 DIAGNOSIS — R079 Chest pain, unspecified: Secondary | ICD-10-CM | POA: Diagnosis not present

## 2016-01-22 LAB — CBC
HCT: 35.9 % — ABNORMAL LOW (ref 36.0–46.0)
Hemoglobin: 11.9 g/dL — ABNORMAL LOW (ref 12.0–15.0)
MCH: 30 pg (ref 26.0–34.0)
MCHC: 33.1 g/dL (ref 30.0–36.0)
MCV: 90.4 fL (ref 78.0–100.0)
PLATELETS: 384 10*3/uL (ref 150–400)
RBC: 3.97 MIL/uL (ref 3.87–5.11)
RDW: 15.2 % (ref 11.5–15.5)
WBC: 5.8 10*3/uL (ref 4.0–10.5)

## 2016-01-22 LAB — TROPONIN I
TROPONIN I: 0.04 ng/mL — AB (ref <0.031)
Troponin I: 0.03 ng/mL (ref <0.031)

## 2016-01-22 LAB — HEPARIN LEVEL (UNFRACTIONATED)
HEPARIN UNFRACTIONATED: 0.35 [IU]/mL (ref 0.30–0.70)
Heparin Unfractionated: 0.47 IU/mL (ref 0.30–0.70)

## 2016-01-22 MED ORDER — IOPAMIDOL (ISOVUE-370) INJECTION 76%
100.0000 mL | Freq: Once | INTRAVENOUS | Status: AC | PRN
  Administered 2016-01-22: 100 mL via INTRAVENOUS

## 2016-01-22 MED ORDER — DIPHENHYDRAMINE HCL 50 MG/ML IJ SOLN
50.0000 mg | Freq: Once | INTRAMUSCULAR | Status: AC
  Administered 2016-01-22: 50 mg via INTRAVENOUS
  Filled 2016-01-22: qty 1

## 2016-01-22 MED ORDER — REGADENOSON 0.4 MG/5ML IV SOLN
0.4000 mg | Freq: Once | INTRAVENOUS | Status: DC
  Filled 2016-01-22: qty 5

## 2016-01-22 MED ORDER — SODIUM CHLORIDE 0.9% FLUSH
10.0000 mL | INTRAVENOUS | Status: DC | PRN
Start: 2016-01-22 — End: 2016-01-25
  Administered 2016-01-22 – 2016-01-24 (×4): 10 mL
  Filled 2016-01-22 (×4): qty 40

## 2016-01-22 MED ORDER — DIPHENHYDRAMINE HCL 50 MG/ML IJ SOLN: 50.0000 mg | Freq: Once | INTRAMUSCULAR | Status: DC

## 2016-01-22 MED ORDER — DIPHENHYDRAMINE HCL 50 MG PO CAPS
50.0000 mg | ORAL_CAPSULE | Freq: Four times a day (QID) | ORAL | Status: DC | PRN
  Administered 2016-01-22 – 2016-01-24 (×3): 50 mg via ORAL
  Filled 2016-01-22 (×3): qty 1

## 2016-01-22 MED ORDER — GI COCKTAIL ~~LOC~~
30.0000 mL | Freq: Once | ORAL | Status: AC
  Administered 2016-01-22: 30 mL via ORAL
  Filled 2016-01-22: qty 30

## 2016-01-22 MED ORDER — HEPARIN SODIUM (PORCINE) 5000 UNIT/ML IJ SOLN
5000.0000 [IU] | Freq: Three times a day (TID) | INTRAMUSCULAR | Status: DC
  Administered 2016-01-22 – 2016-01-24 (×7): 5000 [IU] via SUBCUTANEOUS
  Filled 2016-01-22 (×9): qty 1

## 2016-01-22 NOTE — Progress Notes (Signed)
PHARMACY - HEPARIN (brief note)  Patient on IV heparin for +PE  IV heparin infusing @ 1200 units/hr  Heparin level = 0.35 (Goal 0000000)  No complications of therapy noted  PLAN:  Continue heparin @ 1200 units/hr              Recheck heparin level in 6 hr to ensure therapeutic dose  Leone Haven, PharmD 01/22/16 @ 01:32

## 2016-01-22 NOTE — Progress Notes (Signed)
ANTICOAGULATION CONSULT NOTE - Follow-Up Pharmacy Consult for  heparin Indication: pulmonary embolus  Allergies  Allergen Reactions  . Tape Rash    Itching, red bumps from  Hypafix  Tape.  . Dilaudid [Hydromorphone Hcl] Itching  . Ivp Dye [Iodinated Diagnostic Agents] Itching  . Percocet [Oxycodone-Acetaminophen] Itching  . Toradol [Ketorolac Tromethamine] Itching    Patient Measurements:   Heparin Dosing Weight: 83 kg  Vital Signs: Temp: 98 F (36.7 C) (05/21 0515) Temp Source: Oral (05/21 0515) BP: 115/78 mmHg (05/21 0515) Pulse Rate: 83 (05/21 0515)  Labs:  Recent Labs  01/21/16 1627 01/21/16 1903 01/22/16 0026 01/22/16 0637 01/22/16 0730  HGB 12.7  --   --  11.9*  --   HCT 37.8  --   --  35.9*  --   PLT 416*  --   --  384  --   LABPROT 13.2  --   --   --   --   INR 1.02  --   --   --   --   HEPARINUNFRC  --   --  0.35  --  0.47  CREATININE 0.84  --   --   --   --   TROPONINI  --  0.04* 0.04* 0.03  --     Estimated Creatinine Clearance: 104.2 mL/min (by C-G formula based on Cr of 0.84).   Medical History: Past Medical History  Diagnosis Date  . Cancer ([place])   . Migraine   . Asthma   . Breast cancer ([place])   . Arthritis   . Back disorder     degenerative disk disease  . Pap smear for cervical cancer screening 08/12/15  . Mammogram abnormal 08/24/15    first  . GERD (gastroesophageal reflux disease)     Medications:  Scheduled:  . aspirin EC  81 mg Oral Daily  . diclofenac sodium  2 g Topical QID  . pantoprazole  40 mg Oral Daily    Assessment: Pharmacy is consulted to dose heparin in 45 yo female diagnosed with PE. Pt PMH is significant for breast cancer, migraines, asthma, arthritis, DDD of back, and GERD, with a PSHx of cholecystectomy. Pt underwent treatment with Adriamycin/Cytoxan x4, followed by Taxol/carboplatin. Per notes pt finished chemo 2 weeks.   Baseline labs:  Hgb 12.7, plt 416, INR 1.02, PT 13.2, SCr 0.84 mg/dl.    01/22/2016  Slight drop in hgb, now at 11.9, plt stable and WNL  Heparin level is 0.47, within the target range  No bleeding notes per RN   Goal of Therapy:  Heparin level 0.3-0.7 units/ml Monitor platelets by anticoagulation protocol: Yes   Plan:  Continue heparin at 1200 units/hr Daily heparin level, CBC Monitor for signs and symptoms of bleeding   Royetta Asal, PharmD, BCPS Pager [phone] 01/22/2016 9:07 AM

## 2016-01-22 NOTE — Progress Notes (Addendum)
Found pt in room weak and short of breath, after she reports, getting up to go to the bathroom. Educated pt about the safety measure and the importantace  of calling for help when getting up. Pt complain of chest pain and difficulty  Breathing, notified the RRT . Give nitro for chest pain of 9/10, give Zofran for nausea, On call notified and call to Floor.

## 2016-01-22 NOTE — Progress Notes (Signed)
PROGRESS NOTE    Cindy Bradshaw  [id number] DOB: Aug 01, 1971 DOA: 01/21/2016  PCP: [name], MD   Brief Narrative:  Cindy Bradshaw. Cindy Bradshaw is a 45 y.o. 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 She presents for right ankle pain which began yesterday. She has not had any injury. Unable to walk due to ankle pain. She suspected it was from chemo as it was pulsating like nerve pain. Pain is severe.  Chest pain starting 1 pm today at rest, pressure like with gradual increase to 9/10. Is nauseated. No dyspnea, diaphoresis or vomiting. Has a h/o acid reflux but controls it with watching her diet. Has not had reflux over the past few wks. She is mildly tachycardic in the ER. She has been given Nitroglycerine x 1, GI cocktail, Zofran and Morphine and pain is improving.    Subjective: Had chest pain last night resolving with Nitro. No reflux. Pain in right ankle continues and is going up her calf.   Assessment & Plan:   Principal Problem:   Chest pain - CTA neg for PE - will ask for cards eval- Troponin neg- EKG unrevealing  Active Problems: Allergic reaction - severe itching from CT dye although she was given a prep- Benadryl    Right ankle pain - - xray unrevaling- pain and tenderness in back of ankle going up to lower calf - venous duplex>>no DVT - Voltaren gel/ kpad  Breast cancer of upper-outer quadrant of left female breast - finished chemo which resulted in shrinking the tumor from 5.1 to 2.9 cm and is due to have lumpectomy   DVT prophylaxis: Heparin Code Status: full code Family Communication: husband Disposition Plan: home tomorrow Consultants:   cardiology Procedures:   Venous duplex right leg Antimicrobials:  Anti-infectives    None       Objective: Filed Vitals:   01/21/16 1732 01/21/16 1800 01/21/16 1855 01/22/16 0515  BP: 144/89 152/95 126/80 115/78  Pulse:   101 83  Temp:   98.7 F (37.1 C) 98 F  (36.7 C)  TempSrc:   Oral Oral  Resp: 16 24 20 18   SpO2: 98% 94% 98% 98%    Intake/Output Summary (Last 24 hours) at 01/22/16 1106 Last data filed at 01/21/16 2100  Gross per 24 hour  Intake      0 ml  Output      1 ml  Net     -1 ml   There were no vitals filed for this visit.  Examination: General exam: Appears comfortable  HEENT: PERRLA, oral mucosa moist, no sclera icterus or thrush Respiratory system: Clear to auscultation. Respiratory effort normal. Cardiovascular system: S1 & S2 heard, RRR.  No murmurs  Gastrointestinal system: Abdomen soft, non-tender, nondistended. Normal bowel sound. No organomegaly Central nervous system: Alert and oriented. No focal neurological deficits. Extremities: No cyanosis, clubbing or edema- tender in right ankle and lower calf with pain on ROM Skin: No rashes or ulcers Psychiatry:  Mood & affect appropriate.     Data Reviewed: I have personally reviewed following labs and imaging studies  CBC:  Recent Labs Lab 01/21/16 1627 01/22/16 0637  WBC 5.4 5.8  HGB 12.7 11.9*  HCT 37.8 35.9*  MCV 89.6 90.4  PLT 416* 0000000   Basic Metabolic Panel:  Recent Labs Lab 01/21/16 1627  NA 142  K 3.6  CL 110  CO2 25  GLUCOSE 102*  BUN 14  CREATININE 0.84  CALCIUM 10.5*  GFR: Estimated Creatinine Clearance: 104.2 mL/min (by C-G formula based on Cr of 0.84). Liver Function Tests: No results for input(s): AST, ALT, ALKPHOS, BILITOT, PROT, ALBUMIN in the last 168 hours. No results for input(s): LIPASE, AMYLASE in the last 168 hours. No results for input(s): AMMONIA in the last 168 hours. Coagulation Profile:  Recent Labs Lab 01/21/16 1627  INR 1.02   Cardiac Enzymes:  Recent Labs Lab 01/21/16 1903 01/22/16 0026 01/22/16 0637  TROPONINI 0.04* 0.04* 0.03   BNP (last 3 results) No results for input(s): PROBNP in the last 8760 hours. HbA1C: No results for input(s): HGBA1C in the last 72 hours. CBG: No results for input(s):  GLUCAP in the last 168 hours. Lipid Profile: No results for input(s): CHOL, HDL, LDLCALC, TRIG, CHOLHDL, LDLDIRECT in the last 72 hours. Thyroid Function Tests: No results for input(s): TSH, T4TOTAL, FREET4, T3FREE, THYROIDAB in the last 72 hours. Anemia Panel: No results for input(s): VITAMINB12, FOLATE, FERRITIN, TIBC, IRON, RETICCTPCT in the last 72 hours. Urine analysis:    Component Value Date/Time   COLORURINE YELLOW 01/23/2015 0930   APPEARANCEUR CLEAR 01/23/2015 0930   LABSPEC 1.020 01/04/2016 1512   LABSPEC 1.010 01/23/2015 0930   PHURINE 6.0 01/04/2016 1512   PHURINE 5.5 01/23/2015 0930   GLUCOSEU Negative 01/04/2016 1512   GLUCOSEU NEGATIVE 01/23/2015 0930   HGBUR Moderate 01/04/2016 1512   HGBUR SMALL* 01/23/2015 0930   BILIRUBINUR Negative 01/04/2016 1512   BILIRUBINUR NEGATIVE 01/23/2015 0930   KETONESUR Negative 01/04/2016 1512   KETONESUR NEGATIVE 01/23/2015 0930   PROTEINUR < 30 01/04/2016 1512   PROTEINUR NEGATIVE 01/23/2015 0930   UROBILINOGEN 0.2 01/04/2016 1512   UROBILINOGEN 0.2 01/23/2015 0930   NITRITE Negative 01/04/2016 1512   NITRITE NEGATIVE 01/23/2015 0930   LEUKOCYTESUR Negative 01/04/2016 1512   LEUKOCYTESUR NEGATIVE 01/23/2015 0930   Sepsis Labs: @LABRCNTIP (procalcitonin:4,lacticidven:4) )No results found for this or any previous visit (from the past 240 hour(s)).       Radiology Studies: Dg Chest 2 View  01/21/2016  CLINICAL DATA:  Chest pain, shortness of breath EXAM: CHEST  2 VIEW COMPARISON:  CTA chest dated 12/28/2015 FINDINGS: Lungs are clear.  No pleural effusion or pneumothorax. The heart is normal in size. Right chest port terminating in the mid SVC. IMPRESSION: No evidence of acute cardiopulmonary disease. Electronically Signed   By: Julian Hy M.D.   On: 01/21/2016 17:28   Dg Ankle Complete Right  01/21/2016  CLINICAL DATA:  Right medial ankle pain EXAM: RIGHT ANKLE - COMPLETE 3+ VIEW COMPARISON:  None. FINDINGS: No  fracture or dislocation is seen. The ankle mortise is intact.  Tibiotalar degenerative changes. The base of the fifth metatarsal is unremarkable. Moderate plantar and posterior calcaneal enthesophytes. The visualized soft tissues are unremarkable. IMPRESSION: No fracture or dislocation is seen. Electronically Signed   By: Julian Hy M.D.   On: 01/21/2016 17:27   Ct Angio Chest Pe W/cm &/or Wo Cm  01/22/2016  CLINICAL DATA:  Centralized chest pain shortness of breath. Evaluate for pulmonary embolism. EXAM: CT ANGIOGRAPHY CHEST WITH CONTRAST TECHNIQUE: Multidetector CT imaging of the chest was performed using the standard protocol during bolus administration of intravenous contrast. Multiplanar CT image reconstructions and MIPs were obtained to evaluate the vascular anatomy. CONTRAST:  100 cc Isovue 370 (patient received 13 hour premedication steroid prep and tolerated intravenous contrast without incident) COMPARISON:  Chest CT - 12/28/2015; chest radiograph - 01/21/2016; 09/16/2015 FINDINGS: Vascular Findings: There is adequate opacification of the pulmonary  arterial system with the main pulmonary artery measuring 253 Hounsfield units. There are no discrete filling defects within the pulmonary arterial tree to the level of the bilateral subsegmental pulmonary arteries. Evaluation of the distal subsegmental pulmonary arteries is degraded secondary to a combination of suboptimal vessel opacification, patient body habitus and respiratory artifact. Normal caliber the main pulmonary artery. Cardiomegaly. No pericardial effusion. Normal caliber the thoracic aorta. No definite thoracic aortic dissection on this nongated examination. Bovine configuration of the aortic arch. The left vertebral artery is incidentally noted to arise directly from the aortic arch. Review of the MIP images confirms the above findings. ---------------------------------------------------------------------------------- Nonvascular  Findings: Evaluation of the pulmonary parenchyma is degraded secondary to a combination of patient body habitus and respiratory artifact. Mediastinum/Lymph Nodes: No mediastinal, hilar axillary lymphadenopathy. Lungs/Pleura: Bibasilar dependent consolidative subsegmental atelectasis. Minimal dependent subpleural ground-glass atelectasis. No discrete focal airspace opacities. No pleural effusion or pneumothorax. The central pulmonary airways appear widely patent. No discrete pulmonary nodules given limitation of the examination. Upper abdomen: Limited early arterial phase evaluation of the upper abdomen demonstrates sequela mild dilatation of the common bile duct and centralized intrahepatic biliary duct dilatation, likely the sequela post cholecystectomy state and biliary reservoir phenomenon. Musculoskeletal: No acute or aggressive osseous abnormalities. Stigmata of DISH throughout the thoracic spine. Grossly unchanged approximately 2.3 x 1.1 cm hypo attenuating nodule within the inferior lateral aspect the left lobe of the thyroid (image 7, series 13). IMPRESSION: 1. Bibasilar atelectasis without acute cardiopulmonary disease. Specifically, no evidence of pulmonary embolism to the level of the bilateral subsegmental pulmonary arteries. 2. Cardiomegaly. 3. Grossly unchanged suspected approximately 2.3 cm hypo attenuating nodule within the inferior aspect the left lobe of the thyroid. Further evaluation with dedicated nonemergent thyroid ultrasound could be performed as indicated. Electronically Signed   By: Sandi Mariscal M.D.   On: 01/22/2016 10:08      Scheduled Meds: . aspirin EC  81 mg Oral Daily  . diclofenac sodium  2 g Topical QID  . pantoprazole  40 mg Oral Daily   Continuous Infusions: . heparin 1,200 Units/hr (01/21/16 1855)     LOS: 1 day    Time spent in minutes: 59    [place], MD [facility] Pager: [email or web address] Password [email or web address] 01/22/2016, 11:06 AM

## 2016-01-22 NOTE — Progress Notes (Signed)
RRT called d/t pt c/o CP with N/V.  Pt rated chest pain 9/10 SL nitro given x 1. Zofran iv given x 1.  Pt previously given morphine iv as well.  EKG done.  [facility] called to bedside to evalute.  Pt 116/84 bp, sats 98% on 2l,. Pt feeling better after receiving medications. Rates pain 4/10.  RN instructed to call if there are anymore concerns.

## 2016-01-22 NOTE — Progress Notes (Signed)
VASCULAR LAB PRELIMINARY  PRELIMINARY  PRELIMINARY  PRELIMINARY  Right lower extremity venous duplex completed.    Right:  No evidence of DVT, superficial thrombosis, or Baker's cyst.  Gave results to patient's nurse @10 "05 am.  Janifer Adie, RVT, RDMS 01/22/2016, 10:15 AM

## 2016-01-22 NOTE — Progress Notes (Signed)
Patient ID: Cindy Bradshaw, female   DOB: 1970-11-20, 45 y.o.   MRN: [id number] Pt complained of chest pain at 11:55p.  Pt complained of chest pain and pain in her right leg.  Pt given nitro and had relief of pain.  Pt also complains of pain in her right leg, pain in her calf that goes up back of leg.    Vitals stable, EKG no acute, troponin 0.04 x 2.  Doppler of right lower extremity ordered for am.

## 2016-01-22 NOTE — Consult Note (Signed)
CARDIOLOGY CONSULT NOTE       Patient ID: Cindy Bradshaw MRN: [id number] DOB/AGE: February 13, 1971 45 y.o.  Admit date: 01/21/2016 Referring Physician:  Wynelle Cleveland Primary Physician: [name], MD Primary Cardiologist:  New Reason for Consultation:  Chest Pain  Principal Problem:   Chest pain Active Problems:   Breast cancer of upper-outer quadrant of left female breast (HCC)   Normocytic normochromic anemia   Chemotherapy-induced peripheral neuropathy (HCC)   Right ankle pain   HPI:   45 y.o. admitted with chest pain and right leg pain .   Chest pain starting 1 pm today at rest, pressure like with gradual increase to 9/10. Is nauseated. No dyspnea, diaphoresis or vomiting. Has a h/o acid reflux but controls it with watching her diet. Has not had reflux over the past few wks. She is mildly tachycardic in the ER. She has been given Nitroglycerine x 1, GI cocktail, Zofran and Morphine and pain partially improved. Being Rx for breast cancer Port a Cath place 09/12/15 for chemo.  Echo done in January with normal  EF and normal GLS.  Had CTA negative for PE.  Enzymes negative and no acute ECG changes.  Sharp pain in right foot/leg.  Unable to walk well.  Still with some heaviness and pain this am.  No pleuritic component Just returning from [place] of right LE    ROS Past Medical History  Diagnosis Date  . Cancer ([facility])   . Migraine   . Asthma   . Breast cancer ([place])   . Arthritis   . Back disorder     degenerative disk disease  . Pap smear for cervical cancer screening 08/12/15  . Mammogram abnormal 08/24/15    first  . GERD (gastroesophageal reflux disease)     Family History  Problem Relation Age of Onset  . Lung cancer Mother 51    2 different types of lung cancer; metastasis to brain; smoker  . Other Mother     hx of hysterectomy for unspecified reason  . Bone cancer Maternal Uncle     dx. early 85s  . Breast cancer Maternal Grandmother     dx. early 37s, s/p  mastectomy  . Cancer Paternal Grandfather     unspecified type of cancer, dx. late 20s  . Congestive Heart Failure Father     smoker  . Stroke Father   . Cirrhosis Maternal Grandfather   . Heart Problems Maternal Grandfather   . Lung cancer Other     (maternal great uncle; MGM's brother); had a coal stove  . Cancer Cousin     unspecified type; d. early age (paternal first cousin once-removed)  . Cancer Cousin     dx. as a kid; in remission today; (paternal 2nd cousin)  . Cancer Cousin     unspecified type; d. early 26s; (maternal 1st cousin)    Social History   Social History  . Marital Status: Married    Spouse Name: Chrissie Noa  . Number of Children: 2  . Years of Education: [name]   Occupational History  . Web designer    Social History Main Topics  . Smoking status: Never Smoker   . Smokeless tobacco: Never Used  . Alcohol Use: 0.0 oz/week    0 Standard drinks or equivalent per week     Comment: occassional glass of wine  . Drug Use: No  . Sexual Activity: Yes   Other Topics Concern  . Not on file   Social History Narrative   **  Merged History Encounter **       First MP age 52.  LMP 08/26/15.   2 children carried to term.  Age at first live birth 60 H/o oral contrasception use     Past Surgical History  Procedure Laterality Date  . Cholecystectomy    . Cesarean section    . Portacath placement [name] 09/12/2015    Procedure: INSERTION PORT-A-CATH;  Surgeon: Excell Seltzer, MD;  Location: [facility];  Service: General;  Laterality: [name];     . aspirin EC  81 mg Oral Daily  . diclofenac sodium  2 g Topical QID  . heparin subcutaneous  5,000 Units Subcutaneous Q8H  . pantoprazole  40 mg Oral Daily      Physical Exam: Blood pressure 115/78, pulse 83, temperature 98 F (36.7 C), temperature source Oral, resp. rate 18, SpO2 98 %.    Affect appropriate Chronically ill black female Obese  HEENT: Alopecia  Neck supple with no adenopathy JVP normal no  bruits no thyromegaly Lungs clear with no wheezing and good diaphragmatic motion Heart:  S1/S2 no murmur, no rub, gallop or click Port a Cath right subclavian PMI normal Abdomen: benighn, BS positve, no tenderness, no AAA no bruit.  No HSM or HJR Distal pulses intact with no bruits No edema Neuro non-focal Skin warm and dry No muscular weakness   Labs:   Lab Results  Component Value Date   WBC 5.8 01/22/2016   HGB 11.9* 01/22/2016   HCT 35.9* 01/22/2016   MCV 90.4 01/22/2016   PLT 384 01/22/2016    Recent Labs Lab 01/21/16 1627  NA 142  K 3.6  CL 110  CO2 25  BUN 14  CREATININE 0.84  CALCIUM 10.5*  GLUCOSE 102*   Lab Results  Component Value Date   TROPONINI 0.03 01/22/2016     Radiology: Dg Chest 2 View  01/21/2016  CLINICAL DATA:  Chest pain, shortness of breath EXAM: CHEST  2 VIEW COMPARISON:  CTA chest dated 12/28/2015 FINDINGS: Lungs are clear.  No pleural effusion or pneumothorax. The heart is normal in size. Right chest port terminating in the mid SVC. IMPRESSION: No evidence of acute cardiopulmonary disease. Electronically Signed   By: Julian Hy M.D.   On: 01/21/2016 17:28   Dg Ankle Complete Right  01/21/2016  CLINICAL DATA:  Right medial ankle pain EXAM: RIGHT ANKLE - COMPLETE 3+ VIEW COMPARISON:  None. FINDINGS: No fracture or dislocation is seen. The ankle mortise is intact.  Tibiotalar degenerative changes. The base of the fifth metatarsal is unremarkable. Moderate plantar and posterior calcaneal enthesophytes. The visualized soft tissues are unremarkable. IMPRESSION: No fracture or dislocation is seen. Electronically Signed   By: Julian Hy M.D.   On: 01/21/2016 17:27   Ct Angio Chest Pe W/cm &/or Wo Cm  01/22/2016  CLINICAL DATA:  Centralized chest pain shortness of breath. Evaluate for pulmonary embolism. EXAM: CT ANGIOGRAPHY CHEST WITH CONTRAST TECHNIQUE: Multidetector CT imaging of the chest was performed using the standard protocol  during bolus administration of intravenous contrast. Multiplanar CT image reconstructions and MIPs were obtained to evaluate the vascular anatomy. CONTRAST:  100 cc Isovue 370 (patient received 13 hour premedication steroid prep and tolerated intravenous contrast without incident) COMPARISON:  Chest CT - 12/28/2015; chest radiograph - 01/21/2016; 09/16/2015 FINDINGS: Vascular Findings: There is adequate opacification of the pulmonary arterial system with the main pulmonary artery measuring 253 Hounsfield units. There are no discrete filling defects within the pulmonary arterial tree to  the level of the bilateral subsegmental pulmonary arteries. Evaluation of the distal subsegmental pulmonary arteries is degraded secondary to a combination of suboptimal vessel opacification, patient body habitus and respiratory artifact. Normal caliber the main pulmonary artery. Cardiomegaly. No pericardial effusion. Normal caliber the thoracic aorta. No definite thoracic aortic dissection on this nongated examination. Bovine configuration of the aortic arch. The left vertebral artery is incidentally noted to arise directly from the aortic arch. Review of the MIP images confirms the above findings. ---------------------------------------------------------------------------------- Nonvascular Findings: Evaluation of the pulmonary parenchyma is degraded secondary to a combination of patient body habitus and respiratory artifact. Mediastinum/Lymph Nodes: No mediastinal, hilar axillary lymphadenopathy. Lungs/Pleura: Bibasilar dependent consolidative subsegmental atelectasis. Minimal dependent subpleural ground-glass atelectasis. No discrete focal airspace opacities. No pleural effusion or pneumothorax. The central pulmonary airways appear widely patent. No discrete pulmonary nodules given limitation of the examination. Upper abdomen: Limited early arterial phase evaluation of the upper abdomen demonstrates sequela mild dilatation of the  common bile duct and centralized intrahepatic biliary duct dilatation, likely the sequela post cholecystectomy state and biliary reservoir phenomenon. Musculoskeletal: No acute or aggressive osseous abnormalities. Stigmata of DISH throughout the thoracic spine. Grossly unchanged approximately 2.3 x 1.1 cm hypo attenuating nodule within the inferior lateral aspect the left lobe of the thyroid (image 7, series 13). IMPRESSION: 1. Bibasilar atelectasis without acute cardiopulmonary disease. Specifically, no evidence of pulmonary embolism to the level of the bilateral subsegmental pulmonary arteries. 2. Cardiomegaly. 3. Grossly unchanged suspected approximately 2.3 cm hypo attenuating nodule within the inferior aspect the left lobe of the thyroid. Further evaluation with dedicated nonemergent thyroid ultrasound could be performed as indicated. Electronically Signed   By: Sandi Mariscal M.D.   On: 01/22/2016 10:08   Ct Angio Chest Pe W/cm &/or Wo Cm  12/28/2015  CLINICAL DATA:  Bilateral axillary, pleural and back pain for 1 week with shortness of breath. History of breast cancer, chemotherapy in progress. Evaluate for pulmonary embolism. History of contrast allergy. EXAM: CT ANGIOGRAPHY CHEST WITH CONTRAST TECHNIQUE: Multidetector CT imaging of the chest was performed using the standard protocol during bolus administration of intravenous contrast. Multiplanar CT image reconstructions and MIPs were obtained to evaluate the vascular anatomy. CONTRAST:  100 ml Isovue 370. The patient was premedicated according to the 13hour steroid and Benadryl prep due to a history of iodinated contrast allergy. There were no immediate complications. COMPARISON:  Radiographs 09/16/2015.  Abdominal CT 12/08/2015. FINDINGS: Mediastinum: The pulmonary arteries are well opacified with contrast. There is no evidence of acute pulmonary embolism. No significant atherosclerosis. The heart size is normal. There is a small hiatal hernia. There are  no enlarged mediastinal, hilar or axillary lymph nodes. There is low-density along the posterior aspect of the left thyroid lobe, measuring 2.0 x 1.1 cm on image 15. Lungs/Pleura: There is no pleural effusion.The lungs are clear. Upper abdomen: The visualized upper abdomen has a stable appearance. There is mild extrahepatic biliary prominence status post cholecystectomy. Musculoskeletal/Chest wall: No chest wall lesion or acute osseous findings.Mild thoracic spine degenerative changes. Review of the MIP images confirms the above findings. IMPRESSION: 1. No evidence of acute pulmonary embolism or other acute chest process. 2. Small hiatal hernia. 3. Low-density along the posterior aspect of the left thyroid lobe could reflect a thyroid or parathyroid nodule. Consider further evaluation with thyroid ultrasound. Electronically Signed   By: Richardean Sale M.D.   On: 12/28/2015 14:37   Mr Breast Bilateral W Wo Contrast  01/16/2016  CLINICAL DATA:  45 year old patient diagnosed with left breast cancer following ultrasound-guided biopsy of a left breast mass at 1:30 position in December 2016. Pathology revealed a grade 3 invasive ductal carcinoma. Subsequently, breast MRI was performed, showing bilateral areas of enhancement. Both of these areas were biopsied, including linear enhancement in the upper-outer quadrant of the left breast and a 9 mm enhancing mass in the upper-inner quadrant of the right breast. Both of these areas demonstrated pseudoangiomatous stromal hyperplasia ([place]). The patient has been receiving neoadjuvant chemotherapy, recently terminated before beginning the 7th cycle of Taxol, due to severe nausea and vomiting. She presents for follow-up breast MRI after neoadjuvant therapy. LABS:  None performed EXAM: BILATERAL BREAST MRI WITH AND WITHOUT CONTRAST TECHNIQUE: Multiplanar, multisequence MR images of both breasts were obtained prior to and following the intravenous administration of 20 ml of  MultiHance. THREE-DIMENSIONAL MR IMAGE RENDERING ON INDEPENDENT WORKSTATION: Three-dimensional MR images were rendered by post-processing of the original MR data on an independent workstation. The three-dimensional MR images were interpreted, and findings are reported in the following complete MRI report for this study. Three dimensional images were evaluated at the independent [employer] workstation COMPARISON:  Breast MRI 09/14/2015 and bilateral breast MRI biopsies 09/28/2015. Bilateral mammogram 08/12/2015. FINDINGS: Breast composition: b. Scattered fibroglandular tissue. Background parenchymal enhancement: Mild Right breast: No mass or abnormal enhancement. Biopsy clip artifact is noted in the anterior third of the upper inner quadrant at the site of prior benign MRI-guided breast biopsy ([place]). Please note there is no mass or suspicious enhancement in the region of the biopsy clip. Left breast: There is an irregular, heterogeneously enhancing mass in the upper-outer quadrant of the left breast, posterior third, corresponding to the biopsy proven malignancy. The mass has decreased in size since the breast MRI of 09/14/2015. Currently, the mass measures 2.9 cm x 1.8 x 1.7 cm (AP by transverse by craniocaudal). Previously, on the breast MRI of 09/14/2015, this mass measured 5.1 x 4.3 x 4.4 cm. A biopsy clip is associated with the the known malignancy. In the anterior third of the upper-outer quadrant of the left breast is a second biopsy clip, at the site of the benign MRI-guided biopsy ([place]). There is no mass or suspicious enhancement in the region of this clip. There are no new areas of concern in the left breast. Lymph nodes: No abnormal appearing lymph nodes. Ancillary findings:  None. IMPRESSION: 1. Positive response to neoadjuvant chemotherapy in this patient with a known upper-outer quadrant left breast invasive ductal carcinoma. The malignant mass now measures 2.9 x 1.8 x 1.7 cm. 2. There are no additional  areas of concern in the left breast on MRI. No suspicious enhancement in the region of the previous MRI-guided biopsy in the anterior upper outer quadrant of the left breast. 3. No MRI evidence of malignancy in the right breast. No suspicious enhancement in the region of the previous MRI guided biopsy in the anterior upper inner quadrant. RECOMMENDATION: Continue treatment planning for known left breast invasive ductal carcinoma. BI-RADS CATEGORY  6: Known biopsy-proven malignancy. Electronically Signed   By: Curlene Dolphin M.D.   On: 01/16/2016 11:06    EKG: ST 107 normal no acute ST changes    ASSESSMENT AND PLAN:   Chest Pain:  Ongoing despite r/o and no acute ECG changes.  CTA negative for PE Favor inpatient myovue Will need lexiscan and possibly 2 day protocol due to size Try toradol for pain.  Encouraging that enzymes and ECG;s are non acute.  Leg Pain:  Reviewed images from LE duplex and no DVT official report pending. Good pulses Appears to be non vascular pain  Breast Cancer:  F/u oncology   Neoadjuvant chemotherapy with dose dense Adriamycin and Cytoxan 4 followed by Taxol and carboplatin weekly 12  She is being  Referred for lumpectomy as MRI showed nice response with tumor shrinkage.  Important for preop to r/o CAD given chest pain    Jenkins Rouge 01/22/2016, 11:38 AM

## 2016-01-23 ENCOUNTER — Ambulatory Visit (HOSPITAL_COMMUNITY)
Admit: 2016-01-23 | Discharge: 2016-01-23 | Disposition: A | Discharge status: Home / Self Care | Payer: Federal, State, Local not specified - PPO | Attending: Cardiovascular Disease | Admitting: Cardiovascular Disease

## 2016-01-23 ENCOUNTER — Other Ambulatory Visit: Payer: Self-pay

## 2016-01-23 ENCOUNTER — Ambulatory Visit (HOSPITAL_COMMUNITY)
Admission: RE | Admit: 2016-01-23 | Discharge: 2016-01-23 | Disposition: A | Discharge status: Home / Self Care | Payer: Federal, State, Local not specified - PPO | Source: Ambulatory Visit | Attending: Cardiovascular Disease | Admitting: Cardiovascular Disease

## 2016-01-23 DIAGNOSIS — Z853 Personal history of malignant neoplasm of breast: Secondary | ICD-10-CM | POA: Diagnosis not present

## 2016-01-23 DIAGNOSIS — Z9221 Personal history of antineoplastic chemotherapy: Secondary | ICD-10-CM | POA: Diagnosis not present

## 2016-01-23 DIAGNOSIS — M25571 Pain in right ankle and joints of right foot: Secondary | ICD-10-CM | POA: Diagnosis not present

## 2016-01-23 DIAGNOSIS — R079 Chest pain, unspecified: Secondary | ICD-10-CM | POA: Diagnosis not present

## 2016-01-23 DIAGNOSIS — I200 Unstable angina: Secondary | ICD-10-CM | POA: Diagnosis not present

## 2016-01-23 DIAGNOSIS — C50412 Malignant neoplasm of upper-outer quadrant of left female breast: Secondary | ICD-10-CM | POA: Diagnosis not present

## 2016-01-23 DIAGNOSIS — R0789 Other chest pain: Secondary | ICD-10-CM | POA: Diagnosis not present

## 2016-01-23 LAB — NM MYOCAR MULTI W/SPECT W/WALL MOTION / EF
CHL CUP MPHR: 174 {beats}/min
CHL CUP RESTING HR STRESS: 77 {beats}/min
CSEPED: 0 min
CSEPEDS: 0 s
CSEPEW: 1 METS
Peak HR: 115 {beats}/min
Percent HR: 66 %

## 2016-01-23 LAB — CBC
HCT: 34.4 % — ABNORMAL LOW (ref 36.0–46.0)
HEMOGLOBIN: 10.9 g/dL — AB (ref 12.0–15.0)
MCH: 29.9 pg (ref 26.0–34.0)
MCHC: 31.7 g/dL (ref 30.0–36.0)
MCV: 94.2 fL (ref 78.0–100.0)
Platelets: 369 10*3/uL (ref 150–400)
RBC: 3.65 MIL/uL — AB (ref 3.87–5.11)
RDW: 15.6 % — ABNORMAL HIGH (ref 11.5–15.5)
WBC: 8.4 10*3/uL (ref 4.0–10.5)

## 2016-01-23 LAB — TROPONIN I: TROPONIN I: 0.05 ng/mL — AB (ref <0.031)

## 2016-01-23 MED ORDER — NITROGLYCERIN 0.4 MG SL SUBL
0.4000 mg | SUBLINGUAL_TABLET | SUBLINGUAL | Status: DC | PRN
  Administered 2016-01-23: 0.4 mg via SUBLINGUAL

## 2016-01-23 MED ORDER — TECHNETIUM TC 99M TETROFOSMIN IV KIT
10.0000 | PACK | Freq: Once | INTRAVENOUS | Status: AC | PRN
  Administered 2016-01-23: 10 via INTRAVENOUS

## 2016-01-23 MED ORDER — MORPHINE SULFATE (PF) 2 MG/ML IV SOLN
2.0000 mg | INTRAVENOUS | Status: AC | PRN
  Administered 2016-01-23 (×2): 2 mg via INTRAVENOUS
  Filled 2016-01-23: qty 1

## 2016-01-23 MED ORDER — REGADENOSON 0.4 MG/5ML IV SOLN
INTRAVENOUS | Status: AC
  Filled 2016-01-23: qty 5

## 2016-01-23 MED ORDER — TECHNETIUM TC 99M TETROFOSMIN IV KIT
30.0000 | PACK | Freq: Once | INTRAVENOUS | Status: AC | PRN
  Administered 2016-01-23: 30 via INTRAVENOUS

## 2016-01-23 MED ORDER — MORPHINE SULFATE (PF) 4 MG/ML IV SOLN
2.0000 mg | INTRAVENOUS | Status: AC | PRN
  Administered 2016-01-23 (×2): 2 mg via INTRAVENOUS

## 2016-01-23 MED ORDER — HEPARIN SOD (PORK) LOCK FLUSH 100 UNIT/ML IV SOLN
500.0000 [IU] | INTRAVENOUS | Status: DC
  Filled 2016-01-23: qty 5

## 2016-01-23 MED ORDER — MORPHINE SULFATE (PF) 4 MG/ML IV SOLN
INTRAVENOUS | Status: AC
  Filled 2016-01-23: qty 1

## 2016-01-23 MED ORDER — MORPHINE SULFATE (PF) 2 MG/ML IV SOLN: 2.0000 mg | Freq: Once | INTRAVENOUS | Status: DC

## 2016-01-23 MED ORDER — REGADENOSON 0.4 MG/5ML IV SOLN
0.4000 mg | Freq: Once | INTRAVENOUS | Status: AC
  Administered 2016-01-23: 0.4 mg via INTRAVENOUS

## 2016-01-23 MED ORDER — MORPHINE SULFATE (PF) 2 MG/ML IV SOLN
2.0000 mg | INTRAVENOUS | Status: AC | PRN
  Administered 2016-01-24 (×2): 2 mg via INTRAVENOUS
  Filled 2016-01-23: qty 1

## 2016-01-23 MED ORDER — HEPARIN SOD (PORK) LOCK FLUSH 100 UNIT/ML IV SOLN
500.0000 [IU] | INTRAVENOUS | Status: DC | PRN
  Administered 2016-01-23: 500 [IU]
  Filled 2016-01-23 (×2): qty 5

## 2016-01-23 MED ORDER — NITROGLYCERIN 0.4 MG SL SUBL
SUBLINGUAL_TABLET | SUBLINGUAL | Status: AC
  Filled 2016-01-23: qty 1

## 2016-01-23 NOTE — Progress Notes (Signed)
PROGRESS NOTE    Cindy Bradshaw  [id number] DOB: Jun 05, 1971 DOA: 01/21/2016  PCP: [name], MD   Brief Narrative:  Cindy Bradshaw is a 45 y.o. 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 She presents for right ankle pain which began yesterday. She has not had any injury. Unable to walk due to ankle pain. She suspected it was from chemo as it was pulsating like nerve pain. Pain is severe.  Chest pain starting 1 pm today at rest, pressure like with gradual increase to 9/10. Is nauseated. No dyspnea, diaphoresis or vomiting. Has a h/o acid reflux but controls it with watching her diet. Has not had reflux over the past few wks. She is mildly tachycardic in the ER. She has been given Nitroglycerine x 1, GI cocktail, Zofran and Morphine and pain is improving.    Subjective: Continues to have chest pain- it was severe during the stress test. Ankle continues to hurt.   Assessment & Plan:   Principal Problem:   Chest pain - CTA neg for PE -  asked for cards eval- Troponin neg- EKG unrevealing- Myoview shows moderate sized reversible ischemia in anteroseptal wall extending to the apex - per note from cardiology PA, cardiologist will review this Myoview tomorrow AM- tentatively placed patient for cath   Active Problems: Allergic reaction - severe itching from CT dye although she was given a prep- Benadryl - resolved    Right ankle pain - - xray unrevaling- pain and tenderness in back of ankle going up to lower calf - venous duplex>>no DVT - Voltaren gel/ kpad- allergic to Toradol  Breast cancer of upper-outer quadrant of left female breast - finished chemo which resulted in shrinking the tumor from 5.1 to 2.9 cm and is due to have lumpectomy   DVT prophylaxis: Heparin Code Status: full code Family Communication: husband Disposition Plan: per cardiology  Consultants:   cardiology Procedures:   Venous duplex right  leg Antimicrobials:  Anti-infectives    None       Objective: Filed Vitals:   01/22/16 2051 01/22/16 2335 01/23/16 0604 01/23/16 1300  BP: 146/93 136/79 143/88 128/74  Pulse: 99 89 94 82  Temp: 98.1 F (36.7 C)  98.4 F (36.9 C) 98.5 F (36.9 C)  TempSrc: Oral  Oral Oral  Resp: 20  16 18   SpO2: 97%  98% 100%    Intake/Output Summary (Last 24 hours) at 01/23/16 1808 Last data filed at 01/23/16 1532  Gross per 24 hour  Intake    480 ml  Output      2 ml  Net    478 ml   There were no vitals filed for this visit.  Examination: General exam: Appears comfortable  HEENT: PERRLA, oral mucosa moist, no sclera icterus or thrush Respiratory system: Clear to auscultation. Respiratory effort normal. Cardiovascular system: S1 & S2 heard, RRR.  No murmurs  Gastrointestinal system: Abdomen soft, non-tender, nondistended. Normal bowel sound. No organomegaly Central nervous system: Alert and oriented. No focal neurological deficits. Extremities: No cyanosis, clubbing or edema- tender in right ankle and lower calf with pain on ROM Skin: No rashes or ulcers Psychiatry:  Mood & affect appropriate.     Data Reviewed: I have personally reviewed following labs and imaging studies  CBC:  Recent Labs Lab 01/21/16 1627 01/22/16 0637 01/23/16 0609  WBC 5.4 5.8 8.4  HGB 12.7 11.9* 10.9*  HCT 37.8 35.9* 34.4*  MCV 89.6 90.4 94.2  PLT 416* 384 0000000   Basic Metabolic Panel:  Recent Labs Lab 01/21/16 1627  NA 142  K 3.6  CL 110  CO2 25  GLUCOSE 102*  BUN 14  CREATININE 0.84  CALCIUM 10.5*   GFR: Estimated Creatinine Clearance: 104.2 mL/min (by C-G formula based on Cr of 0.84). Liver Function Tests: No results for input(s): AST, ALT, ALKPHOS, BILITOT, PROT, ALBUMIN in the last 168 hours. No results for input(s): LIPASE, AMYLASE in the last 168 hours. No results for input(s): AMMONIA in the last 168 hours. Coagulation Profile:  Recent Labs Lab 01/21/16 1627  INR 1.02    Cardiac Enzymes:  Recent Labs Lab 01/21/16 1903 01/22/16 0026 01/22/16 0637  TROPONINI 0.04* 0.04* 0.03   BNP (last 3 results) No results for input(s): PROBNP in the last 8760 hours. HbA1C: No results for input(s): HGBA1C in the last 72 hours. CBG: No results for input(s): GLUCAP in the last 168 hours. Lipid Profile: No results for input(s): CHOL, HDL, LDLCALC, TRIG, CHOLHDL, LDLDIRECT in the last 72 hours. Thyroid Function Tests: No results for input(s): TSH, T4TOTAL, FREET4, T3FREE, THYROIDAB in the last 72 hours. Anemia Panel: No results for input(s): VITAMINB12, FOLATE, FERRITIN, TIBC, IRON, RETICCTPCT in the last 72 hours. Urine analysis:    Component Value Date/Time   COLORURINE YELLOW 01/23/2015 0930   APPEARANCEUR CLEAR 01/23/2015 0930   LABSPEC 1.020 01/04/2016 1512   LABSPEC 1.010 01/23/2015 0930   PHURINE 6.0 01/04/2016 1512   PHURINE 5.5 01/23/2015 0930   GLUCOSEU Negative 01/04/2016 1512   GLUCOSEU NEGATIVE 01/23/2015 0930   HGBUR Moderate 01/04/2016 1512   HGBUR SMALL* 01/23/2015 0930   BILIRUBINUR Negative 01/04/2016 1512   BILIRUBINUR NEGATIVE 01/23/2015 0930   KETONESUR Negative 01/04/2016 1512   KETONESUR NEGATIVE 01/23/2015 0930   PROTEINUR < 30 01/04/2016 1512   PROTEINUR NEGATIVE 01/23/2015 0930   UROBILINOGEN 0.2 01/04/2016 1512   UROBILINOGEN 0.2 01/23/2015 0930   NITRITE Negative 01/04/2016 1512   NITRITE NEGATIVE 01/23/2015 0930   LEUKOCYTESUR Negative 01/04/2016 1512   LEUKOCYTESUR NEGATIVE 01/23/2015 0930   Sepsis Labs: @LABRCNTIP (procalcitonin:4,lacticidven:4) )No results found for this or any previous visit (from the past 240 hour(s)).       Radiology Studies: Ct Angio Chest Pe W/cm &/or Wo Cm  01/22/2016  CLINICAL DATA:  Centralized chest pain shortness of breath. Evaluate for pulmonary embolism. EXAM: CT ANGIOGRAPHY CHEST WITH CONTRAST TECHNIQUE: Multidetector CT imaging of the chest was performed using the standard protocol  during bolus administration of intravenous contrast. Multiplanar CT image reconstructions and MIPs were obtained to evaluate the vascular anatomy. CONTRAST:  100 cc Isovue 370 (patient received 13 hour premedication steroid prep and tolerated intravenous contrast without incident) COMPARISON:  Chest CT - 12/28/2015; chest radiograph - 01/21/2016; 09/16/2015 FINDINGS: Vascular Findings: There is adequate opacification of the pulmonary arterial system with the main pulmonary artery measuring 253 Hounsfield units. There are no discrete filling defects within the pulmonary arterial tree to the level of the bilateral subsegmental pulmonary arteries. Evaluation of the distal subsegmental pulmonary arteries is degraded secondary to a combination of suboptimal vessel opacification, patient body habitus and respiratory artifact. Normal caliber the main pulmonary artery. Cardiomegaly. No pericardial effusion. Normal caliber the thoracic aorta. No definite thoracic aortic dissection on this nongated examination. Bovine configuration of the aortic arch. The left vertebral artery is incidentally noted to arise directly from the aortic arch. Review of the MIP images confirms the above findings. ---------------------------------------------------------------------------------- Nonvascular Findings: Evaluation of the pulmonary parenchyma  is degraded secondary to a combination of patient body habitus and respiratory artifact. Mediastinum/Lymph Nodes: No mediastinal, hilar axillary lymphadenopathy. Lungs/Pleura: Bibasilar dependent consolidative subsegmental atelectasis. Minimal dependent subpleural ground-glass atelectasis. No discrete focal airspace opacities. No pleural effusion or pneumothorax. The central pulmonary airways appear widely patent. No discrete pulmonary nodules given limitation of the examination. Upper abdomen: Limited early arterial phase evaluation of the upper abdomen demonstrates sequela mild dilatation of the  common bile duct and centralized intrahepatic biliary duct dilatation, likely the sequela post cholecystectomy state and biliary reservoir phenomenon. Musculoskeletal: No acute or aggressive osseous abnormalities. Stigmata of DISH throughout the thoracic spine. Grossly unchanged approximately 2.3 x 1.1 cm hypo attenuating nodule within the inferior lateral aspect the left lobe of the thyroid (image 7, series 13). IMPRESSION: 1. Bibasilar atelectasis without acute cardiopulmonary disease. Specifically, no evidence of pulmonary embolism to the level of the bilateral subsegmental pulmonary arteries. 2. Cardiomegaly. 3. Grossly unchanged suspected approximately 2.3 cm hypo attenuating nodule within the inferior aspect the left lobe of the thyroid. Further evaluation with dedicated nonemergent thyroid ultrasound could be performed as indicated. Electronically Signed   By: Sandi Mariscal M.D.   On: 01/22/2016 10:08   Nm Myocar Multi W/spect W/wall Motion / Ef  01/23/2016  CLINICAL DATA:  45 year old female with chest pain EXAM: MYOCARDIAL IMAGING WITH SPECT (REST AND PHARMACOLOGIC-STRESS) GATED LEFT VENTRICULAR WALL MOTION STUDY LEFT VENTRICULAR EJECTION FRACTION TECHNIQUE: Standard myocardial SPECT imaging was performed after resting intravenous injection of 10 mCi Tc-15m tetrofosmin. Subsequently, intravenous infusion of Lexiscan was performed under the supervision of the Cardiology staff. At peak effect of the drug, 30 mCi Tc-71m tetrofosmin was injected intravenously and standard myocardial SPECT imaging was performed. Quantitative gated imaging was also performed to evaluate left ventricular wall motion, and estimate left ventricular ejection fraction. COMPARISON:  CT PE study 01/22/2016 FINDINGS: Perfusion: Moderate region of decreased radiotracer uptake following stress imaging in the anteroseptal wall of the mid ventricle extending to the apex and true apex. Wall Motion: Normal left ventricular wall motion. No  left ventricular dilation. Left Ventricular Ejection Fraction: 54 % End diastolic volume 0000000 ml End systolic volume 50 ml IMPRESSION: 1. Positive for moderate size region of reversible ischemia in the anteroseptal wall of the mid ventricle extending through the apex to the true apex. 2. Normal left ventricular wall motion. 3. Left ventricular ejection fraction 54% 4. Non invasive risk stratification*: Intermediate *2012 Appropriate Use Criteria for Coronary Revascularization Focused Update: J Am Coll Cardiol. [id number]. http://content.[email or web address]?[email or web address] Electronically Signed   By: Jacqulynn Cadet M.D.   On: 01/23/2016 16:44      Scheduled Meds: . aspirin EC  81 mg Oral Daily  . diclofenac sodium  2 g Topical QID  . heparin subcutaneous  5,000 Units Subcutaneous Q8H  . heparin lock flush  500 Units Intracatheter Q30 days  . pantoprazole  40 mg Oral Daily  . regadenoson  0.4 mg Intravenous Once   Continuous Infusions:     LOS: 2 days    Time spent in minutes: 24    [place], MD [facility] Pager: [email or web address] Password [facility] 01/23/2016, 6:08 PM

## 2016-01-23 NOTE — Care Management Note (Signed)
Case Management Note  Patient Details  Name: Cindy Bradshaw MRN: [id number] Date of Birth: [birth year]  Subjective/Objective: 45 y/o f admitted w/Chest pain. From home.                   Action/Plan:d/c plan home.   Expected Discharge Date:                  Expected Discharge Plan:  Home/Self Care  In-House Referral:     Discharge planning Services  CM Consult  Post Acute Care Choice:    Choice offered to:     DME Arranged:    DME Agency:     HH Arranged:    HH Agency:     Status of Service:  In process, will continue to follow  Medicare Important Message Given:    Date Medicare IM Given:    Medicare IM give by:    Date Additional Medicare IM Given:    Additional Medicare Important Message give by:     If discussed at [place] of Stay Meetings, dates discussed:    Additional Comments:  Dessa Phi, RN 01/23/2016, 2:51 PM

## 2016-01-23 NOTE — Progress Notes (Signed)
    Subjective: Some CP still and right lower extremity pain.  morphine helps.  Objective: Vital signs in last 24 hours: Temp:  [98.1 F (36.7 C)-98.6 F (37 C)] 98.4 F (36.9 C) (05/22 0604) Pulse Rate:  [89-99] 94 (05/22 0604) Resp:  [16-20] 16 (05/22 0604) BP: (118-146)/(66-93) 143/88 mmHg (05/22 0604) SpO2:  [97 %-100 %] 98 % (05/22 0604) Last BM Date: 01/20/16  Intake/Output from previous day: 05/21 0701 - 05/22 0700 In: 1200 [P.O.:1200] Out: -  Intake/Output this shift:    Medications Scheduled Meds: . aspirin EC  81 mg Oral Daily  . diclofenac sodium  2 g Topical QID  . heparin subcutaneous  5,000 Units Subcutaneous Q8H  . pantoprazole  40 mg Oral Daily  . regadenoson  0.4 mg Intravenous Once   Continuous Infusions:  PRN Meds:.acetaminophen, diphenhydrAMINE, nitroGLYCERIN, nitroGLYCERIN, ondansetron (ZOFRAN) IV, sodium chloride flush, triamcinolone cream  PE: General appearance: alert, cooperative and no distress Lungs: clear to auscultation bilaterally Heart: regular rate and rhythm, S1, S2 normal, no murmur, click, rub or gallop Extremities: No LEE.  pain in the right foot with palpation of DP pulse which is 2+ Pulses: 2+ and symmetric Skin: Warm and dry Neurologic: Grossly normal  Lab Results:   Recent Labs  01/21/16 1627 01/22/16 0637 01/23/16 0609  WBC 5.4 5.8 8.4  HGB 12.7 11.9* 10.9*  HCT 37.8 35.9* 34.4*  PLT 416* 384 369   BMET  Recent Labs  01/21/16 1627  NA 142  K 3.6  CL 110  CO2 25  GLUCOSE 102*  BUN 14  CREATININE 0.84  CALCIUM 10.5*   PT/INR  Recent Labs  01/21/16 1627  LABPROT 13.2  INR 1.02     Assessment/Plan   Principal Problem:   Chest pain Active Problems:   Breast cancer of upper-outer quadrant of left female breast (HCC)   Normocytic normochromic anemia   Chemotherapy-induced peripheral neuropathy (HCC)   Right ankle pain  Chest Pain:  She reports having some this morning.  Morphine is helping.    no acute ECG changes.  No alrms on tele.  Troponin 0.04 x2. CTA negative for PE.  Inpatient lexiscan myovue scheduled for today and tomorrow(2-day) .  Normal EF by echo in January   Leg Pain:  No DVT on dopplers.   Good distal pulses.  She denies any injury.  Thought to be related to chemo.   Breast Cancer:  F/u oncology Neoadjuvant chemotherapy with dose dense Adriamycin and Cytoxan 4 followed by Taxol and carboplatin weekly 12 She is being referred for lumpectomy as MRI showed nice response with tumor shrinkage. Important for preop to r/o CAD given chest pain    LOS: 2 days    Tarri Fuller PA-C 01/23/2016 8:14 AM

## 2016-01-23 NOTE — Progress Notes (Addendum)
Discussed with Dr. Abe People report  IMPRESSION: 1. Positive for moderate size region of reversible ischemia in the anteroseptal wall of the mid ventricle extending through the apex to the true apex.  2. Normal left ventricular wall motion.  3. Left ventricular ejection fraction 54%  4. Non invasive risk stratification*: Intermediate   Dr. Debara Pickett will discuss with patient workup tomorrow AM. I have tentatively put her on cath board for tomorrow afternoon, Dr. Debara Pickett will discuss with her tomorrow AM if it is felt she will need cath, Dr. Debara Pickett will place order. Otherwise Dr. Debara Pickett may decide to cancel cath. Further decision will be made tomorrow.   Hilbert Corrigan PA Pager: [phone]  Addendum: informed patient, but unable to reach patient's nurse

## 2016-01-23 NOTE — Progress Notes (Addendum)
One day lexiscan stress test completed without complication. Pending final result by [facility] Radiology  Signed, Almyra Deforest PA Pager: [phone]  Addendum: checked with nuc med staff, it appears the test was ordered as 1 day test, although she was under impression it was a 2 day test. She already had resting image today, and she is going to have stress image next.  Note during and after test she had unremitting chest pain, there was no EKG change to suggest STEMI or NSTEMI. Her chest pain eventually improved with coke, IV morphine and sublingual nitro. Since she has been having ongoing chest pain since Sat, doubt cardiac, but will repeat EKG in 30-60 min to r/o evolving changes and add another serial enzyme.   Hilbert Corrigan PA Pager: [phone]

## 2016-01-24 ENCOUNTER — Encounter (HOSPITAL_COMMUNITY)
Admission: EM | Disposition: A | Discharge status: Home / Self Care | Payer: Self-pay | Source: Home / Self Care | Attending: Internal Medicine

## 2016-01-24 ENCOUNTER — Other Ambulatory Visit: Payer: Self-pay

## 2016-01-24 ENCOUNTER — Ambulatory Visit: Payer: Self-pay

## 2016-01-24 DIAGNOSIS — C50412 Malignant neoplasm of upper-outer quadrant of left female breast: Secondary | ICD-10-CM

## 2016-01-24 DIAGNOSIS — T7840XD Allergy, unspecified, subsequent encounter: Secondary | ICD-10-CM

## 2016-01-24 DIAGNOSIS — R931 Abnormal findings on diagnostic imaging of heart and coronary circulation: Secondary | ICD-10-CM

## 2016-01-24 DIAGNOSIS — R079 Chest pain, unspecified: Secondary | ICD-10-CM | POA: Diagnosis not present

## 2016-01-24 DIAGNOSIS — M25571 Pain in right ankle and joints of right foot: Secondary | ICD-10-CM | POA: Diagnosis not present

## 2016-01-24 DIAGNOSIS — R072 Precordial pain: Secondary | ICD-10-CM

## 2016-01-24 HISTORY — PX: CARDIAC CATHETERIZATION: SHX172

## 2016-01-24 LAB — CBC
HCT: 36.3 % (ref 36.0–46.0)
Hemoglobin: 11.6 g/dL — ABNORMAL LOW (ref 12.0–15.0)
MCH: 29.9 pg (ref 26.0–34.0)
MCHC: 32 g/dL (ref 30.0–36.0)
MCV: 93.6 fL (ref 78.0–100.0)
PLATELETS: 368 10*3/uL (ref 150–400)
RBC: 3.88 MIL/uL (ref 3.87–5.11)
RDW: 15.6 % — AB (ref 11.5–15.5)
WBC: 5.9 10*3/uL (ref 4.0–10.5)

## 2016-01-24 LAB — HCG, SERUM, QUALITATIVE: Preg, Serum: NEGATIVE

## 2016-01-24 LAB — TROPONIN I: TROPONIN I: 0.04 ng/mL — AB (ref <0.031)

## 2016-01-24 SURGERY — LEFT HEART CATH AND CORONARY ANGIOGRAPHY

## 2016-01-24 MED ORDER — SODIUM CHLORIDE 0.9 % WEIGHT BASED INFUSION
3.0000 mL/kg/h | INTRAVENOUS | Status: AC
  Administered 2016-01-24: 3 mL/kg/h via INTRAVENOUS

## 2016-01-24 MED ORDER — SODIUM CHLORIDE 0.9 % WEIGHT BASED INFUSION: 1.0000 mL/kg/h | INTRAVENOUS | Status: DC

## 2016-01-24 MED ORDER — HEPARIN SODIUM (PORCINE) 1000 UNIT/ML IJ SOLN
INTRAMUSCULAR | Status: DC | PRN
  Administered 2016-01-24: 5500 [IU] via INTRAVENOUS

## 2016-01-24 MED ORDER — HEPARIN (PORCINE) IN NACL 2-0.9 UNIT/ML-% IJ SOLN
INTRAMUSCULAR | Status: DC | PRN
  Administered 2016-01-24: 10 mL via INTRA_ARTERIAL

## 2016-01-24 MED ORDER — PREDNISONE 1 MG PO TABS
1.0000 mg | ORAL_TABLET | ORAL | Status: AC
  Administered 2016-01-25: 1 mg via ORAL
  Filled 2016-01-24: qty 1

## 2016-01-24 MED ORDER — MORPHINE SULFATE (PF) 2 MG/ML IV SOLN
INTRAVENOUS | Status: AC
  Filled 2016-01-24: qty 1

## 2016-01-24 MED ORDER — LIDOCAINE HCL (PF) 1 % IJ SOLN
INTRAMUSCULAR | Status: DC | PRN
  Administered 2016-01-24: 3 mL

## 2016-01-24 MED ORDER — HEPARIN (PORCINE) IN NACL 2-0.9 UNIT/ML-% IJ SOLN
INTRAMUSCULAR | Status: DC | PRN
  Administered 2016-01-24: 1000 mL

## 2016-01-24 MED ORDER — FAMOTIDINE IN NACL 20-0.9 MG/50ML-% IV SOLN
20.0000 mg | Freq: Once | INTRAVENOUS | Status: AC
  Administered 2016-01-24: 20 mg via INTRAVENOUS
  Filled 2016-01-24: qty 50

## 2016-01-24 MED ORDER — FENTANYL CITRATE (PF) 100 MCG/2ML IJ SOLN
INTRAMUSCULAR | Status: AC
  Filled 2016-01-24: qty 2

## 2016-01-24 MED ORDER — MIDAZOLAM HCL 2 MG/2ML IJ SOLN
INTRAMUSCULAR | Status: DC | PRN
  Administered 2016-01-24: 2 mg via INTRAVENOUS

## 2016-01-24 MED ORDER — IOPAMIDOL (ISOVUE-370) INJECTION 76%
INTRAVENOUS | Status: DC | PRN
Start: 2016-01-24 — End: 2016-01-24
  Administered 2016-01-24: 50 mL via INTRA_ARTERIAL

## 2016-01-24 MED ORDER — VERAPAMIL HCL 2.5 MG/ML IV SOLN
INTRAVENOUS | Status: AC
  Filled 2016-01-24: qty 2

## 2016-01-24 MED ORDER — DIPHENHYDRAMINE HCL 50 MG/ML IJ SOLN
25.0000 mg | Freq: Once | INTRAMUSCULAR | Status: AC
  Administered 2016-01-24: 25 mg via INTRAVENOUS
  Filled 2016-01-24: qty 1

## 2016-01-24 MED ORDER — HEPARIN SODIUM (PORCINE) 1000 UNIT/ML IJ SOLN
INTRAMUSCULAR | Status: AC
  Filled 2016-01-24: qty 1

## 2016-01-24 MED ORDER — METHYLPREDNISOLONE SODIUM SUCC 125 MG IJ SOLR
125.0000 mg | INTRAMUSCULAR | Status: AC
  Administered 2016-01-24: 125 mg via INTRAVENOUS
  Filled 2016-01-24: qty 2

## 2016-01-24 MED ORDER — PREDNISONE 50 MG PO TABS
60.0000 mg | ORAL_TABLET | ORAL | Status: AC
  Administered 2016-01-24: 60 mg via ORAL
  Filled 2016-01-24: qty 1

## 2016-01-24 MED ORDER — HEPARIN (PORCINE) IN NACL 2-0.9 UNIT/ML-% IJ SOLN
INTRAMUSCULAR | Status: AC
  Filled 2016-01-24: qty 1000

## 2016-01-24 MED ORDER — SODIUM CHLORIDE 0.9 % IV SOLN: 250.0000 mL | INTRAVENOUS | Status: DC | PRN

## 2016-01-24 MED ORDER — LIDOCAINE HCL (PF) 1 % IJ SOLN
INTRAMUSCULAR | Status: AC
  Filled 2016-01-24: qty 30

## 2016-01-24 MED ORDER — MIDAZOLAM HCL 2 MG/2ML IJ SOLN
INTRAMUSCULAR | Status: AC
  Filled 2016-01-24: qty 2

## 2016-01-24 MED ORDER — SODIUM CHLORIDE 0.9 % WEIGHT BASED INFUSION
3.0000 mL/kg/h | INTRAVENOUS | Status: DC
Start: 2016-01-25 — End: 2016-01-24

## 2016-01-24 MED ORDER — IOPAMIDOL (ISOVUE-370) INJECTION 76%
INTRAVENOUS | Status: AC
  Filled 2016-01-24: qty 100

## 2016-01-24 MED ORDER — FENTANYL CITRATE (PF) 100 MCG/2ML IJ SOLN
INTRAMUSCULAR | Status: DC | PRN
  Administered 2016-01-24: 50 ug via INTRAVENOUS

## 2016-01-24 MED ORDER — SODIUM CHLORIDE 0.9% FLUSH: 3.0000 mL | INTRAVENOUS | Status: DC | PRN

## 2016-01-24 MED ORDER — SODIUM CHLORIDE 0.9% FLUSH: 3.0000 mL | Freq: Two times a day (BID) | INTRAVENOUS | Status: DC

## 2016-01-24 SURGICAL SUPPLY — 9 items
CATH INFINITI 5FR ANG PIGTAIL (CATHETERS) ×2 IMPLANT
CATH OPTITORQUE TIG 4.0 5F (CATHETERS) ×2 IMPLANT
DEVICE RAD COMP TR BAND LRG (VASCULAR PRODUCTS) ×2 IMPLANT
GLIDESHEATH SLEND A-KIT 6F 22G (SHEATH) ×2 IMPLANT
KIT HEART LEFT (KITS) ×2 IMPLANT
PACK CARDIAC CATHETERIZATION (CUSTOM PROCEDURE TRAY) ×2 IMPLANT
TRANSDUCER W/STOPCOCK (MISCELLANEOUS) ×2 IMPLANT
TUBING CIL FLEX 10 FLL-RA (TUBING) ×2 IMPLANT
WIRE SAFE-T 1.5MM-J .035X260CM (WIRE) ×2 IMPLANT

## 2016-01-24 NOTE — Progress Notes (Signed)
PROGRESS NOTE    Cindy Bradshaw  NT:[id number] DOB: 06-09-1971 DOA: 01/21/2016  PCP: [name], MD   Brief Narrative:  Cindy Bradshaw is a 45 y.o. 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 She presents for right ankle pain which began yesterday. She has not had any injury. Unable to walk due to ankle pain. She suspected it was from chemo as it was pulsating like nerve pain. Pain is severe.  Chest pain starting 1 pm today at rest, pressure like with gradual increase to 9/10. Is nauseated. No dyspnea, diaphoresis or vomiting. Has a h/o acid reflux but controls it with watching her diet. Has not had reflux over the past few wks. She is mildly tachycardic in the ER. She has been given Nitroglycerine x 1, GI cocktail, Zofran and Morphine and pain is improving.    Subjective: Going for cath today. Still has persistent chest pain. Ankle pain improving. Able to walk on the leg.   Assessment & Plan:   Principal Problem:   Chest pain - CTA neg for PE -  asked for cards eval- Troponin neg- EKG unrevealing- Myoview shows moderate sized reversible ischemia in anteroseptal wall extending to the apex - cath today  Active Problems: Allergic reaction - severe itching from CT dye although she was given a prep- Benadryl - resolved    Right ankle pain - - xray unrevaling- pain and tenderness in back of ankle and along achilles tendon - venous duplex>>no DVT - Voltaren gel/ kpad- allergic to Toradol - improving  Breast cancer of upper-outer quadrant of left female breast - finished chemo which resulted in shrinking the tumor from 5.1 to 2.9 cm and is due to have lumpectomy   DVT prophylaxis: Heparin Code Status: full code Family Communication: husband Disposition Plan: per cardiology  Consultants:   cardiology Procedures:   Venous duplex right leg Antimicrobials:  Anti-infectives    None       Objective: Filed Vitals:   01/24/16 1750 01/24/16 1755 01/24/16 1800 01/24/16 1805  BP: 153/78 158/78 147/75 161/77  Pulse: 96 101 102 100  Temp:      TempSrc:      Resp: 16 22 20 20   SpO2: 97% 95% 95% 95%    Intake/Output Summary (Last 24 hours) at 01/24/16 1830 Last data filed at 01/24/16 1626  Gross per 24 hour  Intake      0 ml  Output    400 ml  Net   -400 ml   There were no vitals filed for this visit.  Examination: General exam: Appears comfortable  HEENT: PERRLA, oral mucosa moist, no sclera icterus or thrush Respiratory system: Clear to auscultation. Respiratory effort normal. Cardiovascular system: S1 & S2 heard, RRR.  No murmurs  Gastrointestinal system: Abdomen soft, non-tender, nondistended. Normal bowel sound. No organomegaly Central nervous system: Alert and oriented. No focal neurological deficits. Extremities: No cyanosis, clubbing or edema- tender in right ankle and lower calf with pain on ROM Skin: No rashes or ulcers Psychiatry:  Mood & affect appropriate.     Data Reviewed: I have personally reviewed following labs and imaging studies  CBC:  Recent Labs Lab 01/21/16 1627 01/22/16 0637 01/23/16 0609 01/24/16 0330  WBC 5.4 5.8 8.4 5.9  HGB 12.7 11.9* 10.9* 11.6*  HCT 37.8 35.9* 34.4* 36.3  MCV 89.6 90.4 94.2 93.6  PLT 416* 384 369 [id number]   Basic Metabolic Panel:  Recent Labs Lab 01/21/16 1627  NA 142  K 3.6  CL 110  CO2 25  GLUCOSE 102*  BUN 14  CREATININE 0.84  CALCIUM 10.5*   GFR: Estimated Creatinine Clearance: 104.2 mL/min (by C-G formula based on Cr of 0.84). Liver Function Tests: No results for input(s): AST, ALT, ALKPHOS, BILITOT, PROT, ALBUMIN in the last 168 hours. No results for input(s): LIPASE, AMYLASE in the last 168 hours. No results for input(s): AMMONIA in the last 168 hours. Coagulation Profile:  Recent Labs Lab 01/21/16 1627  INR 1.02   Cardiac Enzymes:  Recent Labs Lab 01/21/16 1903 01/22/16 0026 01/22/16 0637 01/23/16 2235  01/24/16 0410  TROPONINI 0.04* 0.04* 0.03 0.05* 0.04*   BNP (last 3 results) No results for input(s): PROBNP in the last 8760 hours. HbA1C: No results for input(s): HGBA1C in the last 72 hours. CBG: No results for input(s): GLUCAP in the last 168 hours. Lipid Profile: No results for input(s): CHOL, HDL, LDLCALC, TRIG, CHOLHDL, LDLDIRECT in the last 72 hours. Thyroid Function Tests: No results for input(s): TSH, T4TOTAL, FREET4, T3FREE, THYROIDAB in the last 72 hours. Anemia Panel: No results for input(s): VITAMINB12, FOLATE, FERRITIN, TIBC, IRON, RETICCTPCT in the last 72 hours. Urine analysis:    Component Value Date/Time   COLORURINE YELLOW 01/23/2015 0930   APPEARANCEUR CLEAR 01/23/2015 0930   LABSPEC 1.020 01/04/2016 1512   LABSPEC 1.010 01/23/2015 0930   PHURINE 6.0 01/04/2016 1512   PHURINE 5.5 01/23/2015 0930   GLUCOSEU Negative 01/04/2016 1512   GLUCOSEU NEGATIVE 01/23/2015 0930   HGBUR Moderate 01/04/2016 1512   HGBUR SMALL* 01/23/2015 0930   BILIRUBINUR Negative 01/04/2016 1512   BILIRUBINUR NEGATIVE 01/23/2015 0930   KETONESUR Negative 01/04/2016 1512   KETONESUR NEGATIVE 01/23/2015 0930   PROTEINUR < 30 01/04/2016 1512   PROTEINUR NEGATIVE 01/23/2015 0930   UROBILINOGEN 0.2 01/04/2016 1512   UROBILINOGEN 0.2 01/23/2015 0930   NITRITE Negative 01/04/2016 1512   NITRITE NEGATIVE 01/23/2015 0930   LEUKOCYTESUR Negative 01/04/2016 1512   LEUKOCYTESUR NEGATIVE 01/23/2015 0930   Sepsis Labs: @LABRCNTIP (procalcitonin:4,lacticidven:4) )No results found for this or any previous visit (from the past 240 hour(s)).       Radiology Studies: Nm Myocar Multi W/spect W/wall Motion / Ef  01/23/2016  CLINICAL DATA:  45 year old female with chest pain EXAM: MYOCARDIAL IMAGING WITH SPECT (REST AND PHARMACOLOGIC-STRESS) GATED LEFT VENTRICULAR WALL MOTION STUDY LEFT VENTRICULAR EJECTION FRACTION TECHNIQUE: Standard myocardial SPECT imaging was performed after resting  intravenous injection of 10 mCi Tc-80m tetrofosmin. Subsequently, intravenous infusion of Lexiscan was performed under the supervision of the Cardiology staff. At peak effect of the drug, 30 mCi Tc-36m tetrofosmin was injected intravenously and standard myocardial SPECT imaging was performed. Quantitative gated imaging was also performed to evaluate left ventricular wall motion, and estimate left ventricular ejection fraction. COMPARISON:  CT PE study 01/22/2016 FINDINGS: Perfusion: Moderate region of decreased radiotracer uptake following stress imaging in the anteroseptal wall of the mid ventricle extending to the apex and true apex. Wall Motion: Normal left ventricular wall motion. No left ventricular dilation. Left Ventricular Ejection Fraction: 54 % End diastolic volume 0000000 ml End systolic volume 50 ml IMPRESSION: 1. Positive for moderate size region of reversible ischemia in the anteroseptal wall of the mid ventricle extending through the apex to the true apex. 2. Normal left ventricular wall motion. 3. Left ventricular ejection fraction 54% 4. Non invasive risk stratification*: Intermediate *2012 Appropriate Use Criteria for Coronary Revascularization Focused Update: J Am Coll Cardiol. [id number]. http://[email or web address] Electronically Signed  By: Jacqulynn Cadet M.D.   On: 01/23/2016 16:44      Scheduled Meds: . [MAR Hold] aspirin EC  81 mg Oral Daily  . [MAR Hold] diclofenac sodium  2 g Topical QID  . [MAR Hold] heparin subcutaneous  5,000 Units Subcutaneous Q8H  . [MAR Hold] heparin lock flush  500 Units Intracatheter Q30 days  . [MAR Hold] pantoprazole  40 mg Oral Daily  . [START ON 01/25/2016] predniSONE  1 mg Oral Pre-Cath  . [MAR Hold] regadenoson  0.4 mg Intravenous Once  . sodium chloride flush  3 mL Intravenous Q12H   Continuous Infusions: . [START ON 01/25/2016] sodium chloride     Followed by  . [START ON 01/25/2016] sodium chloride      . sodium chloride 3 mL/kg/hr (01/24/16 1815)     LOS: 3 days    Time spent in minutes: 13    [place], MD [facility] Pager: [email or web address] Password [email or web address] 01/24/2016, 6:30 PM

## 2016-01-24 NOTE — Progress Notes (Signed)
Pt. Received laptop from security this pm at 2215.

## 2016-01-24 NOTE — Progress Notes (Signed)
TR BAND REMOVAL  LOCATION:  Rt   radial  DEFLATED PER PROTOCOL:   Yes  TIME BAND OFF / DRESSING APPLIED:    19:00  SITE UPON ARRIVAL:    Level 0  SITE AFTER BAND REMOVAL:    Level 0   CIRCULATION SENSATION AND MOVEMENT:    Within Normal Limits : Yes COMMENTS:   Rt hand is warm, Rt. Radial pulse present, insertion site is sore. No bruising noted.

## 2016-01-24 NOTE — H&P (View-Only) (Signed)
Patient Name: Cindy Bradshaw. [name] Date of Encounter: 01/24/2016  Principal Problem:   Chest pain Active Problems:   Breast cancer of upper-outer quadrant of left female breast (HCC)   Normocytic normochromic anemia   Chemotherapy-induced peripheral neuropathy (HCC)   Right ankle pain   Primary Cardiologist: New  Patient Profile: 45 yo female w/ hx breast CA, asthma, GERD, DJD, was admitted 05/20 for chest pain>>MV abnl>>cath planned.  SUBJECTIVE: Still with 5/10 chest pain, worse with deep inspiration and palpation. DOE whenever she gets up an moves around.  OBJECTIVE Filed Vitals:   01/23/16 0604 01/23/16 1300 01/23/16 2238 01/24/16 0412  BP: 143/88 128/74 137/90 137/77  Pulse: 94 82 98 57  Temp: 98.4 F (36.9 C) 98.5 F (36.9 C) 98.8 F (37.1 C) 97.8 F (36.6 C)  TempSrc: Oral Oral Oral Oral  Resp: 16 18 20 16   SpO2: 98% 100% 99% 98%    Intake/Output Summary (Last 24 hours) at 01/24/16 [id number] Last data filed at 01/23/16 1532  Gross per 24 hour  Intake      0 ml  Output      2 ml  Net     -2 ml   There were no vitals filed for this visit.  PHYSICAL EXAM General: Well developed, well nourished, female in no acute distress. Head: Normocephalic, atraumatic.  Neck: Supple without bruits, JVD not well seen, does not appear elevated. Lungs:  Resp regular and unlabored, decreased BS bases. Heart: RRR, S1, S2, no S3, S4, or murmur; no rub. Abdomen: Soft, non-tender, non-distended, BS + x 4.  Extremities: No clubbing, cyanosis, edema.  Neuro: Alert and oriented X 3. Moves all extremities spontaneously. Psych: Normal affect.  LABS: CBC: Recent Labs  01/23/16 0609 01/24/16 0330  WBC 8.4 5.9  HGB 10.9* 11.6*  HCT 34.4* 36.3  MCV 94.2 93.6  PLT 369 368   INR: Recent Labs  01/21/16 1627  INR [id number]   Basic Metabolic Panel: Recent Labs  01/21/16 1627  NA 142  K 3.6  CL 110  CO2 25  GLUCOSE 102*  BUN 14  CREATININE 0.84  CALCIUM 10.5*   Cardiac  Enzymes: Recent Labs  01/22/16 0637 01/23/16 2235 01/24/16 0410  TROPONINI 0.03 0.05* 0.04*    Recent Labs  01/21/16 1633  TROPIPOC 0.03   BNP:  B NATRIURETIC PEPTIDE  Date/Time Value Ref Range Status  01/21/2016 04:27 PM 10.9 0.0 - 100.0 pg/mL Final   TELE:  SR, ST       Radiology/Studies: Ct Angio Chest Pe W/cm &/or Wo Cm 01/22/2016  CLINICAL DATA:  Centralized chest pain shortness of breath. Evaluate for pulmonary embolism. EXAM: CT ANGIOGRAPHY CHEST WITH CONTRAST TECHNIQUE: Multidetector CT imaging of the chest was performed using the standard protocol during bolus administration of intravenous contrast. Multiplanar CT image reconstructions and MIPs were obtained to evaluate the vascular anatomy. CONTRAST:  100 cc Isovue 370 (patient received 13 hour premedication steroid prep and tolerated intravenous contrast without incident) COMPARISON:  Chest CT - 12/28/2015; chest radiograph - 01/21/2016; 09/16/2015 FINDINGS: Vascular Findings: There is adequate opacification of the pulmonary arterial system with the main pulmonary artery measuring 253 Hounsfield units. There are no discrete filling defects within the pulmonary arterial tree to the level of the bilateral subsegmental pulmonary arteries. Evaluation of the distal subsegmental pulmonary arteries is degraded secondary to a combination of suboptimal vessel opacification, patient body habitus and respiratory artifact. Normal caliber the main pulmonary artery. Cardiomegaly. No pericardial effusion. Normal  caliber the thoracic aorta. No definite thoracic aortic dissection on this nongated examination. Bovine configuration of the aortic arch. The left vertebral artery is incidentally noted to arise directly from the aortic arch. Review of the MIP images confirms the above findings. ---------------------------------------------------------------------------------- Nonvascular Findings: Evaluation of the pulmonary parenchyma is degraded  secondary to a combination of patient body habitus and respiratory artifact. Mediastinum/Lymph Nodes: No mediastinal, hilar axillary lymphadenopathy. Lungs/Pleura: Bibasilar dependent consolidative subsegmental atelectasis. Minimal dependent subpleural ground-glass atelectasis. No discrete focal airspace opacities. No pleural effusion or pneumothorax. The central pulmonary airways appear widely patent. No discrete pulmonary nodules given limitation of the examination. Upper abdomen: Limited early arterial phase evaluation of the upper abdomen demonstrates sequela mild dilatation of the common bile duct and centralized intrahepatic biliary duct dilatation, likely the sequela post cholecystectomy state and biliary reservoir phenomenon. Musculoskeletal: No acute or aggressive osseous abnormalities. Stigmata of DISH throughout the thoracic spine. Grossly unchanged approximately 2.3 x 1.1 cm hypo attenuating nodule within the inferior lateral aspect the left lobe of the thyroid (image 7, series 13). IMPRESSION: 1. Bibasilar atelectasis without acute cardiopulmonary disease. Specifically, no evidence of pulmonary embolism to the level of the bilateral subsegmental pulmonary arteries. 2. Cardiomegaly. 3. Grossly unchanged suspected approximately 2.3 cm hypo attenuating nodule within the inferior aspect the left lobe of the thyroid. Further evaluation with dedicated nonemergent thyroid ultrasound could be performed as indicated. Electronically Signed   By: Sandi Mariscal M.D.   On: 01/22/2016 10:08   Nm Myocar Multi W/spect W/wall Motion / Ef 01/23/2016  CLINICAL DATA:  44 year old female with chest pain EXAM: MYOCARDIAL IMAGING WITH SPECT (REST AND PHARMACOLOGIC-STRESS) GATED LEFT VENTRICULAR WALL MOTION STUDY LEFT VENTRICULAR EJECTION FRACTION TECHNIQUE: Standard myocardial SPECT imaging was performed after resting intravenous injection of 10 mCi Tc-54m tetrofosmin. Subsequently, intravenous infusion of Lexiscan was  performed under the supervision of the Cardiology staff. At peak effect of the drug, 30 mCi Tc-14m tetrofosmin was injected intravenously and standard myocardial SPECT imaging was performed. Quantitative gated imaging was also performed to evaluate left ventricular wall motion, and estimate left ventricular ejection fraction. COMPARISON:  CT PE study 01/22/2016 FINDINGS: Perfusion: Moderate region of decreased radiotracer uptake following stress imaging in the anteroseptal wall of the mid ventricle extending to the apex and true apex. Wall Motion: Normal left ventricular wall motion. No left ventricular dilation. Left Ventricular Ejection Fraction: 54 % End diastolic volume 0000000 ml End systolic volume 50 ml IMPRESSION: 1. Positive for moderate size region of reversible ischemia in the anteroseptal wall of the mid ventricle extending through the apex to the true apex. 2. Normal left ventricular wall motion. 3. Left ventricular ejection fraction 54% 4. Non invasive risk stratification*: Intermediate *2012 Appropriate Use Criteria for Coronary Revascularization Focused Update: J Am Coll Cardiol. [id number]. [email or web address] Electronically Signed   By: Jacqulynn Cadet M.D.   On: 01/23/2016 16:44     Current Medications:  . aspirin EC  81 mg Oral Daily  . diclofenac sodium  2 g Topical QID  . heparin subcutaneous  5,000 Units Subcutaneous Q8H  . heparin lock flush  500 Units Intracatheter Q30 days  . pantoprazole  40 mg Oral Daily  . regadenoson  0.4 mg Intravenous Once      ASSESSMENT AND PLAN: Principal Problem:   Chest pain - atypical and prolonged with no significant ez elevation or ECG changes - However, MV was abnl with ischemia (?false positive) and she has no hx CAD - Therefore, cardiac cath indicated to  further define her anatomy - The risks and benefits of a cardiac catheterization including, but not limited to, death, stroke, MI, kidney  damage and bleeding were discussed with the patient who indicates understanding and agrees to proceed.  - scheduled for 3 pm today, orders written.  Active Problems:   Breast cancer of upper-outer quadrant of left female breast ([place]) - F/u oncology Neoadjuvant chemotherapy with dose dense Adriamycin and Cytoxan 4 followed by Taxol and carboplatin weekly 12  - She is being referred for lumpectomy as MRI showed nice response with tumor shrinkage. - Important for preop to r/o CAD given chest pain     Normocytic normochromic anemia - H&H are almost normal, per IM    Chemotherapy-induced peripheral neuropathy (HCC)   Right ankle pain - No DVT on dopplers. Good distal pulses. She denies any injury. - Thought to be related to chemo - per IM  Signed, Rosaria Ferries , PA-C 8:07 AM 01/24/2016

## 2016-01-24 NOTE — Progress Notes (Signed)
Patient Name: Cindy Bradshaw. [name] Date of Encounter: 01/24/2016  Principal Problem:   Chest pain Active Problems:   Breast cancer of upper-outer quadrant of left female breast (HCC)   Normocytic normochromic anemia   Chemotherapy-induced peripheral neuropathy (HCC)   Right ankle pain   Primary Cardiologist: New  Patient Profile: 45 yo female w/ hx breast CA, asthma, GERD, DJD, was admitted 05/20 for chest pain>>MV abnl>>cath planned.  SUBJECTIVE: Still with 5/10 chest pain, worse with deep inspiration and palpation. DOE whenever she gets up an moves around.  OBJECTIVE Filed Vitals:   01/23/16 0604 01/23/16 1300 01/23/16 2238 01/24/16 0412  BP: 143/88 128/74 137/90 137/77  Pulse: 94 82 98 57  Temp: 98.4 F (36.9 C) 98.5 F (36.9 C) 98.8 F (37.1 C) 97.8 F (36.6 C)  TempSrc: Oral Oral Oral Oral  Resp: 16 18 20 16   SpO2: 98% 100% 99% 98%    Intake/Output Summary (Last 24 hours) at 01/24/16 [id number] Last data filed at 01/23/16 1532  Gross per 24 hour  Intake      0 ml  Output      2 ml  Net     -2 ml   There were no vitals filed for this visit.  PHYSICAL EXAM General: Well developed, well nourished, female in no acute distress. Head: Normocephalic, atraumatic.  Neck: Supple without bruits, JVD not well seen, does not appear elevated. Lungs:  Resp regular and unlabored, decreased BS bases. Heart: RRR, S1, S2, no S3, S4, or murmur; no rub. Abdomen: Soft, non-tender, non-distended, BS + x 4.  Extremities: No clubbing, cyanosis, edema.  Neuro: Alert and oriented X 3. Moves all extremities spontaneously. Psych: Normal affect.  LABS: CBC: Recent Labs  01/23/16 0609 01/24/16 0330  WBC 8.4 5.9  HGB 10.9* 11.6*  HCT 34.4* 36.3  MCV 94.2 93.6  PLT 369 368   INR: Recent Labs  01/21/16 1627  INR [id number]   Basic Metabolic Panel: Recent Labs  01/21/16 1627  NA 142  K 3.6  CL 110  CO2 25  GLUCOSE 102*  BUN 14  CREATININE 0.84  CALCIUM 10.5*   Cardiac  Enzymes: Recent Labs  01/22/16 0637 01/23/16 2235 01/24/16 0410  TROPONINI 0.03 0.05* 0.04*    Recent Labs  01/21/16 1633  TROPIPOC 0.03   BNP:  B NATRIURETIC PEPTIDE  Date/Time Value Ref Range Status  01/21/2016 04:27 PM 10.9 0.0 - 100.0 pg/mL Final   TELE:  SR, ST       Radiology/Studies: Ct Angio Chest Pe W/cm &/or Wo Cm 01/22/2016  CLINICAL DATA:  Centralized chest pain shortness of breath. Evaluate for pulmonary embolism. EXAM: CT ANGIOGRAPHY CHEST WITH CONTRAST TECHNIQUE: Multidetector CT imaging of the chest was performed using the standard protocol during bolus administration of intravenous contrast. Multiplanar CT image reconstructions and MIPs were obtained to evaluate the vascular anatomy. CONTRAST:  100 cc Isovue 370 (patient received 13 hour premedication steroid prep and tolerated intravenous contrast without incident) COMPARISON:  Chest CT - 12/28/2015; chest radiograph - 01/21/2016; 09/16/2015 FINDINGS: Vascular Findings: There is adequate opacification of the pulmonary arterial system with the main pulmonary artery measuring 253 Hounsfield units. There are no discrete filling defects within the pulmonary arterial tree to the level of the bilateral subsegmental pulmonary arteries. Evaluation of the distal subsegmental pulmonary arteries is degraded secondary to a combination of suboptimal vessel opacification, patient body habitus and respiratory artifact. Normal caliber the main pulmonary artery. Cardiomegaly. No pericardial effusion. Normal  caliber the thoracic aorta. No definite thoracic aortic dissection on this nongated examination. Bovine configuration of the aortic arch. The left vertebral artery is incidentally noted to arise directly from the aortic arch. Review of the MIP images confirms the above findings. ---------------------------------------------------------------------------------- Nonvascular Findings: Evaluation of the pulmonary parenchyma is degraded  secondary to a combination of patient body habitus and respiratory artifact. Mediastinum/Lymph Nodes: No mediastinal, hilar axillary lymphadenopathy. Lungs/Pleura: Bibasilar dependent consolidative subsegmental atelectasis. Minimal dependent subpleural ground-glass atelectasis. No discrete focal airspace opacities. No pleural effusion or pneumothorax. The central pulmonary airways appear widely patent. No discrete pulmonary nodules given limitation of the examination. Upper abdomen: Limited early arterial phase evaluation of the upper abdomen demonstrates sequela mild dilatation of the common bile duct and centralized intrahepatic biliary duct dilatation, likely the sequela post cholecystectomy state and biliary reservoir phenomenon. Musculoskeletal: No acute or aggressive osseous abnormalities. Stigmata of DISH throughout the thoracic spine. Grossly unchanged approximately 2.3 x 1.1 cm hypo attenuating nodule within the inferior lateral aspect the left lobe of the thyroid (image 7, series 13). IMPRESSION: 1. Bibasilar atelectasis without acute cardiopulmonary disease. Specifically, no evidence of pulmonary embolism to the level of the bilateral subsegmental pulmonary arteries. 2. Cardiomegaly. 3. Grossly unchanged suspected approximately 2.3 cm hypo attenuating nodule within the inferior aspect the left lobe of the thyroid. Further evaluation with dedicated nonemergent thyroid ultrasound could be performed as indicated. Electronically Signed   By: Sandi Mariscal M.D.   On: 01/22/2016 10:08   Nm Myocar Multi W/spect W/wall Motion / Ef 01/23/2016  CLINICAL DATA:  45 year old female with chest pain EXAM: MYOCARDIAL IMAGING WITH SPECT (REST AND PHARMACOLOGIC-STRESS) GATED LEFT VENTRICULAR WALL MOTION STUDY LEFT VENTRICULAR EJECTION FRACTION TECHNIQUE: Standard myocardial SPECT imaging was performed after resting intravenous injection of 10 mCi Tc-80m tetrofosmin. Subsequently, intravenous infusion of Lexiscan was  performed under the supervision of the Cardiology staff. At peak effect of the drug, 30 mCi Tc-61m tetrofosmin was injected intravenously and standard myocardial SPECT imaging was performed. Quantitative gated imaging was also performed to evaluate left ventricular wall motion, and estimate left ventricular ejection fraction. COMPARISON:  CT PE study 01/22/2016 FINDINGS: Perfusion: Moderate region of decreased radiotracer uptake following stress imaging in the anteroseptal wall of the mid ventricle extending to the apex and true apex. Wall Motion: Normal left ventricular wall motion. No left ventricular dilation. Left Ventricular Ejection Fraction: 54 % End diastolic volume 0000000 ml End systolic volume 50 ml IMPRESSION: 1. Positive for moderate size region of reversible ischemia in the anteroseptal wall of the mid ventricle extending through the apex to the true apex. 2. Normal left ventricular wall motion. 3. Left ventricular ejection fraction 54% 4. Non invasive risk stratification*: Intermediate *2012 Appropriate Use Criteria for Coronary Revascularization Focused Update: J Am Coll Cardiol. [id number]. [email or web address] Electronically Signed   By: Jacqulynn Cadet M.D.   On: 01/23/2016 16:44     Current Medications:  . aspirin EC  81 mg Oral Daily  . diclofenac sodium  2 g Topical QID  . heparin subcutaneous  5,000 Units Subcutaneous Q8H  . heparin lock flush  500 Units Intracatheter Q30 days  . pantoprazole  40 mg Oral Daily  . regadenoson  0.4 mg Intravenous Once      ASSESSMENT AND PLAN: Principal Problem:   Chest pain - atypical and prolonged with no significant ez elevation or ECG changes - However, MV was abnl with ischemia (?false positive) and she has no hx CAD - Therefore, cardiac cath indicated to  further define her anatomy - The risks and benefits of a cardiac catheterization including, but not limited to, death, stroke, MI, kidney  damage and bleeding were discussed with the patient who indicates understanding and agrees to proceed.  - scheduled for 3 pm today, orders written.  Active Problems:   Breast cancer of upper-outer quadrant of left female breast ([place]) - F/u oncology Neoadjuvant chemotherapy with dose dense Adriamycin and Cytoxan 4 followed by Taxol and carboplatin weekly 12  - She is being referred for lumpectomy as MRI showed nice response with tumor shrinkage. - Important for preop to r/o CAD given chest pain     Normocytic normochromic anemia - H&H are almost normal, per IM    Chemotherapy-induced peripheral neuropathy (HCC)   Right ankle pain - No DVT on dopplers. Good distal pulses. She denies any injury. - Thought to be related to chemo - per IM  Signed, Rosaria Ferries , PA-C 8:07 AM 01/24/2016

## 2016-01-24 NOTE — Progress Notes (Signed)
Pt transported to another campus via [place] for procedure.  Per patient request, pt laptop and black laptop bag stored in security while she is off campus.  Laptop transported and placed in security services possession by Phill Mutter.

## 2016-01-24 NOTE — Interval H&P Note (Signed)
History and Physical Interval Note:  01/24/2016 4:15 PM  Cindy Bradshaw  has presented today for surgery, with the diagnosis of cp/angina with abnormal nuclear stress test  The various methods of treatment have been discussed with the patient and family. After consideration of risks, benefits and other options for treatment, the patient has consented to  Procedure(s): Left Heart Cath and Coronary Angiography ([name]) as a surgical intervention .  The patient's history has been reviewed, patient examined, no change in status, stable for surgery.  I have reviewed the patient's chart and labs.  Questions were answered to the patient's satisfaction.    Cath Lab Visit (complete for each Cath Lab visit)  Clinical Evaluation Leading to the Procedure:   ACS: Yes.    Non-ACS:    Anginal Classification: CCS III  Anti-ischemic medical therapy: No Therapy  Non-Invasive Test Results: Intermediate-risk stress test findings: cardiac mortality 1-3%/year  Prior CABG: No previous CABG   TIMI SCORE  Patient Information:  TIMI Score is 3  UA/NSTEMI and intermediate-risk features (e.g., TIMI score 3?4) for short-term risk of death or nonfatal MI  Revascularization of the presumed culprit artery   A (9)  Indication: 10; Score: 9     Glenetta Hew, MD

## 2016-01-25 ENCOUNTER — Other Ambulatory Visit: Payer: Self-pay

## 2016-01-25 ENCOUNTER — Encounter (HOSPITAL_COMMUNITY): Payer: Self-pay | Admitting: Cardiology

## 2016-01-25 DIAGNOSIS — T451X5A Adverse effect of antineoplastic and immunosuppressive drugs, initial encounter: Secondary | ICD-10-CM

## 2016-01-25 DIAGNOSIS — G620 Drug-induced polyneuropathy: Secondary | ICD-10-CM | POA: Diagnosis not present

## 2016-01-25 DIAGNOSIS — R61 Generalized hyperhidrosis: Secondary | ICD-10-CM | POA: Diagnosis not present

## 2016-01-25 DIAGNOSIS — Z9221 Personal history of antineoplastic chemotherapy: Secondary | ICD-10-CM | POA: Diagnosis not present

## 2016-01-25 DIAGNOSIS — R42 Dizziness and giddiness: Secondary | ICD-10-CM

## 2016-01-25 DIAGNOSIS — Z853 Personal history of malignant neoplasm of breast: Secondary | ICD-10-CM | POA: Diagnosis not present

## 2016-01-25 DIAGNOSIS — R079 Chest pain, unspecified: Secondary | ICD-10-CM | POA: Diagnosis not present

## 2016-01-25 DIAGNOSIS — I200 Unstable angina: Secondary | ICD-10-CM | POA: Diagnosis not present

## 2016-01-25 DIAGNOSIS — M25571 Pain in right ankle and joints of right foot: Secondary | ICD-10-CM | POA: Diagnosis not present

## 2016-01-25 DIAGNOSIS — R0789 Other chest pain: Secondary | ICD-10-CM | POA: Diagnosis not present

## 2016-01-25 DIAGNOSIS — R931 Abnormal findings on diagnostic imaging of heart and coronary circulation: Secondary | ICD-10-CM | POA: Diagnosis not present

## 2016-01-25 LAB — CBC
HEMATOCRIT: 34.6 % — AB (ref 36.0–46.0)
HEMOGLOBIN: 11.3 g/dL — AB (ref 12.0–15.0)
MCH: 29.5 pg (ref 26.0–34.0)
MCHC: 32.7 g/dL (ref 30.0–36.0)
MCV: 90.3 fL (ref 78.0–100.0)
Platelets: 363 10*3/uL (ref 150–400)
RBC: 3.83 MIL/uL — ABNORMAL LOW (ref 3.87–5.11)
RDW: 15 % (ref 11.5–15.5)
WBC: 12 10*3/uL — ABNORMAL HIGH (ref 4.0–10.5)

## 2016-01-25 LAB — CREATININE, SERUM: Creatinine, Ser: 0.67 mg/dL (ref 0.44–1.00)

## 2016-01-25 MED ORDER — HEPARIN SODIUM (PORCINE) 5000 UNIT/ML IJ SOLN
5000.0000 [IU] | Freq: Three times a day (TID) | INTRAMUSCULAR | Status: DC
  Administered 2016-01-25: 5000 [IU] via SUBCUTANEOUS
  Filled 2016-01-25 (×4): qty 1

## 2016-01-25 MED ORDER — SODIUM CHLORIDE 0.9% FLUSH: 3.0000 mL | INTRAVENOUS | Status: DC | PRN

## 2016-01-25 MED ORDER — HEPARIN SOD (PORK) LOCK FLUSH 100 UNIT/ML IV SOLN
500.0000 [IU] | INTRAVENOUS | Status: AC | PRN
  Administered 2016-01-25: 500 [IU]

## 2016-01-25 MED ORDER — TRAMADOL HCL 50 MG PO TABS
50.0000 mg | ORAL_TABLET | Freq: Once | ORAL | Status: AC
  Administered 2016-01-25: 50 mg via ORAL
  Filled 2016-01-25: qty 1

## 2016-01-25 MED ORDER — SODIUM CHLORIDE 0.9% FLUSH: 3.0000 mL | Freq: Two times a day (BID) | INTRAVENOUS | Status: DC

## 2016-01-25 MED ORDER — SODIUM CHLORIDE 0.9 % IV SOLN: 250.0000 mL | INTRAVENOUS | Status: DC | PRN

## 2016-01-25 MED FILL — Fentanyl Citrate Preservative Free (PF) Inj 100 MCG/2ML: INTRAMUSCULAR | Qty: 4 | Status: AC

## 2016-01-25 NOTE — Progress Notes (Signed)
Discharge instructions reviewed with patient utilizing teach back method. No questions at this time. Patient discharged to home. 

## 2016-01-25 NOTE — Progress Notes (Signed)
Patient Name: Cindy Bradshaw. [name] Date of Encounter: 01/25/2016  Principal Problem:   Unstable angina [facility]) Active Problems:   Breast cancer of upper-outer quadrant of left female breast (HCC)   Normocytic normochromic anemia   Chemotherapy-induced peripheral neuropathy (HCC)   Right ankle pain   Pain in the chest   SOB (shortness of breath)   Abnormal nuclear stress test   Primary Cardiologist: New  Patient Profile: 45 yo female w/ hx breast CA, asthma, GERD, DJD, was admitted 05/20 for chest pain>>MV abnl>>cath 05/23 w/ nl cors.  SUBJECTIVE: No more chest pain, no SOB, hopes for dc  OBJECTIVE Filed Vitals:   01/24/16 1935 01/24/16 2038 01/24/16 2200 01/25/16 0600  BP: 151/80 130/79 145/86 117/59  Pulse: 102 99 114 84  Temp:  99.1 F (37.3 C) 98.6 F (37 C) 98.5 F (36.9 C)  TempSrc:  Oral Oral Oral  Resp: 17 18 18 18   SpO2: 97% 98% 98% 100%    Intake/Output Summary (Last 24 hours) at 01/25/16 1033 Last data filed at 01/24/16 2100  Gross per 24 hour  Intake    240 ml  Output    400 ml  Net   -160 ml   There were no vitals filed for this visit.  PHYSICAL EXAM General: Well developed, well nourished, female in no acute distress. Head: Normocephalic, atraumatic.  Neck: Supple without bruits, JVD not elevated. Lungs:  Resp regular and unlabored, CTA. Heart: RRR, S1, S2, no S3, S4, or murmur; no rub. Abdomen: Soft, non-tender, non-distended, BS + x 4.  Extremities: No clubbing, cyanosis, edema. R radial cath site without ecchymosis or hematoma Neuro: Alert and oriented X 3. Moves all extremities spontaneously. Psych: Normal affect.  LABS: CBC: Recent Labs  01/24/16 0330 01/25/16 0522  WBC 5.9 12.0*  HGB 11.6* 11.3*  HCT 36.3 34.6*  MCV 93.6 90.3  PLT 368 [id number]   Basic Metabolic Panel: Recent Labs  01/25/16 0522  CREATININE 0.67   Cardiac Enzymes: Recent Labs  01/23/16 2235 01/24/16 0410  TROPONINI 0.05* 0.04*   BNP:  B NATRIURETIC PEPTIDE    Date/Time Value Ref Range Status  01/21/2016 04:27 PM 10.9 0.0 - 100.0 pg/mL Final    TELE:   SR, no sig ectopy     Radiology/Studies: Nm Myocar Multi W/spect W/wall Motion / Ef  01/23/2016  CLINICAL DATA:  45 year old female with chest pain EXAM: MYOCARDIAL IMAGING WITH SPECT (REST AND PHARMACOLOGIC-STRESS) GATED LEFT VENTRICULAR WALL MOTION STUDY LEFT VENTRICULAR EJECTION FRACTION TECHNIQUE: Standard myocardial SPECT imaging was performed after resting intravenous injection of 10 mCi Tc-77m tetrofosmin. Subsequently, intravenous infusion of Lexiscan was performed under the supervision of the Cardiology staff. At peak effect of the drug, 30 mCi Tc-42m tetrofosmin was injected intravenously and standard myocardial SPECT imaging was performed. Quantitative gated imaging was also performed to evaluate left ventricular wall motion, and estimate left ventricular ejection fraction. COMPARISON:  CT PE study 01/22/2016 FINDINGS: Perfusion: Moderate region of decreased radiotracer uptake following stress imaging in the anteroseptal wall of the mid ventricle extending to the apex and true apex. Wall Motion: Normal left ventricular wall motion. No left ventricular dilation. Left Ventricular Ejection Fraction: 54 % End diastolic volume 0000000 ml End systolic volume 50 ml IMPRESSION: 1. Positive for moderate size region of reversible ischemia in the anteroseptal wall of the mid ventricle extending through the apex to the true apex. 2. Normal left ventricular wall motion. 3. Left ventricular ejection fraction 54% 4. Non invasive risk  stratification*: Intermediate *2012 Appropriate Use Criteria for Coronary Revascularization Focused Update: J Am Coll Cardiol. [id number]. [email or web address] Electronically Signed   By: Jacqulynn Cadet M.D.   On: 01/23/2016 16:44     Current Medications:  . aspirin EC  81 mg Oral Daily  . diclofenac sodium  2 g Topical QID  . heparin   5,000 Units Subcutaneous Q8H  . heparin lock flush  500 Units Intracatheter Q30 days  . pantoprazole  40 mg Oral Daily  . regadenoson  0.4 mg Intravenous Once  . sodium chloride flush  3 mL Intravenous Q12H      ASSESSMENT AND PLAN: Principal Problem:   Unstable angina (HCC) - ez mild elevation, MV was ?abnl>>cath w/ nl cors. No evidence of spasm - no further cardiac workup planned.  - treatment of noncardiac chest pain per IM - f/u with cards PRN  Otherwise, per IM Active Problems:   Breast cancer of upper-outer quadrant of left female breast (HCC)   Normocytic normochromic anemia   Chemotherapy-induced peripheral neuropathy (HCC)   Right ankle pain   Pain in the chest   SOB (shortness of breath)   Abnormal nuclear stress test   Jonetta Speak , PA-C 10:33 AM 01/25/2016

## 2016-01-25 NOTE — Discharge Summary (Signed)
Physician Discharge Summary  Cindy Bradshaw MZ:[id number] DOB: [birth year] DOA: 01/21/2016  PCP: [name], MD  Admit date: 01/21/2016 Discharge date: 01/25/2016  Time spent: 40 minutes  Recommendations for Outpatient Follow-up:  1. Follow-up with primary care physician within one week.  Discharge Diagnoses:  Principal Problem:   Unstable angina (HCC) Active Problems:   Breast cancer of upper-outer quadrant of left female breast (HCC)   Normocytic normochromic anemia   Chemotherapy-induced peripheral neuropathy (HCC)   Right ankle pain   Pain in the chest   SOB (shortness of breath)   Abnormal nuclear stress test   Discharge Condition: Stable  Diet recommendation: Heart healthy  There were no vitals filed for this visit.  History of present illness:  Cindy Bradshaw is a 45 y.o. 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 She presents for right ankle pain which began yesterday. She has not had any injury. Unable to walk due to ankle pain. She suspected it was from chemo as it was pulsating like nerve pain. Pain is severe.  Chest pain starting 1 pm on the day of admission at rest, pressure like with gradual increase to 9/10. Is nauseated. No dyspnea, diaphoresis or vomiting. Has a h/o acid reflux but controls it with watching her diet. Has not had reflux over the past few wks. She is mildly tachycardic in the ER. She has been given Nitroglycerine x 1, GI cocktail, Zofran and Morphine and pain is improving.   Hospital Course:    Chest pain -CTA neg for PE in the ED. -Cardiology evaluated patient, Myoview stress test showed moderate size reversible ischemia in the anteroseptal wall extending to the apex. -LHC and coronary angiography showed clean coronaries. -The pain is likely noncardiac, instructed to take OTC antacids if pain returns.  Allergic reaction - severe itching from CT dye although she was given a prep-  Benadryl - resolved   Right ankle pain - Xray unrevaling- pain and tenderness in back of ankle and along achilles tendon - venous duplex>>no DVT - Voltaren gel/ kpad- allergic to Toradol - improving  Breast cancer of upper-outer quadrant of left female breast -Finished neoadjuvant chemo which resulted in shrinking the tumor from 5.1 to 2.9 cm and is due to have lumpectomy  Procedures:  Doppler ultrasound done on 01/22/2016 showed no evidence of DVT in the right lower extremity.  Stress test done on 01/23/2016 positive for moderate size region of reversible ischemia in the anteroseptal wall.  Left heart catheter and coronary angiography done by Dr. Ellyn Hack on 01/24/2016 showed normal coronaries.  Consultations:  Cardiology  Discharge Exam: Filed Vitals:   01/24/16 2200 01/25/16 0600  BP: 145/86 117/59  Pulse: 114 84  Temp: 98.6 F (37 C) 98.5 F (36.9 C)  Resp: 18 18  General: Alert and awake, oriented x3, not in any acute distress. HEENT: anicteric sclera, pupils reactive to light and accommodation, EOMI CVS: S1-S2 clear, no murmur rubs or gallops Chest: clear to auscultation bilaterally, no wheezing, rales or rhonchi Abdomen: soft nontender, nondistended, normal bowel sounds, no organomegaly Extremities: no cyanosis, clubbing or edema noted bilaterally Neuro: Cranial nerves II-XII intact, no focal neurological deficits  Discharge Instructions   Discharge Instructions    Diet - low sodium heart healthy    Complete by:  As directed      Increase activity slowly    Complete by:  As directed           Current  Discharge Medication List    CONTINUE these medications which have NOT CHANGED   Details  acetaminophen (TYLENOL) 500 MG tablet Take 1,000 mg by mouth every 6 (six) hours as needed for mild pain, moderate pain or headache.    lidocaine-prilocaine (EMLA) cream Apply to affected area as directed. Refills: 3    PROAIR HFA 108 (90 BASE) MCG/ACT inhaler  Inhale 2 puffs into the lungs every 4 (four) hours as needed. Reported on 12/13/2015 Refills: 3    triamcinolone cream (KENALOG) 0.1 % Apply 1 application topically 2 (two) times daily. Qty: 160 g, Refills: 1       Allergies  Allergen Reactions  . Tape Rash    Itching, red bumps from  Hypafix  Tape.  . Dilaudid [Hydromorphone Hcl] Itching  . Ivp Dye [Iodinated Diagnostic Agents] Itching  . Percocet [Oxycodone-Acetaminophen] Itching  . Toradol [Ketorolac Tromethamine] Itching      The results of significant diagnostics from this hospitalization (including imaging, microbiology, ancillary and laboratory) are listed below for reference.    Significant Diagnostic Studies: Dg Chest 2 View  01/21/2016  CLINICAL DATA:  Chest pain, shortness of breath EXAM: CHEST  2 VIEW COMPARISON:  CTA chest dated 12/28/2015 FINDINGS: Lungs are clear.  No pleural effusion or pneumothorax. The heart is normal in size. Right chest port terminating in the mid SVC. IMPRESSION: No evidence of acute cardiopulmonary disease. Electronically Signed   By: Julian Hy M.D.   On: 01/21/2016 17:28   Dg Ankle Complete Right  01/21/2016  CLINICAL DATA:  Right medial ankle pain EXAM: RIGHT ANKLE - COMPLETE 3+ VIEW COMPARISON:  None. FINDINGS: No fracture or dislocation is seen. The ankle mortise is intact.  Tibiotalar degenerative changes. The base of the fifth metatarsal is unremarkable. Moderate plantar and posterior calcaneal enthesophytes. The visualized soft tissues are unremarkable. IMPRESSION: No fracture or dislocation is seen. Electronically Signed   By: Julian Hy M.D.   On: 01/21/2016 17:27   Ct Angio Chest Pe W/cm &/or Wo Cm  01/22/2016  CLINICAL DATA:  Centralized chest pain shortness of breath. Evaluate for pulmonary embolism. EXAM: CT ANGIOGRAPHY CHEST WITH CONTRAST TECHNIQUE: Multidetector CT imaging of the chest was performed using the standard protocol during bolus administration of intravenous  contrast. Multiplanar CT image reconstructions and MIPs were obtained to evaluate the vascular anatomy. CONTRAST:  100 cc Isovue 370 (patient received 13 hour premedication steroid prep and tolerated intravenous contrast without incident) COMPARISON:  Chest CT - 12/28/2015; chest radiograph - 01/21/2016; 09/16/2015 FINDINGS: Vascular Findings: There is adequate opacification of the pulmonary arterial system with the main pulmonary artery measuring 253 Hounsfield units. There are no discrete filling defects within the pulmonary arterial tree to the level of the bilateral subsegmental pulmonary arteries. Evaluation of the distal subsegmental pulmonary arteries is degraded secondary to a combination of suboptimal vessel opacification, patient body habitus and respiratory artifact. Normal caliber the main pulmonary artery. Cardiomegaly. No pericardial effusion. Normal caliber the thoracic aorta. No definite thoracic aortic dissection on this nongated examination. Bovine configuration of the aortic arch. The left vertebral artery is incidentally noted to arise directly from the aortic arch. Review of the MIP images confirms the above findings. ---------------------------------------------------------------------------------- Nonvascular Findings: Evaluation of the pulmonary parenchyma is degraded secondary to a combination of patient body habitus and respiratory artifact. Mediastinum/Lymph Nodes: No mediastinal, hilar axillary lymphadenopathy. Lungs/Pleura: Bibasilar dependent consolidative subsegmental atelectasis. Minimal dependent subpleural ground-glass atelectasis. No discrete focal airspace opacities. No pleural effusion or pneumothorax. The central  pulmonary airways appear widely patent. No discrete pulmonary nodules given limitation of the examination. Upper abdomen: Limited early arterial phase evaluation of the upper abdomen demonstrates sequela mild dilatation of the common bile duct and centralized  intrahepatic biliary duct dilatation, likely the sequela post cholecystectomy state and biliary reservoir phenomenon. Musculoskeletal: No acute or aggressive osseous abnormalities. Stigmata of DISH throughout the thoracic spine. Grossly unchanged approximately 2.3 x 1.1 cm hypo attenuating nodule within the inferior lateral aspect the left lobe of the thyroid (image 7, series 13). IMPRESSION: 1. Bibasilar atelectasis without acute cardiopulmonary disease. Specifically, no evidence of pulmonary embolism to the level of the bilateral subsegmental pulmonary arteries. 2. Cardiomegaly. 3. Grossly unchanged suspected approximately 2.3 cm hypo attenuating nodule within the inferior aspect the left lobe of the thyroid. Further evaluation with dedicated nonemergent thyroid ultrasound could be performed as indicated. Electronically Signed   By: Sandi Mariscal M.D.   On: 01/22/2016 10:08   Ct Angio Chest Pe W/cm &/or Wo Cm  12/28/2015  CLINICAL DATA:  Bilateral axillary, pleural and back pain for 1 week with shortness of breath. History of breast cancer, chemotherapy in progress. Evaluate for pulmonary embolism. History of contrast allergy. EXAM: CT ANGIOGRAPHY CHEST WITH CONTRAST TECHNIQUE: Multidetector CT imaging of the chest was performed using the standard protocol during bolus administration of intravenous contrast. Multiplanar CT image reconstructions and MIPs were obtained to evaluate the vascular anatomy. CONTRAST:  100 ml Isovue 370. The patient was premedicated according to the 13hour steroid and Benadryl prep due to a history of iodinated contrast allergy. There were no immediate complications. COMPARISON:  Radiographs 09/16/2015.  Abdominal CT 12/08/2015. FINDINGS: Mediastinum: The pulmonary arteries are well opacified with contrast. There is no evidence of acute pulmonary embolism. No significant atherosclerosis. The heart size is normal. There is a small hiatal hernia. There are no enlarged mediastinal, hilar or  axillary lymph nodes. There is low-density along the posterior aspect of the left thyroid lobe, measuring 2.0 x 1.1 cm on image 15. Lungs/Pleura: There is no pleural effusion.The lungs are clear. Upper abdomen: The visualized upper abdomen has a stable appearance. There is mild extrahepatic biliary prominence status post cholecystectomy. Musculoskeletal/Chest wall: No chest wall lesion or acute osseous findings.Mild thoracic spine degenerative changes. Review of the MIP images confirms the above findings. IMPRESSION: 1. No evidence of acute pulmonary embolism or other acute chest process. 2. Small hiatal hernia. 3. Low-density along the posterior aspect of the left thyroid lobe could reflect a thyroid or parathyroid nodule. Consider further evaluation with thyroid ultrasound. Electronically Signed   By: Richardean Sale M.D.   On: 12/28/2015 14:37   Mr Breast Bilateral W Wo Contrast  01/16/2016  CLINICAL DATA:  45 year old patient diagnosed with left breast cancer following ultrasound-guided biopsy of a left breast mass at 1:30 position in December 2016. Pathology revealed a grade 3 invasive ductal carcinoma. Subsequently, breast MRI was performed, showing bilateral areas of enhancement. Both of these areas were biopsied, including linear enhancement in the upper-outer quadrant of the left breast and a 9 mm enhancing mass in the upper-inner quadrant of the right breast. Both of these areas demonstrated pseudoangiomatous stromal hyperplasia ([place]). The patient has been receiving neoadjuvant chemotherapy, recently terminated before beginning the 7th cycle of Taxol, due to severe nausea and vomiting. She presents for follow-up breast MRI after neoadjuvant therapy. LABS:  None performed EXAM: BILATERAL BREAST MRI WITH AND WITHOUT CONTRAST TECHNIQUE: Multiplanar, multisequence MR images of both breasts were obtained prior to and  following the intravenous administration of 20 ml of MultiHance. THREE-DIMENSIONAL MR IMAGE  RENDERING ON INDEPENDENT WORKSTATION: Three-dimensional MR images were rendered by post-processing of the original MR data on an independent workstation. The three-dimensional MR images were interpreted, and findings are reported in the following complete MRI report for this study. Three dimensional images were evaluated at the independent Dyn[employer] workstation COMPARISON:  Breast MRI 09/14/2015 and bilateral breast MRI biopsies 09/28/2015. Bilateral mammogram 08/12/2015. FINDINGS: Breast composition: b. Scattered fibroglandular tissue. Background parenchymal enhancement: Mild Right breast: No mass or abnormal enhancement. Biopsy clip artifact is noted in the anterior third of the upper inner quadrant at the site of prior benign MRI-guided breast biopsy ([place]). Please note there is no mass or suspicious enhancement in the region of the biopsy clip. Left breast: There is an irregular, heterogeneously enhancing mass in the upper-outer quadrant of the left breast, posterior third, corresponding to the biopsy proven malignancy. The mass has decreased in size since the breast MRI of 09/14/2015. Currently, the mass measures 2.9 cm x 1.8 x 1.7 cm (AP by transverse by craniocaudal). Previously, on the breast MRI of 09/14/2015, this mass measured 5.1 x 4.3 x 4.4 cm. A biopsy clip is associated with the the known malignancy. In the anterior third of the upper-outer quadrant of the left breast is a second biopsy clip, at the site of the benign MRI-guided biopsy ([place]). There is no mass or suspicious enhancement in the region of this clip. There are no new areas of concern in the left breast. Lymph nodes: No abnormal appearing lymph nodes. Ancillary findings:  None. IMPRESSION: 1. Positive response to neoadjuvant chemotherapy in this patient with a known upper-outer quadrant left breast invasive ductal carcinoma. The malignant mass now measures 2.9 x 1.8 x 1.7 cm. 2. There are no additional areas of concern in the left breast on  MRI. No suspicious enhancement in the region of the previous MRI-guided biopsy in the anterior upper outer quadrant of the left breast. 3. No MRI evidence of malignancy in the right breast. No suspicious enhancement in the region of the previous MRI guided biopsy in the anterior upper inner quadrant. RECOMMENDATION: Continue treatment planning for known left breast invasive ductal carcinoma. BI-RADS CATEGORY  6: Known biopsy-proven malignancy. Electronically Signed   By: Curlene Dolphin M.D.   On: 01/16/2016 11:06   Nm Myocar Multi W/spect W/wall Motion / Ef  01/23/2016  CLINICAL DATA:  45 year old female with chest pain EXAM: MYOCARDIAL IMAGING WITH SPECT (REST AND PHARMACOLOGIC-STRESS) GATED LEFT VENTRICULAR WALL MOTION STUDY LEFT VENTRICULAR EJECTION FRACTION TECHNIQUE: Standard myocardial SPECT imaging was performed after resting intravenous injection of 10 mCi Tc-88m tetrofosmin. Subsequently, intravenous infusion of Lexiscan was performed under the supervision of the Cardiology staff. At peak effect of the drug, 30 mCi Tc-32m tetrofosmin was injected intravenously and standard myocardial SPECT imaging was performed. Quantitative gated imaging was also performed to evaluate left ventricular wall motion, and estimate left ventricular ejection fraction. COMPARISON:  CT PE study 01/22/2016 FINDINGS: Perfusion: Moderate region of decreased radiotracer uptake following stress imaging in the anteroseptal wall of the mid ventricle extending to the apex and true apex. Wall Motion: Normal left ventricular wall motion. No left ventricular dilation. Left Ventricular Ejection Fraction: 54 % End diastolic volume 0000000 ml End systolic volume 50 ml IMPRESSION: 1. Positive for moderate size region of reversible ischemia in the anteroseptal wall of the mid ventricle extending through the apex to the true apex. 2. Normal left ventricular wall motion. 3.  Left ventricular ejection fraction 54% 4. Non invasive risk stratification*:  Intermediate *2012 Appropriate Use Criteria for Coronary Revascularization Focused Update: J Am Coll Cardiol. [id number]. http://content.airportbarriers.com.aspx?articleid=[email or web address] Electronically Signed   By: Jacqulynn Cadet M.D.   On: 01/23/2016 16:44    Microbiology: No results found for this or any previous visit (from the past 240 hour(s)).   Labs: Basic Metabolic Panel:  Recent Labs Lab 01/21/16 1627 01/25/16 0522  NA 142  --   K 3.6  --   CL 110  --   CO2 25  --   GLUCOSE 102*  --   BUN 14  --   CREATININE 0.84 0.67  CALCIUM 10.5*  --    Liver Function Tests: No results for input(s): AST, ALT, ALKPHOS, BILITOT, PROT, ALBUMIN in the last 168 hours. No results for input(s): LIPASE, AMYLASE in the last 168 hours. No results for input(s): AMMONIA in the last 168 hours. CBC:  Recent Labs Lab 01/21/16 1627 01/22/16 0637 01/23/16 0609 01/24/16 0330 01/25/16 0522  WBC 5.4 5.8 8.4 5.9 12.0*  HGB 12.7 11.9* 10.9* 11.6* 11.3*  HCT 37.8 35.9* 34.4* 36.3 34.6*  MCV 89.6 90.4 94.2 93.6 90.3  PLT 416* 384 369 368 363   Cardiac Enzymes:  Recent Labs Lab 01/21/16 1903 01/22/16 0026 01/22/16 0637 01/23/16 2235 01/24/16 0410  TROPONINI 0.04* 0.04* 0.03 0.05* 0.04*   BNP: BNP (last 3 results)  Recent Labs  01/21/16 1627  BNP 10.9    ProBNP (last 3 results) No results for input(s): PROBNP in the last 8760 hours.  CBG: No results for input(s): GLUCAP in the last 168 hours.     Signed:  Birdie Hopes MD.  [facility] 01/25/2016, 10:44 AM

## 2016-01-26 ENCOUNTER — Ambulatory Visit: Payer: Self-pay

## 2016-01-31 ENCOUNTER — Other Ambulatory Visit: Payer: Self-pay

## 2016-01-31 ENCOUNTER — Ambulatory Visit: Payer: Self-pay

## 2016-02-01 ENCOUNTER — Telehealth: Payer: Self-pay | Admitting: Cardiology

## 2016-02-01 NOTE — Telephone Encounter (Signed)
Spoke to [place] patient called into their office - has rash under both arms and on the breast chest area. Wanted know if anything that may have been used during cardiac cath on 01/24/16. Patient states she has used benadryl cream without success.  RN informed Sunday Spillers- that area that may have been prepped may be radial or brachial  Areas- it should not have caused an issue. Recommend patient contact primary doctor. Sunday Spillers states she will give the information to [place].

## 2016-02-01 NOTE — Telephone Encounter (Signed)
New message    [facility] calling patient c/o rash under both arm after cath procedure 5/23.

## 2016-02-03 ENCOUNTER — Other Ambulatory Visit: Payer: Self-pay | Admitting: General Surgery

## 2016-02-03 DIAGNOSIS — C50412 Malignant neoplasm of upper-outer quadrant of left female breast: Secondary | ICD-10-CM

## 2016-02-06 ENCOUNTER — Encounter (HOSPITAL_BASED_OUTPATIENT_CLINIC_OR_DEPARTMENT_OTHER): Payer: Self-pay | Admitting: *Deleted

## 2016-02-10 ENCOUNTER — Ambulatory Visit
Admission: RE | Admit: 2016-02-10 | Discharge: 2016-02-10 | Disposition: A | Discharge status: Home / Self Care | Payer: Federal, State, Local not specified - PPO | Source: Ambulatory Visit | Attending: General Surgery | Admitting: General Surgery

## 2016-02-10 DIAGNOSIS — C50412 Malignant neoplasm of upper-outer quadrant of left female breast: Secondary | ICD-10-CM

## 2016-02-10 NOTE — Progress Notes (Signed)
Pt given 8 oz carton of boost breeze to drink morning of surgery at 1000 am.  She was instructed to drink no other liquid after midnight. Used teach back and pt voiced understanding,

## 2016-02-13 ENCOUNTER — Encounter (HOSPITAL_BASED_OUTPATIENT_CLINIC_OR_DEPARTMENT_OTHER)
Admission: RE | Disposition: A | Discharge status: Home / Self Care | Payer: Self-pay | Source: Ambulatory Visit | Attending: General Surgery

## 2016-02-13 ENCOUNTER — Ambulatory Visit (HOSPITAL_BASED_OUTPATIENT_CLINIC_OR_DEPARTMENT_OTHER): Payer: Federal, State, Local not specified - PPO | Admitting: Anesthesiology

## 2016-02-13 ENCOUNTER — Encounter (HOSPITAL_BASED_OUTPATIENT_CLINIC_OR_DEPARTMENT_OTHER): Payer: Self-pay

## 2016-02-13 ENCOUNTER — Encounter (HOSPITAL_COMMUNITY): Payer: Self-pay | Admitting: Emergency Medicine

## 2016-02-13 ENCOUNTER — Ambulatory Visit (HOSPITAL_BASED_OUTPATIENT_CLINIC_OR_DEPARTMENT_OTHER)
Admission: RE | Admit: 2016-02-13 | Discharge: 2016-02-13 | Disposition: A | Discharge status: Home / Self Care | Payer: Federal, State, Local not specified - PPO | Source: Ambulatory Visit | Attending: General Surgery | Admitting: General Surgery

## 2016-02-13 ENCOUNTER — Ambulatory Visit
Admission: RE | Admit: 2016-02-13 | Discharge: 2016-02-13 | Disposition: A | Discharge status: Home / Self Care | Payer: Federal, State, Local not specified - PPO | Source: Ambulatory Visit | Attending: General Surgery | Admitting: General Surgery

## 2016-02-13 ENCOUNTER — Emergency Department (HOSPITAL_COMMUNITY)
Admission: EM | Admit: 2016-02-13 | Discharge: 2016-02-13 | Disposition: A | Discharge status: Home / Self Care | Payer: Federal, State, Local not specified - PPO | Attending: Emergency Medicine | Admitting: Emergency Medicine

## 2016-02-13 ENCOUNTER — Encounter (HOSPITAL_COMMUNITY)
Admission: RE | Admit: 2016-02-13 | Discharge: 2016-02-13 | Disposition: A | Discharge status: Home / Self Care | Payer: Federal, State, Local not specified - PPO | Source: Ambulatory Visit | Attending: General Surgery | Admitting: General Surgery

## 2016-02-13 DIAGNOSIS — N62 Hypertrophy of breast: Secondary | ICD-10-CM | POA: Diagnosis not present

## 2016-02-13 DIAGNOSIS — Z452 Encounter for adjustment and management of vascular access device: Secondary | ICD-10-CM | POA: Diagnosis not present

## 2016-02-13 DIAGNOSIS — IMO0002 Reserved for concepts with insufficient information to code with codable children: Secondary | ICD-10-CM

## 2016-02-13 DIAGNOSIS — Z803 Family history of malignant neoplasm of breast: Secondary | ICD-10-CM | POA: Diagnosis not present

## 2016-02-13 DIAGNOSIS — C50412 Malignant neoplasm of upper-outer quadrant of left female breast: Secondary | ICD-10-CM | POA: Diagnosis present

## 2016-02-13 DIAGNOSIS — C50912 Malignant neoplasm of unspecified site of left female breast: Secondary | ICD-10-CM | POA: Diagnosis present

## 2016-02-13 DIAGNOSIS — Z853 Personal history of malignant neoplasm of breast: Secondary | ICD-10-CM | POA: Diagnosis not present

## 2016-02-13 DIAGNOSIS — N6459 Other signs and symptoms in breast: Secondary | ICD-10-CM | POA: Diagnosis not present

## 2016-02-13 DIAGNOSIS — M1990 Unspecified osteoarthritis, unspecified site: Secondary | ICD-10-CM | POA: Diagnosis not present

## 2016-02-13 DIAGNOSIS — Z6841 Body Mass Index (BMI) 40.0 and over, adult: Secondary | ICD-10-CM | POA: Insufficient documentation

## 2016-02-13 DIAGNOSIS — J45909 Unspecified asthma, uncomplicated: Secondary | ICD-10-CM | POA: Insufficient documentation

## 2016-02-13 DIAGNOSIS — L7682 Other postprocedural complications of skin and subcutaneous tissue: Secondary | ICD-10-CM | POA: Insufficient documentation

## 2016-02-13 DIAGNOSIS — Z9221 Personal history of antineoplastic chemotherapy: Secondary | ICD-10-CM | POA: Diagnosis not present

## 2016-02-13 DIAGNOSIS — Z171 Estrogen receptor negative status [ER-]: Secondary | ICD-10-CM | POA: Diagnosis not present

## 2016-02-13 HISTORY — PX: BREAST LUMPECTOMY WITH RADIOACTIVE SEED AND SENTINEL LYMPH NODE BIOPSY: SHX6550

## 2016-02-13 HISTORY — PX: PERIPHERAL VASCULAR CATHETERIZATION: SHX172C

## 2016-02-13 SURGERY — BREAST LUMPECTOMY WITH RADIOACTIVE SEED AND SENTINEL LYMPH NODE BIOPSY
Anesthesia: General | Site: Chest | Laterality: Right

## 2016-02-13 MED ORDER — METHYLENE BLUE 0.5 % INJ SOLN
INTRAVENOUS | Status: AC
  Filled 2016-02-13: qty 10

## 2016-02-13 MED ORDER — ONDANSETRON HCL 4 MG/2ML IJ SOLN
INTRAMUSCULAR | Status: AC
  Filled 2016-02-13: qty 2

## 2016-02-13 MED ORDER — LIDOCAINE 2% (20 MG/ML) 5 ML SYRINGE
INTRAMUSCULAR | Status: AC
  Filled 2016-02-13: qty 5

## 2016-02-13 MED ORDER — TECHNETIUM TC 99M SULFUR COLLOID FILTERED
1.0000 | Freq: Once | INTRAVENOUS | Status: AC | PRN
  Administered 2016-02-13: 1 via INTRADERMAL

## 2016-02-13 MED ORDER — DEXAMETHASONE SODIUM PHOSPHATE 4 MG/ML IJ SOLN
INTRAMUSCULAR | Status: DC | PRN
  Administered 2016-02-13: 10 mg via INTRAVENOUS

## 2016-02-13 MED ORDER — ONDANSETRON HCL 4 MG/2ML IJ SOLN
INTRAMUSCULAR | Status: DC | PRN
  Administered 2016-02-13: 4 mg via INTRAVENOUS

## 2016-02-13 MED ORDER — DIPHENHYDRAMINE HCL 50 MG/ML IJ SOLN
12.5000 mg | Freq: Once | INTRAMUSCULAR | Status: AC
  Administered 2016-02-13: 12.5 mg via INTRAVENOUS

## 2016-02-13 MED ORDER — MIDAZOLAM HCL 2 MG/2ML IJ SOLN
INTRAMUSCULAR | Status: AC
  Filled 2016-02-13: qty 2

## 2016-02-13 MED ORDER — DEXAMETHASONE SODIUM PHOSPHATE 10 MG/ML IJ SOLN
INTRAMUSCULAR | Status: AC
  Filled 2016-02-13: qty 1

## 2016-02-13 MED ORDER — DIPHENHYDRAMINE HCL 50 MG/ML IJ SOLN
INTRAMUSCULAR | Status: AC
  Filled 2016-02-13: qty 1

## 2016-02-13 MED ORDER — LACTATED RINGERS IV SOLN: INTRAVENOUS | Status: DC

## 2016-02-13 MED ORDER — BUPIVACAINE-EPINEPHRINE (PF) 0.5% -1:200000 IJ SOLN
INTRAMUSCULAR | Status: AC
  Filled 2016-02-13: qty 30

## 2016-02-13 MED ORDER — MIDAZOLAM HCL 2 MG/2ML IJ SOLN
1.0000 mg | INTRAMUSCULAR | Status: DC | PRN
  Administered 2016-02-13 (×2): 2 mg via INTRAVENOUS

## 2016-02-13 MED ORDER — PROPOFOL 10 MG/ML IV BOLUS
INTRAVENOUS | Status: AC
  Filled 2016-02-13: qty 40

## 2016-02-13 MED ORDER — FENTANYL CITRATE (PF) 100 MCG/2ML IJ SOLN
25.0000 ug | INTRAMUSCULAR | Status: DC | PRN
  Administered 2016-02-13: 50 ug via INTRAVENOUS
  Administered 2016-02-13 (×2): 25 ug via INTRAVENOUS

## 2016-02-13 MED ORDER — FENTANYL CITRATE (PF) 100 MCG/2ML IJ SOLN
INTRAMUSCULAR | Status: AC
  Filled 2016-02-13: qty 2

## 2016-02-13 MED ORDER — BUPIVACAINE-EPINEPHRINE (PF) 0.5% -1:200000 IJ SOLN
INTRAMUSCULAR | Status: DC | PRN
  Administered 2016-02-13: 18 mL via PERINEURAL

## 2016-02-13 MED ORDER — GLYCOPYRROLATE 0.2 MG/ML IJ SOLN: 0.2000 mg | Freq: Once | INTRAMUSCULAR | Status: DC | PRN

## 2016-02-13 MED ORDER — PROPOFOL 10 MG/ML IV BOLUS
INTRAVENOUS | Status: DC | PRN
  Administered 2016-02-13 (×2): 50 mg via INTRAVENOUS
  Administered 2016-02-13: 150 mg via INTRAVENOUS

## 2016-02-13 MED ORDER — FENTANYL CITRATE (PF) 100 MCG/2ML IJ SOLN
50.0000 ug | INTRAMUSCULAR | Status: AC | PRN
  Administered 2016-02-13: 50 ug via INTRAVENOUS
  Administered 2016-02-13: 25 ug via INTRAVENOUS
  Administered 2016-02-13: 100 ug via INTRAVENOUS
  Administered 2016-02-13 (×2): 25 ug via INTRAVENOUS
  Administered 2016-02-13: 50 ug via INTRAVENOUS
  Administered 2016-02-13: 25 ug via INTRAVENOUS
  Administered 2016-02-13: 50 ug via INTRAVENOUS

## 2016-02-13 MED ORDER — SODIUM CHLORIDE 0.9 % IJ SOLN
INTRAMUSCULAR | Status: AC
  Filled 2016-02-13: qty 10

## 2016-02-13 MED ORDER — CEFAZOLIN SODIUM-DEXTROSE 2-4 GM/100ML-% IV SOLN
INTRAVENOUS | Status: AC
  Filled 2016-02-13: qty 100

## 2016-02-13 MED ORDER — HYDROCODONE-ACETAMINOPHEN 10-325 MG PO TABS: 1.0000 | ORAL_TABLET | Freq: Four times a day (QID) | ORAL | Status: DC | PRN

## 2016-02-13 MED ORDER — CHLORHEXIDINE GLUCONATE 4 % EX LIQD: 1.0000 "application " | Freq: Once | CUTANEOUS | Status: DC

## 2016-02-13 MED ORDER — BUPIVACAINE-EPINEPHRINE (PF) 0.5% -1:200000 IJ SOLN
INTRAMUSCULAR | Status: DC | PRN
  Administered 2016-02-13: 30 mL

## 2016-02-13 MED ORDER — LIDOCAINE HCL (CARDIAC) 20 MG/ML IV SOLN
INTRAVENOUS | Status: DC | PRN
  Administered 2016-02-13: 100 mg via INTRAVENOUS

## 2016-02-13 MED ORDER — CEFAZOLIN SODIUM 1-5 GM-% IV SOLN
INTRAVENOUS | Status: AC
  Filled 2016-02-13: qty 50

## 2016-02-13 MED ORDER — SODIUM CHLORIDE 0.9 % IJ SOLN
INTRAVENOUS | Status: DC | PRN
  Administered 2016-02-13: 5 mL

## 2016-02-13 MED ORDER — LACTATED RINGERS IV SOLN
INTRAVENOUS | Status: DC
  Administered 2016-02-13: 12:00:00 via INTRAVENOUS
  Administered 2016-02-13: 10 mL/h via INTRAVENOUS
  Administered 2016-02-13: 10:00:00 via INTRAVENOUS

## 2016-02-13 MED ORDER — SCOPOLAMINE 1 MG/3DAYS TD PT72: 1.0000 | MEDICATED_PATCH | Freq: Once | TRANSDERMAL | Status: DC | PRN

## 2016-02-13 MED ORDER — DEXTROSE 5 % IV SOLN
3.0000 g | INTRAVENOUS | Status: AC
  Administered 2016-02-13: 2 g via INTRAVENOUS

## 2016-02-13 SURGICAL SUPPLY — 48 items
APPLIER CLIP 9.375 MED OPEN (MISCELLANEOUS) ×4
BINDER BREAST 3XL (BIND) ×4 IMPLANT
BINDER BREAST LRG (GAUZE/BANDAGES/DRESSINGS) IMPLANT
BINDER BREAST MEDIUM (GAUZE/BANDAGES/DRESSINGS) IMPLANT
BINDER BREAST XLRG (GAUZE/BANDAGES/DRESSINGS) IMPLANT
BINDER BREAST XXLRG (GAUZE/BANDAGES/DRESSINGS) IMPLANT
BLADE SURG 15 STRL LF DISP TIS (BLADE) ×2 IMPLANT
BLADE SURG 15 STRL SS (BLADE) ×2
CANISTER SUC SOCK COL 7IN (MISCELLANEOUS) IMPLANT
CANISTER SUCT 1200ML W/VALVE (MISCELLANEOUS) IMPLANT
CHLORAPREP W/TINT 26ML (MISCELLANEOUS) ×4 IMPLANT
CLIP APPLIE 9.375 MED OPEN (MISCELLANEOUS) ×2 IMPLANT
COVER BACK TABLE 60X90IN (DRAPES) ×4 IMPLANT
COVER MAYO STAND STRL (DRAPES) ×4 IMPLANT
COVER PROBE W GEL 5X96 (DRAPES) ×4 IMPLANT
DECANTER SPIKE VIAL GLASS SM (MISCELLANEOUS) IMPLANT
DEVICE DUBIN W/COMP PLATE 8390 (MISCELLANEOUS) ×4 IMPLANT
DRAPE LAPAROSCOPIC ABDOMINAL (DRAPES) ×4 IMPLANT
DRAPE UTILITY XL STRL (DRAPES) ×4 IMPLANT
ELECT COATED BLADE 2.86 ST (ELECTRODE) ×4 IMPLANT
ELECT REM PT RETURN 9FT ADLT (ELECTROSURGICAL) ×4
ELECTRODE REM PT RTRN 9FT ADLT (ELECTROSURGICAL) ×2 IMPLANT
GLOVE BIOGEL PI IND STRL 8 (GLOVE) ×2 IMPLANT
GLOVE BIOGEL PI INDICATOR 8 (GLOVE) ×2
GLOVE ECLIPSE 7.5 STRL STRAW (GLOVE) ×4 IMPLANT
GOWN STRL REUS W/ TWL LRG LVL3 (GOWN DISPOSABLE) ×2 IMPLANT
GOWN STRL REUS W/ TWL XL LVL3 (GOWN DISPOSABLE) ×2 IMPLANT
GOWN STRL REUS W/TWL LRG LVL3 (GOWN DISPOSABLE) ×2
GOWN STRL REUS W/TWL XL LVL3 (GOWN DISPOSABLE) ×2
ILLUMINATOR WAVEGUIDE N/F (MISCELLANEOUS) ×4 IMPLANT
KIT MARKER MARGIN INK (KITS) ×4 IMPLANT
LIQUID BAND (GAUZE/BANDAGES/DRESSINGS) ×8 IMPLANT
NDL SAFETY ECLIPSE 18X1.5 (NEEDLE) ×2 IMPLANT
NEEDLE HYPO 18GX1.5 SHARP (NEEDLE) ×2
NEEDLE HYPO 25X1 1.5 SAFETY (NEEDLE) ×8 IMPLANT
NS IRRIG 1000ML POUR BTL (IV SOLUTION) IMPLANT
PACK BASIN DAY SURGERY FS (CUSTOM PROCEDURE TRAY) ×4 IMPLANT
PENCIL BUTTON HOLSTER BLD 10FT (ELECTRODE) ×4 IMPLANT
SLEEVE SCD COMPRESS KNEE MED (MISCELLANEOUS) ×4 IMPLANT
SPONGE LAP 4X18 X RAY DECT (DISPOSABLE) ×8 IMPLANT
SUT MON AB 5-0 PS2 18 (SUTURE) ×4 IMPLANT
SUT VICRYL 3-0 CR8 SH (SUTURE) ×4 IMPLANT
SYR CONTROL 10ML LL (SYRINGE) ×8 IMPLANT
TOWEL OR 17X24 6PK STRL BLUE (TOWEL DISPOSABLE) ×4 IMPLANT
TOWEL OR NON WOVEN STRL DISP B (DISPOSABLE) ×4 IMPLANT
TUBE CONNECTING 20'X1/4 (TUBING) ×1
TUBE CONNECTING 20X1/4 (TUBING) ×3 IMPLANT
YANKAUER SUCT BULB TIP NO VENT (SUCTIONS) ×4 IMPLANT

## 2016-02-13 NOTE — Anesthesia Postprocedure Evaluation (Signed)
Anesthesia Post Note  Patient: Cindy Bradshaw  Procedure(s) Performed: Procedure(s) (LRB): BREAST LUMPECTOMY WITH RADIOACTIVE SEED AND SENTINEL LYMPH NODE BIOPSY (Left) PORTA CATH REMOVAL (Right)  Patient location during evaluation: PACU Anesthesia Type: General Level of consciousness: awake and alert Pain management: pain level controlled Vital Signs Assessment: post-procedure vital signs reviewed and stable Respiratory status: spontaneous breathing, nonlabored ventilation, respiratory function stable and patient connected to face mask oxygen Cardiovascular status: blood pressure returned to baseline and stable Postop Assessment: no signs of nausea or vomiting Anesthetic complications: no    Last Vitals:  Filed Vitals:   02/13/16 1400 02/13/16 1415  BP: 130/82 137/85  Pulse: 99 95  Temp:    Resp: 21 19    Last Pain:  Filed Vitals:   02/13/16 1427  PainSc: 0-No pain                 [name] A

## 2016-02-13 NOTE — Interval H&P Note (Signed)
History and Physical Interval Note:  02/13/2016 11:25 AM  Cindy Bradshaw  has presented today for surgery, with the diagnosis of LEFT BREAST CANCER  The various methods of treatment have been discussed with the patient and family. After consideration of risks, benefits and other options for treatment, the patient has consented to  Procedure(s): BREAST LUMPECTOMY WITH RADIOACTIVE SEED AND SENTINEL LYMPH NODE BIOPSY (Left) as a surgical intervention .  The patient's history has been reviewed, patient examined, no change in status, stable for surgery.  I have reviewed the patient's chart and labs.  Questions were answered to the patient's satisfaction.     Alika Saladin T

## 2016-02-13 NOTE — Anesthesia Procedure Notes (Addendum)
Anesthesia Regional Block:  Pectoralis block  Pre-Anesthetic Checklist: ,, timeout performed, Correct Patient, Correct Site, Correct Laterality, Correct Procedure, Correct Position, site marked, Risks and benefits discussed,  Surgical consent,  Pre-op evaluation,  At surgeon's request and post-op pain management  Laterality: Left  Prep: chloraprep       Needles:  Injection technique: Single-shot  Needle Type: Echogenic Needle     Needle Length: 9cm 9 cm Needle Gauge: 21 and 21 G    Additional Needles:  Procedures: ultrasound guided (picture in chart) Pectoralis block Narrative:  Start time: 02/13/2016 10:50 AM End time: 02/13/2016 11:00 AM Injection made incrementally with aspirations every 5 mL.  Performed by: Personally  Anesthesiologist: Rod Mae   Procedure Name: LMA Insertion Date/Time: 02/13/2016 11:43 AM Performed by: Maryella Shivers Pre-anesthesia Checklist: Patient identified, Emergency Drugs available, Suction available and Patient being monitored Patient Re-evaluated:Patient Re-evaluated prior to inductionOxygen Delivery Method: Circle system utilized Preoxygenation: Pre-oxygenation with 100% oxygen Intubation Type: IV induction Ventilation: Mask ventilation without difficulty LMA: LMA inserted LMA Size: 4.0 Number of attempts: 1 Airway Equipment and Method: Bite block Placement Confirmation: positive ETCO2 Tube secured with: Tape Dental Injury: Teeth and Oropharynx as per pre-operative assessment

## 2016-02-13 NOTE — Progress Notes (Signed)
Assisted [place] with left, ultrasound guided, pectoralis block. Side rails up, monitors on throughout procedure. See vital signs in flow sheet. Tolerated Procedure well.

## 2016-02-13 NOTE — ED Provider Notes (Signed)
CSN: [id number]     Arrival date & time 02/13/16  1845 History   First MD Initiated Contact with Patient 02/13/16 1903     Chief Complaint  Patient presents with  . Surgical problem    HPI   45 year old female presents today with complaints of postsurgical problem. Patient reports that she underwent lumpectomy earlier today performed by Dr. Excell Seltzer, this was a large lump attacked me sentinel node biopsy. Patient reports she started to have bloody drainage from the left breast this evening. On-call physician Dr. Hassell Done was contacted prior to my evaluation, and entered the room as my initial evaluation began. He reports that he evaluated the patient, this was likely a seroma, and that she has all her required medications at home. Patient will be discharged home at his request with follow-up with [facility] tomorrow.  Past Medical History  Diagnosis Date  . Cancer ([place])   . Migraine   . Asthma   . Breast cancer ([place])   . Arthritis   . Back disorder     degenerative disk disease  . Pap smear for cervical cancer screening 08/12/15  . Mammogram abnormal 08/24/15    first  . GERD (gastroesophageal reflux disease)    Past Surgical History  Procedure Laterality Date  . Cholecystectomy    . Cesarean section    . Portacath placement [name] 09/12/2015    Procedure: INSERTION PORT-A-CATH;  Surgeon: Excell Seltzer, MD;  Location: [facility];  Service: General;  Laterality: [name];  . Cardiac catheterization [name] 01/24/2016    Procedure: Left Heart Cath and Coronary Angiography;  Surgeon: Leonie Man, MD;  Location: [facility];  Service: Cardiovascular;  Laterality: [name];   Family History  Problem Relation Age of Onset  . Lung cancer Mother 12    2 different types of lung cancer; metastasis to brain; smoker  . Other Mother     hx of hysterectomy for unspecified reason  . Bone cancer Maternal Uncle     dx. early 31s  . Breast cancer Maternal Grandmother     dx. early 29s, s/p mastectomy  .  Cancer Paternal Grandfather     unspecified type of cancer, dx. late 46s  . Congestive Heart Failure Father     smoker  . Stroke Father   . Cirrhosis Maternal Grandfather   . Heart Problems Maternal Grandfather   . Lung cancer Other     (maternal great uncle; MGM's brother); had a coal stove  . Cancer Cousin     unspecified type; d. early age (paternal first cousin once-removed)  . Cancer Cousin     dx. as a kid; in remission today; (paternal 2nd cousin)  . Cancer Cousin     unspecified type; d. early 35s; (maternal 1st cousin)   Social History  Substance Use Topics  . Smoking status: Never Smoker   . Smokeless tobacco: Never Used  . Alcohol Use: 0.0 oz/week    0 Standard drinks or equivalent per week     Comment: occassional glass of wine   OB History    Gravida Para Term Preterm AB TAB SAB Ectopic Multiple Living   0 0 0 0 0 0 0 0       Review of Systems  All other systems reviewed and are negative.   Allergies  Tape; Dilaudid; Ivp dye; Percocet; and Toradol  Home Medications   Prior to Admission medications   Medication Sig Start Date End Date Taking? Authorizing Provider  acetaminophen (TYLENOL)  500 MG tablet Take 1,000 mg by mouth every 6 (six) hours as needed for mild pain, moderate pain or headache.   Yes Historical Provider, MD  HYDROcodone-acetaminophen (NORCO) 10-325 MG tablet Take 1-2 tablets by mouth every 6 (six) hours as needed for moderate pain or severe pain. 02/13/16  Yes Excell Seltzer, MD  triamcinolone cream (KENALOG) 0.1 % Apply 1 application topically 2 (two) times daily. 12/13/15  Yes Laurie Panda, NP   BP 209/97 mmHg  Pulse 62  Temp(Src) 97.7 F (36.5 C) (Oral)  Resp 22  Ht 5\' 4"  (1.626 m)  Wt 112.038 kg  BMI 42.38 kg/m2  SpO2 99%  LMP 10/09/2015 Physical Exam  Constitutional: She is oriented to person, place, and time. She appears well-developed and well-nourished.  HENT:  Head: Normocephalic and atraumatic.  Eyes: Conjunctivae  are normal. Pupils are equal, round, and reactive to light. Right eye exhibits no discharge. Left eye exhibits no discharge. No scleral icterus.  Neck: Normal range of motion. No JVD present. No tracheal deviation present.  Pulmonary/Chest: Effort normal. No stridor.  Blood noted on patient's clothes  Neurological: She is alert and oriented to person, place, and time. Coordination normal.  Psychiatric: She has a normal mood and affect. Her behavior is normal. Judgment and thought content normal.  Nursing note and vitals reviewed.   ED Course  Procedures (including critical care time) Labs Review Labs Reviewed - No data to display  Imaging Review Nm Sentinel Node Inj-no Rpt (breast)  02/13/2016  CLINICAL DATA: ancer left breast Sulfur colloid was injected intradermally by the nuclear medicine technologist for breast cancer sentinel node localization.   Mm Breast Surgical Specimen  02/13/2016  CLINICAL DATA:  Status post left lumpectomy EXAM: SPECIMEN RADIOGRAPH OF THE LEFT BREAST COMPARISON:  Previous exam(s). FINDINGS: Status post excision of the left breast. The radioactive seed and biopsy marker clip are present, completely intact, and were marked for pathology. Findings were communicated to the operating room via telephone at the time of interpretation. IMPRESSION: Specimen radiograph of the left breast. Electronically Signed   By: Pamelia Hoit M.D.   On: 02/13/2016 12:44   I have personally reviewed and evaluated these images and lab results as part of my medical decision-making.   EKG Interpretation None      MDM   Final diagnoses:  Seroma    Labs:  Imaging:  Consults:  Therapeutics:  Discharge Meds:   Assessment/Plan: 45 year old female arrives at the ED for specialist consult. Brief history was obtained prior to specialty consult. Dr. Hassell Done personally evaluated the patient and instructed to have her discharged with close follow-up with Dr. Excell Seltzer tomorrow in the  office. Patient has all her medications needed, she will be discharged home with strict return precautions.        Okey Regal, PA-C 02/13/16 2041  Leonard Schwartz, MD 02/15/16 [phone]

## 2016-02-13 NOTE — Transfer of Care (Signed)
Immediate Anesthesia Transfer of Care Note  Patient: Cindy Bradshaw  Procedure(s) Performed: Procedure(s): BREAST LUMPECTOMY WITH RADIOACTIVE SEED AND SENTINEL LYMPH NODE BIOPSY (Left) PORTA CATH REMOVAL (Right)  Patient Location: PACU  Anesthesia Type:GA combined with regional for post-op pain  Level of Consciousness: awake, sedated and confused  Airway & Oxygen Therapy: Patient Spontanous Breathing and Patient connected to face mask oxygen  Post-op Assessment: Report given to RN and Post -op Vital signs reviewed and stable  Post vital signs: Reviewed and stable  Last Vitals:  Filed Vitals:   02/13/16 1115 02/13/16 1130  BP: 118/88 131/65  Pulse: 68 89  Temp:    Resp:  18    Last Pain: There were no vitals filed for this visit.       Complications: No apparent anesthesia complications

## 2016-02-13 NOTE — Op Note (Signed)
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 She has  Undergone neoadjuvant chemotherapy with significant reduction in the size of her tumor but still at least a 2 cm mass. Lymph nodes have been clinically negative throughout. She has macromastia and  After consultation with Dr. Iran Planas we have elected to proceed with left breast lumpectomy and axillary sentinel lymph node biopsy as a staged procedure with planned bilateral breast reduction once margins had been established as clear. Port-A-Cath removal also planned. Patient was marked with her mammoplasty incisions  Earlier today.  She has previously undergone placement of a radioactive seed at the tumor and marking clip site. In the holding area she underwent injection of 1 mCi of technetium sulfur colloid intradermally around the left nipple.    Procedure Detail:  Patient was taken to the operating room, placed in the supine position on the operating table, and laryngeal mask general anesthesia induced. She received preoperative IV antibiotics. PAS were in place. The entire anterior chest and left axilla and upper arm were widely sterilely prepped and draped. Prior to this under sterile technique I had injected 5 mL of dilute methylene blue subcutaneously beneath the left nipple and massaged this for several minutes. Patient timeout was performed and correct procedure verified. The tumor was actually palpablein the upper outer quadrant and its location was confirmed as well with the neoprobe. I used the lateral portion of  her marked mammoplasty incision sharply incising with a knife and then a skin subcutaneous flap was raised superiorly and laterally overlying the tumor. The tumor felt fairly superficial as it was approached and directly over the tumor  The flap taken was only skin. The area was unable to be widely exposed.Using cautery a very generous lumpectomy was performed in light of plans mammoplasty taking wide margins in all directions and taking the excision down toward the chest wall posteriorly. The specimen was removed. The seed and the palpable area appeared well contained grossly. The specimen was inked for margins. Specimen x-ray showed the seton marking clip and the tumor centrally located within the specimen. The wound was irrigated and complete hemostasis assured. I was far enough superior and lateral that I elected to use the same incision for the axillary sentinel lymph node biopsy. Using the neoprobe for guidance a hot area was localized in the left axilla. The clavipectoral fascia was incised just lateral to the pectoralis major and minor and the axilla proper entered. Initially somewhat inferior and axilla I came down upon a hot node with blue dye which was completely excised with cautery and ex vivo had counts of about 600. Following this another more posterior node with high counts was identified and dissected bluntly and with cautery. It was excised and  Had counts ex vivo of about 250 and did not contain blue dye. There was still higher counts more superiorly in the axilla and the clavipectoral fascia was incised along the more superiorly and using careful blunt dissection I dissected down onto a node with blue dye and significant elevated counts which was completely excised with cautery and ex vivo had counts of about 1200. At  this point background in the axilla was negligible. None of these nodes was grossly abnormal and there was no palpable adenopathy within the axilla. Hemostasis was assured  And one  small bleeder clipped. The deep axillary tissue was closed with interrupted 30 Vicryls. The breast wound was irrigated and hemostasis assured.  The lumpectomy cavity was marked with clips. The subcutaneous tissue was closed with interrupted 3-0 Vicryl and the skin closed with subcuticular  5-0 Monocryl and Liquiban. Attention was turned to the Port-A-Cath removal. The previous slightly wide scar was elliptically excised. Dissection was carried down through the subcutaneous using cautery down onto the port which was freed and the 2 stay sutures removed. The hub and catheter were dissected and the catheter removed easily intact and the entire device was removed  Intact. The subcutaneous was closed with interrupted 3-0 Vicryl and the skin with subcuticular 5-0 Monocryl and Liquiban. Sponge needle and estimate counts were correct. Binder was placed.    Findings: As above  Estimated Blood Loss:  Minimal         Drains: none  Blood Given: none          Specimens: #1left breast lumpectomy oriented with ink    #2 left axillary sentinel lymph nodes 3, 2 hot and blue and one hot only        Complications:  * No complications entered in OR log *         Disposition: PACU - hemodynamically stable.         Condition: stable

## 2016-02-13 NOTE — H&P (Signed)
History of Present Illness Cindy Bradshaw [name] MD; 02/03/2016 3:23 PM) The patient is a 45 year old female who presents with breast cancer. She returns to the office for surgical planning following neoadjuvant chemotherapy for triple negative cancer of the left breast.  Her original presentation was: The patient first noted a tender lump in her upper outer left breast about 6 weeks ago. She had an appointment with her primary physician and just kept this. From there she was referred through her GYN physician to the breast center for evaluation. Subsequent imaging included diagnostic mamogram showing a poorly defined oval mass in the posterior upper outer left breast and ultrasound showing a 3.1 x 2.4 cm irregular hypoechoic mass at the 1:30 o'clock position of the left breast 20 cm from the nipple. Ultrasound of the axilla was negative.. An ultrasound guided breast biopsy was performed on August 17, 2015 with pathology revealing invasive ductal carcinoma of the breast. She is seen now in the office for initial treatment planning. She has experienced some pain around the lump and some intermittent localized swelling in that area of her breast. She does not have a personal history of any previous breast problems. Family history significant only for maternal grandmother with breast cancer  Findings at that time were the following: Tumor size: 3.1 cm Tumor grade: 3, Ki-67 90% Estrogen Receptor: Negative Progesterone Receptor: Negative Her-2 neu: Negative Lymph node status: Negative  Recent MRI was performed: Left breast: There is an irregular, heterogeneously enhancing mass in the upper-outer quadrant of the left breast, posterior third, corresponding to the biopsy proven malignancy. The mass has decreased in size since the breast MRI of 09/14/2015. Currently, the mass measures 2.9 cm x 1.8 x 1.7 cm (AP by transverse by craniocaudal). Previously, on the breast MRI of 09/14/2015,  this mass measured 5.1 x 4.3 x 4.4 cm. A biopsy clip is associated with the the known malignancy.  In the anterior third of the upper-outer quadrant of the left breast is a second biopsy clip, at the site of the benign MRI-guided biopsy ([place]). There is no mass or suspicious enhancement in the region of this clip. There are no new areas of concern in the left breast.  Lymph nodes: No abnormal appearing lymph nodes.  Ancillary findings: None.  IMPRESSION: 1. Positive response to neoadjuvant chemotherapy in this patient with a known upper-outer quadrant left breast invasive ductal carcinoma. The malignant mass now measures 2.9 x 1.8 x 1.7 cm. 2. There are no additional areas of concern in the left breast on MRI. No suspicious enhancement in the region of the previous MRI-guided biopsy in the anterior upper outer quadrant of the left breast. 3. No MRI evidence of malignancy in the right breast. No suspicious enhancement in the region of the previous MRI guided biopsy in the anterior upper inner quadrant.  RECOMMENDATION: Continue treatment planning for known left breast invasive ductal carcinoma.  She had quite a lot of nausea and vomiting and dehydration with her chemotherapy treatments and stopped a couple of treatments early. Last treatment was 4 weeks ago. She has developed quite a lot of pain at the tumor site which she is concerned about and came in for another preoperative check. She has seen Dr. Iran Planas and bilateral breast reduction surgery as planned in conjunction with left breast lumpectomy in a staged procedure.   Allergies [name], CMA; 02/03/2016 3:05 PM) Dilaudid *ANALGESICS - OPIOID* Itching. Contrast Media Ready-Box *MEDICAL DEVICES AND SUPPLIES* Itching. Percocet *ANALGESICS - OPIOID* Toradol *  ANALGESICS - ANTI-INFLAMMATORY*  Medication History [name], CMA; 02/03/2016 3:06 PM) PredniSONE (5MG Tablet, 1 (one) Tablet Oral daily, Taken starting  09/09/2015) Active. (Protocol for MRI ; one tablet 13 hours prior to exam.; one tablet seven hours prior to exam.; one tablet one hour prior to exam.) ProAir HFA (108 (90 Base)MCG/ACT Aerosol Soln, Inhalation) Active. SUMAtriptan Succinate (100MG Tablet, Oral) Active. Zofran (8MG Tablet, Oral as needed) Active. Hydrocodone/Acetaminophen (Oral) Active. Ativan (0.5MG Tablet, Oral) Active. Triamcinolone Acetonide (0.1% Cream, External) Active. Lidocaine-Prilocaine (2.5-2.5% Cream, External) Active. Medications Reconciled  Vitals [name] CMA; 02/03/2016 3:06 PM) 02/03/2016 3:06 PM Weight: 247 lb Height: 64in Body Surface Area: 2.14 m Body Mass Index: 42.4 kg/m  Temp.: 98.68F  Pulse: 67 (Regular)  BP: 130/68 (Sitting, Left Arm, Standard)       Physical Exam Cindy Bradshaw T. [name] MD; 02/03/2016 3:24 PM) The physical exam findings are as follows: Note:General: Alert, obese African-American female, in no distress Skin: Warm and dry without rash or infection. HEENT: No palpable masses or thyromegaly. Sclera nonicteric. Lymph nodes: No cervical, supraclavicular, palpable. Breasts: Bilateral macromastia. Approximately 2 cm stable feeling mass in the upper outer left breast unchanged from recent exam but quite tender. No inflammatory changes. Lungs: Breath sounds clear and equal. No wheezing or increased work of breathing. Cardiovascular: Regular rate and rhythm without murmer. Extremities: No edema or joint swelling or deformity. No chronic venous stasis changes. Neurologic: Alert and fully oriented. Gait normal. No focal weakness. Psychiatric: Normal mood and affect. Thought content appropriate with normal judgement and insight    Assessment & Plan Cindy Bradshaw T. Nakiyah Beverley MD; 02/03/2016 3:26 PM) BREAST CANCER OF UPPER-OUTER QUADRANT OF LEFT FEMALE BREAST (C50.412) Impression: 45 year old female with a diagnosis of cancer of the left breast, upper outer quadrant.  Clinical stage 1 c, T2 N 0 M0, triple negative. She has completed a majority of her chemotherapy with good clinical response of her tumor from 5.1 cm by MRI preoperatively to current 2.9 cm. No evidence of lymph node involvement clinically. I went over all this information with her. I recommended proceeding with left breast lumpectomy with radioactive seed localization(as her tumor may continue to shrink in the coming weeks) and axillary sentinel lymph node biopsy. She has significant macromastia and some back discomfort from this and I believe would be an excellent candidate for bilateral breast reduction at the time of her lumpectomy which would of course enable [place] to obtain wider and more reliable margins around her cancer. She has seen Dr. Iran Planas and bilateral breast reduction is planned in a staged fashion. All her questions were answered. Plan Port-A-Cath removal at the same time.

## 2016-02-13 NOTE — Anesthesia Preprocedure Evaluation (Addendum)
Anesthesia Evaluation  Patient identified by MRN, date of birth, ID band Patient awake    Reviewed: Allergy & Precautions, H&P , NPO status , Patient's Chart, lab work & pertinent test results  Airway Mallampati: II  TM Distance: >3 FB Neck ROM: full    Dental no notable dental hx. (+) Teeth Intact, Dental Advisory Given   Pulmonary neg pulmonary ROS, shortness of breath and with exertion, asthma ,    Pulmonary exam normal breath sounds clear to auscultation       Cardiovascular Exercise Tolerance: Good negative cardio ROS Normal cardiovascular exam Rhythm:regular Rate:Normal  Abnormal myoview scan but clean coronaries on cath   Neuro/Psych negative neurological ROS  negative psych ROS   GI/Hepatic negative GI ROS, Neg liver ROS,   Endo/Other  negative endocrine ROSMorbid obesity  Renal/GU negative Renal ROS  negative genitourinary   Musculoskeletal   Abdominal   Peds  Hematology negative hematology ROS (+)   Anesthesia Other Findings   Reproductive/Obstetrics negative OB ROS                            Anesthesia Physical Anesthesia Plan  ASA: III  Anesthesia Plan: General   Post-op Pain Management: GA combined w/ Regional for post-op pain   Induction: Intravenous  Airway Management Planned: LMA  Additional Equipment:   Intra-op Plan:   Post-operative Plan:   Informed Consent: I have reviewed the patients History and Physical, chart, labs and discussed the procedure including the risks, benefits and alternatives for the proposed anesthesia with the patient or authorized representative who has indicated his/her understanding and acceptance.   Dental Advisory Given  Plan Discussed with: CRNA  Anesthesia Plan Comments:         Anesthesia Quick Evaluation

## 2016-02-13 NOTE — ED Notes (Addendum)
Patient reports that she was seen today and had a lumpectomy.  States that when she got home that incision began bleeding.  Reports large amount of blood.

## 2016-02-13 NOTE — Progress Notes (Signed)
Patient called facility at 1730 and told RN that she was "bleeding". She stated that she bled through on her pillow. Patient had not assessed where she was bleeding. Family member assessed while RN was on phone and said that it was the end of the incision. Patient was instructed to put gauze on site where it is bleeding and put breast binder on. Patient was also instructed to call Dr. Lear Ng office to inform him of situation.

## 2016-02-13 NOTE — ED Notes (Signed)
Surgeon at the bedside.

## 2016-02-13 NOTE — Consult Note (Signed)
The patient had surgery on her right breast today by Dr. Excell Seltzer.  This was a large lumpectomy and sentinel node biopsy of the left breast.  She called tonight because of bloodly drainage.    Patient was seen by me in ER #4 and the lateral aspect of her incision has a small pin hole draining serosanguinous fluid.  The breast is not distended or tender.  This appears to be spontaneous drainage of a seroma cavity and doesn't represent active bleeding.    Gauze applied and breast binder reapplied.  Patient educated on the issue and the likelihood of continued bleeding.    She is instructed to call the office tomorrow and check in with Dr. Excell Seltzer.    Impression:  Draining seroma;  Follow up with Dr. Excell Seltzer in the office.

## 2016-02-13 NOTE — Discharge Instructions (Signed)
Please follow-up with your surgeon tomorrow for reevaluation. If you experience any new or worsening signs or symptoms please return immediately.

## 2016-02-13 NOTE — Discharge Instructions (Signed)
[facility] °Office Phone Number [phone] ° °BREAST BIOPSY/ PARTIAL MASTECTOMY: POST OP INSTRUCTIONS ° °Always review your discharge instruction sheet given to you by the facility where your surgery was performed. ° °IF YOU HAVE DISABILITY OR FAMILY LEAVE FORMS, YOU MUST BRING THEM TO THE OFFICE FOR PROCESSING.  DO NOT GIVE THEM TO YOUR DOCTOR. ° °1. A prescription for pain medication may be given to you upon discharge.  Take your pain medication as prescribed, if needed.  If narcotic pain medicine is not needed, then you may take acetaminophen (Tylenol) or ibuprofen (Advil) as needed. °2. Take your usually prescribed medications unless otherwise directed °3. If you need a refill on your pain medication, please contact your pharmacy.  They will contact our office to request authorization.  Prescriptions will not be filled after 5pm or on week-ends. °4. You should eat very light the first 24 hours after surgery, such as soup, crackers, pudding, etc.  Resume your normal diet the day after surgery. °5. Most patients will experience some swelling and bruising in the breast.  Ice packs and a good support bra will help.  Swelling and bruising can take several days to resolve.  °6. It is common to experience some constipation if taking pain medication after surgery.  Increasing fluid intake and taking a stool softener will usually help or prevent this problem from occurring.  A mild laxative (Milk of Magnesia or Miralax) should be taken according to package directions if there are no bowel movements after 48 hours. °7. Unless discharge instructions indicate otherwise, you may remove your bandages 24-48 hours after surgery, and you may shower at that time.  You may have steri-strips (small skin tapes) in place directly over the incision.  These strips should be left on the skin for 7-10 days.  If your surgeon used skin glue on the incision, you may shower in 24 hours.  The glue will flake off over the  next 2-3 weeks.  Any sutures or staples will be removed at the office during your follow-up visit. °8. ACTIVITIES:  You may resume regular daily activities (gradually increasing) beginning the next day.  Wearing a good support bra or sports bra minimizes pain and swelling.  You may have sexual intercourse when it is comfortable. °a. You may drive when you no longer are taking prescription pain medication, you can comfortably wear a seatbelt, and you can safely maneuver your car and apply brakes. °b. RETURN TO WORK:  ______________________________________________________________________________________ °9. You should see your doctor in the office for a follow-up appointment approximately two weeks after your surgery.  Your doctor’s nurse will typically make your follow-up appointment when she calls you with your pathology report.  Expect your pathology report 2-3 business days after your surgery.  You may call to check if you do not hear from us after three days. °10. OTHER INSTRUCTIONS: _______________________________________________________________________________________________ _____________________________________________________________________________________________________________________________________ °_____________________________________________________________________________________________________________________________________ °_____________________________________________________________________________________________________________________________________ ° °WHEN TO CALL YOUR DOCTOR: °1. Fever over 101.0 °2. Nausea and/or vomiting. °3. Extreme swelling or bruising. °4. Continued bleeding from incision. °5. Increased pain, redness, or drainage from the incision. ° °The clinic staff is available to answer your questions during regular business hours.  Please don’t hesitate to call and ask to speak to one of the nurses for clinical concerns.  If you have a medical emergency, go to the nearest  emergency room or call 911.  A surgeon from [facility] is always on call at the hospital. ° °For further questions, please visit [email or web address]  ° ° ° °  Post Anesthesia Home Care Instructions ° °Activity: °Get plenty of rest for the remainder of the day. A responsible adult should stay with you for 24 hours following the procedure.  °For the next 24 hours, DO NOT: °-Drive a car °-Operate machinery °-Drink alcoholic beverages °-Take any medication unless instructed by your physician °-Make any legal decisions or sign important papers. ° °Meals: °Start with liquid foods such as gelatin or soup. Progress to regular foods as tolerated. Avoid greasy, spicy, heavy foods. If nausea and/or vomiting occur, drink only clear liquids until the nausea and/or vomiting subsides. Call your physician if vomiting continues. ° °Special Instructions/Symptoms: °Your throat may feel dry or sore from the anesthesia or the breathing tube placed in your throat during surgery. If this causes discomfort, gargle with warm salt water. The discomfort should disappear within 24 hours. ° °If you had a scopolamine patch placed behind your ear for the management of post- operative nausea and/or vomiting: ° °1. The medication in the patch is effective for 72 hours, after which it should be removed.  Wrap patch in a tissue and discard in the trash. Wash hands thoroughly with soap and water. °2. You may remove the patch earlier than 72 hours if you experience unpleasant side effects which may include dry mouth, dizziness or visual disturbances. °3. Avoid touching the patch. Wash your hands with soap and water after contact with the patch. °  ° °

## 2016-02-14 ENCOUNTER — Encounter (HOSPITAL_BASED_OUTPATIENT_CLINIC_OR_DEPARTMENT_OTHER): Payer: Self-pay | Admitting: General Surgery

## 2016-02-16 ENCOUNTER — Encounter (HOSPITAL_BASED_OUTPATIENT_CLINIC_OR_DEPARTMENT_OTHER): Payer: Self-pay | Admitting: *Deleted

## 2016-02-16 ENCOUNTER — Other Ambulatory Visit: Payer: Self-pay | Admitting: *Deleted

## 2016-02-16 NOTE — H&P (Signed)
  Subjective:    Patient ID: Cindy Bradshaw is a 45 y.o. female.  HPI  Patient of Drs. Gudena and Hoxworth with left breast cancer. Plan staged oncoplastic reconstruction.   Presented in 08/2015 with palpable mass. MMG/US with 3.1 cm IDC triple negative UOQ, axilla negative. Completed neoadjuvant chemotherapy. Course marked by hospitalization for nausea, need for reduction dose and most recently complaint chest pain and had hospitalization last month for cardiac work up. Negative PE CT. Underwent cardiac catheterization which was negative. Final MRI with decrease in size mass current 2.9 cm x 1.8 x 1.7 cm from 5.1 x 4.3 x 4.4 cm. She had additional MRI biopsy of left breast lesion 09/2015 with pathology PASH. MRI notes in the anterior third of the upper-outer quadrant of the left breast is a second biopsy clip, at the site of this benign PASH.  Genetic testing negative  Current G cup. Reports chronic problems with yeast type infections, chronic upper back pain and shoulder grooving. Wt stable- interested in surgical wt loss.  Review of Systems     Objective:   Physical Exam  Constitutional: She is oriented to person, place, and time.  Cardiovascular: Normal rate, regular rhythm and normal heart sounds.  Pulmonary/Chest: She is in respiratory distress.  Abdominal: Soft.  Genitourinary: There is breast tenderness.  Lymphadenopathy:  She has no axillary adenopathy.  Neurological: She is alert and oriented to person, place, and time.   +shoulder grooving Grade 3 ptosis bilateral Palpable mass with TTP over left UOQ SN to nipple R38 L 38 cm BW R 24 L 24 cm Nipple to IMF R 18 L 17 cm     Assessment:     Left breast cancer UOQ Neoadjuvant chemotherapy S/p left lumpectomy, SLN    Plan:     Patient has undergone lumpectomy with clear margins with residual 3.3 cm IDC, 0/3 SLN.   Plan oncoplastic reconstruction via breast reduction. Reviewed reduction with anchor type  scars, drains, post operative visits and limitations, recovery. Diminished sensation nipple and breast skin, risk of nipple loss, wound healing problems, asymmetry. She will require XRT and smaller breast size may aid with this. Discussed will have some contraction of breast volume and increased firmness with radiation, less ptosis with aging. Discussed changes with wt gain, loss, aging. If she pursues partial mastectomy, highly encourage her to pursue breast reduction prior to XRT as risk of complications post would be very high. Additional risks including but not limited to bleeding, seroma, hematoma, damage to deeper structures, , unacceptable cosmetic result, DVT/PE, cardiopulmonary complications reviewed.   Irene Limbo, MD [facility] Plastic & Reconstructive Surgery [phone], pin [phone]

## 2016-02-20 ENCOUNTER — Ambulatory Visit (HOSPITAL_BASED_OUTPATIENT_CLINIC_OR_DEPARTMENT_OTHER): Payer: Federal, State, Local not specified - PPO | Admitting: Hematology and Oncology

## 2016-02-20 ENCOUNTER — Encounter: Payer: Self-pay | Admitting: Hematology and Oncology

## 2016-02-20 VITALS — BP 112/83 | HR 82 | Temp 98.7°F | Resp 20 | Ht 65.0 in | Wt 251.0 lb

## 2016-02-20 DIAGNOSIS — C50412 Malignant neoplasm of upper-outer quadrant of left female breast: Secondary | ICD-10-CM

## 2016-02-20 NOTE — Progress Notes (Signed)
 Patient Care Team: Ibethal Jaralla Shamleffer, MD as PCP - General (Internal Medicine)  SUMMARY OF ONCOLOGIC HISTORY:   Breast cancer of upper-outer quadrant of left female breast (HCC)   08/17/2015 Initial Diagnosis left breast biopsy 1:30 position: Invasive ductal carcinoma, grade 3, ER 0%, PR 0%, HER-2 negative ratio 1.48, Ki-67 90%, 3.1 cm tumor, axilla negative, T2 N0 stage II a clinical stage   09/19/2015 - 01/10/2016 Neo-Adjuvant Chemotherapy Neoadjuvant chemotherapy with dose dense Adriamycin and Cytoxan 4 followed by Taxol and carboplatin weekly 6   09/28/2015 Procedure  Left breast biopsy anterior third left upper outer quadrant: PASH;  upper inner quadrant: PASH   01/16/2016 Breast MRI significant response to chemotherapy with decrease in the tumor from 5.1 cm to 2.9 cm, also decrease a non-mass enhancement   02/13/2016 Surgery Left lumpectomy: IDC grade 3, 3.3 cm, margins negative, 0/3 lymph nodes negative, triple negative, T2 N0 stage II a pathologic stage    CHIEF COMPLIANT: Follow-up after recent left lumpectomy  INTERVAL HISTORY: Cindy Bradshaw is a 45 over the above-mentioned history of left breast cancer who underwent neoadjuvant chemotherapy followed by left lumpectomy. She is here today to discuss results accompanied by her husband. She reports that she is healing well from surgery. She is scheduled to undergo breast reduction on the right side tomorrow.  REVIEW OF SYSTEMS:   Constitutional: Denies fevers, chills or abnormal weight loss Eyes: Denies blurriness of vision Ears, nose, mouth, throat, and face: Denies mucositis or sore throat Respiratory: Denies cough, dyspnea or wheezes Cardiovascular: Denies palpitation, chest discomfort Gastrointestinal:  Denies nausea, heartburn or change in bowel habits Skin: Denies abnormal skin rashes Lymphatics: Denies new lymphadenopathy or easy bruising Neurological:Denies numbness, tingling or new weaknesses Behavioral/Psych:  Mood is stable, no new changes  Extremities: No lower extremity edema Breast:  Recent left lumpectomy All other systems were reviewed with the patient and are negative.  I have reviewed the past medical history, past surgical history, social history and family history with the patient and they are unchanged from previous note.  ALLERGIES:  is allergic to tape; dilaudid; ivp dye; percocet; and toradol.  MEDICATIONS:  Current Outpatient Prescriptions  Medication Sig Dispense Refill  . albuterol (PROVENTIL HFA;VENTOLIN HFA) 108 (90 Base) MCG/ACT inhaler Inhale 2 puffs into the lungs every 4 (four) hours as needed for wheezing or shortness of breath.    . HYDROcodone-acetaminophen (NORCO) 10-325 MG tablet Take 1-2 tablets by mouth every 6 (six) hours as needed for moderate pain or severe pain. 30 tablet 0  . triamcinolone cream (KENALOG) 0.1 % Apply 1 application topically 2 (two) times daily. (Patient taking differently: Apply 1 application topically 2 (two) times daily as needed (For eczema.). ) 160 g 1   No current facility-administered medications for this visit.   Facility-Administered Medications Ordered in Other Visits  Medication Dose Route Frequency Provider Last Rate Last Dose  . 0.9 %  sodium chloride infusion   Intravenous Once Heather F Boelter, NP        PHYSICAL EXAMINATION: ECOG PERFORMANCE STATUS: 1 - Symptomatic but completely ambulatory  Filed Vitals:   02/20/16 1549  BP: 112/83  Pulse: 82  Temp: 98.7 F (37.1 C)  Resp: 20   Filed Weights   02/20/16 1549  Weight: 251 lb (113.853 kg)    GENERAL:alert, no distress and comfortable SKIN: skin color, texture, turgor are normal, no rashes or significant lesions EYES: normal, Conjunctiva are pink and non-injected, sclera clear OROPHARYNX:no exudate, no erythema   and lips, buccal mucosa, and tongue normal  NECK: supple, thyroid normal size, non-tender, without nodularity LYMPH:  no palpable lymphadenopathy in the  cervical, axillary or inguinal LUNGS: clear to auscultation and percussion with normal breathing effort HEART: regular rate & rhythm and no murmurs and no lower extremity edema ABDOMEN:abdomen soft, non-tender and normal bowel sounds MUSCULOSKELETAL:no cyanosis of digits and no clubbing  NEURO: alert & oriented x 3 with fluent speech, no focal motor/sensory deficits EXTREMITIES: No lower extremity edema  LABORATORY DATA:  I have reviewed the data as listed   Chemistry      Component Value Date/Time   NA 142 01/21/2016 1627   NA 142 01/10/2016 1142   K 3.6 01/21/2016 1627   K 3.7 01/10/2016 1142   CL 110 01/21/2016 1627   CO2 25 01/21/2016 1627   CO2 25 01/10/2016 1142   BUN 14 01/21/2016 1627   BUN 13.2 01/10/2016 1142   CREATININE 0.67 01/25/2016 0522   CREATININE 0.9 01/10/2016 1142      Component Value Date/Time   CALCIUM 10.5* 01/21/2016 1627   CALCIUM 10.3 01/10/2016 1142   ALKPHOS 56 01/10/2016 1142   ALKPHOS 53 12/09/2015 0452   AST 35* 01/10/2016 1142   AST 178* 12/09/2015 0452   ALT 57* 01/10/2016 1142   ALT 142* 12/09/2015 0452   BILITOT 0.48 01/10/2016 1142   BILITOT 1.0 12/09/2015 0452       Lab Results  Component Value Date   WBC 12.0* 01/25/2016   HGB 11.3* 01/25/2016   HCT 34.6* 01/25/2016   MCV 90.3 01/25/2016   PLT 363 01/25/2016   NEUTROABS 2.5 01/10/2016   ASSESSMENT & PLAN:  Breast cancer of upper-outer quadrant of left female breast ([place]) Left lumpectomy 02/13/2016: IDC grade 3, 3.3 cm, margins negative, 0/3 lymph nodes negative, triple negative, T2 N0 stage II a pathologic stage  Left breast biopsy 08/17/2015:1:30 position: Invasive ductal carcinoma, grade 3, ER 0%, PR 0%, HER-2 negative ratio 1.48, Ki-67 90%, 3.1 cm tumor, axilla negative, T2 N0 stage II a clinical stage Breast biopsies x 2: 09/28/2015 : [place]  Treatment summary: 1. Completed Cycle 4 dose dense Adriamycin and Cytoxan on 10/31/15; completed cycle 3 Taxol and carboplatin,  completed 7 cycles of Taxol and treatment discontinued for poor tolerance. 2. left lumpectomy 02/13/2016   Treatment plan: 1. Adjuvant radiation 2. Adjuvant Xeloda 2 weeks on and one-week off 6 months followed by 3. Immunotherapy on clinical trial with Pembrolizumab   Prognosis: I discussed with the patient the patient still residual disease after neoadjuvant chemotherapy tend to have high risk of recurrence. It is for this reason I'm recommending further adjuvant chemotherapy and also consideration for participation in the immunotherapy clinical trial.  Return to clinic after radiation therapy is complete to start adjuvant Xeloda.  No orders of the defined types were placed in this encounter.   The patient has a good understanding of the overall plan. she agrees with it. she will call with any problems that may develop before the next visit here.   Rulon Eisenmenger, MD 02/20/2016

## 2016-02-20 NOTE — Assessment & Plan Note (Signed)
Left lumpectomy 02/13/2016: IDC grade 3, 3.3 cm, margins negative, 0/3 lymph nodes negative, triple negative, T2 N0 stage II a pathologic stage  Left breast biopsy 08/17/2015:1:30 position: Invasive ductal carcinoma, grade 3, ER 0%, PR 0%, HER-2 negative ratio 1.48, Ki-67 90%, 3.1 cm tumor, axilla negative, T2 N0 stage II a clinical stage Breast biopsies x 2: 09/28/2015 : [place]  Treatment summary: 1. Completed Cycle 4 dose dense Adriamycin and Cytoxan on 10/31/15; completed cycle 3 Taxol and carboplatin, completed 7 cycles of Taxol and treatment discontinued for poor tolerance.  Treatment plan: 1. Adjuvant radiation 2. Adjuvant Xeloda followed by 3. Immunotherapy on clinical trial with Pembrolizumab --------------------------------------------------------------------------------------------------------------------------------------- Prognosis: I discussed with the patient the patient still residual disease after neoadjuvant chemotherapy tend to have high risk of recurrence. It is for this reason I'm recommending further adjuvant chemotherapy and also consideration for participation in the immunotherapy clinical trial.

## 2016-02-21 ENCOUNTER — Ambulatory Visit (HOSPITAL_BASED_OUTPATIENT_CLINIC_OR_DEPARTMENT_OTHER): Payer: Federal, State, Local not specified - PPO | Admitting: Certified Registered"

## 2016-02-21 ENCOUNTER — Encounter (HOSPITAL_BASED_OUTPATIENT_CLINIC_OR_DEPARTMENT_OTHER)
Admission: RE | Disposition: A | Discharge status: Home / Self Care | Payer: Self-pay | Source: Ambulatory Visit | Attending: Plastic Surgery

## 2016-02-21 ENCOUNTER — Encounter (HOSPITAL_BASED_OUTPATIENT_CLINIC_OR_DEPARTMENT_OTHER): Payer: Self-pay | Admitting: *Deleted

## 2016-02-21 ENCOUNTER — Ambulatory Visit (HOSPITAL_COMMUNITY)
Admission: RE | Admit: 2016-02-21 | Discharge: 2016-02-22 | Disposition: A | Discharge status: Home / Self Care | Payer: Federal, State, Local not specified - PPO | Source: Ambulatory Visit | Attending: Plastic Surgery | Admitting: Plastic Surgery

## 2016-02-21 DIAGNOSIS — C50912 Malignant neoplasm of unspecified site of left female breast: Secondary | ICD-10-CM | POA: Diagnosis not present

## 2016-02-21 DIAGNOSIS — N62 Hypertrophy of breast: Secondary | ICD-10-CM | POA: Insufficient documentation

## 2016-02-21 DIAGNOSIS — Z9221 Personal history of antineoplastic chemotherapy: Secondary | ICD-10-CM | POA: Insufficient documentation

## 2016-02-21 DIAGNOSIS — G8929 Other chronic pain: Secondary | ICD-10-CM | POA: Insufficient documentation

## 2016-02-21 DIAGNOSIS — M549 Dorsalgia, unspecified: Secondary | ICD-10-CM | POA: Insufficient documentation

## 2016-02-21 HISTORY — PX: BREAST REDUCTION SURGERY: SHX8

## 2016-02-21 SURGERY — MAMMOPLASTY, REDUCTION
Anesthesia: General | Site: Breast | Laterality: Bilateral

## 2016-02-21 MED ORDER — NEOSTIGMINE METHYLSULFATE 5 MG/5ML IV SOSY
PREFILLED_SYRINGE | INTRAVENOUS | Status: AC
Start: 2016-02-21 — End: 2016-02-21
  Filled 2016-02-21: qty 5

## 2016-02-21 MED ORDER — ONDANSETRON 4 MG PO TBDP: 4.0000 mg | ORAL_TABLET | Freq: Four times a day (QID) | ORAL | Status: DC | PRN

## 2016-02-21 MED ORDER — ARTIFICIAL TEARS OP OINT
TOPICAL_OINTMENT | OPHTHALMIC | Status: AC
  Filled 2016-02-21: qty 3.5

## 2016-02-21 MED ORDER — MIDAZOLAM HCL 2 MG/2ML IJ SOLN
INTRAMUSCULAR | Status: AC
  Filled 2016-02-21: qty 2

## 2016-02-21 MED ORDER — HYDROCODONE-ACETAMINOPHEN 10-325 MG PO TABS: 1.0000 | ORAL_TABLET | ORAL | Status: DC | PRN

## 2016-02-21 MED ORDER — FENTANYL CITRATE (PF) 100 MCG/2ML IJ SOLN
50.0000 ug | INTRAMUSCULAR | Status: AC | PRN
  Administered 2016-02-21 (×4): 50 ug via INTRAVENOUS
  Administered 2016-02-21: 100 ug via INTRAVENOUS
  Administered 2016-02-21 (×2): 50 ug via INTRAVENOUS

## 2016-02-21 MED ORDER — DIPHENHYDRAMINE HCL 25 MG PO CAPS: 25.0000 mg | ORAL_CAPSULE | Freq: Four times a day (QID) | ORAL | Status: DC | PRN

## 2016-02-21 MED ORDER — GLYCOPYRROLATE 0.2 MG/ML IJ SOLN: 0.2000 mg | Freq: Once | INTRAMUSCULAR | Status: DC | PRN

## 2016-02-21 MED ORDER — FENTANYL CITRATE (PF) 100 MCG/2ML IJ SOLN
INTRAMUSCULAR | Status: AC
  Filled 2016-02-21: qty 2

## 2016-02-21 MED ORDER — CHLORHEXIDINE GLUCONATE 4 % EX LIQD: 60.0000 mL | Freq: Once | CUTANEOUS | Status: DC

## 2016-02-21 MED ORDER — DIPHENHYDRAMINE HCL 50 MG/ML IJ SOLN
25.0000 mg | Freq: Four times a day (QID) | INTRAMUSCULAR | Status: DC | PRN
  Administered 2016-02-21 – 2016-02-22 (×3): 25 mg via INTRAVENOUS
  Filled 2016-02-21 (×2): qty 1

## 2016-02-21 MED ORDER — TRAMADOL HCL 50 MG PO TABS
50.0000 mg | ORAL_TABLET | Freq: Four times a day (QID) | ORAL | Status: DC | PRN
  Filled 2016-02-21: qty 1

## 2016-02-21 MED ORDER — DEXAMETHASONE SODIUM PHOSPHATE 4 MG/ML IJ SOLN
INTRAMUSCULAR | Status: DC | PRN
  Administered 2016-02-21: 10 mg via INTRAVENOUS

## 2016-02-21 MED ORDER — GLYCOPYRROLATE 0.2 MG/ML IV SOSY
PREFILLED_SYRINGE | INTRAVENOUS | Status: DC | PRN
  Administered 2016-02-21: 0.4 mg via INTRAVENOUS

## 2016-02-21 MED ORDER — SODIUM CHLORIDE 0.9 % IV SOLN: 500.0000 mg | Freq: Three times a day (TID) | INTRAVENOUS | Status: DC | PRN

## 2016-02-21 MED ORDER — ONDANSETRON HCL 4 MG/2ML IJ SOLN: 4.0000 mg | Freq: Once | INTRAMUSCULAR | Status: DC | PRN

## 2016-02-21 MED ORDER — DIAZEPAM 5 MG PO TABS
5.0000 mg | ORAL_TABLET | Freq: Three times a day (TID) | ORAL | Status: DC | PRN
  Administered 2016-02-21: 5 mg via ORAL
  Filled 2016-02-21: qty 1

## 2016-02-21 MED ORDER — ROCURONIUM BROMIDE 50 MG/5ML IV SOLN
INTRAVENOUS | Status: AC
  Filled 2016-02-21: qty 1

## 2016-02-21 MED ORDER — LIDOCAINE HCL (CARDIAC) 20 MG/ML IV SOLN
INTRAVENOUS | Status: DC | PRN
  Administered 2016-02-21: 30 mg via INTRAVENOUS

## 2016-02-21 MED ORDER — ROCURONIUM BROMIDE 100 MG/10ML IV SOLN
INTRAVENOUS | Status: DC | PRN
  Administered 2016-02-21: 50 mg via INTRAVENOUS

## 2016-02-21 MED ORDER — PHENYLEPHRINE HCL 10 MG/ML IJ SOLN
INTRAMUSCULAR | Status: DC | PRN
  Administered 2016-02-21 (×2): 120 ug via INTRAVENOUS

## 2016-02-21 MED ORDER — LIDOCAINE 2% (20 MG/ML) 5 ML SYRINGE
INTRAMUSCULAR | Status: AC
  Filled 2016-02-21: qty 5

## 2016-02-21 MED ORDER — CEFAZOLIN SODIUM-DEXTROSE 2-4 GM/100ML-% IV SOLN
2.0000 g | INTRAVENOUS | Status: AC
  Administered 2016-02-21: 2 g via INTRAVENOUS

## 2016-02-21 MED ORDER — MORPHINE SULFATE (PF) 2 MG/ML IV SOLN
2.0000 mg | INTRAVENOUS | Status: DC | PRN
  Administered 2016-02-21: 2 mg via INTRAVENOUS
  Filled 2016-02-21: qty 1

## 2016-02-21 MED ORDER — PHENYLEPHRINE 40 MCG/ML (10ML) SYRINGE FOR IV PUSH (FOR BLOOD PRESSURE SUPPORT)
PREFILLED_SYRINGE | INTRAVENOUS | Status: AC
  Filled 2016-02-21: qty 10

## 2016-02-21 MED ORDER — LACTATED RINGERS IV SOLN
INTRAVENOUS | Status: DC
  Administered 2016-02-21 (×2): via INTRAVENOUS

## 2016-02-21 MED ORDER — ONDANSETRON HCL 4 MG/2ML IJ SOLN
INTRAMUSCULAR | Status: DC | PRN
  Administered 2016-02-21: 4 mg via INTRAVENOUS

## 2016-02-21 MED ORDER — METHOCARBAMOL 500 MG PO TABS
500.0000 mg | ORAL_TABLET | Freq: Three times a day (TID) | ORAL | Status: DC | PRN
  Administered 2016-02-21 – 2016-02-22 (×2): 500 mg via ORAL
  Filled 2016-02-21 (×2): qty 1

## 2016-02-21 MED ORDER — LABETALOL HCL 5 MG/ML IV SOLN
INTRAVENOUS | Status: AC
  Filled 2016-02-21: qty 4

## 2016-02-21 MED ORDER — LABETALOL HCL 5 MG/ML IV SOLN
INTRAVENOUS | Status: DC | PRN
  Administered 2016-02-21: 5 mg via INTRAVENOUS

## 2016-02-21 MED ORDER — MIDAZOLAM HCL 2 MG/2ML IJ SOLN
1.0000 mg | INTRAMUSCULAR | Status: DC | PRN
  Administered 2016-02-21: 2 mg via INTRAVENOUS

## 2016-02-21 MED ORDER — ONDANSETRON HCL 4 MG/2ML IJ SOLN: 4.0000 mg | Freq: Four times a day (QID) | INTRAMUSCULAR | Status: DC | PRN

## 2016-02-21 MED ORDER — CEFAZOLIN SODIUM-DEXTROSE 2-4 GM/100ML-% IV SOLN
INTRAVENOUS | Status: AC
  Filled 2016-02-21: qty 100

## 2016-02-21 MED ORDER — ALBUTEROL SULFATE HFA 108 (90 BASE) MCG/ACT IN AERS: 2.0000 | INHALATION_SPRAY | RESPIRATORY_TRACT | Status: DC | PRN

## 2016-02-21 MED ORDER — ONDANSETRON HCL 4 MG/2ML IJ SOLN
INTRAMUSCULAR | Status: AC
  Filled 2016-02-21: qty 2

## 2016-02-21 MED ORDER — SCOPOLAMINE 1 MG/3DAYS TD PT72: 1.0000 | MEDICATED_PATCH | Freq: Once | TRANSDERMAL | Status: DC | PRN

## 2016-02-21 MED ORDER — OXYCODONE HCL 5 MG PO TABS
5.0000 mg | ORAL_TABLET | ORAL | Status: DC | PRN
  Administered 2016-02-21 – 2016-02-22 (×3): 10 mg via ORAL
  Filled 2016-02-21 (×3): qty 2

## 2016-02-21 MED ORDER — HYDROCODONE-ACETAMINOPHEN 10-325 MG PO TABS
1.0000 | ORAL_TABLET | Freq: Four times a day (QID) | ORAL | Status: DC | PRN
  Administered 2016-02-21: 2 via ORAL
  Filled 2016-02-21 (×2): qty 2

## 2016-02-21 MED ORDER — FENTANYL CITRATE (PF) 100 MCG/2ML IJ SOLN
25.0000 ug | INTRAMUSCULAR | Status: DC | PRN
  Administered 2016-02-21 (×2): 50 ug via INTRAVENOUS

## 2016-02-21 MED ORDER — 0.9 % SODIUM CHLORIDE (POUR BTL) OPTIME
TOPICAL | Status: DC | PRN
  Administered 2016-02-21: 500 mL

## 2016-02-21 MED ORDER — DEXAMETHASONE SODIUM PHOSPHATE 10 MG/ML IJ SOLN
INTRAMUSCULAR | Status: AC
  Filled 2016-02-21: qty 1

## 2016-02-21 MED ORDER — PROPOFOL 10 MG/ML IV BOLUS
INTRAVENOUS | Status: DC | PRN
  Administered 2016-02-21: 200 mg via INTRAVENOUS

## 2016-02-21 MED ORDER — KCL IN DEXTROSE-NACL 20-5-0.45 MEQ/L-%-% IV SOLN
INTRAVENOUS | Status: DC
  Administered 2016-02-21: 16:00:00 via INTRAVENOUS
  Filled 2016-02-21: qty 1000

## 2016-02-21 MED ORDER — NEOSTIGMINE METHYLSULFATE 5 MG/5ML IV SOSY
PREFILLED_SYRINGE | INTRAVENOUS | Status: DC | PRN
  Administered 2016-02-21: 3 mg via INTRAVENOUS

## 2016-02-21 MED ORDER — GLYCOPYRROLATE 0.2 MG/ML IV SOSY
PREFILLED_SYRINGE | INTRAVENOUS | Status: AC
  Filled 2016-02-21: qty 3

## 2016-02-21 SURGICAL SUPPLY — 48 items
APPLIER CLIP 9.375 MED OPEN (MISCELLANEOUS)
BINDER BREAST LRG (GAUZE/BANDAGES/DRESSINGS) IMPLANT
BINDER BREAST MEDIUM (GAUZE/BANDAGES/DRESSINGS) IMPLANT
BINDER BREAST XLRG (GAUZE/BANDAGES/DRESSINGS) IMPLANT
BINDER BREAST XXLRG (GAUZE/BANDAGES/DRESSINGS) ×2 IMPLANT
BLADE SURG 10 STRL SS (BLADE) ×8 IMPLANT
BNDG GAUZE ELAST 4 BULKY (GAUZE/BANDAGES/DRESSINGS) ×4 IMPLANT
CANISTER SUCT 1200ML W/VALVE (MISCELLANEOUS) ×2 IMPLANT
CHLORAPREP W/TINT 26ML (MISCELLANEOUS) ×4 IMPLANT
CLIP APPLIE 9.375 MED OPEN (MISCELLANEOUS) IMPLANT
COVER BACK TABLE 60X90IN (DRAPES) IMPLANT
COVER MAYO STAND STRL (DRAPES) IMPLANT
DRAIN CHANNEL 15F RND FF W/TCR (WOUND CARE) ×4 IMPLANT
DRAPE LAPAROSCOPIC ABDOMINAL (DRAPES) IMPLANT
DRSG PAD ABDOMINAL 8X10 ST (GAUZE/BANDAGES/DRESSINGS) ×8 IMPLANT
ELECT COATED BLADE 2.86 ST (ELECTRODE) ×2 IMPLANT
ELECT REM PT RETURN 9FT ADLT (ELECTROSURGICAL) ×2
ELECTRODE REM PT RTRN 9FT ADLT (ELECTROSURGICAL) ×1 IMPLANT
EVACUATOR SILICONE 100CC (DRAIN) ×4 IMPLANT
GLOVE BIO SURGEON STRL SZ 6 (GLOVE) ×6 IMPLANT
GLOVE EXAM NITRILE EXT CUFF MD (GLOVE) ×2 IMPLANT
GLOVE SURG SS PI 7.0 STRL IVOR (GLOVE) ×4 IMPLANT
GOWN STRL REUS W/ TWL LRG LVL3 (GOWN DISPOSABLE) ×4 IMPLANT
GOWN STRL REUS W/TWL LRG LVL3 (GOWN DISPOSABLE) ×4
LIQUID BAND (GAUZE/BANDAGES/DRESSINGS) ×4 IMPLANT
NS IRRIG 1000ML POUR BTL (IV SOLUTION) ×2 IMPLANT
PACK BASIN DAY SURGERY FS (CUSTOM PROCEDURE TRAY) ×2 IMPLANT
PACK UNIVERSAL I (CUSTOM PROCEDURE TRAY) ×2 IMPLANT
PENCIL BUTTON HOLSTER BLD 10FT (ELECTRODE) ×2 IMPLANT
PIN SAFETY STERILE (MISCELLANEOUS) ×2 IMPLANT
SHEET MEDIUM DRAPE 40X70 STRL (DRAPES) ×4 IMPLANT
SLEEVE SCD COMPRESS KNEE MED (MISCELLANEOUS) ×2 IMPLANT
SPONGE LAP 18X18 X RAY DECT (DISPOSABLE) ×10 IMPLANT
STAPLER VISISTAT 35W (STAPLE) ×6 IMPLANT
SUT ETHILON 2 0 FS 18 (SUTURE) ×4 IMPLANT
SUT MNCRL AB 4-0 PS2 18 (SUTURE) ×8 IMPLANT
SUT VIC AB 3-0 PS1 18 (SUTURE) ×6
SUT VIC AB 3-0 PS1 18XBRD (SUTURE) ×6 IMPLANT
SUT VIC AB 3-0 SH 27 (SUTURE) ×1
SUT VIC AB 3-0 SH 27X BRD (SUTURE) ×1 IMPLANT
SUT VICRYL 4-0 PS2 18IN ABS (SUTURE) ×6 IMPLANT
SYR BULB IRRIGATION 50ML (SYRINGE) ×2 IMPLANT
TAPE MEASURE VINYL STERILE (MISCELLANEOUS) ×2 IMPLANT
TAPE STRIPS DRAPE STRL (GAUZE/BANDAGES/DRESSINGS) ×2 IMPLANT
TOWEL OR 17X24 6PK STRL BLUE (TOWEL DISPOSABLE) ×6 IMPLANT
TUBE CONNECTING 20X1/4 (TUBING) ×2 IMPLANT
UNDERPAD 30X30 (UNDERPADS AND DIAPERS) ×4 IMPLANT
YANKAUER SUCT BULB TIP NO VENT (SUCTIONS) ×2 IMPLANT

## 2016-02-21 NOTE — Anesthesia Preprocedure Evaluation (Addendum)
Anesthesia Evaluation  Patient identified by MRN, date of birth, ID band Patient awake    Reviewed: Allergy & Precautions, NPO status , Patient's Chart, lab work & pertinent test results  Airway Mallampati: II  TM Distance: >3 FB Neck ROM: Full    Dental  (+) Teeth Intact, Dental Advisory Given   Pulmonary    breath sounds clear to auscultation       Cardiovascular  Rhythm:Regular Rate:Normal     Neuro/Psych    GI/Hepatic   Endo/Other    Renal/GU      Musculoskeletal   Abdominal (+) + obese,   Peds  Hematology   Anesthesia Other Findings   Reproductive/Obstetrics                             Anesthesia Physical Anesthesia Plan  ASA: III  Anesthesia Plan: General   Post-op Pain Management:    Induction: Intravenous  Airway Management Planned: Oral ETT  Additional Equipment:   Intra-op Plan:   Post-operative Plan: Extubation in OR  Informed Consent: I have reviewed the patients History and Physical, chart, labs and discussed the procedure including the risks, benefits and alternatives for the proposed anesthesia with the patient or authorized representative who has indicated his/her understanding and acceptance.   Dental advisory given  Plan Discussed with: CRNA and Anesthesiologist  Anesthesia Plan Comments:         Anesthesia Quick Evaluation  

## 2016-02-21 NOTE — Interval H&P Note (Signed)
History and Physical Interval Note:  02/21/2016 10:43 AM  Cindy Bradshaw  has presented today for surgery, with the diagnosis of breast cancer left upper outer quad  The various methods of treatment have been discussed with the patient and family. After consideration of risks, benefits and other options for treatment, the patient has consented to  Procedure(s): MAMMARY REDUCTION  (BREAST)/Oncoplastic breast reconstruction (Bilateral) as a surgical intervention .  The patient's history has been reviewed, patient examined, no change in status, stable for surgery.  I have reviewed the patient's chart and labs.  Questions were answered to the patient's satisfaction.     [name]

## 2016-02-21 NOTE — Op Note (Signed)
Operative Note   DATE OF OPERATION: 6.20.17  LOCATION: [facility]-observation  SURGICAL DIVISION: Plastic Surgery  PREOPERATIVE DIAGNOSES:  1. Left breast cancer 2. Macromastia 3. Chronic back pain thoracic 4. Neoadjuvant chemotherapy  POSTOPERATIVE DIAGNOSES:  same  PROCEDURE:  1. Oncoplastic reconstruction via bilateral breast reduction  SURGEON: Irene Limbo MD MBA  ASSISTANT: none  ANESTHESIA:  General.   EBL: [id number]  COMPLICATIONS: None immediate.   INDICATIONS FOR PROCEDURE:  The patient, Cindy Bradshaw, is a 45 y.o. female born on Apr 22, 1971, is here for staged reconstruction following neoadjuvant chemotherapy and left lumpectomy. She is now 1 week post operative from lumpectomy and sentinel lymph node biopsy with clear margins.    FINDINGS: Large seroma cavity over left lateral breast in lumpectomy cavity with significant edema of this breast. Right reduction 898 g Left 944 g  DESCRIPTION OF PROCEDURE:  The patient was marked standing in the preoperative area to mark sternal notch, chest midline, anterior axillary lines, inframammary folds. The location of new nipple areolar complex was marked at level of on inframammary fold on anterior surface breast by palpation. This was marked symmetric over bilateral breasts. With aid of Wise pattern marker, location of new nipple areolar complex and vertical limbs (8 cm) were marked. The patient was taken to the operating room. SCDs were placed and IV antibiotics were given. The patient's operative site was prepped and draped in a sterile fashion. A time out was performed and all information was confirmed to be correct.   Over left breast, superomedial pedicle marked and nipple areolar complex marked with 50 mm diameter marker. Pedicle deepithlialized and developedto 5-6 cm thickness and dissected toward chest wall until tension free rotation of pedicle achieved. Lumpectomy cavity was over lateral breast with large seroma  cavity drained. No additional tissue resected in this area. Inferior pole breast tissue resected. Medial and lateral flaps developed. Breast tailor tacked closed.  I then directed attention to right breast where superomedial pedicle designed. The pedicle was deepithelialized and developed to similar thickness. Inferior pole and superior pole breast tissue excised. Skin and soft tissue flaps developed until able to be redraped over pedicle without tension. Patient brought to upright sitting position and assessed for symmetry. Patent returned to supine position and breast cavities irrigated and hemostasis obtained. 15 Fr JP placed in each breast and secured with 2-0 nylon. Closure completed similarly over bilateral breasts with 3-0 vicryl to approximate dermis along inframammary fold and vertical limb. NAC inset with 4-0 vicryl in dermis. Skin closure completed with 4-0 monocryl subcuticular throughout.Tissue adhesive applied. Dry dressing breast binder applied.  The patient was allowed to wake from anesthesia, extubated and taken to the recovery room in satisfactory condition.   SPECIMENS: Right and left breast tissue  DRAINS: 15 Fr JP in each breast  Irene Limbo, MD [facility] Plastic & Reconstructive Surgery [phone], pin [phone]

## 2016-02-21 NOTE — Transfer of Care (Signed)
Immediate Anesthesia Transfer of Care Note  Patient: Cindy Bradshaw  Procedure(s) Performed: Procedure(s): MAMMARY REDUCTION  (BREAST)/Oncoplastic breast reconstruction (Bilateral)  Patient Location: PACU  Anesthesia Type:General  Level of Consciousness: awake and patient cooperative  Airway & Oxygen Therapy: Patient Spontanous Breathing and Patient connected to face mask oxygen  Post-op Assessment: Report given to RN and Post -op Vital signs reviewed and stable  Post vital signs: Reviewed and stable  Last Vitals:  Filed Vitals:   02/21/16 0921  BP: 125/93  Pulse: 78  Temp: 36.8 C  Resp: 18    Last Pain:  Filed Vitals:   02/21/16 0926  PainSc: 3       Patients Stated Pain Goal: 1 ([id number])  Complications: No apparent anesthesia complications

## 2016-02-21 NOTE — Anesthesia Procedure Notes (Signed)
Procedure Name: Intubation Performed by: Tawni Millers Pre-anesthesia Checklist: Patient identified, Emergency Drugs available, Suction available and Patient being monitored Patient Re-evaluated:Patient Re-evaluated prior to inductionOxygen Delivery Method: Circle system utilized Preoxygenation: Pre-oxygenation with 100% oxygen Intubation Type: IV induction Ventilation: Mask ventilation without difficulty Laryngoscope Size: Mac and 3 Tube type: Oral Tube size: 7.0 mm Number of attempts: 1 Airway Equipment and Method: Stylet and Oral airway Placement Confirmation: ETT inserted through vocal cords under direct vision,  positive ETCO2 and breath sounds checked- equal and bilateral Tube secured with: Tape Dental Injury: Teeth and Oropharynx as per pre-operative assessment

## 2016-02-21 NOTE — Anesthesia Postprocedure Evaluation (Signed)
Anesthesia Post Note  Patient: Cindy Bradshaw  Procedure(s) Performed: Procedure(s) (LRB): MAMMARY REDUCTION  (BREAST)/Oncoplastic breast reconstruction (Bilateral)  Patient location during evaluation: PACU Anesthesia Type: General Level of consciousness: awake and alert Pain management: pain level controlled Vital Signs Assessment: post-procedure vital signs reviewed and stable Respiratory status: spontaneous breathing, nonlabored ventilation, respiratory function stable and patient connected to face mask oxygen Cardiovascular status: blood pressure returned to baseline and stable Postop Assessment: no signs of nausea or vomiting Anesthetic complications: no    Last Vitals:  Filed Vitals:   02/21/16 1540 02/21/16 1545  BP: 135/84 138/77  Pulse: 97 102  Temp:    Resp: 18 15    Last Pain:  Filed Vitals:   02/21/16 1604  PainSc: Asleep                 Dalessandro Baldyga A

## 2016-02-22 ENCOUNTER — Encounter (HOSPITAL_BASED_OUTPATIENT_CLINIC_OR_DEPARTMENT_OTHER): Payer: Self-pay | Admitting: Plastic Surgery

## 2016-02-22 DIAGNOSIS — C50912 Malignant neoplasm of unspecified site of left female breast: Secondary | ICD-10-CM | POA: Diagnosis not present

## 2016-02-22 MED ORDER — METHOCARBAMOL 500 MG PO TABS: 500.0000 mg | ORAL_TABLET | Freq: Three times a day (TID) | ORAL | Status: DC | PRN

## 2016-02-22 MED ORDER — OXYCODONE HCL 5 MG PO TABS: 5.0000 mg | ORAL_TABLET | ORAL | Status: DC | PRN

## 2016-02-22 MED ORDER — DIPHENHYDRAMINE HCL 25 MG PO CAPS: 25.0000 mg | ORAL_CAPSULE | Freq: Four times a day (QID) | ORAL | Status: DC | PRN

## 2016-02-22 NOTE — Anesthesia Postprocedure Evaluation (Signed)
Anesthesia Post Note  Patient: Cindy Bradshaw  Procedure(s) Performed: Procedure(s) (LRB): MAMMARY REDUCTION  (BREAST)/Oncoplastic breast reconstruction (Bilateral)  Patient location during evaluation: PACU Anesthesia Type: General Level of consciousness: awake, awake and alert and oriented Pain management: pain level controlled Vital Signs Assessment: post-procedure vital signs reviewed and stable Respiratory status: spontaneous breathing, respiratory function stable and nonlabored ventilation Cardiovascular status: blood pressure returned to baseline Anesthetic complications: no    Last Vitals:  Filed Vitals:   02/22/16 0600 02/22/16 0645  BP:  130/70  Pulse: 101 103  Temp:  36.7 C  Resp:      Last Pain:  Filed Vitals:   02/22/16 0820  PainSc: 6                  Lisette Mancebo COKER

## 2016-03-05 ENCOUNTER — Other Ambulatory Visit: Payer: Self-pay | Admitting: Nurse Practitioner

## 2016-03-09 NOTE — Progress Notes (Addendum)
Location of Breast Cancer Left Breast   1:30 o'clock position Upper outer quadrant   Histology per Pathology Report: 08/17/15: Left Breast  U/S Bx: grade II  Invasive ductal carcinoma  Receptor Status: ER(neg), PR (neg), Her2-neu (neg ratio 1.28), Ki-()  Did patient present with symptoms (if so, please note symptoms) or was this found on screening mammography?:   Past/Anticipated interventions by surgeon, if any:, 02/21/16:   Dr. Arnoldo Hooker [place],MD =Left  Mammoplasty, =Fibrocystic change no malignancy,  2. Right breast=mammoplasty fibro cystic changes,adenosis,  And usual ductal hyperplasia, no malignancy  02/13/2016 Left breast Lumpectomy, Dr. Marland Kitchen [place],MD    Past/Anticipated interventions by medical oncology, if any: Chemotherapy :Dr. Lindi Adie, MD   09/19/2015 - 01/10/2016 Neo-Adjuvant Chemotherapy Neoadjuvant chemotherapy with dose dense Adriamycin and Cytoxan 4 followed by Taxol and carboplatin weekly 7 d/c due to poor tolerance Adjuvant Xeloda 2 weeks on one week off x 6 months,folowed by Immunotherapy on clinical trial with Pembroliaumab, return to clinic after radiation therapy is completed to start adjuvant Xeloda         Lymphedema issues, if any:  NO  Pain issues, if VEH:MCNOBSJ back pain /thoracic   SAFETY ISSUES: No   Prior radiation?  NO  Pacemaker/ICD? NO  Possible current pregnancy? NO  Is the patient on methotrexate? NO  Current Complaints / other details: Married, G3P2, menses age 32,   1 miscarriage, birth control pills  Since age 12,quit  78 years ago, non smoker, no tobacco use, no alcohol or drug use,  pataient itching all over, takes benadryl  Mother Lung cancer, smoker, had chemo and radiation mets to brain, Maternal Uncle Bone cancer both deceased,     Rebecca Eaton, RN 03/09/2016,12:43 PM BP 126/82 mmHg  Pulse 96  Temp(Src) 98.1 F (36.7 C) (Oral)  Resp 17  Ht 5' 5.5" (1.664 m)  Wt 244 lb 12.8 oz (111.041 kg)  BMI 40.10 kg/m2  Wt  Readings from Last 3 Encounters:  03/12/16 244 lb 12.8 oz (111.041 kg)  02/21/16 252 lb (114.306 kg)  02/20/16 251 lb (113.853 kg)

## 2016-03-12 ENCOUNTER — Ambulatory Visit
Admission: RE | Admit: 2016-03-12 | Discharge: 2016-03-12 | Disposition: A | Discharge status: Home / Self Care | Payer: Federal, State, Local not specified - PPO | Source: Ambulatory Visit | Attending: Radiation Oncology | Admitting: Radiation Oncology

## 2016-03-12 ENCOUNTER — Encounter: Payer: Self-pay | Admitting: Radiation Oncology

## 2016-03-12 VITALS — BP 126/82 | HR 96 | Temp 98.1°F | Resp 17 | Ht 65.5 in | Wt 244.8 lb

## 2016-03-12 DIAGNOSIS — Z801 Family history of malignant neoplasm of trachea, bronchus and lung: Secondary | ICD-10-CM | POA: Diagnosis not present

## 2016-03-12 DIAGNOSIS — C50212 Malignant neoplasm of upper-inner quadrant of left female breast: Secondary | ICD-10-CM | POA: Diagnosis present

## 2016-03-12 DIAGNOSIS — C50012 Malignant neoplasm of nipple and areola, left female breast: Secondary | ICD-10-CM

## 2016-03-12 DIAGNOSIS — Z808 Family history of malignant neoplasm of other organs or systems: Secondary | ICD-10-CM | POA: Diagnosis not present

## 2016-03-12 DIAGNOSIS — C50412 Malignant neoplasm of upper-outer quadrant of left female breast: Secondary | ICD-10-CM | POA: Diagnosis not present

## 2016-03-12 DIAGNOSIS — Z510 Encounter for antineoplastic radiation therapy: Secondary | ICD-10-CM | POA: Diagnosis not present

## 2016-03-12 DIAGNOSIS — Z79899 Other long term (current) drug therapy: Secondary | ICD-10-CM | POA: Diagnosis not present

## 2016-03-12 DIAGNOSIS — Z803 Family history of malignant neoplasm of breast: Secondary | ICD-10-CM | POA: Insufficient documentation

## 2016-03-12 MED ORDER — DIPHENHYDRAMINE HCL 25 MG PO CAPS
25.0000 mg | ORAL_CAPSULE | Freq: Once | ORAL | Status: AC
  Administered 2016-03-12: 25 mg via ORAL
  Filled 2016-03-12: qty 1

## 2016-03-12 NOTE — Progress Notes (Signed)
Radiation Oncology         [phone] ________________________________  Name: Cindy Bradshaw MRN: [id number]  Date: 03/12/2016  DOB: 06/29/1971  IR:CVELFYB [name], MD  Nicholas Lose, MD     REFERRING PHYSICIAN: Nicholas Lose, MD   DIAGNOSIS: The primary encounter diagnosis was Malignant neoplasm of upper-inner quadrant of left female breast ([place]). Diagnoses of Malignant neoplasm of areola of left breast in female [facility]) and Breast cancer of upper-outer quadrant of left female breast [facility]) were also pertinent to this visit.  Stage IIA (ypT2, ypN0) invasive ductal carcinoma of the left breast  HISTORY OF PRESENT ILLNESS: Cindy Bradshaw is a 45 y.o. female who had a two weeks history of a tender, self palpated, left breast mass in December 2016. She had a mammogram and ultrasound on 08/12/15. This revealed a 3.1 x 2.4 x 1.3 cm mass in the 1:30 o'clock position of the left breast. Left axilla showed no adenopathy.  Biopsy on 08/16/16 showed grade 3 invasive ductal carcinoma (ER/PR negative, HER2 negative, Ki67 90%) (clinical stage IIA T2N0).  MRI of the bilateral breasts on 09/14/15 revealed a 0.9 cm enhancing mass in the UIQ of the right breast and the left breast showed a mass measuring 5.1 x 4.3 x 4.4 cm. There was also non-mass enhancement, measuring approximately 11 cm, surrounding this left breast mass.  On 09/28/15, she had bilateral breast biopsies to evaluate the extent of disease noted on the breast MRI. Both biopsies revealed [place].  From 09/19/15 - 01/10/16, the patient underwent neoadjuvant chemotherapy. This was dose dense Adriamycin and Cytoxan x4 followed by 3 cycles of Taxol and Carboplatin weekly and she only continued with 4 more cycles of Taxol due to poor tolerance.  Repeat bilateral breast MRI on 01/14/16 revealed no masses in the right breast and the left breast mass has decreased in size to 2.9 x 1.8 x 1.7 cm and the area of non-mass enhancement has decreased as  well.  On 02/13/16, the patient had a left lumpectomy and left axillary sentinel lymph node biopsy. This revealed grade 3 invasive ductal carcinoma spanning 3.3 cm with negative margins (triple negative), and 0/3 lymph nodes negative.  The patient completed bilateral mammoplasty on 02/21/16 and presents today to discuss the role of radiation in the management of her disease.  PREVIOUS RADIATION THERAPY: No  PAST MEDICAL HISTORY:  Past Medical History  Diagnosis Date  . Cancer ([place])   . Migraine   . Asthma   . Breast cancer ([place])   . Arthritis   . Back disorder     degenerative disk disease  . Pap smear for cervical cancer screening 08/12/15  . Mammogram abnormal 08/24/15    first  . GERD (gastroesophageal reflux disease)        PAST SURGICAL HISTORY: Past Surgical History  Procedure Laterality Date  . Cholecystectomy    . Cesarean section    . Portacath placement [name] 09/12/2015    Procedure: INSERTION PORT-A-CATH;  Surgeon: Excell Seltzer, MD;  Location: [facility];  Service: General;  Laterality: [name];  . Cardiac catheterization [name] 01/24/2016    Procedure: Left Heart Cath and Coronary Angiography;  Surgeon: Leonie Man, MD;  Location: [place] [facility];  Service: Cardiovascular;  Laterality: [name];  . Breast lumpectomy with radioactive seed and sentinel lymph node biopsy Left 02/13/2016    Procedure: BREAST LUMPECTOMY WITH RADIOACTIVE SEED AND SENTINEL LYMPH NODE BIOPSY;  Surgeon: Excell Seltzer, MD;  Location: [place];  Service: General;  Laterality: Left;  . Peripheral vascular catheterization Right 02/13/2016    Procedure: PORTA CATH REMOVAL;  Surgeon: Excell Seltzer, MD;  Location: [place];  Service: General;  Laterality: Right;  . Breast reduction surgery Bilateral 02/21/2016    Procedure: MAMMARY REDUCTION  (BREAST)/Oncoplastic breast reconstruction;  Surgeon: Irene Limbo, MD;  Location: [place];  Service:  Plastics;  Laterality: Bilateral;     FAMILY HISTORY:  Family History  Problem Relation Age of Onset  . Lung cancer Mother 37    2 different types of lung cancer; metastasis to brain; smoker  . Other Mother     hx of hysterectomy for unspecified reason  . Bone cancer Maternal Uncle     dx. early 30s  . Breast cancer Maternal Grandmother     dx. early 72s, s/p mastectomy  . Cancer Paternal Grandfather     unspecified type of cancer, dx. late 82s  . Congestive Heart Failure Father     smoker  . Stroke Father   . Cirrhosis Maternal Grandfather   . Heart Problems Maternal Grandfather   . Lung cancer Other     (maternal great uncle; MGM's brother); had a coal stove  . Cancer Cousin     unspecified type; d. early age (paternal first cousin once-removed)  . Cancer Cousin     dx. as a kid; in remission today; (paternal 2nd cousin)  . Cancer Cousin     unspecified type; d. early 54s; (maternal 1st cousin)     SOCIAL HISTORY:  reports that she has never smoked. She has never used smokeless tobacco. She reports that she drinks alcohol. She reports that she does not use illicit drugs.   ALLERGIES: Tape; Dilaudid; Ivp dye; Percocet; and Toradol   MEDICATIONS:  Current Outpatient Prescriptions  Medication Sig Dispense Refill  . albuterol (PROVENTIL HFA;VENTOLIN HFA) 108 (90 Base) MCG/ACT inhaler Inhale 2 puffs into the lungs every 4 (four) hours as needed for wheezing or shortness of breath.    . diphenhydrAMINE (BENADRYL) 25 mg capsule Take 1 capsule (25 mg total) by mouth every 6 (six) hours as needed for itching. 30 capsule 0  . methocarbamol (ROBAXIN) 500 MG tablet Take 1 tablet (500 mg total) by mouth every 8 (eight) hours as needed for muscle spasms. 30 tablet 0  . triamcinolone cream (KENALOG) 0.1 % Apply 1 application topically 2 (two) times daily. (Patient taking differently: Apply 1 application topically 2 (two) times daily as needed (For eczema.). ) 160 g 1  .  Cyclobenzaprine HCl (FLEXERIL PO) 1 tablet. Reported on 03/12/2016    . ibuprofen (ADVIL,MOTRIN) 200 MG tablet Take 1 tablet by mouth. Reported on 03/12/2016    . morphine (MSIR) 15 MG tablet Take 15 mg by mouth as needed. Reported on 03/12/2016    . oxyCODONE (OXY IR/ROXICODONE) 5 MG immediate release tablet Take 1-2 tablets (5-10 mg total) by mouth every 4 (four) hours as needed for moderate pain or severe pain. (Patient not taking: Reported on 03/12/2016) 50 tablet 0   No current facility-administered medications for this encounter.   Facility-Administered Medications Ordered in Other Encounters  Medication Dose Route Frequency Provider Last Rate Last Dose  . 0.9 %  sodium chloride infusion   Intravenous Once Laurie Panda, NP         REVIEW OF SYSTEMS: On review of systems, the patient reports that she is doing well overall. She denies any chest pain, shortness of breath, cough,  fevers, chills, night sweats, unintended weight changes. She denies any bowel or bladder disturbances, and denies abdominal pain, nausea or vomiting. She denies any new musculoskeletal or joint aches or pains. However, she reports occasional bilateral breast pain and pruritus of her entire body. She was given benadryl in the clinic.  PHYSICAL EXAM:  height is 5' 5.5" (1.664 m) and weight is 244 lb 12.8 oz (111.041 kg). Her oral temperature is 98.1 F (36.7 C). Her blood pressure is 126/82 and her pulse is 96. Her respiration is 17.   In general this is a well appearing African-American female in no acute distress. She is alert and oriented x4 and appropriate throughout the examination. HEENT reveals that the patient is normocephalic, atraumatic. EOMs are intact. PERRLA. Skin is intact without any evidence of gross lesions. Cardiovascular exam reveals a regular rate and rhythm, no clicks rubs or murmurs are auscultated. Chest is clear to auscultation bilaterally. Breast Exam: Well healed bilateral excisions. She has a  faint well healing incision in the UOQ of the left breast. No palpable masses appreciated. No axillary adenopathy on either side. Lymphatic assessment is performed and does not reveal any adenopathy in the cervical, supraclavicular, axillary, or inguinal chains. Abdomen has active bowel sounds in all quadrants and is intact. The abdomen is soft, non tender, non distended. Lower extremities are negative for pretibial pitting edema, deep calf tenderness, cyanosis or clubbing.  ECOG = 0  0 - Asymptomatic (Fully active, able to carry on all predisease activities without restriction)  1 - Symptomatic but completely ambulatory (Restricted in physically strenuous activity but ambulatory and able to carry out work of a light or sedentary nature. For example, light housework, office work)  2 - Symptomatic, <50% in bed during the day (Ambulatory and capable of all self care but unable to carry out any work activities. Up and about more than 50% of waking hours)  3 - Symptomatic, >50% in bed, but not bedbound (Capable of only limited self-care, confined to bed or chair 50% or more of waking hours)  4 - Bedbound (Completely disabled. Cannot carry on any self-care. Totally confined to bed or chair)  5 - Death   Eustace Pen MM, Creech RH, Tormey DC, et al. [phone]). "Toxicity and response criteria of the [facility] Group". [place] Oncol. 5 (6): 649-55    LABORATORY DATA:  Lab Results  Component Value Date   WBC 12.0* 01/25/2016   HGB 11.3* 01/25/2016   HCT 34.6* 01/25/2016   MCV 90.3 01/25/2016   PLT 363 01/25/2016   Lab Results  Component Value Date   NA 142 01/21/2016   K 3.6 01/21/2016   CL 110 01/21/2016   CO2 25 01/21/2016   Lab Results  Component Value Date   ALT 57* 01/10/2016   AST 35* 01/10/2016   ALKPHOS 56 01/10/2016   BILITOT 0.48 01/10/2016      RADIOGRAPHY: Nm Sentinel Node Inj-no Rpt (breast)  02/13/2016  CLINICAL DATA: ancer left breast Sulfur colloid was  injected intradermally by the nuclear medicine technologist for breast cancer sentinel node localization.   Mm Breast Surgical Specimen  02/13/2016  CLINICAL DATA:  Status post left lumpectomy EXAM: SPECIMEN RADIOGRAPH OF THE LEFT BREAST COMPARISON:  Previous exam(s). FINDINGS: Status post excision of the left breast. The radioactive seed and biopsy marker clip are present, completely intact, and were marked for pathology. Findings were communicated to the operating room via telephone at the time of interpretation. IMPRESSION: Specimen radiograph  of the left breast. Electronically Signed   By: Pamelia Hoit M.D.   On: 02/13/2016 12:44       IMPRESSION: Stage IIA (ypT2, ypN0) invasive ductal carcinoma of the left breast  I spoke to the patient today regarding her diagnosis and options for treatment. We discussed the equivalence in terms of survival and local failure between mastectomy and breast conservation. We discussed the role of radiation in decreasing local failures in patients who undergo lumpectomy. We discussed the process of CT simulation and the placement tattoos. We discussed 6 1/2 weeks of treatment as an outpatient. This would correspond to tangent radiation fields. We discussed the possibility of asymptomatic lung damage. We discussed the low likelihood of secondary malignancies. We discussed the possible side effects including but not limited to skin redness, fatigue, permanent skin darkening, and breast swelling.  We discussed the use of cardiac sparing with deep inspiration breath hold if needed.  PLAN: The patient agreed to proceed with radiation treatment. A consent form was signed and placed in her medical chart. CT simulation will be scheduled at a later date.  After radiation, the patient is expected to begin adjuvant Xeloda 2 weeks on and one-week off 6 months followed by an immunotherapy clinical trial with Pembrolizumab.  ------------------------------------------------  Jodelle Gross, MD, PhD  This document serves as a record of services personally performed by Kyung Rudd, MD. It was created on his behalf by Darcus Austin, a trained medical scribe. The creation of this record is based on the scribe's personal observations and the provider's statements to them. This document has been checked and approved by the attending provider.

## 2016-03-12 NOTE — Progress Notes (Signed)
Please see the Nurse Progress Note in the MD Initial Consult Encounter for this patient. 

## 2016-03-14 ENCOUNTER — Encounter: Payer: Self-pay | Admitting: *Deleted

## 2016-03-14 NOTE — Progress Notes (Signed)
[place] Psychosocial Distress Screening Clinical Social Work  Clinical Social Work was referred by distress screening protocol.  The patient scored a 5 on the Psychosocial Distress Thermometer which indicates moderate distress. Clinical Social Worker reviewed chart and all items of concern were physical in nature and addressed by MD as needed. No psychosocial needs identified.   ONCBCN DISTRESS SCREENING 03/12/2016  Screening Type Initial Screening  Distress experienced in past week (1-10) 5  Physical Problem type Pain;Nausea/vomiting;Sleep/insomnia;Loss of appetitie;Tingling hands/feet;Skin dry/itchy  Physician notified of physical symptoms Yes  Referral to clinical social work Yes    Clinical Social Worker follow up needed: No.  If yes, follow up plan:  Cindy Bradshaw, [place]  [facility] Phone: [phone] Fax: [phone]

## 2016-03-15 ENCOUNTER — Emergency Department (HOSPITAL_COMMUNITY)
Admission: EM | Admit: 2016-03-15 | Discharge: 2016-03-15 | Disposition: A | Discharge status: Home / Self Care | Payer: Federal, State, Local not specified - PPO | Attending: Emergency Medicine | Admitting: Emergency Medicine

## 2016-03-15 ENCOUNTER — Emergency Department (HOSPITAL_COMMUNITY): Payer: Federal, State, Local not specified - PPO

## 2016-03-15 ENCOUNTER — Encounter (HOSPITAL_COMMUNITY): Payer: Self-pay | Admitting: Neurology

## 2016-03-15 DIAGNOSIS — R064 Hyperventilation: Secondary | ICD-10-CM | POA: Insufficient documentation

## 2016-03-15 DIAGNOSIS — R0602 Shortness of breath: Secondary | ICD-10-CM | POA: Diagnosis not present

## 2016-03-15 DIAGNOSIS — R079 Chest pain, unspecified: Secondary | ICD-10-CM | POA: Diagnosis present

## 2016-03-15 DIAGNOSIS — J45909 Unspecified asthma, uncomplicated: Secondary | ICD-10-CM | POA: Diagnosis not present

## 2016-03-15 DIAGNOSIS — Z853 Personal history of malignant neoplasm of breast: Secondary | ICD-10-CM | POA: Diagnosis not present

## 2016-03-15 LAB — I-STAT TROPONIN, ED: Troponin i, poc: 0.01 ng/mL (ref 0.00–0.08)

## 2016-03-15 LAB — CBC WITH DIFFERENTIAL/PLATELET
BASOS PCT: 0 %
Basophils Absolute: 0 10*3/uL (ref 0.0–0.1)
Eosinophils Absolute: 0.3 10*3/uL (ref 0.0–0.7)
Eosinophils Relative: 4 %
HEMATOCRIT: 37.2 % (ref 36.0–46.0)
HEMOGLOBIN: 11.6 g/dL — AB (ref 12.0–15.0)
LYMPHS ABS: 1.8 10*3/uL (ref 0.7–4.0)
LYMPHS PCT: 29 %
MCH: 28.6 pg (ref 26.0–34.0)
MCHC: 31.2 g/dL (ref 30.0–36.0)
MCV: 91.9 fL (ref 78.0–100.0)
MONOS PCT: 9 %
Monocytes Absolute: 0.5 10*3/uL (ref 0.1–1.0)
NEUTROS ABS: 3.5 10*3/uL (ref 1.7–7.7)
NEUTROS PCT: 57 %
Platelets: 352 10*3/uL (ref 150–400)
RBC: 4.05 MIL/uL (ref 3.87–5.11)
RDW: 14.1 % (ref 11.5–15.5)
WBC: 6.1 10*3/uL (ref 4.0–10.5)

## 2016-03-15 LAB — BASIC METABOLIC PANEL
Anion gap: 5 (ref 5–15)
BUN: 14 mg/dL (ref 6–20)
CHLORIDE: 109 mmol/L (ref 101–111)
CO2: 25 mmol/L (ref 22–32)
CREATININE: 0.89 mg/dL (ref 0.44–1.00)
Calcium: 10 mg/dL (ref 8.9–10.3)
GFR calc non Af Amer: >60 mL/min (ref >60)
GLUCOSE: 94 mg/dL (ref 65–99)
Potassium: 3.5 mmol/L (ref 3.5–5.1)
Sodium: 139 mmol/L (ref 135–145)

## 2016-03-15 LAB — I-STAT CHEM 8, ED
BUN: 19 mg/dL (ref 6–20)
CHLORIDE: 105 mmol/L (ref 101–111)
CREATININE: 1 mg/dL (ref 0.44–1.00)
Calcium, Ion: 1.33 mmol/L — ABNORMAL HIGH (ref 1.13–1.30)
Glucose, Bld: 92 mg/dL (ref 65–99)
HEMATOCRIT: 36 % (ref 36.0–46.0)
Hemoglobin: 12.2 g/dL (ref 12.0–15.0)
POTASSIUM: 3.6 mmol/L (ref 3.5–5.1)
SODIUM: 142 mmol/L (ref 135–145)
TCO2: 25 mmol/L (ref 0–100)

## 2016-03-15 LAB — BRAIN NATRIURETIC PEPTIDE: B Natriuretic Peptide: 20.2 pg/mL (ref 0.0–100.0)

## 2016-03-15 MED ORDER — PREDNISONE 20 MG PO TABS
60.0000 mg | ORAL_TABLET | Freq: Once | ORAL | Status: AC
  Administered 2016-03-15: 60 mg via ORAL
  Filled 2016-03-15: qty 3

## 2016-03-15 MED ORDER — DIPHENHYDRAMINE HCL 50 MG/ML IJ SOLN
25.0000 mg | Freq: Once | INTRAMUSCULAR | Status: DC
  Filled 2016-03-15: qty 1

## 2016-03-15 MED ORDER — ONDANSETRON HCL 4 MG/2ML IJ SOLN
4.0000 mg | Freq: Once | INTRAMUSCULAR | Status: AC
  Administered 2016-03-15: 4 mg via INTRAVENOUS
  Filled 2016-03-15: qty 2

## 2016-03-15 MED ORDER — MORPHINE SULFATE (PF) 4 MG/ML IV SOLN
4.0000 mg | Freq: Once | INTRAVENOUS | Status: AC
  Administered 2016-03-15: 4 mg via INTRAVENOUS
  Filled 2016-03-15: qty 1

## 2016-03-15 MED ORDER — TECHNETIUM TC 99M DIETHYLENETRIAME-PENTAACETIC ACID: 30.0000 | Freq: Once | INTRAVENOUS | Status: DC | PRN

## 2016-03-15 MED ORDER — LORAZEPAM 2 MG/ML IJ SOLN
0.5000 mg | INTRAMUSCULAR | Status: DC | PRN
  Administered 2016-03-15: 0.5 mg via INTRAVENOUS
  Filled 2016-03-15: qty 1

## 2016-03-15 MED ORDER — TECHNETIUM TO 99M ALBUMIN AGGREGATED
4.0000 | Freq: Once | INTRAVENOUS | Status: AC | PRN
  Administered 2016-03-15: 4 via INTRAVENOUS

## 2016-03-15 NOTE — ED Notes (Signed)
Per ems- pt was at [facility] for f/u from lumpectomy in June. Reports was sitting in waiting room, started feeling sob and having CP. Pt reported not sleeping well last night. Was feeling lighthheaded during the exam, and after lying down she felt lightheaded when standing. Sat in the lobby for awhile but didn't feel better and got tingling/numbness to tips of both fingers. Given 324 aspirin, 1 nitro, 7/10 left sided cp radiating down left arm. Is a x 4. BP 128/43, HR 106 ST, 20 R. hand

## 2016-03-15 NOTE — ED Notes (Signed)
Placed patient into a gown and on the monitor to do a ekg

## 2016-03-15 NOTE — Discharge Instructions (Signed)
°  Return to ER as needed with any recurrence of your symptoms. Routine primary care follow-up.  Hyperventilation Hyperventilation is breathing that is deeper and more rapid than normal. It is usually associated with panic and anxiety. Hyperventilation can make you feel breathless. It is sometimes called overbreathing. Breathing out too much causes a decrease in the amount of carbon dioxide gas in the blood. This leads to tingling and numbness in the hands, feet, and around the mouth. If this continues, your fingers, hands, and toes may begin to spasm. Hyperventilation usually lasts 20-30 minutes and can be associated with other symptoms of panic and anxiety, including:   Chest pains or tightness.  A pounding or irregular, racing heartbeat (palpitations).  Dizziness.  Lightheadedness.  Dry mouth.  Weakness.  Confusion.  Sleep disturbance. CAUSES  Sudden onset (acute) hyperventilation is usually triggered by acute stress, anxiety, or emotional upset. Long-term (chronic) and recurring hyperventilation can occur with chronic lung problems, such emphysema or asthma. Other causes include:   Nervousness.  Stress.  Stimulant, drug, or alcohol use.  Lung disease.  Infections, such as pneumonia.  Heart problems.  Severe pain.  Waking from a bad dream.  Pregnancy.  Bleeding. HOME CARE INSTRUCTIONS  Learn and use breathing exercises that help you breathe from your diaphragm and abdomen.  Practice relaxation techniques to reduce stress, such as visualization, meditation, and muscle release.  During an attack, try breathing into a paper bag. This changes the carbon dioxide level and slows down breathing. SEEK IMMEDIATE MEDICAL CARE IF:  Your hyperventilation continues or gets worse. MAKE SURE YOU:  Understand these instructions.  Will watch your condition.  Will get help right away if you are not doing well or get worse.   This information is not intended to replace  advice given to you by your health care provider. Make sure you discuss any questions you have with your health care provider.   Document Released: 08/17/2000 Document Revised: 02/19/2012 Document Reviewed: 11/29/2011 Elsevier Interactive Patient Education 2016 [place] of Breath Shortness of breath means you have trouble breathing. Shortness of breath needs medical care right away. HOME CARE   Do not smoke.  Avoid being around chemicals or things (paint fumes, dust) that may bother your breathing.  Rest as needed. Slowly begin your normal activities.  Only take medicines as told by your doctor.  Keep all doctor visits as told. GET HELP RIGHT AWAY IF:   Your shortness of breath gets worse.  You feel lightheaded, pass out (faint), or have a cough that is not helped by medicine.  You cough up blood.  You have pain with breathing.  You have pain in your chest, arms, shoulders, or belly (abdomen).  You have a fever.  You cannot walk up stairs or exercise the way you normally do.  You do not get better in the time expected.  You have a hard time doing normal activities even with rest.  You have problems with your medicines.  You have any new symptoms. MAKE SURE YOU:  Understand these instructions.  Will watch your condition.  Will get help right away if you are not doing well or get worse.   This information is not intended to replace advice given to you by your health care provider. Make sure you discuss any questions you have with your health care provider.   Document Released: 02/06/2008 Document Revised: 08/25/2013 Document Reviewed: 11/05/2011 Elsevier Interactive Patient Education [employer].

## 2016-03-15 NOTE — ED Notes (Signed)
Pt has returned from [place].

## 2016-03-15 NOTE — ED Provider Notes (Signed)
CSN: [id number]     Arrival date & time 03/15/16  1321 History   First MD Initiated Contact with Patient 03/15/16 1335     Chief Complaint  Patient presents with  . Chest Pain      HPI  Patient resists evaluation of lightheaded, or syncopal, left arm pain. Patient has a history of breast cancer diagnosed in December. Chemotherapy January, for Monday. Has Infuse-a-Port placed in May, removed in June. His surgery she had removal of her Port-A-Cath, lumpectomy, and bilateral breast reduction surgery. She was in for her follow-up visit today. Had been feeling well. She did not sleep well last night states she felt poorly today. She was lightheaded. She was in her exam room feeling hot and lightheaded and had to lay down. When she stood she can feel lightheaded weight in the lobby for a short time. Started feeling numbness in her fingers are somewhat she has some anxiety. Was given nitroglycerin and aspirin by paramedics.  Rarely has intermittent episodes of tingling in both arms describes numbness in her fingertips and left-sided chest pain.  Past Medical History  Diagnosis Date  . Cancer ([place])   . Migraine   . Asthma   . Breast cancer ([place])   . Arthritis   . Back disorder     degenerative disk disease  . Pap smear for cervical cancer screening 08/12/15  . Mammogram abnormal 08/24/15    first  . GERD (gastroesophageal reflux disease)    Past Surgical History  Procedure Laterality Date  . Cholecystectomy    . Cesarean section    . Portacath placement [name] 09/12/2015    Procedure: INSERTION PORT-A-CATH;  Surgeon: Excell Seltzer, MD;  Location: [facility];  Service: General;  Laterality: [name];  . Cardiac catheterization [name] 01/24/2016    Procedure: Left Heart Cath and Coronary Angiography;  Surgeon: Leonie Man, MD;  Location: [facility];  Service: Cardiovascular;  Laterality: [name];  . Breast lumpectomy with radioactive seed and sentinel lymph node biopsy Left 02/13/2016     Procedure: BREAST LUMPECTOMY WITH RADIOACTIVE SEED AND SENTINEL LYMPH NODE BIOPSY;  Surgeon: Excell Seltzer, MD;  Location: [facility];  Service: General;  Laterality: Left;  . Peripheral vascular catheterization Right 02/13/2016    Procedure: PORTA CATH REMOVAL;  Surgeon: Excell Seltzer, MD;  Location: [facility];  Service: General;  Laterality: Right;  . Breast reduction surgery Bilateral 02/21/2016    Procedure: MAMMARY REDUCTION  (BREAST)/Oncoplastic breast reconstruction;  Surgeon: Irene Limbo, MD;  Location: [facility];  Service: Plastics;  Laterality: Bilateral;   Family History  Problem Relation Age of Onset  . Lung cancer Mother 65    2 different types of lung cancer; metastasis to brain; smoker  . Other Mother     hx of hysterectomy for unspecified reason  . Bone cancer Maternal Uncle     dx. early 60s  . Breast cancer Maternal Grandmother     dx. early 64s, s/p mastectomy  . Cancer Paternal Grandfather     unspecified type of cancer, dx. late 19s  . Congestive Heart Failure Father     smoker  . Stroke Father   . Cirrhosis Maternal Grandfather   . Heart Problems Maternal Grandfather   . Lung cancer Other     (maternal great uncle; MGM's brother); had a coal stove  . Cancer Cousin     unspecified type; d. early age (paternal first cousin once-removed)  . Cancer Cousin  dx. as a kid; in remission today; (paternal 2nd cousin)  . Cancer Cousin     unspecified type; d. early 14s; (maternal 1st cousin)   Social History  Substance Use Topics  . Smoking status: Never Smoker   . Smokeless tobacco: Never Used  . Alcohol Use: 0.0 oz/week    0 Standard drinks or equivalent per week     Comment: occassional glass of wine   OB History    Gravida Para Term Preterm AB TAB SAB Ectopic Multiple Living   0 0 0 0 0 0 0 0       Review of Systems  Constitutional: Negative for fever, chills, diaphoresis, appetite change and  fatigue.  HENT: Negative for mouth sores, sore throat and trouble swallowing.   Eyes: Negative for visual disturbance.  Respiratory: Negative for cough, chest tightness, shortness of breath and wheezing.        Hyperventilation shortness of breath  Cardiovascular: Positive for chest pain.  Gastrointestinal: Negative for nausea, vomiting, abdominal pain, diarrhea and abdominal distention.  Endocrine: Negative for polydipsia, polyphagia and polyuria.  Genitourinary: Negative for dysuria, frequency and hematuria.  Musculoskeletal: Negative for gait problem.  Skin: Negative for color change, pallor and rash.  Neurological: Positive for dizziness. Negative for syncope, light-headedness and headaches.  Hematological: Does not bruise/bleed easily.  Psychiatric/Behavioral: Negative for behavioral problems and confusion.      Allergies  Tape; Dilaudid; Ivp dye; Percocet; and Toradol  Home Medications   Prior to Admission medications   Medication Sig Start Date End Date Taking? Authorizing Provider  albuterol (PROVENTIL HFA;VENTOLIN HFA) 108 (90 Base) MCG/ACT inhaler Inhale 2 puffs into the lungs every 4 (four) hours as needed for wheezing or shortness of breath.    Historical Provider, MD  Cyclobenzaprine HCl (FLEXERIL PO) 1 tablet. Reported on 03/12/2016    Historical Provider, MD  diphenhydrAMINE (BENADRYL) 25 mg capsule Take 1 capsule (25 mg total) by mouth every 6 (six) hours as needed for itching. 02/22/16   Irene Limbo, MD  ibuprofen (ADVIL,MOTRIN) 200 MG tablet Take 1 tablet by mouth. Reported on 03/12/2016    Historical Provider, MD  methocarbamol (ROBAXIN) 500 MG tablet Take 1 tablet (500 mg total) by mouth every 8 (eight) hours as needed for muscle spasms. 02/22/16   Irene Limbo, MD  morphine (MSIR) 15 MG tablet Take 15 mg by mouth as needed. Reported on 03/12/2016    Historical Provider, MD  oxyCODONE (OXY IR/ROXICODONE) 5 MG immediate release tablet Take 1-2 tablets (5-10 mg  total) by mouth every 4 (four) hours as needed for moderate pain or severe pain. Patient not taking: Reported on 03/12/2016 02/22/16   Irene Limbo, MD  triamcinolone cream (KENALOG) 0.1 % Apply 1 application topically 2 (two) times daily. Patient taking differently: Apply 1 application topically 2 (two) times daily as needed (For eczema.).  12/13/15   Laurie Panda, NP   BP 116/76 mmHg  Pulse 93  Temp(Src) 98.9 F (37.2 C) (Oral)  Resp 18  SpO2 98% Physical Exam  Constitutional: She is oriented to person, place, and time. She appears well-developed and well-nourished. No distress.  HENT:  Head: Normocephalic.  Eyes: Conjunctivae are normal. Pupils are equal, round, and reactive to light. No scleral icterus.  Neck: Normal range of motion. Neck supple. No thyromegaly present.  Cardiovascular: Normal rate and regular rhythm.  Exam reveals no gallop and no friction rub.   No murmur heard. Pulmonary/Chest: Effort normal and breath sounds normal. No respiratory distress.  She has no wheezes. She has no rales.  About all breath sounds. No wheezing rales rhonchi or prolongation. Incisions appear well-healed. She is not tachycardic or tachypneic. Saturations 98% on room air.  Abdominal: Soft. Bowel sounds are normal. She exhibits no distension. There is no tenderness. There is no rebound.  Musculoskeletal: Normal range of motion.  Neurological: She is alert and oriented to person, place, and time.  Skin: Skin is warm and dry. No rash noted.  Psychiatric: She has a normal mood and affect. Her behavior is normal.    ED Course  Procedures (including critical care time) Labs Review Labs Reviewed  CBC WITH DIFFERENTIAL/PLATELET - Abnormal; Notable for the following:    Hemoglobin 11.6 (*)    All other components within normal limits  I-STAT CHEM 8, ED - Abnormal; Notable for the following:    Calcium, Ion 1.33 (*)    All other components within normal limits  BASIC METABOLIC PANEL   BRAIN NATRIURETIC PEPTIDE  I-STAT TROPOININ, ED    Imaging Review Dg Chest 2 View  03/15/2016  CLINICAL DATA:  Left-sided chest pain for 1 day EXAM: CHEST  2 VIEW COMPARISON:  01/21/2016 FINDINGS: Cardiac shadow is at the upper limits of normal in size. The lungs are clear bilaterally. Postsurgical changes in the left axilla are noted. No acute bony abnormality is seen. Previously seen right-sided chest port has been removed in the interval. IMPRESSION: No acute abnormality noted. Electronically Signed   By: Inez Catalina M.D.   On: 03/15/2016 15:31   Nm Pulmonary Perf And Vent  03/15/2016  CLINICAL DATA:  45 year old female with acute shortness of breath and chest pain. EXAM: NUCLEAR MEDICINE VENTILATION - PERFUSION LUNG SCAN TECHNIQUE: Ventilation images were obtained in multiple projections using inhaled aerosol Tc-75m DTPA. Perfusion images were obtained in multiple projections after intravenous injection of Tc-65m MAA. RADIOPHARMACEUTICALS:  31.0 MCi Technetium-34m DTPA aerosol inhalation and 4.2 mCi Technetium-83m MAA IV COMPARISON:  None. FINDINGS: Ventilation: No focal ventilation defect. Perfusion: No wedge shaped peripheral perfusion defects to suggest acute pulmonary embolism. IMPRESSION: Normal scan.  No evidence of pulmonary emboli. Electronically Signed   By: Margarette Canada M.D.   On: 03/15/2016 15:33   I have personally reviewed and evaluated these images and lab results as part of my medical decision-making.   EKG Interpretation   Date/Time:  Thursday March 15 2016 13:27:58 EDT Ventricular Rate:  93 PR Interval:    QRS Duration: 82 QT Interval:  371 QTC Calculation: 462 R Axis:   -2 Text Interpretation:  Sinus rhythm Atrial premature complex Low voltage,  precordial leads Consider anterior infarct Confirmed by Jeneen Rinks  MD, [place]  [phone]) on 03/15/2016 1:53:13 PM      MDM   Final diagnoses:  SOB (shortness of breath)    Normal VQ scan. Normal chest x-ray. EKG without  change. She had normal cath in May of this year. Has normal. I think a lot of her symptoms are anxiety related. Was given pain medication and Ativan. I think she will be appropriate for discharge.    Tanna Furry, MD 03/15/16 [phone]

## 2016-03-16 ENCOUNTER — Ambulatory Visit
Admission: RE | Admit: 2016-03-16 | Discharge: 2016-03-16 | Disposition: A | Discharge status: Home / Self Care | Payer: Federal, State, Local not specified - PPO | Source: Ambulatory Visit | Attending: Radiation Oncology | Admitting: Radiation Oncology

## 2016-03-16 DIAGNOSIS — Z510 Encounter for antineoplastic radiation therapy: Secondary | ICD-10-CM | POA: Diagnosis not present

## 2016-03-16 DIAGNOSIS — C50412 Malignant neoplasm of upper-outer quadrant of left female breast: Secondary | ICD-10-CM

## 2016-03-16 NOTE — Progress Notes (Signed)
Patient education , radiation therapy and you book, my business card,

## 2016-03-19 ENCOUNTER — Other Ambulatory Visit: Payer: Self-pay | Admitting: *Deleted

## 2016-03-19 ENCOUNTER — Other Ambulatory Visit: Payer: Self-pay

## 2016-03-19 DIAGNOSIS — C50412 Malignant neoplasm of upper-outer quadrant of left female breast: Secondary | ICD-10-CM

## 2016-03-19 DIAGNOSIS — G609 Hereditary and idiopathic neuropathy, unspecified: Secondary | ICD-10-CM

## 2016-03-19 MED ORDER — GABAPENTIN 100 MG PO CAPS: 100.0000 mg | ORAL_CAPSULE | Freq: Three times a day (TID) | ORAL | Status: DC

## 2016-03-19 MED ORDER — GABAPENTIN 300 MG PO CAPS: 300.0000 mg | ORAL_CAPSULE | Freq: Three times a day (TID) | ORAL | Status: DC

## 2016-03-19 NOTE — Progress Notes (Signed)
Pt c/o numbness in fingertips - causing difficulty at work.  Per Dr Lindi Adie, neurontin titrate to 300.  Order placed.  Pt called and educated on how to take medication.  Pt voiced understanding.

## 2016-03-21 DIAGNOSIS — Z510 Encounter for antineoplastic radiation therapy: Secondary | ICD-10-CM | POA: Diagnosis not present

## 2016-03-23 ENCOUNTER — Ambulatory Visit
Admission: RE | Admit: 2016-03-23 | Discharge: 2016-03-23 | Disposition: A | Discharge status: Home / Self Care | Payer: Federal, State, Local not specified - PPO | Source: Ambulatory Visit | Attending: Radiation Oncology | Admitting: Radiation Oncology

## 2016-03-23 ENCOUNTER — Encounter: Payer: Self-pay | Admitting: Radiation Oncology

## 2016-03-23 ENCOUNTER — Encounter: Payer: Self-pay | Admitting: Oncology

## 2016-03-23 VITALS — BP 130/70 | HR 90 | Temp 98.5°F

## 2016-03-23 DIAGNOSIS — Z510 Encounter for antineoplastic radiation therapy: Secondary | ICD-10-CM | POA: Diagnosis not present

## 2016-03-23 DIAGNOSIS — C50012 Malignant neoplasm of nipple and areola, left female breast: Secondary | ICD-10-CM

## 2016-03-23 NOTE — Progress Notes (Signed)
Cindy Bradshaw has completed port films today to her left breast.  She reports having a dry rash on her left breast that appeared after CT SIM on 03/16/16.  She does have a dry peeling rash on her lower left breast and around her nipple area.  She denies having any itching or pain.  She is also requested a letter detailing side effects of radiation for her work.  BP 130/70 mmHg  Pulse 90  Temp(Src) 98.5 F (36.9 C) (Oral)  SpO2 99%

## 2016-03-24 NOTE — Progress Notes (Signed)
Radiation Oncology         [phone] ________________________________  Name: Cindy Bradshaw MRN: [id number]  Date: 03/23/2016  DOB: 1971/01/10  Follow-Up Visit Note  CC: Ileana Roup, MD  Nicholas Lose, MD  Diagnosis:  Stage IIA (ypT2, ypN0) invasive ductal carcinoma of the left breast   Narrative:  The patient returns today for routine follow-up in anticipation of radiation therapy. She has undergone simulation and will begin treatment on Monday. Since she completed chemotherapy, her eczema has been under good control until she had simulation. She describes a dry rash on her left breast and would like to have this evaluated today.                         On review of systems, the patient states that she has noticed itchiness of her left breast, with dry flaky changes. No redness is noted by the patient. She denies any pain or fevers. She denies chest pain, shortness of breath, nausea, vomiting, or other sites of skin changes. She denies using new products on her skin or new detergent to wash her clothes. A complete review of systems is obtained and is otherwise negative.  Past Medical History:  Past Medical History  Diagnosis Date  . Cancer ([place])   . Migraine   . Asthma   . Breast cancer ([place])   . Arthritis   . Back disorder     degenerative disk disease  . Pap smear for cervical cancer screening 08/12/15  . Mammogram abnormal 08/24/15    first  . GERD (gastroesophageal reflux disease)     Past Surgical History: Past Surgical History  Procedure Laterality Date  . Cholecystectomy    . Cesarean section    . Portacath placement [name] 09/12/2015    Procedure: INSERTION PORT-A-CATH;  Surgeon: Excell Seltzer, MD;  [facility];  Service: General;  Laterality: [name];  . Cardiac catheterization [name] 01/24/2016    Procedure: Left Heart Cath and Coronary Angiography;  Surgeon: Leonie Man, MD;  Location: [facility];  Service: Cardiovascular;  Laterality: [name];  .  Breast lumpectomy with radioactive seed and sentinel lymph node biopsy Left 02/13/2016    Procedure: BREAST LUMPECTOMY WITH RADIOACTIVE SEED AND SENTINEL LYMPH NODE BIOPSY;  Surgeon: Excell Seltzer, MD;  Location: [facility];  Service: General;  Laterality: Left;  . Peripheral vascular catheterization Right 02/13/2016    Procedure: PORTA CATH REMOVAL;  Surgeon: Excell Seltzer, MD;  Location: [facility];  Service: General;  Laterality: Right;  . Breast reduction surgery Bilateral 02/21/2016    Procedure: MAMMARY REDUCTION  (BREAST)/Oncoplastic breast reconstruction;  Surgeon: Irene Limbo, MD;  Location: [facility];  Service: Plastics;  Laterality: Bilateral;    Social History:  Social History   Social History  . Marital Status: Married    Spouse Name: Chrissie Noa  . Number of Children: 2  . Years of Education: [name]   Occupational History  . Web designer    Social History Main Topics  . Smoking status: Never Smoker   . Smokeless tobacco: Never Used  . Alcohol Use: 0.0 oz/week    0 Standard drinks or equivalent per week     Comment: occassional glass of wine  . Drug Use: No  . Sexual Activity: Yes   Other Topics Concern  . Not on file   Social History Narrative   ** Merged History Encounter **  First MP age 75.  LMP 08/26/15.     2 children carried to term.  Age at first live birth 27   H/o oral contrasception use       Family History: Family History  Problem Relation Age of Onset  . Lung cancer Mother 69    2 different types of lung cancer; metastasis to brain; smoker  . Other Mother     hx of hysterectomy for unspecified reason  . Bone cancer Maternal Uncle     dx. early 15s  . Breast cancer Maternal Grandmother     dx. early 74s, s/p mastectomy  . Cancer Paternal Grandfather     unspecified type of cancer, dx. late 72s  . Congestive Heart Failure Father     smoker  . Stroke Father   . Cirrhosis  Maternal Grandfather   . Heart Problems Maternal Grandfather   . Lung cancer Other     (maternal great uncle; MGM's brother); had a coal stove  . Cancer Cousin     unspecified type; d. early age (paternal first cousin once-removed)  . Cancer Cousin     dx. as a kid; in remission today; (paternal 2nd cousin)  . Cancer Cousin     unspecified type; d. early 17s; (maternal 1st cousin)    ALLERGIES:  is allergic to tape; dilaudid; ivp dye; percocet; and toradol.  Meds: Current Outpatient Prescriptions  Medication Sig Dispense Refill  . albuterol (PROVENTIL HFA;VENTOLIN HFA) 108 (90 Base) MCG/ACT inhaler Inhale 2 puffs into the lungs every 4 (four) hours as needed for wheezing or shortness of breath.    . Cyclobenzaprine HCl (FLEXERIL PO) 1 tablet. Reported on 03/12/2016    . diphenhydrAMINE (BENADRYL) 25 mg capsule Take 1 capsule (25 mg total) by mouth every 6 (six) hours as needed for itching. 30 capsule 0  . gabapentin (NEURONTIN) 100 MG capsule Take 1 capsule (100 mg total) by mouth 3 (three) times daily. For 7 days.  Then take 2 capsule (200 mg) by mouth 3 times daily for 7 days. 63 capsule 0  . gabapentin (NEURONTIN) 300 MG capsule Take 1 capsule (300 mg total) by mouth 3 (three) times daily. 90 capsule 2  . ibuprofen (ADVIL,MOTRIN) 200 MG tablet Take 1 tablet by mouth. Reported on 03/12/2016    . methocarbamol (ROBAXIN) 500 MG tablet Take 1 tablet (500 mg total) by mouth every 8 (eight) hours as needed for muscle spasms. 30 tablet 0  . oxyCODONE (OXY IR/ROXICODONE) 5 MG immediate release tablet Take 1-2 tablets (5-10 mg total) by mouth every 4 (four) hours as needed for moderate pain or severe pain. 50 tablet 0  . triamcinolone cream (KENALOG) 0.1 % Apply 1 application topically 2 (two) times daily. (Patient taking differently: Apply 1 application topically 2 (two) times daily as needed (For eczema.). ) 160 g 1  . morphine (MSIR) 15 MG tablet Take 15 mg by mouth as needed. Reported on  03/23/2016     No current facility-administered medications for this encounter.   Facility-Administered Medications Ordered in Other Encounters  Medication Dose Route Frequency Provider Last Rate Last Dose  . 0.9 %  sodium chloride infusion   Intravenous Once Laurie Panda, NP        Physical Findings:  oral temperature is 98.5 F (36.9 C). Her blood pressure is 130/70 and her pulse is 90. Her oxygen saturation is 99%. .   In general this is a well appearing African American woman in no acute  distress. She's alert and oriented x4 and appropriate throughout the examination. Cardiopulmonary assessment is negative for acute distress and she exhibits normal effort. The left breast is evaluated. Post surgical healing is noted with two small eschars on her left breast at the level of the areola. No erythema is noted. The skin is dry and flaky. No specific lesions are noted.   Lab Findings: Lab Results  Component Value Date   WBC 6.1 03/15/2016   HGB 12.2 03/15/2016   HCT 36.0 03/15/2016   MCV 91.9 03/15/2016   PLT 352 03/15/2016     Radiographic Findings: Dg Chest 2 View  03/15/2016  CLINICAL DATA:  Left-sided chest pain for 1 day EXAM: CHEST  2 VIEW COMPARISON:  01/21/2016 FINDINGS: Cardiac shadow is at the upper limits of normal in size. The lungs are clear bilaterally. Postsurgical changes in the left axilla are noted. No acute bony abnormality is seen. Previously seen right-sided chest port has been removed in the interval. IMPRESSION: No acute abnormality noted. Electronically Signed   By: Inez Catalina M.D.   On: 03/15/2016 15:31   Nm Pulmonary Perf And Vent  03/15/2016  CLINICAL DATA:  45 year old female with acute shortness of breath and chest pain. EXAM: NUCLEAR MEDICINE VENTILATION - PERFUSION LUNG SCAN TECHNIQUE: Ventilation images were obtained in multiple projections using inhaled aerosol Tc-7m DTPA. Perfusion images were obtained in multiple projections after intravenous  injection of Tc-34m MAA. RADIOPHARMACEUTICALS:  31.0 MCi Technetium-24m DTPA aerosol inhalation and 4.2 mCi Technetium-41m MAA IV COMPARISON:  None. FINDINGS: Ventilation: No focal ventilation defect. Perfusion: No wedge shaped peripheral perfusion defects to suggest acute pulmonary embolism. IMPRESSION: Normal scan.  No evidence of pulmonary emboli. Electronically Signed   By: Margarette Canada M.D.   On: 03/15/2016 15:33    Impression/Plan: 1. Stage IIA (ypT2, ypN0) invasive ductal carcinoma of the left breast. The patient will proceed with radiation as outlined by Dr. Lisbeth Renshaw on Monday next week. We will see her for close follow up and education next week. 2. Dry skin. The patient does not have changes concerning for eczema but through the weekend I have advised her to heavily moisturize her skin with aquaphor and to make sure that this is completely off of her skin by treatment on Monday. We will then use radiaplex during treatment. She states agreement and understanding.    Carola Rhine, PAC

## 2016-03-26 ENCOUNTER — Ambulatory Visit
Admission: RE | Admit: 2016-03-26 | Discharge: 2016-03-26 | Disposition: A | Discharge status: Home / Self Care | Payer: Federal, State, Local not specified - PPO | Source: Ambulatory Visit | Attending: Radiation Oncology | Admitting: Radiation Oncology

## 2016-03-26 DIAGNOSIS — Z510 Encounter for antineoplastic radiation therapy: Secondary | ICD-10-CM | POA: Diagnosis not present

## 2016-03-27 ENCOUNTER — Ambulatory Visit
Admission: RE | Admit: 2016-03-27 | Discharge: 2016-03-27 | Disposition: A | Discharge status: Home / Self Care | Payer: Federal, State, Local not specified - PPO | Source: Ambulatory Visit | Attending: Radiation Oncology | Admitting: Radiation Oncology

## 2016-03-27 ENCOUNTER — Ambulatory Visit: Payer: Federal, State, Local not specified - PPO | Attending: Radiation Oncology

## 2016-03-27 DIAGNOSIS — Z510 Encounter for antineoplastic radiation therapy: Secondary | ICD-10-CM | POA: Diagnosis not present

## 2016-03-28 ENCOUNTER — Ambulatory Visit
Admission: RE | Admit: 2016-03-28 | Discharge: 2016-03-28 | Disposition: A | Discharge status: Home / Self Care | Payer: Federal, State, Local not specified - PPO | Source: Ambulatory Visit | Attending: Radiation Oncology | Admitting: Radiation Oncology

## 2016-03-28 DIAGNOSIS — Z510 Encounter for antineoplastic radiation therapy: Secondary | ICD-10-CM | POA: Diagnosis not present

## 2016-03-28 DIAGNOSIS — C50012 Malignant neoplasm of nipple and areola, left female breast: Secondary | ICD-10-CM

## 2016-03-28 MED ORDER — ALRA NON-METALLIC DEODORANT (RAD-ONC)
1.0000 "application " | Freq: Once | TOPICAL | Status: AC
  Administered 2016-03-28: 1 via TOPICAL

## 2016-03-28 MED ORDER — RADIAPLEXRX EX GEL
Freq: Once | CUTANEOUS | Status: AC
Start: 2016-03-28 — End: 2016-03-28
  Administered 2016-03-28: 16:00:00 via TOPICAL

## 2016-03-28 NOTE — Progress Notes (Signed)
Patient education done, radiation therapy and you book, my business card, alra deodorant, radiaplex gel given to patient, discussed ways to manage side effects:skin irritation, soreness/swelling of breast, fatigue,pain,  Apply radiaplex and deodorant after rad tx and bedtime, use of electric razor if needed, increase protein in diet, stay hydrated, ,verbal understanding, teach back given 4:00 PM

## 2016-03-29 ENCOUNTER — Ambulatory Visit
Admission: RE | Admit: 2016-03-29 | Discharge: 2016-03-29 | Disposition: A | Discharge status: Home / Self Care | Payer: Federal, State, Local not specified - PPO | Source: Ambulatory Visit | Attending: Radiation Oncology | Admitting: Radiation Oncology

## 2016-03-29 DIAGNOSIS — Z510 Encounter for antineoplastic radiation therapy: Secondary | ICD-10-CM | POA: Diagnosis not present

## 2016-03-30 ENCOUNTER — Ambulatory Visit
Admission: RE | Admit: 2016-03-30 | Discharge: 2016-03-30 | Disposition: A | Discharge status: Home / Self Care | Payer: Federal, State, Local not specified - PPO | Source: Ambulatory Visit | Attending: Radiation Oncology | Admitting: Radiation Oncology

## 2016-03-30 ENCOUNTER — Encounter: Payer: Self-pay | Admitting: Radiation Oncology

## 2016-03-30 VITALS — BP 117/82 | HR 88 | Temp 98.7°F | Ht 65.5 in | Wt 244.9 lb

## 2016-03-30 DIAGNOSIS — Z510 Encounter for antineoplastic radiation therapy: Secondary | ICD-10-CM | POA: Diagnosis not present

## 2016-03-30 DIAGNOSIS — C50412 Malignant neoplasm of upper-outer quadrant of left female breast: Secondary | ICD-10-CM

## 2016-03-30 NOTE — Progress Notes (Signed)
Cindy Bradshaw has completed 5 fractions to her left breast.  She denies having pain.  She does feel fatigued.  She is requesting a refill on kenalog cream for eczema.  She is using radiaplex bid on her left breast.  The skin on her left breast has hyperpigmentation.  She has 2 small scabbed areas above and below the nipple area.    BP 117/82 (BP Location: Right Arm, Patient Position: Sitting)   Pulse 88   Temp 98.7 F (37.1 C) (Oral)   Ht 5' 5.5" (1.664 m)   Wt 244 lb 14.4 oz (111.1 kg)   SpO2 95%   BMI 40.13 kg/m    Wt Readings from Last 3 Encounters:  03/30/16 244 lb 14.4 oz (111.1 kg)  03/12/16 244 lb 12.8 oz (111 kg)  02/21/16 252 lb (114.3 kg)

## 2016-03-30 NOTE — Progress Notes (Signed)
Department of Radiation Oncology  Phone:  [phone] Fax:        [phone]  Weekly Treatment Note    Name: Cindy Bradshaw Date: 03/30/2016 MRN: [id number] DOB: [birth year]   Diagnosis:  Stage IIA (ypT2, ypN0) invasive ductal carcinoma of the left breast   Current dose: 9 Gy  Current fraction: 5    MEDICATIONS: Current Outpatient Prescriptions  Medication Sig Dispense Refill  . diphenhydrAMINE (BENADRYL) 25 mg capsule Take 1 capsule (25 mg total) by mouth every 6 (six) hours as needed for itching. 30 capsule 0  . gabapentin (NEURONTIN) 100 MG capsule Take 1 capsule (100 mg total) by mouth 3 (three) times daily. For 7 days.  Then take 2 capsule (200 mg) by mouth 3 times daily for 7 days. 63 capsule 0  . gabapentin (NEURONTIN) 300 MG capsule Take 1 capsule (300 mg total) by mouth 3 (three) times daily. 90 capsule 2  . hyaluronate sodium (RADIAPLEXRX) GEL Apply 1 application topically 2 (two) times daily.    . methocarbamol (ROBAXIN) 500 MG tablet Take 1 tablet (500 mg total) by mouth every 8 (eight) hours as needed for muscle spasms. 30 tablet 0  . non-metallic deodorant (ALRA) MISC Apply 1 application topically daily as needed.    Marland Kitchen oxyCODONE (OXY IR/ROXICODONE) 5 MG immediate release tablet Take 1-2 tablets (5-10 mg total) by mouth every 4 (four) hours as needed for moderate pain or severe pain. 50 tablet 0  . triamcinolone cream (KENALOG) 0.1 % Apply 1 application topically 2 (two) times daily. (Patient taking differently: Apply 1 application topically 2 (two) times daily as needed (For eczema.). ) 160 g 1  . albuterol (PROVENTIL HFA;VENTOLIN HFA) 108 (90 Base) MCG/ACT inhaler Inhale 2 puffs into the lungs every 4 (four) hours as needed for wheezing or shortness of breath.    . Cyclobenzaprine HCl (FLEXERIL PO) 1 tablet. Reported on 03/12/2016    . ibuprofen (ADVIL,MOTRIN) 200 MG tablet Take 1 tablet by mouth. Reported on 03/12/2016     No current facility-administered  medications for this encounter.    Facility-Administered Medications Ordered in Other Encounters  Medication Dose Route Frequency Provider Last Rate Last Dose  . 0.9 %  sodium chloride infusion   Intravenous Once Laurie Panda, NP         ALLERGIES: Tape; Dilaudid [hydromorphone hcl]; Ivp dye [iodinated diagnostic agents]; Percocet [oxycodone-acetaminophen]; and Toradol [ketorolac tromethamine]   LABORATORY DATA:  Lab Results  Component Value Date   WBC 6.1 03/15/2016   HGB 12.2 03/15/2016   HCT 36.0 03/15/2016   MCV 91.9 03/15/2016   PLT 352 03/15/2016   Lab Results  Component Value Date   NA 142 03/15/2016   K 3.6 03/15/2016   CL 105 03/15/2016   CO2 25 03/15/2016   Lab Results  Component Value Date   ALT 57 (H) 01/10/2016   AST 35 (H) 01/10/2016   ALKPHOS 56 01/10/2016   BILITOT 0.48 01/10/2016     NARRATIVE: Cindy Bradshaw was seen today for weekly treatment management. The chart was checked and the patient's films were reviewed. Cindy Bradshaw has completed 5 fractions to her left breast.  She denies having pain.  She does feel fatigued.  She is requesting a refill on kenalog cream for eczema.  She is using radiaplex bid on her left breast.  The skin on her left breast has hyperpigmentation.  She has 2 small scabbed areas above and below the nipple area.  PHYSICAL EXAMINATION: height is 5' 5.5" (1.664 m) and weight is 244 lb 14.4 oz (111.1 kg). Her oral temperature is 98.7 F (37.1 C). Her blood pressure is 117/82 and her pulse is 88. Her oxygen saturation is 95%.    The patient has no radiation reaction has of yet.  She does have reaction from her sentinel lymph node dye injection sites which is resolving.    ASSESSMENT: The patient is doing satisfactorily with treatment.  PLAN: We will continue with the patient's radiation treatment as planned.        This document serves as a record of services personally performed by Tyler Pita, MD. It was  created on his behalf by Truddie Hidden, a trained medical scribe. The creation of this record is based on the scribe's personal observations and the provider's statements to them. This document has been checked and approved by the attending provider.

## 2016-04-02 ENCOUNTER — Telehealth: Payer: Self-pay | Admitting: *Deleted

## 2016-04-02 ENCOUNTER — Ambulatory Visit
Admission: RE | Admit: 2016-04-02 | Discharge: 2016-04-02 | Disposition: A | Discharge status: Home / Self Care | Payer: Federal, State, Local not specified - PPO | Source: Ambulatory Visit | Attending: Radiation Oncology | Admitting: Radiation Oncology

## 2016-04-02 DIAGNOSIS — Z510 Encounter for antineoplastic radiation therapy: Secondary | ICD-10-CM | POA: Diagnosis not present

## 2016-04-02 NOTE — Telephone Encounter (Signed)
  Oncology Nurse Navigator Documentation  Navigator Location: [facility] (04/02/16 1400) Navigator Encounter Type: Telephone (04/02/16 1400) Telephone: [place] Call (04/02/16 1400)         Patient Visit Type: [id number] (04/02/16 1400) Treatment Phase: First Radiation Tx (04/02/16 1400)                            Time Spent with Patient: 15 (04/02/16 1400)

## 2016-04-03 ENCOUNTER — Ambulatory Visit
Admission: RE | Admit: 2016-04-03 | Discharge: 2016-04-03 | Disposition: A | Discharge status: Home / Self Care | Payer: Federal, State, Local not specified - PPO | Source: Ambulatory Visit | Attending: Radiation Oncology | Admitting: Radiation Oncology

## 2016-04-03 DIAGNOSIS — Z510 Encounter for antineoplastic radiation therapy: Secondary | ICD-10-CM | POA: Diagnosis not present

## 2016-04-04 ENCOUNTER — Ambulatory Visit
Admission: RE | Admit: 2016-04-04 | Discharge: 2016-04-04 | Disposition: A | Discharge status: Home / Self Care | Payer: Federal, State, Local not specified - PPO | Source: Ambulatory Visit | Attending: Radiation Oncology | Admitting: Radiation Oncology

## 2016-04-04 DIAGNOSIS — Z510 Encounter for antineoplastic radiation therapy: Secondary | ICD-10-CM | POA: Diagnosis not present

## 2016-04-05 ENCOUNTER — Ambulatory Visit
Admission: RE | Admit: 2016-04-05 | Discharge: 2016-04-05 | Disposition: A | Discharge status: Home / Self Care | Payer: Federal, State, Local not specified - PPO | Source: Ambulatory Visit | Attending: Radiation Oncology | Admitting: Radiation Oncology

## 2016-04-05 DIAGNOSIS — Z510 Encounter for antineoplastic radiation therapy: Secondary | ICD-10-CM | POA: Diagnosis not present

## 2016-04-06 ENCOUNTER — Ambulatory Visit
Admission: RE | Admit: 2016-04-06 | Discharge: 2016-04-06 | Disposition: A | Discharge status: Home / Self Care | Payer: Federal, State, Local not specified - PPO | Source: Ambulatory Visit | Attending: Radiation Oncology | Admitting: Radiation Oncology

## 2016-04-06 ENCOUNTER — Encounter: Payer: Self-pay | Admitting: Radiation Oncology

## 2016-04-06 VITALS — BP 127/73 | HR 85 | Temp 98.8°F | Ht 65.5 in | Wt 246.5 lb

## 2016-04-06 DIAGNOSIS — Z510 Encounter for antineoplastic radiation therapy: Secondary | ICD-10-CM | POA: Diagnosis not present

## 2016-04-06 DIAGNOSIS — C50412 Malignant neoplasm of upper-outer quadrant of left female breast: Secondary | ICD-10-CM

## 2016-04-06 NOTE — Addendum Note (Signed)
Encounter addended by: Kyung Rudd, MD on: 04/06/2016  2:20 PM<BR>    Actions taken: Visit diagnoses modified, Sign clinical note

## 2016-04-06 NOTE — Progress Notes (Signed)
  Radiation Oncology         [phone] ________________________________  Name: Cindy Bradshaw MRN: [id number]  Date: 03/16/2016  DOB: 03-22-1971  Optical Surface Tracking Plan:  Since intensity modulated radiotherapy (IMRT) and 3D conformal radiation treatment methods are predicated on accurate and precise positioning for treatment, intrafraction motion monitoring is medically necessary to ensure accurate and safe treatment delivery.  The ability to quantify intrafraction motion without excessive ionizing radiation dose can only be performed with optical surface tracking. Accordingly, surface imaging offers the opportunity to obtain 3D measurements of patient position throughout IMRT and 3D treatments without excessive radiation exposure.  I am ordering optical surface tracking for this patient's upcoming course of radiotherapy. ________________________________  Kyung Rudd, MD 04/06/2016 2:20 PM    Reference:   Particia Jasper, et al. Surface imaging-based analysis of intrafraction motion for breast radiotherapy patients.Journal of [place], n. 6, nov. 2014. ISSN [id number].   Available at: <http://www.jacmp.org/index.php/jacmp/article/view/4957>.

## 2016-04-06 NOTE — Progress Notes (Signed)
Department of Radiation Oncology  Phone:  [phone] Fax:        [phone]  Weekly Treatment Note    Name: Cindy Bradshaw Date: 04/06/2016 MRN: [id number] DOB: [birth year]   Diagnosis:     ICD-9-CM ICD-10-CM   1. Breast cancer of upper-outer quadrant of left female breast ([place]) 174.4 C50.412      Current dose: 18 Gy  Current fraction:10   MEDICATIONS: Current Outpatient Prescriptions  Medication Sig Dispense Refill  . albuterol (PROVENTIL HFA;VENTOLIN HFA) 108 (90 Base) MCG/ACT inhaler Inhale 2 puffs into the lungs every 4 (four) hours as needed for wheezing or shortness of breath.    . diphenhydrAMINE (BENADRYL) 25 mg capsule Take 1 capsule (25 mg total) by mouth every 6 (six) hours as needed for itching. 30 capsule 0  . gabapentin (NEURONTIN) 300 MG capsule Take 1 capsule (300 mg total) by mouth 3 (three) times daily. 90 capsule 2  . hyaluronate sodium (RADIAPLEXRX) GEL Apply 1 application topically 2 (two) times daily.    . methocarbamol (ROBAXIN) 500 MG tablet Take 1 tablet (500 mg total) by mouth every 8 (eight) hours as needed for muscle spasms. 30 tablet 0  . non-metallic deodorant (ALRA) MISC Apply 1 application topically daily as needed.    Marland Kitchen oxyCODONE (OXY IR/ROXICODONE) 5 MG immediate release tablet Take 1-2 tablets (5-10 mg total) by mouth every 4 (four) hours as needed for moderate pain or severe pain. 50 tablet 0  . triamcinolone cream (KENALOG) 0.1 % Apply 1 application topically 2 (two) times daily. (Patient taking differently: Apply 1 application topically 2 (two) times daily as needed (For eczema.). ) 160 g 1  . gabapentin (NEURONTIN) 100 MG capsule Take 1 capsule (100 mg total) by mouth 3 (three) times daily. For 7 days.  Then take 2 capsule (200 mg) by mouth 3 times daily for 7 days. (Patient not taking: Reported on 04/06/2016) 63 capsule 0  . ibuprofen (ADVIL,MOTRIN) 200 MG tablet Take 1 tablet by mouth. Reported on 03/12/2016     No current  facility-administered medications for this encounter.    Facility-Administered Medications Ordered in Other Encounters  Medication Dose Route Frequency Provider Last Rate Last Dose  . 0.9 %  sodium chloride infusion   Intravenous Once Laurie Panda, NP         ALLERGIES: Tape; Dilaudid [hydromorphone hcl]; Ivp dye [iodinated diagnostic agents]; Percocet [oxycodone-acetaminophen]; and Toradol [ketorolac tromethamine]   LABORATORY DATA:  Lab Results  Component Value Date   WBC 6.1 03/15/2016   HGB 12.2 03/15/2016   HCT 36.0 03/15/2016   MCV 91.9 03/15/2016   PLT 352 03/15/2016   Lab Results  Component Value Date   NA 142 03/15/2016   K 3.6 03/15/2016   CL 105 03/15/2016   CO2 25 03/15/2016   Lab Results  Component Value Date   ALT 57 (H) 01/10/2016   AST 35 (H) 01/10/2016   ALKPHOS 56 01/10/2016   BILITOT 0.48 01/10/2016     NARRATIVE: Cindy Bradshaw was seen today for weekly treatment management. The chart was checked and the patient's films were reviewed.  Cindy Bradshaw is here for her 10th fraction of radiation to her Left Breast. She denies pain. She does however report sharp pains in her Left Breast which come and go after a while. She reports fatigue. She is working full time. Her Left Breast is Hyperpigmented as well as her Left Axilla area. She is using the Radiaplex cream twice daily  as directed.   BP 127/73   Pulse 85   Temp 98.8 F (37.1 C)   Ht 5' 5.5" (1.664 m)   Wt 246 lb 8 oz (111.8 kg)   BMI 40.40 kg/m    Wt Readings from Last 3 Encounters:  04/06/16 246 lb 8 oz (111.8 kg)  03/30/16 244 lb 14.4 oz (111.1 kg)  03/12/16 244 lb 12.8 oz (111 kg)    PHYSICAL EXAMINATION: height is 5' 5.5" (1.664 m) and weight is 246 lb 8 oz (111.8 kg). Her temperature is 98.8 F (37.1 C). Her blood pressure is 127/73 and her pulse is 85.        ASSESSMENT: The patient is doing satisfactorily with treatment.  PLAN: We will continue with the patient's radiation  treatment as planned.

## 2016-04-06 NOTE — Progress Notes (Signed)
  Radiation Oncology         [phone] ________________________________  Name: Cindy Bradshaw. [place] MRN: [id number]  Date: 03/16/2016  DOB: [birth year]  DIAGNOSIS:     ICD-9-CM ICD-10-CM   1. Breast cancer of upper-outer quadrant of left female breast ([place]) 174.4 C50.412      SIMULATION AND TREATMENT PLANNING NOTE  The patient presented for simulation prior to beginning her course of radiation treatment for her diagnosis of left-sided breast cancer. The patient was placed in a supine position on a breast board. A customized vac-lock bag was constructed and this complex treatment device will be used on a daily basis during her treatment. In this fashion, a CT scan was obtained through the chest area and an isocenter was placed near the chest wall within the breast.  The patient will be planned to receive a course of radiation initially to a dose of 50.4 Gy. This will consist of a whole breast radiotherapy technique. To accomplish this, 2 customized blocks have been designed which will correspond to medial and lateral whole breast tangent fields. This treatment will be accomplished at 1.8 Gy per fraction. A forward planning technique will also be evaluated to determine if this approach improves the plan. It is anticipated that the patient will then receive a 10 Gy boost to the seroma cavity which has been contoured. This will be accomplished at 2 Gy per fraction.   This initial treatment will consist of a 3-D conformal technique. The seroma has been contoured as the primary target structure. Additionally, dose volume histograms of both this target as well as the lungs and heart will also be evaluated. Such an approach is necessary to ensure that the target area is adequately covered while the nearby critical  normal structures are adequately spared.  Plan:  The final anticipated total dose therefore will correspond to 60.4 Gy.   Special treatment procedure was performed today due to the extra time  and effort required by myself to plan and prepare this patient for deep inspiration breath hold technique.  I have determined cardiac sparing to be of benefit to this patient to prevent long term cardiac damage due to radiation of the heart.  Bellows were placed on the patient's abdomen. To facilitate cardiac sparing, the patient was coached by the radiation therapists on breath hold techniques and breathing practice was performed. Practice waveforms were obtained. The patient was then scanned while maintaining breath hold in the treatment position.  This image was then transferred over to the imaging specialist. The imaging specialist then created a fusion of the free breathing and breath hold scans using the chest wall as the stable structure. I personally reviewed the fusion in axial, coronal and sagittal image planes.  Excellent cardiac sparing was obtained.  I felt the patient is an appropriate candidate for breath hold and the patient will be treated as such.  The image fusion was then reviewed with the patient to reinforce the necessity of reproducible breath hold.   _______________________________   Jodelle Gross, MD, PhD

## 2016-04-06 NOTE — Progress Notes (Signed)
Cindy Bradshaw is here for her 10th fraction of radiation to her Left Breast. She denies pain. She does however report sharp pains in her Left Breast which come and go after a while. She reports fatigue. She is working full time. Her Left Breast is Hyperpigmented as well as her Left Axilla area. She is using the Radiaplex cream twice daily as directed.   BP 127/73   Pulse 85   Temp 98.8 F (37.1 C)   Ht 5' 5.5" (1.664 m)   Wt 246 lb 8 oz (111.8 kg)   BMI 40.40 kg/m    Wt Readings from Last 3 Encounters:  04/06/16 246 lb 8 oz (111.8 kg)  03/30/16 244 lb 14.4 oz (111.1 kg)  03/12/16 244 lb 12.8 oz (111 kg)

## 2016-04-09 ENCOUNTER — Other Ambulatory Visit: Payer: Self-pay

## 2016-04-09 ENCOUNTER — Telehealth: Payer: Self-pay

## 2016-04-09 ENCOUNTER — Ambulatory Visit
Admission: RE | Admit: 2016-04-09 | Discharge: 2016-04-09 | Disposition: A | Discharge status: Home / Self Care | Payer: Federal, State, Local not specified - PPO | Source: Ambulatory Visit | Attending: Radiation Oncology | Admitting: Radiation Oncology

## 2016-04-09 DIAGNOSIS — Z510 Encounter for antineoplastic radiation therapy: Secondary | ICD-10-CM | POA: Diagnosis not present

## 2016-04-09 DIAGNOSIS — C50412 Malignant neoplasm of upper-outer quadrant of left female breast: Secondary | ICD-10-CM

## 2016-04-09 DIAGNOSIS — G609 Hereditary and idiopathic neuropathy, unspecified: Secondary | ICD-10-CM

## 2016-04-09 MED ORDER — GABAPENTIN 300 MG PO CAPS: 900.0000 mg | ORAL_CAPSULE | Freq: Three times a day (TID) | ORAL | 2 refills | Status: DC

## 2016-04-09 NOTE — Telephone Encounter (Signed)
Received VM from pt stating "she was done with the medication she was taking for her numb fingers but her fingers were still numb."  Pt also states she had a fever and aches yesterday.  Called pt back to discuss details further.  Pt states she took her last dose of neurontin yesterday but was still experiencing numbness to her fingers.  Upon review, pt taking 200mg  TID.  Pt also reports tmax 101 yesterday and reports feeling achy.  Pt states she took tylenol and has not recorded her temperature since.  Pt denies any other s/s of infection including urinary or respiratory symptoms.  Reviewed all information with Dr. Lindi Adie who recommends pt to continue on neurontin and to increase dose to 300mg  TID.  Pt to continue to monitor temperatures at home and to contact us with fever 100.4 or greater or onset of new symptoms.  Went to radiation oncology to visit pt and discuss further.  Pt verbalized understanding to increase neurontin and call us with any new temps or s/s of infection.  Pt without further questions or concerns while discussing in radiation department.

## 2016-04-10 ENCOUNTER — Other Ambulatory Visit: Payer: Self-pay

## 2016-04-10 ENCOUNTER — Ambulatory Visit
Admission: RE | Admit: 2016-04-10 | Discharge: 2016-04-10 | Disposition: A | Discharge status: Home / Self Care | Payer: Federal, State, Local not specified - PPO | Source: Ambulatory Visit | Attending: Radiation Oncology | Admitting: Radiation Oncology

## 2016-04-10 ENCOUNTER — Encounter: Payer: Self-pay | Admitting: Radiation Oncology

## 2016-04-10 VITALS — BP 123/74 | HR 97 | Temp 98.7°F | Resp 20 | Wt 250.1 lb

## 2016-04-10 DIAGNOSIS — C50412 Malignant neoplasm of upper-outer quadrant of left female breast: Secondary | ICD-10-CM

## 2016-04-10 DIAGNOSIS — Z510 Encounter for antineoplastic radiation therapy: Secondary | ICD-10-CM | POA: Diagnosis not present

## 2016-04-10 LAB — BASIC METABOLIC PANEL
Anion Gap: 7 mEq/L (ref 3–11)
BUN: 10 mg/dL (ref 7.0–26.0)
CALCIUM: 10.1 mg/dL (ref 8.4–10.4)
CO2: 27 mEq/L (ref 22–29)
CREATININE: 0.8 mg/dL (ref 0.6–1.1)
Chloride: 107 mEq/L (ref 98–109)
Glucose: 121 mg/dl (ref 70–140)
Potassium: 3.4 mEq/L — ABNORMAL LOW (ref 3.5–5.1)
SODIUM: 140 meq/L (ref 136–145)

## 2016-04-10 LAB — URINALYSIS, MICROSCOPIC - CHCC
Bilirubin (Urine): NEGATIVE
GLUCOSE UR CHCC: NEGATIVE mg/dL
Ketones: NEGATIVE mg/dL
LEUKOCYTE ESTERASE: NEGATIVE
Nitrite: NEGATIVE
PH: 6 (ref 4.6–8.0)
PROTEIN: NEGATIVE mg/dL
RBC / HPF: NEGATIVE (ref 0–2)
SPECIFIC GRAVITY, URINE: 1.01 (ref 1.003–1.035)
UROBILINOGEN UR: 0.2 mg/dL (ref 0.2–1)
WBC, UA: NEGATIVE (ref 0–2)

## 2016-04-10 LAB — CBC WITH DIFFERENTIAL/PLATELET
BASO%: 0.3 % (ref 0.0–2.0)
BASOS ABS: 0 10*3/uL (ref 0.0–0.1)
EOS ABS: 0.2 10*3/uL (ref 0.0–0.5)
EOS%: 4.1 % (ref 0.0–7.0)
HCT: 36.4 % (ref 34.8–46.6)
HGB: 11.6 g/dL (ref 11.6–15.9)
LYMPH%: 21.7 % (ref 14.0–49.7)
MCH: 28.5 pg (ref 25.1–34.0)
MCHC: 32 g/dL (ref 31.5–36.0)
MCV: 89.2 fL (ref 79.5–101.0)
MONO#: 0.5 10*3/uL (ref 0.1–0.9)
MONO%: 12 % (ref 0.0–14.0)
NEUT%: 61.9 % (ref 38.4–76.8)
NEUTROS ABS: 2.4 10*3/uL (ref 1.5–6.5)
Platelets: 350 10*3/uL (ref 145–400)
RBC: 4.08 10*6/uL (ref 3.70–5.45)
RDW: 14.3 % (ref 11.2–14.5)
WBC: 3.9 10*3/uL (ref 3.9–10.3)
lymph#: 0.9 10*3/uL (ref 0.9–3.3)

## 2016-04-10 MED ORDER — TRIAMCINOLONE ACETONIDE 0.1 % EX CREA: 1.0000 "application " | TOPICAL_CREAM | Freq: Two times a day (BID) | CUTANEOUS | 1 refills | Status: DC | PRN

## 2016-04-10 NOTE — Progress Notes (Signed)
  Radiation Oncology         [phone] ________________________________  Name: Cindy Bradshaw. [place] MRN: [id number]  Date: 04/10/2016  DOB: 11/25/1970  Weekly Radiation Therapy Management    ICD-9-CM ICD-10-CM   1. Breast cancer of upper-outer quadrant of left female breast ([facility]) 174.4 C50.412   2. Malignant neoplasm of upper-outer quadrant of left female breast ([facility]) 174.4 C50.412 CBC with Differential     Basic metabolic panel     Urine culture     Urinalysis, Microscopic - [facility]     Current Dose: 21.6 Gy     Planned Dose:  50.4+ Gy  Narrative . . . . . . . . The patient presents for an UNSCHEDULED under treatment assessment.                      Weekly rd txs to left breast 12 completed. She was getting off linac table after treatment today and she felt light headed and she states she almost fell. She was sent to nursing for assessment. The patient states she has been having a fever and is taking tylenol today at home. She reports a fever of 100.9 and a fever yesterday of 101.0. She reports a fever when she wakes up in the morning. She reports hematuria a few days ago. She reports generalized muscle aches and numbness in her hands. She reports sharp pain to the left breast after radiation treatments.                                 Set-up films were reviewed.                                 The chart was checked. Physical Findings. . .  weight is 250 lb 1.6 oz (113.4 kg). Her oral temperature is 98.7 F (37.1 C). Her blood pressure is 123/74 and her pulse is 97. Her respiration is 20.  Left breast shows some hyperpigmentation changes with no sign of infection. Lungs are clear to auscultation bilaterally. Heart has regular rate and rhythm. No pharyngitis. In a wheelchair. Impression . . . . . . . The patient is tolerating radiation. Patient having chills and fever. Plan . . . . . . . . . . . . Continue treatment as planned. We will have her sent to the lab for CBC with diff and BMP. Urinalysis  and culture and light of her symptoms. ________________________________   Blair Promise, PhD, MD  This document serves as a record of services personally performed by Gery Pray, MD. It was created on his behalf by Darcus Austin, a trained medical scribe. The creation of this record is based on the scribe's personal observations and the provider's statements to them. This document has been checked and approved by the attending provider.

## 2016-04-10 NOTE — Progress Notes (Addendum)
Weekly rd txs to left breast 12 completed, was getting off linac table treatment  Area and felt light headed, almost fell stated patient, sent to nursing for assessment, patient stated she has been having a fever taking tylenol today at home 100.90, took tylenol at 830 am, yesterdays fever 101.0 happens when waking up in the morning,s achy ness generalized ;, numbness in hands, hyperpigmentation on breast and axilla, no pain,  Took orthostatic vitals, BP 123/74 (BP Location: Right Arm, Patient Position: Standing, Cuff Size: Large)   Pulse 97   Temp 98.7 F (37.1 C) (Oral)   Resp 20   Wt 250 lb 1.6 oz (113.4 kg)   BMI 40.99 kg/m   Wt Readings from Last 3 Encounters:  04/10/16 250 lb 1.6 oz (113.4 kg)  04/06/16 246 lb 8 oz (111.8 kg)  03/30/16 244 lb 14.4 oz (111.1 kg)   BP (!) 129/59 (BP Location: Right Leg, Patient Position: Sitting, Cuff Size: Large)   Pulse 90   Temp 98.7 F (37.1 C) (Oral)   Wt 250 lb 1.6 oz (113.4 kg)   BMI 40.99 kg/m

## 2016-04-11 ENCOUNTER — Ambulatory Visit
Admission: RE | Admit: 2016-04-11 | Discharge: 2016-04-11 | Disposition: A | Discharge status: Home / Self Care | Payer: Federal, State, Local not specified - PPO | Source: Ambulatory Visit | Attending: Radiation Oncology | Admitting: Radiation Oncology

## 2016-04-11 DIAGNOSIS — Z510 Encounter for antineoplastic radiation therapy: Secondary | ICD-10-CM | POA: Diagnosis not present

## 2016-04-11 LAB — URINE CULTURE

## 2016-04-12 ENCOUNTER — Emergency Department (HOSPITAL_COMMUNITY)
Admission: EM | Admit: 2016-04-12 | Discharge: 2016-04-12 | Disposition: A | Discharge status: Home / Self Care | Payer: Federal, State, Local not specified - PPO | Attending: Emergency Medicine | Admitting: Emergency Medicine

## 2016-04-12 ENCOUNTER — Encounter (HOSPITAL_COMMUNITY): Payer: Self-pay | Admitting: *Deleted

## 2016-04-12 ENCOUNTER — Emergency Department (HOSPITAL_COMMUNITY): Payer: Federal, State, Local not specified - PPO

## 2016-04-12 ENCOUNTER — Ambulatory Visit
Admission: RE | Admit: 2016-04-12 | Discharge: 2016-04-12 | Disposition: A | Discharge status: Home / Self Care | Payer: Federal, State, Local not specified - PPO | Source: Ambulatory Visit | Attending: Radiation Oncology | Admitting: Radiation Oncology

## 2016-04-12 DIAGNOSIS — J45909 Unspecified asthma, uncomplicated: Secondary | ICD-10-CM | POA: Insufficient documentation

## 2016-04-12 DIAGNOSIS — Z510 Encounter for antineoplastic radiation therapy: Secondary | ICD-10-CM | POA: Diagnosis not present

## 2016-04-12 DIAGNOSIS — R509 Fever, unspecified: Secondary | ICD-10-CM | POA: Diagnosis not present

## 2016-04-12 DIAGNOSIS — R112 Nausea with vomiting, unspecified: Secondary | ICD-10-CM | POA: Diagnosis present

## 2016-04-12 DIAGNOSIS — R519 Headache, unspecified: Secondary | ICD-10-CM

## 2016-04-12 DIAGNOSIS — R197 Diarrhea, unspecified: Secondary | ICD-10-CM | POA: Insufficient documentation

## 2016-04-12 DIAGNOSIS — Z79899 Other long term (current) drug therapy: Secondary | ICD-10-CM | POA: Diagnosis not present

## 2016-04-12 DIAGNOSIS — R51 Headache: Secondary | ICD-10-CM

## 2016-04-12 DIAGNOSIS — R000 Tachycardia, unspecified: Secondary | ICD-10-CM | POA: Insufficient documentation

## 2016-04-12 DIAGNOSIS — Z853 Personal history of malignant neoplasm of breast: Secondary | ICD-10-CM | POA: Insufficient documentation

## 2016-04-12 LAB — COMPREHENSIVE METABOLIC PANEL
ALBUMIN: 3.8 g/dL (ref 3.5–5.0)
ALK PHOS: 60 U/L (ref 38–126)
ALT: 56 U/L — AB (ref 14–54)
AST: 47 U/L — ABNORMAL HIGH (ref 15–41)
Anion gap: 5 (ref 5–15)
BUN: 12 mg/dL (ref 6–20)
CHLORIDE: 108 mmol/L (ref 101–111)
CO2: 26 mmol/L (ref 22–32)
CREATININE: 0.79 mg/dL (ref 0.44–1.00)
Calcium: 9.8 mg/dL (ref 8.9–10.3)
GFR calc non Af Amer: >60 mL/min (ref >60)
GLUCOSE: 100 mg/dL — AB (ref 65–99)
Potassium: 3.6 mmol/L (ref 3.5–5.1)
SODIUM: 139 mmol/L (ref 135–145)
Total Bilirubin: 0.5 mg/dL (ref 0.3–1.2)
Total Protein: 7.1 g/dL (ref 6.5–8.1)

## 2016-04-12 LAB — URINALYSIS, ROUTINE W REFLEX MICROSCOPIC
BILIRUBIN URINE: NEGATIVE
GLUCOSE, UA: NEGATIVE mg/dL
Ketones, ur: NEGATIVE mg/dL
Leukocytes, UA: NEGATIVE
Nitrite: NEGATIVE
Protein, ur: NEGATIVE mg/dL
SPECIFIC GRAVITY, URINE: 1.013 (ref 1.005–1.030)
pH: 7 (ref 5.0–8.0)

## 2016-04-12 LAB — URINE MICROSCOPIC-ADD ON

## 2016-04-12 LAB — LIPASE, BLOOD: Lipase: 20 U/L (ref 11–51)

## 2016-04-12 LAB — CBC
HCT: 37.1 % (ref 36.0–46.0)
HEMOGLOBIN: 11.8 g/dL — AB (ref 12.0–15.0)
MCH: 28.9 pg (ref 26.0–34.0)
MCHC: 31.8 g/dL (ref 30.0–36.0)
MCV: 90.7 fL (ref 78.0–100.0)
PLATELETS: 367 10*3/uL (ref 150–400)
RBC: 4.09 MIL/uL (ref 3.87–5.11)
RDW: 13.9 % (ref 11.5–15.5)
WBC: 3.7 10*3/uL — ABNORMAL LOW (ref 4.0–10.5)

## 2016-04-12 LAB — I-STAT BETA HCG BLOOD, ED (MC, WL, AP ONLY): I-stat hCG, quantitative: <5 m[IU]/mL (ref <5)

## 2016-04-12 MED ORDER — MORPHINE SULFATE (PF) 4 MG/ML IV SOLN
4.0000 mg | Freq: Once | INTRAVENOUS | Status: AC
  Administered 2016-04-12: 4 mg via INTRAVENOUS
  Filled 2016-04-12: qty 1

## 2016-04-12 MED ORDER — METOCLOPRAMIDE HCL 5 MG/ML IJ SOLN
10.0000 mg | Freq: Once | INTRAMUSCULAR | Status: AC
  Administered 2016-04-12: 10 mg via INTRAVENOUS
  Filled 2016-04-12: qty 2

## 2016-04-12 MED ORDER — HALOPERIDOL LACTATE 5 MG/ML IJ SOLN
2.0000 mg | Freq: Once | INTRAMUSCULAR | Status: AC
  Administered 2016-04-12: 2 mg via INTRAVENOUS
  Filled 2016-04-12: qty 1

## 2016-04-12 MED ORDER — DEXAMETHASONE SODIUM PHOSPHATE 10 MG/ML IJ SOLN
10.0000 mg | Freq: Once | INTRAMUSCULAR | Status: AC
  Administered 2016-04-12: 10 mg via INTRAVENOUS
  Filled 2016-04-12: qty 1

## 2016-04-12 MED ORDER — MAGNESIUM SULFATE 2 GM/50ML IV SOLN
2.0000 g | Freq: Once | INTRAVENOUS | Status: AC
  Administered 2016-04-12: 2 g via INTRAVENOUS
  Filled 2016-04-12: qty 50

## 2016-04-12 MED ORDER — SODIUM CHLORIDE 0.9 % IV BOLUS (SEPSIS)
1000.0000 mL | Freq: Once | INTRAVENOUS | Status: AC
  Administered 2016-04-12: 1000 mL via INTRAVENOUS

## 2016-04-12 MED ORDER — ONDANSETRON 4 MG PO TBDP: 4.0000 mg | ORAL_TABLET | Freq: Three times a day (TID) | ORAL | 0 refills | Status: DC | PRN

## 2016-04-12 MED ORDER — DIPHENHYDRAMINE HCL 50 MG/ML IJ SOLN
12.5000 mg | Freq: Once | INTRAMUSCULAR | Status: AC
  Administered 2016-04-12: 12.5 mg via INTRAVENOUS
  Filled 2016-04-12: qty 1

## 2016-04-12 NOTE — ED Notes (Signed)
Pt ambulated to restroom without assistance or difficulty.

## 2016-04-12 NOTE — ED Triage Notes (Signed)
Pt states that she began having a headache 2 days ago; pt c/o body aches, fever and neck pain; pt states that she began having N/V/D today; pt reports 3 episodes of vomiting and 5 episodes of diarrhea; pt states that the pain is too severe to but her chin to her chest

## 2016-04-12 NOTE — ED Provider Notes (Signed)
[facility] DEPT Provider Note   CSN: [id number] Arrival date & time: 04/12/16  0009  By signing my name below, I, Gwenlyn Fudge, attest that this documentation has been prepared under the direction and in the presence of Charlann Lange, PA-C. Electronically Signed: Gwenlyn Fudge, ED Scribe. 04/12/16. 1:19 AM.  First MD Initiated Contact with Patient 04/12/16 0107    History   Chief Complaint Chief Complaint  Patient presents with  . Migraine  . Emesis   The history is provided by the patient. No language interpreter was used.    HPI Comments: Cindy Bradshaw is a 45 y.o. female with PMHx of Migraine who presents to the Emergency Department complaining of a gradual onset, constant, migraine onset 2 days. Pt reports associated numbness in both hands, chills, fever, neck stiffness, neck pain, nausea, cough, episodic vomiting and diarrhea. Pt reports 3 episodes of vomiting and 5 episodes of diarrhea. Pt states onset of nausea, vomiting and diarrhea was earlier today. Pt denies blood in diarrhea or vomit. Pt reports fever of 100.9. Pt denies recent tick bites. Pt denies sick contact. Pt denies congestion or appetite change.  Past Medical History:  Diagnosis Date  . Arthritis   . Asthma   . Back disorder    degenerative disk disease  . Breast cancer ([place])   . Cancer ([place])   . GERD (gastroesophageal reflux disease)   . Mammogram abnormal 08/24/15   first  . Migraine   . Pap smear for cervical cancer screening 08/12/15    Patient Active Problem List   Diagnosis Date Noted  . Breast cancer, left breast ([place]) 02/21/2016  . Chest pain   . Diaphoresis   . Lightheadedness   . Pain in the chest   . SOB (shortness of breath)   . Abnormal nuclear stress test   . Unstable angina ([place]) 01/21/2016  . Right ankle pain 01/21/2016  . Nausea with vomiting 01/04/2016  . Adnexal cyst: Right per CT 12/09/2015 12/12/2015  . Asthma 12/09/2015  . Abdominal pain 12/09/2015  . Chemotherapy-induced  peripheral neuropathy ([place]) 12/05/2015  . Genetic testing 10/13/2015  . Dehydration 09/25/2015  . Family history of breast cancer in female 09/20/2015  . Normocytic normochromic anemia 09/19/2015  . Breast cancer of upper-outer quadrant of left female breast ([place]) 09/07/2015    Past Surgical History:  Procedure Laterality Date  . BREAST LUMPECTOMY WITH RADIOACTIVE SEED AND SENTINEL LYMPH NODE BIOPSY Left 02/13/2016   Procedure: BREAST LUMPECTOMY WITH RADIOACTIVE SEED AND SENTINEL LYMPH NODE BIOPSY;  Surgeon: Excell Seltzer, MD;  Location: [facility];  Service: General;  Laterality: Left;  . BREAST REDUCTION SURGERY Bilateral 02/21/2016   Procedure: MAMMARY REDUCTION  (BREAST)/Oncoplastic breast reconstruction;  Surgeon: Irene Limbo, MD;  Location: [facility];  Service: Plastics;  Laterality: Bilateral;  . CARDIAC CATHETERIZATION [name] 01/24/2016   Procedure: Left Heart Cath and Coronary Angiography;  Surgeon: Leonie Man, MD;  Location: [facility];  Service: Cardiovascular;  Laterality: [name];  . CESAREAN SECTION    . CHOLECYSTECTOMY    . PERIPHERAL VASCULAR CATHETERIZATION Right 02/13/2016   Procedure: PORTA CATH REMOVAL;  Surgeon: Excell Seltzer, MD;  Location: [facility];  Service: General;  Laterality: Right;  . PORTACATH PLACEMENT [name] 09/12/2015   Procedure: INSERTION PORT-A-CATH;  Surgeon: Excell Seltzer, MD;  Location: [facility];  Service: General;  Laterality: [name];    OB History    Gravida Para Term Preterm AB Living   0 0  0 0 0     SAB TAB Ectopic Multiple Live Births   0 0 0           Home Medications    Prior to Admission medications   Medication Sig Start Date End Date Taking? Authorizing Provider  albuterol (PROVENTIL HFA;VENTOLIN HFA) 108 (90 Base) MCG/ACT inhaler Inhale 2 puffs into the lungs every 4 (four) hours as needed for wheezing or shortness of breath.    Historical Provider, MD  diphenhydrAMINE  (BENADRYL) 25 mg capsule Take 1 capsule (25 mg total) by mouth every 6 (six) hours as needed for itching. 02/22/16   Irene Limbo, MD  escitalopram (LEXAPRO) 20 MG tablet Take 20 mg by mouth at bedtime.    Historical Provider, MD  gabapentin (NEURONTIN) 300 MG capsule Take 3 capsules (900 mg total) by mouth 3 (three) times daily. 04/09/16   Nicholas Lose, MD  hyaluronate sodium (RADIAPLEXRX) GEL Apply 1 application topically 2 (two) times daily.    Historical Provider, MD  ibuprofen (ADVIL,MOTRIN) 200 MG tablet Take 1 tablet by mouth. Reported on 03/12/2016    Historical Provider, MD  methocarbamol (ROBAXIN) 500 MG tablet Take 1 tablet (500 mg total) by mouth every 8 (eight) hours as needed for muscle spasms. 02/22/16   Irene Limbo, MD  non-metallic deodorant Jethro Poling) MISC Apply 1 application topically daily as needed.    Historical Provider, MD  oxyCODONE (OXY IR/ROXICODONE) 5 MG immediate release tablet Take 1-2 tablets (5-10 mg total) by mouth every 4 (four) hours as needed for moderate pain or severe pain. 02/22/16   Irene Limbo, MD  triamcinolone cream (KENALOG) 0.1 % Apply 1 application topically 2 (two) times daily as needed (For eczema.). 04/10/16   Nicholas Lose, MD    Family History Family History  Problem Relation Age of Onset  . Lung cancer Mother 75    2 different types of lung cancer; metastasis to brain; smoker  . Other Mother     hx of hysterectomy for unspecified reason  . Bone cancer Maternal Uncle     dx. early 72s  . Breast cancer Maternal Grandmother     dx. early 42s, s/p mastectomy  . Cancer Paternal Grandfather     unspecified type of cancer, dx. late 9s  . Congestive Heart Failure Father     smoker  . Stroke Father   . Cirrhosis Maternal Grandfather   . Heart Problems Maternal Grandfather   . Lung cancer Other     (maternal great uncle; MGM's brother); had a coal stove  . Cancer Cousin     unspecified type; d. early age (paternal first cousin once-removed)    . Cancer Cousin     dx. as a kid; in remission today; (paternal 2nd cousin)  . Cancer Cousin     unspecified type; d. early 8s; (maternal 1st cousin)    Social History Social History  Substance Use Topics  . Smoking status: Never Smoker  . Smokeless tobacco: Never Used  . Alcohol use 0.0 oz/week     Comment: occassional glass of wine     Allergies   Tape; Dilaudid [hydromorphone hcl]; Ivp dye [iodinated diagnostic agents]; Percocet [oxycodone-acetaminophen]; and Toradol [ketorolac tromethamine]   Review of Systems Review of Systems  Constitutional: Positive for chills and fever. Negative for appetite change.  HENT: Negative for congestion.   Eyes: Positive for photophobia.  Respiratory: Positive for cough.   Gastrointestinal: Positive for diarrhea, nausea and vomiting.  Musculoskeletal: Positive for neck pain and  neck stiffness.       Generalized body aches  Neurological: Positive for numbness and headaches.  All other systems reviewed and are negative.  Physical Exam Updated Vital Signs BP 124/73   Pulse 91   Temp 98.6 F (37 C) (Oral)   Resp 18   Ht 5\' 4"  (1.626 m)   Wt 249 lb (112.9 kg)   SpO2 98%   BMI 42.74 kg/m   Physical Exam  Constitutional: She appears well-developed and well-nourished.  HENT:  Head: Normocephalic.  Eyes: Conjunctivae are normal.  Neck: Normal range of motion.  Cardiovascular: Regular rhythm.  Tachycardia present.   Pulmonary/Chest: Effort normal and breath sounds normal. No respiratory distress. She has no wheezes. She has no rales.  Abdominal: She exhibits no distension. There is no tenderness.  Musculoskeletal: Normal range of motion.  Neurological: She is alert.  normal strength and coordination that is symmetric  normal cranial nerves 3-12 no deficit no facial asymmetry No nuchal rigidity  Skin: Skin is warm and dry. No rash noted.  Psychiatric: She has a normal mood and affect. Her behavior is normal.  Nursing note and  vitals reviewed.  ED Treatments / Results  DIAGNOSTIC STUDIES: Oxygen Saturation is 98% on RA, normal by my interpretation.    COORDINATION OF CARE: 1:12 AM Discussed treatment plan with pt at bedside which includes lab work and pt agreed to plan.  Labs (all labs ordered are listed, but only abnormal results are displayed) Labs Reviewed  LIPASE, BLOOD  COMPREHENSIVE METABOLIC PANEL  CBC  URINALYSIS, ROUTINE W REFLEX MICROSCOPIC (NOT AT [facility])  I-STAT BETA HCG BLOOD, ED ([facility], [facility], [facility] ONLY)   Results for orders placed or performed during the hospital encounter of 04/12/16  Lipase, blood  Result Value Ref Range   Lipase 20 11 - 51 U/L  Comprehensive metabolic panel  Result Value Ref Range   Sodium 139 135 - 145 mmol/L   Potassium 3.6 3.5 - 5.1 mmol/L   Chloride 108 101 - 111 mmol/L   CO2 26 22 - 32 mmol/L   Glucose, Bld 100 (H) 65 - 99 mg/dL   BUN 12 6 - 20 mg/dL   Creatinine, Ser 0.79 0.44 - 1.00 mg/dL   Calcium 9.8 8.9 - 10.3 mg/dL   Total Protein 7.1 6.5 - 8.1 g/dL   Albumin 3.8 3.5 - 5.0 g/dL   AST 47 (H) 15 - 41 U/L   ALT 56 (H) 14 - 54 U/L   Alkaline Phosphatase 60 38 - 126 U/L   Total Bilirubin 0.5 0.3 - 1.2 mg/dL   GFR calc non Af Amer >60 >60 mL/min   GFR calc Af Amer >60 >60 mL/min   Anion gap 5 5 - 15  CBC  Result Value Ref Range   WBC 3.7 (L) 4.0 - 10.5 K/uL   RBC 4.09 3.87 - 5.11 MIL/uL   Hemoglobin 11.8 (L) 12.0 - 15.0 g/dL   HCT 37.1 36.0 - 46.0 %   MCV 90.7 78.0 - 100.0 fL   MCH 28.9 26.0 - 34.0 pg   MCHC 31.8 30.0 - 36.0 g/dL   RDW 13.9 11.5 - 15.5 %   Platelets 367 150 - 400 K/uL  Urinalysis, Routine w reflex microscopic  Result Value Ref Range   Color, Urine YELLOW YELLOW   APPearance CLEAR CLEAR   Specific Gravity, Urine 1.013 1.005 - 1.030   pH 7.0 5.0 - 8.0   Glucose, UA NEGATIVE NEGATIVE mg/dL   Hgb urine  dipstick TRACE (A) NEGATIVE   Bilirubin Urine NEGATIVE NEGATIVE   Ketones, ur NEGATIVE NEGATIVE mg/dL   Protein, ur NEGATIVE NEGATIVE  mg/dL   Nitrite NEGATIVE NEGATIVE   Leukocytes, UA NEGATIVE NEGATIVE  Urine microscopic-add on  Result Value Ref Range   Squamous Epithelial / LPF 0-5 (A) NONE SEEN   WBC, UA 0-5 0 - 5 WBC/hpf   RBC / HPF 0-5 0 - 5 RBC/hpf   Bacteria, UA RARE (A) NONE SEEN  I-Stat beta hCG blood, ED  Result Value Ref Range   I-stat hCG, quantitative <5.0 <5 mIU/mL   Comment 3            EKG  EKG Interpretation None       Radiology No results found.  Procedures Procedures (including critical care time)  Medications Ordered in ED Medications - No data to display   Initial Impression / Assessment and Plan / ED Course  I have reviewed the triage vital signs and the nursing notes.  Pertinent labs & imaging results that were available during my care of the patient were reviewed by me and considered in my medical decision making (see chart for details).  Clinical Course    Patient presents with fever, myalgias, N, V, D. She reports fever at home, none in the ED. She is well appearing without meningeal signs. No history of tick bite known. IVF's and headache medications provided. On re-evaluation, she is feeling much better, no further headache, photophobia. Labs reassuring. She can be discharged home with PCP follow up.  Final Clinical Impressions(s) / ED Diagnoses   Final diagnoses:  None   1. Headache 2. Febrile illness 3. N, V, D  New Prescriptions New Prescriptions   No medications on file   I personally performed the services described in this documentation, which was scribed in my presence. The recorded information has been reviewed and is accurate.      Charlann Lange, PA-C 04/12/16 0559    Gareth Morgan, MD 04/12/16 [phone]

## 2016-04-12 NOTE — ED Notes (Signed)
PA at bedside.

## 2016-04-13 ENCOUNTER — Ambulatory Visit
Admission: RE | Admit: 2016-04-13 | Discharge: 2016-04-13 | Disposition: A | Discharge status: Home / Self Care | Payer: Federal, State, Local not specified - PPO | Source: Ambulatory Visit | Attending: Radiation Oncology | Admitting: Radiation Oncology

## 2016-04-13 ENCOUNTER — Other Ambulatory Visit: Payer: Self-pay | Admitting: *Deleted

## 2016-04-13 VITALS — BP 121/82 | HR 85 | Temp 98.5°F | Resp 16 | Wt 250.3 lb

## 2016-04-13 DIAGNOSIS — C50412 Malignant neoplasm of upper-outer quadrant of left female breast: Secondary | ICD-10-CM | POA: Diagnosis not present

## 2016-04-13 DIAGNOSIS — Z510 Encounter for antineoplastic radiation therapy: Secondary | ICD-10-CM | POA: Diagnosis not present

## 2016-04-13 MED ORDER — RADIAPLEXRX EX GEL
Freq: Once | CUTANEOUS | Status: AC
  Administered 2016-04-13: 12:00:00 via TOPICAL

## 2016-04-13 NOTE — Progress Notes (Signed)
PAIN: She is currently in no pain. SKIN: Pt left breast- positive for Dryness, Hyperpigmentation and breast tenderness.  Pt denies edema. Reports numbness in bilateral finger tips, started approximately 3 weeks ago.  Pt continues to apply Radiaplex as directed. OTHER: Pt complains of fatigue, weakness and loss of sleep-she reports her scheduled is off. BP 121/82   Pulse 85   Temp 98.5 F (36.9 C) (Oral)   Resp 16   Wt 250 lb 4.8 oz (113.5 kg)   SpO2 99%   BMI 42.96 kg/m  Wt Readings from Last 3 Encounters:  04/13/16 250 lb 4.8 oz (113.5 kg)  04/12/16 249 lb (112.9 kg)  04/10/16 250 lb 1.6 oz (113.4 kg)

## 2016-04-13 NOTE — Progress Notes (Signed)
Department of Radiation Oncology  Phone:  [phone] Fax:        [phone]  Weekly Treatment Note    Name: Cindy Bradshaw Date: 04/15/2016 MRN: [id number] DOB: 02-05-1971   Diagnosis:     ICD-9-CM ICD-10-CM   1. Malignant neoplasm of upper-outer quadrant of left female breast (HCC) 174.4 C50.412 hyaluronate sodium (RADIAPLEXRX) gel  2. Breast cancer of upper-outer quadrant of left female breast ([place]) 174.4 C50.412      Current dose: 27 Gy  Current fraction:15   MEDICATIONS: Current Outpatient Prescriptions  Medication Sig Dispense Refill  . albuterol (PROVENTIL HFA;VENTOLIN HFA) 108 (90 Base) MCG/ACT inhaler Inhale 2 puffs into the lungs every 4 (four) hours as needed for wheezing or shortness of breath.    . diphenhydrAMINE (BENADRYL) 25 mg capsule Take 1 capsule (25 mg total) by mouth every 6 (six) hours as needed for itching. (Patient not taking: Reported on 04/12/2016) 30 capsule 0  . escitalopram (LEXAPRO) 20 MG tablet Take 20 mg by mouth at bedtime.    . gabapentin (NEURONTIN) 300 MG capsule Take 3 capsules (900 mg total) by mouth 3 (three) times daily. 180 capsule 2  . hyaluronate sodium (RADIAPLEXRX) GEL Apply 1 application topically 2 (two) times daily.    Marland Kitchen ibuprofen (ADVIL,MOTRIN) 200 MG tablet Take 1 tablet by mouth every 8 (eight) hours as needed for mild pain. Reported on 03/12/2016    . methocarbamol (ROBAXIN) 500 MG tablet Take 1 tablet (500 mg total) by mouth every 8 (eight) hours as needed for muscle spasms. 30 tablet 0  . non-metallic deodorant (ALRA) MISC Apply 1 application topically daily as needed.    . ondansetron (ZOFRAN ODT) 4 MG disintegrating tablet Take 1 tablet (4 mg total) by mouth every 8 (eight) hours as needed for nausea or vomiting. 20 tablet 0  . oxyCODONE (OXY IR/ROXICODONE) 5 MG immediate release tablet Take 1-2 tablets (5-10 mg total) by mouth every 4 (four) hours as needed for moderate pain or severe pain. 50 tablet 0  . triamcinolone  cream (KENALOG) 0.1 % Apply 1 application topically 2 (two) times daily as needed (For eczema.). 160 g 1   No current facility-administered medications for this encounter.    Facility-Administered Medications Ordered in Other Encounters  Medication Dose Route Frequency Provider Last Rate Last Dose  . 0.9 %  sodium chloride infusion   Intravenous Once Laurie Panda, NP         ALLERGIES: Tape; Dilaudid [hydromorphone hcl]; Ivp dye [iodinated diagnostic agents]; Percocet [oxycodone-acetaminophen]; and Toradol [ketorolac tromethamine]   LABORATORY DATA:  Lab Results  Component Value Date   WBC 3.7 (L) 04/12/2016   HGB 11.8 (L) 04/12/2016   HCT 37.1 04/12/2016   MCV 90.7 04/12/2016   PLT 367 04/12/2016   Lab Results  Component Value Date   NA 139 04/12/2016   K 3.6 04/12/2016   CL 108 04/12/2016   CO2 26 04/12/2016   Lab Results  Component Value Date   ALT 56 (H) 04/12/2016   AST 47 (H) 04/12/2016   ALKPHOS 60 04/12/2016   BILITOT 0.5 04/12/2016     NARRATIVE: Cindy Bradshaw was seen today for weekly treatment management. The chart was checked and the patient's films were reviewed.  She is currently in no pain. Reports numbness in bilateral finger tips, started approximately 3 weeks ago. Reports dryness, hyperpigmentation, and breast tenderness. Using Radiaplex as directed. Reports fatigue, weakness, and loss of sleep - stating her scheduled is  off.   BP 121/82   Pulse 85   Temp 98.5 F (36.9 C) (Oral)   Resp 16   Wt 250 lb 4.8 oz (113.5 kg)   SpO2 99%   BMI 42.96 kg/m    Wt Readings from Last 3 Encounters:  04/13/16 250 lb 4.8 oz (113.5 kg)  04/12/16 249 lb (112.9 kg)  04/10/16 250 lb 1.6 oz (113.4 kg)    PHYSICAL EXAMINATION: weight is 250 lb 4.8 oz (113.5 kg). Her oral temperature is 98.5 F (36.9 C). Her blood pressure is 121/82 and her pulse is 85. Her respiration is 16 and oxygen saturation is 99%.        Hyperpigmentation in the treatment  area.  ASSESSMENT: The patient is doing satisfactorily with treatment.  PLAN: We will continue with the patient's radiation treatment as planned.     This document serves as a record of services personally performed by Kyung Rudd, MD. It was created on his behalf by Arlyce Harman, a trained medical scribe. The creation of this record is based on the scribe's personal observations and the provider's statements to them. This document has been checked and approved by the attending provider.  ------------------------------------------------  Jodelle Gross, MD, PhD

## 2016-04-13 NOTE — Progress Notes (Signed)
The patient came around today for an under treatment visit. She has noticed some persistent and possibly progressive symptoms of neuropathy in her fingertips bilaterally. She is taking Gabapentin, and I also suggested vitamin b 6, 100 mg po daily. She will try this as well. She has had recent migraine headache as well and I suggested that she contact her PCP who may want her to be evaluated by neurology for both issues.     Carola Rhine, PAC

## 2016-04-16 ENCOUNTER — Ambulatory Visit
Admission: RE | Admit: 2016-04-16 | Discharge: 2016-04-16 | Disposition: A | Discharge status: Home / Self Care | Payer: Federal, State, Local not specified - PPO | Source: Ambulatory Visit | Attending: Radiation Oncology | Admitting: Radiation Oncology

## 2016-04-16 DIAGNOSIS — Z510 Encounter for antineoplastic radiation therapy: Secondary | ICD-10-CM | POA: Diagnosis not present

## 2016-04-17 ENCOUNTER — Ambulatory Visit
Admission: RE | Admit: 2016-04-17 | Discharge: 2016-04-17 | Disposition: A | Discharge status: Home / Self Care | Payer: Federal, State, Local not specified - PPO | Source: Ambulatory Visit | Attending: Radiation Oncology | Admitting: Radiation Oncology

## 2016-04-17 DIAGNOSIS — Z510 Encounter for antineoplastic radiation therapy: Secondary | ICD-10-CM | POA: Diagnosis not present

## 2016-04-18 ENCOUNTER — Ambulatory Visit
Admission: RE | Admit: 2016-04-18 | Discharge: 2016-04-18 | Disposition: A | Discharge status: Home / Self Care | Payer: Federal, State, Local not specified - PPO | Source: Ambulatory Visit | Attending: Radiation Oncology | Admitting: Radiation Oncology

## 2016-04-18 ENCOUNTER — Encounter: Payer: Self-pay | Admitting: Radiation Oncology

## 2016-04-18 DIAGNOSIS — Z510 Encounter for antineoplastic radiation therapy: Secondary | ICD-10-CM | POA: Diagnosis not present

## 2016-04-19 ENCOUNTER — Ambulatory Visit
Admission: RE | Admit: 2016-04-19 | Discharge: 2016-04-19 | Disposition: A | Discharge status: Home / Self Care | Payer: Federal, State, Local not specified - PPO | Source: Ambulatory Visit | Attending: Radiation Oncology | Admitting: Radiation Oncology

## 2016-04-19 DIAGNOSIS — Z510 Encounter for antineoplastic radiation therapy: Secondary | ICD-10-CM | POA: Diagnosis not present

## 2016-04-20 ENCOUNTER — Ambulatory Visit
Admission: RE | Admit: 2016-04-20 | Discharge: 2016-04-20 | Disposition: A | Discharge status: Home / Self Care | Payer: Federal, State, Local not specified - PPO | Source: Ambulatory Visit | Attending: Radiation Oncology | Admitting: Radiation Oncology

## 2016-04-20 ENCOUNTER — Encounter: Payer: Self-pay | Admitting: Radiation Oncology

## 2016-04-20 VITALS — BP 129/78 | HR 89 | Temp 98.4°F | Ht 64.0 in | Wt 243.8 lb

## 2016-04-20 DIAGNOSIS — C50412 Malignant neoplasm of upper-outer quadrant of left female breast: Secondary | ICD-10-CM

## 2016-04-20 DIAGNOSIS — Z510 Encounter for antineoplastic radiation therapy: Secondary | ICD-10-CM | POA: Diagnosis not present

## 2016-04-20 MED ORDER — SONAFINE EX EMUL
1.0000 "application " | Freq: Two times a day (BID) | CUTANEOUS | Status: DC
  Administered 2016-04-20: 1 via TOPICAL

## 2016-04-20 NOTE — Progress Notes (Signed)
Department of Radiation Oncology  Phone:  [phone] Fax:        [phone]  Weekly Treatment Note    Name: Cindy Bradshaw Date: 04/20/2016 MRN: [id number] DOB: Oct 16, 1970   Diagnosis:     ICD-9-CM ICD-10-CM   1. Malignant neoplasm of upper-outer quadrant of left female breast ([facility]CC) 174.4 C50.412 SONAFINE emulsion 1 application  2. Breast cancer of upper-outer quadrant of left female breast ([place]) 174.4 C50.412      Current dose: 36 Gy  Current fraction: 20   MEDICATIONS: Current Outpatient Prescriptions  Medication Sig Dispense Refill  . albuterol (PROVENTIL HFA;VENTOLIN HFA) 108 (90 Base) MCG/ACT inhaler Inhale 2 puffs into the lungs every 4 (four) hours as needed for wheezing or shortness of breath.    . diphenhydrAMINE (BENADRYL) 25 mg capsule Take 1 capsule (25 mg total) by mouth every 6 (six) hours as needed for itching. (Patient not taking: Reported on 04/12/2016) 30 capsule 0  . escitalopram (LEXAPRO) 20 MG tablet Take 20 mg by mouth at bedtime.    . gabapentin (NEURONTIN) 300 MG capsule Take 3 capsules (900 mg total) by mouth 3 (three) times daily. 180 capsule 2  . hyaluronate sodium (RADIAPLEXRX) GEL Apply 1 application topically 2 (two) times daily.    Marland Kitchen ibuprofen (ADVIL,MOTRIN) 200 MG tablet Take 1 tablet by mouth every 8 (eight) hours as needed for mild pain. Reported on 03/12/2016    . methocarbamol (ROBAXIN) 500 MG tablet Take 1 tablet (500 mg total) by mouth every 8 (eight) hours as needed for muscle spasms. 30 tablet 0  . non-metallic deodorant (ALRA) MISC Apply 1 application topically daily as needed.    . ondansetron (ZOFRAN ODT) 4 MG disintegrating tablet Take 1 tablet (4 mg total) by mouth every 8 (eight) hours as needed for nausea or vomiting. 20 tablet 0  . oxyCODONE (OXY IR/ROXICODONE) 5 MG immediate release tablet Take 1-2 tablets (5-10 mg total) by mouth every 4 (four) hours as needed for moderate pain or severe pain. 50 tablet 0  . triamcinolone  cream (KENALOG) 0.1 % Apply 1 application topically 2 (two) times daily as needed (For eczema.). 160 g 1   Current Facility-Administered Medications  Medication Dose Route Frequency Provider Last Rate Last Dose  . SONAFINE emulsion 1 application  1 application Topical BID Kyung Rudd, MD   1 application at [phone]   Facility-Administered Medications Ordered in Other Encounters  Medication Dose Route Frequency Provider Last Rate Last Dose  . 0.9 %  sodium chloride infusion   Intravenous Once Laurie Panda, NP         ALLERGIES: Tape; Dilaudid [hydromorphone hcl]; Ivp dye [iodinated diagnostic agents]; Percocet [oxycodone-acetaminophen]; and Toradol [ketorolac tromethamine]   LABORATORY DATA:  Lab Results  Component Value Date   WBC 3.7 (L) 04/12/2016   HGB 11.8 (L) 04/12/2016   HCT 37.1 04/12/2016   MCV 90.7 04/12/2016   PLT 367 04/12/2016   Lab Results  Component Value Date   NA 139 04/12/2016   K 3.6 04/12/2016   CL 108 04/12/2016   CO2 26 04/12/2016   Lab Results  Component Value Date   ALT 56 (H) 04/12/2016   AST 47 (H) 04/12/2016   ALKPHOS 60 04/12/2016   BILITOT 0.5 04/12/2016     NARRATIVE: Cindy Bradshaw was seen today for weekly treatment management. The chart was checked and the patient's films were reviewed.  Cindy Bradshaw has received 20 fractions to her left breast.  She  admits to fatigue and also has level 5/10 tenderness in her left breast.  Note marked hyperpigmentation of the tx field with intact skin.  Reports itching in the left lateral breast region at the incisional area without visible rash.   Requesting cream for itching.   BP 129/78 (BP Location: Right Arm, Patient Position: Sitting, Cuff Size: Small)   Pulse 89   Temp 98.4 F (36.9 C) (Oral)   Ht 5\' 4"  (1.626 m)   Wt 243 lb 12.8 oz (110.6 kg)   BMI 41.85 kg/m    Wt Readings from Last 3 Encounters:  04/20/16 243 lb 12.8 oz (110.6 kg)  04/13/16 250 lb 4.8 oz (113.5 kg)  04/12/16 249  lb (112.9 kg)    PHYSICAL EXAMINATION: height is 5\' 4"  (1.626 m) and weight is 243 lb 12.8 oz (110.6 kg). Her oral temperature is 98.4 F (36.9 C). Her blood pressure is 129/78 and her pulse is 89.      Hyperpigmentation present in the treatment area  ASSESSMENT: The patient is doing satisfactorily with treatment.  PLAN: We will continue with the patient's radiation treatment as planned.

## 2016-04-20 NOTE — Progress Notes (Addendum)
Cindy Bradshaw has received 20 fractions to her left breast.  She admits to fatigue and also has level 5/10 tenderness in her left breast.  Note marked hyperpigmentation of the tx field with intact skin.  Reports itching in the left lateral breast region at the incisional area without visible rash.   Requesting cream for itching.   BP 129/78 (BP Location: Right Arm, Patient Position: Sitting, Cuff Size: Small)   Pulse 89   Temp 98.4 F (36.9 C) (Oral)   Ht 5\' 4"  (1.626 m)   Wt 243 lb 12.8 oz (110.6 kg)   BMI 41.85 kg/m    Wt Readings from Last 3 Encounters:  04/20/16 243 lb 12.8 oz (110.6 kg)  04/13/16 250 lb 4.8 oz (113.5 kg)  04/12/16 249 lb (112.9 kg)

## 2016-04-23 ENCOUNTER — Ambulatory Visit (INDEPENDENT_AMBULATORY_CARE_PROVIDER_SITE_OTHER): Payer: Federal, State, Local not specified - PPO | Admitting: Allergy

## 2016-04-23 ENCOUNTER — Encounter: Payer: Self-pay | Admitting: Allergy

## 2016-04-23 ENCOUNTER — Ambulatory Visit
Admission: RE | Admit: 2016-04-23 | Discharge: 2016-04-23 | Disposition: A | Discharge status: Home / Self Care | Payer: Federal, State, Local not specified - PPO | Source: Ambulatory Visit | Attending: Radiation Oncology | Admitting: Radiation Oncology

## 2016-04-23 VITALS — BP 130/85 | HR 80 | Temp 98.6°F | Resp 18 | Ht 64.57 in | Wt 246.8 lb

## 2016-04-23 DIAGNOSIS — L299 Pruritus, unspecified: Secondary | ICD-10-CM | POA: Diagnosis not present

## 2016-04-23 DIAGNOSIS — J4520 Mild intermittent asthma, uncomplicated: Secondary | ICD-10-CM

## 2016-04-23 DIAGNOSIS — Z510 Encounter for antineoplastic radiation therapy: Secondary | ICD-10-CM | POA: Diagnosis not present

## 2016-04-23 LAB — TSH: TSH: 1.42 m[IU]/L

## 2016-04-23 MED ORDER — HYDROXYZINE PAMOATE 25 MG PO CAPS: 25.0000 mg | ORAL_CAPSULE | Freq: Every day | ORAL | 5 refills | Status: DC

## 2016-04-23 NOTE — Patient Instructions (Signed)
Pruritus (itching) Start Allegra 180mg  and Zantac 150mg  twice a day.  Take Vistaril 25mg  at bedtime.  Will check for environmental allergy with aeroallergen panel and check TSH today.   Keep your upcoming appt with dermatology  Follow-up 2-3 months

## 2016-04-23 NOTE — Progress Notes (Signed)
New Patient Note  RE: Cindy Bradshaw MRN: [id number] DOB: [birth year] Date of Office Visit: 04/23/2016  Referring provider: Ronnell Guadalajara* Primary care provider: [name], MD  Chief Complaint: itching History of present illness: Cindy Bradshaw is a 45 y.o. female presenting today for consultation for pruritus.   She has history of breast cancer and is currently undergoing radiation daily for 6 weeks that she start earlier this month.    She reports she itches all over.  She can wake up and start itching or walk outside and start itching.  She has not been able to identify the cause of her itchiness.  She is not sure if she is allergic to sometime in the environment.  Itch is all throughout the day and migrates around her body.  She does report she will notice a fine bumpy rash with the itch and sometimes will see "squiggly lines" with the rash.  Rash and itch are also worse as pressure points like her bra and panty line.   She does note that oxycodone makes her itch worse and she has to take benadryl when she is taking oxycodone which she reports now if rather infrequent.    Itching started around Jan 2016 at the time she was going thru breast cancer treatment with chemotherapy and surgery.  She did chemo from Jan - May.  She recalls being on several different medications for nausea management which were all new medications for her at the time.        Denies any swelling.  Typically no fevers but she did have febrile illness about a week ago and went to [facility].  The rash/itch did get worse with this illness.    No new spas/lotions/detergent. No stings.  No food triggers.  No recent travel.  Household members without similar symptoms.   She has been given triamcinolone cream which helps temporarily.  Most of the time she has to take benadryl throughout the day and night.   She has also tried hydrocortisone cream and, Gold bond for eczema which did not help much.    Her PCP  advised her to get Zyrtec which she reports was not that helpful.    She moved here from [place] about 2 years ago and didn't have this issue while she was living there.    No nasal or ocular symptoms. No food allergy.  Has asthma diagnosed as a child.  She reports she has not had an exacerbation in over a year.  Does have an albuterol inhaler and has not needed to use this year.  Not on ICS currently.     Personally reviewed OV note form Dr. Fara Olden from 03/23/16 recommending starting  melatonin for insomnia, lexapro from anxiety/depression and derm and allergy referral for skin testing.    Review of systems: Review of Systems  Constitutional: Positive for malaise/fatigue.  HENT: Negative for congestion and sore throat.   Eyes: Negative for redness.  Respiratory: Negative for cough, shortness of breath and wheezing.   Cardiovascular: Negative for chest pain.  Gastrointestinal: Negative for nausea and vomiting.  Skin: Positive for itching and rash.  Neurological: Negative for headaches.    All other systems negative unless noted above in HPI  Past medical history: Past Medical History:  Diagnosis Date  . Arthritis   . Asthma   . Back disorder    degenerative disk disease  . Breast cancer ([place])   . Cancer ([place])   . GERD (gastroesophageal reflux  disease)   . Mammogram abnormal 08/24/15   first  . Migraine   . Pap smear for cervical cancer screening 08/12/15    Past surgical history: Past Surgical History:  Procedure Laterality Date  . BREAST LUMPECTOMY WITH RADIOACTIVE SEED AND SENTINEL LYMPH NODE BIOPSY Left 02/13/2016   Procedure: BREAST LUMPECTOMY WITH RADIOACTIVE SEED AND SENTINEL LYMPH NODE BIOPSY;  Surgeon: Excell Seltzer, MD;  Location: [facility];  Service: General;  Laterality: Left;  . BREAST REDUCTION SURGERY Bilateral 02/21/2016   Procedure: MAMMARY REDUCTION  (BREAST)/Oncoplastic breast reconstruction;  Surgeon: Irene Limbo, MD;   Location: [facility];  Service: Plastics;  Laterality: Bilateral;  . CARDIAC CATHETERIZATION [name] 01/24/2016   Procedure: Left Heart Cath and Coronary Angiography;  Surgeon: Leonie Man, MD;  Location: [facility];  Service: Cardiovascular;  Laterality: [name];  . CESAREAN SECTION    . CHOLECYSTECTOMY    . PERIPHERAL VASCULAR CATHETERIZATION Right 02/13/2016   Procedure: PORTA CATH REMOVAL;  Surgeon: Excell Seltzer, MD;  Location: [facility];  Service: General;  Laterality: Right;  . PORTACATH PLACEMENT [name] 09/12/2015   Procedure: INSERTION PORT-A-CATH;  Surgeon: Excell Seltzer, MD;  Location: [facility];  Service: General;  Laterality: [name];    Family history:  Family History  Problem Relation Age of Onset  . Lung cancer Mother 19    2 different types of lung cancer; metastasis to brain; smoker  . Other Mother     hx of hysterectomy for unspecified reason  . Bone cancer Maternal Uncle     dx. early 65s  . Breast cancer Maternal Grandmother     dx. early 57s, s/p mastectomy  . Cancer Paternal Grandfather     unspecified type of cancer, dx. late 73s  . Congestive Heart Failure Father     smoker  . Stroke Father   . Eczema Father   . Cirrhosis Maternal Grandfather   . Heart Problems Maternal Grandfather   . Asthma Daughter   . Allergies Daughter     hives  . Lung cancer Other     (maternal great uncle; MGM's brother); had a coal stove  . Cancer Cousin     unspecified type; d. early age (paternal first cousin once-removed)  . Cancer Cousin     dx. as a kid; in remission today; (paternal 2nd cousin)  . Cancer Cousin     unspecified type; d. early 32s; (maternal 1st cousin)    Social history: Social History   Social History  . Marital status: Married    Spouse name: Cindy Bradshaw  . Number of children: 2   Occupational History  . Web designer    Social History Main Topics  . Smoking status: Never Smoker  . Smokeless tobacco:  Never Used  . Alcohol use 0.0 oz/week     Comment: occassional glass of wine  . Drug use: No  . Sexual activity: Yes   Social History Narrative   Dog in home.   No carpeting in home.  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     Medication List:   Medication List       Accurate as of 04/23/16 10:05 AM. Always use your most recent med list.          albuterol 108 (90 Base) MCG/ACT inhaler Commonly known as:  PROVENTIL HFA;VENTOLIN HFA Inhale 2 puffs into the lungs every 4 (four) hours as needed for wheezing or shortness of breath.   diphenhydrAMINE 25 mg  capsule Commonly known as:  BENADRYL Take 1 capsule (25 mg total) by mouth every 6 (six) hours as needed for itching.   escitalopram 20 MG tablet Commonly known as:  LEXAPRO Take 20 mg by mouth at bedtime.   gabapentin 300 MG capsule Commonly known as:  NEURONTIN Take 3 capsules (900 mg total) by mouth 3 (three) times daily.   hyaluronate sodium Gel Apply 1 application topically 2 (two) times daily.   hydrOXYzine 25 MG capsule Commonly known as:  VISTARIL Take 1 capsule (25 mg total) by mouth at bedtime.   ibuprofen 200 MG tablet Commonly known as:  ADVIL,MOTRIN Take 1 tablet by mouth every 8 (eight) hours as needed for mild pain. Reported on 03/12/2016   methocarbamol 500 MG tablet Commonly known as:  ROBAXIN Take 1 tablet (500 mg total) by mouth every 8 (eight) hours as needed for muscle spasms.   non-metallic deodorant Misc Commonly known as:  ALRA Apply 1 application topically daily as needed.   ondansetron 4 MG disintegrating tablet Commonly known as:  ZOFRAN ODT Take 1 tablet (4 mg total) by mouth every 8 (eight) hours as needed for nausea or vomiting.   oxyCODONE 5 MG immediate release tablet Commonly known as:  Oxy IR/ROXICODONE Take 1-2 tablets (5-10 mg total) by mouth every 4 (four) hours as needed for moderate pain or severe pain.   triamcinolone ointment 0.1 % Commonly known as:  KENALOG APPLY 1  APPLICATION TOPICALLY 2 (TWO) TIMES DAILY AS NEEDED (FOR ECZEMA.).       Known medication allergies: Allergies  Allergen Reactions  . Tape Rash and Other (See Comments)    Itching, red bumps from  Hypafix  Tape.  . Dilaudid [Hydromorphone Hcl] Itching  . Ivp Dye [Iodinated Diagnostic Agents] Itching  . Percocet [Oxycodone-Acetaminophen] Itching  . Toradol [Ketorolac Tromethamine] Itching     Physical examination: Blood pressure 130/85, pulse 80, temperature 98.6 F (37 C), temperature source Oral, resp. rate 18, height 5' 4.57" (1.64 m), weight 246 lb 12.8 oz (111.9 kg).  General: Alert, interactive, in no acute distress. HEENT: TMs pearly gray, turbinates non-edematous without discharge, post-pharynx non erythematous. Neck: Supple without lymphadenopathy. Lungs: Clear to auscultation without wheezing, rhonchi or rales. {no increased work of breathing. CV: Normal S1, S2 without murmurs. Abdomen: Nondistended, nontender. Skin: dry skin, no urticarial lesions. Extremities:  No clubbing, cyanosis or edema. Neuro:   Grossly intact.  Diagnositics/Labs: Labs:  Component     Latest Ref Rng & Units 04/12/2016  Sodium     135 - 145 mmol/L 139  Potassium     3.5 - 5.1 mmol/L 3.6  Chloride     101 - 111 mmol/L 108  CO2     22 - 32 mmol/L 26  Glucose     65 - 99 mg/dL 100 (H)  BUN     6 - 20 mg/dL 12  Creatinine     0.44 - 1.00 mg/dL 0.79  Calcium     8.9 - 10.3 mg/dL 9.8  Total Protein     6.5 - 8.1 g/dL 7.1  Albumin     3.5 - 5.0 g/dL 3.8  AST     15 - 41 U/L 47 (H)  ALT     14 - 54 U/L 56 (H)  Alkaline Phosphatase     38 - 126 U/L 60  Total Bilirubin     0.3 - 1.2 mg/dL 0.5  EGFR (Non-African Amer.)     >60 mL/min >60  EGFR (African American)     >60 mL/min >60  Anion gap     5 - 15 5   Component     Latest Ref Rng & Units 04/10/2016  WBC     3.9 - 10.3 10e3/uL 3.9  NEUT#     1.5 - 6.5 10e3/uL 2.4  Hemoglobin     11.6 - 15.9 g/dL 11.6  HCT     34.8 -  46.6 % 36.4  Platelets     145 - 400 10e3/uL 350  MCV     79.5 - 101.0 fL 89.2  MCH     25.1 - 34.0 pg 28.5  MCHC     31.5 - 36.0 g/dL 32.0  RBC     3.70 - 5.45 10e6/uL 4.08  RDW     11.2 - 14.5 % 14.3  lymph#     0.9 - 3.3 10e3/uL 0.9  MONO#     0.1 - 0.9 10e3/uL 0.5  Eosinophils Absolute     0.0 - 0.5 10e3/uL 0.2  Basophils Absolute     0.0 - 0.1 10e3/uL 0.0  NEUT%     38.4 - 76.8 % 61.9  LYMPH%     14.0 - 49.7 % 21.7  MONO%     0.0 - 14.0 % 12.0  EOS%     0.0 - 7.0 % 4.1  BASO%     0.0 - 2.0 % 0.3    Assessment and plan:   Pruritus Description of rash associated with pruritus does not seem c/w with urticaria however does report serpiginous lesions.   Malignancy can manifest as pruritus however not usually associated with breast cancers.   Labs previously obtained does show mild elevation AST/ALT and would monitor.   Start Allegra 167m and Zantac 1586mtwice a day.  Take Vistaril 2538mt bedtime.  Can use benadryl as needed for breakthrough symptoms.  Will check for environmental allergy with aeroallergen panel and check TSH today.   Advised that opiates and NSAIDs can exacerbate pruritus and urticaria and would avoid as much as possible.   Keep your upcoming appt with dermatology  Asthma, mild intermittent Well-controlled with as needed albuterol, continue as needed use.   Follow-up 2-3 months  I appreciate the opportunity to take part in Cindy Bradshaw's care. Please do not hesitate to contact me with questions.  Sincerely,   ShaPrudy FeelerD [facility] [place]

## 2016-04-24 ENCOUNTER — Ambulatory Visit
Admission: RE | Admit: 2016-04-24 | Discharge: 2016-04-24 | Disposition: A | Discharge status: Home / Self Care | Payer: Federal, State, Local not specified - PPO | Source: Ambulatory Visit | Attending: Radiation Oncology | Admitting: Radiation Oncology

## 2016-04-24 DIAGNOSIS — Z510 Encounter for antineoplastic radiation therapy: Secondary | ICD-10-CM | POA: Diagnosis not present

## 2016-04-24 LAB — CP584 ZONE 3
ALLERGEN, D PTERNOYSSINUS, D1: 0.77 kU/L — AB
Allergen, A. alternata, m6: <0.1 kU/L
Allergen, Black Locust, Acacia9: <0.1 kU/L
Allergen, Cedar tree, t12: <0.1 kU/L
Allergen, Comm Silver Birch, t9: <0.1 kU/L
Allergen, S. Botryosum, m10: <0.1 kU/L
Cat Dander: <0.1 kU/L
Cockroach: 0.24 kU/L — ABNORMAL HIGH
Common Ragweed: <0.1 kU/L
D. FARINAE: 0.42 kU/L — AB
Johnson Grass: <0.1 kU/L
Pecan/Hickory Tree IgE: <0.1 kU/L
Plantain: <0.1 kU/L
Rough Pigweed  IgE: <0.1 kU/L

## 2016-04-25 ENCOUNTER — Ambulatory Visit
Admission: RE | Admit: 2016-04-25 | Discharge: 2016-04-25 | Disposition: A | Discharge status: Home / Self Care | Payer: Federal, State, Local not specified - PPO | Source: Ambulatory Visit | Attending: Radiation Oncology | Admitting: Radiation Oncology

## 2016-04-25 DIAGNOSIS — Z510 Encounter for antineoplastic radiation therapy: Secondary | ICD-10-CM | POA: Diagnosis not present

## 2016-04-26 ENCOUNTER — Ambulatory Visit
Admission: RE | Admit: 2016-04-26 | Discharge: 2016-04-26 | Disposition: A | Discharge status: Home / Self Care | Payer: Federal, State, Local not specified - PPO | Source: Ambulatory Visit | Attending: Radiation Oncology | Admitting: Radiation Oncology

## 2016-04-26 DIAGNOSIS — Z510 Encounter for antineoplastic radiation therapy: Secondary | ICD-10-CM | POA: Diagnosis not present

## 2016-04-27 ENCOUNTER — Encounter: Payer: Self-pay | Admitting: Radiation Oncology

## 2016-04-27 ENCOUNTER — Ambulatory Visit
Admission: RE | Admit: 2016-04-27 | Discharge: 2016-04-27 | Disposition: A | Discharge status: Home / Self Care | Payer: Federal, State, Local not specified - PPO | Source: Ambulatory Visit | Attending: Radiation Oncology | Admitting: Radiation Oncology

## 2016-04-27 ENCOUNTER — Ambulatory Visit: Payer: Federal, State, Local not specified - PPO | Admitting: Radiation Oncology

## 2016-04-27 VITALS — BP 131/87 | HR 91 | Temp 99.3°F | Resp 18 | Wt 246.5 lb

## 2016-04-27 DIAGNOSIS — Z510 Encounter for antineoplastic radiation therapy: Secondary | ICD-10-CM | POA: Diagnosis not present

## 2016-04-27 DIAGNOSIS — C50412 Malignant neoplasm of upper-outer quadrant of left female breast: Secondary | ICD-10-CM

## 2016-04-27 NOTE — Progress Notes (Addendum)
Weekly rad txs left breast 25  Completed, hyperpigmentation, skin intact,using radiaplex bid,  C/o tenderness under left axilla, on zantac and allegra from allergist, has helped her itching BP 131/87 (BP Location: Right Arm, Patient Position: Sitting, Cuff Size: Normal)   Pulse 91   Temp 99.3 F (37.4 C) (Oral)   Resp 18   Wt 246 lb 8 oz (111.8 kg)   BMI 41.57 kg/m   Wt Readings from Last 3 Encounters:  04/27/16 246 lb 8 oz (111.8 kg)  04/23/16 246 lb 12.8 oz (111.9 kg)  04/20/16 243 lb 12.8 oz (110.6 kg)

## 2016-04-27 NOTE — Progress Notes (Signed)
Department of Radiation Oncology  Phone:  [phone] Fax:        [phone]  Weekly Treatment Note    Name: Cindy Bradshaw Date: 04/29/2016 MRN: [id number] DOB: May 24, 1971   Diagnosis:     ICD-9-CM ICD-10-CM   1. Breast cancer of upper-outer quadrant of left female breast ([place]) 174.4 C50.412      Current dose: 45 Gy  Current fraction: 25   MEDICATIONS: Current Outpatient Prescriptions  Medication Sig Dispense Refill  . albuterol (PROVENTIL HFA;VENTOLIN HFA) 108 (90 Base) MCG/ACT inhaler Inhale 2 puffs into the lungs every 4 (four) hours as needed for wheezing or shortness of breath.    . diphenhydrAMINE (BENADRYL) 25 mg capsule Take 1 capsule (25 mg total) by mouth every 6 (six) hours as needed for itching. 30 capsule 0  . escitalopram (LEXAPRO) 20 MG tablet Take 20 mg by mouth at bedtime.    . fexofenadine (ALLEGRA) 180 MG tablet Take 180 mg by mouth 2 (two) times daily.    Marland Kitchen gabapentin (NEURONTIN) 300 MG capsule Take 3 capsules (900 mg total) by mouth 3 (three) times daily. 180 capsule 2  . hyaluronate sodium (RADIAPLEXRX) GEL Apply 1 application topically 2 (two) times daily.    . hydrOXYzine (VISTARIL) 25 MG capsule Take 1 capsule (25 mg total) by mouth at bedtime. 30 capsule 5  . ibuprofen (ADVIL,MOTRIN) 200 MG tablet Take 1 tablet by mouth every 8 (eight) hours as needed for mild pain. Reported on 03/12/2016    . methocarbamol (ROBAXIN) 500 MG tablet Take 1 tablet (500 mg total) by mouth every 8 (eight) hours as needed for muscle spasms. 30 tablet 0  . non-metallic deodorant (ALRA) MISC Apply 1 application topically daily as needed.    . ranitidine (ZANTAC) 150 MG tablet Take 150 mg by mouth 2 (two) times daily.    Marland Kitchen triamcinolone ointment (KENALOG) 0.1 % APPLY 1 APPLICATION TOPICALLY 2 (TWO) TIMES DAILY AS NEEDED (FOR ECZEMA.).  1  . ondansetron (ZOFRAN ODT) 4 MG disintegrating tablet Take 1 tablet (4 mg total) by mouth every 8 (eight) hours as needed for nausea or  vomiting. (Patient not taking: Reported on 04/23/2016) 20 tablet 0  . oxyCODONE (OXY IR/ROXICODONE) 5 MG immediate release tablet Take 1-2 tablets (5-10 mg total) by mouth every 4 (four) hours as needed for moderate pain or severe pain. (Patient not taking: Reported on 04/23/2016) 50 tablet 0   No current facility-administered medications for this encounter.    Facility-Administered Medications Ordered in Other Encounters  Medication Dose Route Frequency Provider Last Rate Last Dose  . 0.9 %  sodium chloride infusion   Intravenous Once Laurie Panda, NP         ALLERGIES: Tape; Dilaudid [hydromorphone hcl]; Ivp dye [iodinated diagnostic agents]; Percocet [oxycodone-acetaminophen]; and Toradol [ketorolac tromethamine]   LABORATORY DATA:  Lab Results  Component Value Date   WBC 3.7 (L) 04/12/2016   HGB 11.8 (L) 04/12/2016   HCT 37.1 04/12/2016   MCV 90.7 04/12/2016   PLT 367 04/12/2016   Lab Results  Component Value Date   NA 139 04/12/2016   K 3.6 04/12/2016   CL 108 04/12/2016   CO2 26 04/12/2016   Lab Results  Component Value Date   ALT 56 (H) 04/12/2016   AST 47 (H) 04/12/2016   ALKPHOS 60 04/12/2016   BILITOT 0.5 04/12/2016     NARRATIVE: Cindy Bradshaw was seen today for weekly treatment management. The chart was checked and the  patient's films were reviewed.  Weekly rad txs left breast 25 completed. Hyperpigmentation, skin intact, using radiaplex bid. C/o tenderness under left axilla, on zantac and allegra from allergist that has helped her pruritus.  BP 131/87 (BP Location: Right Arm, Patient Position: Sitting, Cuff Size: Normal)   Pulse 91   Temp 99.3 F (37.4 C) (Oral)   Resp 18   Wt 246 lb 8 oz (111.8 kg)   BMI 41.57 kg/m    Wt Readings from Last 3 Encounters:  04/27/16 246 lb 8 oz (111.8 kg)  04/23/16 246 lb 12.8 oz (111.9 kg)  04/20/16 243 lb 12.8 oz (110.6 kg)    PHYSICAL EXAMINATION: weight is 246 lb 8 oz (111.8 kg). Her oral temperature is  99.3 F (37.4 C). Her blood pressure is 131/87 and her pulse is 91. Her respiration is 18.    Increased hyperpigmentation in the left axilla and in the inframammary region without moist desquamation.  ASSESSMENT: The patient is doing satisfactorily with treatment.  PLAN: We will continue with the patient's radiation treatment as planned.   This document serves as a record of services personally performed by Kyung Rudd, MD. It was created on his behalf by Darcus Austin, a trained medical scribe. The creation of this record is based on the scribe's personal observations and the provider's statements to them. This document has been checked and approved by the attending provider.

## 2016-04-30 ENCOUNTER — Ambulatory Visit
Admission: RE | Admit: 2016-04-30 | Discharge: 2016-04-30 | Disposition: A | Discharge status: Home / Self Care | Payer: Federal, State, Local not specified - PPO | Source: Ambulatory Visit | Attending: Radiation Oncology | Admitting: Radiation Oncology

## 2016-04-30 DIAGNOSIS — Z510 Encounter for antineoplastic radiation therapy: Secondary | ICD-10-CM | POA: Diagnosis not present

## 2016-05-01 ENCOUNTER — Ambulatory Visit
Admission: RE | Admit: 2016-05-01 | Discharge: 2016-05-01 | Disposition: A | Discharge status: Home / Self Care | Payer: Federal, State, Local not specified - PPO | Source: Ambulatory Visit | Attending: Radiation Oncology | Admitting: Radiation Oncology

## 2016-05-01 DIAGNOSIS — Z510 Encounter for antineoplastic radiation therapy: Secondary | ICD-10-CM | POA: Diagnosis not present

## 2016-05-02 ENCOUNTER — Encounter: Payer: Self-pay | Admitting: Radiation Oncology

## 2016-05-02 ENCOUNTER — Ambulatory Visit
Admission: RE | Admit: 2016-05-02 | Discharge: 2016-05-02 | Disposition: A | Discharge status: Home / Self Care | Payer: Federal, State, Local not specified - PPO | Source: Ambulatory Visit | Attending: Radiation Oncology | Admitting: Radiation Oncology

## 2016-05-02 VITALS — BP 136/82 | HR 92 | Temp 98.4°F | Ht 64.57 in | Wt 247.2 lb

## 2016-05-02 DIAGNOSIS — Z510 Encounter for antineoplastic radiation therapy: Secondary | ICD-10-CM | POA: Diagnosis not present

## 2016-05-02 DIAGNOSIS — C50412 Malignant neoplasm of upper-outer quadrant of left female breast: Secondary | ICD-10-CM

## 2016-05-02 NOTE — Progress Notes (Signed)
Department of Radiation Oncology  Phone:  [phone] Fax:        [phone]  Weekly Treatment Note    Name: Cindy Bradshaw Date: 05/02/2016 MRN: [id number] DOB: [birth year]   Diagnosis:     ICD-9-CM ICD-10-CM   1. Breast cancer of upper-outer quadrant of left female breast ([place]) 174.4 C50.412      Current dose: 50.4 Gy  Current fraction: 28   MEDICATIONS: Current Outpatient Prescriptions  Medication Sig Dispense Refill  . albuterol (PROVENTIL HFA;VENTOLIN HFA) 108 (90 Base) MCG/ACT inhaler Inhale 2 puffs into the lungs every 4 (four) hours as needed for wheezing or shortness of breath.    . escitalopram (LEXAPRO) 20 MG tablet Take 20 mg by mouth at bedtime.    . fexofenadine (ALLEGRA) 180 MG tablet Take 180 mg by mouth 2 (two) times daily.    Marland Kitchen gabapentin (NEURONTIN) 300 MG capsule Take 3 capsules (900 mg total) by mouth 3 (three) times daily. 180 capsule 2  . hydrOXYzine (VISTARIL) 25 MG capsule Take 1 capsule (25 mg total) by mouth at bedtime. 30 capsule 5  . non-metallic deodorant (ALRA) MISC Apply 1 application topically daily as needed.    . ranitidine (ZANTAC) 150 MG tablet Take 150 mg by mouth 2 (two) times daily.    Marland Kitchen triamcinolone ointment (KENALOG) 0.1 % APPLY 1 APPLICATION TOPICALLY 2 (TWO) TIMES DAILY AS NEEDED (FOR ECZEMA.).  1  . Wound Dressings (SONAFINE) Apply 1 application topically 3 (three) times daily.    . diphenhydrAMINE (BENADRYL) 25 mg capsule Take 1 capsule (25 mg total) by mouth every 6 (six) hours as needed for itching. (Patient not taking: Reported on 05/02/2016) 30 capsule 0  . ibuprofen (ADVIL,MOTRIN) 200 MG tablet Take 1 tablet by mouth every 8 (eight) hours as needed for mild pain. Reported on 03/12/2016    . methocarbamol (ROBAXIN) 500 MG tablet Take 1 tablet (500 mg total) by mouth every 8 (eight) hours as needed for muscle spasms. (Patient not taking: Reported on 05/02/2016) 30 tablet 0  . ondansetron (ZOFRAN ODT) 4 MG disintegrating tablet  Take 1 tablet (4 mg total) by mouth every 8 (eight) hours as needed for nausea or vomiting. (Patient not taking: Reported on 04/23/2016) 20 tablet 0  . oxyCODONE (OXY IR/ROXICODONE) 5 MG immediate release tablet Take 1-2 tablets (5-10 mg total) by mouth every 4 (four) hours as needed for moderate pain or severe pain. (Patient not taking: Reported on 04/23/2016) 50 tablet 0   No current facility-administered medications for this encounter.    Facility-Administered Medications Ordered in Other Encounters  Medication Dose Route Frequency Provider Last Rate Last Dose  . 0.9 %  sodium chloride infusion   Intravenous Once Laurie Panda, NP         ALLERGIES: Tape; Dilaudid [hydromorphone hcl]; Ivp dye [iodinated diagnostic agents]; Percocet [oxycodone-acetaminophen]; and Toradol [ketorolac tromethamine]   LABORATORY DATA:  Lab Results  Component Value Date   WBC 3.7 (L) 04/12/2016   HGB 11.8 (L) 04/12/2016   HCT 37.1 04/12/2016   MCV 90.7 04/12/2016   PLT 367 04/12/2016   Lab Results  Component Value Date   NA 139 04/12/2016   K 3.6 04/12/2016   CL 108 04/12/2016   CO2 26 04/12/2016   Lab Results  Component Value Date   ALT 56 (H) 04/12/2016   AST 47 (H) 04/12/2016   ALKPHOS 60 04/12/2016   BILITOT 0.5 04/12/2016     NARRATIVE: Cindy Bradshaw was seen  today for weekly treatment management. The chart was checked and the patient's films were reviewed. The patient reports a level 7/10 pain in her left breast. She describes the pain as a shooting pain which has worsened over the course of therapy. Note marked hyperpigmentation in the axillary region with dryness, and mild hyperpigmentation in the rest of the treatment field. Skin remains intact anteriorly and note dry isolated peeling in the inframmary fold at the incisional region. Given sonafine.  PHYSICAL EXAMINATION: height is 5' 4.57" (1.64 m) and weight is 247 lb 3.2 oz (112.1 kg). Her oral temperature is 98.4 F (36.9 C).  Her blood pressure is 136/82 and her pulse is 92.   Hyperpigmentation in the treatment area and early dry desquamation under the breast.  ASSESSMENT: The patient is doing satisfactorily with treatment.  PLAN: We will continue with the patient's radiation treatment as planned.    This document serves as a record of services personally performed by Kyung Rudd, MD. It was created on his behalf by Bethann Humble, a trained medical scribe. The creation of this record is based on the scribe's personal observations and the provider's statements to them. This document has been checked and approved by the attending provider.

## 2016-05-02 NOTE — Progress Notes (Signed)
Cindy Bradshaw has received 28 fractions to her left breast.  She reports level 7/10 pain in her left breast - "shooting pains" which have worsened over the course of therapy.  Note marked hyperpigmentation in the in the axillary region with dryness, and mild hyperpigmentation in the rest of the tx field.  Skin remains intact anteriorally and note dry isolated peeling in the inframmary fold at the incisional region.  Given sonafine

## 2016-05-03 ENCOUNTER — Ambulatory Visit
Admission: RE | Admit: 2016-05-03 | Discharge: 2016-05-03 | Disposition: A | Discharge status: Home / Self Care | Payer: Federal, State, Local not specified - PPO | Source: Ambulatory Visit | Attending: Radiation Oncology | Admitting: Radiation Oncology

## 2016-05-03 DIAGNOSIS — Z510 Encounter for antineoplastic radiation therapy: Secondary | ICD-10-CM | POA: Diagnosis not present

## 2016-05-04 ENCOUNTER — Ambulatory Visit
Admission: RE | Admit: 2016-05-04 | Discharge: 2016-05-04 | Disposition: A | Discharge status: Home / Self Care | Payer: Federal, State, Local not specified - PPO | Source: Ambulatory Visit | Attending: Radiation Oncology | Admitting: Radiation Oncology

## 2016-05-04 DIAGNOSIS — Z510 Encounter for antineoplastic radiation therapy: Secondary | ICD-10-CM | POA: Diagnosis not present

## 2016-05-08 ENCOUNTER — Ambulatory Visit
Admission: RE | Admit: 2016-05-08 | Discharge: 2016-05-08 | Disposition: A | Discharge status: Home / Self Care | Payer: Federal, State, Local not specified - PPO | Source: Ambulatory Visit | Attending: Radiation Oncology | Admitting: Radiation Oncology

## 2016-05-08 DIAGNOSIS — Z510 Encounter for antineoplastic radiation therapy: Secondary | ICD-10-CM | POA: Diagnosis not present

## 2016-05-09 ENCOUNTER — Ambulatory Visit
Admission: RE | Admit: 2016-05-09 | Discharge: 2016-05-09 | Disposition: A | Discharge status: Home / Self Care | Payer: Federal, State, Local not specified - PPO | Source: Ambulatory Visit | Attending: Radiation Oncology | Admitting: Radiation Oncology

## 2016-05-09 ENCOUNTER — Encounter: Payer: Self-pay | Admitting: Radiation Oncology

## 2016-05-09 VITALS — BP 120/88 | HR 78 | Temp 98.9°F | Ht 64.57 in | Wt 246.1 lb

## 2016-05-09 DIAGNOSIS — C50412 Malignant neoplasm of upper-outer quadrant of left female breast: Secondary | ICD-10-CM

## 2016-05-09 DIAGNOSIS — Z510 Encounter for antineoplastic radiation therapy: Secondary | ICD-10-CM | POA: Diagnosis not present

## 2016-05-09 MED ORDER — ALRA NON-METALLIC DEODORANT (RAD-ONC)
1.0000 "application " | Freq: Once | TOPICAL | Status: AC
  Administered 2016-05-09: 1 via TOPICAL

## 2016-05-09 MED ORDER — SONAFINE EX EMUL
1.0000 "application " | Freq: Two times a day (BID) | CUTANEOUS | Status: DC
  Administered 2016-05-09: 1 via TOPICAL

## 2016-05-09 NOTE — Progress Notes (Signed)
Department of Radiation Oncology  Phone:  [phone] Fax:        [phone]  Weekly Treatment Note    Name: Cindy Bradshaw Date: 05/09/2016 MRN: [id number] DOB: 12/09/1970   Diagnosis:     ICD-9-CM ICD-10-CM   1. Malignant neoplasm of upper-outer quadrant of left female breast (HCC) 174.4 [id number] non-metallic deodorant (ALRA) 1 application     DISCONTINUED: SONAFINE emulsion 1 application  2. Breast cancer of upper-outer quadrant of left female breast ([place]) 174.4 C50.412      Current dose: 58.4 Gy  Current fraction:32   MEDICATIONS: Current Outpatient Prescriptions  Medication Sig Dispense Refill  . albuterol (PROVENTIL HFA;VENTOLIN HFA) 108 (90 Base) MCG/ACT inhaler Inhale 2 puffs into the lungs every 4 (four) hours as needed for wheezing or shortness of breath.    . diphenhydrAMINE (BENADRYL) 25 mg capsule Take 1 capsule (25 mg total) by mouth every 6 (six) hours as needed for itching. 30 capsule 0  . escitalopram (LEXAPRO) 20 MG tablet Take 20 mg by mouth at bedtime.    . fexofenadine (ALLEGRA) 180 MG tablet Take 180 mg by mouth 2 (two) times daily.    Marland Kitchen gabapentin (NEURONTIN) 300 MG capsule Take 3 capsules (900 mg total) by mouth 3 (three) times daily. 180 capsule 2  . hydrOXYzine (VISTARIL) 25 MG capsule Take 1 capsule (25 mg total) by mouth at bedtime. 30 capsule 5  . ibuprofen (ADVIL,MOTRIN) 200 MG tablet Take 1 tablet by mouth every 8 (eight) hours as needed for mild pain. Reported on [id number]    . non-metallic deodorant Jethro Poling) MISC Apply 1 application topically daily as needed.    . ranitidine (ZANTAC) 150 MG tablet Take 150 mg by mouth 2 (two) times daily.    Marland Kitchen triamcinolone ointment (KENALOG) 0.1 % APPLY 1 APPLICATION TOPICALLY 2 (TWO) TIMES DAILY AS NEEDED (FOR ECZEMA.).  1  . Wound Dressings (SONAFINE) Apply 1 application topically 3 (three) times daily.    . methocarbamol (ROBAXIN) 500 MG tablet Take 1 tablet (500 mg total) by mouth every 8 (eight) hours as  needed for muscle spasms. (Patient not taking: Reported on 05/02/2016) 30 tablet 0  . ondansetron (ZOFRAN ODT) 4 MG disintegrating tablet Take 1 tablet (4 mg total) by mouth every 8 (eight) hours as needed for nausea or vomiting. (Patient not taking: Reported on 04/23/2016) 20 tablet 0  . oxyCODONE (OXY IR/ROXICODONE) 5 MG immediate release tablet Take 1-2 tablets (5-10 mg total) by mouth every 4 (four) hours as needed for moderate pain or severe pain. (Patient not taking: Reported on 04/23/2016) 50 tablet 0   No current facility-administered medications for this encounter.    Facility-Administered Medications Ordered in Other Encounters  Medication Dose Route Frequency Provider Last Rate Last Dose  . 0.9 %  sodium chloride infusion   Intravenous Once Laurie Panda, NP         ALLERGIES: Tape; Dilaudid [hydromorphone hcl]; Ivp dye [iodinated diagnostic agents]; Percocet [oxycodone-acetaminophen]; and Toradol [ketorolac tromethamine]   LABORATORY DATA:  Lab Results  Component Value Date   WBC 3.7 (L) 04/12/2016   HGB 11.8 (L) 04/12/2016   HCT 37.1 04/12/2016   MCV 90.7 04/12/2016   PLT 367 04/12/2016   Lab Results  Component Value Date   NA 139 04/12/2016   K 3.6 04/12/2016   CL 108 04/12/2016   CO2 26 04/12/2016   Lab Results  Component Value Date   ALT 56 (H) 04/12/2016   AST 47 (H)  04/12/2016   ALKPHOS 60 04/12/2016   BILITOT 0.5 04/12/2016     NARRATIVE: Cindy Bradshaw was seen today for weekly treatment management. The chart was checked and the patient's films were reviewed.  Cindy Bradshaw has received 32 fractions to her left breast.  She will complete treatment on 05/10/16.  Given appointment card for FU on 06/12/16 at 1:45pm.  Note dry desquamation in the left lower axillary and the left inframmary fold.  No drainage noted and she is applying neosporin per Dr. Michelene Heady' order.  Continues to have intermittent shooting pains in her left breast.  BP 120/88 (BP Location: Right  Arm, Patient Position: Sitting, Cuff Size: Large)   Pulse 78   Temp 98.9 F (37.2 C) (Oral)   Ht 5' 4.57" (1.64 m)   Wt 246 lb 1.6 oz (111.6 kg)   BMI 41.50 kg/m     Wt Readings from Last 3 Encounters:  05/09/16 246 lb 1.6 oz (111.6 kg)  05/02/16 247 lb 3.2 oz (112.1 kg)  04/27/16 246 lb 8 oz (111.8 kg)      PHYSICAL EXAMINATION: height is 5' 4.57" (1.64 m) and weight is 246 lb 1.6 oz (111.6 kg). Her oral temperature is 98.9 F (37.2 C). Her blood pressure is 120/88 and her pulse is 78.        ASSESSMENT: The patient is doing satisfactorily with treatment.  PLAN: We will continue with the patient's radiation treatment as planned. The patient will finish her final fraction of radiation treatment tomorrow and then he will return to our clinic in 1 month for routine follow-up.

## 2016-05-09 NOTE — Progress Notes (Signed)
Complex simulation note  Diagnosis: Left-sided breast cancer  Narrative The patient has initially been planned to receive a course of whole breast radiation to a dose of 50.4 Gy in 28 fractions. The patient will now receive an additional boost to the seroma cavity which has been contoured. This will correspond to a boost of 10 Gy at 2 Gy per fraction. To accomplish this, an additional 3 customized blocks have been designed for this purpose. A complex isodose plan is requested to ensure that the target area is adequately covered with radiation dose and that the nearby normal structures such as the lung are adequately spared. The patient's final total dose will be 60.4 Gy.  ------------------------------------------------  Veer Elamin S. Larico Dimock, MD, PhD   

## 2016-05-09 NOTE — Progress Notes (Addendum)
Cindy Bradshaw has received 32 fractions to her left breast.  She will complete treatment on 05/10/16.  Given appointment card for FU on 06/12/16 at 1:45pm.  Note dry desquamation in the left lower axillary and the left inframmary fold.  No drainage noted and she is applying neosporin per Dr. Michelene Heady' order.  Continues to have intermittent shooting pains in her left breast.  BP 120/88 (BP Location: Right Arm, Patient Position: Sitting, Cuff Size: Large)   Pulse 78   Temp 98.9 F (37.2 C) (Oral)   Ht 5' 4.57" (1.64 m)   Wt 246 lb 1.6 oz (111.6 kg)   BMI 41.50 kg/m     Wt Readings from Last 3 Encounters:  05/09/16 246 lb 1.6 oz (111.6 kg)  05/02/16 247 lb 3.2 oz (112.1 kg)  04/27/16 246 lb 8 oz (111.8 kg)

## 2016-05-10 ENCOUNTER — Encounter: Payer: Self-pay | Admitting: Radiation Oncology

## 2016-05-10 ENCOUNTER — Telehealth: Payer: Self-pay | Admitting: *Deleted

## 2016-05-10 ENCOUNTER — Ambulatory Visit
Admission: RE | Admit: 2016-05-10 | Discharge: 2016-05-10 | Disposition: A | Discharge status: Home / Self Care | Payer: Federal, State, Local not specified - PPO | Source: Ambulatory Visit | Attending: Radiation Oncology | Admitting: Radiation Oncology

## 2016-05-10 DIAGNOSIS — C50412 Malignant neoplasm of upper-outer quadrant of left female breast: Secondary | ICD-10-CM

## 2016-05-10 DIAGNOSIS — Z510 Encounter for antineoplastic radiation therapy: Secondary | ICD-10-CM | POA: Diagnosis not present

## 2016-05-10 NOTE — Telephone Encounter (Signed)
  Oncology Nurse Navigator Documentation    Navigator Encounter Type: Telephone (05/10/16 1400) Telephone: [place] Call (05/10/16 1400)         Patient Visit Type: RadOnc (05/10/16 1400) Treatment Phase: Final Radiation Tx (05/10/16 1400)     Interventions: Referrals (05/10/16 1400) Referrals: Survivorship (05/10/16 1400)                    Time Spent with Patient: 15 (05/10/16 1400)

## 2016-05-18 NOTE — Progress Notes (Signed)
  Radiation Oncology         [phone] ________________________________  Name: [name]. [place] MRN: [id number]  Date: 05/10/2016  DOB: 03/31/1971  End of Treatment Note  Diagnosis:   Left-sided breast cancer     Indication for treatment:  Curative       Radiation treatment dates:   03/26/2016 through 05/10/2016  Site/dose:   The patient initially received a dose of 50.4 Gy in 28 fractions to the breast using whole-breast tangent fields. This was delivered using a 3-D conformal technique. The patient then received a boost to the seroma. This delivered an additional 10 Gy in 5 fractions using a 3 field photon boost technique. The total dose was 60.4 Gy.  Narrative: The patient tolerated radiation treatment relatively well.   The patient had some expected skin irritation as she progressed during treatment. Moist desquamation was not present at the end of treatment.  Plan: The patient has completed radiation treatment. The patient will return to radiation oncology clinic for routine followup in one month. I advised the patient to call or return sooner if they have any questions or concerns related to their recovery or treatment. ________________________________  Jodelle Gross, M.D., Ph.D.

## 2016-06-05 NOTE — Assessment & Plan Note (Signed)
Left lumpectomy 02/13/2016: IDC grade 3, 3.3 cm, margins negative, 0/3 lymph nodes negative, triple negative, T2 N0 stage II a pathologic stage  Left breast biopsy 08/17/2015:1:30 position: Invasive ductal carcinoma, grade 3, ER 0%, PR 0%, HER-2 negative ratio 1.48, Ki-67 90%, 3.1 cm tumor, axilla negative, T2 N0 stage II a clinical stage Breast biopsies x 2: 09/28/2015 : [place]  Treatment summary: 1. Completed Cycle 4 dose dense Adriamycin and Cytoxan on 10/31/15; completed cycle 3 Taxol and carboplatin, completed 7 cycles of Taxol and treatment discontinued for poor tolerance. 2. left lumpectomy 02/13/2016  3. Adjuvant radiation 03/26/2016 to 05/14/2016  Treatment plan: 1. Adjuvant Xeloda 2 weeks on and one-week off 6 months followed by 2. Immunotherapy on clinical trial with Pembrolizumab   CREATE-X study enrolled 910 patient's with triple negative breast cancer and residual disease after neo-adjuvant therapy. 455 patients assigned to 1250 mg/m times daily 2 weeks on 1 week off  up to 8 cycles, at 5 years PFS 74.1% with Xeloda compared to 67.7% in control arm, 30% reduction in risk, overall survival 89.2% versus 83.9%, hand-foot syndrome was seen in 72.3% with Xeloda grade 3 in 10.9%

## 2016-06-07 ENCOUNTER — Ambulatory Visit (HOSPITAL_BASED_OUTPATIENT_CLINIC_OR_DEPARTMENT_OTHER): Payer: Federal, State, Local not specified - PPO | Admitting: Hematology and Oncology

## 2016-06-07 ENCOUNTER — Encounter: Payer: Self-pay | Admitting: *Deleted

## 2016-06-07 ENCOUNTER — Encounter: Payer: Self-pay | Admitting: Hematology and Oncology

## 2016-06-07 VITALS — BP 121/77 | HR 87 | Temp 98.7°F | Resp 18 | Ht 64.5 in | Wt 243.7 lb

## 2016-06-07 DIAGNOSIS — Z170 Estrogen receptor positive status [ER+]: Secondary | ICD-10-CM

## 2016-06-07 DIAGNOSIS — G629 Polyneuropathy, unspecified: Secondary | ICD-10-CM

## 2016-06-07 DIAGNOSIS — C50412 Malignant neoplasm of upper-outer quadrant of left female breast: Secondary | ICD-10-CM | POA: Diagnosis not present

## 2016-06-07 DIAGNOSIS — G609 Hereditary and idiopathic neuropathy, unspecified: Secondary | ICD-10-CM

## 2016-06-07 DIAGNOSIS — Z171 Estrogen receptor negative status [ER-]: Secondary | ICD-10-CM

## 2016-06-07 DIAGNOSIS — T451X5A Adverse effect of antineoplastic and immunosuppressive drugs, initial encounter: Secondary | ICD-10-CM

## 2016-06-07 DIAGNOSIS — G620 Drug-induced polyneuropathy: Secondary | ICD-10-CM

## 2016-06-07 MED ORDER — OXYCODONE-ACETAMINOPHEN 5-325 MG PO TABS: 1.0000 | ORAL_TABLET | Freq: Three times a day (TID) | ORAL | 0 refills | Status: DC | PRN

## 2016-06-07 NOTE — Progress Notes (Signed)
Patient Care Team: [name], MD (Inactive) as PCP - General (Internal Medicine)  SUMMARY OF ONCOLOGIC HISTORY:   Breast cancer of upper-outer quadrant of left female breast ([place])   08/17/2015 Initial Diagnosis    left breast biopsy 1:30 position: Invasive ductal carcinoma, grade 3, ER 0%, PR 0%, HER-2 negative ratio 1.48, Ki-67 90%, 3.1 cm tumor, axilla negative, T2 N0 stage II a clinical stage      09/19/2015 - 01/10/2016 Neo-Adjuvant Chemotherapy    Neoadjuvant chemotherapy with dose dense Adriamycin and Cytoxan 4 followed by Taxol and carboplatin weekly 6      09/28/2015 Procedure     Left breast biopsy anterior third left upper outer quadrant: [facility];  upper inner quadrant: [facility]      01/16/2016 Breast MRI    significant response to chemotherapy with decrease in the tumor from 5.1 cm to 2.9 cm, also decrease a non-mass enhancement      02/13/2016 Surgery    Left lumpectomy: IDC grade 3, 3.3 cm, margins negative, 0/3 lymph nodes negative, triple negative, T2 N0 stage II a pathologic stage      03/26/2016 - 05/14/2016 Radiation Therapy    Adjuvant radiation therapy with Dr. Lisbeth Renshaw       CHIEF COMPLIANT: Follow-up after radiation therapy, complains of neuropathy in the hands  INTERVAL HISTORY: Cindy Bradshaw is a 45 year old with above-mentioned history of triple negative left breast cancer who received neoadjuvant chemotherapy and still had residual disease on the lumpectomy. She completed adjuvant radiation and is here today to discuss a treatment plan. Her major complaints of related to neuropathy in the hands as well as fatigue. She has pain and discomfort in the fingers. She has been using gabapentin 900 mg 3 times a day. In spite of that her symptoms have been persistent. She is requesting pain medications to help ease the discomfort in her fingers.  REVIEW OF SYSTEMS:   Constitutional: Denies fevers, chills or abnormal weight loss Eyes: Denies blurriness of  vision Ears, nose, mouth, throat, and face: Denies mucositis or sore throat Respiratory: Denies cough, dyspnea or wheezes Cardiovascular: Denies palpitation, chest discomfort Gastrointestinal:  Denies nausea, heartburn or change in bowel habits Skin: Denies abnormal skin rashes Lymphatics: Denies new lymphadenopathy or easy bruising Neurological: Neuropathy in the hands Behavioral/Psych: Mood is stable, no new changes  Extremities: No lower extremity edema All other systems were reviewed with the patient and are negative.  I have reviewed the past medical history, past surgical history, social history and family history with the patient and they are unchanged from previous note.  ALLERGIES:  is allergic to tape; dilaudid [hydromorphone hcl]; ivp dye [iodinated diagnostic agents]; percocet [oxycodone-acetaminophen]; and toradol [ketorolac tromethamine].  MEDICATIONS:  Current Outpatient Prescriptions  Medication Sig Dispense Refill  . albuterol (PROVENTIL HFA;VENTOLIN HFA) 108 (90 Base) MCG/ACT inhaler Inhale 2 puffs into the lungs every 4 (four) hours as needed for wheezing or shortness of breath.    . diphenhydrAMINE (BENADRYL) 25 mg capsule Take 1 capsule (25 mg total) by mouth every 6 (six) hours as needed for itching. 30 capsule 0  . escitalopram (LEXAPRO) 20 MG tablet Take 20 mg by mouth at bedtime.    . fexofenadine (ALLEGRA) 180 MG tablet Take 180 mg by mouth 2 (two) times daily.    Marland Kitchen gabapentin (NEURONTIN) 300 MG capsule Take 3 capsules (900 mg total) by mouth 3 (three) times daily. 180 capsule 2  . hydrOXYzine (VISTARIL) 25 MG capsule Take 1 capsule (25 mg  total) by mouth at bedtime. 30 capsule 5  . ibuprofen (ADVIL,MOTRIN) 200 MG tablet Take 1 tablet by mouth every 8 (eight) hours as needed for mild pain. Reported on 03/12/2016    . methocarbamol (ROBAXIN) 500 MG tablet Take 1 tablet (500 mg total) by mouth every 8 (eight) hours as needed for muscle spasms. (Patient not taking:  Reported on 05/02/2016) 30 tablet 0  . non-metallic deodorant (ALRA) MISC Apply 1 application topically daily as needed.    . ondansetron (ZOFRAN ODT) 4 MG disintegrating tablet Take 1 tablet (4 mg total) by mouth every 8 (eight) hours as needed for nausea or vomiting. (Patient not taking: Reported on 04/23/2016) 20 tablet 0  . oxyCODONE-acetaminophen (PERCOCET/ROXICET) 5-325 MG tablet Take 1 tablet by mouth every 8 (eight) hours as needed for severe pain. 30 tablet 0  . ranitidine (ZANTAC) 150 MG tablet Take 150 mg by mouth 2 (two) times daily.    Marland Kitchen triamcinolone ointment (KENALOG) 0.1 % APPLY 1 APPLICATION TOPICALLY 2 (TWO) TIMES DAILY AS NEEDED (FOR ECZEMA.).  1  . Wound Dressings (SONAFINE) Apply 1 application topically 3 (three) times daily.     No current facility-administered medications for this visit.    Facility-Administered Medications Ordered in Other Visits  Medication Dose Route Frequency Provider Last Rate Last Dose  . 0.9 %  sodium chloride infusion   Intravenous Once Laurie Panda, NP        PHYSICAL EXAMINATION: ECOG PERFORMANCE STATUS: 1 - Symptomatic but completely ambulatory  Vitals:   06/07/16 1131  BP: 121/77  Pulse: 87  Resp: 18  Temp: 98.7 F (37.1 C)   Filed Weights   06/07/16 1131  Weight: 243 lb 11.2 oz (110.5 kg)    GENERAL:alert, no distress and comfortable SKIN: skin color, texture, turgor are normal, no rashes or significant lesions EYES: normal, Conjunctiva are pink and non-injected, sclera clear OROPHARYNX:no exudate, no erythema and lips, buccal mucosa, and tongue normal  NECK: supple, thyroid normal size, non-tender, without nodularity LYMPH:  no palpable lymphadenopathy in the cervical, axillary or inguinal LUNGS: clear to auscultation and percussion with normal breathing effort HEART: regular rate & rhythm and no murmurs and no lower extremity edema ABDOMEN:abdomen soft, non-tender and normal bowel sounds MUSCULOSKELETAL:no cyanosis of  digits and no clubbing  NEURO: alert & oriented x 3 with fluent speech, Neuropathy in the fingers grade 2-3 EXTREMITIES: No lower extremity edema  LABORATORY DATA:  I have reviewed the data as listed   Chemistry      Component Value Date/Time   NA 139 04/12/2016 0048   NA 140 04/10/2016 1224   K 3.6 04/12/2016 0048   K 3.4 (L) 04/10/2016 1224   CL 108 04/12/2016 0048   CO2 26 04/12/2016 0048   CO2 27 04/10/2016 1224   BUN 12 04/12/2016 0048   BUN 10.0 04/10/2016 1224   CREATININE 0.79 04/12/2016 0048   CREATININE 0.8 04/10/2016 1224      Component Value Date/Time   CALCIUM 9.8 04/12/2016 0048   CALCIUM 10.1 04/10/2016 1224   ALKPHOS 60 04/12/2016 0048   ALKPHOS 56 01/10/2016 1142   AST 47 (H) 04/12/2016 0048   AST 35 (H) 01/10/2016 1142   ALT 56 (H) 04/12/2016 0048   ALT 57 (H) 01/10/2016 1142   BILITOT 0.5 04/12/2016 0048   BILITOT 0.48 01/10/2016 1142       Lab Results  Component Value Date   WBC 3.7 (L) 04/12/2016   HGB 11.8 (L) 04/12/2016  HCT 37.1 04/12/2016   MCV 90.7 04/12/2016   PLT 367 04/12/2016   NEUTROABS 2.4 04/10/2016     ASSESSMENT & PLAN:  Breast cancer of upper-outer quadrant of left female breast ([place]) Left lumpectomy 02/13/2016: IDC grade 3, 3.3 cm, margins negative, 0/3 lymph nodes negative, triple negative, T2 N0 stage II a pathologic stage  Left breast biopsy 08/17/2015:1:30 position: Invasive ductal carcinoma, grade 3, ER 0%, PR 0%, HER-2 negative ratio 1.48, Ki-67 90%, 3.1 cm tumor, axilla negative, T2 N0 stage II a clinical stage Breast biopsies x 2: 09/28/2015 : [place]  Treatment summary: 1. Completed Cycle 4 dose dense Adriamycin and Cytoxan on 10/31/15; completed cycle 3 Taxol and carboplatin, completed 7 cycles of Taxol and treatment discontinued for poor tolerance. 2. left lumpectomy 02/13/2016  3. Adjuvant radiation 03/26/2016 to 05/14/2016  Treatment plan: 1. I discussed the risks and benefits of Xeloda and because of her  neuropathy in the hands, we elected not to pursue with Xeloda because it could increase the risk of hand-foot syndrome. 2. Immunotherapy on clinical trial with Pembrolizumab  will be presented to her and she will make a decision whether she will want to participate in this clinical trial. The clinical trial involves randomization to immunotherapy with Pembrolizumab versus observation for 1 year.  Return to clinic based upon clinical trial participation.  No orders of the defined types were placed in this encounter.  The patient has a good understanding of the overall plan. she agrees with it. she will call with any problems that may develop before the next visit here.   Rulon Eisenmenger, MD 06/07/16

## 2016-06-08 ENCOUNTER — Ambulatory Visit: Payer: Federal, State, Local not specified - PPO | Admitting: Physical Therapy

## 2016-06-12 ENCOUNTER — Ambulatory Visit
Admission: RE | Admit: 2016-06-12 | Discharge: 2016-06-12 | Disposition: A | Discharge status: Home / Self Care | Payer: Federal, State, Local not specified - PPO | Source: Ambulatory Visit | Attending: Radiation Oncology | Admitting: Radiation Oncology

## 2016-06-12 ENCOUNTER — Encounter: Payer: Self-pay | Admitting: Radiation Oncology

## 2016-06-12 VITALS — BP 120/80 | HR 90 | Temp 99.3°F | Ht 64.5 in | Wt 245.2 lb

## 2016-06-12 DIAGNOSIS — C50412 Malignant neoplasm of upper-outer quadrant of left female breast: Secondary | ICD-10-CM

## 2016-06-12 DIAGNOSIS — Z171 Estrogen receptor negative status [ER-]: Secondary | ICD-10-CM

## 2016-06-12 DIAGNOSIS — C50912 Malignant neoplasm of unspecified site of left female breast: Secondary | ICD-10-CM | POA: Insufficient documentation

## 2016-06-12 DIAGNOSIS — Z79899 Other long term (current) drug therapy: Secondary | ICD-10-CM | POA: Insufficient documentation

## 2016-06-12 DIAGNOSIS — G629 Polyneuropathy, unspecified: Secondary | ICD-10-CM | POA: Insufficient documentation

## 2016-06-12 NOTE — Progress Notes (Signed)
Radiation Oncology         [phone] ________________________________  Name: Cindy Bradshaw MRN: [id number]  Date: 06/12/2016  DOB: [birth year]  Post Treatment Note  CC: [name], MD  Nicholas Lose, MD  Diagnosis:  Stage IIA, T2,N0 Triple negative invasive ductal carcinoma of the left breast.  Interval Since Last Radiation:  5 weeks   03/26/2016 through 05/10/2016: The patient initially received a dose of 50.4 Gy in 28 fractions to the breast using whole-breast tangent fields. This was delivered using a 3-D conformal technique. The patient then received a boost to the seroma. This delivered an additional 10 Gy in 5 fractions using a 3 field photon boost technique. The total dose was 60.4 Gy.   Narrative:  The patient returns today for routine follow-up.  She tolerated radiotherapy well without disruption in her treatment plan. She has struggled with neuropathy in her fingers since chemotherapy and has noticed some progression. She will meet with PT tomorrow regarding this and is taking Gabapentin.                           On review of systems, the patient states her fingertips are severely uncomfortable and she has switched over to using plastic utensils because of this pain. 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 She denies skin concerns and reports she is using radioplex. No other complaints are noted.  ALLERGIES:  is allergic to tape; dilaudid [hydromorphone hcl]; ivp dye [iodinated diagnostic agents]; percocet [oxycodone-acetaminophen]; and toradol [ketorolac tromethamine].  Meds: Current Outpatient Prescriptions  Medication Sig Dispense Refill  . albuterol (PROVENTIL HFA;VENTOLIN HFA) 108 (90 Base) MCG/ACT inhaler Inhale 2 puffs into the lungs every 4 (four) hours as needed for wheezing or shortness of breath.    . diphenhydrAMINE (BENADRYL) 25 mg capsule Take 1 capsule (25 mg total) by mouth every  6 (six) hours as needed for itching. 30 capsule 0  . escitalopram (LEXAPRO) 20 MG tablet Take 20 mg by mouth at bedtime.    . fexofenadine (ALLEGRA) 180 MG tablet Take 180 mg by mouth 2 (two) times daily.    Marland Kitchen gabapentin (NEURONTIN) 300 MG capsule Take 3 capsules (900 mg total) by mouth 3 (three) times daily. 180 capsule 2  . hydrOXYzine (VISTARIL) 25 MG capsule Take 1 capsule (25 mg total) by mouth at bedtime. 30 capsule 5  . ibuprofen (ADVIL,MOTRIN) 200 MG tablet Take 1 tablet by mouth every 8 (eight) hours as needed for mild pain. Reported on 03/12/2016    . methocarbamol (ROBAXIN) 500 MG tablet Take 1 tablet (500 mg total) by mouth every 8 (eight) hours as needed for muscle spasms. (Patient not taking: Reported on 05/02/2016) 30 tablet 0  . non-metallic deodorant (ALRA) MISC Apply 1 application topically daily as needed.    . ondansetron (ZOFRAN ODT) 4 MG disintegrating tablet Take 1 tablet (4 mg total) by mouth every 8 (eight) hours as needed for nausea or vomiting. (Patient not taking: Reported on 04/23/2016) 20 tablet 0  . oxyCODONE-acetaminophen (PERCOCET/ROXICET) 5-325 MG tablet Take 1 tablet by mouth every 8 (eight) hours as needed for severe pain. 30 tablet 0  . ranitidine (ZANTAC) 150 MG tablet Take 150 mg by mouth 2 (two) times daily.    Marland Kitchen triamcinolone ointment (KENALOG) 0.1 % APPLY 1 APPLICATION TOPICALLY 2 (TWO) TIMES DAILY AS NEEDED (FOR ECZEMA.).  1  . Wound Dressings (  SONAFINE) Apply 1 application topically 3 (three) times daily.     No current facility-administered medications for this encounter.    Facility-Administered Medications Ordered in Other Encounters  Medication Dose Route Frequency Provider Last Rate Last Dose  . 0.9 %  sodium chloride infusion   Intravenous Once Laurie Panda, NP        Physical Findings:  height is 5' 4.5" (1.638 m) and weight is 245 lb 3.2 oz (111.2 kg). Her oral temperature is 99.3 F (37.4 C). Her blood pressure is 120/80 and her pulse is  90.  In general this is a well appearing African American female in no acute distress. She's alert and oriented x4 and appropriate throughout the examination. Cardiopulmonary assessment is negative for acute distress and she exhibits normal effort. The left breast is intact without desquamation or skin breakdown. Her fingertips are intact, no lesions are seen. She does have hyperpigmentation of her fingernails.  Lab Findings: Lab Results  Component Value Date   WBC 3.7 (L) 04/12/2016   HGB 11.8 (L) 04/12/2016   HCT 37.1 04/12/2016   MCV 90.7 04/12/2016   PLT 367 04/12/2016     Radiographic Findings: No results found.  Impression/Plan: 1. Stage IIA, T2,N0 Triple negative invasive ductal carcinoma of the left breast. The patient was not a candidate for clinical trial with immunotherapy due to the discontinuation of her chemotherapy. She will continue in surveillance with Dr. Lindi Adie. We will be available as needed for the patient moving forward. 2. Suvivorship. The patient will follow up with Mike Craze, NP in survivorship clinic as well.  3. Peripheral neuropathy. The patient will be seen by PT tomorrow and we appreciate the input of their service. The patient also understands the role of neurology in further medical management, and alternative therapies including acupuncture.      Carola Rhine, PAC

## 2016-06-12 NOTE — Progress Notes (Signed)
Ms. Cindy Bradshaw here for reassessment S/P XRT to her left breast.  She denies any pain in her breast and note hyperpigmentation with mild dryness with intact inframmary region.  She reports intermittent aching and "shooting pains in her breast.  She also reports fatigue and napping during the day.  Peripheral neuropathy of hands and demonstrating discomfort at this time.  No HRT - ER negative   Survivorship Encounter scheduled for 07/19/16  BP 120/80   Pulse 90   Temp 99.3 F (37.4 C) (Oral)   Ht 5' 4.5" (1.638 m)   Wt 245 lb 3.2 oz (111.2 kg)   BMI 41.44 kg/m    Wt Readings from Last 3 Encounters:  06/12/16 245 lb 3.2 oz (111.2 kg)  06/07/16 243 lb 11.2 oz (110.5 kg)  05/09/16 246 lb 1.6 oz (111.6 kg)

## 2016-06-12 NOTE — Addendum Note (Signed)
Encounter addended by: Benn Moulder, RN on: 06/12/2016  5:41 PM<BR>    Actions taken: Charge Capture section accepted

## 2016-06-13 ENCOUNTER — Ambulatory Visit: Payer: Federal, State, Local not specified - PPO | Attending: Hematology and Oncology | Admitting: Physical Therapy

## 2016-06-13 DIAGNOSIS — R293 Abnormal posture: Secondary | ICD-10-CM | POA: Insufficient documentation

## 2016-06-13 DIAGNOSIS — M79641 Pain in right hand: Secondary | ICD-10-CM | POA: Insufficient documentation

## 2016-06-13 DIAGNOSIS — M79642 Pain in left hand: Secondary | ICD-10-CM | POA: Insufficient documentation

## 2016-06-13 DIAGNOSIS — M6281 Muscle weakness (generalized): Secondary | ICD-10-CM | POA: Diagnosis present

## 2016-06-13 NOTE — Therapy (Signed)
[place], [place], [address] Phone: [phone]   Fax:  [phone]  Physical Therapy Evaluation  Patient Details  Name: Cindy Bradshaw. [place] MRN: [id number] Date of Birth: 02-Aug-1971 Referring Provider: Dr. Lindi Adie   Encounter Date: 06/13/2016      PT End of Session - 06/13/16 1206    Visit Number 1   Number of Visits 13   Date for PT Re-Evaluation 07/25/16   PT Start Time 1110   PT Stop Time 1155   PT Time Calculation (min) 45 min   Activity Tolerance Patient tolerated treatment well   Behavior During Therapy [facility] for tasks assessed/performed      Past Medical History:  Diagnosis Date  . Arthritis   . Asthma   . Back disorder    degenerative disk disease  . Breast cancer ([place])   . Cancer ([place])   . GERD (gastroesophageal reflux disease)   . Mammogram abnormal 08/24/15   first  . Migraine   . Pap smear for cervical cancer screening 08/12/15    Past Surgical History:  Procedure Laterality Date  . BREAST LUMPECTOMY WITH RADIOACTIVE SEED AND SENTINEL LYMPH NODE BIOPSY Left 02/13/2016   Procedure: BREAST LUMPECTOMY WITH RADIOACTIVE SEED AND SENTINEL LYMPH NODE BIOPSY;  Surgeon: Excell Seltzer, MD;  Location: [place];  Service: General;  Laterality: Left;  . BREAST REDUCTION SURGERY Bilateral 02/21/2016   Procedure: MAMMARY REDUCTION  (BREAST)/Oncoplastic breast reconstruction;  Surgeon: Irene Limbo, MD;  Location: [place];  Service: Plastics;  Laterality: Bilateral;  . CARDIAC CATHETERIZATION [name] 01/24/2016   Procedure: Left Heart Cath and Coronary Angiography;  Surgeon: Leonie Man, MD;  Location: [facility];  Service: Cardiovascular;  Laterality: [name];  . CESAREAN SECTION    . CHOLECYSTECTOMY    . PERIPHERAL VASCULAR CATHETERIZATION Right 02/13/2016   Procedure: PORTA CATH REMOVAL;  Surgeon: Excell Seltzer, MD;  Location: [place];  Service: General;  Laterality: Right;  . PORTACATH PLACEMENT [name] 09/12/2015   Procedure: INSERTION PORT-A-CATH;  Surgeon: Excell Seltzer, MD;  [facility];  Service: General;  Laterality: [name];    There were no vitals filed for this visit.       Subjective Assessment - 06/13/16 1113    Subjective Pt comes in with pain in hand that started with chemotherapy and so chemo was stopped, ( around july or august 2017 ) but the pain has continued to get worse.  It has now stabalized. Pt states she did not have any symptoms of numbness or tingling in her feet.    Pertinent History left breast cancer with lumpectomy and bilateral breast reduction on February 21, 2016 with 3 lymph nodes. She had chemotherapy before surgery and had radiation that stopped May 10 2016.  Has history of neck arthritis and they thought she had carpal tunnel at one time, but it has not been treated    Patient Stated Goals Pt wants to have pain, pins and needles stop in her hands.    Currently in Pain? Yes   Pain Score 8    Pain Location Hand   Pain Orientation Right;Left   Pain Descriptors / Indicators Pins and needles  likes its asleep    Pain Type Chronic pain   Pain Onset More than a month ago   Pain Frequency Constant   Aggravating Factors  cold air makes it worse    Pain Relieving Factors hot water  Effect of Pain on Daily Activities interferes with work on the computer, had to take pain medicine.             [facility] PT Assessment - 06/13/16 0001      Assessment   Medical Diagnosis left breast cancer    Referring Provider Dr. Lindi Adie    Onset Date/Surgical Date 02/21/16   Hand Dominance Right     Precautions   Precaution Comments previous chemo and radiation      Restrictions   Weight Bearing Restrictions No     Balance Screen   Has the patient fallen in the past 6 months No   Has the patient had a decrease in activity level because of a fear of falling?  No   Is the patient  reluctant to leave their home because of a fear of falling?  No     Home Ecologist residence   Living Arrangements Spouse/significant other   Available Help at Discharge Available PRN/intermittently   Type of [place]     Prior Function   Level of Independence Independent   Vocation Full time employment   Vocation Requirements sits at [employer] all day    Leisure not doing any walking, likes to go out to eat on the weekends      Cognition   Overall Cognitive Status Within Functional Limits for tasks assessed     Observation/Other Assessments   Observations Pt very "figidity" during eval.  She reports she has itching from excema that came along with the cancer    Skin Integrity no open areas, but dry skin on arms small with spots on skin that are itchy.  She also has them on her back , sides and legs.    Quick DASH  72.73     Sensation   Additional Comments pt reports numbness in hands      Coordination   Gross Motor Movements are Fluid and Coordinated --  pt has slowness of movement      Posture/Postural Control   Posture/Postural Control Postural limitations   Postural Limitations Rounded Shoulders;Forward head;Decreased thoracic kyphosis     AROM   Overall AROM Comments decreased scapular stabalization on left side compared to right    Right/Left Shoulder --  measured in supine    Right Shoulder Flexion 175 Degrees   Right Shoulder ABduction 155 Degrees  painful    Right Shoulder Internal Rotation 65 Degrees   Right Shoulder External Rotation 85 Degrees   Left Shoulder Flexion 175 Degrees   Left Shoulder ABduction 155 Degrees  painful    Left Shoulder Internal Rotation 65 Degrees   Left Shoulder External Rotation 5 Degrees     Strength   Overall Strength Comments weak and painful shoulder movement, with slow speed with trying to raise arms    Right Shoulder Flexion 3-/5   Right Shoulder ABduction 3-/5   Left Shoulder Flexion 2+/5    Left Shoulder ABduction 2+/5   Right Hand Grip (lbs) 20/5/10   Left Hand Grip (lbs) 20/15/22     Palpation   Palpation comment pt reports she is not having any fullness in her breasts or areas of decreased scar movement            LYMPHEDEMA/ONCOLOGY QUESTIONNAIRE - 06/13/16 1143      Right Upper Extremity Lymphedema   15 cm Proximal to Olecranon Process 38 cm   10 cm Proximal to Olecranon Process 36.5 cm   Olecranon  Process 31 cm   15 cm Proximal to Ulnar Styloid Process 31 cm   10 cm Proximal to Ulnar Styloid Process 27.5 cm   Just Proximal to Ulnar Styloid Process 18 cm   Across Hand at [employer] 21 cm   At [place] of 2nd Digit 6.8 cm     Left Upper Extremity Lymphedema   15 cm Proximal to Olecranon Process 39.5 cm   10 cm Proximal to Olecranon Process 38 cm   Olecranon Process 31 cm   15 cm Proximal to Ulnar Styloid Process 31 cm   10 cm Proximal to Ulnar Styloid Process 26.3 cm   Just Proximal to Ulnar Styloid Process 17.8 cm   Across Hand at [employer] 20 cm   At [place] of 2nd Digit 6.5 cm           Quick Dash - 06/13/16 0001    Open a tight or new jar No difficulty   Do heavy household chores (wash walls, wash floors) Unable   Carry a shopping bag or briefcase Unable   Wash your back Unable   Use a knife to cut food Mild difficulty   Recreational activities in which you take some force or impact through your arm, shoulder, or hand (golf, hammering, tennis) Severe difficulty   During the past week, to what extent has your arm, shoulder or hand problem interfered with your normal social activities with family, friends, neighbors, or groups? Quite a bit   During the past week, to what extent has your arm, shoulder or hand problem limited your work or other regular daily activities Quite a bit   Arm, shoulder, or hand pain. Severe   Tingling (pins and needles) in your arm, shoulder, or hand Extreme   Difficulty Sleeping Severe difficulty   DASH Score 72.73  %                        Short Term Clinic Goals - 06/13/16 1223      CC Short Term Goal  #1   Title Patient reports that pain in hands has decreased to 5/10 so that she can function better at her job    Baseline 8/10   Time 6   Period Weeks   Status New     CC Short Term Goal  #2   Title Pt will be independent in a basic exercise program    Time 3   Period Weeks   Status New     CC Short Term Goal  #3   Title --   Baseline --   Time --   Period Weeks   Status New             Long Term Clinic Goals - 06/13/16 1222      CC Long Term Goal  #1   Title Patient reports that pain in hands has decreased to 6/10 so that she can function better at her job    Baseline 8/10   Time 3   Period Weeks   Status New     CC Long Term Goal  #2   Title pt is independent in an advanced  home exercise program that she says she can continue at home    Time 6   Period Weeks   Status New     CC Long Term Goal  #3   Title Pt will decrease Quick DASH score to 50 demonstrating an improvment in shoulder  function    Baseline 72.73   Time 6   Period Weeks   Status New            Plan - 06/13/16 1207    Clinical Impression Statement Pt s/p treament for breast cancer with chief complaint in hands from chemotherapy who also has abnormal posture, scapular assymetry with arm elevation and decreased shoulder strength and function. Left arm is smaller than right, but suspect this is due to non dominant side and disuse She has very dry skin and appears extremely uncomfortable.  Hopeful she will have some improvement in hand pain with shoulder strength improvment, but will also work locally with hand massage and heat to right hand only ( secondary to some lymph node removal on left side) and possibley electric stim or TNS trial. Will continue to monitor dryness of skin.     Rehab Potential Good   Clinical Impairments Affecting Rehab Potential previous chemo and radiation  ,lymph node removal from left side    PT Frequency 2x / week   PT Duration 6 weeks   PT Treatment/Interventions ADLs/Self Care Home Management;Patient/family education;Electrical Stimulation;Therapeutic exercise;DME Instruction;Contrast Bath;Manual techniques;Therapeutic activities;Moist Heat   PT Next Visit Plan neck and scapular range of motion in sitting, supine dowel rod with progression to supine scapular, sitting and standing exercise all while monitioring and modifiying for hand pain, AROM to hands and progress, use moist pack to right hand only, massage to hands.    Consulted and Agree with Plan of Care Patient      Patient will benefit from skilled therapeutic intervention in order to improve the following deficits and impairments:  Decreased skin integrity, Impaired sensation, Obesity, Decreased knowledge of precautions, Decreased activity tolerance, Decreased knowledge of use of DME, Decreased strength, Impaired UE functional use, Pain, Postural dysfunction  Visit Diagnosis: Pain in left hand - Plan: PT plan of care cert/re-cert  Abnormal posture - Plan: PT plan of care cert/re-cert  Pain in right hand - Plan: PT plan of care cert/re-cert  Muscle weakness (generalized) - Plan: PT plan of care cert/re-cert     Problem List Patient Active Problem List   Diagnosis Date Noted  . Allergic reaction   . Pruritus 04/23/2016  . Chest pain   . Diaphoresis   . Lightheadedness   . Pain in the chest   . SOB (shortness of breath)   . Abnormal nuclear stress test   . Unstable angina ([place]) 01/21/2016  . Right ankle pain 01/21/2016  . Nausea with vomiting 01/04/2016  . Adnexal cyst: Right per CT 12/09/2015 12/12/2015  . Asthma 12/09/2015  . Abdominal pain 12/09/2015  . Chemotherapy-induced peripheral neuropathy ([place]) 12/05/2015  . Genetic testing 10/13/2015  . Dehydration 09/25/2015  . Family history of breast cancer in female 09/20/2015  . Normocytic normochromic anemia  09/19/2015  . Breast cancer of upper-outer quadrant of left female breast ([place]) 09/07/2015   Donato Heinz. Owens Shark PT  [place] 06/13/2016, 12:31 PM  [place], [place], [address] Phone: [phone]   Fax:  [phone]  Name: Kashmere Przywara. Brien MRN: [id number] Date of Birth: July 13, 1971

## 2016-06-14 ENCOUNTER — Ambulatory Visit: Payer: Federal, State, Local not specified - PPO | Admitting: Physical Therapy

## 2016-06-14 DIAGNOSIS — R293 Abnormal posture: Secondary | ICD-10-CM

## 2016-06-14 DIAGNOSIS — M79642 Pain in left hand: Secondary | ICD-10-CM

## 2016-06-14 DIAGNOSIS — M6281 Muscle weakness (generalized): Secondary | ICD-10-CM

## 2016-06-14 DIAGNOSIS — M79641 Pain in right hand: Secondary | ICD-10-CM

## 2016-06-14 NOTE — Therapy (Signed)
[place], [place], [address] Phone: [phone]   Fax:  [phone]  Physical Therapy Treatment  Patient Details  Name: Cindy Bradshaw. [place] MRN: [id number] Date of Birth: Jan 13, 1971 Referring Provider: Dr. Lindi Adie   Encounter Date: 06/14/2016      PT End of Session - 06/14/16 1202    Visit Number 2   Number of Visits 13   Date for PT Re-Evaluation 07/25/16   PT Start Time 1110   PT Stop Time 1155   PT Time Calculation (min) 45 min   Activity Tolerance Patient tolerated treatment well   Behavior During Therapy [facility] for tasks assessed/performed      Past Medical History:  Diagnosis Date  . Arthritis   . Asthma   . Back disorder    degenerative disk disease  . Breast cancer ([place])   . Cancer ([place])   . GERD (gastroesophageal reflux disease)   . Mammogram abnormal 08/24/15   first  . Migraine   . Pap smear for cervical cancer screening 08/12/15    Past Surgical History:  Procedure Laterality Date  . BREAST LUMPECTOMY WITH RADIOACTIVE SEED AND SENTINEL LYMPH NODE BIOPSY Left 02/13/2016   Procedure: BREAST LUMPECTOMY WITH RADIOACTIVE SEED AND SENTINEL LYMPH NODE BIOPSY;  Surgeon: Excell Seltzer, MD;  Location: [place];  Service: General;  Laterality: Left;  . BREAST REDUCTION SURGERY Bilateral 02/21/2016   Procedure: MAMMARY REDUCTION  (BREAST)/Oncoplastic breast reconstruction;  Surgeon: Irene Limbo, MD;  Location: [place];  Service: Plastics;  Laterality: Bilateral;  . CARDIAC CATHETERIZATION [name] 01/24/2016   Procedure: Left Heart Cath and Coronary Angiography;  Surgeon: Leonie Man, MD;  Location: [facility];  Service: Cardiovascular;  Laterality: [name];  . CESAREAN SECTION    . CHOLECYSTECTOMY    . PERIPHERAL VASCULAR CATHETERIZATION Right 02/13/2016   Procedure: PORTA CATH REMOVAL;  Surgeon: Excell Seltzer, MD;  Location: [place];  Service: General;  Laterality: Right;  . PORTACATH PLACEMENT [name] 09/12/2015   Procedure: INSERTION PORT-A-CATH;  Surgeon: Excell Seltzer, MD;  [facility];  Service: General;  Laterality: [name];    There were no vitals filed for this visit.      Subjective Assessment - 06/14/16 1111    Subjective pt hurrying today,  she has to change her clothes because she couldn't get the button fastened on her shirt.  she dropped a cup yesterday because she couldn't hold it.    Pertinent History left breast cancer with lumpectomy and bilateral breast reduction on February 21, 2016 with 3 lymph nodes. She had chemotherapy before surgery and had radiation that stopped May 10 2016.  Has history of neck arthritis and they thought she had carpal tunnel at one time, but it has not been treated    Currently in Pain? Yes   Pain Score 8   numbness is about a 10   Pain Orientation Right;Left               LYMPHEDEMA/ONCOLOGY QUESTIONNAIRE - 06/13/16 1143      Right Upper Extremity Lymphedema   15 cm Proximal to Olecranon Process 38 cm   10 cm Proximal to Olecranon Process 36.5 cm   Olecranon Process 31 cm   15 cm Proximal to Ulnar Styloid Process 31 cm   10 cm Proximal to Ulnar Styloid Process 27.5 cm   Just Proximal to Ulnar Styloid Process 18 cm   Across Hand at Thumb  Web Space 21 cm   At [place] of 2nd Digit 6.8 cm     Left Upper Extremity Lymphedema   15 cm Proximal to Olecranon Process 39.5 cm   10 cm Proximal to Olecranon Process 38 cm   Olecranon Process 31 cm   15 cm Proximal to Ulnar Styloid Process 31 cm   10 cm Proximal to Ulnar Styloid Process 26.3 cm   Just Proximal to Ulnar Styloid Process 17.8 cm   Across Hand at [employer] 20 cm   At [place] of 2nd Digit 6.5 cm                  [facility] Adult PT Treatment/Exercise - 06/14/16 0001      Elbow Exercises   Elbow Flexion AROM;Right;Left;5 reps   Forearm Supination AROM;Right;Left;5 reps   Forearm Pronation  AROM;Right;Left;5 reps     Neck Exercises: Seated   Cervical Rotation Other reps (comment)  2 reps    Lateral Flexion Both;5 reps   Shoulder Rolls Backwards;Forwards;5 reps     Shoulder Exercises: Supine   Protraction AROM;AAROM;Both;5 reps  needed assist for left arm    Flexion AROM;Right;Left;5 reps   Other Supine Exercises dowel rod flexion with both arms x 5 reps      Hand Exercises   MCPJ Flexion AROM;Both;5 reps   MCPJ Extension AROM;Both;5 reps   Opposition AROM;Both;5 reps   Thumb Opposition both hands x 5 reps    Digit Abduction/Adduction all digits x 5 reps    Desensitization with massage and terry towel to right hand    Other Hand Exercises full hand flexion and extension x 5 reps      Moist Heat Therapy   Number Minutes Moist Heat --  10   Moist Heat Location Hand  right only.  pt will pain relief while heat applied.      Manual Therapy   Manual Therapy Soft tissue mobilization   Manual therapy comments pt had increased pain deep pressure to right palm .   Soft tissue mobilization with biotone with both hands                 PT Education - 06/14/16 1201    Education provided Yes   Education Details active shoulder range of motion 5 repetitions, 3 times a day    Person(s) Educated Patient   Methods Explanation;Demonstration;Tactile cues;Verbal cues   Comprehension Verbalized understanding;Returned demonstration;Need further instruction           Short Term Clinic Goals - 06/13/16 1223      CC Short Term Goal  #1   Title Patient reports that pain in hands has decreased to 5/10 so that she can function better at her job    Baseline 8/10   Time 6   Period Weeks   Status New     CC Short Term Goal  #2   Title Pt will be independent in a basic exercise program    Time 3   Period Weeks   Status New     CC Short Term Goal  #3   Title --   Baseline --   Time --   Period Weeks   Status New             Long Term Clinic Goals -  06/13/16 1222      CC Long Term Goal  #1   Title Patient reports that pain in hands has decreased to 6/10 so that she can  function better at her job    Baseline 8/10   Time 3   Period Weeks   Status New     CC Long Term Goal  #2   Title pt is independent in an advanced  home exercise program that she says she can continue at home    Time 6   Period Weeks   Status New     CC Long Term Goal  #3   Title Pt will decrease Quick DASH score to 50 demonstrating an improvment in shoulder function    Baseline 72.73   Time 6   Period Weeks   Status New            Plan - 06/14/16 1202    Clinical Impression Statement Pt continues with pain in hands that progressed to shoulders and neck after lying supine with one pillow and head of mat slightly elevated.  Pt thinks she may have had the pain from lying too low.  He pain dissipated after she was up in sitting and moved around for a few minutes.  She will see how she feels later on today after this treatment and we will need to adjust postion / treatments according to her response.  She did feel some relief in right hand with moist pack. encouraged pt to do range of  motion with shoulders at home    Rehab Potential Good   Clinical Impairments Affecting Rehab Potential previous chemo and radiation ,lymph node removal from left side so cannot use hot pack on left hand    PT Frequency 2x / week   PT Duration 6 weeks   PT Treatment/Interventions ADLs/Self Care Home Management;Patient/family education;Electrical Stimulation;Therapeutic exercise;DME Instruction;Contrast Bath;Manual techniques;Therapeutic activities;Moist Heat   PT Next Visit Plan  assess effects of last treatment  Try all treatment in sitting this sessin to see if she tolerates it with less neck and shoulder pain.  Hot pack to right hand , massage to both hands, range of motion to shoulders, elbows and hands    Consulted and Agree with Plan of Care Patient      Patient will  benefit from skilled therapeutic intervention in order to improve the following deficits and impairments:  Decreased skin integrity, Impaired sensation, Obesity, Decreased knowledge of precautions, Decreased activity tolerance, Decreased knowledge of use of DME, Decreased strength, Impaired UE functional use, Pain, Postural dysfunction  Visit Diagnosis: Pain in left hand  Abnormal posture  Pain in right hand  Muscle weakness (generalized)     Problem List Patient Active Problem List   Diagnosis Date Noted  . Allergic reaction   . Pruritus 04/23/2016  . Chest pain   . Diaphoresis   . Lightheadedness   . Pain in the chest   . SOB (shortness of breath)   . Abnormal nuclear stress test   . Unstable angina ([place]) 01/21/2016  . Right ankle pain 01/21/2016  . Nausea with vomiting 01/04/2016  . Adnexal cyst: Right per CT 12/09/2015 12/12/2015  . Asthma 12/09/2015  . Abdominal pain 12/09/2015  . Chemotherapy-induced peripheral neuropathy ([place]) 12/05/2015  . Genetic testing 10/13/2015  . Dehydration 09/25/2015  . Family history of breast cancer in female 09/20/2015  . Normocytic normochromic anemia 09/19/2015  . Breast cancer of upper-outer quadrant of left female breast ([place]) 09/07/2015   Donato Heinz. Owens Shark PT  [place] Levo 06/14/2016, 12:09 PM  [place], [place], [address] Phone: [phone]   Fax:  [phone]  Name: Jewelissa Shoulders. Sel[place] MRN: [id number] Date of Birth: 1971-02-26

## 2016-06-19 ENCOUNTER — Ambulatory Visit: Payer: Federal, State, Local not specified - PPO | Admitting: Physical Therapy

## 2016-06-19 DIAGNOSIS — M79642 Pain in left hand: Secondary | ICD-10-CM | POA: Diagnosis not present

## 2016-06-19 DIAGNOSIS — R293 Abnormal posture: Secondary | ICD-10-CM

## 2016-06-19 DIAGNOSIS — M6281 Muscle weakness (generalized): Secondary | ICD-10-CM

## 2016-06-19 DIAGNOSIS — M79641 Pain in right hand: Secondary | ICD-10-CM

## 2016-06-19 NOTE — Therapy (Signed)
[place], [place], [address] Phone: [phone]   Fax:  [phone]  Physical Therapy Treatment  Patient Details  Name: Cindy Bradshaw. [place] MRN: [id number] Date of Birth: 08-09-1971 Referring Provider: Dr. Lindi Adie   Encounter Date: 06/19/2016      PT End of Session - 06/19/16 1200    Visit Number 3   Number of Visits 13   Date for PT Re-Evaluation 07/25/16   PT Start Time 1100   PT Stop Time 1145   PT Time Calculation (min) 45 min   Activity Tolerance Patient tolerated treatment well   Behavior During Therapy [facility] for tasks assessed/performed      Past Medical History:  Diagnosis Date  . Arthritis   . Asthma   . Back disorder    degenerative disk disease  . Breast cancer ([place])   . Cancer ([place])   . GERD (gastroesophageal reflux disease)   . Mammogram abnormal 08/24/15   first  . Migraine   . Pap smear for cervical cancer screening 08/12/15    Past Surgical History:  Procedure Laterality Date  . BREAST LUMPECTOMY WITH RADIOACTIVE SEED AND SENTINEL LYMPH NODE BIOPSY Left 02/13/2016   Procedure: BREAST LUMPECTOMY WITH RADIOACTIVE SEED AND SENTINEL LYMPH NODE BIOPSY;  Surgeon: Excell Seltzer, MD;  Location: [place];  Service: General;  Laterality: Left;  . BREAST REDUCTION SURGERY Bilateral 02/21/2016   Procedure: MAMMARY REDUCTION  (BREAST)/Oncoplastic breast reconstruction;  Surgeon: Irene Limbo, MD;  Location: [employer];  Service: Plastics;  Laterality: Bilateral;  . CARDIAC CATHETERIZATION [name] 01/24/2016   Procedure: Left Heart Cath and Coronary Angiography;  Surgeon: Leonie Man, MD;  Location: [place] [facility];  Service: Cardiovascular;  Laterality: [name];  . CESAREAN SECTION    . CHOLECYSTECTOMY    . PERIPHERAL VASCULAR CATHETERIZATION Right 02/13/2016   Procedure: PORTA CATH REMOVAL;  Surgeon: Excell Seltzer, MD;  Location: [place];  Service: General;  Laterality: Right;  . PORTACATH PLACEMENT [name] 09/12/2015   Procedure: INSERTION PORT-A-CATH;  Surgeon: Excell Seltzer, MD;  [facility];  Service: General;  Laterality: [name];    There were no vitals filed for this visit.      Subjective Assessment - 06/19/16 1109    Subjective shoulders were more sore after last treatment, but the hands felt a little better thet next day.    Pertinent History left breast cancer with lumpectomy and bilateral breast reduction on February 21, 2016 with 3 lymph nodes. She had chemotherapy before surgery and had radiation that stopped May 10 2016.  Has history of neck arthritis and they thought she had carpal tunnel at one time, but it has not been treated    Patient Stated Goals Pt wants to have pain, pins and needles stop in her hands.    Currently in Pain? Yes   Pain Score 9    Pain Location Hand   Pain Orientation Right;Left   Pain Descriptors / Indicators Throbbing;Shooting   Pain Type Chronic pain   Pain Onset More than a month ago   Multiple Pain Sites Yes   Pain Score 9   Pain Location Breast   Pain Orientation Left   Pain Descriptors / Indicators Throbbing;Shooting   Pain Type Acute pain   Pain Onset Efrain Sella Adult PT Treatment/Exercise -  06/19/16 0001      Neck Exercises: Seated   Other Seated Exercise AROM of neck      Shoulder Exercises: Seated   Other Seated Exercises AROM of shoulders      Hand Exercises   Other Hand Exercises full hand flexion and extension x 5 reps      Wrist Exercises   Wrist Extension AROM;Both;5 reps   Wrist Extension Limitations gentle stretch at end range      Moist Heat Therapy   Number Minutes Moist Heat 10 Minutes   Moist Heat Location Hand  right hand      Manual Therapy   Manual Therapy Soft tissue mobilization   Soft tissue mobilization with biotone with both hands Also provided soft tissue mobilization to both  sides of neck, upper shoulders and interscapular area. with range of motion and followed by moist heat .                 PT Education - 06/19/16 1159    Education provided Yes   Education Details try to wear bra and see if compressin of bra helps with breast pain.    Person(s) Educated Patient   Methods Explanation   Comprehension Verbalized understanding           Short Term Clinic Goals - 06/13/16 1223      CC Short Term Goal  #1   Title Patient reports that pain in hands has decreased to 5/10 so that she can function better at her job    Baseline 8/10   Time 6   Period Weeks   Status New     CC Short Term Goal  #2   Title Pt will be independent in a basic exercise program    Time 3   Period Weeks   Status New     CC Short Term Goal  #3   Title --   Baseline --   Time --   Period Weeks   Status New             Long Term Clinic Goals - 06/13/16 1222      CC Long Term Goal  #1   Title Patient reports that pain in hands has decreased to 6/10 so that she can function better at her job    Baseline 8/10   Time 3   Period Weeks   Status New     CC Long Term Goal  #2   Title pt is independent in an advanced  home exercise program that she says she can continue at home    Time 6   Period Weeks   Status New     CC Long Term Goal  #3   Title Pt will decrease Quick DASH score to 50 demonstrating an improvment in shoulder function    Baseline 72.73   Time 6   Period Weeks   Status New            Plan - 06/19/16 1201    Clinical Impression Statement Pt has continued pain in hands and shoulder and has complaint of breast pain today. She has been not been wearing a bra because she feels it is "too tight" Encouraged pt to try to wear bra for the compression to see if it will help control this pain.  Pt said she felt a little better after her session today    Rehab Potential Good   Clinical Impairments Affecting Rehab Potential previous chemo and  radiation ,lymph node removal from left side so cannot use hot pack on left hand ,  history of capal tunnel syndrome    PT Frequency 2x / week   PT Duration 6 weeks   PT Next Visit Plan  assess effects of last treatment  Continue with treatment in sitting this sessin to see if she tolerates it with less neck and shoulder pain.  Hot pack to right hand , massage to both hands, range of motion to shoulders, elbows and hands Also, soft tissue to work to neck to prepare for shoulder and arm work        Patient will benefit from skilled therapeutic intervention in order to improve the following deficits and impairments:  Decreased skin integrity, Impaired sensation, Obesity, Decreased knowledge of precautions, Decreased activity tolerance, Decreased knowledge of use of DME, Decreased strength, Impaired UE functional use, Pain, Postural dysfunction  Visit Diagnosis: Pain in left hand  Abnormal posture  Pain in right hand  Muscle weakness (generalized)     Problem List Patient Active Problem List   Diagnosis Date Noted  . Allergic reaction   . Pruritus 04/23/2016  . Chest pain   . Diaphoresis   . Lightheadedness   . Pain in the chest   . SOB (shortness of breath)   . Abnormal nuclear stress test   . Unstable angina ([place]) 01/21/2016  . Right ankle pain 01/21/2016  . Nausea with vomiting 01/04/2016  . Adnexal cyst: Right per CT 12/09/2015 12/12/2015  . Asthma 12/09/2015  . Abdominal pain 12/09/2015  . Chemotherapy-induced peripheral neuropathy ([place]) 12/05/2015  . Genetic testing 10/13/2015  . Dehydration 09/25/2015  . Family history of breast cancer in female 09/20/2015  . Normocytic normochromic anemia 09/19/2015  . Breast cancer of upper-outer quadrant of left female breast ([place]) 09/07/2015   Donato Heinz. Owens Shark PT  Norwood Levo 06/19/2016, 12:07 PM  [place], [place], [address] Phone:  [phone]   Fax:  [phone]  Name: Cindy Bradshaw MRN: [id number] Date of Birth: 1970-10-25

## 2016-06-21 ENCOUNTER — Ambulatory Visit: Payer: Federal, State, Local not specified - PPO | Admitting: Physical Therapy

## 2016-06-21 DIAGNOSIS — M79642 Pain in left hand: Secondary | ICD-10-CM

## 2016-06-21 DIAGNOSIS — R293 Abnormal posture: Secondary | ICD-10-CM

## 2016-06-21 DIAGNOSIS — M79641 Pain in right hand: Secondary | ICD-10-CM

## 2016-06-21 DIAGNOSIS — M6281 Muscle weakness (generalized): Secondary | ICD-10-CM

## 2016-06-21 NOTE — Therapy (Signed)
[place], [place], [address] Phone: [phone]   Fax:  [phone]  Physical Therapy Treatment  Patient Details  Name: Cindy Bradshaw. [place] MRN: [id number] Date of Birth: 08/11/1971 Referring Provider: Dr. Lindi Adie   Encounter Date: 06/21/2016      PT End of Session - 06/21/16 1154    Visit Number 4   Number of Visits 13   Date for PT Re-Evaluation 07/25/16   PT Start Time 1100   PT Stop Time 1143   PT Time Calculation (min) 43 min   Activity Tolerance Patient tolerated treatment well   Behavior During Therapy [facility] for tasks assessed/performed      Past Medical History:  Diagnosis Date  . Arthritis   . Asthma   . Back disorder    degenerative disk disease  . Breast cancer ([place])   . Cancer ([place])   . GERD (gastroesophageal reflux disease)   . Mammogram abnormal 08/24/15   first  . Migraine   . Pap smear for cervical cancer screening 08/12/15    Past Surgical History:  Procedure Laterality Date  . BREAST LUMPECTOMY WITH RADIOACTIVE SEED AND SENTINEL LYMPH NODE BIOPSY Left 02/13/2016   Procedure: BREAST LUMPECTOMY WITH RADIOACTIVE SEED AND SENTINEL LYMPH NODE BIOPSY;  Surgeon: Excell Seltzer, MD;  Location: [place];  Service: General;  Laterality: Left;  . BREAST REDUCTION SURGERY Bilateral 02/21/2016   Procedure: MAMMARY REDUCTION  (BREAST)/Oncoplastic breast reconstruction;  Surgeon: Irene Limbo, MD;  Location: [place];  Service: Plastics;  Laterality: Bilateral;  . CARDIAC CATHETERIZATION [name] 01/24/2016   Procedure: Left Heart Cath and Coronary Angiography;  Surgeon: Leonie Man, MD;  Location: [place] [facility];  Service: Cardiovascular;  Laterality: [name];  . CESAREAN SECTION    . CHOLECYSTECTOMY    . PERIPHERAL VASCULAR CATHETERIZATION Right 02/13/2016   Procedure: PORTA CATH REMOVAL;  Surgeon: Excell Seltzer, MD;  Location: [place];  Service: General;  Laterality: Right;  . PORTACATH PLACEMENT [name] 09/12/2015   Procedure: INSERTION PORT-A-CATH;  Surgeon: Excell Seltzer, MD;  [facility];  Service: General;  Laterality: [name];    There were no vitals filed for this visit.      Subjective Assessment - 06/21/16 1109    Subjective neck and shoulders feels better.  She still has some numbness in her hand and fingers   Pertinent History left breast cancer with lumpectomy and bilateral breast reduction on February 21, 2016 with 3 lymph nodes. She had chemotherapy before surgery and had radiation that stopped May 10 2016.  Has history of neck arthritis and they thought she had carpal tunnel at one time, but it has not been treated    Patient Stated Goals Pt wants to have pain, pins and needles stop in her hands.    Currently in Pain? Yes   Pain Score 6    Pain Location Hand   Pain Orientation Left;Right   Pain Descriptors / Indicators Numbness   Pain Type Chronic pain   Pain Onset More than a month ago   Pain Frequency Constant                         [facility] Adult PT Treatment/Exercise - 06/21/16 0001      Neck Exercises: Seated   Other Seated Exercise AROM of neck  and upper thoracic spine      Shoulder Exercises: Standing   External Rotation  AROM;Strengthening;Both;5 reps;Theraband   Theraband Level (Shoulder External Rotation) Level 1 (Yellow)   Flexion Strengthening;Both;5 reps   Flexion Limitations isometric    ABduction Strengthening;Both;5 reps   ABduction Limitations isometric   Extension Right;Both;5 reps   Extension Limitations isometric      Shoulder Exercises: ROM/Strengthening   Other ROM/Strengthening Exercises finger ladder in flrxion 3 times with each arm    Other ROM/Strengthening Exercises hands pressed together, elbows out in "prayer" position.      Manual Therapy   Manual Therapy Soft tissue mobilization   Soft tissue mobilization with biotone with both hands  Also provided soft tissue mobilization to both sides of neck, upper shoulders and interscapular area. with range of motion and followed by moist heat .                    Short Term Clinic Goals - 06/13/16 1223      CC Short Term Goal  #1   Title Patient reports that pain in hands has decreased to 5/10 so that she can function better at her job    Baseline 8/10   Time 6   Period Weeks   Status New     CC Short Term Goal  #2   Title Pt will be independent in a basic exercise program    Time 3   Period Weeks   Status New     CC Short Term Goal  #3   Title --   Baseline --   Time --   Period Weeks   Status New             Long Term Clinic Goals - 06/13/16 1222      CC Long Term Goal  #1   Title Patient reports that pain in hands has decreased to 6/10 so that she can function better at her job    Baseline 8/10   Time 3   Period Weeks   Status New     CC Long Term Goal  #2   Title pt is independent in an advanced  home exercise program that she says she can continue at home    Time 6   Period Weeks   Status New     CC Long Term Goal  #3   Title Pt will decrease Quick DASH score to 50 demonstrating an improvment in shoulder function    Baseline 72.73   Time 6   Period Weeks   Status New            Plan - 06/21/16 1154    Clinical Impression Statement pt had shooting pain in to right hand and left breast after treatment.  She did have sports bra on today.  Pt has good shoulder range of motion and did well with isometric ex.  will be ready to progress to scapular series with theraband next visit.    Clinical Impairments Affecting Rehab Potential previous chemo and radiation ,lymph node removal from left side so cannot use hot pack on left hand ,  history of capal tunnel syndrome    PT Frequency 2x / week   PT Duration 6 weeks   PT Treatment/Interventions ADLs/Self Care Home Management;Patient/family education;Electrical Stimulation;Therapeutic  exercise;DME Instruction;Contrast Bath;Manual techniques;Therapeutic activities;Moist Heat   PT Next Visit Plan  Hot pack to right hand , massage to both hands, range of motion to shoulders, elbows and hands Also, soft tissue to work to neck to prepare for shoulder and arm work  consider manual lymph drainage to left breast if she still has symptoms there. progress to theraband scapular series    Consulted and Agree with Plan of Care Patient      Patient will benefit from skilled therapeutic intervention in order to improve the following deficits and impairments:  Decreased skin integrity, Impaired sensation, Obesity, Decreased knowledge of precautions, Decreased activity tolerance, Decreased knowledge of use of DME, Decreased strength, Impaired UE functional use, Pain, Postural dysfunction  Visit Diagnosis: Pain in left hand  Abnormal posture  Muscle weakness (generalized)  Pain in right hand     Problem List Patient Active Problem List   Diagnosis Date Noted  . Allergic reaction   . Pruritus 04/23/2016  . Chest pain   . Diaphoresis   . Lightheadedness   . Pain in the chest   . SOB (shortness of breath)   . Abnormal nuclear stress test   . Unstable angina ([place]) 01/21/2016  . Right ankle pain 01/21/2016  . Nausea with vomiting 01/04/2016  . Adnexal cyst: Right per CT 12/09/2015 12/12/2015  . Asthma 12/09/2015  . Abdominal pain 12/09/2015  . Chemotherapy-induced peripheral neuropathy ([place]) 12/05/2015  . Genetic testing 10/13/2015  . Dehydration 09/25/2015  . Family history of breast cancer in female 09/20/2015  . Normocytic normochromic anemia 09/19/2015  . Breast cancer of upper-outer quadrant of left female breast ([place]) 09/07/2015   Donato Heinz. Owens Shark PT  Norwood Levo 06/21/2016, 12:03 PM  [place], [place], [address] Phone: [phone]   Fax:  [phone]  Name: Khyrie Doleman.  [place] MRN: [id number] Date of Birth: [birth year]

## 2016-06-26 ENCOUNTER — Ambulatory Visit: Payer: Federal, State, Local not specified - PPO | Admitting: Physical Therapy

## 2016-06-26 DIAGNOSIS — M79642 Pain in left hand: Secondary | ICD-10-CM | POA: Diagnosis not present

## 2016-06-26 DIAGNOSIS — M6281 Muscle weakness (generalized): Secondary | ICD-10-CM

## 2016-06-26 DIAGNOSIS — M79641 Pain in right hand: Secondary | ICD-10-CM

## 2016-06-26 DIAGNOSIS — R293 Abnormal posture: Secondary | ICD-10-CM

## 2016-06-26 NOTE — Patient Instructions (Signed)
Over Head Pull: Narrow Grip        On back, knees bent, feet flat, band across thighs, elbows straight but relaxed. Pull hands apart (start). Keeping elbows straight, bring arms up and over head, hands toward floor. Keep pull steady on band. Hold momentarily. Return slowly, keeping pull steady, back to start. Repeat 5-10 __ times. Band color ___yellow_   Side Pull: Double Arm   On back, knees bent, feet flat. Arms perpendicular to body, shoulder level, elbows straight but relaxed. Pull arms out to sides, elbows straight. Resistance band comes across collarbones, hands toward floor. Hold momentarily. Slowly return to starting position. Repeat5-10__ times. Band color yellow ___   Sash   On back, knees bent, feet flat, left hand on left hip, right hand above left. Pull right arm DIAGONALLY (hip to shoulder) across chest. Bring right arm along head toward floor. Hold momentarily. Slowly return to starting position. Repeat _5-10 __ times. Do with left arm. Band color _yellow _____   Shoulder Rotation: Double Arm   On back, knees bent, feet flat, elbows tucked at sides, bent 90, hands palms up. Pull hands apart and down toward floor, keeping elbows near sides. Hold momentarily. Slowly return to starting position. Repeat _5-10 __ times. Band color __yellow ____

## 2016-06-26 NOTE — Therapy (Signed)
[place], [place], [address] Phone: [phone]   Fax:  [phone]  Physical Therapy Treatment  Patient Details  Name: Cindy Bradshaw. Cindy Bradshaw MRN: [id number] Date of Birth: [birth year] Referring Provider: Dr. Lindi Adie   Encounter Date: 06/26/2016      PT End of Session - 06/26/16 1627    Visit Number 5   Number of Visits 13   Date for PT Re-Evaluation 07/25/16   PT Start Time 1500  pt arrived very late   PT Stop Time 1515   PT Time Calculation (min) 15 min   Activity Tolerance Patient tolerated treatment well   Behavior During Therapy [facility] for tasks assessed/performed      Past Medical History:  Diagnosis Date  . Arthritis   . Asthma   . Back disorder    degenerative disk disease  . Breast cancer ([place])   . Cancer ([place])   . GERD (gastroesophageal reflux disease)   . Mammogram abnormal 08/24/15   first  . Migraine   . Pap smear for cervical cancer screening 08/12/15    Past Surgical History:  Procedure Laterality Date  . BREAST LUMPECTOMY WITH RADIOACTIVE SEED AND SENTINEL LYMPH NODE BIOPSY Left 02/13/2016   Procedure: BREAST LUMPECTOMY WITH RADIOACTIVE SEED AND SENTINEL LYMPH NODE BIOPSY;  Surgeon: Excell Seltzer, MD;  Location: [place];  Service: General;  Laterality: Left;  . BREAST REDUCTION SURGERY Bilateral 02/21/2016   Procedure: MAMMARY REDUCTION  (BREAST)/Oncoplastic breast reconstruction;  Surgeon: Irene Limbo, MD;  Location: [place];  Service: Plastics;  Laterality: Bilateral;  . CARDIAC CATHETERIZATION [name] 01/24/2016   Procedure: Left Heart Cath and Coronary Angiography;  Surgeon: Leonie Man, MD;  Location: [place] [facility];  Service: Cardiovascular;  Laterality: [name];  . CESAREAN SECTION    . CHOLECYSTECTOMY    . PERIPHERAL VASCULAR CATHETERIZATION Right 02/13/2016   Procedure: PORTA CATH REMOVAL;  Surgeon: Excell Seltzer, MD;  Location:  [place];  Service: General;  Laterality: Right;  . PORTACATH PLACEMENT [name] 09/12/2015   Procedure: INSERTION PORT-A-CATH;  Surgeon: Excell Seltzer, MD;  [facility];  Service: General;  Laterality: [name];    There were no vitals filed for this visit.      Subjective Assessment - 06/26/16 1501    Subjective pt mixed up today,  She came earlier at wrong appt time and now is 30 minutes late as she had someone coming to her home for home repair and they were late. But, overall she says she feels better and looks better. she is ready to progress exercises    Pertinent History left breast cancer with lumpectomy and bilateral breast reduction on February 21, 2016 with 3 lymph nodes. She had chemotherapy before surgery and had radiation that stopped May 10 2016.  Has history of neck arthritis and they thought she had carpal tunnel at one time, but it has not been treated    Patient Stated Goals Pt wants to have pain, pins and needles stop in her hands.    Currently in Pain? Yes   Pain Score 6    Pain Location Hand   Pain Orientation Right;Left   Multiple Pain Sites Yes   Pain Score 3   Pain Location Breast   Pain Orientation Left                         [facility] Adult PT Treatment/Exercise - 06/26/16 0001  Shoulder Exercises: Supine   External Rotation Strengthening;Both;5 reps   Theraband Level (Shoulder External Rotation) Level 1 (Yellow)   Flexion Strengthening;5 reps  wide and narrow grip    Theraband Level (Shoulder Flexion) Level 1 (Yellow)     Moist Heat Therapy   Number Minutes Moist Heat 10 Minutes   Moist Heat Location Hand  right hand      Manual Therapy   Soft tissue mobilization with biotone with both hands Also provided soft tissue mobilization to both sides of neck, upper shoulders and interscapular area. with range of motion and followed by moist heat .                    Short Term Clinic Goals - 06/26/16 1632       CC Short Term Goal  #1   Title Patient reports that pain in hands has decreased to 6/10 so that she can function better at her job    Baseline 8/10   Status Achieved     CC Short Term Goal  #2   Title Pt will be independent in a basic exercise program    Status On-going             [place] - 06/26/16 1632      CC Long Term Goal  #1   Title Patient reports that pain in hands has decreased to 5/10 so that she can function better at her job    Status On-going     CC Long Term Goal  #2   Title pt is independent in an advanced  home exercise program that she says she can continue at home    Status On-going     CC Long Term Goal  #3   Title Pt will decrease Quick DASH score to 50 demonstrating an improvment in shoulder function    Status On-going            Plan - 06/26/16 1628    Clinical Impression Statement Pt is improving overall (short term goal for hand pain met)  and was able to progress to supine scapular series with yellow theraband. she also had brief work on her hands. Treatment up till now has been focused on hand pain and beginning shoulder work, now ready to treat breast lymphedema if still a problem and progress shoulder strength    Clinical Impairments Affecting Rehab Potential previous chemo and radiation ,lymph node removal from left side so cannot use hot pack on left hand ,  history of capal tunnel syndrome    PT Frequency 2x / week   PT Duration 3 weeks   PT Treatment/Interventions ADLs/Self Care Home Management;Patient/family education;Electrical Stimulation;Therapeutic exercise;DME Instruction;Contrast Bath;Manual techniques;Therapeutic activities;Moist Heat   PT Next Visit Plan Measure  left upper arm and see if she had had reduction.  Check for symptoms in left breast.  If she has symptoms, do manual lymph drainage to left chest and upper arm.  Review and porgress scapular exercisel  If hands are major complaint, use  Hot pack to right  hand , massage to both hands, range of motion to shoulders, elbows and hands   Consulted and Agree with Plan of Care Patient      Patient will benefit from skilled therapeutic intervention in order to improve the following deficits and impairments:  Decreased skin integrity, Impaired sensation, Obesity, Decreased knowledge of precautions, Decreased activity tolerance, Decreased knowledge of use of DME, Decreased strength, Impaired UE functional use,  Pain, Postural dysfunction  Visit Diagnosis: Pain in left hand  Abnormal posture  Muscle weakness (generalized)  Pain in right hand     Problem List Patient Active Problem List   Diagnosis Date Noted  . Allergic reaction   . Pruritus 04/23/2016  . Chest pain   . Diaphoresis   . Lightheadedness   . Pain in the chest   . SOB (shortness of breath)   . Abnormal nuclear stress test   . Unstable angina ([place]) 01/21/2016  . Right ankle pain 01/21/2016  . Nausea with vomiting 01/04/2016  . Adnexal cyst: Right per CT 12/09/2015 12/12/2015  . Asthma 12/09/2015  . Abdominal pain 12/09/2015  . Chemotherapy-induced peripheral neuropathy ([place]) 12/05/2015  . Genetic testing 10/13/2015  . Dehydration 09/25/2015  . Family history of breast cancer in female 09/20/2015  . Normocytic normochromic anemia 09/19/2015  . Breast cancer of upper-outer quadrant of left female breast ([place]) 09/07/2015   Donato Heinz. Owens Shark PT  Norwood Levo 06/26/2016, 4:35 PM  [place], [place], [address] Phone: [phone]   Fax:  [phone]  Name: Cindy Bradshaw. [place] MRN: [id number] Date of Birth: 11-16-1970

## 2016-06-28 ENCOUNTER — Ambulatory Visit: Payer: Federal, State, Local not specified - PPO | Admitting: Physical Therapy

## 2016-06-28 DIAGNOSIS — M6281 Muscle weakness (generalized): Secondary | ICD-10-CM

## 2016-06-28 DIAGNOSIS — M79641 Pain in right hand: Secondary | ICD-10-CM

## 2016-06-28 DIAGNOSIS — R293 Abnormal posture: Secondary | ICD-10-CM

## 2016-06-28 DIAGNOSIS — M79642 Pain in left hand: Secondary | ICD-10-CM

## 2016-06-28 NOTE — Therapy (Signed)
[place], [place], [address] Phone: [phone]   Fax:  [phone]  Physical Therapy Treatment  Patient Details  Name: Cindy Bradshaw MRN: [id number] Date of Birth: 03/17/1971 Referring Provider: Dr. Lindi Adie   Encounter Date: 06/28/2016      PT End of Session - 06/28/16 1153    Visit Number 6   Number of Visits 13   Date for PT Re-Evaluation 07/25/16   PT Start Time 1119   PT Stop Time 1153   PT Time Calculation (min) 34 min   Activity Tolerance Patient tolerated treatment well   Behavior During Therapy Flat affect;WFL for tasks assessed/performed      Past Medical History:  Diagnosis Date  . Arthritis   . Asthma   . Back disorder    degenerative disk disease  . Breast cancer ([place])   . Cancer ([place])   . GERD (gastroesophageal reflux disease)   . Mammogram abnormal 08/24/15   first  . Migraine   . Pap smear for cervical cancer screening 08/12/15    Past Surgical History:  Procedure Laterality Date  . BREAST LUMPECTOMY WITH RADIOACTIVE SEED AND SENTINEL LYMPH NODE BIOPSY Left 02/13/2016   Procedure: BREAST LUMPECTOMY WITH RADIOACTIVE SEED AND SENTINEL LYMPH NODE BIOPSY;  Surgeon: Excell Seltzer, MD;  Location: [facility];  Service: General;  Laterality: Left;  . BREAST REDUCTION SURGERY Bilateral 02/21/2016   Procedure: MAMMARY REDUCTION  (BREAST)/Oncoplastic breast reconstruction;  Surgeon: Irene Limbo, MD;  Location: [facility];  Service: Plastics;  Laterality: Bilateral;  . CARDIAC CATHETERIZATION [name] 01/24/2016   Procedure: Left Heart Cath and Coronary Angiography;  Surgeon: Leonie Man, MD;  Location: [facility];  Service: Cardiovascular;  Laterality: [name];  . CESAREAN SECTION    . CHOLECYSTECTOMY    . PERIPHERAL VASCULAR CATHETERIZATION Right 02/13/2016   Procedure: PORTA CATH REMOVAL;  Surgeon: Excell Seltzer, MD;  Location: [facility];  Service: General;  Laterality: Right;  . PORTACATH PLACEMENT [name] 09/12/2015   Procedure: INSERTION PORT-A-CATH;  Surgeon: Excell Seltzer, MD;  [facility];  Service: General;  Laterality: [name];    There were no vitals filed for this visit.      Subjective Assessment - 06/28/16 1121    Subjective I feel like I'm better than when we started. My hands continue to be tingling and numb and it gets worse on the computer at work.   Pertinent History left breast cancer with lumpectomy and bilateral breast reduction on February 21, 2016 with 3 lymph nodes. She had chemotherapy before surgery and had radiation that stopped May 10 2016.  Has history of neck arthritis and they thought she had carpal tunnel at one time, but it has not been treated    Patient Stated Goals Pt wants to have pain, pins and needles stop in her hands.    Currently in Pain? Yes   Pain Score 5    Pain Location Hand   Pain Orientation Right;Left   Pain Descriptors / Indicators Pins and needles;Numbness   Pain Type Neuropathic pain   Pain Onset More than a month ago   Pain Frequency Constant               LYMPHEDEMA/ONCOLOGY QUESTIONNAIRE - 06/28/16 1127      Left Upper Extremity Lymphedema   10 cm Proximal to Olecranon Process 36.6 cm   Olecranon Process 29.4 cm   15 cm Proximal to Ulnar  Styloid Process 29.4 cm   10 cm Proximal to Ulnar Styloid Process 25.5 cm   Just Proximal to Ulnar Styloid Process 17.3 cm   Across Hand at [employer] 20.1 cm   At [place] of 2nd Digit 6.4 cm                  [facility] Adult PT Treatment/Exercise - 06/28/16 0001      Moist Heat Therapy   Number Minutes Moist Heat 10 Minutes   Moist Heat Location Hand  Right hand only while working on left     Manual Therapy   Manual Therapy Soft tissue mobilization   Soft tissue mobilization with biotone with both hands Also provided soft tissue mobilization to both sides of neck, upper shoulders and  interscapular area. with range of motion and followed by moist heat .                    Short Term Clinic Goals - 06/26/16 1632      CC Short Term Goal  #1   Title Patient reports that pain in hands has decreased to 6/10 so that she can function better at her job    Baseline 8/10   Status Achieved     CC Short Term Goal  #2   Title Pt will be independent in a basic exercise program    Status On-going             Holiday - 06/26/16 1632      CC Long Term Goal  #1   Title Patient reports that pain in hands has decreased to 5/10 so that she can function better at her job    Status On-going     CC Long Term Goal  #2   Title pt is independent in an advanced  home exercise program that she says she can continue at home    Status On-going     CC Long Term Goal  #3   Title Pt will decrease Quick DASH score to 50 demonstrating an improvment in shoulder function    Status On-going            Plan - 06/28/16 1153    Clinical Impression Statement Difficulty reading patient initially to determine appropriate plan for treatment and she was late.  She requested heat to her right hand and soft tissue work to hands and neck as she reported this helped the most manage her symptoms.  Left UE measurements indicated improvement in her swelling so she was happy with that.  Discussed at length the [facility] and [facility] as she complained about fatigue and not getting back to all activities.  She is fairly sedentary and would benefit from a structured exercise program.  Issed the [employer] brochure but did not have the [facility] brochure available.   Rehab Potential Good   Clinical Impairments Affecting Rehab Potential previous chemo and radiation ,lymph node removal from left side so cannot use hot pack on left hand ,  history of capal tunnel syndrome    PT Frequency 2x / week   PT Duration 3 weeks   PT Treatment/Interventions ADLs/Self Care Home  Management;Patient/family education;Electrical Stimulation;Therapeutic exercise;DME Instruction;Contrast Bath;Manual techniques;Therapeutic activities;Moist Heat   PT Next Visit Plan Consider TENS trail for hands CIPN and continue manual treatment for hands.   Consulted and Agree with Plan of Care Patient      Patient will benefit from skilled therapeutic intervention in order  to improve the following deficits and impairments:  Decreased skin integrity, Impaired sensation, Obesity, Decreased knowledge of precautions, Decreased activity tolerance, Decreased knowledge of use of DME, Decreased strength, Impaired UE functional use, Pain, Postural dysfunction  Visit Diagnosis: Pain in left hand  Abnormal posture  Muscle weakness (generalized)  Pain in right hand     Problem List Patient Active Problem List   Diagnosis Date Noted  . Allergic reaction   . Pruritus 04/23/2016  . Chest pain   . Diaphoresis   . Lightheadedness   . Pain in the chest   . SOB (shortness of breath)   . Abnormal nuclear stress test   . Unstable angina ([place]) 01/21/2016  . Right ankle pain 01/21/2016  . Nausea with vomiting 01/04/2016  . Adnexal cyst: Right per CT 12/09/2015 12/12/2015  . Asthma 12/09/2015  . Abdominal pain 12/09/2015  . Chemotherapy-induced peripheral neuropathy ([place]) 12/05/2015  . Genetic testing 10/13/2015  . Dehydration 09/25/2015  . Family history of breast cancer in female 09/20/2015  . Normocytic normochromic anemia 09/19/2015  . Breast cancer of upper-outer quadrant of left female breast ([place]) 09/07/2015   Annia Friendly, PT 06/28/16 11:58 AM  [place], [place], [address] Phone: [phone]   Fax:  [phone]  Name: Cindy Bradshaw MRN: [id number] Date of Birth: 1971-08-18

## 2016-07-02 ENCOUNTER — Emergency Department (HOSPITAL_COMMUNITY)
Admission: EM | Admit: 2016-07-02 | Discharge: 2016-07-03 | Disposition: A | Discharge status: Home / Self Care | Payer: Federal, State, Local not specified - PPO | Attending: Emergency Medicine | Admitting: Emergency Medicine

## 2016-07-02 DIAGNOSIS — Z7951 Long term (current) use of inhaled steroids: Secondary | ICD-10-CM | POA: Diagnosis not present

## 2016-07-02 DIAGNOSIS — J069 Acute upper respiratory infection, unspecified: Secondary | ICD-10-CM | POA: Insufficient documentation

## 2016-07-02 DIAGNOSIS — Z853 Personal history of malignant neoplasm of breast: Secondary | ICD-10-CM | POA: Diagnosis not present

## 2016-07-02 DIAGNOSIS — Z79899 Other long term (current) drug therapy: Secondary | ICD-10-CM | POA: Insufficient documentation

## 2016-07-02 DIAGNOSIS — R51 Headache: Secondary | ICD-10-CM | POA: Insufficient documentation

## 2016-07-02 DIAGNOSIS — J45909 Unspecified asthma, uncomplicated: Secondary | ICD-10-CM | POA: Insufficient documentation

## 2016-07-02 DIAGNOSIS — R509 Fever, unspecified: Secondary | ICD-10-CM | POA: Diagnosis present

## 2016-07-02 LAB — COMPREHENSIVE METABOLIC PANEL
ALT: 25 U/L (ref 14–54)
AST: 24 U/L (ref 15–41)
Albumin: 4.3 g/dL (ref 3.5–5.0)
Alkaline Phosphatase: 73 U/L (ref 38–126)
Anion gap: 4 — ABNORMAL LOW (ref 5–15)
BUN: 11 mg/dL (ref 6–20)
CO2: 23 mmol/L (ref 22–32)
Calcium: 9.7 mg/dL (ref 8.9–10.3)
Chloride: 104 mmol/L (ref 101–111)
Creatinine, Ser: 0.91 mg/dL (ref 0.44–1.00)
GFR calc Af Amer: >60 mL/min (ref >60)
GFR calc non Af Amer: >60 mL/min (ref >60)
Glucose, Bld: 112 mg/dL — ABNORMAL HIGH (ref 65–99)
Potassium: 3.8 mmol/L (ref 3.5–5.1)
Sodium: 131 mmol/L — ABNORMAL LOW (ref 135–145)
Total Bilirubin: 0.5 mg/dL (ref 0.3–1.2)
Total Protein: 8.3 g/dL — ABNORMAL HIGH (ref 6.5–8.1)

## 2016-07-02 MED ORDER — SODIUM CHLORIDE 0.9 % IV BOLUS (SEPSIS)
1000.0000 mL | Freq: Once | INTRAVENOUS | Status: AC
  Administered 2016-07-02: 1000 mL via INTRAVENOUS

## 2016-07-02 MED ORDER — IBUPROFEN 800 MG PO TABS
800.0000 mg | ORAL_TABLET | Freq: Once | ORAL | Status: AC
  Administered 2016-07-02: 800 mg via ORAL
  Filled 2016-07-02: qty 1

## 2016-07-02 MED ORDER — ONDANSETRON HCL 4 MG/2ML IJ SOLN
4.0000 mg | Freq: Once | INTRAMUSCULAR | Status: AC
  Administered 2016-07-02: 4 mg via INTRAVENOUS
  Filled 2016-07-02: qty 2

## 2016-07-02 NOTE — ED Provider Notes (Signed)
[facility] DEPT Provider Note   CSN: [id number] Arrival date & time: 07/02/16  2158 By signing my name below, I, Cindy Bradshaw, attest that this documentation has been prepared under the direction and in the presence of [name], MD Electronically Signed: Dyke Bradshaw, Scribe. 07/02/2016. 11:24 PM.   History   Chief Complaint Chief Complaint  Patient presents with  . Fever  . Otalgia   HPI Cindy Bradshaw is a 45 y.o. female who presents to the Emergency Department complaining of constant fever which began three days ago. She notes associated nausea, vomiting, diarrhea, chills, generalized weakness, sore throat, and otalgia. She has taken tylenol and motrin with no relief. No alleviating or modifying factors noted.  Pt was seen by her PCP three days ago and was tested for the flu. Pt denies any cough.   The history is provided by the patient. No language interpreter was used.   Past Medical History:  Diagnosis Date  . Arthritis   . Asthma   . Back disorder    degenerative disk disease  . Breast cancer ([place])   . Cancer ([place])   . GERD (gastroesophageal reflux disease)   . Mammogram abnormal 08/24/15   first  . Migraine   . Pap smear for cervical cancer screening 08/12/15    Patient Active Problem List   Diagnosis Date Noted  . Allergic reaction   . Pruritus 04/23/2016  . Chest pain   . Diaphoresis   . Lightheadedness   . Pain in the chest   . SOB (shortness of breath)   . Abnormal nuclear stress test   . Unstable angina ([place]) 01/21/2016  . Right ankle pain 01/21/2016  . Nausea with vomiting 01/04/2016  . Adnexal cyst: Right per CT 12/09/2015 12/12/2015  . Asthma 12/09/2015  . Abdominal pain 12/09/2015  . Chemotherapy-induced peripheral neuropathy ([place]) 12/05/2015  . Genetic testing 10/13/2015  . Dehydration 09/25/2015  . Family history of breast cancer in female 09/20/2015  . Normocytic normochromic anemia 09/19/2015  . Breast cancer of  upper-outer quadrant of left female breast ([place]) 09/07/2015    Past Surgical History:  Procedure Laterality Date  . BREAST LUMPECTOMY WITH RADIOACTIVE SEED AND SENTINEL LYMPH NODE BIOPSY Left 02/13/2016   Procedure: BREAST LUMPECTOMY WITH RADIOACTIVE SEED AND SENTINEL LYMPH NODE BIOPSY;  Surgeon: Excell Seltzer, MD;  Location: [facility];  Service: General;  Laterality: Left;  . BREAST REDUCTION SURGERY Bilateral 02/21/2016   Procedure: MAMMARY REDUCTION  (BREAST)/Oncoplastic breast reconstruction;  Surgeon: Irene Limbo, MD;  Location: [facility];  Service: Plastics;  Laterality: Bilateral;  . CARDIAC CATHETERIZATION [name] 01/24/2016   Procedure: Left Heart Cath and Coronary Angiography;  Surgeon: Leonie Man, MD;  Location: [facility];  Service: Cardiovascular;  Laterality: [name];  . CESAREAN SECTION    . CHOLECYSTECTOMY    . PERIPHERAL VASCULAR CATHETERIZATION Right 02/13/2016   Procedure: PORTA CATH REMOVAL;  Surgeon: Excell Seltzer, MD;  Location: [facility];  Service: General;  Laterality: Right;  . PORTACATH PLACEMENT [name] 09/12/2015   Procedure: INSERTION PORT-A-CATH;  Surgeon: Excell Seltzer, MD;  [facility];  Service: General;  Laterality: [name];    OB History    Gravida Para Term Preterm AB Living   0 0 0 0 0     SAB TAB Ectopic Multiple Live Births   0 0 0           Home Medications    Prior to Admission  medications   Medication Sig Start Date End Date Taking? Authorizing Provider  diphenhydrAMINE (BENADRYL) 25 mg capsule Take 1 capsule (25 mg total) by mouth every 6 (six) hours as needed for itching. 02/22/16  Yes Irene Limbo, MD  escitalopram (LEXAPRO) 20 MG tablet Take 20 mg by mouth at bedtime.   Yes Historical Provider, MD  fexofenadine (ALLEGRA) 180 MG tablet Take 180 mg by mouth 2 (two) times daily.   Yes Historical Provider, MD  gabapentin (NEURONTIN) 300 MG capsule Take 3 capsules (900 mg total)  by mouth 3 (three) times daily. 04/09/16  Yes Nicholas Lose, MD  hydrOXYzine (VISTARIL) 25 MG capsule Take 1 capsule (25 mg total) by mouth at bedtime. 04/23/16  Yes Shaylar Charmian Muff, MD  Melatonin 10 MG TABS Take 1 tablet by mouth at bedtime.    Yes Historical Provider, MD  ranitidine (ZANTAC) 150 MG tablet Take 150 mg by mouth 2 (two) times daily.   Yes Historical Provider, MD  albuterol (PROVENTIL HFA;VENTOLIN HFA) 108 (90 Base) MCG/ACT inhaler Inhale 2 puffs into the lungs every 4 (four) hours as needed for wheezing or shortness of breath.    Historical Provider, MD  ibuprofen (ADVIL,MOTRIN) 800 MG tablet Take 1 tablet (800 mg total) by mouth 3 (three) times daily. 07/03/16   [name], MD  non-metallic deodorant Jethro Poling) MISC Apply 1 application topically daily as needed.    Historical Provider, MD  ondansetron (ZOFRAN) 4 MG tablet Take 1 tablet (4 mg total) by mouth every 6 (six) hours. 07/03/16   [name], MD  oxyCODONE-acetaminophen (PERCOCET/ROXICET) 5-325 MG tablet Take 1 tablet by mouth every 8 (eight) hours as needed for severe pain. 06/07/16   Nicholas Lose, MD  triamcinolone ointment (KENALOG) 0.1 % APPLY 1 APPLICATION TOPICALLY 2 (TWO) TIMES DAILY AS NEEDED (FOR ECZEMA.). 04/10/16   Historical Provider, MD  Wound Dressings (SONAFINE) Apply 1 application topically 3 (three) times daily.    Historical Provider, MD    Family History Family History  Problem Relation Age of Onset  . Lung cancer Mother 66    2 different types of lung cancer; metastasis to brain; smoker  . Other Mother     hx of hysterectomy for unspecified reason  . Bone cancer Maternal Uncle     dx. early 84s  . Breast cancer Maternal Grandmother     dx. early 10s, s/p mastectomy  . Cancer Paternal Grandfather     unspecified type of cancer, dx. late 10s  . Congestive Heart Failure Father     smoker  . Stroke Father   . Eczema Father   . Cirrhosis Maternal Grandfather   . Heart Problems  Maternal Grandfather   . Asthma Daughter   . Allergies Daughter     hives  . Lung cancer Other     (maternal great uncle; MGM's brother); had a coal stove  . Cancer Cousin     unspecified type; d. early age (paternal first cousin once-removed)  . Cancer Cousin     dx. as a kid; in remission today; (paternal 2nd cousin)  . Cancer Cousin     unspecified type; d. early 28s; (maternal 1st cousin)    Social History Social History  Substance Use Topics  . Smoking status: Never Smoker  . Smokeless tobacco: Never Used  . Alcohol use 0.0 oz/week     Comment: occassional glass of wine     Allergies   Tape; Dilaudid [hydromorphone hcl]; Ivp dye [iodinated diagnostic agents];  Percocet [oxycodone-acetaminophen]; and Toradol [ketorolac tromethamine]   Review of Systems Review of Systems  10 systems reviewed and all are negative for acute change except as noted in the HPI.   Physical Exam Updated Vital Signs BP 117/69   Pulse 92   Temp 99.3 F (37.4 C) (Oral)   Resp 17   Ht 5\' 4"  (1.626 m)   Wt 245 lb (111.1 kg)   SpO2 93%   BMI 42.05 kg/m   Physical Exam  Constitutional: She is oriented to person, place, and time. She appears well-developed and well-nourished. No distress.  HENT:  Head: Normocephalic and atraumatic.  Right Ear: Hearing normal.  Left Ear: Hearing normal.  Nose: Nose normal.  Mouth/Throat: Oropharynx is clear and moist and mucous membranes are normal.  Eyes: Conjunctivae and EOM are normal. Pupils are equal, round, and reactive to light.  Neck: Normal range of motion. Neck supple.  Cardiovascular: Regular rhythm, S1 normal and S2 normal.  Exam reveals no gallop and no friction rub.   No murmur heard. Pulmonary/Chest: Effort normal and breath sounds normal. No respiratory distress. She exhibits no tenderness.  Abdominal: Soft. Normal appearance and bowel sounds are normal. There is no hepatosplenomegaly. There is no tenderness. There is no rebound, no  guarding, no tenderness at McBurney's point and negative Murphy's sign. No hernia.  Musculoskeletal: Normal range of motion.  Neurological: She is alert and oriented to person, place, and time. She has normal strength. No cranial nerve deficit or sensory deficit. Coordination normal. GCS eye subscore is 4. GCS verbal subscore is 5. GCS motor subscore is 6.  Skin: Skin is warm, dry and intact. No rash noted. No cyanosis.  Psychiatric: She has a normal mood and affect. Her speech is normal and behavior is normal. Thought content normal.  Nursing note and vitals reviewed.  ED Treatments / Results  DIAGNOSTIC STUDIES:  Oxygen Saturation is 98% on RA, nromal by my interpretation.    COORDINATION OF CARE:  11:20 PM Will order CMP, urinalysis, and urine culture. Discussed treatment plan with pt at bedside and pt agreed to plan.  Labs (all labs ordered are listed, but only abnormal results are displayed) Labs Reviewed  COMPREHENSIVE METABOLIC PANEL - Abnormal; Notable for the following:       Result Value   Sodium 131 (*)    Glucose, Bld 112 (*)    Total Protein 8.3 (*)    Anion gap 4 (*)    All other components within normal limits  URINALYSIS, ROUTINE W REFLEX MICROSCOPIC (NOT AT [facility]) - Abnormal; Notable for the following:    APPearance CLOUDY (*)    Hgb urine dipstick SMALL (*)    All other components within normal limits  CBC WITH DIFFERENTIAL/PLATELET - Abnormal; Notable for the following:    Hemoglobin 11.4 (*)    HCT 35.4 (*)    All other components within normal limits  CSF CELL COUNT WITH DIFFERENTIAL - Abnormal; Notable for the following:    RBC Count, CSF 453 (*)    WBC, CSF 6 (*)    All other components within normal limits  CSF CELL COUNT WITH DIFFERENTIAL - Abnormal; Notable for the following:    RBC Count, CSF 2 (*)    All other components within normal limits  URINE MICROSCOPIC-ADD ON - Abnormal; Notable for the following:    Squamous Epithelial / LPF 0-5 (*)     Bacteria, UA FEW (*)    All other components within normal limits  RAPID STREP SCREEN (NOT AT [facility])  CSF CULTURE  CULTURE, BLOOD (ROUTINE X 2)  CULTURE, BLOOD (ROUTINE X 2)  URINE CULTURE  CULTURE, GROUP A STREP [facility])  GLUCOSE, CSF  PROTEIN, CSF    EKG  EKG Interpretation None       Radiology Ct Head Wo Contrast  Result Date: 07/03/2016 CLINICAL DATA:  Acute onset of left-sided headache, earache and fever. Generalized weakness. Initial encounter. EXAM: CT HEAD WITHOUT CONTRAST TECHNIQUE: Contiguous axial images were obtained from the base of the skull through the vertex without intravenous contrast. COMPARISON:  None. FINDINGS: Brain: No evidence of acute infarction, hemorrhage, hydrocephalus, extra-axial collection or mass lesion/mass effect. The posterior fossa, including the cerebellum, brainstem and fourth ventricle, is within normal limits. The third and lateral ventricles, and basal ganglia are unremarkable in appearance. The cerebral hemispheres are symmetric in appearance, with normal gray-white differentiation. No mass effect or midline shift is seen. Vascular: No hyperdense vessel or unexpected calcification. Skull: There is no evidence of fracture; visualized osseous structures are unremarkable in appearance. Sinuses/Orbits: The orbits are within normal limits. The paranasal sinuses and mastoid air cells are well-aerated. Other: No significant soft tissue abnormalities are seen. IMPRESSION: Unremarkable noncontrast CT of the head. Electronically Signed   By: Garald Balding M.D.   On: 07/03/2016 03:08    Procedures .Lumbar Puncture Date/Time: 07/03/2016 7:49 AM Performed by: [name] Authorized by: [name]   Consent:    Consent obtained:  Written   Consent given by:  Patient   Risks discussed:  Bleeding, infection, nerve damage, repeat procedure, headache and pain   Alternatives discussed:  No treatment Universal protocol:    Procedure  explained and questions answered to patient or proxy's satisfaction: yes     Relevant documents present and verified: yes     Test results available and properly labeled: yes     Imaging studies available: yes     Required blood products, implants, devices, and special equipment available: yes     Immediately prior to procedure a time out was called: yes     Site/side marked: yes     Patient identity confirmed:  Verbally with patient and arm band Pre-procedure details:    Procedure purpose:  Diagnostic   Preparation: Patient was prepped and draped in usual sterile fashion   Anesthesia (see MAR for exact dosages):    Anesthesia method:  Local infiltration   Local anesthetic:  Lidocaine 1% w/o epi Procedure details:    Lumbar space:  L3-L4 interspace   Patient position:  Sitting   Needle gauge:  20   Needle type:  Spinal needle - Quincke tip   Needle length (in):  3.5   Ultrasound guidance: no     Number of attempts:  2   Fluid appearance:  Blood-tinged then clearing   Tubes of fluid:  4   Total volume (ml):  5 Post-procedure:    Puncture site:  Adhesive bandage applied and direct pressure applied   Patient tolerance of procedure:  Tolerated well, no immediate complications   (including critical care time)  Medications Ordered in ED Medications  sodium chloride 0.9 % bolus 1,000 mL (0 mLs Intravenous Stopped 07/03/16 0445)  ondansetron (ZOFRAN) injection 4 mg (4 mg Intravenous Given 07/02/16 2343)  ibuprofen (ADVIL,MOTRIN) tablet 800 mg (800 mg Oral Given 07/02/16 2337)  lidocaine (PF) (XYLOCAINE) 1 % injection (30 mLs  Given 07/03/16 0500)  sodium chloride 0.9 % bolus 1,000 mL (1,000 mLs Intravenous  New Bag/Given 07/03/16 0510)  metoCLOPramide (REGLAN) injection 10 mg (10 mg Intravenous Given 07/03/16 0523)     Initial Impression / Assessment and Plan / ED Course  I have reviewed the triage vital signs and the nursing notes.  Pertinent labs & imaging results that were  available during my care of the patient were reviewed by me and considered in my medical decision making (see chart for details).  Clinical Course  Patient presents to the ER for evaluation of 3 day history of fever. Patient reports that she has been having body aches, chills, sore throat and ear pain. She was seen by her primary care doctor and tested negative for influenza. She has been taking Tylenol and ibuprofen, but the fever has not been breaking. Patient administered ibuprofen 800 mg here in the ER and she did defervesce. Strep negative. Blood work unremarkable. Examination was unremarkable.  Patient started to complain about neck stiffness while she was here. Meningitis was therefore considered. She did not have a positive Kernig or Brudzinski sign. I did recommend lumbar puncture and was performed. Labs are very reassuring.  Final Clinical Impressions(s) / ED Diagnoses   Final diagnoses:  Viral upper respiratory tract infection    New Prescriptions New Prescriptions   IBUPROFEN (ADVIL,MOTRIN) 800 MG TABLET    Take 1 tablet (800 mg total) by mouth 3 (three) times daily.   ONDANSETRON (ZOFRAN) 4 MG TABLET    Take 1 tablet (4 mg total) by mouth every 6 (six) hours.  I personally performed the services described in this documentation, which was scribed in my presence. The recorded information has been reviewed and is accurate.    [name], MD 07/03/16 [phone]

## 2016-07-02 NOTE — ED Triage Notes (Addendum)
Pt reports fever, body aches, and ear pain x3 days. Pt seen by PCP and tested for the flu. Pt reports alternating tylenol and ibuprofen with no relief. Temp 103.1 in triage. Hx breast cancer. Pt reports last dose of tylenol at 2140.

## 2016-07-03 ENCOUNTER — Ambulatory Visit: Payer: Federal, State, Local not specified - PPO | Admitting: Physical Therapy

## 2016-07-03 ENCOUNTER — Emergency Department (HOSPITAL_COMMUNITY): Payer: Federal, State, Local not specified - PPO

## 2016-07-03 LAB — URINALYSIS, ROUTINE W REFLEX MICROSCOPIC
BILIRUBIN URINE: NEGATIVE
Glucose, UA: NEGATIVE mg/dL
KETONES UR: NEGATIVE mg/dL
LEUKOCYTES UA: NEGATIVE
NITRITE: NEGATIVE
PROTEIN: NEGATIVE mg/dL
Specific Gravity, Urine: 1.021 (ref 1.005–1.030)
pH: 6 (ref 5.0–8.0)

## 2016-07-03 LAB — URINE MICROSCOPIC-ADD ON

## 2016-07-03 LAB — CBC WITH DIFFERENTIAL/PLATELET
BASOS ABS: 0 10*3/uL (ref 0.0–0.1)
BASOS PCT: 0 %
EOS ABS: 0.1 10*3/uL (ref 0.0–0.7)
EOS PCT: 1 %
HCT: 35.4 % — ABNORMAL LOW (ref 36.0–46.0)
Hemoglobin: 11.4 g/dL — ABNORMAL LOW (ref 12.0–15.0)
LYMPHS PCT: 25 %
Lymphs Abs: 1.9 10*3/uL (ref 0.7–4.0)
MCH: 28.6 pg (ref 26.0–34.0)
MCHC: 32.2 g/dL (ref 30.0–36.0)
MCV: 88.9 fL (ref 78.0–100.0)
MONO ABS: 0.7 10*3/uL (ref 0.1–1.0)
Monocytes Relative: 10 %
Neutro Abs: 4.8 10*3/uL (ref 1.7–7.7)
Neutrophils Relative %: 64 %
PLATELETS: 337 10*3/uL (ref 150–400)
RBC: 3.98 MIL/uL (ref 3.87–5.11)
RDW: 14.3 % (ref 11.5–15.5)
WBC: 7.4 10*3/uL (ref 4.0–10.5)

## 2016-07-03 LAB — CSF CELL COUNT WITH DIFFERENTIAL
RBC COUNT CSF: 2 /mm3 — AB
RBC Count, CSF: 453 /mm3 — ABNORMAL HIGH
TUBE #: 1
Tube #: 4
WBC, CSF: 2 /mm3 (ref 0–5)
WBC, CSF: 6 /mm3 — ABNORMAL HIGH (ref 0–5)

## 2016-07-03 LAB — RAPID STREP SCREEN (MED CTR MEBANE ONLY): STREPTOCOCCUS, GROUP A SCREEN (DIRECT): NEGATIVE

## 2016-07-03 LAB — GLUCOSE, CSF: Glucose, CSF: 64 mg/dL (ref 40–70)

## 2016-07-03 LAB — PROTEIN, CSF: TOTAL PROTEIN, CSF: 41 mg/dL (ref 15–45)

## 2016-07-03 MED ORDER — ONDANSETRON HCL 4 MG PO TABS: 4.0000 mg | ORAL_TABLET | Freq: Four times a day (QID) | ORAL | 0 refills | Status: DC

## 2016-07-03 MED ORDER — METOCLOPRAMIDE HCL 5 MG/ML IJ SOLN
10.0000 mg | Freq: Once | INTRAMUSCULAR | Status: AC
  Administered 2016-07-03: 10 mg via INTRAVENOUS
  Filled 2016-07-03: qty 2

## 2016-07-03 MED ORDER — LIDOCAINE HCL (PF) 1 % IJ SOLN
INTRAMUSCULAR | Status: AC
  Administered 2016-07-03: 30 mL
  Filled 2016-07-03: qty 30

## 2016-07-03 MED ORDER — IBUPROFEN 800 MG PO TABS: 800.0000 mg | ORAL_TABLET | Freq: Three times a day (TID) | ORAL | 0 refills | Status: DC

## 2016-07-03 MED ORDER — SODIUM CHLORIDE 0.9 % IV BOLUS (SEPSIS)
1000.0000 mL | Freq: Once | INTRAVENOUS | Status: AC
  Administered 2016-07-03: 1000 mL via INTRAVENOUS

## 2016-07-04 LAB — URINE CULTURE

## 2016-07-05 ENCOUNTER — Ambulatory Visit: Payer: Federal, State, Local not specified - PPO | Attending: Hematology and Oncology | Admitting: Physical Therapy

## 2016-07-05 LAB — CULTURE, GROUP A STREP (THRC)

## 2016-07-06 LAB — CSF CULTURE W GRAM STAIN

## 2016-07-06 LAB — CSF CULTURE: CULTURE: NO GROWTH

## 2016-07-08 LAB — CULTURE, BLOOD (ROUTINE X 2)
CULTURE: NO GROWTH
Culture: NO GROWTH

## 2016-07-10 ENCOUNTER — Other Ambulatory Visit: Payer: Self-pay | Admitting: Hematology and Oncology

## 2016-07-10 DIAGNOSIS — C50412 Malignant neoplasm of upper-outer quadrant of left female breast: Secondary | ICD-10-CM

## 2016-07-10 DIAGNOSIS — G609 Hereditary and idiopathic neuropathy, unspecified: Secondary | ICD-10-CM

## 2016-07-12 ENCOUNTER — Other Ambulatory Visit: Payer: Self-pay

## 2016-07-12 DIAGNOSIS — G609 Hereditary and idiopathic neuropathy, unspecified: Secondary | ICD-10-CM

## 2016-07-12 DIAGNOSIS — C50412 Malignant neoplasm of upper-outer quadrant of left female breast: Secondary | ICD-10-CM

## 2016-07-12 MED ORDER — GABAPENTIN 300 MG PO CAPS: 900.0000 mg | ORAL_CAPSULE | Freq: Three times a day (TID) | ORAL | 2 refills | Status: DC

## 2016-07-13 ENCOUNTER — Other Ambulatory Visit: Payer: Self-pay

## 2016-07-13 DIAGNOSIS — L299 Pruritus, unspecified: Secondary | ICD-10-CM

## 2016-07-13 MED ORDER — HYDROXYZINE PAMOATE 25 MG PO CAPS: 25.0000 mg | ORAL_CAPSULE | Freq: Every day | ORAL | 0 refills | Status: DC

## 2016-07-19 ENCOUNTER — Encounter: Payer: Self-pay | Admitting: Adult Health

## 2016-07-19 NOTE — Progress Notes (Deleted)
CLINIC:  Survivorship   REASON FOR VISIT:  Routine follow-up post-treatment for a recent history of breast cancer.  BRIEF ONCOLOGIC HISTORY:    Breast cancer of upper-outer quadrant of left female breast ([place])   08/17/2015 Initial Diagnosis    left breast biopsy 1:30 position: Invasive ductal carcinoma, grade 3, ER 0%, PR 0%, HER-2 negative ratio 1.48, Ki-67 90%, 3.1 cm tumor, axilla negative, T2 N0 stage II a clinical stage      09/19/2015 - 01/03/2016 Neo-Adjuvant Chemotherapy    Neoadjuvant chemotherapy with dose dense Adriamycin and Cytoxan 4 followed by Carbo/Taxol x 3 cycles (Carbo d/c'd after cycle #3 d/t hospitalization for N&V/dehydration). Went on to complete additional 3 cycles of Taxol alone.       09/28/2015 Procedure     Left breast biopsy anterior third left upper outer quadrant: [facility];  upper inner quadrant: [place]      10/06/2015 Procedure    Genetic testing: Negative. Genes analyzed: ATM, BARD1, BRCA1, BRCA2, BRIP1, CDH1, CHEK2, FANCC, MLH1, MSH2, MSH6, NBN, PALB2, PMS2, PTEN, RAD51C, RAD51D, TP53, and XRCC2.  This panel also includes deletion/duplication analysis (without sequencing) for one gene, EPCAM      01/16/2016 Breast MRI    significant response to chemotherapy with decrease in the tumor from 5.1 cm to 2.9 cm, also decrease a non-mass enhancement      02/13/2016 Surgery    Left lumpectomy ([place]): IDC grade 3, 3.3 cm, margins negative, 0/3 lymph nodes negative, triple negative, T2 N0 stage II a pathologic stage      02/21/2016 Surgery    Bilateral breast mammoplasty ([place]); no malignancy on path      03/26/2016 - 05/10/2016 Radiation Therapy    Adjuvant radiation therapy Lisbeth Renshaw). Left breast: 50.4 Gy in 28 fractions. Left breast boost: 10 Gy in 5 fractions.        INTERVAL HISTORY:  Cindy Bradshaw presents to the [facility] today for our initial meeting to review her survivorship care plan detailing her treatment course for breast cancer, as  well as monitoring long-term side effects of that treatment, education regarding health maintenance, screening, and overall wellness and health promotion.     Overall, Cindy Bradshaw reports feeling quite well since completing her radiation therapy approximately 2 months ago.  She ***    REVIEW OF SYSTEMS:  *** GU: Denies vaginal bleeding, discharge, or dryness.  Breast: Denies any new nodularity, masses, tenderness, nipple changes, or nipple discharge.    A 14-point review of systems was completed and was negative, except as noted above.   ONCOLOGY TREATMENT TEAM:  1. Surgeon:  Dr. Harolyn Rutherford*** at [facility] 2. Medical Oncologist: Dr. Magrinat/Gudena/Feng***  3. Radiation Oncologist: Dr. Verl Bangs***    PAST MEDICAL/SURGICAL HISTORY:  Past Medical History:  Diagnosis Date  . Arthritis   . Asthma   . Back disorder    degenerative disk disease  . Breast cancer ([place])   . Cancer ([place])   . GERD (gastroesophageal reflux disease)   . Mammogram abnormal 08/24/15   first  . Migraine   . Pap smear for cervical cancer screening 08/12/15   Past Surgical History:  Procedure Laterality Date  . BREAST LUMPECTOMY WITH RADIOACTIVE SEED AND SENTINEL LYMPH NODE BIOPSY Left 02/13/2016   Procedure: BREAST LUMPECTOMY WITH RADIOACTIVE SEED AND SENTINEL LYMPH NODE BIOPSY;  Surgeon: Excell Seltzer, MD;  Location: [place];  Service: General;  Laterality: Left;  . BREAST REDUCTION SURGERY Bilateral 02/21/2016   Procedure: MAMMARY REDUCTION  (  BREAST)/Oncoplastic breast reconstruction;  Surgeon: Irene Limbo, MD;  Location: [place];  Service: Plastics;  Laterality: Bilateral;  . CARDIAC CATHETERIZATION [name] 01/24/2016   Procedure: Left Heart Cath and Coronary Angiography;  Surgeon: Leonie Man, MD;  Location: [place] [facility];  Service: Cardiovascular;  Laterality: [name];  . CESAREAN SECTION    .  CHOLECYSTECTOMY    . PERIPHERAL VASCULAR CATHETERIZATION Right 02/13/2016   Procedure: PORTA CATH REMOVAL;  Surgeon: Excell Seltzer, MD;  Location: [facility] [facility];  Service: General;  Laterality: Right;  . PORTACATH PLACEMENT [name] 09/12/2015   Procedure: INSERTION PORT-A-CATH;  Surgeon: Excell Seltzer, MD;  Location: [facility];  Service: General;  Laterality: [name];     ALLERGIES:  Allergies  Allergen Reactions  . Tape Rash and Other (See Comments)    Itching, red bumps from  Hypafix  Tape.  . Dilaudid [Hydromorphone Hcl] Itching  . Ivp Dye [Iodinated Diagnostic Agents] Itching  . Percocet [Oxycodone-Acetaminophen] Itching  . Toradol [Ketorolac Tromethamine] Itching     CURRENT MEDICATIONS:  Outpatient Encounter Prescriptions as of 07/19/2016  Medication Sig Note  . albuterol (PROVENTIL HFA;VENTOLIN HFA) 108 (90 Base) MCG/ACT inhaler Inhale 2 puffs into the lungs every 4 (four) hours as needed for wheezing or shortness of breath.   . diphenhydrAMINE (BENADRYL) 25 mg capsule Take 1 capsule (25 mg total) by mouth every 6 (six) hours as needed for itching.   . escitalopram (LEXAPRO) 20 MG tablet Take 20 mg by mouth at bedtime.   . fexofenadine (ALLEGRA) 180 MG tablet Take 180 mg by mouth 2 (two) times daily.   Marland Kitchen gabapentin (NEURONTIN) 300 MG capsule Take 3 capsules (900 mg total) by mouth 3 (three) times daily.   . hydrOXYzine (VISTARIL) 25 MG capsule Take 1 capsule (25 mg total) by mouth at bedtime.   Marland Kitchen ibuprofen (ADVIL,MOTRIN) 800 MG tablet Take 1 tablet (800 mg total) by mouth 3 (three) times daily.   . Melatonin 10 MG TABS Take 1 tablet by mouth at bedtime.  07/02/2016: Received from: [facility])  . non-metallic deodorant (ALRA) MISC Apply 1 application topically daily as needed.   . ondansetron (ZOFRAN) 4 MG tablet Take 1 tablet (4 mg total) by mouth every 6 (six) hours.   Marland Kitchen oxyCODONE-acetaminophen (PERCOCET/ROXICET)  5-325 MG tablet Take 1 tablet by mouth every 8 (eight) hours as needed for severe pain.   . ranitidine (ZANTAC) 150 MG tablet Take 150 mg by mouth 2 (two) times daily.   Marland Kitchen triamcinolone ointment (KENALOG) 0.1 % APPLY 1 APPLICATION TOPICALLY 2 (TWO) TIMES DAILY AS NEEDED (FOR ECZEMA.). 04/23/2016: Received from: External Pharmacy  . Wound Dressings (SONAFINE) Apply 1 application topically 3 (three) times daily.    Facility-Administered Encounter Medications as of 07/19/2016  Medication  . 0.9 %  sodium chloride infusion     ONCOLOGIC FAMILY HISTORY:  Family History  Problem Relation Age of Onset  . Lung cancer Mother 18    2 different types of lung cancer; metastasis to brain; smoker  . Other Mother     hx of hysterectomy for unspecified reason  . Bone cancer Maternal Uncle     dx. early 29s  . Breast cancer Maternal Grandmother     dx. early 7s, s/p mastectomy  . Cancer Paternal Grandfather     unspecified type of cancer, dx. late 84s  . Congestive Heart Failure Father     smoker  . Stroke Father   .  Eczema Father   . Cirrhosis Maternal Grandfather   . Heart Problems Maternal Grandfather   . Asthma Daughter   . Allergies Daughter     hives  . Lung cancer Other     (maternal great uncle; MGM's brother); had a coal stove  . Cancer Cousin     unspecified type; d. early age (paternal first cousin once-removed)  . Cancer Cousin     dx. as a kid; in remission today; (paternal 2nd cousin)  . Cancer Cousin     unspecified type; d. early 43s; (maternal 1st cousin)     GENETIC COUNSELING/TESTING: ***  SOCIAL HISTORY:  Marcheta K. Sakuma is /single/married/divorced/widowed/separated and lives alone/with her spouse/family/friend in (city), [place].  She has (#) children and they live in (city).  Ms. Caywood is currently retired/disabled/working part-time/full-time as ***.  She denies any current or history of tobacco, alcohol, or illicit drug use.     PHYSICAL EXAMINATION:    Vital Signs:  There were no vitals filed for this visit. There were no vitals filed for this visit. General: Well-nourished, well-appearing female in no acute distress.  She is unaccompanied/accompanied in clinic by her ***** today.   HEENT: Head is normocephalic.  Pupils equal and reactive to light. Conjunctivae clear without exudate.  Sclerae anicteric. Oral mucosa is pink, moist.  Oropharynx is pink without lesions or erythema.  Lymph: No cervical, supraclavicular, or infraclavicular lymphadenopathy noted on palpation.  Cardiovascular: Regular rate and rhythm.Marland Kitchen Respiratory: Clear to auscultation bilaterally. Chest expansion symmetric; breathing non-labored.  GI: Abdomen soft and round; non-tender, non-distended. Bowel sounds normoactive.  GU: Deferred.  Neuro: No focal deficits. Steady gait.  Psych: Mood and affect normal and appropriate for situation.  Extremities: No edema. Skin: Warm and dry.  LABORATORY DATA:  None for this visit.  DIAGNOSTIC IMAGING:  None for this visit.      ASSESSMENT AND PLAN:  Ms.. Bradshaw is a pleasant 45 y.o. female with Stage *** right/left breast invasive ductal carcinoma, ER+/PR+/HER2-, diagnosed in (date), treated with lumpectomy, adjuvant radiation therapy, and anti-estrogen therapy with *** beginning in (date).  She presents to the Survivorship Clinic for our initial meeting and routine follow-up post-completion of treatment for breast cancer.    1. Stage *** right/left breast cancer:  Cindy Bradshaw is continuing to recover from definitive treatment for breast cancer. She will follow-up with her medical oncologist, Dr. Ross Ludwig in (month) /2017 with history and physical exam per surveillance protocol.  She will continue her anti-estrogen therapy with (drug). Thus far, she is tolerating the *** well, with minimal side effects. She was instructed to make Dr. Lindi Adie or myself aware if she begins to experience any worsening side effects of the  medication and I could see her back in clinic to help manage those side effects, as needed. Though the incidence is low, there is an associated risk of endometrial cancer with anti-estrogen therapies like Tamoxifen.  Cindy Bradshaw was encouraged to contact Dr. Carrington Bradshaw or myself with any vaginal bleeding while taking Tamoxifen. Other side effects of Tamoxifen were again reviewed with her as well. Today, a comprehensive survivorship care plan and treatment summary was reviewed with the patient today detailing her breast cancer diagnosis, treatment course, potential late/long-term effects of treatment, appropriate follow-up care with recommendations for the future, and patient education resources.  A copy of this summary, along with a letter will be sent to the patient's primary care provider via mail/fax/In Basket message after today's visit.    #.  Problem(s) at Visit______________  #. Bone health:  Given Cindy Bradshaw's age/history of breast cancer and her current treatment regimen including anti-estrogen therapy with _______, she is at risk for bone demineralization.  Her last DEXA scan was **/**/20**, which showed (results).***  In the meantime, she was encouraged to increase her consumption of foods rich in calcium, as well as increase her weight-bearing activities.  She was given education on specific activities to promote bone health.  #. Cancer screening:  Due to Cindy Bradshaw's history and her age, she should receive screening for skin cancers, colon cancer, and gynecologic cancers.  The information and recommendations are listed on the patient's comprehensive care plan/treatment summary and were reviewed in detail with the patient.    #. Health maintenance and wellness promotion: Cindy Bradshaw was encouraged to consume 5-7 servings of fruits and vegetables per day. We reviewed the "Nutrition Rainbow" handout, as well as the handout "Take Control of Your Health and Reduce Your Cancer Risk" from the [place].  She was also encouraged to engage in moderate to vigorous exercise for 30 minutes per day most days of the week. We discussed the [employer] fitness program, which is designed for cancer survivors to help them become more physically fit after cancer treatments.  She was instructed to limit her alcohol consumption and continue to abstain from tobacco use/***was encouraged stop smoking.     #. Support services/counseling: It is not uncommon for this period of the patient's cancer care trajectory to be one of many emotions and stressors.  We discussed an opportunity for her to participate in the next session of [facility] ("Finding Your New Normal") support group series designed for patients after they have completed treatment.   Cindy Bradshaw was encouraged to take advantage of our many other support services programs, support groups, and/or counseling in coping with her new life as a cancer survivor after completing anti-cancer treatment.  She was offered support today through active listening and expressive supportive counseling.  She was given information regarding our available services and encouraged to contact me with any questions or for help enrolling in any of our support group/programs.    Dispo:   -Return to cancer center ***  -She is welcome to return back to the Survivorship Clinic at any time; no additional follow-up needed at this time.  -Consider referral back to survivorship as a long-term survivor for continued surveillance  A total of (#) minutes of face-to-face time was spent with this patient with greater than 50% of that time in counseling and care-coordination.   Mike Craze, NP Survivorship Program [place] [phone]   Note: PRIMARY CARE PROVIDER [name], [place] [phone]

## 2016-07-24 ENCOUNTER — Encounter (HOSPITAL_COMMUNITY): Payer: Self-pay | Admitting: Emergency Medicine

## 2016-07-24 ENCOUNTER — Emergency Department (HOSPITAL_COMMUNITY): Payer: Federal, State, Local not specified - PPO

## 2016-07-24 ENCOUNTER — Emergency Department (HOSPITAL_COMMUNITY)
Admission: EM | Admit: 2016-07-24 | Discharge: 2016-07-24 | Disposition: A | Discharge status: Home / Self Care | Payer: Federal, State, Local not specified - PPO | Attending: Emergency Medicine | Admitting: Emergency Medicine

## 2016-07-24 DIAGNOSIS — R0602 Shortness of breath: Secondary | ICD-10-CM | POA: Diagnosis present

## 2016-07-24 DIAGNOSIS — J45901 Unspecified asthma with (acute) exacerbation: Secondary | ICD-10-CM | POA: Diagnosis not present

## 2016-07-24 DIAGNOSIS — Z853 Personal history of malignant neoplasm of breast: Secondary | ICD-10-CM | POA: Insufficient documentation

## 2016-07-24 LAB — CBC WITH DIFFERENTIAL/PLATELET
BASOS ABS: 0 10*3/uL (ref 0.0–0.1)
Basophils Relative: 0 %
Eosinophils Absolute: 0.1 10*3/uL (ref 0.0–0.7)
Eosinophils Relative: 2 %
HEMATOCRIT: 39 % (ref 36.0–46.0)
HEMOGLOBIN: 12.4 g/dL (ref 12.0–15.0)
LYMPHS PCT: 30 %
Lymphs Abs: 1.4 10*3/uL (ref 0.7–4.0)
MCH: 28.4 pg (ref 26.0–34.0)
MCHC: 31.8 g/dL (ref 30.0–36.0)
MCV: 89.4 fL (ref 78.0–100.0)
MONO ABS: 0.3 10*3/uL (ref 0.1–1.0)
MONOS PCT: 7 %
NEUTROS ABS: 2.8 10*3/uL (ref 1.7–7.7)
NEUTROS PCT: 61 %
Platelets: 447 10*3/uL — ABNORMAL HIGH (ref 150–400)
RBC: 4.36 MIL/uL (ref 3.87–5.11)
RDW: 14.4 % (ref 11.5–15.5)
WBC: 4.6 10*3/uL (ref 4.0–10.5)

## 2016-07-24 LAB — BASIC METABOLIC PANEL
ANION GAP: 8 (ref 5–15)
BUN: 11 mg/dL (ref 6–20)
CO2: 23 mmol/L (ref 22–32)
Calcium: 10.4 mg/dL — ABNORMAL HIGH (ref 8.9–10.3)
Chloride: 107 mmol/L (ref 101–111)
Creatinine, Ser: 0.82 mg/dL (ref 0.44–1.00)
GLUCOSE: 109 mg/dL — AB (ref 65–99)
POTASSIUM: 3.7 mmol/L (ref 3.5–5.1)
Sodium: 138 mmol/L (ref 135–145)

## 2016-07-24 LAB — I-STAT TROPONIN, ED: Troponin i, poc: 0 ng/mL (ref 0.00–0.08)

## 2016-07-24 LAB — D-DIMER, QUANTITATIVE (NOT AT ARMC): D DIMER QUANT: 0.35 ug{FEU}/mL (ref 0.00–0.50)

## 2016-07-24 MED ORDER — PREDNISONE 20 MG PO TABS: 40.0000 mg | ORAL_TABLET | Freq: Every day | ORAL | 0 refills | Status: DC

## 2016-07-24 MED ORDER — IPRATROPIUM BROMIDE 0.02 % IN SOLN: 1.0000 mg | Freq: Once | RESPIRATORY_TRACT | Status: DC

## 2016-07-24 MED ORDER — METHYLPREDNISOLONE SODIUM SUCC 125 MG IJ SOLR
125.0000 mg | Freq: Once | INTRAMUSCULAR | Status: AC
  Administered 2016-07-24: 125 mg via INTRAVENOUS
  Filled 2016-07-24: qty 2

## 2016-07-24 MED ORDER — IPRATROPIUM BROMIDE 0.02 % IN SOLN
RESPIRATORY_TRACT | Status: AC
  Filled 2016-07-24: qty 5

## 2016-07-24 MED ORDER — ALBUTEROL SULFATE (2.5 MG/3ML) 0.083% IN NEBU: 5.0000 mg | INHALATION_SOLUTION | Freq: Once | RESPIRATORY_TRACT | Status: DC

## 2016-07-24 MED ORDER — ALBUTEROL SULFATE HFA 108 (90 BASE) MCG/ACT IN AERS: 1.0000 | INHALATION_SPRAY | Freq: Four times a day (QID) | RESPIRATORY_TRACT | 0 refills | Status: AC | PRN

## 2016-07-24 MED ORDER — ALBUTEROL (5 MG/ML) CONTINUOUS INHALATION SOLN
15.0000 mg/h | INHALATION_SOLUTION | RESPIRATORY_TRACT | Status: DC
  Filled 2016-07-24: qty 40

## 2016-07-24 MED ORDER — ALBUTEROL (5 MG/ML) CONTINUOUS INHALATION SOLN
15.0000 mg/h | INHALATION_SOLUTION | RESPIRATORY_TRACT | Status: DC
  Administered 2016-07-24: 15 mg/h via RESPIRATORY_TRACT

## 2016-07-24 MED ORDER — ALBUTEROL SULFATE HFA 108 (90 BASE) MCG/ACT IN AERS
2.0000 | INHALATION_SPRAY | Freq: Once | RESPIRATORY_TRACT | Status: AC
  Administered 2016-07-24: 2 via RESPIRATORY_TRACT
  Filled 2016-07-24: qty 6.7

## 2016-07-24 MED ORDER — IPRATROPIUM BROMIDE 0.02 % IN SOLN
1.0000 mg | Freq: Once | RESPIRATORY_TRACT | Status: AC
  Administered 2016-07-24: 1 mg via RESPIRATORY_TRACT

## 2016-07-24 NOTE — ED Provider Notes (Signed)
[facility] DEPT Provider Note   CSN: [id number] Arrival date & time: 07/24/16  0854     History   Chief Complaint Chief Complaint  Patient presents with  . Shortness of Breath    HPI Cindy Bradshaw is a 45 y.o. female.  Cindy Bradshaw is a 45 y.o. Female who presents to the ED complaining of sudden onset of SOB and wheezing starting today. She does reports she is out of her albuterol inhaler. She reports her symptoms started today. She reports it feels like her asthma attack. History is somewhat limited as the patient is wheezing and coughing and unable to speak in complete sentences. She denies fevers.    The history is provided by the patient and medical records. The history is limited by the condition of the patient. No language interpreter was used.  Shortness of Breath  Associated symptoms include cough and wheezing. Pertinent negatives include no fever.    Past Medical History:  Diagnosis Date  . Arthritis   . Asthma   . Back disorder    degenerative disk disease  . Breast cancer ([place])   . Cancer ([place])   . GERD (gastroesophageal reflux disease)   . Mammogram abnormal 08/24/15   first  . Migraine   . Pap smear for cervical cancer screening 08/12/15    Patient Active Problem List   Diagnosis Date Noted  . Allergic reaction   . Pruritus 04/23/2016  . Chest pain   . Diaphoresis   . Lightheadedness   . Pain in the chest   . SOB (shortness of breath)   . Abnormal nuclear stress test   . Unstable angina ([place]) 01/21/2016  . Right ankle pain 01/21/2016  . Nausea with vomiting 01/04/2016  . Adnexal cyst: Right per CT 12/09/2015 12/12/2015  . Asthma 12/09/2015  . Abdominal pain 12/09/2015  . Chemotherapy-induced peripheral neuropathy ([place]) 12/05/2015  . Genetic testing 10/13/2015  . Dehydration 09/25/2015  . Family history of breast cancer in female 09/20/2015  . Normocytic normochromic anemia 09/19/2015  . Breast cancer of upper-outer quadrant of left  female breast ([place]) 09/07/2015    Past Surgical History:  Procedure Laterality Date  . BREAST LUMPECTOMY WITH RADIOACTIVE SEED AND SENTINEL LYMPH NODE BIOPSY Left 02/13/2016   Procedure: BREAST LUMPECTOMY WITH RADIOACTIVE SEED AND SENTINEL LYMPH NODE BIOPSY;  Surgeon: Excell Seltzer, MD;  Location: [facility];  Service: General;  Laterality: Left;  . BREAST REDUCTION SURGERY Bilateral 02/21/2016   Procedure: MAMMARY REDUCTION  (BREAST)/Oncoplastic breast reconstruction;  Surgeon: Irene Limbo, MD;  Location: [facility];  Service: Plastics;  Laterality: Bilateral;  . CARDIAC CATHETERIZATION [name] 01/24/2016   Procedure: Left Heart Cath and Coronary Angiography;  Surgeon: Leonie Man, MD;  Location: [facility];  Service: Cardiovascular;  Laterality: [name];  . CESAREAN SECTION    . CHOLECYSTECTOMY    . PERIPHERAL VASCULAR CATHETERIZATION Right 02/13/2016   Procedure: PORTA CATH REMOVAL;  Surgeon: Excell Seltzer, MD;  Location: [facility];  Service: General;  Laterality: Right;  . PORTACATH PLACEMENT [name] 09/12/2015   Procedure: INSERTION PORT-A-CATH;  Surgeon: Excell Seltzer, MD;  Location: [facility];  Service: General;  Laterality: [name];    OB History    Gravida Para Term Preterm AB Living   0 0 0 0 0     SAB TAB Ectopic Multiple Live Births   0 0 0           Home Medications  Prior to Admission medications   Medication Sig Start Date End Date Taking? Authorizing Provider  diphenhydrAMINE (BENADRYL) 25 mg capsule Take 1 capsule (25 mg total) by mouth every 6 (six) hours as needed for itching. 02/22/16  Yes Irene Limbo, MD  escitalopram (LEXAPRO) 20 MG tablet Take 20 mg by mouth at bedtime.   Yes Historical Provider, MD  fexofenadine (ALLEGRA) 180 MG tablet Take 180 mg by mouth 2 (two) times daily.   Yes Historical Provider, MD  gabapentin (NEURONTIN) 300 MG capsule Take 3 capsules (900 mg total) by mouth 3 (three) times  daily. 07/12/16  Yes Nicholas Lose, MD  hydrOXYzine (VISTARIL) 25 MG capsule Take 1 capsule (25 mg total) by mouth at bedtime. 07/13/16  Yes Shaylar Charmian Muff, MD  Melatonin 10 MG TABS Take 1 tablet by mouth at bedtime.    Yes Historical Provider, MD  non-metallic deodorant Jethro Poling) MISC Apply 1 application topically daily as needed (for skin).    Yes Historical Provider, MD  oxyCODONE-acetaminophen (PERCOCET/ROXICET) 5-325 MG tablet Take 1 tablet by mouth every 8 (eight) hours as needed for severe pain. 06/07/16  Yes Nicholas Lose, MD  ranitidine (ZANTAC) 150 MG tablet Take 150 mg by mouth 2 (two) times daily.   Yes Historical Provider, MD  triamcinolone ointment (KENALOG) 0.1 % APPLY 1 APPLICATION TOPICALLY 2 (TWO) TIMES DAILY AS NEEDED (FOR ECZEMA.). 04/10/16  Yes Historical Provider, MD  albuterol (PROVENTIL HFA;VENTOLIN HFA) 108 (90 Base) MCG/ACT inhaler Inhale 1-2 puffs into the lungs every 6 (six) hours as needed for wheezing or shortness of breath. 07/24/16   Waynetta Pean, PA-C  predniSONE (DELTASONE) 20 MG tablet Take 2 tablets (40 mg total) by mouth daily. 07/24/16   Waynetta Pean, PA-C    Family History Family History  Problem Relation Age of Onset  . Lung cancer Mother 8    2 different types of lung cancer; metastasis to brain; smoker  . Other Mother     hx of hysterectomy for unspecified reason  . Bone cancer Maternal Uncle     dx. early 31s  . Breast cancer Maternal Grandmother     dx. early 28s, s/p mastectomy  . Cancer Paternal Grandfather     unspecified type of cancer, dx. late 78s  . Congestive Heart Failure Father     smoker  . Stroke Father   . Eczema Father   . Cirrhosis Maternal Grandfather   . Heart Problems Maternal Grandfather   . Asthma Daughter   . Allergies Daughter     hives  . Lung cancer Other     (maternal great uncle; MGM's brother); had a coal stove  . Cancer Cousin     unspecified type; d. early age (paternal first cousin once-removed)  .  Cancer Cousin     dx. as a kid; in remission today; (paternal 2nd cousin)  . Cancer Cousin     unspecified type; d. early 62s; (maternal 1st cousin)    Social History Social History  Substance Use Topics  . Smoking status: Never Smoker  . Smokeless tobacco: Never Used  . Alcohol use 0.0 oz/week     Comment: occassional glass of wine     Allergies   Tape; Dilaudid [hydromorphone hcl]; Ivp dye [iodinated diagnostic agents]; Percocet [oxycodone-acetaminophen]; and Toradol [ketorolac tromethamine]   Review of Systems Review of Systems  Unable to perform ROS: Severe respiratory distress  Constitutional: Negative for fever.  Respiratory: Positive for cough, shortness of breath and wheezing.  Physical Exam Updated Vital Signs BP 113/69   Pulse (!) 129   Resp 25   SpO2 95%   Physical Exam  Constitutional: She appears well-developed and well-nourished.  Nontoxic appearing.   HENT:  Head: Normocephalic and atraumatic.  Mouth/Throat: Oropharynx is clear and moist.  Eyes: Pupils are equal, round, and reactive to light. Right eye exhibits no discharge. Left eye exhibits no discharge.  Neck: Neck supple. No JVD present.  Cardiovascular: Regular rhythm, normal heart sounds and intact distal pulses.   HR 114.  Pulmonary/Chest: She is in respiratory distress. She has wheezes.  Increased work of breathing with audible wheezing at bedside. Wheezing noted diffusely and diminished in bilateral bases.   Abdominal: Soft. There is no tenderness.  Musculoskeletal: She exhibits no edema or tenderness.  Lymphadenopathy:    She has no cervical adenopathy.  Neurological: She is alert. Coordination normal.  Skin: Skin is warm and dry. Capillary refill takes less than 2 seconds. No rash noted. She is not diaphoretic. No erythema. No pallor.  Psychiatric: Her behavior is normal. Her mood appears anxious.  Nursing note and vitals reviewed.    ED Treatments / Results  Labs (all labs  ordered are listed, but only abnormal results are displayed) Labs Reviewed  BASIC METABOLIC PANEL - Abnormal; Notable for the following:       Result Value   Glucose, Bld 109 (*)    Calcium 10.4 (*)    All other components within normal limits  CBC WITH DIFFERENTIAL/PLATELET - Abnormal; Notable for the following:    Platelets 447 (*)    All other components within normal limits  D-DIMER, QUANTITATIVE (NOT AT [facility])  Randolm Idol, ED    EKG  EKG Interpretation  Date/Time:  Tuesday July 24 2016 09:37:10 EST Ventricular Rate:  109 PR Interval:    QRS Duration: 70 QT Interval:  387 QTC Calculation: 522 R Axis:   27 Text Interpretation:  Sinus tachycardia Low voltage, precordial leads Nonspecific T abnrm, anterolateral leads Prolonged QT interval rate is faster compared to July 2017 Confirmed by Regenia Skeeter MD, [place] [phone]) on 07/24/2016 12:10:24 PM       Radiology Dg Chest Port 1 View  Result Date: 07/24/2016 CLINICAL DATA:  Shortness of breath, cough. EXAM: PORTABLE CHEST 1 VIEW COMPARISON:  Radiographs of April 12, 2016. FINDINGS: The heart size and mediastinal contours are within normal limits. Both lungs are clear. No pneumothorax or pleural effusion is noted. The visualized skeletal structures are unremarkable. IMPRESSION: No acute cardiopulmonary abnormality seen. Electronically Signed   By: Marijo Conception, M.D.   On: 07/24/2016 09:19    Procedures Procedures (including critical care time)  Medications Ordered in ED Medications  albuterol (PROVENTIL,VENTOLIN) solution continuous neb (15 mg/hr Nebulization New Bag/Given 07/24/16 0907)  ipratropium (ATROVENT) 0.02 % nebulizer solution (  Canceled Entry 07/24/16 0929)  albuterol (PROVENTIL HFA;VENTOLIN HFA) 108 (90 Base) MCG/ACT inhaler 2 puff (not administered)  ipratropium (ATROVENT) nebulizer solution 1 mg (1 mg Nebulization Given 07/24/16 0908)  methylPREDNISolone sodium succinate (SOLU-MEDROL) 125 mg/2 mL  injection 125 mg (125 mg Intravenous Given 07/24/16 0903)     Initial Impression / Assessment and Plan / ED Course  I have reviewed the triage vital signs and the nursing notes.  Pertinent labs & imaging results that were available during my care of the patient were reviewed by me and considered in my medical decision making (see chart for details).  Clinical Course    This is a 45 y.o.  Female who presents to the ED complaining of sudden onset of SOB and wheezing starting today. She does reports she is out of her albuterol inhaler. She reports her symptoms started today. She reports it feels like her asthma attack. History is somewhat limited as the patient is wheezing and coughing and unable to speak in complete sentences. I was called to bedside of this patient from anther patient's room. On exam patient is nontoxic appearing. She appears anxious and is hyperventilating. She does have audible wheezing. Wheezing noted diffusely and diminished lung sounds are bilateral bases. Oxygen saturation is 100% on room air at arrival. Patient started on continuous albuterol treatment.  At recheck patient is beginning to feel much better after just a few minutes of continuous nebulization treatment. She does tell me that her symptoms began suddenly today. She went to some chest tightness. She does have history of cancer and is tachycardic. Will obtain some screening labs and EKG to rule out ACS or PE. Troponin is not elevated. BMP and CBC are unremarkable. D-dimer is not elevated at 0.35. Chest x-ray is unremarkable. After completing her continuous nebulization treatments she reports feeling much better. She no longer has any wheezing or shortness of breath. She feels ready for discharge. She does tell me she feels jittery. HR is elevated at 122. I would expect this after her treatment. She is feeling much better. She received Solu-Medrol in the emergency department. We'll discharge her with an albuterol inhaler  and course of prednisone. I encouraged her to follow-up with her primary care doctor to discuss maintenance inhalers. I discussed strict and specific return precautions. I advised the patient to follow-up with their primary care provider this week. I advised the patient to return to the emergency department with new or worsening symptoms or new concerns. The patient verbalized understanding and agreement with plan.    This patient was discussed with Dr. Regenia Skeeter who agrees with assessment and plan.   Final Clinical Impressions(s) / ED Diagnoses   Final diagnoses:  Exacerbation of asthma, unspecified asthma severity, unspecified whether persistent    New Prescriptions New Prescriptions   ALBUTEROL (PROVENTIL HFA;VENTOLIN HFA) 108 (90 BASE) MCG/ACT INHALER    Inhale 1-2 puffs into the lungs every 6 (six) hours as needed for wheezing or shortness of breath.   PREDNISONE (DELTASONE) 20 MG TABLET    Take 2 tablets (40 mg total) by mouth daily.     Waynetta Pean, PA-C 07/24/16 1215    Sherwood Gambler, MD 08/02/16 [phone]

## 2016-07-24 NOTE — ED Triage Notes (Signed)
Patient found very SOB in from entrance of ED lobby. patient has asthma.

## 2016-09-07 ENCOUNTER — Ambulatory Visit: Payer: Self-pay | Admitting: Hematology and Oncology

## 2016-09-07 NOTE — Assessment & Plan Note (Deleted)
Left lumpectomy 02/13/2016: IDC grade 3, 3.3 cm, margins negative, 0/3 lymph nodes negative, triple negative, T2 N0 stage II a pathologic stage  Left breast biopsy 08/17/2015:1:30 position: Invasive ductal carcinoma, grade 3, ER 0%, PR 0%, HER-2 negative ratio 1.48, Ki-67 90%, 3.1 cm tumor, axilla negative, T2 N0 stage II a clinical stage Breast biopsies x 2: 09/28/2015 : [facility]  Treatment summary: 1. Completed Cycle 4 dose dense Adriamycin and Cytoxan on 10/31/15; completed cycle 3 Taxol and carboplatin, completed 7 cycles of Taxol and treatment discontinued for poor tolerance. 2. left lumpectomy 02/13/2016  3. Adjuvant radiation 03/26/2016 to 05/14/2016 Patient was offered Xeloda as well as clinical trial participation with Pembrolizumab. She did not want to do either ---------------------------------------------------------------------------------- Breast Cancer Surveillance: 1. Breast exam 09/07/2016: No palpable abnormalities of concern 2. Mammogram: Patient is due for mammograms. We will order this.  Return to clinic in 6 months for follow-up with labs

## 2016-09-20 ENCOUNTER — Encounter (HOSPITAL_COMMUNITY): Payer: Self-pay

## 2016-09-20 ENCOUNTER — Emergency Department (HOSPITAL_COMMUNITY)
Admission: EM | Admit: 2016-09-20 | Discharge: 2016-09-20 | Disposition: A | Discharge status: Home / Self Care | Payer: Federal, State, Local not specified - PPO | Attending: Emergency Medicine | Admitting: Emergency Medicine

## 2016-09-20 ENCOUNTER — Emergency Department (HOSPITAL_COMMUNITY): Payer: Federal, State, Local not specified - PPO

## 2016-09-20 DIAGNOSIS — Z853 Personal history of malignant neoplasm of breast: Secondary | ICD-10-CM | POA: Diagnosis not present

## 2016-09-20 DIAGNOSIS — J4521 Mild intermittent asthma with (acute) exacerbation: Secondary | ICD-10-CM | POA: Insufficient documentation

## 2016-09-20 DIAGNOSIS — R0602 Shortness of breath: Secondary | ICD-10-CM | POA: Diagnosis present

## 2016-09-20 LAB — COMPREHENSIVE METABOLIC PANEL
ALT: 30 U/L (ref 14–54)
ANION GAP: 10 (ref 5–15)
AST: 25 U/L (ref 15–41)
Albumin: 3.8 g/dL (ref 3.5–5.0)
Alkaline Phosphatase: 65 U/L (ref 38–126)
BILIRUBIN TOTAL: 0.1 mg/dL — AB (ref 0.3–1.2)
BUN: 12 mg/dL (ref 6–20)
CHLORIDE: 108 mmol/L (ref 101–111)
CO2: 24 mmol/L (ref 22–32)
Calcium: 10.3 mg/dL (ref 8.9–10.3)
Creatinine, Ser: 0.78 mg/dL (ref 0.44–1.00)
Glucose, Bld: 113 mg/dL — ABNORMAL HIGH (ref 65–99)
POTASSIUM: 3.5 mmol/L (ref 3.5–5.1)
Sodium: 142 mmol/L (ref 135–145)
TOTAL PROTEIN: 7.1 g/dL (ref 6.5–8.1)

## 2016-09-20 LAB — CBC WITH DIFFERENTIAL/PLATELET
BASOS PCT: 0 %
Basophils Absolute: 0 10*3/uL (ref 0.0–0.1)
EOS ABS: 0.1 10*3/uL (ref 0.0–0.7)
Eosinophils Relative: 3 %
HCT: 39.3 % (ref 36.0–46.0)
HEMOGLOBIN: 12.3 g/dL (ref 12.0–15.0)
Lymphocytes Relative: 45 %
Lymphs Abs: 2.3 10*3/uL (ref 0.7–4.0)
MCH: 28 pg (ref 26.0–34.0)
MCHC: 31.3 g/dL (ref 30.0–36.0)
MCV: 89.3 fL (ref 78.0–100.0)
Monocytes Absolute: 0.3 10*3/uL (ref 0.1–1.0)
Monocytes Relative: 5 %
NEUTROS PCT: 47 %
Neutro Abs: 2.4 10*3/uL (ref 1.7–7.7)
Platelets: 367 10*3/uL (ref 150–400)
RBC: 4.4 MIL/uL (ref 3.87–5.11)
RDW: 14 % (ref 11.5–15.5)
WBC: 5.1 10*3/uL (ref 4.0–10.5)

## 2016-09-20 LAB — TROPONIN I

## 2016-09-20 LAB — BRAIN NATRIURETIC PEPTIDE: B NATRIURETIC PEPTIDE 5: 33.9 pg/mL (ref 0.0–100.0)

## 2016-09-20 MED ORDER — MORPHINE SULFATE (PF) 4 MG/ML IV SOLN
4.0000 mg | Freq: Once | INTRAVENOUS | Status: AC
  Administered 2016-09-20: 4 mg via INTRAVENOUS
  Filled 2016-09-20: qty 1

## 2016-09-20 MED ORDER — ONDANSETRON HCL 4 MG/2ML IJ SOLN
4.0000 mg | Freq: Once | INTRAMUSCULAR | Status: AC
  Administered 2016-09-20: 4 mg via INTRAVENOUS
  Filled 2016-09-20: qty 2

## 2016-09-20 MED ORDER — AEROCHAMBER PLUS W/MASK MISC
1.0000 | Freq: Once | Status: AC
  Administered 2016-09-20: 1
  Filled 2016-09-20: qty 1

## 2016-09-20 MED ORDER — PREDNISONE 10 MG (21) PO TBPK: ORAL_TABLET | ORAL | 0 refills | Status: DC

## 2016-09-20 MED ORDER — HYDROCODONE-ACETAMINOPHEN 5-325 MG PO TABS: 1.0000 | ORAL_TABLET | ORAL | 0 refills | Status: DC | PRN

## 2016-09-20 MED ORDER — ALBUTEROL SULFATE HFA 108 (90 BASE) MCG/ACT IN AERS
1.0000 | INHALATION_SPRAY | Freq: Once | RESPIRATORY_TRACT | Status: AC
  Administered 2016-09-20: 2 via RESPIRATORY_TRACT
  Filled 2016-09-20: qty 6.7

## 2016-09-20 MED ORDER — DIPHENHYDRAMINE HCL 25 MG PO CAPS
25.0000 mg | ORAL_CAPSULE | Freq: Once | ORAL | Status: AC
  Administered 2016-09-20: 25 mg via ORAL
  Filled 2016-09-20: qty 1

## 2016-09-20 MED ORDER — ALBUTEROL (5 MG/ML) CONTINUOUS INHALATION SOLN
10.0000 mg/h | INHALATION_SOLUTION | Freq: Once | RESPIRATORY_TRACT | Status: AC
  Administered 2016-09-20: 10 mg/h via RESPIRATORY_TRACT
  Filled 2016-09-20: qty 20

## 2016-09-20 MED ORDER — HYDROCODONE-ACETAMINOPHEN 5-325 MG PO TABS
1.0000 | ORAL_TABLET | Freq: Once | ORAL | Status: AC
  Administered 2016-09-20: 1 via ORAL
  Filled 2016-09-20: qty 1

## 2016-09-20 MED ORDER — MAGNESIUM SULFATE 2 GM/50ML IV SOLN
2.0000 g | Freq: Once | INTRAVENOUS | Status: AC
  Administered 2016-09-20: 2 g via INTRAVENOUS
  Filled 2016-09-20: qty 50

## 2016-09-20 MED ORDER — METHYLPREDNISOLONE SODIUM SUCC 125 MG IJ SOLR
125.0000 mg | Freq: Once | INTRAMUSCULAR | Status: AC
  Administered 2016-09-20: 125 mg via INTRAVENOUS
  Filled 2016-09-20: qty 2

## 2016-09-20 MED ORDER — LORAZEPAM 2 MG/ML IJ SOLN
0.5000 mg | Freq: Once | INTRAMUSCULAR | Status: AC
  Administered 2016-09-20: 0.5 mg via INTRAVENOUS
  Filled 2016-09-20: qty 1

## 2016-09-20 MED ORDER — BENZONATATE 100 MG PO CAPS
200.0000 mg | ORAL_CAPSULE | Freq: Once | ORAL | Status: AC
  Administered 2016-09-20: 200 mg via ORAL
  Filled 2016-09-20: qty 2

## 2016-09-20 NOTE — ED Provider Notes (Signed)
[facility] DEPT Provider Note   CSN: [id number] Arrival date & time: 09/20/16  1802     History   Chief Complaint Chief Complaint  Patient presents with  . Shortness of Breath    HPI Cindy Bradshaw is a 46 y.o. female.  Pt presents to the ED today with sob and cough since yesterday.  The pt was brought via EMS.  She was given 10 mg albuterol, 0.5 mg atrovent en route.  Pt is very sob and is unable to give much more hx.      Past Medical History:  Diagnosis Date  . Arthritis   . Asthma   . Back disorder    degenerative disk disease  . Breast cancer ([place])   . Cancer ([place])   . GERD (gastroesophageal reflux disease)   . Mammogram abnormal 08/24/15   first  . Migraine   . Pap smear for cervical cancer screening 08/12/15    Patient Active Problem List   Diagnosis Date Noted  . Allergic reaction   . Pruritus 04/23/2016  . Chest pain   . Diaphoresis   . Lightheadedness   . Pain in the chest   . SOB (shortness of breath)   . Abnormal nuclear stress test   . Unstable angina ([place]) 01/21/2016  . Right ankle pain 01/21/2016  . Nausea with vomiting 01/04/2016  . Adnexal cyst: Right per CT 12/09/2015 12/12/2015  . Asthma 12/09/2015  . Abdominal pain 12/09/2015  . Chemotherapy-induced peripheral neuropathy ([place]) 12/05/2015  . Genetic testing 10/13/2015  . Dehydration 09/25/2015  . Family history of breast cancer in female 09/20/2015  . Normocytic normochromic anemia 09/19/2015  . Breast cancer of upper-outer quadrant of left female breast ([place]) 09/07/2015    Past Surgical History:  Procedure Laterality Date  . BREAST LUMPECTOMY WITH RADIOACTIVE SEED AND SENTINEL LYMPH NODE BIOPSY Left 02/13/2016   Procedure: BREAST LUMPECTOMY WITH RADIOACTIVE SEED AND SENTINEL LYMPH NODE BIOPSY;  Surgeon: Excell Seltzer, MD;  Location: [facility];  Service: General;  Laterality: Left;  . BREAST REDUCTION SURGERY Bilateral 02/21/2016   Procedure: MAMMARY REDUCTION   (BREAST)/Oncoplastic breast reconstruction;  Surgeon: Irene Limbo, MD;  Location: [facility];  Service: Plastics;  Laterality: Bilateral;  . CARDIAC CATHETERIZATION [name] 01/24/2016   Procedure: Left Heart Cath and Coronary Angiography;  Surgeon: Leonie Man, MD;  Location: [facility];  Service: Cardiovascular;  Laterality: [name];  . CESAREAN SECTION    . CHOLECYSTECTOMY    . PERIPHERAL VASCULAR CATHETERIZATION Right 02/13/2016   Procedure: PORTA CATH REMOVAL;  Surgeon: Excell Seltzer, MD;  Location: [facility];  Service: General;  Laterality: Right;  . PORTACATH PLACEMENT [name] 09/12/2015   Procedure: INSERTION PORT-A-CATH;  Surgeon: Excell Seltzer, MD;  Location: [facility];  Service: General;  Laterality: [name];    OB History    Gravida Para Term Preterm AB Living   0 0 0 0 0     SAB TAB Ectopic Multiple Live Births   0 0 0           Home Medications    Prior to Admission medications   Medication Sig Start Date End Date Taking? Authorizing Provider  albuterol (PROVENTIL HFA;VENTOLIN HFA) 108 (90 Base) MCG/ACT inhaler Inhale 1-2 puffs into the lungs every 6 (six) hours as needed for wheezing or shortness of breath. 07/24/16   Waynetta Pean, PA-C  diphenhydrAMINE (BENADRYL) 25 mg capsule Take 1 capsule (25 mg total) by mouth every 6 (  six) hours as needed for itching. 02/22/16   Irene Limbo, MD  escitalopram (LEXAPRO) 20 MG tablet Take 20 mg by mouth at bedtime.    Historical Provider, MD  fexofenadine (ALLEGRA) 180 MG tablet Take 180 mg by mouth 2 (two) times daily.    Historical Provider, MD  gabapentin (NEURONTIN) 300 MG capsule Take 3 capsules (900 mg total) by mouth 3 (three) times daily. 07/12/16   Nicholas Lose, MD  HYDROcodone-acetaminophen (NORCO/VICODIN) 5-325 MG tablet Take 1 tablet by mouth every 4 (four) hours as needed. 09/20/16   Isla Pence, MD  hydrOXYzine (VISTARIL) 25 MG capsule Take 1 capsule (25 mg total) by mouth at  bedtime. 07/13/16   Shaylar Charmian Muff, MD  Melatonin 10 MG TABS Take 1 tablet by mouth at bedtime.     Historical Provider, MD  non-metallic deodorant Jethro Poling) MISC Apply 1 application topically daily as needed (for skin).     Historical Provider, MD  oxyCODONE-acetaminophen (PERCOCET/ROXICET) 5-325 MG tablet Take 1 tablet by mouth every 8 (eight) hours as needed for severe pain. 06/07/16   Nicholas Lose, MD  predniSONE (STERAPRED UNI-PAK 21 TAB) 10 MG (21) TBPK tablet Take 6 tabs by mouth daily  for 2 days, then 5 tabs for 2 days, then 4 tabs for 2 days, then 3 tabs for 2 days, 2 tabs for 2 days, then 1 tab by mouth daily for 2 days 09/20/16   Isla Pence, MD  ranitidine (ZANTAC) 150 MG tablet Take 150 mg by mouth 2 (two) times daily.    Historical Provider, MD  triamcinolone ointment (KENALOG) 0.1 % APPLY 1 APPLICATION TOPICALLY 2 (TWO) TIMES DAILY AS NEEDED (FOR ECZEMA.). 04/10/16   Historical Provider, MD    Family History Family History  Problem Relation Age of Onset  . Lung cancer Mother 37    2 different types of lung cancer; metastasis to brain; smoker  . Other Mother     hx of hysterectomy for unspecified reason  . Bone cancer Maternal Uncle     dx. early 58s  . Breast cancer Maternal Grandmother     dx. early 60s, s/p mastectomy  . Cancer Paternal Grandfather     unspecified type of cancer, dx. late 7s  . Congestive Heart Failure Father     smoker  . Stroke Father   . Eczema Father   . Cirrhosis Maternal Grandfather   . Heart Problems Maternal Grandfather   . Asthma Daughter   . Allergies Daughter     hives  . Lung cancer Other     (maternal great uncle; MGM's brother); had a coal stove  . Cancer Cousin     unspecified type; d. early age (paternal first cousin once-removed)  . Cancer Cousin     dx. as a kid; in remission today; (paternal 2nd cousin)  . Cancer Cousin     unspecified type; d. early 30s; (maternal 1st cousin)    Social History Social History    Substance Use Topics  . Smoking status: Never Smoker  . Smokeless tobacco: Never Used  . Alcohol use 0.0 oz/week     Comment: occassional glass of wine     Allergies   Tape; Dilaudid [hydromorphone hcl]; Ivp dye [iodinated diagnostic agents]; Percocet [oxycodone-acetaminophen]; and Toradol [ketorolac tromethamine]   Review of Systems Review of Systems  Respiratory: Positive for cough, shortness of breath and wheezing.      Physical Exam Updated Vital Signs BP 118/73 (BP Location: Right Arm)   Pulse Marland Kitchen)  137   Temp 99.3 F (37.4 C) (Oral)   Resp 24   Ht 5\' 6"  (1.676 m)   Wt 230 lb (104.3 kg)   SpO2 96%   BMI 37.12 kg/m   Physical Exam  Constitutional: She is oriented to person, place, and time. She appears well-developed. She appears distressed.  HENT:  Head: Normocephalic and atraumatic.  Right Ear: External ear normal.  Left Ear: External ear normal.  Nose: Nose normal.  Mouth/Throat: Oropharynx is clear and moist.  Eyes: Conjunctivae and EOM are normal. Pupils are equal, round, and reactive to light.  Neck: Normal range of motion. Neck supple.  Cardiovascular: Regular rhythm, normal heart sounds and intact distal pulses.  Tachycardia present.   Pulmonary/Chest: She is in respiratory distress. She has wheezes.  Abdominal: Soft. Bowel sounds are normal.  Musculoskeletal: Normal range of motion.  Neurological: She is alert and oriented to person, place, and time.  Skin: Skin is warm.  Psychiatric: She has a normal mood and affect. Her behavior is normal. Judgment and thought content normal.  Nursing note and vitals reviewed.    ED Treatments / Results  Labs (all labs ordered are listed, but only abnormal results are displayed) Labs Reviewed  COMPREHENSIVE METABOLIC PANEL - Abnormal; Notable for the following:       Result Value   Glucose, Bld 113 (*)    Total Bilirubin 0.1 (*)    All other components within normal limits  CBC WITH DIFFERENTIAL/PLATELET   BRAIN NATRIURETIC PEPTIDE  TROPONIN I  URINALYSIS, ROUTINE W REFLEX MICROSCOPIC    EKG  EKG Interpretation  Date/Time:  Thursday September 20 2016 18:45:29 EST Ventricular Rate:  109 PR Interval:    QRS Duration: 88 QT Interval:  322 QTC Calculation: 434 R Axis:   39 Text Interpretation:  Sinus tachycardia Atrial premature complex Low voltage, precordial leads RSR' in V1 or V2, right VCD or RVH Confirmed by [facility] MD, Toshia Larkin [phone]) on 09/20/2016 7:05:07 PM       Radiology Dg Chest Port 1 View  Result Date: 09/20/2016 CLINICAL DATA:  Shortness of breath for 2 days EXAM: PORTABLE CHEST 1 VIEW COMPARISON:  July 24, 2016 FINDINGS: The heart size and mediastinal contours are within normal limits. Both lungs are clear. The visualized skeletal structures are unremarkable. IMPRESSION: No active disease. Electronically Signed   By: Dorise Bullion III M.D   On: 09/20/2016 18:48    Procedures Procedures (including critical care time)  Medications Ordered in ED Medications  albuterol (PROVENTIL HFA;VENTOLIN HFA) 108 (90 Base) MCG/ACT inhaler 1-2 puff (not administered)  aerochamber plus with mask device 1 each (not administered)  HYDROcodone-acetaminophen (NORCO/VICODIN) 5-325 MG per tablet 1 tablet (not administered)  diphenhydrAMINE (BENADRYL) capsule 25 mg (not administered)  albuterol (PROVENTIL,VENTOLIN) solution continuous neb (10 mg/hr Nebulization Given 09/20/16 1956)  methylPREDNISolone sodium succinate (SOLU-MEDROL) 125 mg/2 mL injection 125 mg (125 mg Intravenous Given 09/20/16 1904)  benzonatate (TESSALON) capsule 200 mg (200 mg Oral Given 09/20/16 1904)  magnesium sulfate IVPB 2 g 50 mL (0 g Intravenous Stopped 09/20/16 2059)  LORazepam (ATIVAN) injection 0.5 mg (0.5 mg Intravenous Given 09/20/16 1911)  morphine 4 MG/ML injection 4 mg (4 mg Intravenous Given 09/20/16 1956)  ondansetron (ZOFRAN) injection 4 mg (4 mg Intravenous Given 09/20/16 1956)     Initial Impression /  Assessment and Plan / ED Course  I have reviewed the triage vital signs and the nursing notes.  Pertinent labs & imaging results that were available during  my care of the patient were reviewed by me and considered in my medical decision making (see chart for details).    Pt is breathing much easier after appropriate treatment.  While here, she did complain of left chest wall and back pain.  It is likely from coughing so hard.  She was given morphine for that pain.  The pt was ambulated and maintained good O2 sats.  She will be d/c with albuterol inhaler and spacer and prednisone with (#10) lortab.  She knows to return if worse.   Final Clinical Impressions(s) / ED Diagnoses   Final diagnoses:  Mild intermittent asthma with exacerbation    New Prescriptions New Prescriptions   HYDROCODONE-ACETAMINOPHEN (NORCO/VICODIN) 5-325 MG TABLET    Take 1 tablet by mouth every 4 (four) hours as needed.   PREDNISONE (STERAPRED UNI-PAK 21 TAB) 10 MG (21) TBPK TABLET    Take 6 tabs by mouth daily  for 2 days, then 5 tabs for 2 days, then 4 tabs for 2 days, then 3 tabs for 2 days, 2 tabs for 2 days, then 1 tab by mouth daily for 2 days     Isla Pence, MD 09/20/16 2223

## 2016-09-20 NOTE — ED Triage Notes (Signed)
Pt. Coming from home via [facility] for cough since yesterday. Pt. sts OTC meds are not helping. Pt. Very strong and persistent cough with EMS and unable to talk. EMS noted wheezing to upper lobes bilateral and gave 10mg  albuterol and 0.5 atrovent. EMS reports 12-lead unremarkable. Pt. Hx of asthma. EDP at bedside.

## 2016-10-03 ENCOUNTER — Telehealth: Payer: Self-pay | Admitting: Emergency Medicine

## 2016-10-03 NOTE — Telephone Encounter (Signed)
Patient called to reschedule appointment due to missing Jan 2018 appointment. New appointment date and time made and given to patient; patient verbalized understanding.

## 2016-10-10 ENCOUNTER — Other Ambulatory Visit: Payer: Self-pay | Admitting: Internal Medicine

## 2016-10-10 ENCOUNTER — Ambulatory Visit
Admission: RE | Admit: 2016-10-10 | Discharge: 2016-10-10 | Disposition: A | Discharge status: Home / Self Care | Payer: Federal, State, Local not specified - PPO | Source: Ambulatory Visit | Attending: Internal Medicine | Admitting: Internal Medicine

## 2016-10-10 DIAGNOSIS — R911 Solitary pulmonary nodule: Secondary | ICD-10-CM

## 2016-10-10 DIAGNOSIS — J45901 Unspecified asthma with (acute) exacerbation: Secondary | ICD-10-CM

## 2016-10-23 NOTE — Assessment & Plan Note (Signed)
Left lumpectomy 02/13/2016: IDC grade 3, 3.3 cm, margins negative, 0/3 lymph nodes negative, triple negative, T2 N0 stage II a pathologic stage  Left breast biopsy 08/17/2015:1:30 position: Invasive ductal carcinoma, grade 3, ER 0%, PR 0%, HER-2 negative ratio 1.48, Ki-67 90%, 3.1 cm tumor, axilla negative, T2 N0 stage II a clinical stage Breast biopsies x 2: 09/28/2015 : [place]  Treatment summary: 1. Completed Cycle 4 dose dense Adriamycin and Cytoxan on 10/31/15; completed cycle 3 Taxol and carboplatin, completed 7 cycles of Taxol and treatment discontinued for poor tolerance. 2. left lumpectomy 02/13/2016  3. Adjuvant radiation 03/26/2016 to 05/14/2016  Treatment plan: Patient was offered treatment with Xeloda as well as immunotherapy clinical trial. She refused both of those options.  Breast Cancer Surveillance: 1. Breast exam 10/24/2016: Normal 2. Mammogram annually  Return to clinic in 6 months for surveillance and follow-up.

## 2016-10-24 ENCOUNTER — Encounter: Payer: Self-pay | Admitting: Hematology and Oncology

## 2016-10-24 ENCOUNTER — Ambulatory Visit (HOSPITAL_BASED_OUTPATIENT_CLINIC_OR_DEPARTMENT_OTHER): Payer: Federal, State, Local not specified - PPO | Admitting: Hematology and Oncology

## 2016-10-24 DIAGNOSIS — Z171 Estrogen receptor negative status [ER-]: Secondary | ICD-10-CM | POA: Diagnosis not present

## 2016-10-24 DIAGNOSIS — G629 Polyneuropathy, unspecified: Secondary | ICD-10-CM

## 2016-10-24 DIAGNOSIS — C50412 Malignant neoplasm of upper-outer quadrant of left female breast: Secondary | ICD-10-CM | POA: Diagnosis not present

## 2016-10-24 NOTE — Progress Notes (Signed)
Patient Care Team: [name], MD as PCP - General (Internal Medicine)  DIAGNOSIS:  Encounter Diagnosis  Name Primary?  . Malignant neoplasm of upper-outer quadrant of left breast in female, estrogen receptor negative ([place])     SUMMARY OF ONCOLOGIC HISTORY:   Breast cancer of upper-outer quadrant of left female breast ([place])   08/17/2015 Initial Diagnosis    left breast biopsy 1:30 position: Invasive ductal carcinoma, grade 3, ER 0%, PR 0%, HER-2 negative ratio 1.48, Ki-67 90%, 3.1 cm tumor, axilla negative, T2 N0 stage II a clinical stage      09/19/2015 - 01/03/2016 Neo-Adjuvant Chemotherapy    Neoadjuvant chemotherapy with dose dense Adriamycin and Cytoxan 4 followed by Carbo/Taxol x 3 cycles (Carbo d/c'd after cycle #3 d/t hospitalization for N&V/dehydration). Went on to complete additional 3 cycles of Taxol alone.       09/28/2015 Procedure     Left breast biopsy anterior third left upper outer quadrant: [facility];  upper inner quadrant: [place]      10/06/2015 Procedure    Genetic testing: Negative. Genes analyzed: ATM, BARD1, BRCA1, BRCA2, BRIP1, CDH1, CHEK2, FANCC, MLH1, MSH2, MSH6, NBN, PALB2, PMS2, PTEN, RAD51C, RAD51D, TP53, and XRCC2.  This panel also includes deletion/duplication analysis (without sequencing) for one gene, EPCAM      01/16/2016 Breast MRI    significant response to chemotherapy with decrease in the tumor from 5.1 cm to 2.9 cm, also decrease a non-mass enhancement      02/13/2016 Surgery    Left lumpectomy ([facility]): IDC grade 3, 3.3 cm, margins negative, 0/3 lymph nodes negative, triple negative, T2 N0 stage II a pathologic stage      02/21/2016 Surgery    Bilateral breast mammoplasty ([place]); no malignancy on path      03/26/2016 - 05/10/2016 Radiation Therapy    Adjuvant radiation therapy Lisbeth Renshaw). Left breast: 50.4 Gy in 28 fractions. Left breast boost: 10 Gy in 5 fractions.        CHIEF COMPLIANT: Surveillance of breast cancer  INTERVAL  HISTORY: Cindy Bradshaw is a 46 year old with above-mentioned history of left breast cancer who received neoadjuvant chemotherapy followed by lumpectomy and radiation and is currently on surveillance. She does feel that her neuropathy is almost gone. Energy levels are back to normal. Denies any pain or discomfort in the breast.  REVIEW OF SYSTEMS:   Constitutional: Denies fevers, chills or abnormal weight loss Eyes: Denies blurriness of vision Ears, nose, mouth, throat, and face: Denies mucositis or sore throat Respiratory: Denies cough, dyspnea or wheezes Cardiovascular: Denies palpitation, chest discomfort Gastrointestinal:  Denies nausea, heartburn or change in bowel habits Skin: Denies abnormal skin rashes Lymphatics: Denies new lymphadenopathy or easy bruising Neurological:Denies numbness, tingling or new weaknesses Behavioral/Psych: Mood is stable, no new changes  Extremities: No lower extremity edema Breast:  denies any pain or lumps or nodules in either breasts All other systems were reviewed with the patient and are negative.  I have reviewed the past medical history, past surgical history, social history and family history with the patient and they are unchanged from previous note.  ALLERGIES:  is allergic to tape; dilaudid [hydromorphone hcl]; ivp dye [iodinated diagnostic agents]; percocet [oxycodone-acetaminophen]; and toradol [ketorolac tromethamine].  MEDICATIONS:  Current Outpatient Prescriptions  Medication Sig Dispense Refill  . albuterol (PROVENTIL HFA;VENTOLIN HFA) 108 (90 Base) MCG/ACT inhaler Inhale 1-2 puffs into the lungs every 6 (six) hours as needed for wheezing or shortness of breath. 1 Inhaler 0  . diphenhydrAMINE (BENADRYL) 25  mg capsule Take 1 capsule (25 mg total) by mouth every 6 (six) hours as needed for itching. 30 capsule 0  . escitalopram (LEXAPRO) 20 MG tablet Take 20 mg by mouth at bedtime.    . fexofenadine (ALLEGRA) 180 MG tablet Take 180 mg by  mouth 2 (two) times daily.    Marland Kitchen gabapentin (NEURONTIN) 300 MG capsule Take 3 capsules (900 mg total) by mouth 3 (three) times daily. 180 capsule 2  . HYDROcodone-acetaminophen (NORCO/VICODIN) 5-325 MG tablet Take 1 tablet by mouth every 4 (four) hours as needed. 10 tablet 0  . hydrOXYzine (VISTARIL) 25 MG capsule Take 1 capsule (25 mg total) by mouth at bedtime. 90 capsule 0  . Melatonin 10 MG TABS Take 1 tablet by mouth at bedtime.     . non-metallic deodorant Jethro Poling) MISC Apply 1 application topically daily as needed (for skin).     Marland Kitchen oxyCODONE-acetaminophen (PERCOCET/ROXICET) 5-325 MG tablet Take 1 tablet by mouth every 8 (eight) hours as needed for severe pain. 30 tablet 0  . predniSONE (STERAPRED UNI-PAK 21 TAB) 10 MG (21) TBPK tablet Take 6 tabs by mouth daily  for 2 days, then 5 tabs for 2 days, then 4 tabs for 2 days, then 3 tabs for 2 days, 2 tabs for 2 days, then 1 tab by mouth daily for 2 days 42 tablet 0  . ranitidine (ZANTAC) 150 MG tablet Take 150 mg by mouth 2 (two) times daily.    Marland Kitchen triamcinolone ointment (KENALOG) 0.1 % APPLY 1 APPLICATION TOPICALLY 2 (TWO) TIMES DAILY AS NEEDED (FOR ECZEMA.).  1   No current facility-administered medications for this visit.    Facility-Administered Medications Ordered in Other Visits  Medication Dose Route Frequency Provider Last Rate Last Dose  . 0.9 %  sodium chloride infusion   Intravenous Once Laurie Panda, NP        PHYSICAL EXAMINATION: ECOG PERFORMANCE STATUS: 1 - Symptomatic but completely ambulatory  Vitals:   10/24/16 0915  BP: 126/75  Pulse: 89  Resp: 18  Temp: 98.4 F (36.9 C)   Filed Weights   10/24/16 0915  Weight: 227 lb 14.4 oz (103.4 kg)    GENERAL:alert, no distress and comfortable SKIN: skin color, texture, turgor are normal, no rashes or significant lesions EYES: normal, Conjunctiva are pink and non-injected, sclera clear OROPHARYNX:no exudate, no erythema and lips, buccal mucosa, and tongue normal    NECK: supple, thyroid normal size, non-tender, without nodularity LYMPH:  no palpable lymphadenopathy in the cervical, axillary or inguinal LUNGS: clear to auscultation and percussion with normal breathing effort HEART: regular rate & rhythm and no murmurs and no lower extremity edema ABDOMEN:abdomen soft, non-tender and normal bowel sounds MUSCULOSKELETAL:no cyanosis of digits and no clubbing  NEURO: alert & oriented x 3 with fluent speech, Very mild neuropathy in the hands EXTREMITIES: No lower extremity edema BREAST: No palpable masses or nodules in either right or left breasts. No palpable axillary supraclavicular or infraclavicular adenopathy no breast tenderness or nipple discharge. (exam performed in the presence of a chaperone)  LABORATORY DATA:  I have reviewed the data as listed   Chemistry      Component Value Date/Time   NA 142 09/20/2016 1858   NA 140 04/10/2016 1224   K 3.5 09/20/2016 1858   K 3.4 (L) 04/10/2016 1224   CL 108 09/20/2016 1858   CO2 24 09/20/2016 1858   CO2 27 04/10/2016 1224   BUN 12 09/20/2016 1858   BUN 10.0  04/10/2016 1224   CREATININE 0.78 09/20/2016 1858   CREATININE 0.8 04/10/2016 1224      Component Value Date/Time   CALCIUM 10.3 09/20/2016 1858   CALCIUM 10.1 04/10/2016 1224   ALKPHOS 65 09/20/2016 1858   ALKPHOS 56 01/10/2016 1142   AST 25 09/20/2016 1858   AST 35 (H) 01/10/2016 1142   ALT 30 09/20/2016 1858   ALT 57 (H) 01/10/2016 1142   BILITOT 0.1 (L) 09/20/2016 1858   BILITOT 0.48 01/10/2016 1142       Lab Results  Component Value Date   WBC 5.1 09/20/2016   HGB 12.3 09/20/2016   HCT 39.3 09/20/2016   MCV 89.3 09/20/2016   PLT 367 09/20/2016   NEUTROABS 2.4 09/20/2016    ASSESSMENT & PLAN:  Breast cancer of upper-outer quadrant of left female breast ([place]) Left lumpectomy 02/13/2016: IDC grade 3, 3.3 cm, margins negative, 0/3 lymph nodes negative, triple negative, T2 N0 stage II a pathologic stage  Left breast biopsy  08/17/2015:1:30 position: Invasive ductal carcinoma, grade 3, ER 0%, PR 0%, HER-2 negative ratio 1.48, Ki-67 90%, 3.1 cm tumor, axilla negative, T2 N0 stage II a clinical stage Breast biopsies x 2: 09/28/2015 : [place]  Treatment summary: 1. Completed Cycle 4 dose dense Adriamycin and Cytoxan on 10/31/15; completed cycle 3 Taxol and carboplatin, completed 7 cycles of Taxol and treatment discontinued for poor tolerance. 2. left lumpectomy 02/13/2016  3. Adjuvant radiation 03/26/2016 to 05/14/2016  Treatment plan: Patient was offered treatment with Xeloda as well as immunotherapy clinical trial. She refused both of those options.  Breast Cancer Surveillance: 1. Breast exam 10/24/2016: Normal 2. Mammogram annually: Will set it up for the next 2 weeks  Return to clinic in  1 year for surveillance and follow-up.     I spent 25 minutes talking to the patient of which more than half was spent in counseling and coordination of care.  No orders of the defined types were placed in this encounter.  The patient has a good understanding of the overall plan. she agrees with it. she will call with any problems that may develop before the next visit here.   Rulon Eisenmenger, MD 10/24/16

## 2016-11-13 ENCOUNTER — Encounter (HOSPITAL_COMMUNITY): Payer: Self-pay | Admitting: Emergency Medicine

## 2016-11-13 ENCOUNTER — Emergency Department (HOSPITAL_COMMUNITY): Payer: Federal, State, Local not specified - PPO

## 2016-11-13 ENCOUNTER — Encounter: Payer: Self-pay | Admitting: Hematology and Oncology

## 2016-11-13 ENCOUNTER — Emergency Department (HOSPITAL_COMMUNITY)
Admission: EM | Admit: 2016-11-13 | Discharge: 2016-11-13 | Disposition: A | Discharge status: Home / Self Care | Payer: Federal, State, Local not specified - PPO | Attending: Emergency Medicine | Admitting: Emergency Medicine

## 2016-11-13 ENCOUNTER — Ambulatory Visit (HOSPITAL_BASED_OUTPATIENT_CLINIC_OR_DEPARTMENT_OTHER): Payer: Federal, State, Local not specified - PPO | Admitting: Hematology and Oncology

## 2016-11-13 VITALS — BP 125/80 | HR 93 | Temp 98.5°F | Resp 18 | Ht 66.0 in

## 2016-11-13 DIAGNOSIS — R911 Solitary pulmonary nodule: Secondary | ICD-10-CM | POA: Diagnosis not present

## 2016-11-13 DIAGNOSIS — R590 Localized enlarged lymph nodes: Secondary | ICD-10-CM

## 2016-11-13 DIAGNOSIS — R599 Enlarged lymph nodes, unspecified: Secondary | ICD-10-CM | POA: Diagnosis not present

## 2016-11-13 DIAGNOSIS — C50412 Malignant neoplasm of upper-outer quadrant of left female breast: Secondary | ICD-10-CM | POA: Diagnosis not present

## 2016-11-13 DIAGNOSIS — Z853 Personal history of malignant neoplasm of breast: Secondary | ICD-10-CM

## 2016-11-13 DIAGNOSIS — Z171 Estrogen receptor negative status [ER-]: Secondary | ICD-10-CM | POA: Diagnosis not present

## 2016-11-13 DIAGNOSIS — J45909 Unspecified asthma, uncomplicated: Secondary | ICD-10-CM | POA: Insufficient documentation

## 2016-11-13 DIAGNOSIS — R918 Other nonspecific abnormal finding of lung field: Secondary | ICD-10-CM | POA: Diagnosis not present

## 2016-11-13 DIAGNOSIS — G620 Drug-induced polyneuropathy: Secondary | ICD-10-CM | POA: Diagnosis not present

## 2016-11-13 DIAGNOSIS — R0603 Acute respiratory distress: Secondary | ICD-10-CM | POA: Diagnosis not present

## 2016-11-13 DIAGNOSIS — R0602 Shortness of breath: Secondary | ICD-10-CM | POA: Diagnosis present

## 2016-11-13 DIAGNOSIS — R05 Cough: Secondary | ICD-10-CM

## 2016-11-13 LAB — CBC
HEMATOCRIT: 35.9 % — AB (ref 36.0–46.0)
Hemoglobin: 11.5 g/dL — ABNORMAL LOW (ref 12.0–15.0)
MCH: 27.5 pg (ref 26.0–34.0)
MCHC: 32 g/dL (ref 30.0–36.0)
MCV: 85.9 fL (ref 78.0–100.0)
Platelets: 430 10*3/uL — ABNORMAL HIGH (ref 150–400)
RBC: 4.18 MIL/uL (ref 3.87–5.11)
RDW: 14.2 % (ref 11.5–15.5)
WBC: 5.3 10*3/uL (ref 4.0–10.5)

## 2016-11-13 LAB — BASIC METABOLIC PANEL
Anion gap: 6 (ref 5–15)
BUN: 13 mg/dL (ref 6–20)
CO2: 23 mmol/L (ref 22–32)
Calcium: 10.2 mg/dL (ref 8.9–10.3)
Chloride: 108 mmol/L (ref 101–111)
Creatinine, Ser: 0.68 mg/dL (ref 0.44–1.00)
GFR calc Af Amer: >60 mL/min (ref >60)
GFR calc non Af Amer: >60 mL/min (ref >60)
Glucose, Bld: 101 mg/dL — ABNORMAL HIGH (ref 65–99)
POTASSIUM: 3.6 mmol/L (ref 3.5–5.1)
SODIUM: 137 mmol/L (ref 135–145)

## 2016-11-13 LAB — I-STAT TROPONIN, ED: Troponin i, poc: 0.01 ng/mL (ref 0.00–0.08)

## 2016-11-13 MED ORDER — HYDROCODONE-HOMATROPINE 5-1.5 MG/5ML PO SYRP
10.0000 mL | ORAL_SOLUTION | Freq: Once | ORAL | Status: AC
  Administered 2016-11-13: 10 mL via ORAL
  Filled 2016-11-13: qty 10

## 2016-11-13 MED ORDER — DIPHENHYDRAMINE HCL 50 MG/ML IJ SOLN
25.0000 mg | Freq: Once | INTRAMUSCULAR | Status: AC
  Administered 2016-11-13: 25 mg via INTRAVENOUS
  Filled 2016-11-13: qty 1

## 2016-11-13 MED ORDER — METHYLPREDNISOLONE SODIUM SUCC 125 MG IJ SOLR
125.0000 mg | Freq: Once | INTRAMUSCULAR | Status: AC
  Administered 2016-11-13: 125 mg via INTRAVENOUS
  Filled 2016-11-13: qty 2

## 2016-11-13 MED ORDER — IOPAMIDOL (ISOVUE-370) INJECTION 76%
INTRAVENOUS | Status: AC
  Filled 2016-11-13: qty 100

## 2016-11-13 MED ORDER — HYDROCODONE-HOMATROPINE 5-1.5 MG/5ML PO SYRP: 5.0000 mL | ORAL_SOLUTION | Freq: Four times a day (QID) | ORAL | 0 refills | Status: DC | PRN

## 2016-11-13 MED ORDER — OXYCODONE-ACETAMINOPHEN 5-325 MG PO TABS: 2.0000 | ORAL_TABLET | ORAL | 0 refills | Status: DC | PRN

## 2016-11-13 MED ORDER — IOPAMIDOL (ISOVUE-370) INJECTION 76%
100.0000 mL | Freq: Once | INTRAVENOUS | Status: AC | PRN
  Administered 2016-11-13: 100 mL via INTRAVENOUS

## 2016-11-13 NOTE — ED Provider Notes (Signed)
[facility] DEPT Provider Note   CSN: [id number] Arrival date & time: 11/13/16  0849     History   Chief Complaint Chief Complaint  Patient presents with  . Shortness of Breath  . Chest Pain    HPI Cindy Bradshaw is a 46 y.o. female.  HPI Patient has a problem with recurrent shortness of breath and coughing that has been diagnosed as asthma exacerbation. This began occurring with some frequency over the past several months. Patient reports that she finished Zithromax approximately one week ago and is still finishing her prednisone taper. She reports her symptoms really did not improve. She has had fever intermittently. She reports temperature 100.4 this morning. Cough is sometimes productive of some sputum. Poor she becomes very short of breath and has prolonged coughing episodes. Inhalers do not consistently help. She is scheduled to see a pulmonologist in about a week. This would be her first visit to pulmonology per her report. Patient does have history of treated breast cancer. Past Medical History:  Diagnosis Date  . Arthritis   . Asthma   . Back disorder    degenerative disk disease  . Breast cancer ([place])   . Cancer ([place])   . GERD (gastroesophageal reflux disease)   . Mammogram abnormal 08/24/15   first  . Migraine   . Pap smear for cervical cancer screening 08/12/15    Patient Active Problem List   Diagnosis Date Noted  . Allergic reaction   . Pruritus 04/23/2016  . Chest pain   . Diaphoresis   . Lightheadedness   . Pain in the chest   . SOB (shortness of breath)   . Abnormal nuclear stress test   . Unstable angina ([place]) 01/21/2016  . Right ankle pain 01/21/2016  . Nausea with vomiting 01/04/2016  . Adnexal cyst: Right per CT 12/09/2015 12/12/2015  . Asthma 12/09/2015  . Abdominal pain 12/09/2015  . Chemotherapy-induced peripheral neuropathy ([place]) 12/05/2015  . Genetic testing 10/13/2015  . Dehydration 09/25/2015  . Family history of breast cancer in  female 09/20/2015  . Normocytic normochromic anemia 09/19/2015  . Breast cancer of upper-outer quadrant of left female breast ([place]) 09/07/2015    Past Surgical History:  Procedure Laterality Date  . BREAST LUMPECTOMY WITH RADIOACTIVE SEED AND SENTINEL LYMPH NODE BIOPSY Left 02/13/2016   Procedure: BREAST LUMPECTOMY WITH RADIOACTIVE SEED AND SENTINEL LYMPH NODE BIOPSY;  Surgeon: Excell Seltzer, MD;  Location: [facility];  Service: General;  Laterality: Left;  . BREAST REDUCTION SURGERY Bilateral 02/21/2016   Procedure: MAMMARY REDUCTION  (BREAST)/Oncoplastic breast reconstruction;  Surgeon: Irene Limbo, MD;  Location: [facility];  Service: Plastics;  Laterality: Bilateral;  . CARDIAC CATHETERIZATION [name] 01/24/2016   Procedure: Left Heart Cath and Coronary Angiography;  Surgeon: Leonie Man, MD;  Location: [facility];  Service: Cardiovascular;  Laterality: [name];  . CESAREAN SECTION    . CHOLECYSTECTOMY    . PERIPHERAL VASCULAR CATHETERIZATION Right 02/13/2016   Procedure: PORTA CATH REMOVAL;  Surgeon: Excell Seltzer, MD;  Location: [facility];  Service: General;  Laterality: Right;  . PORTACATH PLACEMENT [name] 09/12/2015   Procedure: INSERTION PORT-A-CATH;  Surgeon: Excell Seltzer, MD;  Location: [facility];  Service: General;  Laterality: [name];    OB History    Gravida Para Term Preterm AB Living   0 0 0 0 0     SAB TAB Ectopic Multiple Live Births   0 0 0  Home Medications    Prior to Admission medications   Medication Sig Start Date End Date Taking? Authorizing Provider  acetaminophen (TYLENOL) 500 MG tablet Take 1,000 mg by mouth every 6 (six) hours as needed for mild pain, moderate pain or headache.   Yes Historical Provider, MD  albuterol (PROVENTIL HFA;VENTOLIN HFA) 108 (90 Base) MCG/ACT inhaler Inhale 1-2 puffs into the lungs every 6 (six) hours as needed for wheezing or shortness of breath. 07/24/16  Yes  Waynetta Pean, PA-C  albuterol (PROVENTIL) (2.5 MG/3ML) 0.083% nebulizer solution Take 2.5 mg by nebulization every 6 (six) hours as needed for wheezing or shortness of breath.   Yes Historical Provider, MD  diphenhydrAMINE (BENADRYL) 25 mg capsule Take 1 capsule (25 mg total) by mouth every 6 (six) hours as needed for itching. Patient taking differently: Take 50 mg by mouth every 6 (six) hours as needed for itching.  02/22/16  Yes Irene Limbo, MD  escitalopram (LEXAPRO) 20 MG tablet Take 20 mg by mouth at bedtime.   Yes Historical Provider, MD  fexofenadine (ALLEGRA) 180 MG tablet Take 180 mg by mouth 2 (two) times daily.   Yes Historical Provider, MD  gabapentin (NEURONTIN) 300 MG capsule Take 3 capsules (900 mg total) by mouth 3 (three) times daily. 07/12/16  Yes Nicholas Lose, MD  HYDROcodone-acetaminophen (NORCO/VICODIN) 5-325 MG tablet Take 1 tablet by mouth every 4 (four) hours as needed. Patient taking differently: Take 1 tablet by mouth every 4 (four) hours as needed for moderate pain.  09/20/16  Yes Isla Pence, MD  hydrOXYzine (VISTARIL) 25 MG capsule Take 1 capsule (25 mg total) by mouth at bedtime. 07/13/16  Yes Shaylar Charmian Muff, MD  Melatonin 10 MG TABS Take 10 mg by mouth at bedtime.    Yes Historical Provider, MD  predniSONE (STERAPRED UNI-PAK 21 TAB) 10 MG (21) TBPK tablet Take 6 tabs by mouth daily  for 2 days, then 5 tabs for 2 days, then 4 tabs for 2 days, then 3 tabs for 2 days, 2 tabs for 2 days, then 1 tab by mouth daily for 2 days 09/20/16  Yes Isla Pence, MD  ranitidine (ZANTAC) 150 MG tablet Take 150 mg by mouth 2 (two) times daily.   Yes Historical Provider, MD    Family History Family History  Problem Relation Age of Onset  . Lung cancer Mother 68    2 different types of lung cancer; metastasis to brain; smoker  . Other Mother     hx of hysterectomy for unspecified reason  . Bone cancer Maternal Uncle     dx. early 31s  . Breast cancer Maternal  Grandmother     dx. early 41s, s/p mastectomy  . Cancer Paternal Grandfather     unspecified type of cancer, dx. late 11s  . Congestive Heart Failure Father     smoker  . Stroke Father   . Eczema Father   . Cirrhosis Maternal Grandfather   . Heart Problems Maternal Grandfather   . Asthma Daughter   . Allergies Daughter     hives  . Lung cancer Other     (maternal great uncle; MGM's brother); had a coal stove  . Cancer Cousin     unspecified type; d. early age (paternal first cousin once-removed)  . Cancer Cousin     dx. as a kid; in remission today; (paternal 2nd cousin)  . Cancer Cousin     unspecified type; d. early 69s; (maternal 1st cousin)    Social  History Social History  Substance Use Topics  . Smoking status: Never Smoker  . Smokeless tobacco: Never Used  . Alcohol use 0.0 oz/week     Comment: occassional glass of wine     Allergies   Tape; Dilaudid [hydromorphone hcl]; Ivp dye [iodinated diagnostic agents]; Percocet [oxycodone-acetaminophen]; and Toradol [ketorolac tromethamine]   Review of Systems Review of Systems 10 Systems reviewed and are negative for acute change except as noted in the HPI.   Physical Exam Updated Vital Signs BP 122/55   Pulse 92   Temp 98.3 F (36.8 C) (Oral)   Resp 20   LMP 10/26/2016   SpO2 98%   Physical Exam  Constitutional: She is oriented to person, place, and time.  Patient is alert and nontoxic. She does have mild tachypnea. morbid obesity.  HENT:  Head: Normocephalic and atraumatic.  Eyes: Conjunctivae and EOM are normal.  Cardiovascular: Normal rate, regular rhythm, normal heart sounds and intact distal pulses.   Pulmonary/Chest:  Mild tachypnea. Intermittent tight, dry cough paroxysmal. Breath sounds slightly diminished at bases but positive air flow. I am not appreciating significant present wheeze or rale at this time.  Abdominal: Soft. She exhibits no distension. There is no tenderness.  Musculoskeletal:  Normal range of motion. She exhibits no edema or tenderness.  Neurological: She is alert and oriented to person, place, and time. No cranial nerve deficit. She exhibits normal muscle tone. Coordination normal.  Skin: Skin is warm and dry.  Psychiatric: She has a normal mood and affect.     ED Treatments / Results  Labs (all labs ordered are listed, but only abnormal results are displayed) Labs Reviewed  BASIC METABOLIC PANEL - Abnormal; Notable for the following:       Result Value   Glucose, Bld 101 (*)    All other components within normal limits  CBC - Abnormal; Notable for the following:    Hemoglobin 11.5 (*)    HCT 35.9 (*)    Platelets 430 (*)    All other components within normal limits  I-STAT TROPOININ, ED    EKG  EKG Interpretation None       Radiology Dg Chest 2 View  Result Date: 11/13/2016 CLINICAL DATA:  History of breast cancer.  Shortness of breath . EXAM: CHEST  2 VIEW COMPARISON:  CT 01/22/1999 17. FINDINGS: Mild anterior mediastinal soft tissue prominence. Heart size stable. Bilateral pulmonary infiltrates are noted. Follow-up exam suggested demonstrate clearing. Previously identified nodular densities projected over the lower lungs are again noted and appears slightly more prominent. No pleural effusion or pneumothorax . No acute bony abnormality. IMPRESSION: 1. Bilateral pulmonary infiltrates are noted. Follow-up chest x-rays recommended to demonstrate clearing. 2. Mild anterior mediastinal fullness. Nodular densities previously identified projected over both lung bases and previously proven not to represent nipple shadows appears slightly more prominent on today's exam. Following demonstration of clearing of the bilateral pulmonary infiltrates a chest CT should be considered for further evaluation of the anterior mediastinal fullness and the densities noted projected over the lung bases. Electronically Signed   By: Marcello Moores  Register   On: 11/13/2016 09:30   Ct  Angio Chest Pe W/cm &/or Wo Cm  Result Date: 11/13/2016 CLINICAL DATA:  Shortness of breath with cough and chest pressure. History of breast carcinoma EXAM: CT ANGIOGRAPHY CHEST WITH CONTRAST TECHNIQUE: Multidetector CT imaging of the chest was performed using the standard protocol during bolus administration of intravenous contrast. Multiplanar CT image reconstructions and MIPs were obtained  to evaluate the vascular anatomy. Patient was pre-medicated for this examination without incident. CONTRAST:  100 mL Isovue 370 nonionic COMPARISON:  Chest CT Jan 22, 2016; chest radiograph November 13, 2016 FINDINGS: Cardiovascular: There is no demonstrable pulmonary embolus. There is no thoracic aortic aneurysm dissection. Visualized great vessels appear unremarkable. Note that the right and left common carotid arteries arise as a common trunk, an anatomic variant. Pericardium is not appreciably thickened. Mediastinum/Nodes: Thyroid appears normal. There is extensive adenopathy. There is adenopathy throughout the anterior mediastinum. The largest individual lymph node in the anterior mediastinum 2.6 x 2.1 cm. There is adenopathy adjacent to the aortic arch on the left with the largest lymph node in this area measuring 2.8 x 2.4 cm. Sub- carinal adenopathy is noted with the largest individual lymph node measuring 2.8 x 2.3 cm. There are left hilar lymph nodes, largest measuring 1.4 x 1.6 cm. There is a right hilar lymph node measuring 1.8 x 1.5 cm. Lungs/Pleura: There are pulmonary nodular lesions throughout the lungs bilaterally. Nodular lesions are noted in multiple areas bilaterally. The largest individual nodular lesion is noted in the superior segment of the right lower lobe measuring 2.6 x 2.0 cm. There are small pleural effusions bilaterally. There is no frank airspace consolidation. Upper Abdomen: Gallbladder is absent. Visualized upper abdominal structures otherwise appear unremarkable. Musculoskeletal: There is  degenerative change in the thoracic spine. No blastic or lytic bone lesions are appreciated on this study. Review of the MIP images confirms the above findings. IMPRESSION: No demonstrable pulmonary embolus. Multiple pulmonary nodular opacities with extensive adenopathy consistent with metastatic disease. Electronically Signed   By: Lowella Grip III M.D.   On: 11/13/2016 13:06    Procedures Procedures (including critical care time)  Medications Ordered in ED Medications  iopamidol (ISOVUE-370) 76 % injection (not administered)  HYDROcodone-homatropine (HYCODAN) 5-1.5 MG/5ML syrup 10 mL (10 mLs Oral Given 11/13/16 0922)  diphenhydrAMINE (BENADRYL) injection 25 mg (25 mg Intravenous Given 11/13/16 1004)  methylPREDNISolone sodium succinate (SOLU-MEDROL) 125 mg/2 mL injection 125 mg (125 mg Intravenous Given 11/13/16 1004)  diphenhydrAMINE (BENADRYL) injection 25 mg (25 mg Intravenous Given 11/13/16 1214)  iopamidol (ISOVUE-370) 76 % injection 100 mL (100 mLs Intravenous Contrast Given 11/13/16 1242)     Initial Impression / Assessment and Plan / ED Course  I have reviewed the triage vital signs and the nursing notes.  Pertinent labs & imaging results that were available during my care of the patient were reviewed by me and considered in my medical decision making (see chart for details).     Consult: Reviewed with Dr. Lindi Adie. He will see the patient in the office directly from the emergency department.  Final Clinical Impressions(s) / ED Diagnoses   Final diagnoses:  Pulmonary nodules  History of breast cancer  Mediastinal lymphadenopathy  Acute respiratory distress   The patient has been having increasing problems with respiratory distress and coughing. This has become recurrent for several months. CT scan was obtained today. Unfortunately, scan reveals significant pulmonary nodules and mediastinal lymphadenopathy. This is concerning for metastatic disease. Patient is stable. She  does not have respiratory failure at this time. Vital signs are otherwise stable. She will go directly to Dr. Geralyn Flash office to discuss further diagnostic evaluation and management. New Prescriptions New Prescriptions   No medications on file     Charlesetta Shanks, MD 11/13/16 1409

## 2016-11-13 NOTE — Progress Notes (Signed)
Patient Care Team: [name], MD as PCP - General (Internal Medicine)  DIAGNOSIS:  Encounter Diagnoses  Name Primary?  . Malignant neoplasm of upper-outer quadrant of left breast in female, estrogen receptor negative ([place]) Yes  . Multiple lung nodules on CT     SUMMARY OF ONCOLOGIC HISTORY:   Breast cancer of upper-outer quadrant of left female breast ([place])   08/17/2015 Initial Diagnosis    left breast biopsy 1:30 position: Invasive ductal carcinoma, grade 3, ER 0%, PR 0%, HER-2 negative ratio 1.48, Ki-67 90%, 3.1 cm tumor, axilla negative, T2 N0 stage II a clinical stage      09/19/2015 - 01/03/2016 Neo-Adjuvant Chemotherapy    Neoadjuvant chemotherapy with dose dense Adriamycin and Cytoxan 4 followed by Carbo/Taxol x 3 cycles (Carbo d/c'd after cycle #3 d/t hospitalization for N&V/dehydration). Went on to complete additional 3 cycles of Taxol alone.       09/28/2015 Procedure     Left breast biopsy anterior third left upper outer quadrant: [facility];  upper inner quadrant: [place]      10/06/2015 Procedure    Genetic testing: Negative. Genes analyzed: ATM, BARD1, BRCA1, BRCA2, BRIP1, CDH1, CHEK2, FANCC, MLH1, MSH2, MSH6, NBN, PALB2, PMS2, PTEN, RAD51C, RAD51D, TP53, and XRCC2.  This panel also includes deletion/duplication analysis (without sequencing) for one gene, EPCAM      01/16/2016 Breast MRI    significant response to chemotherapy with decrease in the tumor from 5.1 cm to 2.9 cm, also decrease a non-mass enhancement      02/13/2016 Surgery    Left lumpectomy ([facility]): IDC grade 3, 3.3 cm, margins negative, 0/3 lymph nodes negative, triple negative, T2 N0 stage II a pathologic stage      02/21/2016 Surgery    Bilateral breast mammoplasty ([place]); no malignancy on path      03/26/2016 - 05/10/2016 Radiation Therapy    Adjuvant radiation therapy Lisbeth Renshaw). Left breast: 50.4 Gy in 28 fractions. Left breast boost: 10 Gy in 5 fractions.       11/13/2016 Imaging    CT  chest: Extensive adenopathy throughout anterior mediastinum largest 2.6 cm,Ln at aortic arch 2.8 cm, subcarinal LN 2.8 cm, left hilar LN 1.6 cm, right hilar LN 1.8 cm, lung nodules bilaterally largest right lung 2.6 cm       CHIEF COMPLIANT: Follow-up of emergency room, complains of cough with expectoration and shortness of breath and low-grade temperature  INTERVAL HISTORY: Cindy Bradshaw is a 46 year old with above-mentioned history of left breast triple atrial breast cancer underwent neoadjuvant chemotherapy followed by lumpectomy and radiation. She was offered further adjuvant therapy but she refused. She has been complaining of a cough with expectoration over the past 1 month. Should been to urgent care in emergency rooms and was treated for bronchitis and possibly asthma. She was made upon with pulmonary which was benign but at the end of the month. Clean well today she presented back to the emergency room with the same complaints and this time she underwent a CT of the chest. CT scan showed multiple bilateral lung nodules as well as mediastinal and hilar lymph nodes. She was sent to our office from the emergency room because of the scan result. She had low-grade temperatures with a max of 101F  REVIEW OF SYSTEMS:   Constitutional: Denies fevers, chills or abnormal weight loss Eyes: Denies blurriness of vision Ears, nose, mouth, throat, and face: Denies mucositis or sore throat Respiratory: Cough with yellowish expectoration Cardiovascular: Denies palpitation, chest discomfort Gastrointestinal:  Denies nausea, heartburn or change in bowel habits Skin: Denies abnormal skin rashes Lymphatics: Denies new lymphadenopathy or easy bruising Neurological:Denies numbness, tingling or new weaknesses Behavioral/Psych: Mood is stable, no new changes  Extremities: No lower extremity edema All other systems were reviewed with the patient and are negative.  I have reviewed the past medical history,  past surgical history, social history and family history with the patient and they are unchanged from previous note.  ALLERGIES:  is allergic to tape; dilaudid [hydromorphone hcl]; ivp dye [iodinated diagnostic agents]; percocet [oxycodone-acetaminophen]; and toradol [ketorolac tromethamine].  MEDICATIONS:  Current Outpatient Prescriptions  Medication Sig Dispense Refill  . acetaminophen (TYLENOL) 500 MG tablet Take 1,000 mg by mouth every 6 (six) hours as needed for mild pain, moderate pain or headache.    . albuterol (PROVENTIL HFA;VENTOLIN HFA) 108 (90 Base) MCG/ACT inhaler Inhale 1-2 puffs into the lungs every 6 (six) hours as needed for wheezing or shortness of breath. 1 Inhaler 0  . albuterol (PROVENTIL) (2.5 MG/3ML) 0.083% nebulizer solution Take 2.5 mg by nebulization every 6 (six) hours as needed for wheezing or shortness of breath.    . diphenhydrAMINE (BENADRYL) 25 mg capsule Take 1 capsule (25 mg total) by mouth every 6 (six) hours as needed for itching. (Patient taking differently: Take 50 mg by mouth every 6 (six) hours as needed for itching. ) 30 capsule 0  . escitalopram (LEXAPRO) 20 MG tablet Take 20 mg by mouth at bedtime.    . fexofenadine (ALLEGRA) 180 MG tablet Take 180 mg by mouth 2 (two) times daily.    Marland Kitchen gabapentin (NEURONTIN) 300 MG capsule Take 3 capsules (900 mg total) by mouth 3 (three) times daily. 180 capsule 2  . HYDROcodone-acetaminophen (NORCO/VICODIN) 5-325 MG tablet Take 1 tablet by mouth every 4 (four) hours as needed. (Patient taking differently: Take 1 tablet by mouth every 4 (four) hours as needed for moderate pain. ) 10 tablet 0  . HYDROcodone-homatropine (HYCODAN) 5-1.5 MG/5ML syrup Take 5 mLs by mouth every 6 (six) hours as needed for cough. 120 mL 0  . hydrOXYzine (VISTARIL) 25 MG capsule Take 1 capsule (25 mg total) by mouth at bedtime. 90 capsule 0  . Melatonin 10 MG TABS Take 10 mg by mouth at bedtime.     Marland Kitchen oxyCODONE-acetaminophen (PERCOCET) 5-325 MG  tablet Take 2 tablets by mouth every 4 (four) hours as needed. 20 tablet 0  . predniSONE (STERAPRED UNI-PAK 21 TAB) 10 MG (21) TBPK tablet Take 6 tabs by mouth daily  for 2 days, then 5 tabs for 2 days, then 4 tabs for 2 days, then 3 tabs for 2 days, 2 tabs for 2 days, then 1 tab by mouth daily for 2 days 42 tablet 0  . ranitidine (ZANTAC) 150 MG tablet Take 150 mg by mouth 2 (two) times daily.     No current facility-administered medications for this visit.    Facility-Administered Medications Ordered in Other Visits  Medication Dose Route Frequency Provider Last Rate Last Dose  . 0.9 %  sodium chloride infusion   Intravenous Once Laurie Panda, NP        PHYSICAL EXAMINATION: ECOG PERFORMANCE STATUS: 2 - Symptomatic, <50% confined to bed  Vitals:   11/13/16 1441  BP: 125/80  Pulse: 93  Resp: 18  Temp: 98.5 F (36.9 C)   Filed Weights    GENERAL:alert, no distress and comfortable SKIN: skin color, texture, turgor are normal, no rashes or significant lesions EYES: normal,  Conjunctiva are pink and non-injected, sclera clear OROPHARYNX:no exudate, no erythema and lips, buccal mucosa, and tongue normal  NECK: supple, thyroid normal size, non-tender, without nodularity LYMPH:  no palpable lymphadenopathy in the cervical, axillary or inguinal LUNGS: No crackles or wheezes to auscultation. Patient has spasms of cough intermittently. HEART: regular rate & rhythm and no murmurs and no lower extremity edema ABDOMEN:abdomen soft, non-tender and normal bowel sounds MUSCULOSKELETAL:no cyanosis of digits and no clubbing  NEURO: alert & oriented x 3 with fluent speech, no focal motor/sensory deficits EXTREMITIES: No lower extremity edema  LABORATORY DATA:  I have reviewed the data as listed   Chemistry      Component Value Date/Time   NA 137 11/13/2016 0907   NA 140 04/10/2016 1224   K 3.6 11/13/2016 0907   K 3.4 (L) 04/10/2016 1224   CL 108 11/13/2016 0907   CO2 23 11/13/2016  0907   CO2 27 04/10/2016 1224   BUN 13 11/13/2016 0907   BUN 10.0 04/10/2016 1224   CREATININE 0.68 11/13/2016 0907   CREATININE 0.8 04/10/2016 1224      Component Value Date/Time   CALCIUM 10.2 11/13/2016 0907   CALCIUM 10.1 04/10/2016 1224   ALKPHOS 65 09/20/2016 1858   ALKPHOS 56 01/10/2016 1142   AST 25 09/20/2016 1858   AST 35 (H) 01/10/2016 1142   ALT 30 09/20/2016 1858   ALT 57 (H) 01/10/2016 1142   BILITOT 0.1 (L) 09/20/2016 1858   BILITOT 0.48 01/10/2016 1142       Lab Results  Component Value Date   WBC 5.3 11/13/2016   HGB 11.5 (L) 11/13/2016   HCT 35.9 (L) 11/13/2016   MCV 85.9 11/13/2016   PLT 430 (H) 11/13/2016   NEUTROABS 2.4 09/20/2016    ASSESSMENT & PLAN:  Breast cancer of upper-outer quadrant of left female breast ([place]) Left lumpectomy 02/13/2016: IDC grade 3, 3.3 cm, margins negative, 0/3 lymph nodes negative, triple negative, T2 N0 stage II a pathologic stage  Left breast biopsy 08/17/2015:1:30 position: Invasive ductal carcinoma, grade 3, ER 0%, PR 0%, HER-2 negative ratio 1.48, Ki-67 90%, 3.1 cm tumor, axilla negative, T2 N0 stage II a clinical stage Breast biopsies x 2: 09/28/2015 : [facility]  Treatment summary: 1. Completed Cycle 4 dose dense Adriamycin and Cytoxan on 10/31/15; completed cycle 3 Taxol and carboplatin, completed 7 cycles of Taxol and treatment discontinued for poor tolerance. 2. left lumpectomy 02/13/2016  3. Adjuvant radiation 03/26/2016 to 05/14/2016 Patient was offered treatment with Xeloda as well as immunotherapy clinical trial. She refused both of those options. ----------------------------------------------------------------------------------------------------------------------------------------- Cough with expectoration and shortness of breath: Patient had been to urgent care/emergency room 3-4 times. She was treated for asthma and bronchitis and pulmonary consult was placed. CT chest 11/13/2016 revealed multiple lung  nodules and mediastinal lymph nodes  Recommendation: I discussed the case with interventional radiology Dr. Karin Golden and he is willing to perform a CT-guided biopsy of the lung lesion. Differential diagnosis is between sarcoidosis versus fungal pneumonia versus metastatic breast cancer.  I gave her a prescription for codeine cough syrup. Return to clinic after the biopsy to discuss the pathology report and treatment plan. I encouraged the patient to keep that appointment with Dr. Creig Hines with pulmonary.     I spent 25 minutes talking to the patient of which more than half was spent in counseling and coordination of care.  Orders Placed This Encounter  Procedures  . CT BIOPSY    D/W Dr.McCullough  Please arrange it as soon as possible    Standing Status:   Future    Standing Expiration Date:   12/18/2017    Order Specific Question:   Reason for Exam (SYMPTOM  OR DIAGNOSIS REQUIRED)    Answer:   Lung nodules with h/o triple negative breast cancer    Order Specific Question:   Preferred imaging location?    Answer:   [facility]    Order Specific Question:   Is the patient pregnant?    Answer:   No   The patient has a good understanding of the overall plan. she agrees with it. she will call with any problems that may develop before the next visit here.   Rulon Eisenmenger, MD 11/13/16

## 2016-11-13 NOTE — Progress Notes (Signed)
Per Dr.Kevin Dover ok to proceed with emergent prep, scan in 1.5-2 hrs

## 2016-11-13 NOTE — Discharge Instructions (Signed)
GO DIRECTLY TO DR. Geralyn Flash OFFICE AFTER LEAVING THE EMERGENCY DEPARTMENT.

## 2016-11-13 NOTE — Assessment & Plan Note (Signed)
Left lumpectomy 02/13/2016: IDC grade 3, 3.3 cm, margins negative, 0/3 lymph nodes negative, triple negative, T2 N0 stage II a pathologic stage  Left breast biopsy 08/17/2015:1:30 position: Invasive ductal carcinoma, grade 3, ER 0%, PR 0%, HER-2 negative ratio 1.48, Ki-67 90%, 3.1 cm tumor, axilla negative, T2 N0 stage II a clinical stage Breast biopsies x 2: 09/28/2015 : [facility]  Treatment summary: 1. Completed Cycle 4 dose dense Adriamycin and Cytoxan on 10/31/15; completed cycle 3 Taxol and carboplatin, completed 7 cycles of Taxol and treatment discontinued for poor tolerance. 2. left lumpectomy 02/13/2016  3. Adjuvant radiation 03/26/2016 to 05/14/2016 Patient was offered treatment with Xeloda as well as immunotherapy clinical trial. She refused both of those options. ----------------------------------------------------------------------------------------------------------------------------------------- Cough with expectoration and shortness of breath: Patient had been to urgent care/emergency room 3-4 times. She was treated for asthma and bronchitis and pulmonary consult was placed. CT chest 11/13/2016 revealed multiple lung nodules and mediastinal lymph nodes  Recommendation: I discussed the case with interventional radiology Dr. Karin Golden and he is willing to perform a CT-guided biopsy of the lung lesion. Differential diagnosis is between sarcoidosis versus fungal pneumonia versus metastatic breast cancer.  I gave her a prescription for codeine cough syrup. Return to clinic after the biopsy to discuss the pathology report and treatment plan. I encouraged the patient to keep that appointment with Dr. Creig Hines with pulmonary.

## 2016-11-13 NOTE — ED Triage Notes (Signed)
Pt reports productive cough and shortness of breath , chest pressure. sts was recently DX with Asthma. Reports used her neb at home with no relief. Alert and oriented x 4,

## 2016-11-14 ENCOUNTER — Other Ambulatory Visit: Payer: Self-pay | Admitting: Obstetrics and Gynecology

## 2016-11-19 ENCOUNTER — Other Ambulatory Visit: Payer: Self-pay | Admitting: Radiology

## 2016-11-20 ENCOUNTER — Ambulatory Visit (HOSPITAL_COMMUNITY)
Admission: RE | Admit: 2016-11-20 | Discharge: 2016-11-20 | Disposition: A | Discharge status: Home / Self Care | Payer: Federal, State, Local not specified - PPO | Source: Ambulatory Visit | Attending: Hematology and Oncology | Admitting: Hematology and Oncology

## 2016-11-20 ENCOUNTER — Encounter (HOSPITAL_COMMUNITY): Payer: Self-pay

## 2016-11-20 ENCOUNTER — Ambulatory Visit (HOSPITAL_COMMUNITY)
Admission: RE | Admit: 2016-11-20 | Discharge: 2016-11-20 | Disposition: A | Discharge status: Home / Self Care | Payer: Federal, State, Local not specified - PPO | Source: Ambulatory Visit | Attending: Diagnostic Radiology | Admitting: Diagnostic Radiology

## 2016-11-20 DIAGNOSIS — Z853 Personal history of malignant neoplasm of breast: Secondary | ICD-10-CM | POA: Diagnosis not present

## 2016-11-20 DIAGNOSIS — Z801 Family history of malignant neoplasm of trachea, bronchus and lung: Secondary | ICD-10-CM | POA: Diagnosis not present

## 2016-11-20 DIAGNOSIS — R918 Other nonspecific abnormal finding of lung field: Secondary | ICD-10-CM | POA: Diagnosis present

## 2016-11-20 DIAGNOSIS — Z823 Family history of stroke: Secondary | ICD-10-CM | POA: Diagnosis not present

## 2016-11-20 DIAGNOSIS — Z885 Allergy status to narcotic agent status: Secondary | ICD-10-CM | POA: Insufficient documentation

## 2016-11-20 DIAGNOSIS — C7802 Secondary malignant neoplasm of left lung: Secondary | ICD-10-CM | POA: Diagnosis not present

## 2016-11-20 DIAGNOSIS — Z91041 Radiographic dye allergy status: Secondary | ICD-10-CM | POA: Diagnosis not present

## 2016-11-20 DIAGNOSIS — Z171 Estrogen receptor negative status [ER-]: Secondary | ICD-10-CM

## 2016-11-20 DIAGNOSIS — K219 Gastro-esophageal reflux disease without esophagitis: Secondary | ICD-10-CM | POA: Insufficient documentation

## 2016-11-20 DIAGNOSIS — J45909 Unspecified asthma, uncomplicated: Secondary | ICD-10-CM | POA: Insufficient documentation

## 2016-11-20 DIAGNOSIS — C50412 Malignant neoplasm of upper-outer quadrant of left female breast: Secondary | ICD-10-CM

## 2016-11-20 DIAGNOSIS — Z825 Family history of asthma and other chronic lower respiratory diseases: Secondary | ICD-10-CM | POA: Insufficient documentation

## 2016-11-20 DIAGNOSIS — Z79899 Other long term (current) drug therapy: Secondary | ICD-10-CM | POA: Diagnosis not present

## 2016-11-20 DIAGNOSIS — Z9889 Other specified postprocedural states: Secondary | ICD-10-CM

## 2016-11-20 LAB — PROTIME-INR
INR: 0.97
Prothrombin Time: 12.9 seconds (ref 11.4–15.2)

## 2016-11-20 LAB — APTT: APTT: 33 s (ref 24–36)

## 2016-11-20 LAB — CBC
HEMATOCRIT: 36 % (ref 36.0–46.0)
Hemoglobin: 11.2 g/dL — ABNORMAL LOW (ref 12.0–15.0)
MCH: 27.1 pg (ref 26.0–34.0)
MCHC: 31.1 g/dL (ref 30.0–36.0)
MCV: 87 fL (ref 78.0–100.0)
PLATELETS: 413 10*3/uL — AB (ref 150–400)
RBC: 4.14 MIL/uL (ref 3.87–5.11)
RDW: 14.2 % (ref 11.5–15.5)
WBC: 5.3 10*3/uL (ref 4.0–10.5)

## 2016-11-20 MED ORDER — FENTANYL CITRATE (PF) 100 MCG/2ML IJ SOLN
INTRAMUSCULAR | Status: DC
Start: 2016-11-20 — End: 2016-11-20
  Filled 2016-11-20: qty 2

## 2016-11-20 MED ORDER — MIDAZOLAM HCL 2 MG/2ML IJ SOLN
INTRAMUSCULAR | Status: AC
  Filled 2016-11-20: qty 2

## 2016-11-20 MED ORDER — MORPHINE SULFATE (PF) 4 MG/ML IV SOLN
INTRAVENOUS | Status: AC
  Administered 2016-11-20: 2 mg
  Filled 2016-11-20: qty 1

## 2016-11-20 MED ORDER — SODIUM CHLORIDE 0.9 % IV SOLN: INTRAVENOUS | Status: DC

## 2016-11-20 MED ORDER — FENTANYL CITRATE (PF) 100 MCG/2ML IJ SOLN
INTRAMUSCULAR | Status: AC | PRN
  Administered 2016-11-20 (×2): 25 ug via INTRAVENOUS
  Administered 2016-11-20: 50 ug via INTRAVENOUS

## 2016-11-20 MED ORDER — MIDAZOLAM HCL 2 MG/2ML IJ SOLN
INTRAMUSCULAR | Status: AC | PRN
  Administered 2016-11-20 (×3): 1 mg via INTRAVENOUS

## 2016-11-20 MED ORDER — FENTANYL CITRATE (PF) 100 MCG/2ML IJ SOLN
INTRAMUSCULAR | Status: AC
  Filled 2016-11-20: qty 2

## 2016-11-20 MED ORDER — LIDOCAINE HCL 1 % IJ SOLN
INTRAMUSCULAR | Status: AC
  Filled 2016-11-20: qty 20

## 2016-11-20 NOTE — Procedures (Signed)
  Pre-operative Diagnosis: Lung nodules and chest lymphadenopathy.  History of breast cancer       Post-operative Diagnosis: Lung nodules and chest lymphadenopathy   Indications: Need tissue diagnosis  Procedure: CT guided core biopsies of a left lower lobe pulmonary nodule.    Findings: 3 cores obtained from a left lower lobe nodule.  Minimal pleural air present after biopsy.  Complications: No immediate      EBL: Minimal  Plan: Bedrest 2 hours, CXR in one hour.

## 2016-11-20 NOTE — H&P (Signed)
Chief Complaint: Patient was seen in consultation today for lung mass biopsy at the request of [place]  Referring Physician(s): Gudena,Vinay  Supervising Physician: Markus Daft  Patient Status: [facility] - Out-pt  History of Present Illness: Cindy Bradshaw is a 46 y.o. female   Hx Left Breast Ca 2016 Has had persistent,worsening cough x 2 months Increasing shortness of breath Work up with PMD CT 11/13/16:  IMPRESSION: No demonstrable pulmonary embolus. Multiple pulmonary nodular opacities with extensive adenopathy consistent with metastatic disease. Lungs/Pleura: There are pulmonary nodular lesions throughout the lungs bilaterally. Nodular lesions are noted in multiple areas bilaterally. The largest individual nodular lesion is noted in the superior segment of the right lower lobe measuring 2.6 x 2.0 cm. There are small pleural effusions bilaterally. There is no frank airspace consolidation.  Dr Lindi Adie requesting biopsy of lung mass Approved by Dr Laurence Ferrari Now scheduled for same   Past Medical History:  Diagnosis Date  . Arthritis   . Asthma   . Back disorder    degenerative disk disease  . Breast cancer ([place])   . Cancer ([place])   . GERD (gastroesophageal reflux disease)   . Mammogram abnormal 08/24/15   first  . Migraine   . Pap smear for cervical cancer screening 08/12/15    Past Surgical History:  Procedure Laterality Date  . BREAST LUMPECTOMY WITH RADIOACTIVE SEED AND SENTINEL LYMPH NODE BIOPSY Left 02/13/2016   Procedure: BREAST LUMPECTOMY WITH RADIOACTIVE SEED AND SENTINEL LYMPH NODE BIOPSY;  Surgeon: Excell Seltzer, MD;  Location: [place];  Service: General;  Laterality: Left;  . BREAST REDUCTION SURGERY Bilateral 02/21/2016   Procedure: MAMMARY REDUCTION  (BREAST)/Oncoplastic breast reconstruction;  Surgeon: Irene Limbo, MD;  Location: [place];  Service: Plastics;  Laterality: Bilateral;  . CARDIAC  CATHETERIZATION [name] 01/24/2016   Procedure: Left Heart Cath and Coronary Angiography;  Surgeon: Leonie Man, MD;  Location: [facility];  Service: Cardiovascular;  Laterality: [name];  . CESAREAN SECTION    . CHOLECYSTECTOMY    . PERIPHERAL VASCULAR CATHETERIZATION Right 02/13/2016   Procedure: PORTA CATH REMOVAL;  Surgeon: Excell Seltzer, MD;  Location: [place];  Service: General;  Laterality: Right;  . PORTACATH PLACEMENT [name] 09/12/2015   Procedure: INSERTION PORT-A-CATH;  Surgeon: Excell Seltzer, MD;  Location: [facility];  Service: General;  Laterality: [name];    Allergies: Tape; Dilaudid [hydromorphone hcl]; Ivp dye [iodinated diagnostic agents]; Percocet [oxycodone-acetaminophen]; and Toradol [ketorolac tromethamine]  Medications: Prior to Admission medications   Medication Sig Start Date End Date Taking? Authorizing Provider  acetaminophen (TYLENOL) 500 MG tablet Take 1,000 mg by mouth every 6 (six) hours as needed for mild pain, moderate pain or headache.   Yes Historical Provider, MD  albuterol (PROVENTIL HFA;VENTOLIN HFA) 108 (90 Base) MCG/ACT inhaler Inhale 1-2 puffs into the lungs every 6 (six) hours as needed for wheezing or shortness of breath. 07/24/16  Yes Waynetta Pean, PA-C  albuterol (PROVENTIL) (2.5 MG/3ML) 0.083% nebulizer solution Take 2.5 mg by nebulization every 6 (six) hours as needed for wheezing or shortness of breath.   Yes Historical Provider, MD  diphenhydrAMINE (BENADRYL) 25 mg capsule Take 1 capsule (25 mg total) by mouth every 6 (six) hours as needed for itching. Patient taking differently: Take 50 mg by mouth every 6 (six) hours as needed for itching.  02/22/16  Yes Irene Limbo, MD  escitalopram (LEXAPRO) 20 MG tablet Take 20 mg by mouth at bedtime.   Yes  Historical Provider, MD  fexofenadine (ALLEGRA) 180 MG tablet Take 180 mg by mouth 2 (two) times daily.   Yes Historical Provider, MD  gabapentin (NEURONTIN) 300 MG capsule Take 3  capsules (900 mg total) by mouth 3 (three) times daily. 07/12/16  Yes Nicholas Lose, MD  HYDROcodone-acetaminophen (NORCO/VICODIN) 5-325 MG tablet Take 1 tablet by mouth every 4 (four) hours as needed. Patient taking differently: Take 1 tablet by mouth every 4 (four) hours as needed for moderate pain.  09/20/16  Yes Isla Pence, MD  HYDROcodone-homatropine [facility]) 5-1.5 MG/5ML syrup Take 5 mLs by mouth every 6 (six) hours as needed for cough. 11/13/16  Yes Nicholas Lose, MD  Melatonin 10 MG TABS Take 10 mg by mouth at bedtime.    Yes Historical Provider, MD  hydrOXYzine (VISTARIL) 25 MG capsule Take 1 capsule (25 mg total) by mouth at bedtime. 07/13/16   Shaylar Charmian Muff, MD  oxyCODONE-acetaminophen (PERCOCET) 5-325 MG tablet Take 2 tablets by mouth every 4 (four) hours as needed. 11/13/16   Charlesetta Shanks, MD  ranitidine (ZANTAC) 150 MG tablet Take 150 mg by mouth 2 (two) times daily.    Historical Provider, MD     Family History  Problem Relation Age of Onset  . Lung cancer Mother 95    2 different types of lung cancer; metastasis to brain; smoker  . Other Mother     hx of hysterectomy for unspecified reason  . Bone cancer Maternal Uncle     dx. early 81s  . Breast cancer Maternal Grandmother     dx. early 19s, s/p mastectomy  . Cancer Paternal Grandfather     unspecified type of cancer, dx. late 56s  . Congestive Heart Failure Father     smoker  . Stroke Father   . Eczema Father   . Cirrhosis Maternal Grandfather   . Heart Problems Maternal Grandfather   . Asthma Daughter   . Allergies Daughter     hives  . Lung cancer Other     (maternal great uncle; MGM's brother); had a coal stove  . Cancer Cousin     unspecified type; d. early age (paternal first cousin once-removed)  . Cancer Cousin     dx. as a kid; in remission today; (paternal 2nd cousin)  . Cancer Cousin     unspecified type; d. early 24s; (maternal 1st cousin)    Social History   Social History  .  Marital status: Legally Separated    Spouse name: Chrissie Noa  . Number of children: 2  . Years of education: [name]   Occupational History  . Web designer    Social History Main Topics  . Smoking status: Never Smoker  . Smokeless tobacco: Never Used  . Alcohol use 0.0 oz/week     Comment: occassional glass of wine  . Drug use: No  . Sexual activity: Yes   Other Topics Concern  . None   Social History Narrative   ** Merged History Encounter **       First MP age 32.  LMP 08/26/15.     2 children carried to term.  Age at first live birth 89   H/o oral contrasception use       Review of Systems: A 12 point ROS discussed and pertinent positives are indicated in the HPI above.  All other systems are negative.  Review of Systems  Constitutional: Positive for fatigue. Negative for activity change, appetite change and fever.  Respiratory: Positive for cough, chest  tightness and shortness of breath.   Cardiovascular: Negative for chest pain.  Gastrointestinal: Negative for abdominal pain.  Musculoskeletal: Negative for gait problem.  Neurological: Negative for weakness.  Psychiatric/Behavioral: Negative for behavioral problems and confusion.    Vital Signs: BP 119/76   Pulse 83   Temp 98.4 F (36.9 C) (Oral)   Resp 16   Ht 5\' 6"  (1.676 m)   Wt 220 lb (99.8 kg)   LMP 10/26/2016   SpO2 98%   BMI 35.51 kg/m   Physical Exam  Constitutional: She is oriented to person, place, and time.  Cardiovascular: Normal rate and regular rhythm.   Pulmonary/Chest: Effort normal and breath sounds normal. She has no wheezes.  Abdominal: Soft. Bowel sounds are normal. There is no tenderness.  Musculoskeletal: Normal range of motion.  Neurological: She is alert and oriented to person, place, and time.  Skin: Skin is warm and dry.  Psychiatric: She has a normal mood and affect. Her behavior is normal. Judgment and thought content normal.  Nursing note and vitals  reviewed.   Mallampati Score:  MD Evaluation Airway: WNL Heart: WNL Abdomen: WNL Chest/ Lungs: WNL ASA  Classification: 3 Mallampati/Airway Score: Two  Imaging: Dg Chest 2 View  Result Date: 11/13/2016 CLINICAL DATA:  History of breast cancer.  Shortness of breath . EXAM: CHEST  2 VIEW COMPARISON:  CT 01/22/1999 17. FINDINGS: Mild anterior mediastinal soft tissue prominence. Heart size stable. Bilateral pulmonary infiltrates are noted. Follow-up exam suggested demonstrate clearing. Previously identified nodular densities projected over the lower lungs are again noted and appears slightly more prominent. No pleural effusion or pneumothorax . No acute bony abnormality. IMPRESSION: 1. Bilateral pulmonary infiltrates are noted. Follow-up chest x-rays recommended to demonstrate clearing. 2. Mild anterior mediastinal fullness. Nodular densities previously identified projected over both lung bases and previously proven not to represent nipple shadows appears slightly more prominent on today's exam. Following demonstration of clearing of the bilateral pulmonary infiltrates a chest CT should be considered for further evaluation of the anterior mediastinal fullness and the densities noted projected over the lung bases. Electronically Signed   By: Marcello Moores  Register   On: 11/13/2016 09:30   Ct Angio Chest Pe W/cm &/or Wo Cm  Result Date: 11/13/2016 CLINICAL DATA:  Shortness of breath with cough and chest pressure. History of breast carcinoma EXAM: CT ANGIOGRAPHY CHEST WITH CONTRAST TECHNIQUE: Multidetector CT imaging of the chest was performed using the standard protocol during bolus administration of intravenous contrast. Multiplanar CT image reconstructions and MIPs were obtained to evaluate the vascular anatomy. Patient was pre-medicated for this examination without incident. CONTRAST:  100 mL Isovue 370 nonionic COMPARISON:  Chest CT Jan 22, 2016; chest radiograph November 13, 2016 FINDINGS: Cardiovascular:  There is no demonstrable pulmonary embolus. There is no thoracic aortic aneurysm dissection. Visualized great vessels appear unremarkable. Note that the right and left common carotid arteries arise as a common trunk, an anatomic variant. Pericardium is not appreciably thickened. Mediastinum/Nodes: Thyroid appears normal. There is extensive adenopathy. There is adenopathy throughout the anterior mediastinum. The largest individual lymph node in the anterior mediastinum 2.6 x 2.1 cm. There is adenopathy adjacent to the aortic arch on the left with the largest lymph node in this area measuring 2.8 x 2.4 cm. Sub- carinal adenopathy is noted with the largest individual lymph node measuring 2.8 x 2.3 cm. There are left hilar lymph nodes, largest measuring 1.4 x 1.6 cm. There is a right hilar lymph node measuring 1.8 x 1.5  cm. Lungs/Pleura: There are pulmonary nodular lesions throughout the lungs bilaterally. Nodular lesions are noted in multiple areas bilaterally. The largest individual nodular lesion is noted in the superior segment of the right lower lobe measuring 2.6 x 2.0 cm. There are small pleural effusions bilaterally. There is no frank airspace consolidation. Upper Abdomen: Gallbladder is absent. Visualized upper abdominal structures otherwise appear unremarkable. Musculoskeletal: There is degenerative change in the thoracic spine. No blastic or lytic bone lesions are appreciated on this study. Review of the MIP images confirms the above findings. IMPRESSION: No demonstrable pulmonary embolus. Multiple pulmonary nodular opacities with extensive adenopathy consistent with metastatic disease. Electronically Signed   By: Lowella Grip III M.D.   On: 11/13/2016 13:06    Labs:  CBC:  Recent Labs  07/24/16 0922 09/20/16 1858 11/13/16 0907 11/20/16 0615  WBC 4.6 5.1 5.3 5.3  HGB 12.4 12.3 11.5* 11.2*  HCT 39.0 39.3 35.9* 36.0  PLT 447* 367 430* 413*    COAGS:  Recent Labs  12/09/15 0452  01/21/16 1627 11/20/16 0615  INR 1.05 1.02 0.97  APTT  --   --  33    BMP:  Recent Labs  07/02/16 2242 07/24/16 0922 09/20/16 1858 11/13/16 0907  NA 131* 138 142 137  K 3.8 3.7 3.5 3.6  CL 104 107 108 108  CO2 23 23 24 23   GLUCOSE 112* 109* 113* 101*  BUN 11 11 12 13   CALCIUM 9.7 10.4* 10.3 10.2  CREATININE 0.91 0.82 0.78 0.68  GFRNONAA >60 >60 >60 >60  GFRAA >60 >60 >60 >60    LIVER FUNCTION TESTS:  Recent Labs  01/10/16 1142 04/12/16 0048 07/02/16 2242 09/20/16 1858  BILITOT 0.48 0.5 0.5 0.1*  AST 35* 47* 24 25  ALT 57* 56* 25 30  ALKPHOS 56 60 73 65  PROT 7.0 7.1 8.3* 7.1  ALBUMIN 3.7 3.8 4.3 3.8    TUMOR MARKERS: No results for input(s): AFPTM, CEA, CA199, CHROMGRNA in the last 8760 hours.  Assessment and Plan:  Hx Breast Ca (triple negative breast cancer) New lung nodules Metastatic?/sarcoid? Now for biopsy per Dr Lindi Adie request Risks and Benefits discussed with the patient including, but not limited to bleeding, hemoptysis, respiratory failure requiring intubation, infection, pneumothorax requiring chest tube placement, stroke from air embolism or even death. All of the patient's questions were answered, patient is agreeable to proceed. Consent signed and in chart.  Thank you for this interesting consult.  I greatly enjoyed meeting Taleyah K. Romano and look forward to participating in their care.  A copy of this report was sent to the requesting provider on this date.  Electronically Signed: Monia Sabal A 11/20/2016, 7:14 AM   I spent a total of  30 Minutes   in face to face in clinical consultation, greater than 50% of which was counseling/coordinating care for lung mass biopsy

## 2016-11-20 NOTE — Progress Notes (Signed)
Dr Anselm Pancoast notified of client c/o shortness of breath and he looked at cxr and no new orders

## 2016-11-20 NOTE — Progress Notes (Signed)
Client with no c/o pain or shortness of breath and Dr Anselm Pancoast notified and ok to d/c home

## 2016-11-20 NOTE — Progress Notes (Signed)
Client arrived from radiology c/o 8/10 pain at biopsy site; then c/o shortness of breath and Pam Turpin,PA notified and called radiology for stat cxr

## 2016-11-20 NOTE — Discharge Instructions (Addendum)
Needle Biopsy of the Lung, Care After °This sheet gives you information about how to care for yourself after your procedure. Your health care provider may also give you more specific instructions. If you have problems or questions, contact your health care provider. °What can I expect after the procedure? °After the procedure, it is common to have: °· Soreness, pain, and tenderness where a tissue sample was taken (biopsy site). °· A cough. °· A sore throat. °Follow these instructions at home: °Biopsy site care  °· Follow instructions from your health care provider about when to remove the bandage that was placed on the biopsy site. °· Keep the bandage dry until it has been removed. °· Check your biopsy site every day for signs of infection. Check for: °¨ More redness, swelling, or pain. °¨ More fluid or blood. °¨ Warmth to the touch. °¨ Pus or a bad smell. °General instructions  °· Rest as directed by your health care provider. Ask your health care provider what activities are safe for you. °· Do not take baths, swim, or use a hot tub until your health care provider approves. °· Take over-the-counter and prescription medicines only as told by your health care provider. °· If you have airplane travel scheduled, talk with your health care provider about when it is safe for you to travel by airplane. °· It is up to you to get the results of your procedure. Ask your health care provider, or the department that is doing the procedure, when your results will be ready. °· Keep all follow-up visits as told by your health care provider. This is important. °Contact a health care provider if: °· You have more redness, swelling, or pain around your biopsy site. °· You have more fluid or blood coming from your biopsy site. °· Your biopsy site feels warm to the touch. °· You have pus or a bad smell coming from your biopsy site. °· You have a fever. °· You have pain that does not get better with medicine. °Get help right away  if: °· You have problems breathing. °· You have chest pain. °· You cough up blood. °· You faint. °· You have a fast heart rate. °Summary °· After a needle biopsy of the lung, it is common to have a cough, a sore throat, or soreness, pain, and tenderness where a tissue sample was taken (biopsy site). °· You should check your biopsy area every day for signs of infection, including pus or a bad smell, warmth, more fluid or blood, or more redness, swelling, or pain. °· You should not take baths, swim, or use a hot tub until your health care provider approves. °· It is up to you to get the results of your procedure. Ask your health care provider, or the department that is doing the procedure, when your results will be ready. °This information is not intended to replace advice given to you by your health care provider. Make sure you discuss any questions you have with your health care provider. °Document Released: 06/17/2007 Document Revised: 07/11/2016 Document Reviewed: 07/11/2016 °Elsevier Interactive Patient Education © 2017 [employer] °Moderate Conscious Sedation, Adult, Care After °These instructions provide you with information about caring for yourself after your procedure. Your health care provider may also give you more specific instructions. Your treatment has been planned according to current medical practices, but problems sometimes occur. Call your health care provider if you have any problems or questions after your procedure. °What can I expect after the   procedure? °After your procedure, it is common: °· To feel sleepy for several hours. °· To feel clumsy and have poor balance for several hours. °· To have poor judgment for several hours. °· To vomit if you eat too soon. °Follow these instructions at home: °For at least 24 hours after the procedure:  ° °· Do not: °¨ Participate in activities where you could fall or become injured. °¨ Drive. °¨ Use heavy machinery. °¨ Drink alcohol. °¨ Take sleeping  pills or medicines that cause drowsiness. °¨ Make important decisions or sign legal documents. °¨ Take care of children on your own. °· Rest. °Eating and drinking  °· Follow the diet recommended by your health care provider. °· If you vomit: °¨ Drink water, juice, or soup when you can drink without vomiting. °¨ Make sure you have little or no nausea before eating solid foods. °General instructions  °· Have a responsible adult stay with you until you are awake and alert. °· Take over-the-counter and prescription medicines only as told by your health care provider. °· If you smoke, do not smoke without supervision. °· Keep all follow-up visits as told by your health care provider. This is important. °Contact a health care provider if: °· You keep feeling nauseous or you keep vomiting. °· You feel light-headed. °· You develop a rash. °· You have a fever. °Get help right away if: °· You have trouble breathing. °This information is not intended to replace advice given to you by your health care provider. Make sure you discuss any questions you have with your health care provider. °Document Released: 06/10/2013 Document Revised: 01/23/2016 Document Reviewed: 12/10/2015 °Elsevier Interactive Patient Education © 2017 [employer] ° °

## 2016-11-22 ENCOUNTER — Telehealth: Payer: Self-pay

## 2016-11-22 ENCOUNTER — Emergency Department (HOSPITAL_COMMUNITY)
Admission: EM | Admit: 2016-11-22 | Discharge: 2016-11-22 | Disposition: A | Discharge status: Home / Self Care | Payer: Federal, State, Local not specified - PPO | Attending: Emergency Medicine | Admitting: Emergency Medicine

## 2016-11-22 ENCOUNTER — Encounter (HOSPITAL_COMMUNITY): Payer: Self-pay

## 2016-11-22 ENCOUNTER — Emergency Department (HOSPITAL_COMMUNITY): Payer: Federal, State, Local not specified - PPO

## 2016-11-22 DIAGNOSIS — R0789 Other chest pain: Secondary | ICD-10-CM | POA: Diagnosis not present

## 2016-11-22 DIAGNOSIS — J45909 Unspecified asthma, uncomplicated: Secondary | ICD-10-CM | POA: Diagnosis not present

## 2016-11-22 DIAGNOSIS — Z853 Personal history of malignant neoplasm of breast: Secondary | ICD-10-CM | POA: Insufficient documentation

## 2016-11-22 DIAGNOSIS — R079 Chest pain, unspecified: Secondary | ICD-10-CM | POA: Diagnosis present

## 2016-11-22 DIAGNOSIS — Z79899 Other long term (current) drug therapy: Secondary | ICD-10-CM | POA: Insufficient documentation

## 2016-11-22 LAB — CBC WITH DIFFERENTIAL/PLATELET
BASOS PCT: 0 %
Basophils Absolute: 0 10*3/uL (ref 0.0–0.1)
EOS ABS: 0.1 10*3/uL (ref 0.0–0.7)
EOS PCT: 1 %
HCT: 36.3 % (ref 36.0–46.0)
Hemoglobin: 11.6 g/dL — ABNORMAL LOW (ref 12.0–15.0)
Lymphocytes Relative: 27 %
Lymphs Abs: 1.6 10*3/uL (ref 0.7–4.0)
MCH: 28 pg (ref 26.0–34.0)
MCHC: 32 g/dL (ref 30.0–36.0)
MCV: 87.5 fL (ref 78.0–100.0)
MONO ABS: 0.4 10*3/uL (ref 0.1–1.0)
MONOS PCT: 7 %
Neutro Abs: 3.8 10*3/uL (ref 1.7–7.7)
Neutrophils Relative %: 65 %
PLATELETS: 371 10*3/uL (ref 150–400)
RBC: 4.15 MIL/uL (ref 3.87–5.11)
RDW: 14.2 % (ref 11.5–15.5)
WBC: 5.8 10*3/uL (ref 4.0–10.5)

## 2016-11-22 LAB — COMPREHENSIVE METABOLIC PANEL
ALBUMIN: 3.2 g/dL — AB (ref 3.5–5.0)
ALT: 27 U/L (ref 14–54)
ANION GAP: 8 (ref 5–15)
AST: 35 U/L (ref 15–41)
Alkaline Phosphatase: 71 U/L (ref 38–126)
BILIRUBIN TOTAL: 1.5 mg/dL — AB (ref 0.3–1.2)
BUN: 12 mg/dL (ref 6–20)
CO2: 23 mmol/L (ref 22–32)
Calcium: 9.9 mg/dL (ref 8.9–10.3)
Chloride: 105 mmol/L (ref 101–111)
Creatinine, Ser: 0.65 mg/dL (ref 0.44–1.00)
GFR calc Af Amer: >60 mL/min (ref >60)
GFR calc non Af Amer: >60 mL/min (ref >60)
GLUCOSE: 90 mg/dL (ref 65–99)
POTASSIUM: 5.2 mmol/L — AB (ref 3.5–5.1)
SODIUM: 136 mmol/L (ref 135–145)
TOTAL PROTEIN: 7 g/dL (ref 6.5–8.1)

## 2016-11-22 LAB — I-STAT TROPONIN, ED: TROPONIN I, POC: 0 ng/mL (ref 0.00–0.08)

## 2016-11-22 MED ORDER — MORPHINE SULFATE (PF) 4 MG/ML IV SOLN
4.0000 mg | Freq: Once | INTRAVENOUS | Status: AC
  Administered 2016-11-22: 4 mg via INTRAVENOUS
  Filled 2016-11-22: qty 1

## 2016-11-22 MED ORDER — DIAZEPAM 5 MG PO TABS: 5.0000 mg | ORAL_TABLET | Freq: Two times a day (BID) | ORAL | 0 refills | Status: DC

## 2016-11-22 NOTE — ED Notes (Signed)
Patient transported to CT 

## 2016-11-22 NOTE — ED Provider Notes (Signed)
CT chest w/o complications from biopsy.  I will add Valium to pain medication regimen.  Pt knows to return if worse and to f/u with pcp.   Isla Pence, MD 11/22/16 [phone]

## 2016-11-22 NOTE — ED Provider Notes (Signed)
[facility] DEPT Provider Note   CSN: [id number] Arrival date & time: 11/22/16  1350     History   Chief Complaint Chief Complaint  Patient presents with  . Chest Pain    HPI Cindy Bradshaw is a 46 y.o. female.PMH GERD, Asthma, breast cancer s/p chemotherapy and radiation 05/2016, and chronic lower back pain presents with chest pain which began yesterday evening and has gradually worsened this morning. She had CT guided biopsy of her lung two days ago. The pain is a pressure like sensation over the left side of her chest which radiates to her right upper back below her shoulder blade. The pain is associated with nausea. She has a cough which has been on going for months. The pain is worse with coughing, laying on her back, deep breathing, and worsens with eating. The pain does not change with exertion. 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 She has not been on any long car rides or plane rides in the past month.  The pain doesn't feel like asthma exacerbation or GERD.  HPI  Past Medical History:  Diagnosis Date  . Arthritis   . Asthma   . Back disorder    degenerative disk disease  . Breast cancer ([place])   . Cancer ([place])   . GERD (gastroesophageal reflux disease)   . Mammogram abnormal 08/24/15   first  . Migraine   . Pap smear for cervical cancer screening 08/12/15    Patient Active Problem List   Diagnosis Date Noted  . Allergic reaction   . Pruritus 04/23/2016  . Chest pain   . Diaphoresis   . Lightheadedness   . Pain in the chest   . SOB (shortness of breath)   . Abnormal nuclear stress test   . Unstable angina ([place]) 01/21/2016  . Right ankle pain 01/21/2016  . Nausea with vomiting 01/04/2016  . Adnexal cyst: Right per CT 12/09/2015 12/12/2015  . Asthma 12/09/2015  . Abdominal pain 12/09/2015  . Chemotherapy-induced peripheral neuropathy ([place]) 12/05/2015  . Genetic testing 10/13/2015  . Dehydration 09/25/2015  . Family history of  breast cancer in female 09/20/2015  . Normocytic normochromic anemia 09/19/2015  . Breast cancer of upper-outer quadrant of left female breast ([place]) 09/07/2015    Past Surgical History:  Procedure Laterality Date  . BREAST LUMPECTOMY WITH RADIOACTIVE SEED AND SENTINEL LYMPH NODE BIOPSY Left 02/13/2016   Procedure: BREAST LUMPECTOMY WITH RADIOACTIVE SEED AND SENTINEL LYMPH NODE BIOPSY;  Surgeon: Excell Seltzer, MD;  Location: [facility];  Service: General;  Laterality: Left;  . BREAST REDUCTION SURGERY Bilateral 02/21/2016   Procedure: MAMMARY REDUCTION  (BREAST)/Oncoplastic breast reconstruction;  Surgeon: Irene Limbo, MD;  Location: [facility];  Service: Plastics;  Laterality: Bilateral;  . CARDIAC CATHETERIZATION [name] 01/24/2016   Procedure: Left Heart Cath and Coronary Angiography;  Surgeon: Leonie Man, MD;  Location: [facility];  Service: Cardiovascular;  Laterality: [name];  . CESAREAN SECTION    . CHOLECYSTECTOMY    . PERIPHERAL VASCULAR CATHETERIZATION Right 02/13/2016   Procedure: PORTA CATH REMOVAL;  Surgeon: Excell Seltzer, MD;  Location: [facility];  Service: General;  Laterality: Right;  . PORTACATH PLACEMENT [name] 09/12/2015   Procedure: INSERTION PORT-A-CATH;  Surgeon: Excell Seltzer, MD;  Location: [facility];  Service: General;  Laterality: [name];    OB History    Gravida Para Term Preterm AB Living   0 0 0 0 0  SAB TAB Ectopic Multiple Live Births   0 0 0           Home Medications    Prior to Admission medications   Medication Sig Start Date End Date Taking? Authorizing Provider  acetaminophen (TYLENOL) 500 MG tablet Take 1,000 mg by mouth every 6 (six) hours as needed for mild pain, moderate pain or headache.    Historical Provider, MD  albuterol (PROVENTIL HFA;VENTOLIN HFA) 108 (90 Base) MCG/ACT inhaler Inhale 1-2 puffs into the lungs every 6 (six) hours as needed for wheezing or shortness of breath.  07/24/16   Waynetta Pean, PA-C  albuterol (PROVENTIL) (2.5 MG/3ML) 0.083% nebulizer solution Take 2.5 mg by nebulization every 6 (six) hours as needed for wheezing or shortness of breath.    Historical Provider, MD  diphenhydrAMINE (BENADRYL) 25 mg capsule Take 1 capsule (25 mg total) by mouth every 6 (six) hours as needed for itching. Patient taking differently: Take 50 mg by mouth every 6 (six) hours as needed for itching.  02/22/16   Irene Limbo, MD  escitalopram (LEXAPRO) 20 MG tablet Take 20 mg by mouth at bedtime.    Historical Provider, MD  fexofenadine (ALLEGRA) 180 MG tablet Take 180 mg by mouth 2 (two) times daily.    Historical Provider, MD  gabapentin (NEURONTIN) 300 MG capsule Take 3 capsules (900 mg total) by mouth 3 (three) times daily. 07/12/16   Nicholas Lose, MD  HYDROcodone-acetaminophen (NORCO/VICODIN) 5-325 MG tablet Take 1 tablet by mouth every 4 (four) hours as needed. Patient taking differently: Take 1 tablet by mouth every 4 (four) hours as needed for moderate pain.  09/20/16   Isla Pence, MD  HYDROcodone-homatropine [facility]) 5-1.5 MG/5ML syrup Take 5 mLs by mouth every 6 (six) hours as needed for cough. 11/13/16   Nicholas Lose, MD  hydrOXYzine (VISTARIL) 25 MG capsule Take 1 capsule (25 mg total) by mouth at bedtime. 07/13/16   Shaylar Charmian Muff, MD  Melatonin 10 MG TABS Take 10 mg by mouth at bedtime.     Historical Provider, MD  oxyCODONE-acetaminophen (PERCOCET) 5-325 MG tablet Take 2 tablets by mouth every 4 (four) hours as needed. 11/13/16   Charlesetta Shanks, MD  ranitidine (ZANTAC) 150 MG tablet Take 150 mg by mouth 2 (two) times daily.    Historical Provider, MD    Family History Family History  Problem Relation Age of Onset  . Lung cancer Mother 54    2 different types of lung cancer; metastasis to brain; smoker  . Other Mother     hx of hysterectomy for unspecified reason  . Bone cancer Maternal Uncle     dx. early 46s  . Breast cancer Maternal  Grandmother     dx. early 31s, s/p mastectomy  . Cancer Paternal Grandfather     unspecified type of cancer, dx. late 67s  . Congestive Heart Failure Father     smoker  . Stroke Father   . Eczema Father   . Cirrhosis Maternal Grandfather   . Heart Problems Maternal Grandfather   . Asthma Daughter   . Allergies Daughter     hives  . Lung cancer Other     (maternal great uncle; MGM's brother); had a coal stove  . Cancer Cousin     unspecified type; d. early age (paternal first cousin once-removed)  . Cancer Cousin     dx. as a kid; in remission today; (paternal 2nd cousin)  . Cancer Cousin     unspecified type;  d. early 4s; (maternal 1st cousin)    Social History Social History  Substance Use Topics  . Smoking status: Never Smoker  . Smokeless tobacco: Never Used  . Alcohol use 0.0 oz/week     Comment: occassional glass of wine     Allergies   Tape; Dilaudid [hydromorphone hcl]; Ivp dye [iodinated diagnostic agents]; Percocet [oxycodone-acetaminophen]; and Toradol [ketorolac tromethamine]   Review of Systems Review of Systems  Constitutional: Negative for diaphoresis.  Respiratory: Negative for shortness of breath.   Cardiovascular: Positive for chest pain. Negative for palpitations.  Gastrointestinal: Positive for nausea. Negative for abdominal pain.  Musculoskeletal: Positive for back pain.  All other systems reviewed and are negative.    Physical Exam Updated Vital Signs BP 106/66 (BP Location: Right Arm)   Pulse 78   Temp 98.4 F (36.9 C) (Oral)   Resp (!) 22   LMP 10/26/2016   SpO2 98%   Physical Exam  Constitutional: She is oriented to person, place, and time. She appears well-developed and well-nourished. No distress.  Non septic appearing woman in acute pain.   HENT:  Head: Normocephalic and atraumatic.  Eyes: Conjunctivae are normal. No scleral icterus.  Neck: Normal range of motion.  Cardiovascular: Normal rate and regular rhythm.   No murmur  heard. Pulmonary/Chest: Effort normal. No respiratory distress. She has no wheezes. She has no rales.  Lung sounds present in four lung fields   Abdominal: Soft.  Neurological: She is alert and oriented to person, place, and time.  Skin: Skin is warm and dry. She is not diaphoretic.  Psychiatric: She has a normal mood and affect. Her behavior is normal.     ED Treatments / Results  Labs (all labs ordered are listed, but only abnormal results are displayed) Labs Reviewed  CBC WITH DIFFERENTIAL/PLATELET  COMPREHENSIVE METABOLIC PANEL  I-STAT TROPOININ, ED    EKG  EKG Interpretation  Date/Time:  Thursday November 22 2016 14:23:53 EDT Ventricular Rate:  77 PR Interval:    QRS Duration: 81 QT Interval:  388 QTC Calculation: 440 R Axis:   28 Text Interpretation:  Sinus rhythm No significant change since last tracing Confirmed by [facility] MD, Junie Panning ([id number]) on 11/22/2016 2:35:53 PM       Radiology No results found.  Procedures Procedures (including critical care time)  Medications Ordered in ED Medications - No data to display   Initial Impression / Assessment and Plan / ED Course  I have reviewed the triage vital signs and the nursing notes.  Pertinent labs & imaging results that were available during my care of the patient were reviewed by me and considered in my medical decision making (see chart for details).  Chest pain after lung biopsy is most concerning for pneumothorax. Chest xray showed pulmonary nodules unchanged from prior, no pleural effusions or pneumothorax. Called and spoke with interventional radiology who recommended CT chest to rule out hematoma, small pleural effusion or other complication of surgery.    Heart score is low risk for ACS, EKG showed no ST changes or T wave abnormalities in comparison to prior on 3/20. Initial troponin negative.   CT chest revealed no abnormality related to biopsy. She was discharged with valium Rx and instructed to have  close follow up with pcp. Discussed return precautions.  Final Clinical Impressions(s) / ED Diagnoses   Final diagnoses:  None    New Prescriptions New Prescriptions   No medications on file     Ledell Noss, MD 11/23/16 1521  Gareth Morgan, MD 11/23/16 2210

## 2016-11-22 NOTE — ED Triage Notes (Signed)
Pt presents with onset of L sided chest pain after having biopsy from L lung yesterday.  Pt reports pain has worsened today, reports clear sputum with cough, per EMS, pt coughing to the point of gagging with 1 episode of vomiting; zofran 4mg  given PIV.

## 2016-11-22 NOTE — Telephone Encounter (Signed)
Pt calls today regarding concerns about her biopsy results. Pt also experiencing 9/10 cp and sob. Chest pain non radiating. Pt states " I have been coughing but not as much, and nothing is coming out.I have been taking my medication for cough. I am concern that I have this chest pain and would like to see [name]." Advised pt to go to emergency room right now. Pt states " I have not checked my temperature but I am having chills, but not sure if its from the chest pain." Told pt to go to ED right now and get checked out. Biopsy still pending. Will notify [name] and let him know patient symptoms. Pt verbally confirmed that she will be going to the emergency room after getting off the phone. Will follow up with pt once she is out of ED.

## 2016-11-23 ENCOUNTER — Encounter (HOSPITAL_COMMUNITY): Payer: Self-pay

## 2016-11-23 ENCOUNTER — Other Ambulatory Visit: Payer: Self-pay

## 2016-11-23 DIAGNOSIS — C50412 Malignant neoplasm of upper-outer quadrant of left female breast: Secondary | ICD-10-CM

## 2016-11-23 DIAGNOSIS — Z171 Estrogen receptor negative status [ER-]: Secondary | ICD-10-CM

## 2016-11-23 NOTE — Progress Notes (Signed)
Called pt to notify her of appt on Monday at 2:15 lab and 2:30 with [name]. Pt verbally confirmed over the phone.

## 2016-11-25 LAB — AEROBIC/ANAEROBIC CULTURE (SURGICAL/DEEP WOUND)
CULTURE: NO GROWTH
SPECIAL REQUESTS: NORMAL

## 2016-11-25 LAB — AEROBIC/ANAEROBIC CULTURE W GRAM STAIN (SURGICAL/DEEP WOUND)

## 2016-11-26 ENCOUNTER — Ambulatory Visit (HOSPITAL_BASED_OUTPATIENT_CLINIC_OR_DEPARTMENT_OTHER): Payer: Federal, State, Local not specified - PPO | Admitting: Hematology and Oncology

## 2016-11-26 ENCOUNTER — Other Ambulatory Visit (HOSPITAL_BASED_OUTPATIENT_CLINIC_OR_DEPARTMENT_OTHER): Payer: Federal, State, Local not specified - PPO

## 2016-11-26 DIAGNOSIS — C50412 Malignant neoplasm of upper-outer quadrant of left female breast: Secondary | ICD-10-CM

## 2016-11-26 DIAGNOSIS — C771 Secondary and unspecified malignant neoplasm of intrathoracic lymph nodes: Secondary | ICD-10-CM

## 2016-11-26 DIAGNOSIS — C7800 Secondary malignant neoplasm of unspecified lung: Secondary | ICD-10-CM | POA: Diagnosis not present

## 2016-11-26 DIAGNOSIS — Z171 Estrogen receptor negative status [ER-]: Secondary | ICD-10-CM

## 2016-11-26 MED ORDER — CAPECITABINE 500 MG PO TABS: 750.0000 mg/m2 | ORAL_TABLET | Freq: Two times a day (BID) | ORAL | 3 refills | Status: DC

## 2016-11-26 NOTE — Progress Notes (Signed)
Patient Care Team: [name], MD as PCP - General (Internal Medicine)  DIAGNOSIS:  Encounter Diagnosis  Name Primary?  . Malignant neoplasm of upper-outer quadrant of left breast in female, estrogen receptor negative ([place])     SUMMARY OF ONCOLOGIC HISTORY:   Breast cancer of upper-outer quadrant of left female breast ([place])   08/17/2015 Initial Diagnosis    left breast biopsy 1:30 position: Invasive ductal carcinoma, grade 3, ER 0%, PR 0%, HER-2 negative ratio 1.48, Ki-67 90%, 3.1 cm tumor, axilla negative, T2 N0 stage II a clinical stage      09/19/2015 - 01/03/2016 Neo-Adjuvant Chemotherapy    Neoadjuvant chemotherapy with dose dense Adriamycin and Cytoxan 4 followed by Carbo/Taxol x 3 cycles (Carbo d/c'd after cycle #3 d/t hospitalization for N&V/dehydration). Went on to complete additional 3 cycles of Taxol alone.       09/28/2015 Procedure     Left breast biopsy anterior third left upper outer quadrant: PASH;  upper inner quadrant: [facility]      10/06/2015 Procedure    Genetic testing: Negative. Genes analyzed: ATM, BARD1, BRCA1, BRCA2, BRIP1, CDH1, CHEK2, FANCC, MLH1, MSH2, MSH6, NBN, PALB2, PMS2, PTEN, RAD51C, RAD51D, TP53, and XRCC2.  This panel also includes deletion/duplication analysis (without sequencing) for one gene, EPCAM      01/16/2016 Breast MRI    significant response to chemotherapy with decrease in the tumor from 5.1 cm to 2.9 cm, also decrease a non-mass enhancement      02/13/2016 Surgery    Left lumpectomy ([place]): IDC grade 3, 3.3 cm, margins negative, 0/3 lymph nodes negative, triple negative, T2 N0 stage II a pathologic stage      02/21/2016 Surgery    Bilateral breast mammoplasty ([place]); no malignancy on path      03/26/2016 - 05/10/2016 Radiation Therapy    Adjuvant radiation therapy Lisbeth Renshaw). Left breast: 50.4 Gy in 28 fractions. Left breast boost: 10 Gy in 5 fractions.       11/13/2016 Imaging    CT chest: Extensive adenopathy  throughout anterior mediastinum largest 2.6 cm,Ln at aortic arch 2.8 cm, subcarinal LN 2.8 cm, left hilar LN 1.6 cm, right hilar LN 1.8 cm, lung nodules bilaterally largest right lung 2.6 cm      11/20/2016 Relapse/Recurrence    Lung biopsy left lower quadrant: Metastatic carcinoma consistent with breast       CHIEF COMPLIANT: Follow-up to discuss a lung biopsy results  INTERVAL HISTORY: Cindy Bradshaw is a 46 year old with above-mentioned history of triple negative breast cancer who presented with multiple bilateral lung nodules and mediastinal lymphadenopathy. She underwent a lung biopsy which revealed metastatic carcinoma consistent with breast primary. Prognostic markers are pending. She is here today to discuss the results of the biopsy and treatment plan. She complains of cough and shortness of breath to minimal exertion. Patient has been going through a divorce and she is also responsible for her children. She is extremely worried and frustrated. She is under a lot of stress. She was emotional today and tearful as well.  REVIEW OF SYSTEMS:   Constitutional: Denies fevers, chills or abnormal weight loss Eyes: Denies blurriness of vision Ears, nose, mouth, throat, and face: Denies mucositis or sore throat Respiratory: Chronic dry cough and shortness of breath to minimal exertion Cardiovascular: Denies palpitation, chest discomfort Gastrointestinal:  Denies nausea, heartburn or change in bowel habits Skin: Denies abnormal skin rashes Lymphatics: Denies new lymphadenopathy or easy bruising Neurological:Denies numbness, tingling or new weaknesses Behavioral/Psych: Tearfulness  Extremities:  No lower extremity edema  All other systems were reviewed with the patient and are negative.  I have reviewed the past medical history, past surgical history, social history and family history with the patient and they are unchanged from previous note.  ALLERGIES:  is allergic to dilaudid  [hydromorphone hcl]; ivp dye [iodinated diagnostic agents]; percocet [oxycodone-acetaminophen]; tape; and toradol [ketorolac tromethamine].  MEDICATIONS:  Current Outpatient Prescriptions  Medication Sig Dispense Refill  . acetaminophen (TYLENOL) 500 MG tablet Take 1,000 mg by mouth every 6 (six) hours as needed for mild pain, moderate pain or headache.    . albuterol (PROVENTIL HFA;VENTOLIN HFA) 108 (90 Base) MCG/ACT inhaler Inhale 1-2 puffs into the lungs every 6 (six) hours as needed for wheezing or shortness of breath. 1 Inhaler 0  . albuterol (PROVENTIL) (2.5 MG/3ML) 0.083% nebulizer solution Take 2.5 mg by nebulization every 6 (six) hours as needed for wheezing or shortness of breath.    . capecitabine (XELODA) 500 MG tablet Take 3 tablets (1,500 mg total) by mouth 2 (two) times daily after a meal. 14 days on 7 days off 84 tablet 3  . diazepam (VALIUM) 5 MG tablet Take 1 tablet (5 mg total) by mouth 2 (two) times daily. 10 tablet 0  . diphenhydrAMINE (BENADRYL) 25 mg capsule Take 1 capsule (25 mg total) by mouth every 6 (six) hours as needed for itching. (Patient taking differently: Take 50 mg by mouth every 6 (six) hours as needed for itching. ) 30 capsule 0  . escitalopram (LEXAPRO) 20 MG tablet Take 20 mg by mouth at bedtime.    . fexofenadine (ALLEGRA) 180 MG tablet Take 180 mg by mouth 2 (two) times daily.    Marland Kitchen gabapentin (NEURONTIN) 300 MG capsule Take 3 capsules (900 mg total) by mouth 3 (three) times daily. 180 capsule 2  . HYDROcodone-acetaminophen (NORCO/VICODIN) 5-325 MG tablet Take 1 tablet by mouth every 4 (four) hours as needed. (Patient taking differently: Take 1 tablet by mouth every 4 (four) hours as needed for moderate pain. ) 10 tablet 0  . HYDROcodone-homatropine (HYCODAN) 5-1.5 MG/5ML syrup Take 5 mLs by mouth every 6 (six) hours as needed for cough. 120 mL 0  . hydrOXYzine (VISTARIL) 25 MG capsule Take 1 capsule (25 mg total) by mouth at bedtime. 90 capsule 0  . Melatonin  10 MG TABS Take 10 mg by mouth at bedtime.     Marland Kitchen oxyCODONE-acetaminophen (PERCOCET) 5-325 MG tablet Take 2 tablets by mouth every 4 (four) hours as needed. (Patient taking differently: Take 2 tablets by mouth every 4 (four) hours as needed for moderate pain. ) 20 tablet 0  . ranitidine (ZANTAC) 150 MG tablet Take 150 mg by mouth 2 (two) times daily.     No current facility-administered medications for this visit.    Facility-Administered Medications Ordered in Other Visits  Medication Dose Route Frequency Provider Last Rate Last Dose  . 0.9 %  sodium chloride infusion   Intravenous Once Laurie Panda, NP        PHYSICAL EXAMINATION: ECOG PERFORMANCE STATUS: 1 - Symptomatic but completely ambulatory  Vitals:   11/26/16 1448  BP: 107/71  Pulse: 96  Resp: 19  Temp: 98.2 F (36.8 C)   Filed Weights   11/26/16 1448  Weight: 224 lb 9.6 oz (101.9 kg)    GENERAL:alert, no distress and comfortable SKIN: skin color, texture, turgor are normal, no rashes or significant lesions EYES: normal, Conjunctiva are pink and non-injected, sclera clear OROPHARYNX:no exudate,  no erythema and lips, buccal mucosa, and tongue normal  NECK: supple, thyroid normal size, non-tender, without nodularity LYMPH:  no palpable lymphadenopathy in the cervical, axillary or inguinal LUNGS: Diminished breath sounds at the lung bases HEART: regular rate & rhythm and no murmurs and no lower extremity edema ABDOMEN:abdomen soft, non-tender and normal bowel sounds MUSCULOSKELETAL:no cyanosis of digits and no clubbing  NEURO: alert & oriented x 3 with fluent speech, no focal motor/sensory deficits EXTREMITIES: No lower extremity edema  LABORATORY DATA:  I have reviewed the data as listed   Chemistry      Component Value Date/Time   NA 136 11/22/2016 1440   NA 140 04/10/2016 1224   K 5.2 (H) 11/22/2016 1440   K 3.4 (L) 04/10/2016 1224   CL 105 11/22/2016 1440   CO2 23 11/22/2016 1440   CO2 27 04/10/2016  1224   BUN 12 11/22/2016 1440   BUN 10.0 04/10/2016 1224   CREATININE 0.65 11/22/2016 1440   CREATININE 0.8 04/10/2016 1224      Component Value Date/Time   CALCIUM 9.9 11/22/2016 1440   CALCIUM 10.1 04/10/2016 1224   ALKPHOS 71 11/22/2016 1440   ALKPHOS 56 01/10/2016 1142   AST 35 11/22/2016 1440   AST 35 (H) 01/10/2016 1142   ALT 27 11/22/2016 1440   ALT 57 (H) 01/10/2016 1142   BILITOT 1.5 (H) 11/22/2016 1440   BILITOT 0.48 01/10/2016 1142       Lab Results  Component Value Date   WBC 5.8 11/22/2016   HGB 11.6 (L) 11/22/2016   HCT 36.3 11/22/2016   MCV 87.5 11/22/2016   PLT 371 11/22/2016   NEUTROABS 3.8 11/22/2016    ASSESSMENT & PLAN:  Breast cancer of upper-outer quadrant of left female breast ([place]) Left lumpectomy 02/13/2016: IDC grade 3, 3.3 cm, margins negative, 0/3 lymph nodes negative, triple negative, T2 N0 stage II a pathologic stage  Left breast biopsy 08/17/2015:1:30 position: Invasive ductal carcinoma, grade 3, ER 0%, PR 0%, HER-2 negative ratio 1.48, Ki-67 90%, 3.1 cm tumor, axilla negative, T2 N0 stage II a clinical stage Breast biopsies x 2: 09/28/2015 : [place]  Treatment summary: 1. Completed Cycle 4 dose dense Adriamycin and Cytoxan on 10/31/15; completed cycle 3 Taxol and carboplatin, completed 7 cycles of Taxol and treatment discontinued for poor tolerance. 2. left lumpectomy 02/13/2016  3. Adjuvant radiation 03/26/2016 to09/07/2016 Patient was offered treatment with Xeloda as well as immunotherapy clinical trial. She refused both of those options. ----------------------------------------------------------------------------------------------------------------------------------------------------- Relapse: CT chest 11/13/2016 revealed multiple lung nodules and mediastinal lymph nodes 11/20/2016: Lung biopsy: Metastatic breast cancer prognostic panel is pending  Plan: Patient will need systemic chemotherapy. I discussed different treatment options  and recommended starting the patient on Xeloda. I discussed risks and benefits of Xeloda including the risk of diarrhea and hand-foot syndrome. I recommend sending the tumor for Foundation 1 testing to look for any actionable mutations. Blood will also be sent for PCR for MSI BRCA mutation testing was done previously and was negative.  Return to clinic in 3 weeks to assess toxicities of Xeloda.   I spent 25 minutes talking to the patient of which more than half was spent in counseling and coordination of care.  Orders Placed This Encounter  Procedures  . NM PET Image Restag (PS) Skull Base To Thigh    Standing Status:   Future    Standing Expiration Date:   11/26/2017    Order Specific Question:   Reason for Exam (SYMPTOM  OR DIAGNOSIS REQUIRED)    Answer:   Metastatic breast cancer newly diagnosed    Order Specific Question:   If indicated for the ordered procedure, I authorize the administration of a radiopharmaceutical per Radiology protocol    Answer:   Yes    Order Specific Question:   Is the patient pregnant?    Answer:   No    Order Specific Question:   Preferred imaging location?    Answer:   [facility]    Order Specific Question:   Radiology Contrast Protocol - do NOT remove file path    Answer:   [email or web address]  . CBC with Differential    Standing Status:   Future    Standing Expiration Date:   11/26/2017  . Comprehensive metabolic panel    Standing Status:   Future    Standing Expiration Date:   11/26/2017   The patient has a good understanding of the overall plan. she agrees with it. she will call with any problems that may develop before the next visit here.   Rulon Eisenmenger, MD 11/26/16

## 2016-11-26 NOTE — Assessment & Plan Note (Signed)
Left lumpectomy 02/13/2016: IDC grade 3, 3.3 cm, margins negative, 0/3 lymph nodes negative, triple negative, T2 N0 stage II a pathologic stage  Left breast biopsy 08/17/2015:1:30 position: Invasive ductal carcinoma, grade 3, ER 0%, PR 0%, HER-2 negative ratio 1.48, Ki-67 90%, 3.1 cm tumor, axilla negative, T2 N0 stage II a clinical stage Breast biopsies x 2: 09/28/2015 : [place]  Treatment summary: 1. Completed Cycle 4 dose dense Adriamycin and Cytoxan on 10/31/15; completed cycle 3 Taxol and carboplatin, completed 7 cycles of Taxol and treatment discontinued for poor tolerance. 2. left lumpectomy 02/13/2016  3. Adjuvant radiation 03/26/2016 to09/07/2016 Patient was offered treatment with Xeloda as well as immunotherapy clinical trial. She refused both of those options. ----------------------------------------------------------------------------------------------------------------------------------------------------- Relapse: CT chest 11/13/2016 revealed multiple lung nodules and mediastinal lymph nodes 11/20/2016: Lung biopsy: Metastatic breast cancer prognostic panel is pending  Plan: Patient will need systemic chemotherapy. I discussed different treatment options and recommended starting the patient on Xeloda. I discussed risks and benefits of Xeloda including the risk of diarrhea and hand-foot syndrome.  Return to clinic in 3 weeks to assess toxicities of Xeloda.

## 2016-11-27 ENCOUNTER — Other Ambulatory Visit (HOSPITAL_COMMUNITY)
Admission: RE | Admit: 2016-11-27 | Discharge: 2016-11-27 | Disposition: A | Discharge status: Home / Self Care | Payer: Federal, State, Local not specified - PPO | Source: Ambulatory Visit | Attending: Hematology and Oncology | Admitting: Hematology and Oncology

## 2016-11-27 ENCOUNTER — Other Ambulatory Visit: Payer: Self-pay | Admitting: Emergency Medicine

## 2016-11-27 ENCOUNTER — Telehealth: Payer: Self-pay | Admitting: Pharmacist

## 2016-11-27 ENCOUNTER — Telehealth: Payer: Self-pay | Admitting: Emergency Medicine

## 2016-11-27 DIAGNOSIS — Z171 Estrogen receptor negative status [ER-]: Secondary | ICD-10-CM | POA: Diagnosis not present

## 2016-11-27 DIAGNOSIS — C50412 Malignant neoplasm of upper-outer quadrant of left female breast: Secondary | ICD-10-CM | POA: Insufficient documentation

## 2016-11-27 MED ORDER — CAPECITABINE 500 MG PO TABS: 750.0000 mg/m2 | ORAL_TABLET | Freq: Two times a day (BID) | ORAL | 3 refills | Status: DC

## 2016-11-27 NOTE — Telephone Encounter (Signed)
Oral Chemotherapy Pharmacist Encounter  Received notification from [facility] that they had received a new prescription for Xeloda for patient. 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  I called the FEP team at [facility] at [phone] to give verbal prescription for Xeloda. I was able to speak with a pharmacist and provided Xeloda prescription and additional information to set-up account for patient.  [facility] will process prescription and call office with any issues. They will then call patient when prescription is ready to be shipped.  [facility] Oncology Clinic will continue to follow.  Johny Drilling, PharmD, BCPS, BCOP 11/27/2016  5:04 PM Oral Oncology Clinic [phone]

## 2016-11-27 NOTE — Telephone Encounter (Signed)
Spoke with Varney Biles in Pathology; she will send tumor off for [facility]; states there will be a delay to this testing due to specimen amount and one block already sent for other testing.  Dr Lindi Adie aware.

## 2016-11-28 ENCOUNTER — Telehealth: Payer: Self-pay

## 2016-11-28 ENCOUNTER — Telehealth: Payer: Self-pay | Admitting: Emergency Medicine

## 2016-11-28 ENCOUNTER — Encounter: Payer: Self-pay | Admitting: Emergency Medicine

## 2016-11-28 ENCOUNTER — Institutional Professional Consult (permissible substitution): Payer: Federal, State, Local not specified - PPO | Admitting: Pulmonary Disease

## 2016-11-28 NOTE — Telephone Encounter (Signed)
Letter and FMLA papers faxed to patient's employer per her request.

## 2016-11-28 NOTE — Telephone Encounter (Signed)
Pt called asking if the letters she requested are ready. She said she left a note telling what needs to be in the letters.

## 2016-11-29 ENCOUNTER — Encounter: Payer: Self-pay | Admitting: Radiation Oncology

## 2016-11-29 NOTE — Progress Notes (Signed)
[employer] requested office notes and dose records, gave to [place] to give to [place] and Dose for records 3/29

## 2016-12-03 ENCOUNTER — Telehealth: Payer: Self-pay | Admitting: *Deleted

## 2016-12-03 NOTE — Telephone Encounter (Signed)
On 12-03-16 fax medical records to [facility] it was consult note, end of tx note, sim & planning note, follow up note, and dos will do there part.

## 2016-12-04 ENCOUNTER — Ambulatory Visit (HOSPITAL_COMMUNITY)
Admission: RE | Admit: 2016-12-04 | Discharge: 2016-12-04 | Disposition: A | Discharge status: Home / Self Care | Payer: Federal, State, Local not specified - PPO | Source: Ambulatory Visit | Attending: Hematology and Oncology | Admitting: Hematology and Oncology

## 2016-12-04 DIAGNOSIS — C771 Secondary and unspecified malignant neoplasm of intrathoracic lymph nodes: Secondary | ICD-10-CM | POA: Diagnosis not present

## 2016-12-04 DIAGNOSIS — Z171 Estrogen receptor negative status [ER-]: Secondary | ICD-10-CM | POA: Diagnosis not present

## 2016-12-04 DIAGNOSIS — C50412 Malignant neoplasm of upper-outer quadrant of left female breast: Secondary | ICD-10-CM | POA: Insufficient documentation

## 2016-12-04 DIAGNOSIS — R918 Other nonspecific abnormal finding of lung field: Secondary | ICD-10-CM | POA: Diagnosis not present

## 2016-12-04 DIAGNOSIS — R937 Abnormal findings on diagnostic imaging of other parts of musculoskeletal system: Secondary | ICD-10-CM | POA: Diagnosis not present

## 2016-12-04 LAB — GLUCOSE, CAPILLARY: Glucose-Capillary: 87 mg/dL (ref 65–99)

## 2016-12-04 MED ORDER — FLUDEOXYGLUCOSE F - 18 (FDG) INJECTION: 11.6600 | Freq: Once | INTRAVENOUS | Status: DC | PRN

## 2016-12-06 ENCOUNTER — Telehealth: Payer: Self-pay | Admitting: Hematology and Oncology

## 2016-12-06 NOTE — Telephone Encounter (Signed)
Faxed records to [place]

## 2016-12-11 ENCOUNTER — Other Ambulatory Visit (HOSPITAL_COMMUNITY)
Admission: RE | Admit: 2016-12-11 | Discharge: 2016-12-11 | Disposition: A | Discharge status: Home / Self Care | Payer: Federal, State, Local not specified - PPO | Source: Ambulatory Visit | Attending: Hematology and Oncology | Admitting: Hematology and Oncology

## 2016-12-11 DIAGNOSIS — C801 Malignant (primary) neoplasm, unspecified: Secondary | ICD-10-CM | POA: Diagnosis present

## 2016-12-11 LAB — CULTURE, FUNGUS WITHOUT SMEAR

## 2016-12-12 ENCOUNTER — Telehealth: Payer: Self-pay | Admitting: Hematology and Oncology

## 2016-12-12 NOTE — Telephone Encounter (Signed)
Spoke with patient re appointment for 4/136 @ 2:15 pm

## 2016-12-14 ENCOUNTER — Ambulatory Visit (HOSPITAL_BASED_OUTPATIENT_CLINIC_OR_DEPARTMENT_OTHER): Payer: Federal, State, Local not specified - PPO | Admitting: Hematology and Oncology

## 2016-12-14 DIAGNOSIS — R0602 Shortness of breath: Secondary | ICD-10-CM | POA: Diagnosis not present

## 2016-12-14 DIAGNOSIS — C7800 Secondary malignant neoplasm of unspecified lung: Secondary | ICD-10-CM

## 2016-12-14 DIAGNOSIS — R05 Cough: Secondary | ICD-10-CM

## 2016-12-14 DIAGNOSIS — C7951 Secondary malignant neoplasm of bone: Secondary | ICD-10-CM

## 2016-12-14 DIAGNOSIS — C50412 Malignant neoplasm of upper-outer quadrant of left female breast: Secondary | ICD-10-CM | POA: Diagnosis not present

## 2016-12-14 DIAGNOSIS — Z171 Estrogen receptor negative status [ER-]: Secondary | ICD-10-CM | POA: Diagnosis not present

## 2016-12-14 DIAGNOSIS — C778 Secondary and unspecified malignant neoplasm of lymph nodes of multiple regions: Secondary | ICD-10-CM

## 2016-12-14 NOTE — Assessment & Plan Note (Signed)
Left lumpectomy 02/13/2016: IDC grade 3, 3.3 cm, margins negative, 0/3 lymph nodes negative, triple negative, T2 N0 stage II a pathologic stage  Left breast biopsy 08/17/2015:1:30 position: Invasive ductal carcinoma, grade 3, ER 0%, PR 0%, HER-2 negative ratio 1.48, Ki-67 90%, 3.1 cm tumor, axilla negative, T2 N0 stage II a clinical stage Breast biopsies x 2: 09/28/2015 : [place]  Treatment summary: 1. Completed Cycle 4 dose dense Adriamycin and Cytoxan on 10/31/15; completed cycle 3 Taxol and carboplatin, completed 7 cycles of Taxol and treatment discontinued for poor tolerance. 2. left lumpectomy 02/13/2016  3. Adjuvant radiation 03/26/2016 to09/07/2016 Patient was offered treatment with Xeloda as well as immunotherapy clinical trial. She refused both of those options. ----------------------------------------------------------------------------------------------------------------------------------------------------- Relapse: CT chest 11/13/2016 revealed multiple lung nodules and mediastinal lymph nodes 11/20/2016: Lung biopsy: Metastatic breast cancer triple negative PET/CT scan 12/04/2016: Multiple bilateral lung nodules or masses, 12 mm LUL, to 3 cm masses RML, 2.3 cm posterior left lung base, extensive thoracic lymphadenopathy 2.9 cm prevascular, 2.2 cm subcarinal, 2 cm left hilar, 1.5 cm right infrahilar, 1.5 cm right IMA , additional bone metastases right acetabulum  Current treatment: Xeloda  2 weeks on one week off Return to clinic to evaluate toxicities to Xeloda

## 2016-12-16 NOTE — Progress Notes (Signed)
Patient Care Team: [name], MD as PCP - General (Internal Medicine)  DIAGNOSIS:  Encounter Diagnosis  Name Primary?  . Malignant neoplasm of upper-outer quadrant of left breast in female, estrogen receptor negative ([place])     SUMMARY OF ONCOLOGIC HISTORY:   Breast cancer of upper-outer quadrant of left female breast ([place])   08/17/2015 Initial Diagnosis    left breast biopsy 1:30 position: Invasive ductal carcinoma, grade 3, ER 0%, PR 0%, HER-2 negative ratio 1.48, Ki-67 90%, 3.1 cm tumor, axilla negative, T2 N0 stage II a clinical stage      09/19/2015 - 01/03/2016 Neo-Adjuvant Chemotherapy    Neoadjuvant chemotherapy with dose dense Adriamycin and Cytoxan 4 followed by Carbo/Taxol x 3 cycles (Carbo d/c'd after cycle #3 d/t hospitalization for N&V/dehydration). Went on to complete additional 3 cycles of Taxol alone.       09/28/2015 Procedure     Left breast biopsy anterior third left upper outer quadrant: [facility];  upper inner quadrant: [place]      10/06/2015 Procedure    Genetic testing: Negative. Genes analyzed: ATM, BARD1, BRCA1, BRCA2, BRIP1, CDH1, CHEK2, FANCC, MLH1, MSH2, MSH6, NBN, PALB2, PMS2, PTEN, RAD51C, RAD51D, TP53, and XRCC2.  This panel also includes deletion/duplication analysis (without sequencing) for one gene, EPCAM      01/16/2016 Breast MRI    significant response to chemotherapy with decrease in the tumor from 5.1 cm to 2.9 cm, also decrease a non-mass enhancement      02/13/2016 Surgery    Left lumpectomy ([place]): IDC grade 3, 3.3 cm, margins negative, 0/3 lymph nodes negative, triple negative, T2 N0 stage II a pathologic stage      02/21/2016 Surgery    Bilateral breast mammoplasty ([place]); no malignancy on path      03/26/2016 - 05/10/2016 Radiation Therapy    Adjuvant radiation therapy Lisbeth Renshaw). Left breast: 50.4 Gy in 28 fractions. Left breast boost: 10 Gy in 5 fractions.       11/13/2016 Imaging    CT chest: Extensive adenopathy  throughout anterior mediastinum largest 2.6 cm,Ln at aortic arch 2.8 cm, subcarinal LN 2.8 cm, left hilar LN 1.6 cm, right hilar LN 1.8 cm, lung nodules bilaterally largest right lung 2.6 cm      11/20/2016 Relapse/Recurrence    Lung biopsy left lower quadrant: Metastatic carcinoma consistent with breast      12/04/2016 PET scan    Multiple bilateral lung nodules or masses, 12 mm LUL, to 3 cm masses RML, 2.3 cm posterior left lung base, extensive thoracic lymphadenopathy 2.9 cm prevascular, 2.2 cm subcarinal, 2 cm left hilar, 1.5 cm right infrahilar, 1.5 cm right IMA , additional bone metastases right acetabulum       CHIEF COMPLIANT: Folow Up to discuss scans  INTERVAL HISTORY: Cindy Bradshaw is a 45 yr old with metastatic breast cancer who is here to discuss the recent PET CT scan. She was prescribed Xeloda but she hasnt started yet. Shes complaining of shortness of breath and a persistent cough.  REVIEW OF SYSTEMS:   Constitutional: Denies fevers, chills or abnormal weight loss Eyes: Denies blurriness of vision Ears, nose, mouth, throat, and face: Denies mucositis or sore throat Respiratory: persistant cough, C/O dyspnea Cardiovascular: Denies palpitation, chest discomfort Gastrointestinal:  Denies nausea, heartburn or change in bowel habits Skin: Denies abnormal skin rashes Lymphatics: Denies new lymphadenopathy or easy bruising Neurological:Denies numbness, tingling or new weaknesses Behavioral/Psych: Mood is stable, no new changes  Extremities: No lower extremity edema All other  systems were reviewed with the patient and are negative.  I have reviewed the past medical history, past surgical history, social history and family history with the patient and they are unchanged from previous note.  ALLERGIES:  is allergic to dilaudid [hydromorphone hcl]; ivp dye [iodinated diagnostic agents]; percocet [oxycodone-acetaminophen]; tape; and toradol [ketorolac  tromethamine].  MEDICATIONS:  Current Outpatient Prescriptions  Medication Sig Dispense Refill  . acetaminophen (TYLENOL) 500 MG tablet Take 1,000 mg by mouth every 6 (six) hours as needed for mild pain, moderate pain or headache.    . albuterol (PROVENTIL HFA;VENTOLIN HFA) 108 (90 Base) MCG/ACT inhaler Inhale 1-2 puffs into the lungs every 6 (six) hours as needed for wheezing or shortness of breath. 1 Inhaler 0  . albuterol (PROVENTIL) (2.5 MG/3ML) 0.083% nebulizer solution Take 2.5 mg by nebulization every 6 (six) hours as needed for wheezing or shortness of breath.    . capecitabine (XELODA) 500 MG tablet Take 3 tablets (1,500 mg total) by mouth 2 (two) times daily after a meal. 14 days on 7 days off 84 tablet 3  . diazepam (VALIUM) 5 MG tablet Take 1 tablet (5 mg total) by mouth 2 (two) times daily. 10 tablet 0  . diphenhydrAMINE (BENADRYL) 25 mg capsule Take 1 capsule (25 mg total) by mouth every 6 (six) hours as needed for itching. (Patient taking differently: Take 50 mg by mouth every 6 (six) hours as needed for itching. ) 30 capsule 0  . escitalopram (LEXAPRO) 20 MG tablet Take 20 mg by mouth at bedtime.    . fexofenadine (ALLEGRA) 180 MG tablet Take 180 mg by mouth 2 (two) times daily.    Marland Kitchen gabapentin (NEURONTIN) 300 MG capsule Take 3 capsules (900 mg total) by mouth 3 (three) times daily. 180 capsule 2  . HYDROcodone-acetaminophen (NORCO/VICODIN) 5-325 MG tablet Take 1 tablet by mouth every 4 (four) hours as needed. (Patient taking differently: Take 1 tablet by mouth every 4 (four) hours as needed for moderate pain. ) 10 tablet 0  . HYDROcodone-homatropine (HYCODAN) 5-1.5 MG/5ML syrup Take 5 mLs by mouth every 6 (six) hours as needed for cough. 120 mL 0  . hydrOXYzine (VISTARIL) 25 MG capsule Take 1 capsule (25 mg total) by mouth at bedtime. 90 capsule 0  . Melatonin 10 MG TABS Take 10 mg by mouth at bedtime.     Marland Kitchen oxyCODONE-acetaminophen (PERCOCET) 5-325 MG tablet Take 2 tablets by mouth  every 4 (four) hours as needed. (Patient taking differently: Take 2 tablets by mouth every 4 (four) hours as needed for moderate pain. ) 20 tablet 0  . ranitidine (ZANTAC) 150 MG tablet Take 150 mg by mouth 2 (two) times daily.     No current facility-administered medications for this visit.    Facility-Administered Medications Ordered in Other Visits  Medication Dose Route Frequency Provider Last Rate Last Dose  . 0.9 %  sodium chloride infusion   Intravenous Once Laurie Panda, NP        PHYSICAL EXAMINATION: ECOG PERFORMANCE STATUS: 2 - Symptomatic, <50% confined to bed  Vitals:   12/14/16 1440  BP: 124/83  Pulse: (!) 102  Resp: 20  Temp: 98.6 F (37 C)   Filed Weights   12/14/16 1440  Weight: 219 lb 11.2 oz (99.7 kg)    GENERAL:alert, no distress and comfortable SKIN: skin color, texture, turgor are normal, no rashes or significant lesions EYES: normal, Conjunctiva are pink and non-injected, sclera clear OROPHARYNX:no exudate, no erythema and lips, buccal  mucosa, and tongue normal  NECK: supple, thyroid normal size, non-tender, without nodularity LYMPH:  no palpable lymphadenopathy in the cervical, axillary or inguinal LUNGS: Wheezes HEART: regular rate & rhythm and no murmurs and no lower extremity edema ABDOMEN:abdomen soft, non-tender and normal bowel sounds MUSCULOSKELETAL:no cyanosis of digits and no clubbing  NEURO: alert & oriented x 3 with fluent speech, no focal motor/sensory deficits EXTREMITIES: No lower extremity edema  LABORATORY DATA:  I have reviewed the data as listed   Chemistry      Component Value Date/Time   NA 136 11/22/2016 1440   NA 140 04/10/2016 1224   K 5.2 (H) 11/22/2016 1440   K 3.4 (L) 04/10/2016 1224   CL 105 11/22/2016 1440   CO2 23 11/22/2016 1440   CO2 27 04/10/2016 1224   BUN 12 11/22/2016 1440   BUN 10.0 04/10/2016 1224   CREATININE 0.65 11/22/2016 1440   CREATININE 0.8 04/10/2016 1224      Component Value Date/Time    CALCIUM 9.9 11/22/2016 1440   CALCIUM 10.1 04/10/2016 1224   ALKPHOS 71 11/22/2016 1440   ALKPHOS 56 01/10/2016 1142   AST 35 11/22/2016 1440   AST 35 (H) 01/10/2016 1142   ALT 27 11/22/2016 1440   ALT 57 (H) 01/10/2016 1142   BILITOT 1.5 (H) 11/22/2016 1440   BILITOT 0.48 01/10/2016 1142       Lab Results  Component Value Date   WBC 5.8 11/22/2016   HGB 11.6 (L) 11/22/2016   HCT 36.3 11/22/2016   MCV 87.5 11/22/2016   PLT 371 11/22/2016   NEUTROABS 3.8 11/22/2016    ASSESSMENT & PLAN:  Breast cancer of upper-outer quadrant of left female breast ([place]) Left lumpectomy 02/13/2016: IDC grade 3, 3.3 cm, margins negative, 0/3 lymph nodes negative, triple negative, T2 N0 stage II a pathologic stage  Left breast biopsy 08/17/2015:1:30 position: Invasive ductal carcinoma, grade 3, ER 0%, PR 0%, HER-2 negative ratio 1.48, Ki-67 90%, 3.1 cm tumor, axilla negative, T2 N0 stage II a clinical stage Breast biopsies x 2: 09/28/2015 : [place]  Treatment summary: 1. Completed Cycle 4 dose dense Adriamycin and Cytoxan on 10/31/15; completed cycle 3 Taxol and carboplatin, completed 7 cycles of Taxol and treatment discontinued for poor tolerance. 2. left lumpectomy 02/13/2016  3. Adjuvant radiation 03/26/2016 to09/07/2016 Patient was offered treatment with Xeloda as well as immunotherapy clinical trial. She refused both of those options. ----------------------------------------------------------------------------------------------------------------------------------------------------- Relapse: CT chest 11/13/2016 revealed multiple lung nodules and mediastinal lymph nodes 11/20/2016: Lung biopsy: Metastatic breast cancer triple negative PET/CT scan 12/04/2016: Multiple bilateral lung nodules or masses, 12 mm LUL, to 3 cm masses RML, 2.3 cm posterior left lung base, extensive thoracic lymphadenopathy 2.9 cm prevascular, 2.2 cm subcarinal, 2 cm left hilar, 1.5 cm right infrahilar, 1.5 cm right IMA ,  additional bone metastases right acetabulum  Current treatment: Xeloda  2 weeks on one week off started 12/14/16 Return to clinic to evaluate toxicities to Xeloda in 1 week   I spent 25 minutes talking to the patient of which more than half was spent in counseling and coordination of care.  Orders Placed This Encounter  Procedures  . CBC with Differential    Standing Status:   Future    Standing Expiration Date:   12/14/2017  . Comprehensive metabolic panel    Standing Status:   Future    Standing Expiration Date:   12/14/2017   The patient has a good understanding of the overall plan. she agrees  with it. she will call with any problems that may develop before the next visit here.   Rulon Eisenmenger, MD 12/16/16

## 2016-12-20 ENCOUNTER — Other Ambulatory Visit: Payer: Self-pay | Admitting: Emergency Medicine

## 2016-12-20 MED ORDER — HYDROCODONE-HOMATROPINE 5-1.5 MG/5ML PO SYRP: 5.0000 mL | ORAL_SOLUTION | Freq: Four times a day (QID) | ORAL | 0 refills | Status: DC | PRN

## 2016-12-21 ENCOUNTER — Other Ambulatory Visit: Payer: Federal, State, Local not specified - PPO

## 2016-12-25 ENCOUNTER — Telehealth: Payer: Self-pay | Admitting: Hematology and Oncology

## 2016-12-25 ENCOUNTER — Ambulatory Visit: Payer: Federal, State, Local not specified - PPO | Admitting: Hematology and Oncology

## 2016-12-25 ENCOUNTER — Other Ambulatory Visit: Payer: Federal, State, Local not specified - PPO

## 2016-12-25 NOTE — Assessment & Plan Note (Deleted)
Left lumpectomy 02/13/2016: IDC grade 3, 3.3 cm, margins negative, 0/3 lymph nodes negative, triple negative, T2 N0 stage II a pathologic stage  Left breast biopsy 08/17/2015:1:30 position: Invasive ductal carcinoma, grade 3, ER 0%, PR 0%, HER-2 negative ratio 1.48, Ki-67 90%, 3.1 cm tumor, axilla negative, T2 N0 stage II a clinical stage Breast biopsies x 2: 09/28/2015 : [place]  Treatment summary: 1. Completed Cycle 4 dose dense Adriamycin and Cytoxan on 10/31/15; completed cycle 3 Taxol and carboplatin, completed 7 cycles of Taxol and treatment discontinued for poor tolerance. 2. left lumpectomy 02/13/2016  3. Adjuvant radiation 03/26/2016 to09/07/2016 Patient was offered treatment with Xeloda as well as immunotherapy clinical trial. She refused both of those options. ----------------------------------------------------------------------------------------------------------------------------------------------------- Relapse: CT chest 11/13/2016 revealed multiple lung nodules and mediastinal lymph nodes 11/20/2016: Lung biopsy: Metastatic breast cancer triple negative PET/CT scan 12/04/2016: Multiple bilateral lung nodules or masses, 12 mm LUL, to 3 cm masses RML, 2.3 cm posterior left lung base, extensive thoracic lymphadenopathy 2.9 cm prevascular, 2.2 cm subcarinal, 2 cm left hilar, 1.5 cm right infrahilar, 1.5 cm right IMA , additional bone metastases right acetabulum  Current treatment: Xeloda  2 weeks on one week off started 12/14/16 Xeloda toxicities:   Return to clinic in 2 weeks for cycle 2

## 2016-12-25 NOTE — Telephone Encounter (Signed)
Scanned copy of completed FMLA forms into [employer]

## 2016-12-27 ENCOUNTER — Encounter (HOSPITAL_COMMUNITY): Payer: Self-pay

## 2017-01-01 ENCOUNTER — Other Ambulatory Visit: Payer: Self-pay | Admitting: Emergency Medicine

## 2017-01-07 ENCOUNTER — Other Ambulatory Visit: Payer: Self-pay

## 2017-01-07 ENCOUNTER — Other Ambulatory Visit (HOSPITAL_BASED_OUTPATIENT_CLINIC_OR_DEPARTMENT_OTHER): Payer: Federal, State, Local not specified - PPO

## 2017-01-07 ENCOUNTER — Ambulatory Visit (HOSPITAL_BASED_OUTPATIENT_CLINIC_OR_DEPARTMENT_OTHER): Payer: Federal, State, Local not specified - PPO | Admitting: Hematology and Oncology

## 2017-01-07 ENCOUNTER — Encounter: Payer: Self-pay | Admitting: Hematology and Oncology

## 2017-01-07 VITALS — BP 114/60 | HR 70 | Temp 98.9°F | Ht 66.0 in | Wt 212.3 lb

## 2017-01-07 DIAGNOSIS — Z171 Estrogen receptor negative status [ER-]: Secondary | ICD-10-CM

## 2017-01-07 DIAGNOSIS — T451X5A Adverse effect of antineoplastic and immunosuppressive drugs, initial encounter: Secondary | ICD-10-CM

## 2017-01-07 DIAGNOSIS — C7800 Secondary malignant neoplasm of unspecified lung: Secondary | ICD-10-CM

## 2017-01-07 DIAGNOSIS — C7951 Secondary malignant neoplasm of bone: Secondary | ICD-10-CM

## 2017-01-07 DIAGNOSIS — C778 Secondary and unspecified malignant neoplasm of lymph nodes of multiple regions: Secondary | ICD-10-CM

## 2017-01-07 DIAGNOSIS — R5383 Other fatigue: Secondary | ICD-10-CM

## 2017-01-07 DIAGNOSIS — C50412 Malignant neoplasm of upper-outer quadrant of left female breast: Secondary | ICD-10-CM | POA: Diagnosis not present

## 2017-01-07 DIAGNOSIS — G620 Drug-induced polyneuropathy: Secondary | ICD-10-CM

## 2017-01-07 LAB — CBC WITH DIFFERENTIAL/PLATELET
BASO%: 1.2 % (ref 0.0–2.0)
Basophils Absolute: 0.1 10*3/uL (ref 0.0–0.1)
EOS%: 0.2 % (ref 0.0–7.0)
Eosinophils Absolute: 0 10*3/uL (ref 0.0–0.5)
HEMATOCRIT: 32.9 % — AB (ref 34.8–46.6)
HEMOGLOBIN: 10.7 g/dL — AB (ref 11.6–15.9)
LYMPH#: 1.9 10*3/uL (ref 0.9–3.3)
LYMPH%: 24.4 % (ref 14.0–49.7)
MCH: 27.9 pg (ref 25.1–34.0)
MCHC: 32.5 g/dL (ref 31.5–36.0)
MCV: 85.9 fL (ref 79.5–101.0)
MONO#: 0.8 10*3/uL (ref 0.1–0.9)
MONO%: 9.8 % (ref 0.0–14.0)
NEUT%: 64.4 % (ref 38.4–76.8)
NEUTROS ABS: 5.1 10*3/uL (ref 1.5–6.5)
Platelets: 608 10*3/uL — ABNORMAL HIGH (ref 145–400)
RBC: 3.83 10*6/uL (ref 3.70–5.45)
RDW: 15.8 % — AB (ref 11.2–14.5)
WBC: 7.9 10*3/uL (ref 3.9–10.3)

## 2017-01-07 LAB — COMPREHENSIVE METABOLIC PANEL
ALBUMIN: 3.3 g/dL — AB (ref 3.5–5.0)
ALT: 19 U/L (ref 0–55)
AST: 16 U/L (ref 5–34)
Alkaline Phosphatase: 91 U/L (ref 40–150)
Anion Gap: 9 mEq/L (ref 3–11)
BUN: 13.5 mg/dL (ref 7.0–26.0)
CALCIUM: 10.6 mg/dL — AB (ref 8.4–10.4)
CO2: 26 mEq/L (ref 22–29)
CREATININE: 0.9 mg/dL (ref 0.6–1.1)
Chloride: 106 mEq/L (ref 98–109)
EGFR: >90 mL/min/{1.73_m2} (ref >90)
Glucose: 120 mg/dl (ref 70–140)
Potassium: 3.4 mEq/L — ABNORMAL LOW (ref 3.5–5.1)
Sodium: 141 mEq/L (ref 136–145)
Total Bilirubin: 0.59 mg/dL (ref 0.20–1.20)
Total Protein: 7.8 g/dL (ref 6.4–8.3)

## 2017-01-07 NOTE — Assessment & Plan Note (Signed)
Left lumpectomy 02/13/2016: IDC grade 3, 3.3 cm, margins negative, 0/3 lymph nodes negative, triple negative, T2 N0 stage II a pathologic stage  Left breast biopsy 08/17/2015:1:30 position: Invasive ductal carcinoma, grade 3, ER 0%, PR 0%, HER-2 negative ratio 1.48, Ki-67 90%, 3.1 cm tumor, axilla negative, T2 N0 stage II a clinical stage Breast biopsies x 2: 09/28/2015 : [place]  Treatment summary: 1. Completed Cycle 4 dose dense Adriamycin and Cytoxan on 10/31/15; completed cycle 3 Taxol and carboplatin, completed 7 cycles of Taxol and treatment discontinued for poor tolerance. 2. left lumpectomy 02/13/2016  3. Adjuvant radiation 03/26/2016 to09/07/2016 Patient was offered treatment with Xeloda as well as immunotherapy clinical trial. She refused both of those options. ----------------------------------------------------------------------------------------------------------------------------------------------------- Relapse: CT chest 11/13/2016 revealed multiple lung nodules and mediastinal lymph nodes 11/20/2016: Lung biopsy: Metastatic breast cancer triple negative PET/CT scan 12/04/2016: Multiple bilateral lung nodules or masses, 12 mm LUL, to 3 cm masses RML, 2.3 cm posterior left lung base, extensive thoracic lymphadenopathy 2.9 cm prevascular, 2.2 cm subcarinal, 2 cm left hilar, 1.5 cm right infrahilar, 1.5 cm right IMA , additional bone metastases right acetabulum  Current treatment: Xeloda  2 weeks on one week off started 12/14/16 Xeloda toxicities:  Return to clinic in 3 weeks for lab check

## 2017-01-07 NOTE — Progress Notes (Signed)
Patient Care Team: [name], MD as PCP - General (Internal Medicine)  DIAGNOSIS:  Encounter Diagnoses  Name Primary?  . Chemotherapy-induced peripheral neuropathy ([place]) Yes  . Malignant neoplasm of upper-outer quadrant of left breast in female, estrogen receptor negative ([place])     SUMMARY OF ONCOLOGIC HISTORY:   Breast cancer of upper-outer quadrant of left female breast ([place])   08/17/2015 Initial Diagnosis    left breast biopsy 1:30 position: Invasive ductal carcinoma, grade 3, ER 0%, PR 0%, HER-2 negative ratio 1.48, Ki-67 90%, 3.1 cm tumor, axilla negative, T2 N0 stage II a clinical stage      09/19/2015 - 01/03/2016 Neo-Adjuvant Chemotherapy    Neoadjuvant chemotherapy with dose dense Adriamycin and Cytoxan 4 followed by Carbo/Taxol x 3 cycles (Carbo d/c'd after cycle #3 d/t hospitalization for N&V/dehydration). Went on to complete additional 3 cycles of Taxol alone.       09/28/2015 Procedure     Left breast biopsy anterior third left upper outer quadrant: [facility];  upper inner quadrant: [place]      10/06/2015 Procedure    Genetic testing: Negative. Genes analyzed: ATM, BARD1, BRCA1, BRCA2, BRIP1, CDH1, CHEK2, FANCC, MLH1, MSH2, MSH6, NBN, PALB2, PMS2, PTEN, RAD51C, RAD51D, TP53, and XRCC2.  This panel also includes deletion/duplication analysis (without sequencing) for one gene, EPCAM      01/16/2016 Breast MRI    significant response to chemotherapy with decrease in the tumor from 5.1 cm to 2.9 cm, also decrease a non-mass enhancement      02/13/2016 Surgery    Left lumpectomy ([facility]): IDC grade 3, 3.3 cm, margins negative, 0/3 lymph nodes negative, triple negative, T2 N0 stage II a pathologic stage      02/21/2016 Surgery    Bilateral breast mammoplasty ([place]); no malignancy on path      03/26/2016 - 05/10/2016 Radiation Therapy    Adjuvant radiation therapy Lisbeth Renshaw). Left breast: 50.4 Gy in 28 fractions. Left breast boost: 10 Gy in 5 fractions.       11/13/2016 Imaging    CT chest: Extensive adenopathy throughout anterior mediastinum largest 2.6 cm,Ln at aortic arch 2.8 cm, subcarinal LN 2.8 cm, left hilar LN 1.6 cm, right hilar LN 1.8 cm, lung nodules bilaterally largest right lung 2.6 cm      11/20/2016 Relapse/Recurrence    Lung biopsy left lower quadrant: Metastatic carcinoma consistent with breast      12/04/2016 PET scan    Multiple bilateral lung nodules or masses, 12 mm LUL, to 3 cm masses RML, 2.3 cm posterior left lung base, extensive thoracic lymphadenopathy 2.9 cm prevascular, 2.2 cm subcarinal, 2 cm left hilar, 1.5 cm right infrahilar, 1.5 cm right IMA , additional bone metastases right acetabulum      12/20/2016 -  Chemotherapy    Xeloda 1500 mg by mouth twice a day 2 weeks on one week off       CHIEF COMPLIANT: Follow-up on Xeloda  INTERVAL HISTORY: Cindy Bradshaw is a 46 year old with above-mentioned history metastatic triple negative breast cancer currently on oral Xeloda. She has tolerated the first cycle of Xeloda very well. She did not have much of nausea vomiting. Did not have any diarrhea after the initial first day when she did have loose stools. She does not have any rash on her hands or feet. She is noticed that the cough and expectoration have decreased substantially. She also complains of a new lump beneath the right breast she had noticed recently.  REVIEW OF SYSTEMS:  Constitutional: Denies fevers, chills or abnormal weight loss, severe fatigue and weakness Eyes: Denies blurriness of vision Ears, nose, mouth, throat, and face: Denies mucositis or sore throat Respiratory: Cough has improved markedly. Cardiovascular: Denies palpitation, chest discomfort Gastrointestinal:  Denies nausea, heartburn or change in bowel habits Skin: Denies abnormal skin rashes Lymphatics: Denies new lymphadenopathy or easy bruising Neurological:Denies numbness, tingling or new weaknesses Behavioral/Psych: Mood is stable, no new  changes  Extremities: No lower extremity edema All other systems were reviewed with the patient and are negative.  I have reviewed the past medical history, past surgical history, social history and family history with the patient and they are unchanged from previous note.  ALLERGIES:  is allergic to dilaudid [hydromorphone hcl]; ivp dye [iodinated diagnostic agents]; percocet [oxycodone-acetaminophen]; tape; and toradol [ketorolac tromethamine].  MEDICATIONS:  Current Outpatient Prescriptions  Medication Sig Dispense Refill  . albuterol (PROVENTIL HFA;VENTOLIN HFA) 108 (90 Base) MCG/ACT inhaler Inhale 1-2 puffs into the lungs every 6 (six) hours as needed for wheezing or shortness of breath. 1 Inhaler 0  . albuterol (PROVENTIL) (2.5 MG/3ML) 0.083% nebulizer solution Take 2.5 mg by nebulization every 6 (six) hours as needed for wheezing or shortness of breath.    . capecitabine (XELODA) 500 MG tablet Take 3 tablets (1,500 mg total) by mouth 2 (two) times daily after a meal. 14 days on 7 days off 84 tablet 3  . fexofenadine (ALLEGRA) 180 MG tablet Take 180 mg by mouth 2 (two) times daily.    Marland Kitchen HYDROcodone-acetaminophen (NORCO/VICODIN) 5-325 MG tablet Take 1 tablet by mouth every 4 (four) hours as needed. (Patient taking differently: Take 1 tablet by mouth every 4 (four) hours as needed for moderate pain. ) 10 tablet 0  . HYDROcodone-homatropine (HYCODAN) 5-1.5 MG/5ML syrup Take 5 mLs by mouth every 6 (six) hours as needed for cough. 120 mL 0  . hydrOXYzine (VISTARIL) 25 MG capsule Take 1 capsule (25 mg total) by mouth at bedtime. 90 capsule 0  . Melatonin 10 MG TABS Take 10 mg by mouth at bedtime.     Marland Kitchen oxyCODONE-acetaminophen (PERCOCET) 5-325 MG tablet Take 2 tablets by mouth every 4 (four) hours as needed. (Patient taking differently: Take 2 tablets by mouth every 4 (four) hours as needed for moderate pain. ) 20 tablet 0   No current facility-administered medications for this visit.     Facility-Administered Medications Ordered in Other Visits  Medication Dose Route Frequency Provider Last Rate Last Dose  . 0.9 %  sodium chloride infusion   Intravenous Once Boelter, Genelle Gather, NP        PHYSICAL EXAMINATION: ECOG PERFORMANCE STATUS: 2 - Symptomatic, <50% confined to bed  Vitals:   01/07/17 1441  BP: 114/60  Pulse: 70  Temp: 98.9 F (37.2 C)   Filed Weights   01/07/17 1441  Weight: 212 lb 4.8 oz (96.3 kg)    GENERAL:alert, no distress and comfortable SKIN: skin color, texture, turgor are normal, no rashes or significant lesions EYES: normal, Conjunctiva are pink and non-injected, sclera clear OROPHARYNX:no exudate, no erythema and lips, buccal mucosa, and tongue normal  NECK: supple, thyroid normal size, non-tender, without nodularity LYMPH:  no palpable lymphadenopathy in the cervical, axillary or inguinal LUNGS: clear to auscultation and percussion with normal breathing effort HEART: Felt irregular ABDOMEN:abdomen soft, non-tender and normal bowel sounds MUSCULOSKELETAL:no cyanosis of digits and no clubbing  NEURO: alert & oriented x 3 with fluent speech, no focal motor/sensory deficits EXTREMITIES: No lower extremity edema BREAST:  Palpable lump underneath the right breast 4 cm in size (exam performed in the presence of a chaperone)  LABORATORY DATA:  I have reviewed the data as listed   Chemistry      Component Value Date/Time   NA 141 01/07/2017 1431   K 3.4 (L) 01/07/2017 1431   CL 105 11/22/2016 1440   CO2 26 01/07/2017 1431   BUN 13.5 01/07/2017 1431   CREATININE 0.9 01/07/2017 1431      Component Value Date/Time   CALCIUM 10.6 (H) 01/07/2017 1431   ALKPHOS 91 01/07/2017 1431   AST 16 01/07/2017 1431   ALT 19 01/07/2017 1431   BILITOT 0.59 01/07/2017 1431       Lab Results  Component Value Date   WBC 7.9 01/07/2017   HGB 10.7 (L) 01/07/2017   HCT 32.9 (L) 01/07/2017   MCV 85.9 01/07/2017   PLT 608 (H) 01/07/2017   NEUTROABS  5.1 01/07/2017    ASSESSMENT & PLAN:  Breast cancer of upper-outer quadrant of left female breast ([place]) Left lumpectomy 02/13/2016: IDC grade 3, 3.3 cm, margins negative, 0/3 lymph nodes negative, triple negative, T2 N0 stage II a pathologic stage  Left breast biopsy 08/17/2015:1:30 position: Invasive ductal carcinoma, grade 3, ER 0%, PR 0%, HER-2 negative ratio 1.48, Ki-67 90%, 3.1 cm tumor, axilla negative, T2 N0 stage II a clinical stage Breast biopsies x 2: 09/28/2015 : [place]  Treatment summary: 1. Completed Cycle 4 dose dense Adriamycin and Cytoxan on 10/31/15; completed cycle 3 Taxol and carboplatin, completed 7 cycles of Taxol and treatment discontinued for poor tolerance. 2. left lumpectomy 02/13/2016  3. Adjuvant radiation 03/26/2016 to09/07/2016 Patient was offered treatment with Xeloda as well as immunotherapy clinical trial. She refused both of those options. ----------------------------------------------------------------------------------------------------------------------------------------------------- Relapse: CT chest 11/13/2016 revealed multiple lung nodules and mediastinal lymph nodes 11/20/2016: Lung biopsy: Metastatic breast cancer triple negative PET/CT scan 12/04/2016: Multiple bilateral lung nodules or masses, 12 mm LUL, to 3 cm masses RML, 2.3 cm posterior left lung base, extensive thoracic lymphadenopathy 2.9 cm prevascular, 2.2 cm subcarinal, 2 cm left hilar, 1.5 cm right infrahilar, 1.5 cm right IMA , additional bone metastases right acetabulum  Current treatment: Xeloda  2 weeks on one week off started 12/14/16 Xeloda toxicities: 1. Fatigue Denies any diarrhea or hand-foot syndrome.  Irregular heart rate: EKG obtained showed sinus tachycardia. No evidence of arrhythmia.  Our plan is to perform 3 cycles of Xeloda and then perform CT scans. Palpable lump in the right breast: We will watch and see what happens with this lump on subsequent imaging  studies.  Return to clinic in 3 weeks for lab check  I spent 45 minutes talking to the patient of which more than half was spent in counseling and coordination of care.  No orders of the defined types were placed in this encounter.  The patient has a good understanding of the overall plan. she agrees with it. she will call with any problems that may develop before the next visit here.   Rulon Eisenmenger, MD 01/07/17

## 2017-01-18 ENCOUNTER — Encounter (HOSPITAL_COMMUNITY): Payer: Self-pay | Admitting: Emergency Medicine

## 2017-01-18 ENCOUNTER — Emergency Department (HOSPITAL_COMMUNITY)
Admission: EM | Admit: 2017-01-18 | Discharge: 2017-01-18 | Disposition: A | Discharge status: Home / Self Care | Payer: Federal, State, Local not specified - PPO | Attending: Emergency Medicine | Admitting: Emergency Medicine

## 2017-01-18 ENCOUNTER — Emergency Department (HOSPITAL_BASED_OUTPATIENT_CLINIC_OR_DEPARTMENT_OTHER)
Admit: 2017-01-18 | Discharge: 2017-01-18 | Disposition: A | Discharge status: Home / Self Care | Payer: Federal, State, Local not specified - PPO

## 2017-01-18 DIAGNOSIS — J45909 Unspecified asthma, uncomplicated: Secondary | ICD-10-CM | POA: Diagnosis not present

## 2017-01-18 DIAGNOSIS — Z79899 Other long term (current) drug therapy: Secondary | ICD-10-CM | POA: Insufficient documentation

## 2017-01-18 DIAGNOSIS — M79609 Pain in unspecified limb: Secondary | ICD-10-CM

## 2017-01-18 DIAGNOSIS — M79605 Pain in left leg: Secondary | ICD-10-CM | POA: Diagnosis not present

## 2017-01-18 DIAGNOSIS — Z853 Personal history of malignant neoplasm of breast: Secondary | ICD-10-CM | POA: Insufficient documentation

## 2017-01-18 NOTE — ED Provider Notes (Signed)
[facility] DEPT Provider Note   CSN: [id number] Arrival date & time: 01/18/17  1625  By signing my name below, I, Sonum Patel, attest that this documentation has been prepared under the direction and in the presence of [place], [place]. Electronically Signed: Ludger Nutting, Scribe. 01/18/17. 5:04 PM.  History   Chief Complaint Chief Complaint  Patient presents with  . Leg Pain    The history is provided by the patient. No language interpreter was used.     HPI Comments: Cindy Bradshaw is a 46 y.o. female who presents to the Emergency Department complaining of 2-3 days of two knots to the posterior left calf. She reports associated soreness/achiness that has gradually worsened. She rates her pain as a 7/10. She also had left knee swelling for which she applied heat and ice with mild relief. She denies known injury to the affected area. She denies history of blood clots. She has a history of breast cancer with metastasis to the lungs. She is undergoing her second round of chemotherapy. She denies associated fever, chills, nausea, vomiting, diarrhea, SOB, CP.She states her cough is due to her baseline and denies any other new or worsening symptoms. Patient states she was sent here by her oncologist to rule out DVT.  Past Medical History:  Diagnosis Date  . Arthritis   . Asthma   . Back disorder    degenerative disk disease  . Breast cancer ([place])   . Cancer ([place])   . GERD (gastroesophageal reflux disease)   . Mammogram abnormal 08/24/15   first  . Migraine   . Pap smear for cervical cancer screening 08/12/15    Patient Active Problem List   Diagnosis Date Noted  . Allergic reaction   . Pruritus 04/23/2016  . Chest pain   . Diaphoresis   . Lightheadedness   . Pain in the chest   . SOB (shortness of breath)   . Abnormal nuclear stress test   . Unstable angina ([place]) 01/21/2016  . Right ankle pain 01/21/2016  . Nausea with vomiting 01/04/2016  . Adnexal cyst:  Right per CT 12/09/2015 12/12/2015  . Asthma 12/09/2015  . Abdominal pain 12/09/2015  . Chemotherapy-induced peripheral neuropathy ([place]) 12/05/2015  . Genetic testing 10/13/2015  . Dehydration 09/25/2015  . Family history of breast cancer in female 09/20/2015  . Normocytic normochromic anemia 09/19/2015  . Breast cancer of upper-outer quadrant of left female breast ([place]) 09/07/2015    Past Surgical History:  Procedure Laterality Date  . BREAST LUMPECTOMY WITH RADIOACTIVE SEED AND SENTINEL LYMPH NODE BIOPSY Left 02/13/2016   Procedure: BREAST LUMPECTOMY WITH RADIOACTIVE SEED AND SENTINEL LYMPH NODE BIOPSY;  Surgeon: Excell Seltzer, MD;  Location: [facility];  Service: General;  Laterality: Left;  . BREAST REDUCTION SURGERY Bilateral 02/21/2016   Procedure: MAMMARY REDUCTION  (BREAST)/Oncoplastic breast reconstruction;  Surgeon: Irene Limbo, MD;  Location: [facility];  Service: Plastics;  Laterality: Bilateral;  . CARDIAC CATHETERIZATION [name] 01/24/2016   Procedure: Left Heart Cath and Coronary Angiography;  Surgeon: Leonie Man, MD;  Location: [facility];  Service: Cardiovascular;  Laterality: [name];  . CESAREAN SECTION    . CHOLECYSTECTOMY    . PERIPHERAL VASCULAR CATHETERIZATION Right 02/13/2016   Procedure: PORTA CATH REMOVAL;  Surgeon: Excell Seltzer, MD;  Location: [facility];  Service: General;  Laterality: Right;  . PORTACATH PLACEMENT [name] 09/12/2015   Procedure: INSERTION PORT-A-CATH;  Surgeon: Excell Seltzer, MD;  Location: [facility];  Service: General;  Laterality: [name];    OB History    Gravida Para Term Preterm AB Living   0 0 0 0 0     SAB TAB Ectopic Multiple Live Births   0 0 0           Home Medications    Prior to Admission medications   Medication Sig Start Date End Date Taking? Authorizing Provider  albuterol (PROVENTIL HFA;VENTOLIN HFA) 108 (90 Base) MCG/ACT inhaler Inhale 1-2 puffs into the lungs every  6 (six) hours as needed for wheezing or shortness of breath. 07/24/16  Yes Waynetta Pean, PA-C  albuterol (PROVENTIL) (2.5 MG/3ML) 0.083% nebulizer solution Take 2.5 mg by nebulization every 6 (six) hours as needed for wheezing or shortness of breath.   Yes [provider]  capecitabine (XELODA) 500 MG tablet Take 3 tablets (1,500 mg total) by mouth 2 (two) times daily after a meal. 14 days on 7 days off 11/27/16  Yes Nicholas Lose, MD  fexofenadine (ALLEGRA) 180 MG tablet Take 180 mg by mouth 2 (two) times daily.   Yes [provider]  HYDROcodone-acetaminophen (NORCO/VICODIN) 5-325 MG tablet Take 1 tablet by mouth every 4 (four) hours as needed. Patient taking differently: Take 1 tablet by mouth every 4 (four) hours as needed for moderate pain.  09/20/16  Yes Isla Pence, MD  HYDROcodone-homatropine [facility]) 5-1.5 MG/5ML syrup Take 5 mLs by mouth every 6 (six) hours as needed for cough. 12/20/16  Yes Nicholas Lose, MD  hydrOXYzine (VISTARIL) 25 MG capsule Take 1 capsule (25 mg total) by mouth at bedtime. 07/13/16  Yes Padgett, Rae Halsted, MD  Melatonin 10 MG TABS Take 10 mg by mouth at bedtime.    Yes [provider]  oxyCODONE-acetaminophen (PERCOCET) 5-325 MG tablet Take 2 tablets by mouth every 4 (four) hours as needed. Patient taking differently: Take 2 tablets by mouth every 4 (four) hours as needed for moderate pain.  11/13/16  Yes Charlesetta Shanks, MD    Family History Family History  Problem Relation Age of Onset  . Lung cancer Mother 13       2 different types of lung cancer; metastasis to brain; smoker  . Other Mother        hx of hysterectomy for unspecified reason  . Bone cancer Maternal Uncle        dx. early 58s  . Breast cancer Maternal Grandmother        dx. early 39s, s/p mastectomy  . Cancer Paternal Grandfather        unspecified type of cancer, dx. late 77s  . Congestive Heart Failure Father        smoker  . Stroke Father   .  Eczema Father   . Cirrhosis Maternal Grandfather   . Heart Problems Maternal Grandfather   . Asthma Daughter   . Allergies Daughter        hives  . Lung cancer Other        (maternal great uncle; MGM's brother); had a coal stove  . Cancer Cousin        unspecified type; d. early age (paternal first cousin once-removed)  . Cancer Cousin        dx. as a kid; in remission today; (paternal 2nd cousin)  . Cancer Cousin        unspecified type; d. early 62s; (maternal 1st cousin)    Social History Social History  Substance Use Topics  . Smoking status: Never Smoker  .  Smokeless tobacco: Never Used  . Alcohol use 0.0 oz/week     Comment: occassional glass of wine     Allergies   Dilaudid [hydromorphone hcl]; Ivp dye [iodinated diagnostic agents]; Percocet [oxycodone-acetaminophen]; Tape; and Toradol [ketorolac tromethamine]   Review of Systems Review of Systems  Constitutional: Negative for chills and fever.  Respiratory: Negative for shortness of breath.   Cardiovascular: Negative for chest pain.  Gastrointestinal: Negative for diarrhea, nausea and vomiting.  Musculoskeletal: Positive for joint swelling and myalgias.  All other systems reviewed and are negative.    Physical Exam Updated Vital Signs BP (!) 144/93 (BP Location: Right Arm)   Pulse 83   Temp 98.5 F (36.9 C) (Oral)   Resp 17   Ht 5\' 7"  (1.702 m)   Wt 210 lb (95.3 kg)   SpO2 94%   BMI 32.89 kg/m   Physical Exam  Constitutional: She appears well-developed and well-nourished.  Well appearing  HENT:  Head: Normocephalic and atraumatic.  Nose: Nose normal.  Eyes: Conjunctivae and EOM are normal.  Neck: Normal range of motion.  Cardiovascular: Normal rate, regular rhythm and normal heart sounds.   2+ intact distal pulses  Pulmonary/Chest: Effort normal and breath sounds normal. No respiratory distress.  Normal work of breathing. No respiratory distress noted.   Abdominal: Soft.  Musculoskeletal:  Normal range of motion.  Left leg has mild tenderness to calf, +homans sign, full ROM of left knee and ankle. No redness noted. No obvious swelling. No wound, deformity, or bruising noted   Neurological: She is alert.  Sensation is intact to BLE. Muscle strength 5/5 and good against resistance to knee flexion, extension, and ankle dorsiflexion and plantar flexion.   Skin: Skin is warm.  Psychiatric: She has a normal mood and affect. Her behavior is normal.  Nursing note and vitals reviewed.    ED Treatments / Results  DIAGNOSTIC STUDIES: Oxygen Saturation is 94% on RA, adequate by my interpretation.    COORDINATION OF CARE: 4:49 PM Discussed treatment plan with pt at bedside and pt agreed to plan.   Labs (all labs ordered are listed, but only abnormal results are displayed) Labs Reviewed - No data to display  EKG  EKG Interpretation None       Radiology No results found.  Procedures Procedures (including critical care time)  Medications Ordered in ED Medications - No data to display   Initial Impression / Assessment and Plan / ED Course  I have reviewed the triage vital signs and the nursing notes.  Pertinent labs & imaging results that were available during my care of the patient were reviewed by me and considered in my medical decision making (see chart for details).    Doppler ultrasound negative for DVT. Patient has no other complaints. Heart and lung sounds are clear. Patient is in no apparent distress, afebrile, hemodynamically stable. Patient is not tachycardic, not tachypneic, with O2 saturation at 94 and above here in ED. Patient has free range of motion of her knee and ankle to left foot. No obvious swelling or redness. Conservative treatment discussed. Patient is to follow-up with her primary care provider and oncologist regarding today's visit. I feel safe for discharge at this time. Reasons to immediately return to ED discussed.  Patient also seen and  evaluated by Dr. Ralene Bathe who agrees with assessment and plan.    Final Clinical Impressions(s) / ED Diagnoses   Final diagnoses:  Left leg pain    New Prescriptions New Prescriptions  No medications on file   I personally performed the services described in this documentation, which was scribed in my presence. The recorded information has been reviewed and is accurate.   Flonnie Overman [place], [place] 01/18/17 Lurena Nida    Quintella Reichert, MD 01/21/17 1531

## 2017-01-18 NOTE — Progress Notes (Addendum)
*  PRELIMINARY RESULTS* Vascular Ultrasound Left lower extremity venous duplex has been completed.  Preliminary findings: No evidence of deep vein thrombosis in the visualized veind of the left lower extremity.  Negative for bakers cyst on the left.  Hypoechoic lesion with low level echoes seen prox to mid medial calf,2.6 x 3.7 x 2.3cm, area of pain- unknown etiology.  Everrett Coombe 01/18/2017, 6:30 PM

## 2017-01-18 NOTE — ED Notes (Signed)
Pt taken to car in wheelchair.  

## 2017-01-18 NOTE — Discharge Instructions (Signed)
Please follow-up with your primary care provider regarding today's visit. Please use Tylenol as needed for pain. Use cold/warm compress to the area. Make sure to stretch muscles there and stay active.   Contact a health care provider if: Your pain is getting worse. Your pain is not relieved with medicines. You lose function in the area of the pain if the pain is in your arms, legs, or neck.

## 2017-01-18 NOTE — ED Notes (Signed)
Bed: [facility] Expected date:  Expected time:  Means of arrival:  Comments: 

## 2017-01-18 NOTE — ED Triage Notes (Signed)
Per pt, states left lower leg pain and states she has 2 palpable knots on left lower extremity-no SOB

## 2017-01-22 ENCOUNTER — Telehealth: Payer: Self-pay

## 2017-01-22 ENCOUNTER — Other Ambulatory Visit: Payer: Self-pay

## 2017-01-22 DIAGNOSIS — Z171 Estrogen receptor negative status [ER-]: Secondary | ICD-10-CM

## 2017-01-22 DIAGNOSIS — C50412 Malignant neoplasm of upper-outer quadrant of left female breast: Secondary | ICD-10-CM

## 2017-01-22 DIAGNOSIS — R0789 Other chest pain: Secondary | ICD-10-CM

## 2017-01-22 NOTE — Telephone Encounter (Signed)
Pt called in, states was recently in the ED last week for a knot in her leg and cough. Pt states that they had worked her up for DVT and it was negative.Chest was fine as well, but pt states that she is still having a lot of pain in her R chest and the knot in her leg is still bothering her. She would like to make a follow up appt with [name] this week or next week. Notified [name] and pt scheduled for an appt this Thursday 5/24. Pt verbalized and confirmed time/date. Obtained order for Chest xray to evaluate r chest pain.

## 2017-01-24 ENCOUNTER — Other Ambulatory Visit: Payer: Self-pay

## 2017-01-24 ENCOUNTER — Telehealth: Payer: Self-pay

## 2017-01-24 ENCOUNTER — Encounter: Payer: Self-pay | Admitting: Hematology and Oncology

## 2017-01-24 ENCOUNTER — Other Ambulatory Visit (HOSPITAL_BASED_OUTPATIENT_CLINIC_OR_DEPARTMENT_OTHER): Payer: Federal, State, Local not specified - PPO

## 2017-01-24 ENCOUNTER — Ambulatory Visit (HOSPITAL_BASED_OUTPATIENT_CLINIC_OR_DEPARTMENT_OTHER): Payer: Federal, State, Local not specified - PPO | Admitting: Hematology and Oncology

## 2017-01-24 ENCOUNTER — Ambulatory Visit (HOSPITAL_COMMUNITY)
Admission: RE | Admit: 2017-01-24 | Discharge: 2017-01-24 | Disposition: A | Discharge status: Home / Self Care | Payer: Federal, State, Local not specified - PPO | Source: Ambulatory Visit | Attending: Hematology and Oncology | Admitting: Hematology and Oncology

## 2017-01-24 DIAGNOSIS — C778 Secondary and unspecified malignant neoplasm of lymph nodes of multiple regions: Secondary | ICD-10-CM | POA: Diagnosis not present

## 2017-01-24 DIAGNOSIS — C7800 Secondary malignant neoplasm of unspecified lung: Secondary | ICD-10-CM | POA: Diagnosis not present

## 2017-01-24 DIAGNOSIS — Z171 Estrogen receptor negative status [ER-]: Secondary | ICD-10-CM | POA: Insufficient documentation

## 2017-01-24 DIAGNOSIS — C50412 Malignant neoplasm of upper-outer quadrant of left female breast: Secondary | ICD-10-CM

## 2017-01-24 DIAGNOSIS — C7951 Secondary malignant neoplasm of bone: Secondary | ICD-10-CM | POA: Diagnosis not present

## 2017-01-24 DIAGNOSIS — R0602 Shortness of breath: Secondary | ICD-10-CM

## 2017-01-24 DIAGNOSIS — R05 Cough: Secondary | ICD-10-CM | POA: Diagnosis present

## 2017-01-24 DIAGNOSIS — R0789 Other chest pain: Secondary | ICD-10-CM | POA: Diagnosis present

## 2017-01-24 DIAGNOSIS — R109 Unspecified abdominal pain: Secondary | ICD-10-CM | POA: Diagnosis not present

## 2017-01-24 DIAGNOSIS — C7801 Secondary malignant neoplasm of right lung: Secondary | ICD-10-CM | POA: Diagnosis not present

## 2017-01-24 DIAGNOSIS — R079 Chest pain, unspecified: Secondary | ICD-10-CM | POA: Diagnosis not present

## 2017-01-24 DIAGNOSIS — C7802 Secondary malignant neoplasm of left lung: Secondary | ICD-10-CM | POA: Diagnosis not present

## 2017-01-24 DIAGNOSIS — R000 Tachycardia, unspecified: Secondary | ICD-10-CM

## 2017-01-24 LAB — COMPREHENSIVE METABOLIC PANEL
ALK PHOS: 122 U/L (ref 40–150)
ALT: 46 U/L (ref 0–55)
AST: 26 U/L (ref 5–34)
Albumin: 2.7 g/dL — ABNORMAL LOW (ref 3.5–5.0)
Anion Gap: 8 mEq/L (ref 3–11)
BILIRUBIN TOTAL: 0.51 mg/dL (ref 0.20–1.20)
BUN: 20.9 mg/dL (ref 7.0–26.0)
CHLORIDE: 105 meq/L (ref 98–109)
CO2: 23 meq/L (ref 22–29)
Calcium: 10.5 mg/dL — ABNORMAL HIGH (ref 8.4–10.4)
Creatinine: 0.7 mg/dL (ref 0.6–1.1)
EGFR: >90 mL/min/{1.73_m2} (ref >90)
GLUCOSE: 100 mg/dL (ref 70–140)
Potassium: 3.4 mEq/L — ABNORMAL LOW (ref 3.5–5.1)
SODIUM: 136 meq/L (ref 136–145)
Total Protein: 7.4 g/dL (ref 6.4–8.3)

## 2017-01-24 LAB — CBC WITH DIFFERENTIAL/PLATELET
BASO%: 0.1 % (ref 0.0–2.0)
Basophils Absolute: 0 10*3/uL (ref 0.0–0.1)
EOS%: 0.4 % (ref 0.0–7.0)
Eosinophils Absolute: 0 10*3/uL (ref 0.0–0.5)
HCT: 32.9 % — ABNORMAL LOW (ref 34.8–46.6)
HGB: 10.5 g/dL — ABNORMAL LOW (ref 11.6–15.9)
LYMPH%: 18.5 % (ref 14.0–49.7)
MCH: 27.9 pg (ref 25.1–34.0)
MCHC: 31.9 g/dL (ref 31.5–36.0)
MCV: 87.3 fL (ref 79.5–101.0)
MONO#: 1 10*3/uL — ABNORMAL HIGH (ref 0.1–0.9)
MONO%: 10.1 % (ref 0.0–14.0)
NEUT%: 70.9 % (ref 38.4–76.8)
NEUTROS ABS: 6.8 10*3/uL — AB (ref 1.5–6.5)
Platelets: 509 10*3/uL — ABNORMAL HIGH (ref 145–400)
RBC: 3.77 10*6/uL (ref 3.70–5.45)
RDW: 16.3 % — ABNORMAL HIGH (ref 11.2–14.5)
WBC: 9.5 10*3/uL (ref 3.9–10.3)
lymph#: 1.8 10*3/uL (ref 0.9–3.3)

## 2017-01-24 MED ORDER — MORPHINE SULFATE (PF) 4 MG/ML IV SOLN
INTRAVENOUS | Status: AC
Start: 2017-01-24 — End: 2017-01-24
  Filled 2017-01-24: qty 1

## 2017-01-24 MED ORDER — HYDROCODONE-HOMATROPINE 5-1.5 MG/5ML PO SYRP: 5.0000 mL | ORAL_SOLUTION | Freq: Four times a day (QID) | ORAL | 0 refills | Status: DC | PRN

## 2017-01-24 MED ORDER — MORPHINE SULFATE 4 MG/ML IJ SOLN
4.0000 mg | Freq: Once | INTRAMUSCULAR | Status: AC
  Administered 2017-01-24: 4 mg via INTRAVENOUS
  Filled 2017-01-24: qty 1

## 2017-01-24 MED ORDER — PREDNISONE 50 MG PO TABS: ORAL_TABLET | ORAL | 0 refills | Status: DC

## 2017-01-24 MED ORDER — PREDNISONE 50 MG PO TABS
ORAL_TABLET | ORAL | 0 refills | Status: DC
Start: 2017-01-24 — End: 2017-02-14

## 2017-01-24 NOTE — Progress Notes (Signed)
Called pt to give her confirmation of ct scans and pre medications for her test due to dye allergy. Pt also will need to come in to pick up her cough medicine tomorrow and oral contrast. Pt verbalized understanding and will be here tomorrow. Sent prescriptions for prednisone and gave pt directions on when to take them prior to CT scans. Pt has palliative care referral that was called in today. Pt aware of plan.

## 2017-01-24 NOTE — Progress Notes (Signed)
Patient Care Team: [name], MD as PCP - General (Internal Medicine)  DIAGNOSIS:  Encounter Diagnosis  Name Primary?  . Malignant neoplasm of upper-outer quadrant of left breast in female, estrogen receptor negative ([place])     SUMMARY OF ONCOLOGIC HISTORY:   Breast cancer of upper-outer quadrant of left female breast ([place])   08/17/2015 Initial Diagnosis    left breast biopsy 1:30 position: Invasive ductal carcinoma, grade 3, ER 0%, PR 0%, HER-2 negative ratio 1.48, Ki-67 90%, 3.1 cm tumor, axilla negative, T2 N0 stage II a clinical stage      09/19/2015 - 01/03/2016 Neo-Adjuvant Chemotherapy    Neoadjuvant chemotherapy with dose dense Adriamycin and Cytoxan 4 followed by Carbo/Taxol x 3 cycles (Carbo d/c'd after cycle #3 d/t hospitalization for N&V/dehydration). Went on to complete additional 3 cycles of Taxol alone.       09/28/2015 Procedure     Left breast biopsy anterior third left upper outer quadrant: [facility];  upper inner quadrant: [place]      10/06/2015 Procedure    Genetic testing: Negative. Genes analyzed: ATM, BARD1, BRCA1, BRCA2, BRIP1, CDH1, CHEK2, FANCC, MLH1, MSH2, MSH6, NBN, PALB2, PMS2, PTEN, RAD51C, RAD51D, TP53, and XRCC2.  This panel also includes deletion/duplication analysis (without sequencing) for one gene, EPCAM      01/16/2016 Breast MRI    significant response to chemotherapy with decrease in the tumor from 5.1 cm to 2.9 cm, also decrease a non-mass enhancement      02/13/2016 Surgery    Left lumpectomy ([facility]): IDC grade 3, 3.3 cm, margins negative, 0/3 lymph nodes negative, triple negative, T2 N0 stage II a pathologic stage      02/21/2016 Surgery    Bilateral breast mammoplasty ([place]); no malignancy on path      03/26/2016 - 05/10/2016 Radiation Therapy    Adjuvant radiation therapy Lisbeth Renshaw). Left breast: 50.4 Gy in 28 fractions. Left breast boost: 10 Gy in 5 fractions.       11/13/2016 Imaging    CT chest: Extensive adenopathy  throughout anterior mediastinum largest 2.6 cm,Ln at aortic arch 2.8 cm, subcarinal LN 2.8 cm, left hilar LN 1.6 cm, right hilar LN 1.8 cm, lung nodules bilaterally largest right lung 2.6 cm      11/20/2016 Relapse/Recurrence    Lung biopsy left lower quadrant: Metastatic carcinoma consistent with breast      12/04/2016 PET scan    Multiple bilateral lung nodules or masses, 12 mm LUL, to 3 cm masses RML, 2.3 cm posterior left lung base, extensive thoracic lymphadenopathy 2.9 cm prevascular, 2.2 cm subcarinal, 2 cm left hilar, 1.5 cm right infrahilar, 1.5 cm right IMA , additional bone metastases right acetabulum      12/20/2016 -  Chemotherapy    Xeloda 1500 mg by mouth twice a day 2 weeks on one week off        CHIEF COMPLIANT: Severe persistent cough which leads to vomiting  INTERVAL HISTORY: Cindy Bradshaw is a 46 year old with metastatic breast cancer with lung metastases who is currently on Xeloda therapy. She came in today complaining of worsening shortness of breath as well as a cough that is intractable which is leading to nausea and vomiting. In front of me she threw up 3 times. This cough is quite debilitating. She is having lots of chest and abdominal pain because of persistent cough. She had a chest x-ray today which showed worsening lung metastases. There was no evidence of infection/pneumonia.  REVIEW OF SYSTEMS:   Constitutional:  Denies fevers, chills or abnormal weight loss Eyes: Denies blurriness of vision Ears, nose, mouth, throat, and face: Denies mucositis or sore throat Respiratory: Severe intractable cough which leads to emesis Cardiovascular: Denies palpitation, chest discomfort Gastrointestinal:  Nausea vomiting Skin: Denies abnormal skin rashes Lymphatics: Denies new lymphadenopathy or easy bruising Neurological:Denies numbness, tingling or new weaknesses Behavioral/Psych: Mood is stable, no new changes  Extremities: Nodule in the and the right knee and right  calf. This is making it difficult to stretch the leg.  All other systems were reviewed with the patient and are negative.  I have reviewed the past medical history, past surgical history, social history and family history with the patient and they are unchanged from previous note.  ALLERGIES:  is allergic to dilaudid [hydromorphone hcl]; ivp dye [iodinated diagnostic agents]; percocet [oxycodone-acetaminophen]; tape; and toradol [ketorolac tromethamine].  MEDICATIONS:  Current Outpatient Prescriptions  Medication Sig Dispense Refill  . albuterol (PROVENTIL HFA;VENTOLIN HFA) 108 (90 Base) MCG/ACT inhaler Inhale 1-2 puffs into the lungs every 6 (six) hours as needed for wheezing or shortness of breath. 1 Inhaler 0  . albuterol (PROVENTIL) (2.5 MG/3ML) 0.083% nebulizer solution Take 2.5 mg by nebulization every 6 (six) hours as needed for wheezing or shortness of breath.    . capecitabine (XELODA) 500 MG tablet Take 3 tablets (1,500 mg total) by mouth 2 (two) times daily after a meal. 14 days on 7 days off 84 tablet 3  . fexofenadine (ALLEGRA) 180 MG tablet Take 180 mg by mouth 2 (two) times daily.    Marland Kitchen HYDROcodone-acetaminophen (NORCO/VICODIN) 5-325 MG tablet Take 1 tablet by mouth every 4 (four) hours as needed. (Patient taking differently: Take 1 tablet by mouth every 4 (four) hours as needed for moderate pain. ) 10 tablet 0  . HYDROcodone-homatropine (HYCODAN) 5-1.5 MG/5ML syrup Take 5 mLs by mouth every 6 (six) hours as needed for cough. 120 mL 0  . hydrOXYzine (VISTARIL) 25 MG capsule Take 1 capsule (25 mg total) by mouth at bedtime. 90 capsule 0  . Melatonin 10 MG TABS Take 10 mg by mouth at bedtime.     Marland Kitchen oxyCODONE-acetaminophen (PERCOCET) 5-325 MG tablet Take 2 tablets by mouth every 4 (four) hours as needed. (Patient taking differently: Take 2 tablets by mouth every 4 (four) hours as needed for moderate pain. ) 20 tablet 0  . predniSONE (DELTASONE) 50 MG tablet Take 1 36m tablet 13 hrs  prior to CT, 7hrs prior to CT, and 1 hr prior to CT. 3 tablet 0   No current facility-administered medications for this visit.    Facility-Administered Medications Ordered in Other Visits  Medication Dose Route Frequency Provider Last Rate Last Dose  . 0.9 %  sodium chloride infusion   Intravenous Once Boelter, Heather F, NP        PHYSICAL EXAMINATION: ECOG PERFORMANCE STATUS: 3 - Symptomatic, >50% confined to bed  There were no vitals filed for this visit. There were no vitals filed for this visit.  GENERAL:alert, no distress and comfortable SKIN: skin color, texture, turgor are normal, no rashes or significant lesions EYES: normal, Conjunctiva are pink and non-injected, sclera clear OROPHARYNX:no exudate, no erythema and lips, buccal mucosa, and tongue normal  NECK: supple, thyroid normal size, non-tender, without nodularity LYMPH:  no palpable lymphadenopathy in the cervical, axillary or inguinal LUNGS: clear to auscultation and percussion with normal breathing effort HEART: regular rate & rhythm and no murmurs and no lower extremity edema ABDOMEN:abdomen soft, non-tender and normal bowel  sounds MUSCULOSKELETAL:no cyanosis of digits and no clubbing  NEURO: alert & oriented x 3 with fluent speech, no focal motor/sensory deficits EXTREMITIES: No lower extremity edema, palpable nodule in the right calf   LABORATORY DATA:  I have reviewed the data as listed   Chemistry      Component Value Date/Time   NA 136 01/24/2017 1344   K 3.4 (L) 01/24/2017 1344   CL 105 11/22/2016 1440   CO2 23 01/24/2017 1344   BUN 20.9 01/24/2017 1344   CREATININE 0.7 01/24/2017 1344      Component Value Date/Time   CALCIUM 10.5 (H) 01/24/2017 1344   ALKPHOS 122 01/24/2017 1344   AST 26 01/24/2017 1344   ALT 46 01/24/2017 1344   BILITOT 0.51 01/24/2017 1344       Lab Results  Component Value Date   WBC 9.5 01/24/2017   HGB 10.5 (L) 01/24/2017   HCT 32.9 (L) 01/24/2017   MCV 87.3  01/24/2017   PLT 509 (H) 01/24/2017   NEUTROABS 6.8 (H) 01/24/2017    ASSESSMENT & PLAN:  Breast cancer of upper-outer quadrant of left female breast ([place]) Left lumpectomy 02/13/2016: IDC grade 3, 3.3 cm, margins negative, 0/3 lymph nodes negative, triple negative, T2 N0 stage II a pathologic stage  Left breast biopsy 08/17/2015:1:30 position: Invasive ductal carcinoma, grade 3, ER 0%, PR 0%, HER-2 negative ratio 1.48, Ki-67 90%, 3.1 cm tumor, axilla negative, T2 N0 stage II a clinical stage Breast biopsies x 2: 09/28/2015 : [place]  Treatment summary: 1. Completed Cycle 4 dose dense Adriamycin and Cytoxan on 10/31/15; completed cycle 3 Taxol and carboplatin, completed 7 cycles of Taxol and treatment discontinued for poor tolerance. 2. left lumpectomy 02/13/2016  3. Adjuvant radiation 03/26/2016 to09/07/2016 Patient was offered treatment with Xeloda as well as immunotherapy clinical trial. She refused both of those options. ----------------------------------------------------------------------------------------------------------------------------------------------------- Relapse: CT chest 11/13/2016 revealed multiple lung nodules and mediastinal lymph nodes 11/20/2016: Lung biopsy: Metastatic breast cancer triple negative PET/CT scan 12/04/2016: Multiple bilateral lung nodules or masses, 12 mm LUL, to 3 cm masses RML, 2.3 cm posterior left lung base, extensive thoracic lymphadenopathy 2.9 cm prevascular, 2.2 cm subcarinal, 2 cm left hilar, 1.5 cm right infrahilar, 1.5 cm right IMA , additional bone metastases right acetabulum  Current treatment: Xeloda 2 weeks on one week off started 12/14/16 Xeloda toxicities: 1. Fatigue Denies any diarrhea or hand-foot syndrome.  Irregular heart rate: EKG obtained showed sinus tachycardia. No evidence of arrhythmia.  Our plan is to perform 3 cycles of Xeloda and then perform CT scans. Palpable lump in the right breast: We will watch and see what  happens with this lump on subsequent imaging studies.  Shortness of breath accompanied by cough: Chest x-ray showed progression of disease. I would like to obtain CT chest abdomen pelvis in the next week to assess her disease condition. I anticipate that we will be needing IV chemotherapy because the Lortab does not appear to be working. I will request Dr. Excell Seltzer report a port in her. At the same time I will start [facility] 2 aspirate this lesion on her calf as well.  Severe intractable cough: I sent a prescription for Hycodan cough syrup. We also gave her an injection of morphine to help relieve her symptoms in the clinic.  Return to clinic in one week to discuss the results of the CT scan.   I spent 25 minutes talking to the patient of which more than half was spent in counseling and coordination  of care.  Orders Placed This Encounter  Procedures  . CT Chest W Contrast    Standing Status:   Future    Standing Expiration Date:   01/24/2018    Order Specific Question:   If indicated for the ordered procedure, I authorize the administration of contrast media per Radiology protocol    Answer:   Yes    Order Specific Question:   Reason for Exam (SYMPTOM  OR DIAGNOSIS REQUIRED)    Answer:   Metastatic breast cancer restaging    Order Specific Question:   Is patient pregnant?    Answer:   No    Order Specific Question:   Preferred imaging location?    Answer:   [facility]    Order Specific Question:   Radiology Contrast Protocol - do NOT remove file path    Answer:   \\[email or web address]  . CT Abdomen Pelvis W Contrast    Standing Status:   Future    Standing Expiration Date:   01/24/2018    Order Specific Question:   If indicated for the ordered procedure, I authorize the administration of contrast media per Radiology protocol    Answer:   Yes    Order Specific Question:   Reason for Exam (SYMPTOM  OR DIAGNOSIS REQUIRED)    Answer:   Metastatic breast  cancer restaging with worsening cough and shortness of breath and weight loss    Order Specific Question:   Is patient pregnant?    Answer:   No    Order Specific Question:   Preferred imaging location?    Answer:   [facility]    Order Specific Question:   Radiology Contrast Protocol - do NOT remove file path    Answer:   \\[email or web address]   The patient has a good understanding of the overall plan. she agrees with it. she will call with any problems that may develop before the next visit here.   Rulon Eisenmenger, MD 01/24/17

## 2017-01-24 NOTE — Assessment & Plan Note (Signed)
Left lumpectomy 02/13/2016: IDC grade 3, 3.3 cm, margins negative, 0/3 lymph nodes negative, triple negative, T2 N0 stage II a pathologic stage  Left breast biopsy 08/17/2015:1:30 position: Invasive ductal carcinoma, grade 3, ER 0%, PR 0%, HER-2 negative ratio 1.48, Ki-67 90%, 3.1 cm tumor, axilla negative, T2 N0 stage II a clinical stage Breast biopsies x 2: 09/28/2015 : [place]  Treatment summary: 1. Completed Cycle 4 dose dense Adriamycin and Cytoxan on 10/31/15; completed cycle 3 Taxol and carboplatin, completed 7 cycles of Taxol and treatment discontinued for poor tolerance. 2. left lumpectomy 02/13/2016  3. Adjuvant radiation 03/26/2016 to09/07/2016 Patient was offered treatment with Xeloda as well as immunotherapy clinical trial. She refused both of those options. ----------------------------------------------------------------------------------------------------------------------------------------------------- Relapse: CT chest 11/13/2016 revealed multiple lung nodules and mediastinal lymph nodes 11/20/2016: Lung biopsy: Metastatic breast cancer triple negative PET/CT scan 12/04/2016: Multiple bilateral lung nodules or masses, 12 mm LUL, to 3 cm masses RML, 2.3 cm posterior left lung base, extensive thoracic lymphadenopathy 2.9 cm prevascular, 2.2 cm subcarinal, 2 cm left hilar, 1.5 cm right infrahilar, 1.5 cm right IMA , additional bone metastases right acetabulum  Current treatment: Xeloda 2 weeks on one week off started 12/14/16 Xeloda toxicities: 1. Fatigue Denies any diarrhea or hand-foot syndrome.  Irregular heart rate: EKG obtained showed sinus tachycardia. No evidence of arrhythmia.  Our plan is to perform 3 cycles of Xeloda and then perform CT scans. Palpable lump in the right breast: We will watch and see what happens with this lump on subsequent imaging studies.  Shortness of breath accompanied by cough: Chest x-ray be obtained today. I suspect this is related to her  breast cancer.  Return to clinic in 3 weeks for lab check

## 2017-01-24 NOTE — Progress Notes (Signed)
Per [name], pt to receive 4mg  IV IM today prior to leaving office due to vigorous coughing and vomiting. Pt having R sided chest pain. Prescriptions sent for cough medicine. As well.   Morphine administered IM R arm.

## 2017-01-24 NOTE — Telephone Encounter (Signed)
error 

## 2017-01-29 ENCOUNTER — Telehealth: Payer: Self-pay | Admitting: *Deleted

## 2017-01-29 ENCOUNTER — Other Ambulatory Visit: Payer: Federal, State, Local not specified - PPO

## 2017-01-29 NOTE — Telephone Encounter (Signed)
"  I need to know what to do for my scans tomorrow."    Based on CT scan scheduled at 1:00 pm provided the following instructions.  At midnight tonight take the first Prednisone 50 mg tablet.  Repeat at 8:00 am and 12:00 pm.  At 12:00 pm also take a benadryl 50 mg tablet.  Nothing to eat or drink after 9:00 am.  Drink contrast at 11:00 am and again at 12:00 pm.  Arrive to [facility] radiology registration at 12:45 pm.  No further questions.

## 2017-01-30 ENCOUNTER — Encounter (HOSPITAL_COMMUNITY): Payer: Self-pay

## 2017-01-30 ENCOUNTER — Ambulatory Visit (HOSPITAL_COMMUNITY)
Admission: RE | Admit: 2017-01-30 | Discharge: 2017-01-30 | Disposition: A | Discharge status: Home / Self Care | Payer: Federal, State, Local not specified - PPO | Source: Ambulatory Visit | Attending: Hematology and Oncology | Admitting: Hematology and Oncology

## 2017-01-30 DIAGNOSIS — R918 Other nonspecific abnormal finding of lung field: Secondary | ICD-10-CM | POA: Diagnosis not present

## 2017-01-30 DIAGNOSIS — R229 Localized swelling, mass and lump, unspecified: Secondary | ICD-10-CM | POA: Diagnosis not present

## 2017-01-30 DIAGNOSIS — C50412 Malignant neoplasm of upper-outer quadrant of left female breast: Secondary | ICD-10-CM | POA: Diagnosis present

## 2017-01-30 DIAGNOSIS — Z9049 Acquired absence of other specified parts of digestive tract: Secondary | ICD-10-CM | POA: Diagnosis not present

## 2017-01-30 DIAGNOSIS — R590 Localized enlarged lymph nodes: Secondary | ICD-10-CM | POA: Insufficient documentation

## 2017-01-30 DIAGNOSIS — Z171 Estrogen receptor negative status [ER-]: Secondary | ICD-10-CM | POA: Insufficient documentation

## 2017-01-30 DIAGNOSIS — M779 Enthesopathy, unspecified: Secondary | ICD-10-CM | POA: Diagnosis not present

## 2017-01-30 DIAGNOSIS — J90 Pleural effusion, not elsewhere classified: Secondary | ICD-10-CM | POA: Insufficient documentation

## 2017-01-30 MED ORDER — IOPAMIDOL (ISOVUE-300) INJECTION 61%
100.0000 mL | Freq: Once | INTRAVENOUS | Status: AC | PRN
  Administered 2017-01-30: 100 mL via INTRAVENOUS

## 2017-01-30 MED ORDER — IOPAMIDOL (ISOVUE-300) INJECTION 61%
INTRAVENOUS | Status: AC
  Filled 2017-01-30: qty 100

## 2017-02-04 ENCOUNTER — Telehealth: Payer: Self-pay | Admitting: *Deleted

## 2017-02-04 ENCOUNTER — Telehealth: Payer: Self-pay | Admitting: Hematology and Oncology

## 2017-02-04 ENCOUNTER — Telehealth: Payer: Self-pay | Admitting: Emergency Medicine

## 2017-02-04 ENCOUNTER — Telehealth: Payer: Self-pay

## 2017-02-04 NOTE — Telephone Encounter (Signed)
Pt requested a call back. lvm attempting to call back. Left 2nd voice message asking her to call us.

## 2017-02-04 NOTE — Telephone Encounter (Signed)
Called patient to assess status.  Phone rang three times, answered call ended immediately with no hello or voice heard.    Spoke to her with second call.  "I just woke up.  I feel a little better.  My son is here.  Temp down to 99.0.  It cost me $125.00 to go to the ED and they never do anything stating it's due to my illness.  I need an appointment to go over my 01-30-2017 CT C/A/P scan results."    Again encouraged ED for further evaluation.  Will notify provider of request for appointment for scan results.

## 2017-02-04 NOTE — Telephone Encounter (Signed)
Voicemail received barely audible, murmuring requesting call from nurse.  Frequent coughing able to hear word fever.  Returned patient call.  "Today I have a fever = 100.0.  I feel clammy all over and feel cold.  Used the nebulizer once today.  In bed but when I sit up I feel dizzy.  Coughing up a creamy phlegm.  No diarrhea or vomiting.  Started feeling poorly this morning.  No sore throat but chest congestion.  Have not been around anyone sick.  Yesterday I was able to eat a little oatmeal and two to three 8 oz waters.  Today I drank half a protein drink.  Hard to eat or drink anything because my taste buds are off.  I also need to know when my next appointment is with [name]."  Advised go to the ED for further evaluation.  She confirmed feeling as bad as she sounds.  Difficult call, this nurse had to instruct breathing exercises and try one word responses sue to s.o.b and coughing.  Emphasized taking care of breathing and calling 911 as she says she is home alone, will need to call her son to get to the ED.  Provided next appointment information encouraging the priority at this time is current fever with trouble breathing.

## 2017-02-04 NOTE — Telephone Encounter (Signed)
No new note.  See today's previous note.

## 2017-02-04 NOTE — Telephone Encounter (Signed)
sw pt to confirm 6/5 appt with LC per 6/4 sch msg

## 2017-02-04 NOTE — Telephone Encounter (Signed)
Received voicemail from patient; returned her call to follow up on how she is feeling according to her call to triage this afternoon. No answer; left voicemail with patient to call this office with update.   Per Dr Lindi Adie; he would like patient to see Leory Plowman NP this week.

## 2017-02-05 ENCOUNTER — Encounter: Payer: Self-pay | Admitting: Adult Health

## 2017-02-05 ENCOUNTER — Ambulatory Visit (HOSPITAL_BASED_OUTPATIENT_CLINIC_OR_DEPARTMENT_OTHER): Payer: Federal, State, Local not specified - PPO | Admitting: Adult Health

## 2017-02-05 VITALS — BP 107/70 | HR 74 | Temp 98.5°F | Resp 21 | Ht 67.0 in | Wt 206.6 lb

## 2017-02-05 DIAGNOSIS — C7800 Secondary malignant neoplasm of unspecified lung: Secondary | ICD-10-CM

## 2017-02-05 DIAGNOSIS — R0602 Shortness of breath: Secondary | ICD-10-CM | POA: Diagnosis not present

## 2017-02-05 DIAGNOSIS — C778 Secondary and unspecified malignant neoplasm of lymph nodes of multiple regions: Secondary | ICD-10-CM | POA: Diagnosis not present

## 2017-02-05 DIAGNOSIS — Z171 Estrogen receptor negative status [ER-]: Secondary | ICD-10-CM

## 2017-02-05 DIAGNOSIS — C7951 Secondary malignant neoplasm of bone: Secondary | ICD-10-CM | POA: Diagnosis not present

## 2017-02-05 DIAGNOSIS — R5383 Other fatigue: Secondary | ICD-10-CM

## 2017-02-05 DIAGNOSIS — B379 Candidiasis, unspecified: Secondary | ICD-10-CM | POA: Diagnosis not present

## 2017-02-05 DIAGNOSIS — C50412 Malignant neoplasm of upper-outer quadrant of left female breast: Secondary | ICD-10-CM

## 2017-02-05 DIAGNOSIS — B370 Candidal stomatitis: Secondary | ICD-10-CM

## 2017-02-05 MED ORDER — PROCHLORPERAZINE MALEATE 10 MG PO TABS: 10.0000 mg | ORAL_TABLET | Freq: Four times a day (QID) | ORAL | 1 refills | Status: DC | PRN

## 2017-02-05 MED ORDER — LIDOCAINE-PRILOCAINE 2.5-2.5 % EX CREA: TOPICAL_CREAM | CUTANEOUS | 3 refills | Status: DC

## 2017-02-05 MED ORDER — ONDANSETRON HCL 8 MG PO TABS: 8.0000 mg | ORAL_TABLET | Freq: Two times a day (BID) | ORAL | 1 refills | Status: DC | PRN

## 2017-02-05 MED ORDER — FLUCONAZOLE 200 MG PO TABS: 200.0000 mg | ORAL_TABLET | Freq: Every day | ORAL | 3 refills | Status: DC

## 2017-02-05 MED ORDER — DEXAMETHASONE 4 MG PO TABS: 4.0000 mg | ORAL_TABLET | Freq: Every day | ORAL | 0 refills | Status: DC

## 2017-02-05 NOTE — Progress Notes (Signed)
Patient on plan of care prior to pathways. 

## 2017-02-05 NOTE — Assessment & Plan Note (Addendum)
Left lumpectomy 02/13/2016: IDC grade 3, 3.3 cm, margins negative, 0/3 lymph nodes negative, triple negative, T2 N0 stage II a pathologic stage  Left breast biopsy 08/17/2015:1:30 position: Invasive ductal carcinoma, grade 3, ER 0%, PR 0%, HER-2 negative ratio 1.48, Ki-67 90%, 3.1 cm tumor, axilla negative, T2 N0 stage II a clinical stage Breast biopsies x 2: 09/28/2015 : [place]  Treatment summary: 1. Completed Cycle 4 dose dense Adriamycin and Cytoxan on 10/31/15; completed cycle 3 Taxol and carboplatin, completed 7 cycles of Taxol and treatment discontinued for poor tolerance. 2. left lumpectomy 02/13/2016  3. Adjuvant radiation 03/26/2016 to09/07/2016 Patient was offered treatment with Xeloda as well as immunotherapy clinical trial. She refused both of those options. ----------------------------------------------------------------------------------------------------------------------------------------------------- Relapse: CT chest 11/13/2016 revealed multiple lung nodules and mediastinal lymph nodes 11/20/2016: Lung biopsy: Metastatic breast cancer triple negative PET/CT scan 12/04/2016: Multiple bilateral lung nodules or masses, 12 mm LUL, to 3 cm masses RML, 2.3 cm posterior left lung base, extensive thoracic lymphadenopathy 2.9 cm prevascular, 2.2 cm subcarinal, 2 cm left hilar, 1.5 cm right infrahilar, 1.5 cm right IMA , additional bone metastases right acetabulum CT chest 01/30/2017 demonstrates worsened tumor burden with significant abnormal nodal enlargement in chest, new and enlarged pulmonary nodules and masses, new and enlarged right breast and subcutaneous masses and nodules, trace bilateral pleural effusions  Current treatment: Xeloda 2 weeks on one week off started 12/14/16 Xeloda toxicities: 1. Fatigue  Patient disease on Xeloda is progressing.  Her shortness of breath is worse.  She would like other treatment options.  After detailed review of risks /benefits with Dr. Lindi Adie,  she will start Eribulin on 02/11/2017 and receive it on Day 1 and Day 8 on a 21 day cycle. He will contact Dr. Excell Seltzer to get a port placed ASAP.  For her shortness of breath, she will start Daily po dexamethasone of 13m daily and use her inhaler PRN.  For her thrush, Fluconazole 2063mdaily for 7 days.    I gave her specific instructions on what to do if she worsens in any way, when to [place].

## 2017-02-05 NOTE — Progress Notes (Signed)
[facility] [place] Follow up:    Cindy Cha, MD [address] [place] [address]   DIAGNOSIS: Cancer Staging Breast cancer of upper-outer quadrant of left female breast [facility]) Staging form: Breast, AJCC 7th Edition - Pathologic stage from 02/13/2016: Stage IIA (yT2, N0, cM0) - Signed by Holley Bouche, NP on 07/17/2016   SUMMARY OF ONCOLOGIC HISTORY:   Breast cancer of upper-outer quadrant of left female breast ([place])   08/17/2015 Initial Diagnosis    left breast biopsy 1:30 position: Invasive ductal carcinoma, grade 3, ER 0%, PR 0%, HER-2 negative ratio 1.48, Ki-67 90%, 3.1 cm tumor, axilla negative, T2 N0 stage II a clinical stage      09/19/2015 - 01/03/2016 Neo-Adjuvant Chemotherapy    Neoadjuvant chemotherapy with dose dense Adriamycin and Cytoxan 4 followed by Carbo/Taxol x 3 cycles (Carbo d/c'd after cycle #3 d/t hospitalization for N&V/dehydration). Went on to complete additional 3 cycles of Taxol alone.       09/28/2015 Procedure     Left breast biopsy anterior third left upper outer quadrant: [facility];  upper inner quadrant: [place]      10/06/2015 Procedure    Genetic testing: Negative. Genes analyzed: ATM, BARD1, BRCA1, BRCA2, BRIP1, CDH1, CHEK2, FANCC, MLH1, MSH2, MSH6, NBN, PALB2, PMS2, PTEN, RAD51C, RAD51D, TP53, and XRCC2.  This panel also includes deletion/duplication analysis (without sequencing) for one gene, EPCAM      01/16/2016 Breast MRI    significant response to chemotherapy with decrease in the tumor from 5.1 cm to 2.9 cm, also decrease a non-mass enhancement      02/13/2016 Surgery    Left lumpectomy ([facility]): IDC grade 3, 3.3 cm, margins negative, 0/3 lymph nodes negative, triple negative, T2 N0 stage II a pathologic stage      02/21/2016 Surgery    Bilateral breast mammoplasty ([place]); no malignancy on path      03/26/2016 - 05/10/2016 Radiation Therapy    Adjuvant radiation therapy Lisbeth Renshaw). Left breast: 50.4 Gy  in 28 fractions. Left breast boost: 10 Gy in 5 fractions.       11/13/2016 Imaging    CT chest: Extensive adenopathy throughout anterior mediastinum largest 2.6 cm,Ln at aortic arch 2.8 cm, subcarinal LN 2.8 cm, left hilar LN 1.6 cm, right hilar LN 1.8 cm, lung nodules bilaterally largest right lung 2.6 cm      11/20/2016 Relapse/Recurrence    Lung biopsy left lower quadrant: Metastatic carcinoma consistent with breast      12/04/2016 PET scan    Multiple bilateral lung nodules or masses, 12 mm LUL, to 3 cm masses RML, 2.3 cm posterior left lung base, extensive thoracic lymphadenopathy 2.9 cm prevascular, 2.2 cm subcarinal, 2 cm left hilar, 1.5 cm right infrahilar, 1.5 cm right IMA , additional bone metastases right acetabulum      12/20/2016 -  Chemotherapy    Xeloda 1500 mg by mouth twice a day 2 weeks on one week off        CURRENT THERAPY: Xeloda  INTERVAL HISTORY: Cindy Bradshaw 46 y.o. female returns for evaluation of her triple negative metastatic disease.  She is taking Xeloda 1572m BID 2 weeks on one week off.  This is her third time.  She tolerates the Xeloda relatively well.  She is very short of breath.  She is not able to walk around very much.  She is fatigued.  She is able to bathe herself and toilet, but doe shave difficulty with cleaning and  cooking.  She lives with her son.  She lives with her 33 year old son, and her daughter helps too.  Her shortness of breath has worsened over the past couple of weeks.  She has difficulty walking.  She wants some cough medication for the cough.  She sleeps sitting up.     Patient Active Problem List   Diagnosis Date Noted  . Allergic reaction   . Pruritus 04/23/2016  . Chest pain   . Diaphoresis   . Lightheadedness   . Pain in the chest   . SOB (shortness of breath)   . Abnormal nuclear stress test   . Unstable angina ([place]) 01/21/2016  . Right ankle pain 01/21/2016  . Nausea with vomiting 01/04/2016  . Adnexal cyst: Right  per CT 12/09/2015 12/12/2015  . Asthma 12/09/2015  . Abdominal pain 12/09/2015  . Chemotherapy-induced peripheral neuropathy ([place]) 12/05/2015  . Genetic testing 10/13/2015  . Dehydration 09/25/2015  . Family history of breast cancer in female 09/20/2015  . Normocytic normochromic anemia 09/19/2015  . Breast cancer of upper-outer quadrant of left female breast ([place]) 09/07/2015    is allergic to dilaudid [hydromorphone hcl]; ivp dye [iodinated diagnostic agents]; percocet [oxycodone-acetaminophen]; tape; and toradol [ketorolac tromethamine].  MEDICAL HISTORY: Past Medical History:  Diagnosis Date  . Arthritis   . Asthma   . Back disorder    degenerative disk disease  . Breast cancer ([place])   . Cancer ([place])   . GERD (gastroesophageal reflux disease)   . Mammogram abnormal 08/24/15   first  . Migraine   . Pap smear for cervical cancer screening 08/12/15    SURGICAL HISTORY: Past Surgical History:  Procedure Laterality Date  . BREAST LUMPECTOMY WITH RADIOACTIVE SEED AND SENTINEL LYMPH NODE BIOPSY Left 02/13/2016   Procedure: BREAST LUMPECTOMY WITH RADIOACTIVE SEED AND SENTINEL LYMPH NODE BIOPSY;  Surgeon: Excell Seltzer, MD;  Location: [place];  Service: General;  Laterality: Left;  . BREAST REDUCTION SURGERY Bilateral 02/21/2016   Procedure: MAMMARY REDUCTION  (BREAST)/Oncoplastic breast reconstruction;  Surgeon: Irene Limbo, MD;  Location: [place];  Service: Plastics;  Laterality: Bilateral;  . CARDIAC CATHETERIZATION [name] 01/24/2016   Procedure: Left Heart Cath and Coronary Angiography;  Surgeon: Leonie Man, MD;  Location: [place] [facility];  Service: Cardiovascular;  Laterality: [name];  . CESAREAN SECTION    . CHOLECYSTECTOMY    . PERIPHERAL VASCULAR CATHETERIZATION Right 02/13/2016   Procedure: PORTA CATH REMOVAL;  Surgeon: Excell Seltzer, MD;  Location: [place];  Service: General;  Laterality: Right;  . PORTACATH  PLACEMENT [name] 09/12/2015   Procedure: INSERTION PORT-A-CATH;  Surgeon: Excell Seltzer, MD;  Location[facility];  Service: General;  Laterality: [name];    SOCIAL HISTORY: Social History   Social History  . Marital status: Legally Separated    Spouse name: Chrissie Noa  . Number of children: 2  . Years of education: [name]   Occupational History  . Web designer    Social History Main Topics  . Smoking status: Never Smoker  . Smokeless tobacco: Never Used  . Alcohol use 0.0 oz/week     Comment: occassional glass of wine  . Drug use: No  . Sexual activity: Yes   Other Topics Concern  . Not on file   Social History Narrative   ** Merged History Encounter **       First MP age 2.  LMP 08/26/15.     2 children carried to term.  Age at first live birth 50   H/o oral contrasception use       FAMILY HISTORY: Family History  Problem Relation Age of Onset  . Lung cancer Mother 47       2 different types of lung cancer; metastasis to brain; smoker  . Other Mother        hx of hysterectomy for unspecified reason  . Bone cancer Maternal Uncle        dx. early 69s  . Breast cancer Maternal Grandmother        dx. early 52s, s/p mastectomy  . Cancer Paternal Grandfather        unspecified type of cancer, dx. late 3s  . Congestive Heart Failure Father        smoker  . Stroke Father   . Eczema Father   . Cirrhosis Maternal Grandfather   . Heart Problems Maternal Grandfather   . Asthma Daughter   . Allergies Daughter        hives  . Lung cancer Other        (maternal great uncle; MGM's brother); had a coal stove  . Cancer Cousin        unspecified type; d. early age (paternal first cousin once-removed)  . Cancer Cousin        dx. as a kid; in remission today; (paternal 2nd cousin)  . Cancer Cousin        unspecified type; d. early 53s; (maternal 1st cousin)    Review of Systems  Constitutional: Negative for appetite change, chills, diaphoresis, fatigue, fever and  unexpected weight change.  HENT:   Positive for mouth sores. Negative for hearing loss, lump/mass and sore throat.   Eyes: Negative for eye problems and icterus.  Respiratory: Positive for cough, shortness of breath and wheezing. Negative for chest tightness.   Cardiovascular: Negative for chest pain, leg swelling and palpitations.  Gastrointestinal: Negative for abdominal distention, abdominal pain, constipation and nausea.  Endocrine: Negative for hot flashes.  Genitourinary: Negative for difficulty urinating.   Musculoskeletal: Negative for arthralgias.  Neurological: Negative for dizziness, extremity weakness and headaches.  Hematological: Negative for adenopathy. Does not bruise/bleed easily.  Psychiatric/Behavioral: Negative for depression. The patient is not nervous/anxious.       PHYSICAL EXAMINATION  ECOG PERFORMANCE STATUS: 2 - Symptomatic, <50% confined to bed  Vitals:   02/05/17 1509  BP: 107/70  Pulse: 74  Resp: (!) 21  Temp: 98.5 F (36.9 C)    Physical Exam  Constitutional: She is oriented to person, place, and time and well-developed, well-nourished, and in no distress.  HENT:  Head: Normocephalic and atraumatic.  Thrush noted on tongue and posterior pharynx  Eyes: No scleral icterus.  Neck: Neck supple.  Cardiovascular: Normal rate, regular rhythm and normal heart sounds.   Pulmonary/Chest:  + tachypnea, wheezing throughout, shallow breathing  Abdominal: Soft. Bowel sounds are normal.  Musculoskeletal: She exhibits no edema.  Lymphadenopathy:    She has no cervical adenopathy.  Neurological: She is alert and oriented to person, place, and time.  Skin: Skin is warm and dry. No rash noted.  Psychiatric: Mood and affect normal.    LABORATORY DATA:  CBC    Component Value Date/Time   WBC 9.5 01/24/2017 1344   WBC 5.8 11/22/2016 1440   RBC 3.77 01/24/2017 1344   RBC 4.15 11/22/2016 1440   HGB 10.5 (L) 01/24/2017 1344   HCT 32.9 (L) 01/24/2017 1344    PLT 509 (H) 01/24/2017 1344  MCV 87.3 01/24/2017 1344   MCH 27.9 01/24/2017 1344   MCH 28.0 11/22/2016 1440   MCHC 31.9 01/24/2017 1344   MCHC 32.0 11/22/2016 1440   RDW 16.3 (H) 01/24/2017 1344   LYMPHSABS 1.8 01/24/2017 1344   MONOABS 1.0 (H) 01/24/2017 1344   EOSABS 0.0 01/24/2017 1344   BASOSABS 0.0 01/24/2017 1344    CMP     Component Value Date/Time   NA 136 01/24/2017 1344   K 3.4 (L) 01/24/2017 1344   CL 105 11/22/2016 1440   CO2 23 01/24/2017 1344   GLUCOSE 100 01/24/2017 1344   BUN 20.9 01/24/2017 1344   CREATININE 0.7 01/24/2017 1344   CALCIUM 10.5 (H) 01/24/2017 1344   PROT 7.4 01/24/2017 1344   ALBUMIN 2.7 (L) 01/24/2017 1344   AST 26 01/24/2017 1344   ALT 46 01/24/2017 1344   ALKPHOS 122 01/24/2017 1344   BILITOT 0.51 01/24/2017 1344   GFRNONAA >60 11/22/2016 1440   GFRAA >60 11/22/2016 1440        ASSESSMENT and PLAN:   Breast cancer of upper-outer quadrant of left female breast ([place]) Left lumpectomy 02/13/2016: IDC grade 3, 3.3 cm, margins negative, 0/3 lymph nodes negative, triple negative, T2 N0 stage II a pathologic stage  Left breast biopsy 08/17/2015:1:30 position: Invasive ductal carcinoma, grade 3, ER 0%, PR 0%, HER-2 negative ratio 1.48, Ki-67 90%, 3.1 cm tumor, axilla negative, T2 N0 stage II a clinical stage Breast biopsies x 2: 09/28/2015 : [place]  Treatment summary: 1. Completed Cycle 4 dose dense Adriamycin and Cytoxan on 10/31/15; completed cycle 3 Taxol and carboplatin, completed 7 cycles of Taxol and treatment discontinued for poor tolerance. 2. left lumpectomy 02/13/2016  3. Adjuvant radiation 03/26/2016 to09/07/2016 Patient was offered treatment with Xeloda as well as immunotherapy clinical trial. She refused both of those options. ----------------------------------------------------------------------------------------------------------------------------------------------------- Relapse: CT chest 11/13/2016 revealed multiple  lung nodules and mediastinal lymph nodes 11/20/2016: Lung biopsy: Metastatic breast cancer triple negative PET/CT scan 12/04/2016: Multiple bilateral lung nodules or masses, 12 mm LUL, to 3 cm masses RML, 2.3 cm posterior left lung base, extensive thoracic lymphadenopathy 2.9 cm prevascular, 2.2 cm subcarinal, 2 cm left hilar, 1.5 cm right infrahilar, 1.5 cm right IMA , additional bone metastases right acetabulum CT chest 01/30/2017 demonstrates worsened tumor burden with significant abnormal nodal enlargement in chest, new and enlarged pulmonary nodules and masses, new and enlarged right breast and subcutaneous masses and nodules, trace bilateral pleural effusions  Current treatment: Xeloda 2 weeks on one week off started 12/14/16 Xeloda toxicities: 1. Fatigue  Patient disease on Xeloda is progressing.  Her shortness of breath is worse.  She would like other treatment options.  After detailed review of risks /benefits with Dr. Lindi Adie, she will start Eribulin on 02/11/2017 and receive it on Day 1 and Day 8 on a 21 day cycle. He will contact Dr. Excell Seltzer to get a port placed ASAP.  For her shortness of breath, she will start Daily po dexamethasone of 18m daily and use her inhaler PRN.  For her thrush, Fluconazole 2071mdaily for 7 days.    I gave her specific instructions on what to do if she worsens in any way, [place] to [place] [facility] [place].     All questions were answered. The patient knows to call the clinic with any problems, questions or concerns. We can certainly see the patient much sooner if necessary.  This note was electronically signed.  LiScot DockNP 02/05/2017  Attending Note  I personally saw and examined Cindy Bradshaw. The plan of care was discussed with her. I agree with the assessment and plan as documented above. I discussed with the patient that the CT scan suggests worsening of her disease. I am very worried that Xeloda is not working and that if left  alone she will progress and could die from her breast cancer fairly sore. I discussed with her at length about goals of care and CODE STATUS. Patient has a very poor prognosis and I discussed with her about DO NOT RESUSCITATE as well as goals of care. At this point patient does not want to do a DO NOT RESUSCITATE.  Treatment options discussed included [place] versus no further systemic chemotherapy and to consider end-of-life care. Patient is not prepared to accept end-of-life care and wants to try different chemotherapy. Based on risk-benefit analysis I recommended [place]. The goals of treatment are palliation. Patient understands that we cannot cure her cancer and our goal is to improve her symptoms. I will request Dr. Excell Seltzer to urgently place a port and I plan to start chemotherapy next Monday.    Signed Rulon Eisenmenger, MD

## 2017-02-05 NOTE — Progress Notes (Signed)
START ON PATHWAY REGIMEN - Breast     A cycle is every 21 days:     Eribulin mesylate   **Always confirm dose/schedule in your pharmacy ordering system**    Patient Characteristics: Metastatic Chemotherapy, HER2 Negative/Unknown/Equivocal, ER -Verita Schneiders, Third Line, Prior Anthracycline or Anthracycline Contraindicated and Prior Taxane Therapeutic Status: Distant Metastases BRCA Mutation Status: Absent ER Status: Negative (-) HER2 Status: Negative (-) Would you be surprised if this patient died  in the next year? I would NOT be surprised if this patient died in the next year PR Status: Negative (-) Line of therapy: Third Line  Intent of Therapy: Non-Curative / Palliative Intent, Discussed with Patient

## 2017-02-07 ENCOUNTER — Telehealth: Payer: Self-pay

## 2017-02-07 ENCOUNTER — Other Ambulatory Visit: Payer: Self-pay

## 2017-02-07 DIAGNOSIS — C50412 Malignant neoplasm of upper-outer quadrant of left female breast: Secondary | ICD-10-CM

## 2017-02-07 DIAGNOSIS — Z171 Estrogen receptor negative status [ER-]: Secondary | ICD-10-CM

## 2017-02-07 NOTE — Telephone Encounter (Signed)
Received vm from pt regarding needing a schedule for her port placement with Dr.Hoxworth. Called and spoke with pt and she states that she had been trying to call Dr.Hoxworth's office to return their messages about scheduling a port and she was unable to get a hold of anyone. Pt states that she needs her port placed before her chemo on Monday. Attempted to call Dr.Hoxworth's office 3x but unable to get a hold of their office. It would ring a few times and cut off without going to voicemail. Was notified that they've had some technical issues with their phone lines and computer system today. Due the circumstance, obtained order from [place] to have port placed on Monday with [facility], in order to prevent delays in treatment. Called to schedule with IR today and confirmed appt for Monday 02/08/10 at 9am. Will send msg to scheduling to move pt chemo appt later in the afternoon since pt will not be done until 1pm that day. Confirmed time/date with pt and is agreeable with plan for port placement and chemo after. No further concerns at this time. Sent message to Dr.Hoxworth to notify him of situation.

## 2017-02-08 ENCOUNTER — Other Ambulatory Visit: Payer: Self-pay | Admitting: Physician Assistant

## 2017-02-11 ENCOUNTER — Other Ambulatory Visit: Payer: Federal, State, Local not specified - PPO

## 2017-02-11 ENCOUNTER — Ambulatory Visit: Payer: Federal, State, Local not specified - PPO

## 2017-02-11 ENCOUNTER — Other Ambulatory Visit: Payer: Self-pay

## 2017-02-11 ENCOUNTER — Observation Stay (HOSPITAL_COMMUNITY)
Admission: EM | Admit: 2017-02-11 | Discharge: 2017-02-14 | Disposition: A | Discharge status: Home Health | Payer: Federal, State, Local not specified - PPO | Attending: Internal Medicine | Admitting: Internal Medicine

## 2017-02-11 ENCOUNTER — Ambulatory Visit: Payer: Federal, State, Local not specified - PPO | Admitting: Adult Health

## 2017-02-11 ENCOUNTER — Encounter (HOSPITAL_COMMUNITY): Payer: Self-pay

## 2017-02-11 ENCOUNTER — Ambulatory Visit (HOSPITAL_COMMUNITY)
Admission: RE | Admit: 2017-02-11 | Discharge: 2017-02-11 | Disposition: A | Discharge status: Home / Self Care | Payer: Federal, State, Local not specified - PPO | Source: Ambulatory Visit | Attending: Hematology and Oncology | Admitting: Hematology and Oncology

## 2017-02-11 ENCOUNTER — Other Ambulatory Visit: Payer: Self-pay | Admitting: Hematology and Oncology

## 2017-02-11 ENCOUNTER — Encounter (HOSPITAL_COMMUNITY): Payer: Self-pay | Admitting: Emergency Medicine

## 2017-02-11 DIAGNOSIS — Z803 Family history of malignant neoplasm of breast: Secondary | ICD-10-CM | POA: Insufficient documentation

## 2017-02-11 DIAGNOSIS — Z91041 Radiographic dye allergy status: Secondary | ICD-10-CM | POA: Diagnosis not present

## 2017-02-11 DIAGNOSIS — G43909 Migraine, unspecified, not intractable, without status migrainosus: Secondary | ICD-10-CM | POA: Diagnosis not present

## 2017-02-11 DIAGNOSIS — D6481 Anemia due to antineoplastic chemotherapy: Secondary | ICD-10-CM | POA: Insufficient documentation

## 2017-02-11 DIAGNOSIS — J449 Chronic obstructive pulmonary disease, unspecified: Secondary | ICD-10-CM | POA: Diagnosis not present

## 2017-02-11 DIAGNOSIS — R079 Chest pain, unspecified: Secondary | ICD-10-CM | POA: Diagnosis present

## 2017-02-11 DIAGNOSIS — Z9289 Personal history of other medical treatment: Secondary | ICD-10-CM

## 2017-02-11 DIAGNOSIS — Z79899 Other long term (current) drug therapy: Secondary | ICD-10-CM | POA: Insufficient documentation

## 2017-02-11 DIAGNOSIS — Z853 Personal history of malignant neoplasm of breast: Secondary | ICD-10-CM | POA: Insufficient documentation

## 2017-02-11 DIAGNOSIS — C50412 Malignant neoplasm of upper-outer quadrant of left female breast: Secondary | ICD-10-CM | POA: Diagnosis present

## 2017-02-11 DIAGNOSIS — I4891 Unspecified atrial fibrillation: Secondary | ICD-10-CM | POA: Diagnosis present

## 2017-02-11 DIAGNOSIS — C7802 Secondary malignant neoplasm of left lung: Secondary | ICD-10-CM | POA: Diagnosis not present

## 2017-02-11 DIAGNOSIS — I5021 Acute systolic (congestive) heart failure: Secondary | ICD-10-CM | POA: Diagnosis not present

## 2017-02-11 DIAGNOSIS — I480 Paroxysmal atrial fibrillation: Secondary | ICD-10-CM | POA: Diagnosis not present

## 2017-02-11 DIAGNOSIS — Z7952 Long term (current) use of systemic steroids: Secondary | ICD-10-CM | POA: Diagnosis not present

## 2017-02-11 DIAGNOSIS — D630 Anemia in neoplastic disease: Secondary | ICD-10-CM | POA: Diagnosis not present

## 2017-02-11 DIAGNOSIS — Z91048 Other nonmedicinal substance allergy status: Secondary | ICD-10-CM | POA: Diagnosis not present

## 2017-02-11 DIAGNOSIS — C7801 Secondary malignant neoplasm of right lung: Secondary | ICD-10-CM | POA: Insufficient documentation

## 2017-02-11 DIAGNOSIS — I420 Dilated cardiomyopathy: Secondary | ICD-10-CM | POA: Diagnosis not present

## 2017-02-11 DIAGNOSIS — C7951 Secondary malignant neoplasm of bone: Secondary | ICD-10-CM | POA: Diagnosis not present

## 2017-02-11 DIAGNOSIS — Z171 Estrogen receptor negative status [ER-]: Secondary | ICD-10-CM

## 2017-02-11 DIAGNOSIS — Z801 Family history of malignant neoplasm of trachea, bronchus and lung: Secondary | ICD-10-CM | POA: Insufficient documentation

## 2017-02-11 DIAGNOSIS — C50919 Malignant neoplasm of unspecified site of unspecified female breast: Secondary | ICD-10-CM | POA: Diagnosis present

## 2017-02-11 DIAGNOSIS — R0789 Other chest pain: Secondary | ICD-10-CM | POA: Diagnosis not present

## 2017-02-11 DIAGNOSIS — Z885 Allergy status to narcotic agent status: Secondary | ICD-10-CM | POA: Insufficient documentation

## 2017-02-11 HISTORY — PX: IR US GUIDE VASC ACCESS RIGHT: IMG2390

## 2017-02-11 HISTORY — PX: IR FLUORO GUIDE CV LINE RIGHT: IMG2283

## 2017-02-11 LAB — CBC
HCT: 31.7 % — ABNORMAL LOW (ref 36.0–46.0)
HEMATOCRIT: 31.6 % — AB (ref 36.0–46.0)
HEMOGLOBIN: 10.1 g/dL — AB (ref 12.0–15.0)
Hemoglobin: 10.1 g/dL — ABNORMAL LOW (ref 12.0–15.0)
MCH: 27.6 pg (ref 26.0–34.0)
MCH: 27.4 pg (ref 26.0–34.0)
MCHC: 32 g/dL (ref 30.0–36.0)
MCHC: 31.9 g/dL (ref 30.0–36.0)
MCV: 86.3 fL (ref 78.0–100.0)
MCV: 86.1 fL (ref 78.0–100.0)
Platelets: 455 10*3/uL — ABNORMAL HIGH (ref 150–400)
Platelets: 409 K/uL — ABNORMAL HIGH (ref 150–400)
RBC: 3.66 MIL/uL — AB (ref 3.87–5.11)
RBC: 3.68 MIL/uL — ABNORMAL LOW (ref 3.87–5.11)
RDW: 17.6 % — ABNORMAL HIGH (ref 11.5–15.5)
RDW: 17.6 % — ABNORMAL HIGH (ref 11.5–15.5)
WBC: 7.2 10*3/uL (ref 4.0–10.5)
WBC: 6.4 K/uL (ref 4.0–10.5)

## 2017-02-11 LAB — DIFFERENTIAL
BASOS ABS: 0 10*3/uL (ref 0.0–0.1)
BASOS PCT: 0 %
Eosinophils Absolute: 0 10*3/uL (ref 0.0–0.7)
Eosinophils Relative: 1 %
Lymphocytes Relative: 23 %
Lymphs Abs: 1.4 10*3/uL (ref 0.7–4.0)
MONO ABS: 0.3 10*3/uL (ref 0.1–1.0)
Monocytes Relative: 5 %
Neutro Abs: 4.3 10*3/uL (ref 1.7–7.7)
Neutrophils Relative %: 71 %

## 2017-02-11 LAB — TROPONIN I: Troponin I: 0.03 ng/mL (ref <0.03)

## 2017-02-11 LAB — BASIC METABOLIC PANEL
Anion gap: 6 (ref 5–15)
BUN: 25 mg/dL — ABNORMAL HIGH (ref 6–20)
CHLORIDE: 100 mmol/L — AB (ref 101–111)
CO2: 27 mmol/L (ref 22–32)
CREATININE: 0.62 mg/dL (ref 0.44–1.00)
Calcium: 10.3 mg/dL (ref 8.9–10.3)
Glucose, Bld: 99 mg/dL (ref 65–99)
Potassium: 4.3 mmol/L (ref 3.5–5.1)
SODIUM: 133 mmol/L — AB (ref 135–145)

## 2017-02-11 LAB — TSH: TSH: 0.453 u[IU]/mL (ref 0.350–4.500)

## 2017-02-11 LAB — APTT: aPTT: 24 seconds (ref 24–36)

## 2017-02-11 LAB — T4, FREE: Free T4: 0.84 ng/dL (ref 0.61–1.12)

## 2017-02-11 LAB — I-STAT TROPONIN, ED: TROPONIN I, POC: 0.02 ng/mL (ref 0.00–0.08)

## 2017-02-11 LAB — PROTIME-INR
INR: 1.13
Prothrombin Time: 14.5 s (ref 11.4–15.2)

## 2017-02-11 LAB — MRSA PCR SCREENING: MRSA BY PCR: NEGATIVE

## 2017-02-11 MED ORDER — ASPIRIN EC 81 MG PO TBEC
81.0000 mg | DELAYED_RELEASE_TABLET | Freq: Every day | ORAL | Status: DC
  Administered 2017-02-11 – 2017-02-12 (×2): 81 mg via ORAL
  Filled 2017-02-11 (×2): qty 1

## 2017-02-11 MED ORDER — SODIUM CHLORIDE 0.9% FLUSH
10.0000 mL | INTRAVENOUS | Status: DC | PRN
  Administered 2017-02-14: 20 mL
  Filled 2017-02-11: qty 40

## 2017-02-11 MED ORDER — FLUCONAZOLE 100 MG PO TABS
200.0000 mg | ORAL_TABLET | Freq: Every day | ORAL | Status: DC
  Administered 2017-02-11 – 2017-02-12 (×2): 200 mg via ORAL
  Filled 2017-02-11 (×2): qty 2

## 2017-02-11 MED ORDER — LIDOCAINE HCL 1 % IJ SOLN
INTRAMUSCULAR | Status: DC | PRN
  Administered 2017-02-11: 10 mL

## 2017-02-11 MED ORDER — ONDANSETRON HCL 4 MG/2ML IJ SOLN
4.0000 mg | Freq: Four times a day (QID) | INTRAMUSCULAR | Status: DC | PRN
  Administered 2017-02-14 (×2): 4 mg via INTRAVENOUS
  Filled 2017-02-11 (×2): qty 2

## 2017-02-11 MED ORDER — DILTIAZEM HCL-DEXTROSE 100-5 MG/100ML-% IV SOLN (PREMIX)
5.0000 mg/h | INTRAVENOUS | Status: DC
  Administered 2017-02-11: 5 mg/h via INTRAVENOUS
  Administered 2017-02-12: 7.5 mg/h via INTRAVENOUS
  Filled 2017-02-11 (×2): qty 100

## 2017-02-11 MED ORDER — MELATONIN 10 MG PO TABS: 10.0000 mg | ORAL_TABLET | Freq: Every day | ORAL | Status: DC

## 2017-02-11 MED ORDER — CAPECITABINE 500 MG PO TABS
1500.0000 mg | ORAL_TABLET | Freq: Two times a day (BID) | ORAL | Status: DC
  Administered 2017-02-11 – 2017-02-13 (×4): 1500 mg via ORAL

## 2017-02-11 MED ORDER — SODIUM CHLORIDE 0.9 % IV BOLUS (SEPSIS)
500.0000 mL | Freq: Once | INTRAVENOUS | Status: AC
  Administered 2017-02-11: 500 mL via INTRAVENOUS

## 2017-02-11 MED ORDER — CHLORHEXIDINE GLUCONATE CLOTH 2 % EX PADS
6.0000 | MEDICATED_PAD | Freq: Every day | CUTANEOUS | Status: DC
  Administered 2017-02-11 – 2017-02-13 (×3): 6 via TOPICAL

## 2017-02-11 MED ORDER — ACETAMINOPHEN 325 MG PO TABS
650.0000 mg | ORAL_TABLET | ORAL | Status: DC | PRN
  Administered 2017-02-11 – 2017-02-13 (×2): 650 mg via ORAL
  Filled 2017-02-11 (×2): qty 2

## 2017-02-11 MED ORDER — LIDOCAINE HCL 1 % IJ SOLN
INTRAMUSCULAR | Status: AC
  Filled 2017-02-11: qty 20

## 2017-02-11 MED ORDER — CAPECITABINE 500 MG PO TABS
1500.0000 mg | ORAL_TABLET | Freq: Two times a day (BID) | ORAL | Status: DC
  Filled 2017-02-11 (×2): qty 3

## 2017-02-11 MED ORDER — DEXAMETHASONE 4 MG PO TABS
4.0000 mg | ORAL_TABLET | Freq: Every day | ORAL | Status: DC
  Administered 2017-02-12 – 2017-02-14 (×3): 4 mg via ORAL
  Filled 2017-02-11: qty 2
  Filled 2017-02-11: qty 1
  Filled 2017-02-11 (×2): qty 2

## 2017-02-11 MED ORDER — CEFAZOLIN SODIUM-DEXTROSE 2-4 GM/100ML-% IV SOLN: 2.0000 g | Freq: Once | INTRAVENOUS | Status: DC

## 2017-02-11 MED ORDER — ALBUTEROL SULFATE (2.5 MG/3ML) 0.083% IN NEBU: 2.5000 mg | INHALATION_SOLUTION | Freq: Four times a day (QID) | RESPIRATORY_TRACT | Status: DC | PRN

## 2017-02-11 MED ORDER — SODIUM CHLORIDE 0.9 % IV SOLN
INTRAVENOUS | Status: DC
  Administered 2017-02-11: 11:00:00 via INTRAVENOUS

## 2017-02-11 MED ORDER — DILTIAZEM LOAD VIA INFUSION
20.0000 mg | Freq: Once | INTRAVENOUS | Status: AC
  Administered 2017-02-11: 20 mg via INTRAVENOUS
  Filled 2017-02-11: qty 20

## 2017-02-11 NOTE — H&P (Signed)
Referring Physician(s): Nicholas Lose  Supervising Physician: Corrie Mckusick  Patient Status:  [facility]  Chief Complaint:  "I'm getting a port a cath"  Subjective: Patient familiar to IR service from prior left lower lobe lung lesion biopsy in March of this year. She has a history of metastatic left breast cancer and is status post surgery /chemoradiation. She has evidence of  disease progression on recent imaging and presents today for Port-A-Cath placement for additional palliative chemotherapy. She currently denies fever, chest pain, abdominal/back pain, nausea, vomiting or abnormal bleeding. She does have fatigue, occasional headaches, dyspnea with exertion, occasional cough. Past Medical History:  Diagnosis Date  . Arthritis   . Asthma   . Back disorder    degenerative disk disease  . Breast cancer ([place])   . Cancer ([place])   . GERD (gastroesophageal reflux disease)   . Mammogram abnormal 08/24/15   first  . Migraine   . Pap smear for cervical cancer screening 08/12/15   Past Surgical History:  Procedure Laterality Date  . BREAST LUMPECTOMY WITH RADIOACTIVE SEED AND SENTINEL LYMPH NODE BIOPSY Left 02/13/2016   Procedure: BREAST LUMPECTOMY WITH RADIOACTIVE SEED AND SENTINEL LYMPH NODE BIOPSY;  Surgeon: Excell Seltzer, MD;  Location: [facility];  Service: General;  Laterality: Left;  . BREAST REDUCTION SURGERY Bilateral 02/21/2016   Procedure: MAMMARY REDUCTION  (BREAST)/Oncoplastic breast reconstruction;  Surgeon: Irene Limbo, MD;  Location: [facility];  Service: Plastics;  Laterality: Bilateral;  . CARDIAC CATHETERIZATION [name] 01/24/2016   Procedure: Left Heart Cath and Coronary Angiography;  Surgeon: Leonie Man, MD;  Location: [facility];  Service: Cardiovascular;  Laterality: [name];  . CESAREAN SECTION    . CHOLECYSTECTOMY    . PERIPHERAL VASCULAR CATHETERIZATION Right 02/13/2016   Procedure: PORTA CATH REMOVAL;  Surgeon: Excell Seltzer, MD;  Location: [facility];  Service: General;  Laterality: Right;  . PORTACATH PLACEMENT [name] 09/12/2015   Procedure: INSERTION PORT-A-CATH;  Surgeon: Excell Seltzer, MD;  [facility];  Service: General;  Laterality: [name];      Allergies: Dilaudid [hydromorphone hcl]; Ivp dye [iodinated diagnostic agents]; Percocet [oxycodone-acetaminophen]; Tape; and Toradol [ketorolac tromethamine]  Medications: Prior to Admission medications   Medication Sig Start Date End Date Taking? Authorizing Provider  albuterol (PROVENTIL HFA;VENTOLIN HFA) 108 (90 Base) MCG/ACT inhaler Inhale 1-2 puffs into the lungs every 6 (six) hours as needed for wheezing or shortness of breath. 07/24/16   Waynetta Pean, PA-C  albuterol (PROVENTIL) (2.5 MG/3ML) 0.083% nebulizer solution Take 2.5 mg by nebulization every 6 (six) hours as needed for wheezing or shortness of breath.    [provider]  capecitabine (XELODA) 500 MG tablet Take 3 tablets (1,500 mg total) by mouth 2 (two) times daily after a meal. 14 days on 7 days off 11/27/16   Nicholas Lose, MD  dexamethasone (DECADRON) 4 MG tablet Take 1 tablet (4 mg total) by mouth daily. 02/05/17   Gardenia Phlegm, NP  fexofenadine (ALLEGRA) 180 MG tablet Take 180 mg by mouth 2 (two) times daily.    [provider]  fluconazole (DIFLUCAN) 200 MG tablet Take 1 tablet (200 mg total) by mouth daily. 02/05/17   Gardenia Phlegm, NP  HYDROcodone-acetaminophen (NORCO/VICODIN) 5-325 MG tablet Take 1 tablet by mouth every 4 (four) hours as needed. Patient not taking: Reported on 02/05/2017 09/20/16   Isla Pence, MD  HYDROcodone-homatropine [facility]) 5-1.5 MG/5ML syrup Take 5 mLs by mouth every 6 (six) hours  as needed for cough. 01/24/17   Nicholas Lose, MD  hydrOXYzine (VISTARIL) 25 MG capsule Take 1 capsule (25 mg total) by mouth at bedtime. 07/13/16   Kennith Gain, MD  lidocaine-prilocaine (EMLA) cream Apply to  affected area once 02/05/17   Nicholas Lose, MD  Melatonin 10 MG TABS Take 10 mg by mouth at bedtime.     [provider]  ondansetron (ZOFRAN) 8 MG tablet Take 1 tablet (8 mg total) by mouth 2 (two) times daily as needed (Nausea or vomiting). 02/05/17   Nicholas Lose, MD  oxyCODONE-acetaminophen (PERCOCET) 5-325 MG tablet Take 2 tablets by mouth every 4 (four) hours as needed. Patient not taking: Reported on 02/05/2017 11/13/16   Charlesetta Shanks, MD  predniSONE (DELTASONE) 50 MG tablet Take 1 50mg  tablet 13 hrs prior to CT, 7hrs prior to CT, and 1 hr prior to CT. Patient not taking: Reported on 02/05/2017 01/24/17   Nicholas Lose, MD  prochlorperazine (COMPAZINE) 10 MG tablet Take 1 tablet (10 mg total) by mouth every 6 (six) hours as needed (Nausea or vomiting). 02/05/17   Nicholas Lose, MD     Vital Signs: BP 115/87 (BP Location: Right Arm)   Pulse 63   Temp 97.9 F (36.6 C) (Oral)   Resp 16   SpO2 100%   Physical Exam patient awake, alert. Chest with slightly diminished breath sounds bases, heart with normal rate, occ ectopy noted. Abdomen soft, positive bowel sounds, nontender. No lower extremity edema.  Imaging: No results found.  Labs:  CBC:  Recent Labs  11/20/16 0615 11/22/16 1440 01/07/17 1431 01/24/17 1344  WBC 5.3 5.8 7.9 9.5  HGB 11.2* 11.6* 10.7* 10.5*  HCT 36.0 36.3 32.9* 32.9*  PLT 413* 371 608* 509*    COAGS:  Recent Labs  11/20/16 0615  INR 0.97  APTT 33    BMP:  Recent Labs  07/24/16 0922 09/20/16 1858 11/13/16 0907 11/22/16 1440 01/07/17 1431 01/24/17 1344  NA 138 142 137 136 141 136  K 3.7 3.5 3.6 5.2* 3.4* 3.4*  CL 107 108 108 105  --   --   CO2 23 24 23 23 26 23   GLUCOSE 109* 113* 101* 90 120 100  BUN 11 12 13 12  13.5 20.9  CALCIUM 10.4* 10.3 10.2 9.9 10.6* 10.5*  CREATININE 0.82 0.78 0.68 0.65 0.9 0.7  GFRNONAA >60 >60 >60 >60  --   --   GFRAA >60 >60 >60 >60  --   --     LIVER FUNCTION TESTS:  Recent Labs  09/20/16 1858  11/22/16 1440 01/07/17 1431 01/24/17 1344  BILITOT 0.1* 1.5* 0.59 0.51  AST 25 35 16 26  ALT 30 27 19  46  ALKPHOS 65 71 91 122  PROT 7.1 7.0 7.8 7.4  ALBUMIN 3.8 3.2* 3.3* 2.7*    Assessment and Plan: Pt with history of metastatic left breast cancer , status post surgery /chemoradiation. She has evidence of  disease progression on recent imaging and presents today for Port-A-Cath placement for additional palliative chemotherapy. Risks and benefits discussed with the patient/daughter including, but not limited to bleeding, infection, pneumothorax, or fibrin sheath development and need for additional procedures. All of the patient's questions were answered, patient is agreeable to proceed.Consent signed and in chart.Labs pending.      Electronically Signed: D. Rowe Robert, PA-C 02/11/2017, 9:55 AM   I spent a total of 20 minutes  at the the patient's bedside AND on the patient's hospital floor or unit,  greater than 50% of which was counseling/coordinating care for Port-A-Cath placement

## 2017-02-11 NOTE — Procedures (Signed)
Interventional Radiology Procedure Note  Procedure: Placement of a right UE PICC.  DL.  Power injectable.   Tip is positioned at the superior cavoatrial junction and catheter is ready for immediate use.  Complications: None Recommendations:  - Ok to shower tomorrow - Do not submerge  - Routine line care  - Will tx to ED stable for evaluation of new discovered Afib + RVR.   Signed,  Dulcy Fanny. [name], DO

## 2017-02-11 NOTE — Progress Notes (Signed)
Patient ID: Cindy Bradshaw, female   DOB: [birth year], 46 y.o.   MRN: [id number] Due to cardiac ectopy noted on physical exam patient underwent EKG today which revealed atrial fibrillation with RVR. Patient has no known history of atrial fibrillation. She currently denies chest pain but does have some dyspnea with exertion and occasional cough. Above discussed with Drs. Gudena/Ashritha Desrosiers. Will plan to place PICC line instead Port-A-Cath today. She will be evaluated in the ED afterwards.  ATTEST:  Agree with note.   Patient reports ongoing SOB which has been somewhat worsening over the past few weeks.  She does endorse some lower chest/epigastric pain which she tells me she "thought was indigestion".    I have discussed with Dr. Lindi Adie.  Agree that patient should be evaluated now in the ED.    US guided PICC has been placed as an access to start her planned chemotherapy.   Have notified charge nurse in the ED.  Will be transferring stable to recess B. 12:56pm.   Signed,  Cindy Bradshaw. Cindy Newport, DO

## 2017-02-11 NOTE — Progress Notes (Signed)
CRITICAL VALUE ALERT  Critical Value: Troponin 0.03  Date & Time Notied:  2055  Provider Notified: [id number]  Orders Received/Actions taken: n/a (pt already has repeat lab order)

## 2017-02-11 NOTE — Progress Notes (Signed)
PHARMACIST - PHYSICIAN ORDER COMMUNICATION  CONCERNING: P&T Medication Policy on Herbal Medications  DESCRIPTION:  This patient's order for:  Melatonin  has been noted.  This product(s) is classified as an "herbal" or natural product. Due to a lack of definitive safety studies or FDA approval, nonstandard manufacturing practices, plus the potential risk of unknown drug-drug interactions while on inpatient medications, the Pharmacy and Therapeutics Committee does not permit the use of "herbal" or natural products of this type within [facility].   ACTION TAKEN: The pharmacy department is unable to verify this order at this time and your patient has been informed of this safety policy. Please reevaluate patient's clinical condition at discharge and address if the herbal or natural product(s) should be resumed at that time.  Eliyohu Class, PharmD  

## 2017-02-11 NOTE — ED Notes (Signed)
Bed: RESB Expected date:  Expected time:  Means of arrival:  Comments: EMS-AFIB/RVR

## 2017-02-11 NOTE — ED Triage Notes (Signed)
Pt was in IR for Port a cath placement, when found pt was in A-fib with RVR. IR placed a PICC in right arm. Left arm is restricted due to breast cancer.

## 2017-02-11 NOTE — H&P (Addendum)
History and Physical    Cindy Bradshaw XBM:[id number] DOB: 08-01-1971 DOA: 02/11/2017  Referring MD/NP/PA: Dr. Ashok Cordia  PCP: [name], MD   Outpatient Specialists: Oncology, Dr. Nicholas Lose   Patient coming from: home   Chief Complaint: atrial fibrillation, coming from radiation department   HPI: Cindy Bradshaw is a 46 y.o. female with medical history significant for upper left breast cancer, initial diagnosis in December 2016, status post neoadjuvant chemotherapy from January 2017 through May 2017, left lumpectomy 02/2016, RT to left breast 03/2016 - 05/2016, recurrence and mets to lungs 11/2016, started xeloda 12/20/2016, under the care of Dr. Lindi Adie. Pt was in radiation office to get PICC line placed and has developed atrial fibrillation. Patient reports very mild chest pain in the midsternal area, intermittent, about 2 out of 10 in intensity, nonradiating. Pain is present at rest. No specific alleviating or aggravating factors. No palpitations or shortness of breath. No other complaints such as abdominal pain, nausea or vomiting. No lightheadedness or dizziness or loss of consciousness. No urinary complaints.  ED Course: Patient was hemodynamically stable other than tachycardia 132. Blood work was relatively unremarkable except for hemoglobin of 10.1. The 12-lead EKG was significant for atrial fibrillation and patient was started on Cardizem drip which lowered the heart rate down to 120s. ED physician has consulted cardiology.  Review of Systems:  Constitutional: Negative for fever, chills, diaphoresis, activity change, appetite change and fatigue.  HENT: Negative for ear pain, nosebleeds, congestion, facial swelling, rhinorrhea, neck pain, neck stiffness and ear discharge.   Eyes: Negative for pain, discharge, redness, itching and visual disturbance.  Respiratory: Negative for cough, choking, chest tightness, shortness of breath, wheezing and stridor.   Cardiovascular:  Negative for palpitations and leg swelling.  Gastrointestinal: Negative for abdominal distention.  Genitourinary: Negative for dysuria, urgency, frequency, hematuria, flank pain, decreased urine volume, difficulty urinating and dyspareunia.  Musculoskeletal: Negative for back pain, joint swelling, arthralgias and gait problem.  Neurological: Negative for dizziness, tremors, seizures, syncope, facial asymmetry, speech difficulty, weakness, light-headedness, numbness and headaches.  Hematological: Negative for adenopathy. Does not bruise/bleed easily.  Psychiatric/Behavioral: Negative for hallucinations, behavioral problems, confusion, dysphoric mood, decreased concentration and agitation.   Past Medical History:  Diagnosis Date  . Arthritis   . Asthma   . Back disorder    degenerative disk disease  . Breast cancer ([place])   . Cancer ([place])   . GERD (gastroesophageal reflux disease)   . Mammogram abnormal 08/24/15   first  . Migraine   . Pap smear for cervical cancer screening 08/12/15    Past Surgical History:  Procedure Laterality Date  . BREAST LUMPECTOMY WITH RADIOACTIVE SEED AND SENTINEL LYMPH NODE BIOPSY Left 02/13/2016   Procedure: BREAST LUMPECTOMY WITH RADIOACTIVE SEED AND SENTINEL LYMPH NODE BIOPSY;  Surgeon: Excell Seltzer, MD;  Location: [facility];  Service: General;  Laterality: Left;  . BREAST REDUCTION SURGERY Bilateral 02/21/2016   Procedure: MAMMARY REDUCTION  (BREAST)/Oncoplastic breast reconstruction;  Surgeon: Irene Limbo, MD;  Location: [facility];  Service: Plastics;  Laterality: Bilateral;  . CARDIAC CATHETERIZATION [name] 01/24/2016   Procedure: Left Heart Cath and Coronary Angiography;  Surgeon: Leonie Man, MD;  Location: [facility];  Service: Cardiovascular;  Laterality: [name];  . CESAREAN SECTION    . CHOLECYSTECTOMY    . IR FLUORO GUIDE CV LINE RIGHT  02/11/2017  . IR US GUIDE VASC ACCESS RIGHT  02/11/2017  .  PERIPHERAL VASCULAR CATHETERIZATION Right 02/13/2016  Procedure: PORTA CATH REMOVAL;  Surgeon: Excell Seltzer, MD;  Location: [facility];  Service: General;  Laterality: Right;  . PORTACATH PLACEMENT [name] 09/12/2015   Procedure: INSERTION PORT-A-CATH;  Surgeon: Excell Seltzer, MD;  Location: [facility];  Service: General;  Laterality: [name];    Social history:  reports that she has never smoked. She has never used smokeless tobacco. She reports that she drinks alcohol. She reports that she does not use drugs.  Ambulation: ambulates without assistance at baseline.  Allergies  Allergen Reactions  . Dilaudid [Hydromorphone Hcl] Itching  . Ivp Dye [Iodinated Diagnostic Agents] Itching  . Percocet [Oxycodone-Acetaminophen] Itching  . Tape Itching and Rash  . Toradol [Ketorolac Tromethamine] Itching    Family History  Problem Relation Age of Onset  . Lung cancer Mother 43       2 different types of lung cancer; metastasis to brain; smoker  . Other Mother        hx of hysterectomy for unspecified reason  . Bone cancer Maternal Uncle        dx. early 62s  . Breast cancer Maternal Grandmother        dx. early 39s, s/p mastectomy  . Cancer Paternal Grandfather        unspecified type of cancer, dx. late 23s  . Congestive Heart Failure Father        smoker  . Stroke Father   . Eczema Father   . Cirrhosis Maternal Grandfather   . Heart Problems Maternal Grandfather   . Asthma Daughter   . Allergies Daughter        hives  . Lung cancer Other        (maternal great uncle; MGM's brother); had a coal stove  . Cancer Cousin        unspecified type; d. early age (paternal first cousin once-removed)  . Cancer Cousin        dx. as a kid; in remission today; (paternal 2nd cousin)  . Cancer Cousin        unspecified type; d. early 85s; (maternal 1st cousin)    Prior to Admission medications   Medication Sig Start Date End Date Taking? Authorizing Provider  albuterol  (PROVENTIL HFA;VENTOLIN HFA) 108 (90 Base) MCG/ACT inhaler Inhale 1-2 puffs into the lungs every 6 (six) hours as needed for wheezing or shortness of breath. 07/24/16  Yes Waynetta Pean, PA-C  albuterol (PROVENTIL) (2.5 MG/3ML) 0.083% nebulizer solution Take 2.5 mg by nebulization every 6 (six) hours as needed for wheezing or shortness of breath.   Yes [provider]  capecitabine (XELODA) 500 MG tablet Take 3 tablets (1,500 mg total) by mouth 2 (two) times daily after a meal. 14 days on 7 days off 11/27/16  Yes Nicholas Lose, MD  dexamethasone (DECADRON) 4 MG tablet Take 1 tablet (4 mg total) by mouth daily. 02/05/17  Yes Causey, Charlestine Massed, NP  fluconazole (DIFLUCAN) 200 MG tablet Take 1 tablet (200 mg total) by mouth daily. 02/05/17  Yes Causey, Charlestine Massed, NP  Melatonin 10 MG TABS Take 10 mg by mouth at bedtime.    Yes [provider]  fexofenadine (ALLEGRA) 180 MG tablet Take 180 mg by mouth 2 (two) times daily.    [provider]  HYDROcodone-acetaminophen (NORCO/VICODIN) 5-325 MG tablet Take 1 tablet by mouth every 4 (four) hours as needed. Patient not taking: Reported on 02/05/2017 09/20/16   Isla Pence, MD  HYDROcodone-homatropine [facility]) 5-1.5 MG/5ML syrup Take 5 mLs by  mouth every 6 (six) hours as needed for cough. Patient not taking: Reported on 02/11/2017 01/24/17   Nicholas Lose, MD  hydrOXYzine (VISTARIL) 25 MG capsule Take 1 capsule (25 mg total) by mouth at bedtime. Patient not taking: Reported on 02/11/2017 07/13/16   Kennith Gain, MD  lidocaine-prilocaine (EMLA) cream Apply to affected area once Patient not taking: Reported on 02/11/2017 02/05/17   Nicholas Lose, MD  ondansetron (ZOFRAN) 8 MG tablet Take 1 tablet (8 mg total) by mouth 2 (two) times daily as needed (Nausea or vomiting). Patient not taking: Reported on 02/11/2017 02/05/17   Nicholas Lose, MD  oxyCODONE-acetaminophen (PERCOCET) 5-325 MG tablet Take 2 tablets by mouth every  4 (four) hours as needed. Patient not taking: Reported on 02/05/2017 11/13/16   Charlesetta Shanks, MD  predniSONE (DELTASONE) 50 MG tablet Take 1 14m tablet 13 hrs prior to CT, 7hrs prior to CT, and 1 hr prior to CT. Patient not taking: Reported on 02/05/2017 01/24/17   GNicholas Lose MD  prochlorperazine (COMPAZINE) 10 MG tablet Take 1 tablet (10 mg total) by mouth every 6 (six) hours as needed (Nausea or vomiting). Patient not taking: Reported on 02/11/2017 02/05/17   GNicholas Lose MD    Physical Exam: Vitals:   02/11/17 1309 02/11/17 1319 02/11/17 1345  BP: 105/72  116/83  Pulse: (!) 126  (!) 120  Resp: 18  18  Temp: 97.8 F (36.6 C)    TempSrc: Oral    SpO2: 99%  100%  Weight:  93.4 kg (206 lb)   Height:  5' 7"  (1.702 m)     Constitutional: NAD, calm, comfortable Vitals:   02/11/17 1309 02/11/17 1319 02/11/17 1345  BP: 105/72  116/83  Pulse: (!) 126  (!) 120  Resp: 18  18  Temp: 97.8 F (36.6 C)    TempSrc: Oral    SpO2: 99%  100%  Weight:  93.4 kg (206 lb)   Height:  5' 7"  (1.702 m)    Eyes: PERRL, lids and conjunctivae normal ENMT: Mucous membranes are moist. Posterior pharynx clear of any exudate or lesions.Normal dentition.  Neck: normal, supple, no masses, no thyromegaly Respiratory: clear to auscultation bilaterally, no wheezing, no crackles. Normal respiratory effort. No accessory muscle use.  Cardiovascular: Irregularly irregular rhythm, tachycardic, appreciate S1-S2 Abdomen: no tenderness, no masses palpated. No hepatosplenomegaly. Bowel sounds positive.  Musculoskeletal: no clubbing / cyanosis. No joint deformity upper and lower extremities. Good ROM, no contractures. Normal muscle tone.  Skin: no rashes, lesions, ulcers. No induration Neurologic: CN 2-12 grossly intact. Sensation intact, DTR normal. Strength 5/5 in all 4.  Psychiatric: Normal judgment and insight. Alert and oriented x 3. Normal mood.    Labs on Admission: I have personally reviewed following labs  and imaging studies  CBC:  Recent Labs Lab 02/11/17 1015 02/11/17 1339  WBC 7.2 6.4  HGB 10.1* 10.1*  HCT 31.6* 31.7*  MCV 86.3 86.1  PLT 455* 4045   Basic Metabolic Panel:  Recent Labs Lab 02/11/17 1339  NA 133*  K 4.3  CL 100*  CO2 27  GLUCOSE 99  BUN 25*  CREATININE 0.62  CALCIUM 10.3   GFR: Estimated Creatinine Clearance: 102 mL/min (by C-G formula based on SCr of 0.62 mg/dL). Liver Function Tests: No results for input(s): AST, ALT, ALKPHOS, BILITOT, PROT, ALBUMIN in the last 168 hours. No results for input(s): LIPASE, AMYLASE in the last 168 hours. No results for input(s): AMMONIA in the last 168 hours. Coagulation Profile:  Recent Labs Lab 02/11/17 1015  INR 1.13   Cardiac Enzymes: No results for input(s): CKTOTAL, CKMB, CKMBINDEX, TROPONINI in the last 168 hours. BNP (last 3 results) No results for input(s): PROBNP in the last 8760 hours. HbA1C: No results for input(s): HGBA1C in the last 72 hours. CBG: No results for input(s): GLUCAP in the last 168 hours. Lipid Profile: No results for input(s): CHOL, HDL, LDLCALC, TRIG, CHOLHDL, LDLDIRECT in the last 72 hours. Thyroid Function Tests:  Recent Labs  02/11/17 1339  TSH 0.453   Anemia Panel: No results for input(s): VITAMINB12, FOLATE, FERRITIN, TIBC, IRON, RETICCTPCT in the last 72 hours. Urine analysis:    Component Value Date/Time   COLORURINE YELLOW 07/03/2016 0557   APPEARANCEUR CLOUDY (A) 07/03/2016 0557   LABSPEC 1.021 07/03/2016 0557   LABSPEC 1.010 04/10/2016 1224   PHURINE 6.0 07/03/2016 0557   GLUCOSEU NEGATIVE 07/03/2016 0557   GLUCOSEU Negative 04/10/2016 1224   HGBUR SMALL (A) 07/03/2016 0557   BILIRUBINUR NEGATIVE 07/03/2016 0557   BILIRUBINUR Negative 04/10/2016 [place] 07/03/2016 0557   PROTEINUR NEGATIVE 07/03/2016 0557   UROBILINOGEN 0.2 04/10/2016 1224   NITRITE NEGATIVE 07/03/2016 0557   LEUKOCYTESUR NEGATIVE 07/03/2016 0557   LEUKOCYTESUR  Negative 04/10/2016 1224   Sepsis Labs: @LABRCNTIP (procalcitonin:4,lacticidven:4) )No results found for this or any previous visit (from the past 240 hour(s)).   Radiological Exams on Admission: Ir Fluoro Guide Cv Line Right  Result Date: 02/11/2017 INDICATION: 46 year old female with a history of metastatic breast carcinoma. She presents for a port catheter placement to initiate chemotherapy. Upon presentation atrial fibrillation with RVR was discovered, which is her first presentation. No history of cardiac problems. Plan is for PICC placement with future port. EXAM: PICC LINE PLACEMENT WITH ULTRASOUND AND FLUOROSCOPIC GUIDANCE MEDICATIONS: None ANESTHESIA/SEDATION: None FLUOROSCOPY TIME:  Fluoroscopy Time: 0 minutes 6 seconds COMPLICATIONS: None PROCEDURE: Informed written consent was obtained from the patient after a thorough discussion of the procedural risks, benefits and alternatives. All questions were addressed. Maximal Sterile Barrier Technique was utilized including caps, mask, sterile gowns, sterile gloves, sterile drape, hand hygiene and skin antiseptic. A timeout was performed prior to the initiation of the procedure. Patient was position in the supine position on the fluoroscopy table with the right arm abducted 90 degrees. Ultrasound survey of the upper extremity was performed with images stored and sent to PACs. The right brachial vein was selected for access. Once the patient was prepped and draped in the usual sterile fashion, the skin and subcutaneous tissues were generously infiltrated with 1% lidocaine for local anesthesia. A micropuncture access kit was then used to access the targeted vein. Wire was passed centrally, confirmed to be within the venous system under fluoroscopy. A small stab incision was made with an 11 blade scalpel and the sheath was then placed over the wire. Estimated length of the catheter was then performed with the indwelling wire. Catheter was amputated at 41 cm  length and placed with coaxial wire through the peel-away. Double-lumen, power injectable PICC in the brachial vein. Tip confirmed at the cavoatrial junction, and the catheter is ready for use. Stat lock was placed. Patient tolerated the procedure well and remained hemodynamically stable throughout. No complications were encountered and no significant blood loss was encountered. IMPRESSION: Status post right brachial vein PICC.  Catheter ready for use. Signed, Dulcy Fanny. [name], DO [facility] Radiology Electronically Signed   By: Corrie Mckusick D.O.   On: 02/11/2017 13:10  Ir US Guide Vasc Access Right  Result Date: 02/11/2017 INDICATION: 46 year old female with a history of metastatic breast carcinoma. She presents for a port catheter placement to initiate chemotherapy. Upon presentation atrial fibrillation with RVR was discovered, which is her first presentation. No history of cardiac problems. Plan is for PICC placement with future port. EXAM: PICC LINE PLACEMENT WITH ULTRASOUND AND FLUOROSCOPIC GUIDANCE MEDICATIONS: None ANESTHESIA/SEDATION: None FLUOROSCOPY TIME:  Fluoroscopy Time: 0 minutes 6 seconds COMPLICATIONS: None PROCEDURE: Informed written consent was obtained from the patient after a thorough discussion of the procedural risks, benefits and alternatives. All questions were addressed. Maximal Sterile Barrier Technique was utilized including caps, mask, sterile gowns, sterile gloves, sterile drape, hand hygiene and skin antiseptic. A timeout was performed prior to the initiation of the procedure. Patient was position in the supine position on the fluoroscopy table with the right arm abducted 90 degrees. Ultrasound survey of the upper extremity was performed with images stored and sent to PACs. The right brachial vein was selected for access. Once the patient was prepped and draped in the usual sterile fashion, the skin and subcutaneous tissues were  generously infiltrated with 1% lidocaine for local anesthesia. A micropuncture access kit was then used to access the targeted vein. Wire was passed centrally, confirmed to be within the venous system under fluoroscopy. A small stab incision was made with an 11 blade scalpel and the sheath was then placed over the wire. Estimated length of the catheter was then performed with the indwelling wire. Catheter was amputated at 41 cm length and placed with coaxial wire through the peel-away. Double-lumen, power injectable PICC in the brachial vein. Tip confirmed at the cavoatrial junction, and the catheter is ready for use. Stat lock was placed. Patient tolerated the procedure well and remained hemodynamically stable throughout. No complications were encountered and no significant blood loss was encountered. IMPRESSION: Status post right brachial vein PICC.  Catheter ready for use. Signed, Dulcy Fanny. [name], DO [facility] Radiology Electronically Signed   By: Corrie Mckusick D.O.   On: 02/11/2017 13:10    EKG: Atrial fibrillation  Assessment/Plan  Active Problems:   Atrial fibrillation, new onset (HCC)  - CHADS vasc score 1 - Started Cardizem drip - Started aspirin daily - Cardiology consulted - Obtain 2-D echo    Mild chest pain at rest - Likely secondary to demand ischemia from atrial fibrillation - Cycle cardiac enzymes - Started aspirin daily - Obtain 2-D echo    Left breast cancer - On chemotherapy - Management per oncology    Anemia of chronic disease - Likely secondary to malignancy and sequela of chemotherapy - Hemoglobin stable   DVT prophylaxis: SCDs Code Status: Full code Family Communication: No family at the bedside Disposition Plan: Admission to stepdown unit Consults called: Cardiology  Disposition plan: Further plan will depend as patient's clinical course evolves and further radiologic and laboratory data become available.     At the time of admission, it appears that the appropriate admission status for this patient is INPATIENT .Thisis judged to be reasonable and necessary in order to provide the required intensity of service to ensure the patient's safetygiven the patient presentation of new onset atrial fibrillation in addition to physical exam findings, radiographic and laboratory data in the context of chronic comorbidities.      Leisa Lenz MD [facility] Pager [phone]  If 7PM-7AM, please contact night-coverage [email or web address] Password [facility]  02/11/2017, 2:49 PM

## 2017-02-11 NOTE — ED Provider Notes (Addendum)
[facility] DEPT Provider Note   CSN: [id number] Arrival date & time: 02/11/17  1307     History   Chief Complaint Chief Complaint  Patient presents with  . Atrial Fibrillation    HPI Lylith K. Pilger is a 46 y.o. female.  Patient sent from radiology - was sent for afib, new onset, of unclear duration.  Patient with hx metastatic breast ca, was in radiology to have port in anticipation of start of chemo.  They noted hr 140's, and placed picc line instead.  Patient unclear when onset of afib occurred. She has no palpitations or sense of rapid heart beat. No chest pain or sob.  No hx afib, or other cardiac issues.     Atrial Fibrillation  Pertinent negatives include no chest pain, no abdominal pain, no headaches and no shortness of breath.    Past Medical History:  Diagnosis Date  . Arthritis   . Asthma   . Back disorder    degenerative disk disease  . Breast cancer ([place])   . Cancer ([place])   . GERD (gastroesophageal reflux disease)   . Mammogram abnormal 08/24/15   first  . Migraine   . Pap smear for cervical cancer screening 08/12/15    Patient Active Problem List   Diagnosis Date Noted  . Allergic reaction   . Pruritus 04/23/2016  . Chest pain   . Diaphoresis   . Lightheadedness   . Pain in the chest   . SOB (shortness of breath)   . Abnormal nuclear stress test   . Unstable angina ([place]) 01/21/2016  . Right ankle pain 01/21/2016  . Nausea with vomiting 01/04/2016  . Adnexal cyst: Right per CT 12/09/2015 12/12/2015  . Asthma 12/09/2015  . Abdominal pain 12/09/2015  . Chemotherapy-induced peripheral neuropathy ([place]) 12/05/2015  . Genetic testing 10/13/2015  . Dehydration 09/25/2015  . Family history of breast cancer in female 09/20/2015  . Normocytic normochromic anemia 09/19/2015  . Breast cancer of upper-outer quadrant of left female breast ([place]) 09/07/2015    Past Surgical History:  Procedure Laterality Date  . BREAST LUMPECTOMY WITH RADIOACTIVE SEED  AND SENTINEL LYMPH NODE BIOPSY Left 02/13/2016   Procedure: BREAST LUMPECTOMY WITH RADIOACTIVE SEED AND SENTINEL LYMPH NODE BIOPSY;  Surgeon: Excell Seltzer, MD;  Location: [facility];  Service: General;  Laterality: Left;  . BREAST REDUCTION SURGERY Bilateral 02/21/2016   Procedure: MAMMARY REDUCTION  (BREAST)/Oncoplastic breast reconstruction;  Surgeon: Irene Limbo, MD;  Location: [facility];  Service: Plastics;  Laterality: Bilateral;  . CARDIAC CATHETERIZATION [name] 01/24/2016   Procedure: Left Heart Cath and Coronary Angiography;  Surgeon: Leonie Man, MD;  Location: [facility];  Service: Cardiovascular;  Laterality: [name];  . CESAREAN SECTION    . CHOLECYSTECTOMY    . IR FLUORO GUIDE CV LINE RIGHT  02/11/2017  . IR US GUIDE VASC ACCESS RIGHT  02/11/2017  . PERIPHERAL VASCULAR CATHETERIZATION Right 02/13/2016   Procedure: PORTA CATH REMOVAL;  Surgeon: Excell Seltzer, MD;  Location: [facility];  Service: General;  Laterality: Right;  . PORTACATH PLACEMENT [name] 09/12/2015   Procedure: INSERTION PORT-A-CATH;  Surgeon: Excell Seltzer, MD;  Location: [facility];  Service: General;  Laterality: [name];    OB History    Gravida Para Term Preterm AB Living   0 0 0 0 0     SAB TAB Ectopic Multiple Live Births   0 0 0  Home Medications    Prior to Admission medications   Medication Sig Start Date End Date Taking? Authorizing Provider  albuterol (PROVENTIL HFA;VENTOLIN HFA) 108 (90 Base) MCG/ACT inhaler Inhale 1-2 puffs into the lungs every 6 (six) hours as needed for wheezing or shortness of breath. 07/24/16   Waynetta Pean, PA-C  albuterol (PROVENTIL) (2.5 MG/3ML) 0.083% nebulizer solution Take 2.5 mg by nebulization every 6 (six) hours as needed for wheezing or shortness of breath.    [provider]  capecitabine (XELODA) 500 MG tablet Take 3 tablets (1,500 mg total) by mouth 2 (two) times daily after a meal. 14 days  on 7 days off 11/27/16   Nicholas Lose, MD  dexamethasone (DECADRON) 4 MG tablet Take 1 tablet (4 mg total) by mouth daily. 02/05/17   Gardenia Phlegm, NP  fexofenadine (ALLEGRA) 180 MG tablet Take 180 mg by mouth 2 (two) times daily.    [provider]  fluconazole (DIFLUCAN) 200 MG tablet Take 1 tablet (200 mg total) by mouth daily. 02/05/17   Gardenia Phlegm, NP  HYDROcodone-acetaminophen (NORCO/VICODIN) 5-325 MG tablet Take 1 tablet by mouth every 4 (four) hours as needed. Patient not taking: Reported on 02/05/2017 09/20/16   Isla Pence, MD  HYDROcodone-homatropine [facility]) 5-1.5 MG/5ML syrup Take 5 mLs by mouth every 6 (six) hours as needed for cough. 01/24/17   Nicholas Lose, MD  hydrOXYzine (VISTARIL) 25 MG capsule Take 1 capsule (25 mg total) by mouth at bedtime. 07/13/16   Kennith Gain, MD  lidocaine-prilocaine (EMLA) cream Apply to affected area once 02/05/17   Nicholas Lose, MD  Melatonin 10 MG TABS Take 10 mg by mouth at bedtime.     [provider]  ondansetron (ZOFRAN) 8 MG tablet Take 1 tablet (8 mg total) by mouth 2 (two) times daily as needed (Nausea or vomiting). 02/05/17   Nicholas Lose, MD  oxyCODONE-acetaminophen (PERCOCET) 5-325 MG tablet Take 2 tablets by mouth every 4 (four) hours as needed. Patient not taking: Reported on 02/05/2017 11/13/16   Charlesetta Shanks, MD  predniSONE (DELTASONE) 50 MG tablet Take 1 36m tablet 13 hrs prior to CT, 7hrs prior to CT, and 1 hr prior to CT. Patient not taking: Reported on 02/05/2017 01/24/17   GNicholas Lose MD  prochlorperazine (COMPAZINE) 10 MG tablet Take 1 tablet (10 mg total) by mouth every 6 (six) hours as needed (Nausea or vomiting). 02/05/17   GNicholas Lose MD    Family History Family History  Problem Relation Age of Onset  . Lung cancer Mother 650      2 different types of lung cancer; metastasis to brain; smoker  . Other Mother        hx of hysterectomy for unspecified reason  . Bone  cancer Maternal Uncle        dx. early 737s . Breast cancer Maternal Grandmother        dx. early 523s s/p mastectomy  . Cancer Paternal Grandfather        unspecified type of cancer, dx. late 620s . Congestive Heart Failure Father        smoker  . Stroke Father   . Eczema Father   . Cirrhosis Maternal Grandfather   . Heart Problems Maternal Grandfather   . Asthma Daughter   . Allergies Daughter        hives  . Lung cancer Other        (maternal great uncle; MGM's brother); had a coal stove  .  Cancer Cousin        unspecified type; d. early age (paternal first cousin once-removed)  . Cancer Cousin        dx. as a kid; in remission today; (paternal 2nd cousin)  . Cancer Cousin        unspecified type; d. early 4s; (maternal 1st cousin)    Social History Social History  Substance Use Topics  . Smoking status: Never Smoker  . Smokeless tobacco: Never Used  . Alcohol use 0.0 oz/week     Comment: occassional glass of wine     Allergies   Dilaudid [hydromorphone hcl]; Ivp dye [iodinated diagnostic agents]; Percocet [oxycodone-acetaminophen]; Tape; and Toradol [ketorolac tromethamine]   Review of Systems Review of Systems  Constitutional: Negative for chills and fever.  HENT: Negative for sore throat.   Eyes: Negative for redness.  Respiratory: Negative for shortness of breath.   Cardiovascular: Negative for chest pain, palpitations and leg swelling.  Gastrointestinal: Negative for abdominal pain and vomiting.  Genitourinary: Negative for flank pain.  Musculoskeletal: Negative for back pain and neck pain.  Skin: Negative for rash.  Neurological: Negative for headaches.  Hematological: Does not bruise/bleed easily.  Psychiatric/Behavioral: Negative for confusion.     Physical Exam Updated Vital Signs BP 105/72 (BP Location: Left Leg)   Pulse (!) 126   Temp 97.8 F (36.6 C) (Oral)   Resp 18   Ht 1.702 m (5' 7" )   Wt 93.4 kg (206 lb)   SpO2 99%   BMI 32.26  kg/m   Physical Exam  Constitutional: She appears well-developed and well-nourished. No distress.  HENT:  Mouth/Throat: Oropharynx is clear and moist.  Eyes: Conjunctivae are normal. No scleral icterus.  Neck: Neck supple. No tracheal deviation present. No thyromegaly present.  Cardiovascular: Normal rate, normal heart sounds and intact distal pulses.  Exam reveals no gallop and no friction rub.   No murmur heard. Tachycardic, irregular   Pulmonary/Chest: Effort normal and breath sounds normal. No respiratory distress.  Abdominal: Soft. Normal appearance and bowel sounds are normal. She exhibits no distension. There is no tenderness.  Musculoskeletal: She exhibits no edema.  Neurological: She is alert.  Skin: Skin is warm and dry. No rash noted. She is not diaphoretic.  Psychiatric: She has a normal mood and affect.  Nursing note and vitals reviewed.    ED Treatments / Results  Labs (all labs ordered are listed, but only abnormal results are displayed) Results for orders placed or performed during the hospital encounter of 02/11/17  CBC  Result Value Ref Range   WBC 6.4 4.0 - 10.5 K/uL   RBC 3.68 (L) 3.87 - 5.11 MIL/uL   Hemoglobin 10.1 (L) 12.0 - 15.0 g/dL   HCT 31.7 (L) 36.0 - 46.0 %   MCV 86.1 78.0 - 100.0 fL   MCH 27.4 26.0 - 34.0 pg   MCHC 31.9 30.0 - 36.0 g/dL   RDW 17.6 (H) 11.5 - 15.5 %   Platelets 409 (H) 150 - 400 K/uL  Basic metabolic panel  Result Value Ref Range   Sodium 133 (L) 135 - 145 mmol/L   Potassium 4.3 3.5 - 5.1 mmol/L   Chloride 100 (L) 101 - 111 mmol/L   CO2 27 22 - 32 mmol/L   Glucose, Bld 99 65 - 99 mg/dL   BUN 25 (H) 6 - 20 mg/dL   Creatinine, Ser 0.62 0.44 - 1.00 mg/dL   Calcium 10.3 8.9 - 10.3 mg/dL   GFR calc non  Af Amer >60 >60 mL/min   GFR calc Af Amer >60 >60 mL/min   Anion gap 6 5 - 15  I-stat troponin, ED  Result Value Ref Range   Troponin i, poc 0.02 0.00 - 0.08 ng/mL   Comment 3           Dg Chest 2 View  Result Date:  01/24/2017 CLINICAL DATA:  46 year old female with metastatic breast cancer. Shortness of breath for 4 days. EXAM: CHEST  2 VIEW COMPARISON:  PET-CT 12/04/2016, chest radiographs 11/22/2016, and earlier FINDINGS: Multiple rounded nodular bilateral mid and lower lung pulmonary metastases appear larger and denser than on 11/22/2016. Associated worsening lung base ventilation Stable lung volumes. Mild interval hilar enlargement. Other mediastinal contours remain stable. Visualized tracheal air column is within normal limits. No pneumothorax or layering pleural effusion. There does appear to be trace pleural fluid in the fissures which has not definitely changed. No definite consolidation. Stable visualized osseous structures. Stable cholecystectomy clips. Negative visible bowel gas pattern. Stable surgical clips at the left axilla and chest wall. IMPRESSION: 1. Radiographically progressed bilateral mid and lower lung metastases since March with associated worsening lung base ventilation. 2. No drainable pleural fluid or other acute cardiopulmonary abnormality identified. Electronically Signed   By: Genevie Ann M.D.   On: 01/24/2017 14:25   Ct Chest W Contrast  Result Date: 01/30/2017 CLINICAL DATA:  Metastatic left breast cancer EXAM: CT CHEST, ABDOMEN, AND PELVIS WITH CONTRAST TECHNIQUE: Multidetector CT imaging of the chest, abdomen and pelvis was performed following the standard protocol during bolus administration of intravenous contrast. CONTRAST:  13m ISOVUE-300 IOPAMIDOL (ISOVUE-300) INJECTION 61% COMPARISON:  12/04/2016 FINDINGS: CT CHEST FINDINGS Cardiovascular: No acute aortic findings. Extrinsic mass effect on portions of the pulmonary arterial tree due to extensive hilar and mediastinal adenopathy. Mediastinum/Nodes: Extensive than worsened prevascular, AP window, paratracheal, hilar, infrahilar, and internal mammary adenopathy. Conglomerate AP window adenopathy measures 3.6 cm in short axis, previously  2.8 cm. A subcarinal node measures 2.6 cm in short axis, previously 2.2 cm. The size comparisons are made to the prior PET-CT from 12/04/2016. Prior left axillary dissection. Lungs/Pleura: Compared to the prior PET-CT, there is increase in size and number of scattered pulmonary nodules and masses. For example, a pleural-based mass along the lingula measures 1.9 by 2.2 cm, previously 1.5 by 1.0 cm. A masslike density along the right hemidiaphragm measures 8.0 by 5.5 cm on image 93/4, previously 3.0 by 2.3 cm. One example of a new nodule is the 5 mm pleural-based nodule on image 42 series 4 along the right upper lobe. There many an additional new pulmonary nodules. Trace bilateral pleural effusions with pleural masses favoring malignant effusions. Musculoskeletal: New and enlarging nodules and masses along the right breast. A right inferior breast mass measures 4.3 by 3.8 cm on image 42/2 and was previously 1.4 cm in diameter. A medial mass in the right breast measures 2.8 by 1.7 cm on image 32/2 and was previously 0.8 cm in diameter. A new nodule centrally in the right breast measures 1.0 cm in diameter on image 21/2. Other breast and subcutaneous nodules are present on the right. Thoracic spondylosis. CT ABDOMEN PELVIS FINDINGS Hepatobiliary: Mild intrahepatic biliary dilatation. Common bile duct measures 1.2 cm in diameter. No focal hepatic lesion identified. Pancreas: Unremarkable Spleen: Unremarkable Adrenals/Urinary Tract: Unremarkable Stomach/Bowel: Prominent stool throughout the colon favors constipation. Vascular/Lymphatic: Unremarkable Reproductive: Unremarkable Other: Scattered new subcutaneous nodules along the abdomen are worrisome for subcutaneous deposits of tumor. An index  lesion measures 1.5 by 0.9 cm on image 73 of series 2. These are new compared to the prior exam. Musculoskeletal: Lumbar spondylosis and degenerative disc disease with right foraminal impingement at L5-S1 due to spurring, image 93/6.  Transitional S1 vertebra. IMPRESSION: 1. Worsened tumor burden with significant abnormal nodal enlargement in the chest; new and enlarged pulmonary nodules and masses; new and enlarged right breast and subcutaneous masses and nodules, compatible with metastatic disease. 2. Trace bilateral pleural effusions may be malignant given the visible pleural nodules. 3.  Prominent stool throughout the colon favors constipation. 4. Mild biliary dilatation, some of which may be a physiologic response to cholecystectomy. 5. Right foraminal impingement at L5-S1 due to spurring. Electronically Signed   By: Van Clines M.D.   On: 01/30/2017 15:18   Ct Abdomen Pelvis W Contrast  Result Date: 01/30/2017 CLINICAL DATA:  Metastatic left breast cancer EXAM: CT CHEST, ABDOMEN, AND PELVIS WITH CONTRAST TECHNIQUE: Multidetector CT imaging of the chest, abdomen and pelvis was performed following the standard protocol during bolus administration of intravenous contrast. CONTRAST:  134m ISOVUE-300 IOPAMIDOL (ISOVUE-300) INJECTION 61% COMPARISON:  12/04/2016 FINDINGS: CT CHEST FINDINGS Cardiovascular: No acute aortic findings. Extrinsic mass effect on portions of the pulmonary arterial tree due to extensive hilar and mediastinal adenopathy. Mediastinum/Nodes: Extensive than worsened prevascular, AP window, paratracheal, hilar, infrahilar, and internal mammary adenopathy. Conglomerate AP window adenopathy measures 3.6 cm in short axis, previously 2.8 cm. A subcarinal node measures 2.6 cm in short axis, previously 2.2 cm. The size comparisons are made to the prior PET-CT from 12/04/2016. Prior left axillary dissection. Lungs/Pleura: Compared to the prior PET-CT, there is increase in size and number of scattered pulmonary nodules and masses. For example, a pleural-based mass along the lingula measures 1.9 by 2.2 cm, previously 1.5 by 1.0 cm. A masslike density along the right hemidiaphragm measures 8.0 by 5.5 cm on image 93/4,  previously 3.0 by 2.3 cm. One example of a new nodule is the 5 mm pleural-based nodule on image 42 series 4 along the right upper lobe. There many an additional new pulmonary nodules. Trace bilateral pleural effusions with pleural masses favoring malignant effusions. Musculoskeletal: New and enlarging nodules and masses along the right breast. A right inferior breast mass measures 4.3 by 3.8 cm on image 42/2 and was previously 1.4 cm in diameter. A medial mass in the right breast measures 2.8 by 1.7 cm on image 32/2 and was previously 0.8 cm in diameter. A new nodule centrally in the right breast measures 1.0 cm in diameter on image 21/2. Other breast and subcutaneous nodules are present on the right. Thoracic spondylosis. CT ABDOMEN PELVIS FINDINGS Hepatobiliary: Mild intrahepatic biliary dilatation. Common bile duct measures 1.2 cm in diameter. No focal hepatic lesion identified. Pancreas: Unremarkable Spleen: Unremarkable Adrenals/Urinary Tract: Unremarkable Stomach/Bowel: Prominent stool throughout the colon favors constipation. Vascular/Lymphatic: Unremarkable Reproductive: Unremarkable Other: Scattered new subcutaneous nodules along the abdomen are worrisome for subcutaneous deposits of tumor. An index lesion measures 1.5 by 0.9 cm on image 73 of series 2. These are new compared to the prior exam. Musculoskeletal: Lumbar spondylosis and degenerative disc disease with right foraminal impingement at L5-S1 due to spurring, image 93/6. Transitional S1 vertebra. IMPRESSION: 1. Worsened tumor burden with significant abnormal nodal enlargement in the chest; new and enlarged pulmonary nodules and masses; new and enlarged right breast and subcutaneous masses and nodules, compatible with metastatic disease. 2. Trace bilateral pleural effusions may be malignant given the visible pleural nodules. 3.  Prominent stool throughout the colon favors constipation. 4. Mild biliary dilatation, some of which may be a physiologic  response to cholecystectomy. 5. Right foraminal impingement at L5-S1 due to spurring. Electronically Signed   By: Van Clines M.D.   On: 01/30/2017 15:18   Ir Fluoro Guide Cv Line Right  Result Date: 02/11/2017 INDICATION: 46 year old female with a history of metastatic breast carcinoma. She presents for a port catheter placement to initiate chemotherapy. Upon presentation atrial fibrillation with RVR was discovered, which is her first presentation. No history of cardiac problems. Plan is for PICC placement with future port. EXAM: PICC LINE PLACEMENT WITH ULTRASOUND AND FLUOROSCOPIC GUIDANCE MEDICATIONS: None ANESTHESIA/SEDATION: None FLUOROSCOPY TIME:  Fluoroscopy Time: 0 minutes 6 seconds COMPLICATIONS: None PROCEDURE: Informed written consent was obtained from the patient after a thorough discussion of the procedural risks, benefits and alternatives. All questions were addressed. Maximal Sterile Barrier Technique was utilized including caps, mask, sterile gowns, sterile gloves, sterile drape, hand hygiene and skin antiseptic. A timeout was performed prior to the initiation of the procedure. Patient was position in the supine position on the fluoroscopy table with the right arm abducted 90 degrees. Ultrasound survey of the upper extremity was performed with images stored and sent to PACs. The right brachial vein was selected for access. Once the patient was prepped and draped in the usual sterile fashion, the skin and subcutaneous tissues were generously infiltrated with 1% lidocaine for local anesthesia. A micropuncture access kit was then used to access the targeted vein. Wire was passed centrally, confirmed to be within the venous system under fluoroscopy. A small stab incision was made with an 11 blade scalpel and the sheath was then placed over the wire. Estimated length of the catheter was then performed with the indwelling wire. Catheter was amputated at 41 cm length and placed with coaxial wire  through the peel-away. Double-lumen, power injectable PICC in the brachial vein. Tip confirmed at the cavoatrial junction, and the catheter is ready for use. Stat lock was placed. Patient tolerated the procedure well and remained hemodynamically stable throughout. No complications were encountered and no significant blood loss was encountered. IMPRESSION: Status post right brachial vein PICC.  Catheter ready for use. Signed, Dulcy Fanny. [name], DO [facility] Radiology Electronically Signed   By: Corrie Mckusick D.O.   On: 02/11/2017 13:10   Ir US Guide Vasc Access Right  Result Date: 02/11/2017 INDICATION: 46 year old female with a history of metastatic breast carcinoma. She presents for a port catheter placement to initiate chemotherapy. Upon presentation atrial fibrillation with RVR was discovered, which is her first presentation. No history of cardiac problems. Plan is for PICC placement with future port. EXAM: PICC LINE PLACEMENT WITH ULTRASOUND AND FLUOROSCOPIC GUIDANCE MEDICATIONS: None ANESTHESIA/SEDATION: None FLUOROSCOPY TIME:  Fluoroscopy Time: 0 minutes 6 seconds COMPLICATIONS: None PROCEDURE: Informed written consent was obtained from the patient after a thorough discussion of the procedural risks, benefits and alternatives. All questions were addressed. Maximal Sterile Barrier Technique was utilized including caps, mask, sterile gowns, sterile gloves, sterile drape, hand hygiene and skin antiseptic. A timeout was performed prior to the initiation of the procedure. Patient was position in the supine position on the fluoroscopy table with the right arm abducted 90 degrees. Ultrasound survey of the upper extremity was performed with images stored and sent to PACs. The right brachial vein was selected for access. Once the patient was prepped and draped in the usual sterile fashion, the skin and subcutaneous  tissues were generously infiltrated with 1%  lidocaine for local anesthesia. A micropuncture access kit was then used to access the targeted vein. Wire was passed centrally, confirmed to be within the venous system under fluoroscopy. A small stab incision was made with an 11 blade scalpel and the sheath was then placed over the wire. Estimated length of the catheter was then performed with the indwelling wire. Catheter was amputated at 41 cm length and placed with coaxial wire through the peel-away. Double-lumen, power injectable PICC in the brachial vein. Tip confirmed at the cavoatrial junction, and the catheter is ready for use. Stat lock was placed. Patient tolerated the procedure well and remained hemodynamically stable throughout. No complications were encountered and no significant blood loss was encountered. IMPRESSION: Status post right brachial vein PICC.  Catheter ready for use. Signed, Dulcy Fanny. [name], DO [facility] Radiology Electronically Signed   By: Corrie Mckusick D.O.   On: 02/11/2017 13:10    EKG  EKG Interpretation None       Radiology Ir Fluoro Guide Cv Line Right  Result Date: 02/11/2017 INDICATION: 46 year old female with a history of metastatic breast carcinoma. She presents for a port catheter placement to initiate chemotherapy. Upon presentation atrial fibrillation with RVR was discovered, which is her first presentation. No history of cardiac problems. Plan is for PICC placement with future port. EXAM: PICC LINE PLACEMENT WITH ULTRASOUND AND FLUOROSCOPIC GUIDANCE MEDICATIONS: None ANESTHESIA/SEDATION: None FLUOROSCOPY TIME:  Fluoroscopy Time: 0 minutes 6 seconds COMPLICATIONS: None PROCEDURE: Informed written consent was obtained from the patient after a thorough discussion of the procedural risks, benefits and alternatives. All questions were addressed. Maximal Sterile Barrier Technique was utilized including caps, mask, sterile gowns, sterile gloves, sterile drape, hand  hygiene and skin antiseptic. A timeout was performed prior to the initiation of the procedure. Patient was position in the supine position on the fluoroscopy table with the right arm abducted 90 degrees. Ultrasound survey of the upper extremity was performed with images stored and sent to PACs. The right brachial vein was selected for access. Once the patient was prepped and draped in the usual sterile fashion, the skin and subcutaneous tissues were generously infiltrated with 1% lidocaine for local anesthesia. A micropuncture access kit was then used to access the targeted vein. Wire was passed centrally, confirmed to be within the venous system under fluoroscopy. A small stab incision was made with an 11 blade scalpel and the sheath was then placed over the wire. Estimated length of the catheter was then performed with the indwelling wire. Catheter was amputated at 41 cm length and placed with coaxial wire through the peel-away. Double-lumen, power injectable PICC in the brachial vein. Tip confirmed at the cavoatrial junction, and the catheter is ready for use. Stat lock was placed. Patient tolerated the procedure well and remained hemodynamically stable throughout. No complications were encountered and no significant blood loss was encountered. IMPRESSION: Status post right brachial vein PICC.  Catheter ready for use. Signed, Dulcy Fanny. [name], DO [facility] Radiology Electronically Signed   By: Corrie Mckusick D.O.   On: 02/11/2017 13:10   Ir US Guide Vasc Access Right  Result Date: 02/11/2017 INDICATION: 46 year old female with a history of metastatic breast carcinoma. She presents for a port catheter placement to initiate chemotherapy. Upon presentation atrial fibrillation with RVR was discovered, which is her first presentation. No history of cardiac problems. Plan is for PICC placement with future port. EXAM:  PICC LINE PLACEMENT WITH ULTRASOUND AND  FLUOROSCOPIC GUIDANCE MEDICATIONS: None ANESTHESIA/SEDATION: None FLUOROSCOPY TIME:  Fluoroscopy Time: 0 minutes 6 seconds COMPLICATIONS: None PROCEDURE: Informed written consent was obtained from the patient after a thorough discussion of the procedural risks, benefits and alternatives. All questions were addressed. Maximal Sterile Barrier Technique was utilized including caps, mask, sterile gowns, sterile gloves, sterile drape, hand hygiene and skin antiseptic. A timeout was performed prior to the initiation of the procedure. Patient was position in the supine position on the fluoroscopy table with the right arm abducted 90 degrees. Ultrasound survey of the upper extremity was performed with images stored and sent to PACs. The right brachial vein was selected for access. Once the patient was prepped and draped in the usual sterile fashion, the skin and subcutaneous tissues were generously infiltrated with 1% lidocaine for local anesthesia. A micropuncture access kit was then used to access the targeted vein. Wire was passed centrally, confirmed to be within the venous system under fluoroscopy. A small stab incision was made with an 11 blade scalpel and the sheath was then placed over the wire. Estimated length of the catheter was then performed with the indwelling wire. Catheter was amputated at 41 cm length and placed with coaxial wire through the peel-away. Double-lumen, power injectable PICC in the brachial vein. Tip confirmed at the cavoatrial junction, and the catheter is ready for use. Stat lock was placed. Patient tolerated the procedure well and remained hemodynamically stable throughout. No complications were encountered and no significant blood loss was encountered. IMPRESSION: Status post right brachial vein PICC.  Catheter ready for use. Signed, Dulcy Fanny. [name], DO [facility] Radiology Electronically Signed   By: Corrie Mckusick D.O.   On: 02/11/2017  13:10    Procedures Procedures (including critical care time)  Medications Ordered in ED Medications  sodium chloride 0.9 % bolus 500 mL (not administered)  diltiazem (CARDIZEM) 1 mg/mL load via infusion 20 mg (not administered)    And  diltiazem (CARDIZEM) 100 mg in dextrose 5% 173m (1 mg/mL) infusion (not administered)     Initial Impression / Assessment and Plan / ED Course  I have reviewed the triage vital signs and the nursing notes.  Pertinent labs & imaging results that were available during my care of the patient were reviewed by me and considered in my medical decision making (see chart for details).  Iv ns. Continuous pulse ox and monitor.   Labs.  cardizem iv bolus and gtt.  Reviewed nursing notes and prior charts for additional history.   As unclear when onset, unable to emergently cardiovert in ED.    On cardizem gtt for rate control.  Will admit to med service.  They request cardiology consult.  Discussed w cardiology -  They will see - they indicate they will see first thing in AM, and that if issues sooner, admitting team can re-contact them.     Final Clinical Impressions(s) / ED Diagnoses   Final diagnoses:  None    New Prescriptions New Prescriptions   No medications on file         SLajean Saver MD 02/11/17 1517

## 2017-02-12 ENCOUNTER — Inpatient Hospital Stay (HOSPITAL_COMMUNITY): Payer: Federal, State, Local not specified - PPO

## 2017-02-12 DIAGNOSIS — I420 Dilated cardiomyopathy: Secondary | ICD-10-CM

## 2017-02-12 DIAGNOSIS — R079 Chest pain, unspecified: Secondary | ICD-10-CM

## 2017-02-12 DIAGNOSIS — I340 Nonrheumatic mitral (valve) insufficiency: Secondary | ICD-10-CM

## 2017-02-12 DIAGNOSIS — I4891 Unspecified atrial fibrillation: Secondary | ICD-10-CM | POA: Diagnosis not present

## 2017-02-12 DIAGNOSIS — R072 Precordial pain: Secondary | ICD-10-CM | POA: Diagnosis not present

## 2017-02-12 DIAGNOSIS — C50919 Malignant neoplasm of unspecified site of unspecified female breast: Secondary | ICD-10-CM

## 2017-02-12 LAB — TROPONIN I
Troponin I: <0.03 ng/mL (ref <0.03)
Troponin I: <0.03 ng/mL (ref <0.03)

## 2017-02-12 LAB — BASIC METABOLIC PANEL
Anion gap: 7 (ref 5–15)
BUN: 28 mg/dL — ABNORMAL HIGH (ref 6–20)
CHLORIDE: 103 mmol/L (ref 101–111)
CO2: 26 mmol/L (ref 22–32)
Calcium: 9.1 mg/dL (ref 8.9–10.3)
Creatinine, Ser: 0.67 mg/dL (ref 0.44–1.00)
GFR calc Af Amer: >60 mL/min (ref >60)
GFR calc non Af Amer: >60 mL/min (ref >60)
Glucose, Bld: 117 mg/dL — ABNORMAL HIGH (ref 65–99)
POTASSIUM: 4 mmol/L (ref 3.5–5.1)
Sodium: 136 mmol/L (ref 135–145)

## 2017-02-12 LAB — CBC
HEMATOCRIT: 30.2 % — AB (ref 36.0–46.0)
HEMOGLOBIN: 9.5 g/dL — AB (ref 12.0–15.0)
MCH: 27.6 pg (ref 26.0–34.0)
MCHC: 31.5 g/dL (ref 30.0–36.0)
MCV: 87.8 fL (ref 78.0–100.0)
Platelets: 363 10*3/uL (ref 150–400)
RBC: 3.44 MIL/uL — AB (ref 3.87–5.11)
RDW: 18.1 % — ABNORMAL HIGH (ref 11.5–15.5)
WBC: 5.1 10*3/uL (ref 4.0–10.5)

## 2017-02-12 LAB — ECHOCARDIOGRAM COMPLETE
HEIGHTINCHES: 67 in
Weight: 3305.14 oz

## 2017-02-12 LAB — HIV ANTIBODY (ROUTINE TESTING W REFLEX): HIV Screen 4th Generation wRfx: NONREACTIVE

## 2017-02-12 MED ORDER — TECHNETIUM TO 99M ALBUMIN AGGREGATED
4.3000 | Freq: Once | INTRAVENOUS | Status: AC | PRN
  Administered 2017-02-12: 4.3 via INTRAVENOUS

## 2017-02-12 MED ORDER — ENOXAPARIN SODIUM 40 MG/0.4ML ~~LOC~~ SOLN
40.0000 mg | SUBCUTANEOUS | Status: DC
Start: 2017-02-12 — End: 2017-02-12
  Administered 2017-02-12: 40 mg via SUBCUTANEOUS
  Filled 2017-02-12: qty 0.4

## 2017-02-12 MED ORDER — DILTIAZEM HCL-DEXTROSE 100-5 MG/100ML-% IV SOLN (PREMIX): 5.0000 mg/h | INTRAVENOUS | Status: DC

## 2017-02-12 MED ORDER — DILTIAZEM HCL ER COATED BEADS 120 MG PO CP24: 240.0000 mg | ORAL_CAPSULE | Freq: Every day | ORAL | Status: DC

## 2017-02-12 MED ORDER — AMIODARONE HCL 200 MG PO TABS
200.0000 mg | ORAL_TABLET | Freq: Two times a day (BID) | ORAL | Status: DC
  Administered 2017-02-12 – 2017-02-13 (×3): 200 mg via ORAL
  Filled 2017-02-12 (×3): qty 1

## 2017-02-12 MED ORDER — RIVAROXABAN 20 MG PO TABS
20.0000 mg | ORAL_TABLET | Freq: Every day | ORAL | Status: DC
  Administered 2017-02-12 – 2017-02-13 (×2): 20 mg via ORAL
  Filled 2017-02-12 (×3): qty 1

## 2017-02-12 MED ORDER — TECHNETIUM TC 99M DIETHYLENETRIAME-PENTAACETIC ACID: 32.4000 | Freq: Once | INTRAVENOUS | Status: DC | PRN

## 2017-02-12 MED ORDER — METOPROLOL TARTRATE 25 MG PO TABS
25.0000 mg | ORAL_TABLET | Freq: Three times a day (TID) | ORAL | Status: DC
  Administered 2017-02-12: 25 mg via ORAL
  Filled 2017-02-12: qty 1

## 2017-02-12 MED ORDER — AMIODARONE HCL 200 MG PO TABS: 400.0000 mg | ORAL_TABLET | Freq: Two times a day (BID) | ORAL | Status: DC

## 2017-02-12 NOTE — Progress Notes (Signed)
Patient taken to xray and brought back. Daughter was in room. Transporter that brought patient back to room stopped wheelchair at foot of bed and had her to get up from wheel chair and walk to bed by herself.The transporter pushed the IV pole to the other side of the bed and walked out. The daughter said the transporter should have walker her mom to the bed because she is a fall risk and if she would have fell it would have been a law suit when she came in for something totally different. The patient said the transporter didn't cover her up or ask if she wanted to be covered. She didn't feel cared for. I have spoken with Joy, supervisor in radiology, and relayed this message.

## 2017-02-12 NOTE — Progress Notes (Signed)
  Echocardiogram 2D Echocardiogram has been performed.  Cindy Bradshaw 02/12/2017, 9:00 AM

## 2017-02-12 NOTE — Care Management Note (Signed)
Case Management Note  Patient Details  Name: Cindy Bradshaw MRN: [id number] Date of Birth: 09-20-1970  Subjective/Objective:                  46 y.o. female with medical history significant for upper left breast cancer, initial diagnosis in December 2016, status post neoadjuvant chemotherapy from January 2017 through May 2017, left lumpectomy 02/2016, RT to left breast 03/2016 - 05/2016, recurrence and mets to lungs 11/2016, started xeloda 12/20/2016, under the care of Dr. Lindi Adie. Pt was in radiation office to get PICC line placed and has developed atrial fibrillation. Patient reports very mild chest pain in the midsternal area, intermittent, about 2 out of 10 in intensity, nonradiating. Pain is present at rest. No specific alleviating or aggravating factors. No palpitations or shortness of breath. No other complaints such as abdominal pain, nausea or vomiting. No lightheadedness or dizziness or loss of consciousness. No urinary complaints.  ED Course: Patient was hemodynamically stable other than tachycardia 132. Blood work was relatively unremarkable except for hemoglobin of 10.1. The 12-lead EKG was significant for atrial fibrillation and patient was started on Cardizem drip which lowered the heart rate down to 120s. ED physician has consulted cardiology.  Action/Plan: Date:  February 12, 2017 Chart reviewed for concurrent status and case management needs. Will continue to follow patient progress. Discharge Planning: following for needs Expected discharge date: [id number] Velva Harman, BSN, [place], [place]  Expected Discharge Date:   (unknown)               Expected Discharge Plan:  Home/Self Care  In-House Referral:     Discharge planning Services  CM Consult  Post Acute Care Choice:    Choice offered to:     DME Arranged:    DME Agency:     HH Arranged:    [place] Agency:     Status of Service:  In process, will continue to follow  If discussed at Long Length of Stay Meetings,  dates discussed:    Additional Comments:  [name], RN 02/12/2017, 8:42 AM

## 2017-02-12 NOTE — Progress Notes (Signed)
PROGRESS NOTE  Cindy Bradshaw FTD:[id number] DOB: April 17, 1971 DOA: 02/11/2017 PCP: [name], MD  Brief History:  46 year old female with medical history significant for upper left breast cancer, initial diagnosis in December 2016, status post neoadjuvant chemotherapy from January 2017 through May 2017, left lumpectomy 02/2016, RT to left breast 03/2016 - 05/2016, recurrence and mets to lungs 11/2016, started xeloda 12/20/2016, under the care of Dr. Lindi Adie.  She was in the radiology getting a Port-A-Cath when examination revealed the patient had an irregular heartbeat. EKG revealed atrial fibrillation with RVR. A PICC line was subsequently placed in lieu of the Port-A-Cath. The patient was sent to emergency department for further evaluation.  The patient has been complaining of some dyspnea on exertion and intermittent chest discomfort for the past month. The patient described her chest discomfort is substernal and pressure like sensation without any radiation or diaphoresis, nausea, vomiting. She has had intermittent dizziness without any syncope. She denies any palpitations, fevers, chills, vomiting, diarrhea, hematochezia, melena. Notably, the patient had heart catheterization on 01/24/2016 which revealed normal coronaries and normal EF. With regard to her breast cancer, the patient was initially diagnosed on 10/17/2014. The patient was found to have recurrence with lung metastasis. Biopsy of a lung nodule on 11/20/2016 and from the relapse. 12/04/2016 PET scan revealed hypermetabolism in the lungs. The patient was started on Xeloda on 12/14/2016.  Assessment/Plan: Atrial fibrillation with RVR  -09/13/2015 echo EF 55-60%, no WMA - Repeat echocardiogram -Given history of recurrent cancer, and dyspnea on exertion, obtain V/Q scan (pt has dye allergy) -if indeterminant-->CTA chest -01/24/2016 heart catheterization normal coronaries  -TSH 0.453  -Personally reviewed EKG --atrial  fibrillation, nonspecific ST changes  -consult cardiology -continue diltiazem drip with transition to po in next 24 hours if stable -CHADS-VASc = 1 (female)  Elevated troponin  -Demand ischemia in the setting of atrial fibrillation with RVR  -Trend is flat   Atypical chest pain  -01/24/2016 heart catheterization normal coronaries  -V/Q scan  Metastatic breast cancer to lung  -placed on Dr. Geralyn Flash consult list -continue xeloda  Chemotherapy related anemia -baseline Hgb ~10   Disposition Plan:   Not stable for d/c  Family Communication:  No Family at bedside  Consultants:  cardiology  Code Status:  FULL   DVT Prophylaxis:  [place] Lovenox   Procedures: As Listed in Progress Note Above  Antibiotics: None    Subjective: Patient denies fevers, chills, headache, chest pain, dyspnea, nausea, vomiting, diarrhea, abdominal pain, dysuria, hematuria, hematochezia, and melena.   Objective: Vitals:   02/12/17 0500 02/12/17 0529 02/12/17 0600 02/12/17 0700  BP:   126/76   Pulse: 87  84 85  Resp: (!) 23  (!) 23 19  Temp:  98.8 F (37.1 C)    TempSrc:  Oral    SpO2: 97%  97% 97%  Weight:      Height:        Intake/Output Summary (Last 24 hours) at 02/12/17 0623 Last data filed at 02/12/17 0600  Gross per 24 hour  Intake          1528.66 ml  Output             1400 ml  Net           128.66 ml   Weight change:  Exam:   General:  Pt is alert, follows commands appropriately, not in acute distress  HEENT: No icterus, No thrush, No neck  mass, [place]/AT  Cardiovascular: IRRR, S1/S2, no rubs, no gallops  Respiratory: CTA bilaterally, no wheezing, no crackles, no rhonchi  Abdomen: Soft/+BS, non tender, non distended, no guarding  Extremities: No edema, No lymphangitis, No petechiae, No rashes, no synovitis   Data Reviewed: I have personally reviewed following labs and imaging studies Basic Metabolic Panel:  Recent Labs Lab 02/11/17 1339 02/12/17 0350  NA 133*  136  K 4.3 4.0  CL 100* 103  CO2 27 26  GLUCOSE 99 117*  BUN 25* 28*  CREATININE 0.62 0.67  CALCIUM 10.3 9.1   Liver Function Tests: No results for input(s): AST, ALT, ALKPHOS, BILITOT, PROT, ALBUMIN in the last 168 hours. No results for input(s): LIPASE, AMYLASE in the last 168 hours. No results for input(s): AMMONIA in the last 168 hours. Coagulation Profile:  Recent Labs Lab 02/11/17 1015  INR 1.13   CBC:  Recent Labs Lab 02/11/17 1015 02/11/17 1339 02/12/17 0350  WBC 7.2 6.4 5.1  NEUTROABS  --  4.3  --   HGB 10.1* 10.1* 9.5*  HCT 31.6* 31.7* 30.2*  MCV 86.3 86.1 87.8  PLT 455* 409* 363   Cardiac Enzymes:  Recent Labs Lab 02/11/17 1925 02/12/17 0313  TROPONINI 0.03* <0.03   BNP: Invalid input(s): POCBNP CBG: No results for input(s): GLUCAP in the last 168 hours. HbA1C: No results for input(s): HGBA1C in the last 72 hours. Urine analysis:    Component Value Date/Time   COLORURINE YELLOW 07/03/2016 0557   APPEARANCEUR CLOUDY (A) 07/03/2016 0557   LABSPEC 1.021 07/03/2016 0557   LABSPEC 1.010 04/10/2016 1224   PHURINE 6.0 07/03/2016 0557   GLUCOSEU NEGATIVE 07/03/2016 0557   GLUCOSEU Negative 04/10/2016 1224   HGBUR SMALL (A) 07/03/2016 0557   BILIRUBINUR NEGATIVE 07/03/2016 0557   BILIRUBINUR Negative 04/10/2016 [place] 07/03/2016 0557   PROTEINUR NEGATIVE 07/03/2016 0557   UROBILINOGEN 0.2 04/10/2016 1224   NITRITE NEGATIVE 07/03/2016 0557   LEUKOCYTESUR NEGATIVE 07/03/2016 0557   LEUKOCYTESUR Negative 04/10/2016 1224   Sepsis Labs: @LABRCNTIP (procalcitonin:4,lacticidven:4) ) Recent Results (from the past 240 hour(s))  MRSA PCR Screening     Status: None   Collection Time: 02/11/17  4:17 PM  Result Value Ref Range Status   MRSA by PCR NEGATIVE NEGATIVE Final    Comment:        The GeneXpert MRSA Assay (FDA approved for NASAL specimens only), is one component of a comprehensive MRSA colonization surveillance program.  It is not intended to diagnose MRSA infection nor to guide or monitor treatment for MRSA infections.      Scheduled Meds: . aspirin EC  81 mg Oral Daily  . capecitabine  1,500 mg Oral BID PC  . Chlorhexidine Gluconate Cloth  6 each Topical Daily  . dexamethasone  4 mg Oral Daily  . fluconazole  200 mg Oral Daily   Continuous Infusions: . diltiazem (CARDIZEM) infusion 10 mg/hr (02/12/17 0600)    Procedures/Studies: Dg Chest 2 View  Result Date: 01/24/2017 CLINICAL DATA:  46 year old female with metastatic breast cancer. Shortness of breath for 4 days. EXAM: CHEST  2 VIEW COMPARISON:  PET-CT 12/04/2016, chest radiographs 11/22/2016, and earlier FINDINGS: Multiple rounded nodular bilateral mid and lower lung pulmonary metastases appear larger and denser than on 11/22/2016. Associated worsening lung base ventilation Stable lung volumes. Mild interval hilar enlargement. Other mediastinal contours remain stable. Visualized tracheal air column is within normal limits. No pneumothorax or layering pleural effusion. There does appear to be trace pleural  fluid in the fissures which has not definitely changed. No definite consolidation. Stable visualized osseous structures. Stable cholecystectomy clips. Negative visible bowel gas pattern. Stable surgical clips at the left axilla and chest wall. IMPRESSION: 1. Radiographically progressed bilateral mid and lower lung metastases since March with associated worsening lung base ventilation. 2. No drainable pleural fluid or other acute cardiopulmonary abnormality identified. Electronically Signed   By: Genevie Ann M.D.   On: 01/24/2017 14:25   Ct Chest W Contrast  Result Date: 01/30/2017 CLINICAL DATA:  Metastatic left breast cancer EXAM: CT CHEST, ABDOMEN, AND PELVIS WITH CONTRAST TECHNIQUE: Multidetector CT imaging of the chest, abdomen and pelvis was performed following the standard protocol during bolus administration of intravenous contrast. CONTRAST:   116m ISOVUE-300 IOPAMIDOL (ISOVUE-300) INJECTION 61% COMPARISON:  12/04/2016 FINDINGS: CT CHEST FINDINGS Cardiovascular: No acute aortic findings. Extrinsic mass effect on portions of the pulmonary arterial tree due to extensive hilar and mediastinal adenopathy. Mediastinum/Nodes: Extensive than worsened prevascular, AP window, paratracheal, hilar, infrahilar, and internal mammary adenopathy. Conglomerate AP window adenopathy measures 3.6 cm in short axis, previously 2.8 cm. A subcarinal node measures 2.6 cm in short axis, previously 2.2 cm. The size comparisons are made to the prior PET-CT from 12/04/2016. Prior left axillary dissection. Lungs/Pleura: Compared to the prior PET-CT, there is increase in size and number of scattered pulmonary nodules and masses. For example, a pleural-based mass along the lingula measures 1.9 by 2.2 cm, previously 1.5 by 1.0 cm. A masslike density along the right hemidiaphragm measures 8.0 by 5.5 cm on image 93/4, previously 3.0 by 2.3 cm. One example of a new nodule is the 5 mm pleural-based nodule on image 42 series 4 along the right upper lobe. There many an additional new pulmonary nodules. Trace bilateral pleural effusions with pleural masses favoring malignant effusions. Musculoskeletal: New and enlarging nodules and masses along the right breast. A right inferior breast mass measures 4.3 by 3.8 cm on image 42/2 and was previously 1.4 cm in diameter. A medial mass in the right breast measures 2.8 by 1.7 cm on image 32/2 and was previously 0.8 cm in diameter. A new nodule centrally in the right breast measures 1.0 cm in diameter on image 21/2. Other breast and subcutaneous nodules are present on the right. Thoracic spondylosis. CT ABDOMEN PELVIS FINDINGS Hepatobiliary: Mild intrahepatic biliary dilatation. Common bile duct measures 1.2 cm in diameter. No focal hepatic lesion identified. Pancreas: Unremarkable Spleen: Unremarkable Adrenals/Urinary Tract: Unremarkable  Stomach/Bowel: Prominent stool throughout the colon favors constipation. Vascular/Lymphatic: Unremarkable Reproductive: Unremarkable Other: Scattered new subcutaneous nodules along the abdomen are worrisome for subcutaneous deposits of tumor. An index lesion measures 1.5 by 0.9 cm on image 73 of series 2. These are new compared to the prior exam. Musculoskeletal: Lumbar spondylosis and degenerative disc disease with right foraminal impingement at L5-S1 due to spurring, image 93/6. Transitional S1 vertebra. IMPRESSION: 1. Worsened tumor burden with significant abnormal nodal enlargement in the chest; new and enlarged pulmonary nodules and masses; new and enlarged right breast and subcutaneous masses and nodules, compatible with metastatic disease. 2. Trace bilateral pleural effusions may be malignant given the visible pleural nodules. 3.  Prominent stool throughout the colon favors constipation. 4. Mild biliary dilatation, some of which may be a physiologic response to cholecystectomy. 5. Right foraminal impingement at L5-S1 due to spurring. Electronically Signed   By: WVan ClinesM.D.   On: 01/30/2017 15:18   Ct Abdomen Pelvis W Contrast  Result Date: 01/30/2017 CLINICAL DATA:  Metastatic left breast cancer EXAM: CT CHEST, ABDOMEN, AND PELVIS WITH CONTRAST TECHNIQUE: Multidetector CT imaging of the chest, abdomen and pelvis was performed following the standard protocol during bolus administration of intravenous contrast. CONTRAST:  124m ISOVUE-300 IOPAMIDOL (ISOVUE-300) INJECTION 61% COMPARISON:  12/04/2016 FINDINGS: CT CHEST FINDINGS Cardiovascular: No acute aortic findings. Extrinsic mass effect on portions of the pulmonary arterial tree due to extensive hilar and mediastinal adenopathy. Mediastinum/Nodes: Extensive than worsened prevascular, AP window, paratracheal, hilar, infrahilar, and internal mammary adenopathy. Conglomerate AP window adenopathy measures 3.6 cm in short axis, previously 2.8 cm. A  subcarinal node measures 2.6 cm in short axis, previously 2.2 cm. The size comparisons are made to the prior PET-CT from 12/04/2016. Prior left axillary dissection. Lungs/Pleura: Compared to the prior PET-CT, there is increase in size and number of scattered pulmonary nodules and masses. For example, a pleural-based mass along the lingula measures 1.9 by 2.2 cm, previously 1.5 by 1.0 cm. A masslike density along the right hemidiaphragm measures 8.0 by 5.5 cm on image 93/4, previously 3.0 by 2.3 cm. One example of a new nodule is the 5 mm pleural-based nodule on image 42 series 4 along the right upper lobe. There many an additional new pulmonary nodules. Trace bilateral pleural effusions with pleural masses favoring malignant effusions. Musculoskeletal: New and enlarging nodules and masses along the right breast. A right inferior breast mass measures 4.3 by 3.8 cm on image 42/2 and was previously 1.4 cm in diameter. A medial mass in the right breast measures 2.8 by 1.7 cm on image 32/2 and was previously 0.8 cm in diameter. A new nodule centrally in the right breast measures 1.0 cm in diameter on image 21/2. Other breast and subcutaneous nodules are present on the right. Thoracic spondylosis. CT ABDOMEN PELVIS FINDINGS Hepatobiliary: Mild intrahepatic biliary dilatation. Common bile duct measures 1.2 cm in diameter. No focal hepatic lesion identified. Pancreas: Unremarkable Spleen: Unremarkable Adrenals/Urinary Tract: Unremarkable Stomach/Bowel: Prominent stool throughout the colon favors constipation. Vascular/Lymphatic: Unremarkable Reproductive: Unremarkable Other: Scattered new subcutaneous nodules along the abdomen are worrisome for subcutaneous deposits of tumor. An index lesion measures 1.5 by 0.9 cm on image 73 of series 2. These are new compared to the prior exam. Musculoskeletal: Lumbar spondylosis and degenerative disc disease with right foraminal impingement at L5-S1 due to spurring, image 93/6.  Transitional S1 vertebra. IMPRESSION: 1. Worsened tumor burden with significant abnormal nodal enlargement in the chest; new and enlarged pulmonary nodules and masses; new and enlarged right breast and subcutaneous masses and nodules, compatible with metastatic disease. 2. Trace bilateral pleural effusions may be malignant given the visible pleural nodules. 3.  Prominent stool throughout the colon favors constipation. 4. Mild biliary dilatation, some of which may be a physiologic response to cholecystectomy. 5. Right foraminal impingement at L5-S1 due to spurring. Electronically Signed   By: WVan ClinesM.D.   On: 01/30/2017 15:18   Ir Fluoro Guide Cv Line Right  Result Date: 02/11/2017 INDICATION: 46year old female with a history of metastatic breast carcinoma. She presents for a port catheter placement to initiate chemotherapy. Upon presentation atrial fibrillation with RVR was discovered, which is her first presentation. No history of cardiac problems. Plan is for PICC placement with future port. EXAM: PICC LINE PLACEMENT WITH ULTRASOUND AND FLUOROSCOPIC GUIDANCE MEDICATIONS: None ANESTHESIA/SEDATION: None FLUOROSCOPY TIME:  Fluoroscopy Time: 0 minutes 6 seconds COMPLICATIONS: None PROCEDURE: Informed written consent was obtained from the patient after a thorough discussion of the procedural risks, benefits and alternatives. All questions  were addressed. Maximal Sterile Barrier Technique was utilized including caps, mask, sterile gowns, sterile gloves, sterile drape, hand hygiene and skin antiseptic. A timeout was performed prior to the initiation of the procedure. Patient was position in the supine position on the fluoroscopy table with the right arm abducted 90 degrees. Ultrasound survey of the upper extremity was performed with images stored and sent to PACs. The right brachial vein was selected for access. Once the patient was prepped and draped in the usual sterile fashion, the skin and  subcutaneous tissues were generously infiltrated with 1% lidocaine for local anesthesia. A micropuncture access kit was then used to access the targeted vein. Wire was passed centrally, confirmed to be within the venous system under fluoroscopy. A small stab incision was made with an 11 blade scalpel and the sheath was then placed over the wire. Estimated length of the catheter was then performed with the indwelling wire. Catheter was amputated at 41 cm length and placed with coaxial wire through the peel-away. Double-lumen, power injectable PICC in the brachial vein. Tip confirmed at the cavoatrial junction, and the catheter is ready for use. Stat lock was placed. Patient tolerated the procedure well and remained hemodynamically stable throughout. No complications were encountered and no significant blood loss was encountered. IMPRESSION: Status post right brachial vein PICC.  Catheter ready for use. Signed, Dulcy Fanny. [name], DO [facility] Radiology Electronically Signed   By: Corrie Mckusick D.O.   On: 02/11/2017 13:10   Ir US Guide Vasc Access Right  Result Date: 02/11/2017 INDICATION: 46 year old female with a history of metastatic breast carcinoma. She presents for a port catheter placement to initiate chemotherapy. Upon presentation atrial fibrillation with RVR was discovered, which is her first presentation. No history of cardiac problems. Plan is for PICC placement with future port. EXAM: PICC LINE PLACEMENT WITH ULTRASOUND AND FLUOROSCOPIC GUIDANCE MEDICATIONS: None ANESTHESIA/SEDATION: None FLUOROSCOPY TIME:  Fluoroscopy Time: 0 minutes 6 seconds COMPLICATIONS: None PROCEDURE: Informed written consent was obtained from the patient after a thorough discussion of the procedural risks, benefits and alternatives. All questions were addressed. Maximal Sterile Barrier Technique was utilized including caps, mask, sterile gowns, sterile gloves, sterile drape, hand  hygiene and skin antiseptic. A timeout was performed prior to the initiation of the procedure. Patient was position in the supine position on the fluoroscopy table with the right arm abducted 90 degrees. Ultrasound survey of the upper extremity was performed with images stored and sent to PACs. The right brachial vein was selected for access. Once the patient was prepped and draped in the usual sterile fashion, the skin and subcutaneous tissues were generously infiltrated with 1% lidocaine for local anesthesia. A micropuncture access kit was then used to access the targeted vein. Wire was passed centrally, confirmed to be within the venous system under fluoroscopy. A small stab incision was made with an 11 blade scalpel and the sheath was then placed over the wire. Estimated length of the catheter was then performed with the indwelling wire. Catheter was amputated at 41 cm length and placed with coaxial wire through the peel-away. Double-lumen, power injectable PICC in the brachial vein. Tip confirmed at the cavoatrial junction, and the catheter is ready for use. Stat lock was placed. Patient tolerated the procedure well and remained hemodynamically stable throughout. No complications were encountered and no significant blood loss was encountered. IMPRESSION: Status post right brachial vein PICC.  Catheter ready for use. Signed, Dulcy Fanny. [name], DO [facility] Radiology Electronically Signed   By: Corrie Mckusick D.O.   On: 02/11/2017 13:10    Herley Bernardini, DO  [facility] Pager [phone]  If 7PM-7AM, please contact night-coverage [email or web address] Password [email or web address] 02/12/2017, 8:22 AM   LOS: 1 day

## 2017-02-12 NOTE — Progress Notes (Signed)
ANTICOAGULATION CONSULT NOTE - Initial Consult  Pharmacy Consult for Xarelto Indication: atrial fibrillation  Allergies  Allergen Reactions  . Dilaudid [Hydromorphone Hcl] Itching  . Ivp Dye [Iodinated Diagnostic Agents] Itching  . Percocet [Oxycodone-Acetaminophen] Itching  . Tape Itching and Rash  . Toradol [Ketorolac Tromethamine] Itching    Patient Measurements: Height: 5\' 7"  (170.2 cm) Weight: 206 lb 9.1 oz (93.7 kg) IBW/kg (Calculated) : 61.6  Vital Signs: Temp: 97.6 F (36.4 C) (06/12 1200) Temp Source: Axillary (06/12 1200) BP: 143/70 (06/12 1407) Pulse Rate: 104 (06/12 1407)  Labs:  Recent Labs  02/11/17 1015 02/11/17 1339 02/11/17 1925 02/12/17 0313 02/12/17 0350 02/12/17 1353  HGB 10.1* 10.1*  --   --  9.5*  --   HCT 31.6* 31.7*  --   --  30.2*  --   PLT 455* 409*  --   --  363  --   APTT 24  --   --   --   --   --   LABPROT 14.5  --   --   --   --   --   INR 1.13  --   --   --   --   --   CREATININE  --  0.62  --   --  0.67  --   TROPONINI  --   --  0.03* <0.03  --  <0.03    Estimated Creatinine Clearance: 102.1 mL/min (by C-G formula based on SCr of 0.67 mg/dL).   Medical History: Past Medical History:  Diagnosis Date  . Arthritis   . Asthma   . Back disorder    degenerative disk disease  . Breast cancer ([place])   . Cancer ([place])   . GERD (gastroesophageal reflux disease)   . Mammogram abnormal 08/24/15   first  . Migraine   . Pap smear for cervical cancer screening 08/12/15    Medications:  Scheduled:  . amiodarone  200 mg Oral BID  . capecitabine  1,500 mg Oral BID PC  . Chlorhexidine Gluconate Cloth  6 each Topical Daily  . dexamethasone  4 mg Oral Daily   Infusions:    Assessment: 51 yoF admitted on 6/11 after she developed atrial fibrillation while in radiation office getting PAC placed.  A PICC line was placed instead of PAC.  PMH includes metastatic breast cancer and chemotherapy related anemia, but no prior anticoagulation.   Pharmacy is consulted to dose Xarelto.  Prophylactic Lovenox 40mg  SQ daily - last dose on 6/12 1000.  Today, 02/12/2017: CBC: Hgb 9.5 (baseline 10-12), and Plt WNL No bleeding or complications noted. SCr 0.67 with CrCl ~ 100 ml/min   Goal of Therapy:  Monitor platelets by anticoagulation protocol: Yes   Plan:   Xarelto 20 mg PO once daily with supper.   Follow up renal function and CBC  Monitor for s/s bleeding, thrombosis.  Pharmacy to provide education prior to discharge.  Gretta Arab PharmD, BCPS Pager [phone] 02/12/2017 2:59 PM

## 2017-02-12 NOTE — Consult Note (Signed)
Cardiology Consult    Patient ID: Cindy Bradshaw MRN: [id number], DOB/AGE: [birth year]   Admit date: 02/11/2017 Date of Consult: 02/12/2017  Primary Physician: [name], MD Primary Cardiologist: Dr. Johnsie Cancel Requesting Provider: Dr. Carles Collet  Reason for Consult: atrial fibrillation  Patient Profile    Ms. Cindy Bradshaw has a PMH significant for breast cancer with lung mets, asthma, and GERD. She presented to [facility] from interventional radiology department with atrial fibrillation RVR.  Cindy Bradshaw is a 46 y.o. female who is being seen today for the evaluation of atrial fibrillation at the request of Dr. Carles Collet.   Past Medical History   Past Medical History:  Diagnosis Date  . Arthritis   . Asthma   . Back disorder    degenerative disk disease  . Breast cancer ([place])   . Cancer ([place])   . GERD (gastroesophageal reflux disease)   . Mammogram abnormal 08/24/15   first  . Migraine   . Pap smear for cervical cancer screening 08/12/15    Past Surgical History:  Procedure Laterality Date  . BREAST LUMPECTOMY WITH RADIOACTIVE SEED AND SENTINEL LYMPH NODE BIOPSY Left 02/13/2016   Procedure: BREAST LUMPECTOMY WITH RADIOACTIVE SEED AND SENTINEL LYMPH NODE BIOPSY;  Surgeon: Excell Seltzer, MD;  Location: [place];  Service: General;  Laterality: Left;  . BREAST REDUCTION SURGERY Bilateral 02/21/2016   Procedure: MAMMARY REDUCTION  (BREAST)/Oncoplastic breast reconstruction;  Surgeon: Irene Limbo, MD;  Location: [place];  Service: Plastics;  Laterality: Bilateral;  . CARDIAC CATHETERIZATION [name] 01/24/2016   Procedure: Left Heart Cath and Coronary Angiography;  Surgeon: Leonie Man, MD;  Location: [place] [facility];  Service: Cardiovascular;  Laterality: [name];  . CESAREAN SECTION    . CHOLECYSTECTOMY    . IR FLUORO GUIDE CV LINE RIGHT  02/11/2017  . IR US GUIDE VASC ACCESS RIGHT  02/11/2017  . PERIPHERAL VASCULAR CATHETERIZATION Right 02/13/2016   Procedure: PORTA CATH REMOVAL;  Surgeon: Excell Seltzer, MD;  Location: [place];  Service: General;  Laterality: Right;  . PORTACATH PLACEMENT [name] 09/12/2015   Procedure: INSERTION PORT-A-CATH;  Surgeon: Excell Seltzer, MD;  Location: [facility];  Service: General;  Laterality: [name];     Allergies  Allergies  Allergen Reactions  . Dilaudid [Hydromorphone Hcl] Itching  . Ivp Dye [Iodinated Diagnostic Agents] Itching  . Percocet [Oxycodone-Acetaminophen] Itching  . Tape Itching and Rash  . Toradol [Ketorolac Tromethamine] Itching    History of Present Illness    Ms Cindy Bradshaw has a history of upper left breast cancer (2016) with metastatic disease to her lungs (11/2016) and is s/p lumpectomy (02/2016) and chemoradiation (chemo 09/2015-01/2016; radiation 03/2016-05/2016) treatment. She has recent evidence of disease progression (lung mets) and will undergo additional palliative chemotherapy. She presented on 02/11/17 to IR for a port-a-cath insertion. Ectopy was noted on exam in IR and EKG revealed Afib with RVR. Port-a-cath insertion was aborted; she received a right PICC and was sent to [facility] for further evaluation.   On my interview, she is tolerating the rhythm and rate well, denies palpitations. She states she had chest pain 2 days ago, but attributed it to her previous left-sided breast cancer vs indigestion. The chest pain waxed and waned, was not associated with any other symptoms except SOB, and there are no relieving or aggravating factors. She describes the chest pain in her left upper chest as a tightness. She denies chest pain while doing house chores, but has been short of  breath for several weeks. She states she has had a cough since January. It is unclear if this is related to her metastatic disease (lung mets) or another subacute process. VQ scan negative for PE.  Telemetry review reveals Afib RVR with rates in the 150s. She now appears to be in NSR with frequent PACs and  frequent bouts of Afib. She seems to spontaneously convert out of Afib back to NSR.  Diltiazem drip is running at 7.5 mg/hr.   Inpatient Medications    . aspirin EC  81 mg Oral Daily  . capecitabine  1,500 mg Oral BID PC  . Chlorhexidine Gluconate Cloth  6 each Topical Daily  . dexamethasone  4 mg Oral Daily  . fluconazole  200 mg Oral Daily     Outpatient Medications    Prior to Admission medications   Medication Sig Start Date End Date Taking? Authorizing Provider  albuterol (PROVENTIL HFA;VENTOLIN HFA) 108 (90 Base) MCG/ACT inhaler Inhale 1-2 puffs into the lungs every 6 (six) hours as needed for wheezing or shortness of breath. 07/24/16  Yes Waynetta Pean, PA-C  albuterol (PROVENTIL) (2.5 MG/3ML) 0.083% nebulizer solution Take 2.5 mg by nebulization every 6 (six) hours as needed for wheezing or shortness of breath.   Yes [provider]  capecitabine (XELODA) 500 MG tablet Take 3 tablets (1,500 mg total) by mouth 2 (two) times daily after a meal. 14 days on 7 days off 11/27/16  Yes Nicholas Lose, MD  dexamethasone (DECADRON) 4 MG tablet Take 1 tablet (4 mg total) by mouth daily. 02/05/17  Yes Causey, Charlestine Massed, NP  fluconazole (DIFLUCAN) 200 MG tablet Take 1 tablet (200 mg total) by mouth daily. 02/05/17  Yes Causey, Charlestine Massed, NP  Melatonin 10 MG TABS Take 10 mg by mouth at bedtime.    Yes [provider]  fexofenadine (ALLEGRA) 180 MG tablet Take 180 mg by mouth 2 (two) times daily.    [provider]  HYDROcodone-acetaminophen (NORCO/VICODIN) 5-325 MG tablet Take 1 tablet by mouth every 4 (four) hours as needed. Patient not taking: Reported on 02/05/2017 09/20/16   Isla Pence, MD  HYDROcodone-homatropine [facility]) 5-1.5 MG/5ML syrup Take 5 mLs by mouth every 6 (six) hours as needed for cough. Patient not taking: Reported on 02/11/2017 01/24/17   Nicholas Lose, MD  hydrOXYzine (VISTARIL) 25 MG capsule Take 1 capsule (25 mg total) by mouth at  bedtime. Patient not taking: Reported on 02/11/2017 07/13/16   Kennith Gain, MD  lidocaine-prilocaine (EMLA) cream Apply to affected area once Patient not taking: Reported on 02/11/2017 02/05/17   Nicholas Lose, MD  ondansetron (ZOFRAN) 8 MG tablet Take 1 tablet (8 mg total) by mouth 2 (two) times daily as needed (Nausea or vomiting). Patient not taking: Reported on 02/11/2017 02/05/17   Nicholas Lose, MD  oxyCODONE-acetaminophen (PERCOCET) 5-325 MG tablet Take 2 tablets by mouth every 4 (four) hours as needed. Patient not taking: Reported on 02/05/2017 11/13/16   Charlesetta Shanks, MD  predniSONE (DELTASONE) 50 MG tablet Take 1 34m tablet 13 hrs prior to CT, 7hrs prior to CT, and 1 hr prior to CT. Patient not taking: Reported on 02/05/2017 01/24/17   GNicholas Lose MD  prochlorperazine (COMPAZINE) 10 MG tablet Take 1 tablet (10 mg total) by mouth every 6 (six) hours as needed (Nausea or vomiting). Patient not taking: Reported on 02/11/2017 02/05/17   GNicholas Lose MD     Family History    Family History  Problem Relation Age of  Onset  . Lung cancer Mother 71       2 different types of lung cancer; metastasis to brain; smoker  . Other Mother        hx of hysterectomy for unspecified reason  . Bone cancer Maternal Uncle        dx. early 22s  . Breast cancer Maternal Grandmother        dx. early 67s, s/p mastectomy  . Cancer Paternal Grandfather        unspecified type of cancer, dx. late 86s  . Congestive Heart Failure Father        smoker  . Stroke Father   . Eczema Father   . Cirrhosis Maternal Grandfather   . Heart Problems Maternal Grandfather   . Asthma Daughter   . Allergies Daughter        hives  . Lung cancer Other        (maternal great uncle; MGM's brother); had a coal stove  . Cancer Cousin        unspecified type; d. early age (paternal first cousin once-removed)  . Cancer Cousin        dx. as a kid; in remission today; (paternal 2nd cousin)  . Cancer Cousin         unspecified type; d. early 42s; (maternal 1st cousin)    Social History    Social History   Social History  . Marital status: Legally Separated    Spouse name: Chrissie Noa  . Number of children: 2  . Years of education: [name]   Occupational History  . Web designer    Social History Main Topics  . Smoking status: Never Smoker  . Smokeless tobacco: Never Used  . Alcohol use 0.0 oz/week     Comment: occassional glass of wine  . Drug use: No  . Sexual activity: Yes   Other Topics Concern  . Not on file   Social History Narrative   ** Merged History Encounter **       First MP age 62.  LMP 08/26/15.     2 children carried to term.  Age at first live birth 16   H/o oral contrasception use        Review of Systems    General:  No chills, fever, night sweats or weight changes.  Cardiovascular:  + chest pain, + dyspnea on exertion, no edema, orthopnea, palpitations, paroxysmal nocturnal dyspnea. Dermatological: No rash, lesions/masses Respiratory: No cough, + dyspnea Urologic: No hematuria, dysuria Abdominal:   No nausea, vomiting, diarrhea, bright red blood per rectum, melena, or hematemesis Neurologic:  No visual changes, changes in mental status. All other systems reviewed and are otherwise negative except as noted above.  Physical Exam    Blood pressure 126/76, pulse 85, temperature 98.8 F (37.1 C), temperature source Oral, resp. rate 19, height 5' 7"  (1.702 m), weight 206 lb 9.1 oz (93.7 kg), SpO2 97 %.  General: Pleasant, NAD Psych: Normal affect. Neuro: Alert and oriented X 3. Moves all extremities spontaneously. HEENT: Normal  Neck: Supple without bruits or JVD. Lungs:  Resp regular and unlabored, CTA. Heart: Irregular rate and rhythm, no murmurs. Abdomen: Soft, non-tender, non-distended, BS + x 4.  Extremities: No clubbing, cyanosis or edema. DP/PT/Radials 2+ and equal bilaterally.  Labs    Troponin [facility] of Care Test)  Recent Labs   02/11/17 1352  TROPIPOC 0.02    Recent Labs  02/11/17 1925 02/12/17 0313  TROPONINI 0.03* <0.03   Lab Results  Component Value Date   WBC 5.1 02/12/2017   HGB 9.5 (L) 02/12/2017   HCT 30.2 (L) 02/12/2017   MCV 87.8 02/12/2017   PLT 363 02/12/2017    Recent Labs Lab 02/12/17 0350  NA 136  K 4.0  CL 103  CO2 26  BUN 28*  CREATININE 0.67  CALCIUM 9.1  GLUCOSE 117*   No results found for: CHOL, HDL, LDLCALC, TRIG Lab Results  Component Value Date   DDIMER 0.35 07/24/2016     Radiology Studies    Dg Chest 2 View  Result Date: 01/24/2017 CLINICAL DATA:  46 year old female with metastatic breast cancer. Shortness of breath for 4 days. EXAM: CHEST  2 VIEW COMPARISON:  PET-CT 12/04/2016, chest radiographs 11/22/2016, and earlier FINDINGS: Multiple rounded nodular bilateral mid and lower lung pulmonary metastases appear larger and denser than on 11/22/2016. Associated worsening lung base ventilation Stable lung volumes. Mild interval hilar enlargement. Other mediastinal contours remain stable. Visualized tracheal air column is within normal limits. No pneumothorax or layering pleural effusion. There does appear to be trace pleural fluid in the fissures which has not definitely changed. No definite consolidation. Stable visualized osseous structures. Stable cholecystectomy clips. Negative visible bowel gas pattern. Stable surgical clips at the left axilla and chest wall. IMPRESSION: 1. Radiographically progressed bilateral mid and lower lung metastases since March with associated worsening lung base ventilation. 2. No drainable pleural fluid or other acute cardiopulmonary abnormality identified. Electronically Signed   By: Genevie Ann M.D.   On: 01/24/2017 14:25   Ct Chest W Contrast  Result Date: 01/30/2017 CLINICAL DATA:  Metastatic left breast cancer EXAM: CT CHEST, ABDOMEN, AND PELVIS WITH CONTRAST TECHNIQUE: Multidetector CT imaging of the chest, abdomen and pelvis was performed  following the standard protocol during bolus administration of intravenous contrast. CONTRAST:  17m ISOVUE-300 IOPAMIDOL (ISOVUE-300) INJECTION 61% COMPARISON:  12/04/2016 FINDINGS: CT CHEST FINDINGS Cardiovascular: No acute aortic findings. Extrinsic mass effect on portions of the pulmonary arterial tree due to extensive hilar and mediastinal adenopathy. Mediastinum/Nodes: Extensive than worsened prevascular, AP window, paratracheal, hilar, infrahilar, and internal mammary adenopathy. Conglomerate AP window adenopathy measures 3.6 cm in short axis, previously 2.8 cm. A subcarinal node measures 2.6 cm in short axis, previously 2.2 cm. The size comparisons are made to the prior PET-CT from 12/04/2016. Prior left axillary dissection. Lungs/Pleura: Compared to the prior PET-CT, there is increase in size and number of scattered pulmonary nodules and masses. For example, a pleural-based mass along the lingula measures 1.9 by 2.2 cm, previously 1.5 by 1.0 cm. A masslike density along the right hemidiaphragm measures 8.0 by 5.5 cm on image 93/4, previously 3.0 by 2.3 cm. One example of a new nodule is the 5 mm pleural-based nodule on image 42 series 4 along the right upper lobe. There many an additional new pulmonary nodules. Trace bilateral pleural effusions with pleural masses favoring malignant effusions. Musculoskeletal: New and enlarging nodules and masses along the right breast. A right inferior breast mass measures 4.3 by 3.8 cm on image 42/2 and was previously 1.4 cm in diameter. A medial mass in the right breast measures 2.8 by 1.7 cm on image 32/2 and was previously 0.8 cm in diameter. A new nodule centrally in the right breast measures 1.0 cm in diameter on image 21/2. Other breast and subcutaneous nodules are present on the right. Thoracic spondylosis. CT ABDOMEN PELVIS FINDINGS Hepatobiliary: Mild intrahepatic biliary dilatation. Common bile duct measures 1.2 cm in diameter. No focal hepatic lesion  identified. Pancreas: Unremarkable Spleen: Unremarkable Adrenals/Urinary Tract: Unremarkable Stomach/Bowel: Prominent stool throughout the colon favors constipation. Vascular/Lymphatic: Unremarkable Reproductive: Unremarkable Other: Scattered new subcutaneous nodules along the abdomen are worrisome for subcutaneous deposits of tumor. An index lesion measures 1.5 by 0.9 cm on image 73 of series 2. These are new compared to the prior exam. Musculoskeletal: Lumbar spondylosis and degenerative disc disease with right foraminal impingement at L5-S1 due to spurring, image 93/6. Transitional S1 vertebra. IMPRESSION: 1. Worsened tumor burden with significant abnormal nodal enlargement in the chest; new and enlarged pulmonary nodules and masses; new and enlarged right breast and subcutaneous masses and nodules, compatible with metastatic disease. 2. Trace bilateral pleural effusions may be malignant given the visible pleural nodules. 3.  Prominent stool throughout the colon favors constipation. 4. Mild biliary dilatation, some of which may be a physiologic response to cholecystectomy. 5. Right foraminal impingement at L5-S1 due to spurring. Electronically Signed   By: Van Clines M.D.   On: 01/30/2017 15:18   Ct Abdomen Pelvis W Contrast  Result Date: 01/30/2017 CLINICAL DATA:  Metastatic left breast cancer EXAM: CT CHEST, ABDOMEN, AND PELVIS WITH CONTRAST TECHNIQUE: Multidetector CT imaging of the chest, abdomen and pelvis was performed following the standard protocol during bolus administration of intravenous contrast. CONTRAST:  153m ISOVUE-300 IOPAMIDOL (ISOVUE-300) INJECTION 61% COMPARISON:  12/04/2016 FINDINGS: CT CHEST FINDINGS Cardiovascular: No acute aortic findings. Extrinsic mass effect on portions of the pulmonary arterial tree due to extensive hilar and mediastinal adenopathy. Mediastinum/Nodes: Extensive than worsened prevascular, AP window, paratracheal, hilar, infrahilar, and internal mammary  adenopathy. Conglomerate AP window adenopathy measures 3.6 cm in short axis, previously 2.8 cm. A subcarinal node measures 2.6 cm in short axis, previously 2.2 cm. The size comparisons are made to the prior PET-CT from 12/04/2016. Prior left axillary dissection. Lungs/Pleura: Compared to the prior PET-CT, there is increase in size and number of scattered pulmonary nodules and masses. For example, a pleural-based mass along the lingula measures 1.9 by 2.2 cm, previously 1.5 by 1.0 cm. A masslike density along the right hemidiaphragm measures 8.0 by 5.5 cm on image 93/4, previously 3.0 by 2.3 cm. One example of a new nodule is the 5 mm pleural-based nodule on image 42 series 4 along the right upper lobe. There many an additional new pulmonary nodules. Trace bilateral pleural effusions with pleural masses favoring malignant effusions. Musculoskeletal: New and enlarging nodules and masses along the right breast. A right inferior breast mass measures 4.3 by 3.8 cm on image 42/2 and was previously 1.4 cm in diameter. A medial mass in the right breast measures 2.8 by 1.7 cm on image 32/2 and was previously 0.8 cm in diameter. A new nodule centrally in the right breast measures 1.0 cm in diameter on image 21/2. Other breast and subcutaneous nodules are present on the right. Thoracic spondylosis. CT ABDOMEN PELVIS FINDINGS Hepatobiliary: Mild intrahepatic biliary dilatation. Common bile duct measures 1.2 cm in diameter. No focal hepatic lesion identified. Pancreas: Unremarkable Spleen: Unremarkable Adrenals/Urinary Tract: Unremarkable Stomach/Bowel: Prominent stool throughout the colon favors constipation. Vascular/Lymphatic: Unremarkable Reproductive: Unremarkable Other: Scattered new subcutaneous nodules along the abdomen are worrisome for subcutaneous deposits of tumor. An index lesion measures 1.5 by 0.9 cm on image 73 of series 2. These are new compared to the prior exam. Musculoskeletal: Lumbar spondylosis and  degenerative disc disease with right foraminal impingement at L5-S1 due to spurring, image 93/6. Transitional S1 vertebra. IMPRESSION: 1. Worsened tumor burden with significant abnormal nodal enlargement in the  chest; new and enlarged pulmonary nodules and masses; new and enlarged right breast and subcutaneous masses and nodules, compatible with metastatic disease. 2. Trace bilateral pleural effusions may be malignant given the visible pleural nodules. 3.  Prominent stool throughout the colon favors constipation. 4. Mild biliary dilatation, some of which may be a physiologic response to cholecystectomy. 5. Right foraminal impingement at L5-S1 due to spurring. Electronically Signed   By: Van Clines M.D.   On: 01/30/2017 15:18   Ir Fluoro Guide Cv Line Right  Result Date: 02/11/2017 INDICATION: 46 year old female with a history of metastatic breast carcinoma. She presents for a port catheter placement to initiate chemotherapy. Upon presentation atrial fibrillation with RVR was discovered, which is her first presentation. No history of cardiac problems. Plan is for PICC placement with future port. EXAM: PICC LINE PLACEMENT WITH ULTRASOUND AND FLUOROSCOPIC GUIDANCE MEDICATIONS: None ANESTHESIA/SEDATION: None FLUOROSCOPY TIME:  Fluoroscopy Time: 0 minutes 6 seconds COMPLICATIONS: None PROCEDURE: Informed written consent was obtained from the patient after a thorough discussion of the procedural risks, benefits and alternatives. All questions were addressed. Maximal Sterile Barrier Technique was utilized including caps, mask, sterile gowns, sterile gloves, sterile drape, hand hygiene and skin antiseptic. A timeout was performed prior to the initiation of the procedure. Patient was position in the supine position on the fluoroscopy table with the right arm abducted 90 degrees. Ultrasound survey of the upper extremity was performed with images stored and sent to PACs. The right brachial vein was selected for  access. Once the patient was prepped and draped in the usual sterile fashion, the skin and subcutaneous tissues were generously infiltrated with 1% lidocaine for local anesthesia. A micropuncture access kit was then used to access the targeted vein. Wire was passed centrally, confirmed to be within the venous system under fluoroscopy. A small stab incision was made with an 11 blade scalpel and the sheath was then placed over the wire. Estimated length of the catheter was then performed with the indwelling wire. Catheter was amputated at 41 cm length and placed with coaxial wire through the peel-away. Double-lumen, power injectable PICC in the brachial vein. Tip confirmed at the cavoatrial junction, and the catheter is ready for use. Stat lock was placed. Patient tolerated the procedure well and remained hemodynamically stable throughout. No complications were encountered and no significant blood loss was encountered. IMPRESSION: Status post right brachial vein PICC.  Catheter ready for use. Signed, Dulcy Fanny. [name], DO [facility] Radiology Electronically Signed   By: Corrie Mckusick D.O.   On: 02/11/2017 13:10   Ir US Guide Vasc Access Right  Result Date: 02/11/2017 INDICATION: 46 year old female with a history of metastatic breast carcinoma. She presents for a port catheter placement to initiate chemotherapy. Upon presentation atrial fibrillation with RVR was discovered, which is her first presentation. No history of cardiac problems. Plan is for PICC placement with future port. EXAM: PICC LINE PLACEMENT WITH ULTRASOUND AND FLUOROSCOPIC GUIDANCE MEDICATIONS: None ANESTHESIA/SEDATION: None FLUOROSCOPY TIME:  Fluoroscopy Time: 0 minutes 6 seconds COMPLICATIONS: None PROCEDURE: Informed written consent was obtained from the patient after a thorough discussion of the procedural risks, benefits and alternatives. All questions were addressed. Maximal Sterile Barrier  Technique was utilized including caps, mask, sterile gowns, sterile gloves, sterile drape, hand hygiene and skin antiseptic. A timeout was performed prior to the initiation of the procedure. Patient was position in the supine position on the fluoroscopy table with the right arm abducted 90 degrees. Ultrasound survey  of the upper extremity was performed with images stored and sent to PACs. The right brachial vein was selected for access. Once the patient was prepped and draped in the usual sterile fashion, the skin and subcutaneous tissues were generously infiltrated with 1% lidocaine for local anesthesia. A micropuncture access kit was then used to access the targeted vein. Wire was passed centrally, confirmed to be within the venous system under fluoroscopy. A small stab incision was made with an 11 blade scalpel and the sheath was then placed over the wire. Estimated length of the catheter was then performed with the indwelling wire. Catheter was amputated at 41 cm length and placed with coaxial wire through the peel-away. Double-lumen, power injectable PICC in the brachial vein. Tip confirmed at the cavoatrial junction, and the catheter is ready for use. Stat lock was placed. Patient tolerated the procedure well and remained hemodynamically stable throughout. No complications were encountered and no significant blood loss was encountered. IMPRESSION: Status post right brachial vein PICC.  Catheter ready for use. Signed, Dulcy Fanny. [name], DO [facility] Radiology Electronically Signed   By: Corrie Mckusick D.O.   On: 02/11/2017 13:10    ECG & Cardiac Imaging    EKG 02/11/17: Afib/flutter ventricular rate 130   Echocardiogram 02/12/17:  Study Conclusions - Left ventricle: The cavity size was severely dilated. Wall   thickness was normal. Systolic function was severely reduced. The   estimated ejection fraction was in the range of 25% to 30%.   Diffuse  hypokinesis. The study is not technically sufficient to   allow evaluation of LV diastolic function. - Mitral valve: There was mild regurgitation. - Left atrium: The atrium was moderately dilated. - Right atrium: The atrium was mildly dilated.   Left heart cath 01/24/16: 1. Angiographically normal coronary arteries 2. The left ventricular systolic function is normal. Essentially normal coronary arteries with no significant coronary disease noted to explain abnormal stress test.  Suspect breast attenuation is the explanation for the false-positive stress test. I suspect noncardiac chest pain   Myoview 01/23/16: 1. Positive for moderate size region of reversible ischemia in the anteroseptal wall of the mid ventricle extending through the apex to the true apex. 2. Normal left ventricular wall motion. 3. Left ventricular ejection fraction 54% 4. Non invasive risk stratification*: Intermediate   Echocardiogram 09/13/15: Study Conclusions - Left ventricle: The cavity size was normal. There was mild focal   basal hypertrophy of the septum. Systolic function was normal.   The estimated ejection fraction was in the range of 55% to 60%.   Wall motion was normal; there were no regional wall motion   abnormalities. Global longitudinal strain: -18.4% There was no   evidence of elevated ventricular filling pressure by Doppler   parameters.    Assessment & Plan    1. Atrial fibrillation - started cardizem drip in ED, now running at 7.5 mg/hr - Pt appears to be in NSR on telemetry in the 90-110s with bouts of Afib RVR for which she spontaneously converts back to NSR - diltiazem may not be the best medication for her Afib in the setting of new systolic heart failure - recommend switching to lopressor, will start 25 mg lopressor TID This patients CHA2DS2-VASc Score and unadjusted Ischemic Stroke Rate (% per year) is equal to 2.2 % stroke rate/year from a score of 2 (CHF, female). She is not  currently anticoagulated - will start eliquis if OK with oncology   2.  New onset systolic heart failure - Echo today with LVEF 25-30%, which is new since Jan 2017 echo; however, echo may have been completed with patient in Afib. Will hold off on medication changes until attending can review echo.   3. Elevated troponin - 0.03 -->  >0.03 - mildly elevated likely related to RVR.    Signed, Tami Lin Duke, PA-C 02/12/2017, 7:34 AM [phone]

## 2017-02-13 DIAGNOSIS — C7801 Secondary malignant neoplasm of right lung: Secondary | ICD-10-CM | POA: Diagnosis not present

## 2017-02-13 DIAGNOSIS — D630 Anemia in neoplastic disease: Secondary | ICD-10-CM | POA: Diagnosis not present

## 2017-02-13 DIAGNOSIS — C7951 Secondary malignant neoplasm of bone: Secondary | ICD-10-CM | POA: Diagnosis not present

## 2017-02-13 DIAGNOSIS — I4891 Unspecified atrial fibrillation: Secondary | ICD-10-CM | POA: Diagnosis not present

## 2017-02-13 DIAGNOSIS — C50412 Malignant neoplasm of upper-outer quadrant of left female breast: Secondary | ICD-10-CM

## 2017-02-13 DIAGNOSIS — C50919 Malignant neoplasm of unspecified site of unspecified female breast: Secondary | ICD-10-CM | POA: Diagnosis not present

## 2017-02-13 DIAGNOSIS — I420 Dilated cardiomyopathy: Secondary | ICD-10-CM

## 2017-02-13 DIAGNOSIS — C7802 Secondary malignant neoplasm of left lung: Secondary | ICD-10-CM | POA: Diagnosis not present

## 2017-02-13 DIAGNOSIS — R072 Precordial pain: Secondary | ICD-10-CM

## 2017-02-13 DIAGNOSIS — R079 Chest pain, unspecified: Secondary | ICD-10-CM | POA: Diagnosis not present

## 2017-02-13 LAB — BASIC METABOLIC PANEL
ANION GAP: 5 (ref 5–15)
BUN: 24 mg/dL — AB (ref 6–20)
CALCIUM: 10 mg/dL (ref 8.9–10.3)
CO2: 27 mmol/L (ref 22–32)
CREATININE: 0.61 mg/dL (ref 0.44–1.00)
Chloride: 105 mmol/L (ref 101–111)
GFR calc Af Amer: >60 mL/min (ref >60)
GLUCOSE: 114 mg/dL — AB (ref 65–99)
Potassium: 4.5 mmol/L (ref 3.5–5.1)
Sodium: 137 mmol/L (ref 135–145)

## 2017-02-13 LAB — CBC
HEMATOCRIT: 32.6 % — AB (ref 36.0–46.0)
Hemoglobin: 10.3 g/dL — ABNORMAL LOW (ref 12.0–15.0)
MCH: 27.3 pg (ref 26.0–34.0)
MCHC: 31.6 g/dL (ref 30.0–36.0)
MCV: 86.5 fL (ref 78.0–100.0)
PLATELETS: 380 10*3/uL (ref 150–400)
RBC: 3.77 MIL/uL — ABNORMAL LOW (ref 3.87–5.11)
RDW: 18.5 % — AB (ref 11.5–15.5)
WBC: 6.3 10*3/uL (ref 4.0–10.5)

## 2017-02-13 MED ORDER — LOSARTAN POTASSIUM 25 MG PO TABS
25.0000 mg | ORAL_TABLET | Freq: Every day | ORAL | Status: DC
  Administered 2017-02-13 – 2017-02-14 (×2): 25 mg via ORAL
  Filled 2017-02-13 (×2): qty 1

## 2017-02-13 MED ORDER — TRAMADOL HCL 50 MG PO TABS
50.0000 mg | ORAL_TABLET | Freq: Four times a day (QID) | ORAL | Status: DC | PRN
  Administered 2017-02-13: 50 mg via ORAL
  Filled 2017-02-13: qty 1

## 2017-02-13 MED ORDER — MORPHINE SULFATE (PF) 10 MG/ML IV SOLN
1.0000 mg | INTRAVENOUS | Status: AC | PRN
  Administered 2017-02-14 (×2): 1 mg via INTRAVENOUS
  Filled 2017-02-13 (×2): qty 1

## 2017-02-13 MED ORDER — AMIODARONE HCL 200 MG PO TABS
400.0000 mg | ORAL_TABLET | Freq: Two times a day (BID) | ORAL | Status: DC
  Administered 2017-02-13 – 2017-02-14 (×2): 400 mg via ORAL
  Filled 2017-02-13 (×2): qty 2

## 2017-02-13 MED ORDER — ALUM & MAG HYDROXIDE-SIMETH 200-200-20 MG/5ML PO SUSP
30.0000 mL | Freq: Once | ORAL | Status: AC
  Administered 2017-02-13: 30 mL via ORAL
  Filled 2017-02-13: qty 30

## 2017-02-13 NOTE — Progress Notes (Signed)
PROGRESS NOTE  Cindy Bradshaw JQG:[id number] DOB: 29-Oct-1970 DOA: 02/11/2017 PCP: [name], MD   LOS: 2 days   Brief Narrative / Interim history: 46 year old female with medical history significant for upper left breast cancer, initial diagnosis in December 2016, status post neoadjuvant chemotherapy from January 2017 through May 2017, left lumpectomy 02/2016, RT to left breast 03/2016 - 05/2016, recurrence and mets to lungs 11/2016, started xeloda 12/20/2016, under the care of Dr. Lindi Adie.  She was in the radiology getting a Port-A-Cath when examination revealed the patient had an irregular heartbeat. EKG revealed atrial fibrillation with RVR. A PICC line was subsequently placed in lieu of the Port-A-Cath. The patient was sent to emergency department for further evaluation.  Assessment & Plan: Principal Problem:   Atrial fibrillation with RVR (HCC) Active Problems:   Breast cancer of upper-outer quadrant of left female breast (HCC)   Chest pain   Metastatic breast cancer (HCC)   DCM (dilated cardiomyopathy) (HCC)   A. fib with RVR -Likely in the setting of new onset systolic CHF, probably nonischemic given normal cardiac catheterization in 2017 -Cardiology following, patient was started on amiodarone for rate control, she appears in sinus on telemetry with some premature beats -Obtain VQ scan due to dyspnea, low probability for PE -Started on anticoagulation with Xarelto, continue  Systolic CHF -2D echo with ejection fraction 25-30% from prior normal values -She appears compensated -Cardiology following  Atypical chest pain -Patient had a cardiac catheterization about a year ago with methicillin 17 which is normal -VQ scan negative for PE  Metastatic breast cancer to lung -Continue Xeloda  Chemotherapy related anemia -Hemoglobin stable  Elevated troponin -Likely demand the setting of A. fib with RVR, trend not in a pattern consistent with ACS   DVT prophylaxis:  Xarelto Code Status: Full code Family Communication: no family at bedside Disposition Plan: home when ready  Consultants:   Cardiology   Procedures:   2D echo  Antimicrobials:  None    Subjective: - no chest pain, shortness of breath, no abdominal pain, nausea or vomiting. Wants to go home   Objective: Vitals:   02/13/17 0600 02/13/17 0700 02/13/17 0800 02/13/17 0900  BP: 116/64  135/78   Pulse: 84 85 79 87  Resp: (!) 23 20 20  (!) 25  Temp: 98.1 F (36.7 C)  98.2 F (36.8 C)   TempSrc: Oral  Oral   SpO2: 98% 98% 97% 98%  Weight:      Height:        Intake/Output Summary (Last 24 hours) at 02/13/17 1048 Last data filed at 02/13/17 0800  Gross per 24 hour  Intake            33.38 ml  Output                0 ml  Net            33.38 ml   Filed Weights   02/11/17 1700 02/12/17 0323 02/13/17 0500  Weight: 93.3 kg (205 lb 11 oz) 93.7 kg (206 lb 9.1 oz) 93.8 kg (206 lb 12.7 oz)    Examination:  Vitals:   02/13/17 0600 02/13/17 0700 02/13/17 0800 02/13/17 0900  BP: 116/64  135/78   Pulse: 84 85 79 87  Resp: (!) 23 20 20  (!) 25  Temp: 98.1 F (36.7 C)  98.2 F (36.8 C)   TempSrc: Oral  Oral   SpO2: 98% 98% 97% 98%  Weight:      Height:  Constitutional: NAD Neck: normal, supple, no masses, no thyromegaly Respiratory: clear to auscultation bilaterally, no wheezing, no crackles.  Cardiovascular: Regular rate and rhythm, no murmurs / rubs / gallops. No LE edema. 2+ pedal pulses.  Abdomen: no tenderness. Bowel sounds positive.    Data Reviewed: I have independently reviewed following labs and imaging studies  CBC:  Recent Labs Lab 02/11/17 1015 02/11/17 1339 02/12/17 0350 02/13/17 0607  WBC 7.2 6.4 5.1 6.3  NEUTROABS  --  4.3  --   --   HGB 10.1* 10.1* 9.5* 10.3*  HCT 31.6* 31.7* 30.2* 32.6*  MCV 86.3 86.1 87.8 86.5  PLT 455* 409* 363 759   Basic Metabolic Panel:  Recent Labs Lab 02/11/17 1339 02/12/17 0350 02/13/17 0607  NA 133*  136 137  K 4.3 4.0 4.5  CL 100* 103 105  CO2 27 26 27   GLUCOSE 99 117* 114*  BUN 25* 28* 24*  CREATININE 0.62 0.67 0.61  CALCIUM 10.3 9.1 10.0   GFR: Estimated Creatinine Clearance: 102.2 mL/min (by C-G formula based on SCr of 0.61 mg/dL). Liver Function Tests: No results for input(s): AST, ALT, ALKPHOS, BILITOT, PROT, ALBUMIN in the last 168 hours. No results for input(s): LIPASE, AMYLASE in the last 168 hours. No results for input(s): AMMONIA in the last 168 hours. Coagulation Profile:  Recent Labs Lab 02/11/17 1015  INR 1.13   Cardiac Enzymes:  Recent Labs Lab 02/11/17 1925 02/12/17 0313 02/12/17 1353  TROPONINI 0.03* <0.03 <0.03   BNP (last 3 results) No results for input(s): PROBNP in the last 8760 hours. HbA1C: No results for input(s): HGBA1C in the last 72 hours. CBG: No results for input(s): GLUCAP in the last 168 hours. Lipid Profile: No results for input(s): CHOL, HDL, LDLCALC, TRIG, CHOLHDL, LDLDIRECT in the last 72 hours. Thyroid Function Tests:  Recent Labs  02/11/17 1339  TSH 0.453  FREET4 0.84   Anemia Panel: No results for input(s): VITAMINB12, FOLATE, FERRITIN, TIBC, IRON, RETICCTPCT in the last 72 hours. Urine analysis:    Component Value Date/Time   COLORURINE YELLOW 07/03/2016 0557   APPEARANCEUR CLOUDY (A) 07/03/2016 0557   LABSPEC 1.021 07/03/2016 0557   LABSPEC 1.010 04/10/2016 1224   PHURINE 6.0 07/03/2016 0557   GLUCOSEU NEGATIVE 07/03/2016 0557   GLUCOSEU Negative 04/10/2016 1224   HGBUR SMALL (A) 07/03/2016 0557   BILIRUBINUR NEGATIVE 07/03/2016 0557   BILIRUBINUR Negative 04/10/2016 [place] 07/03/2016 0557   PROTEINUR NEGATIVE 07/03/2016 0557   UROBILINOGEN 0.2 04/10/2016 1224   NITRITE NEGATIVE 07/03/2016 0557   LEUKOCYTESUR NEGATIVE 07/03/2016 0557   LEUKOCYTESUR Negative 04/10/2016 1224   Sepsis Labs: Invalid input(s): PROCALCITONIN, LACTICIDVEN  Recent Results (from the past 240 hour(s))  MRSA  PCR Screening     Status: None   Collection Time: 02/11/17  4:17 PM  Result Value Ref Range Status   MRSA by PCR NEGATIVE NEGATIVE Final    Comment:        The GeneXpert MRSA Assay (FDA approved for NASAL specimens only), is one component of a comprehensive MRSA colonization surveillance program. It is not intended to diagnose MRSA infection nor to guide or monitor treatment for MRSA infections.       Radiology Studies: Dg Chest 2 View  Result Date: 02/12/2017 CLINICAL DATA:  Chest x-ray to cup the ventilation and perfusion lung scan today. EXAM: CHEST  2 VIEW COMPARISON:  Chest x-ray of Jan 24, 2017 FINDINGS: The lungs are borderline hypoinflated. There patchy alveolar opacities  in both lungs not greatly changed from the previous study. The cardiac silhouette is enlarged. The AP window region is prominent. The PICC line tip projects over the midportion of the SVC. The bony thorax is unremarkable. IMPRESSION: Persistent bilateral alveolar opacities consistent known metastatic disease. Cardiomegaly without pulmonary edema. Soft tissue fullness in the AP window region consistent with known lymphadenopathy. Electronically Signed   By: David  Martinique M.D.   On: 02/12/2017 11:46   Nm Pulmonary Perf And Vent  Result Date: 02/12/2017 CLINICAL DATA:  Metastatic breast cancer and COPD. Short of breath. Irregular heart rate. EXAM: NUCLEAR MEDICINE VENTILATION - PERFUSION LUNG SCAN TECHNIQUE: Ventilation images were obtained in multiple projections using inhaled aerosol Tc-65mDTPA. Perfusion images were obtained in multiple projections after intravenous injection of Tc-985mAA. RADIOPHARMACEUTICALS:  32.4 MCi Technetium-9931mPA aerosol inhalation and 4.3 mCi Technetium-35m27m IV COMPARISON:  PET-CT, 12/04/2016 FINDINGS: Ventilation: There are patchy areas of decreased ventilation in both lungs. Perfusion: There are patchy areas of nonsegmental decreased profusion, which correspond to the areas of  decreased ventilation. There are no segmental perfusion defects, mismatched to ventilation, to suggest a pulmonary thromboembolism. IMPRESSION: 1. No evidence of pulmonary thromboembolism. There matched nonsegmental ventilation and perfusion abnormalities which are likely from the known metastatic disease. Electronically Signed   By: DaviLajean Manes.   On: 02/12/2017 11:48   Ir Fluoro Guide Cv Line Right  Result Date: 02/11/2017 INDICATION: 46 y12r old female with a history of metastatic breast carcinoma. She presents for a port catheter placement to initiate chemotherapy. Upon presentation atrial fibrillation with RVR was discovered, which is her first presentation. No history of cardiac problems. Plan is for PICC placement with future port. EXAM: PICC LINE PLACEMENT WITH ULTRASOUND AND FLUOROSCOPIC GUIDANCE MEDICATIONS: None ANESTHESIA/SEDATION: None FLUOROSCOPY TIME:  Fluoroscopy Time: 0 minutes 6 seconds COMPLICATIONS: None PROCEDURE: Informed written consent was obtained from the patient after a thorough discussion of the procedural risks, benefits and alternatives. All questions were addressed. Maximal Sterile Barrier Technique was utilized including caps, mask, sterile gowns, sterile gloves, sterile drape, hand hygiene and skin antiseptic. A timeout was performed prior to the initiation of the procedure. Patient was position in the supine position on the fluoroscopy table with the right arm abducted 90 degrees. Ultrasound survey of the upper extremity was performed with images stored and sent to PACs. The right brachial vein was selected for access. Once the patient was prepped and draped in the usual sterile fashion, the skin and subcutaneous tissues were generously infiltrated with 1% lidocaine for local anesthesia. A micropuncture access kit was then used to access the targeted vein. Wire was passed centrally, confirmed to be within the venous system under fluoroscopy. A small stab incision was made  with an 11 blade scalpel and the sheath was then placed over the wire. Estimated length of the catheter was then performed with the indwelling wire. Catheter was amputated at 41 cm length and placed with coaxial wire through the peel-away. Double-lumen, power injectable PICC in the brachial vein. Tip confirmed at the cavoatrial junction, and the catheter is ready for use. Stat lock was placed. Patient tolerated the procedure well and remained hemodynamically stable throughout. No complications were encountered and no significant blood loss was encountered. IMPRESSION: Status post right brachial vein PICC.  Catheter ready for use. Signed, JaimDulcy FannygnEarleen Newport [facility] Electronically Signed   By: JaimCorrie Mckusick.   On: 02/11/2017 13:10   Ir Us GKoreade Vasc  Access Right  Result Date: 02/11/2017 INDICATION: 46 year old female with a history of metastatic breast carcinoma. She presents for a port catheter placement to initiate chemotherapy. Upon presentation atrial fibrillation with RVR was discovered, which is her first presentation. No history of cardiac problems. Plan is for PICC placement with future port. EXAM: PICC LINE PLACEMENT WITH ULTRASOUND AND FLUOROSCOPIC GUIDANCE MEDICATIONS: None ANESTHESIA/SEDATION: None FLUOROSCOPY TIME:  Fluoroscopy Time: 0 minutes 6 seconds COMPLICATIONS: None PROCEDURE: Informed written consent was obtained from the patient after a thorough discussion of the procedural risks, benefits and alternatives. All questions were addressed. Maximal Sterile Barrier Technique was utilized including caps, mask, sterile gowns, sterile gloves, sterile drape, hand hygiene and skin antiseptic. A timeout was performed prior to the initiation of the procedure. Patient was position in the supine position on the fluoroscopy table with the right arm abducted 90 degrees. Ultrasound survey of the upper extremity was performed with images  stored and sent to PACs. The right brachial vein was selected for access. Once the patient was prepped and draped in the usual sterile fashion, the skin and subcutaneous tissues were generously infiltrated with 1% lidocaine for local anesthesia. A micropuncture access kit was then used to access the targeted vein. Wire was passed centrally, confirmed to be within the venous system under fluoroscopy. A small stab incision was made with an 11 blade scalpel and the sheath was then placed over the wire. Estimated length of the catheter was then performed with the indwelling wire. Catheter was amputated at 41 cm length and placed with coaxial wire through the peel-away. Double-lumen, power injectable PICC in the brachial vein. Tip confirmed at the cavoatrial junction, and the catheter is ready for use. Stat lock was placed. Patient tolerated the procedure well and remained hemodynamically stable throughout. No complications were encountered and no significant blood loss was encountered. IMPRESSION: Status post right brachial vein PICC.  Catheter ready for use. Signed, Dulcy Fanny. [name], DO [facility] Electronically Signed   By: Corrie Mckusick D.O.   On: 02/11/2017 13:10     Scheduled Meds: . amiodarone  200 mg Oral BID  . capecitabine  1,500 mg Oral BID PC  . Chlorhexidine Gluconate Cloth  6 each Topical Daily  . dexamethasone  4 mg Oral Daily  . rivaroxaban  20 mg Oral Q supper   Continuous Infusions:  Marzetta Board, MD, PhD [facility] Pager [phone]  If 7PM-7AM, please contact night-coverage [email or web address] Password [facility] 02/13/2017, 10:48 AM

## 2017-02-13 NOTE — Discharge Instructions (Addendum)
Information on my medicine - XARELTO (Rivaroxaban)  This medication education was reviewed with me or my healthcare representative as part of my discharge preparation.  The pharmacist that spoke with me during my hospital stay was:  Altha Harm   Why was Xarelto prescribed for you? Xarelto was prescribed for you to reduce the risk of a blood clot forming that can cause a stroke if you have a medical condition called atrial fibrillation (a type of irregular heartbeat).  What do you need to know about xarelto ? Take your Xarelto ONCE DAILY at the same time every day with your evening meal. If you have difficulty swallowing the tablet whole, you may crush it and mix in applesauce just prior to taking your dose.  Take Xarelto exactly as prescribed by your doctor and DO NOT stop taking Xarelto without talking to the doctor who prescribed the medication.  Stopping without other stroke prevention medication to take the place of Xarelto may increase your risk of developing a clot that causes a stroke.  Refill your prescription before you run out.  After discharge, you should have regular check-up appointments with your healthcare provider that is prescribing your Xarelto.  In the future your dose may need to be changed if your kidney function or weight changes by a significant amount.  What do you do if you miss a dose? If you are taking Xarelto ONCE DAILY and you miss a dose, take it as soon as you remember on the same day then continue your regularly scheduled once daily regimen the next day. Do not take two doses of Xarelto at the same time or on the same day.   Important Safety Information A possible side effect of Xarelto is bleeding. You should call your healthcare provider right away if you experience any of the following: ? Bleeding from an injury or your nose that does not stop. ? Unusual colored urine (red or dark brown) or unusual colored stools (red or black). ? Unusual bruising  for unknown reasons. ? A serious fall or if you hit your head (even if there is no bleeding).  Some medicines may interact with Xarelto and might increase your risk of bleeding while on Xarelto. To help avoid this, consult your healthcare provider or pharmacist prior to using any new prescription or non-prescription medications, including herbals, vitamins, non-steroidal anti-inflammatory drugs (NSAIDs) and supplements.  This website has more information on Xarelto: [email or web address]     Follow with PCP in 5-7 days  Please get a complete blood count and chemistry panel checked by your Primary MD at your next visit, and again as instructed by your Primary MD. Please get your medications reviewed and adjusted by your Primary MD.  Please request your Primary MD to go over all Hospital Tests and Procedure/Radiological results at the follow up, please get all Hospital records sent to your Prim MD by signing hospital release before you go home.  If you had Pneumonia of Lung problems at the Hospital: Please get a 2 view Chest X ray done in 6-8 weeks after hospital discharge or sooner if instructed by your Primary MD.  If you have Congestive Heart Failure: Please call your Cardiologist or Primary MD anytime you have any of the following symptoms:  1) 3 pound weight gain in 24 hours or 5 pounds in 1 week  2) shortness of breath, with or without a dry hacking cough  3) swelling in the hands, feet or stomach  4) if you have to sleep  on extra pillows at night in order to breathe  Follow cardiac low salt diet and 1.5 lit/day fluid restriction.  If you have diabetes Accuchecks 4 times/day, Once in AM empty stomach and then before each meal. Log in all results and show them to your primary doctor at your next visit. If any glucose reading is under 80 or above 300 call your primary MD immediately.  If you have Seizure/Convulsions/Epilepsy: Please do not drive, operate heavy machinery, participate  in activities at heights or participate in high speed sports until you have seen by Primary MD or a Neurologist and advised to do so again.  If you had Gastrointestinal Bleeding: Please ask your Primary MD to check a complete blood count within one week of discharge or at your next visit. Your endoscopic/colonoscopic biopsies that are pending at the time of discharge, will also need to followed by your Primary MD.  Get Medicines reviewed and adjusted. Please take all your medications with you for your next visit with your Primary MD  Please request your Primary MD to go over all hospital tests and procedure/radiological results at the follow up, please ask your Primary MD to get all Hospital records sent to his/her office.  If you experience worsening of your admission symptoms, develop shortness of breath, life threatening emergency, suicidal or homicidal thoughts you must seek medical attention immediately by calling 911 or calling your MD immediately  if symptoms less severe.  You must read complete instructions/literature along with all the possible adverse reactions/side effects for all the Medicines you take and that have been prescribed to you. Take any new Medicines after you have completely understood and accpet all the possible adverse reactions/side effects.   Do not drive or operate heavy machinery when taking Pain medications.   Do not take more than prescribed Pain, Sleep and Anxiety Medications  Special Instructions: If you have smoked or chewed Tobacco  in the last 2 yrs please stop smoking, stop any regular Alcohol  and or any Recreational drug use.  Wear Seat belts while driving.  Please note You were cared for by a hospitalist during your hospital stay. If you have any questions about your discharge medications or the care you received while you were in the hospital after you are discharged, you can call the unit and asked to speak with the hospitalist on call if the  hospitalist that took care of you is not available. Once you are discharged, your primary care physician will handle any further medical issues. Please note that NO REFILLS for any discharge medications will be authorized once you are discharged, as it is imperative that you return to your primary care physician (or establish a relationship with a primary care physician if you do not have one) for your aftercare needs so that they can reassess your need for medications and monitor your lab values.  You can reach the hospitalist office at phone [phone] or fax [phone]   If you do not have a primary care physician, you can call [phone] for a physician referral.  Activity: As tolerated with Full fall precautions use walker/cane & assistance as needed  Diet: heart healthy, low salt  Disposition Home     Please take Amiodarone as folows:  400 mg twice daily for 7 days then 200 mg twice daily for 14 days then 200 mg daily.

## 2017-02-13 NOTE — Progress Notes (Signed)
Progress Note  Patient Name: Cindy Bradshaw Date of Encounter: 02/13/2017  Primary Cardiologist: Dr. Johnsie Cancel  Subjective   Patient is feeling well; denies chest pain, SOB, and palpitations. She is asymptomatic with her bouts of Afib overnight. She wants to discharge home.  Inpatient Medications    Scheduled Meds: . amiodarone  200 mg Oral BID  . capecitabine  1,500 mg Oral BID PC  . Chlorhexidine Gluconate Cloth  6 each Topical Daily  . dexamethasone  4 mg Oral Daily  . rivaroxaban  20 mg Oral Q supper   Continuous Infusions:  PRN Meds: acetaminophen, albuterol, ondansetron (ZOFRAN) IV, sodium chloride flush, technetium TC 32M diethylenetriame-pentaacetic acid   Vital Signs    Vitals:   02/13/17 0600 02/13/17 0700 02/13/17 0800 02/13/17 0900  BP: 116/64  135/78   Pulse: 84 85 79 87  Resp: (!) 23 20 20  (!) 25  Temp: 98.1 F (36.7 C)  98.2 F (36.8 C)   TempSrc: Oral  Oral   SpO2: 98% 98% 97% 98%  Weight:      Height:        Intake/Output Summary (Last 24 hours) at 02/13/17 1029 Last data filed at 02/13/17 0800  Gross per 24 hour  Intake            33.38 ml  Output                0 ml  Net            33.38 ml   Filed Weights   02/11/17 1700 02/12/17 0323 02/13/17 0500  Weight: 205 lb 11 oz (93.3 kg) 206 lb 9.1 oz (93.7 kg) 206 lb 12.7 oz (93.8 kg)     Physical Exam   General: Well developed, well nourished, female appearing in no acute distress. Head: Normocephalic, atraumatic.  Neck: Supple without bruits, no JVD Lungs:  Resp regular and unlabored, CTA. Heart: RRR, S1, S2, no murmur; no rub. Abdomen: Soft, non-tender, non-distended with normoactive bowel sounds. No hepatomegaly. No rebound/guarding. No obvious abdominal masses. Extremities: No clubbing, cyanosis, no edema. Distal pedal pulses are 2+ bilaterally. Neuro: Alert and oriented X 3. Moves all extremities spontaneously. Psych: Normal affect.  Labs    Chemistry Recent Labs Lab  02/11/17 1339 02/12/17 0350 02/13/17 0607  NA 133* 136 137  K 4.3 4.0 4.5  CL 100* 103 105  CO2 27 26 27   GLUCOSE 99 117* 114*  BUN 25* 28* 24*  CREATININE 0.62 0.67 0.61  CALCIUM 10.3 9.1 10.0  GFRNONAA >60 >60 >60  GFRAA >60 >60 >60  ANIONGAP 6 7 5      Hematology Recent Labs Lab 02/11/17 1339 02/12/17 0350 02/13/17 0607  WBC 6.4 5.1 6.3  RBC 3.68* 3.44* 3.77*  HGB 10.1* 9.5* 10.3*  HCT 31.7* 30.2* 32.6*  MCV 86.1 87.8 86.5  MCH 27.4 27.6 27.3  MCHC 31.9 31.5 31.6  RDW 17.6* 18.1* 18.5*  PLT 409* 363 380    Cardiac Enzymes Recent Labs Lab 02/11/17 1925 02/12/17 0313 02/12/17 1353  TROPONINI 0.03* <0.03 <0.03    Recent Labs Lab 02/11/17 1352  TROPIPOC 0.02     BNPNo results for input(s): BNP, PROBNP in the last 168 hours.   DDimer No results for input(s): DDIMER in the last 168 hours.   Radiology    Dg Chest 2 View  Result Date: 02/12/2017 CLINICAL DATA:  Chest x-ray to cup the ventilation and perfusion lung scan today. EXAM: CHEST  2 VIEW  COMPARISON:  Chest x-ray of Jan 24, 2017 FINDINGS: The lungs are borderline hypoinflated. There patchy alveolar opacities in both lungs not greatly changed from the previous study. The cardiac silhouette is enlarged. The AP window region is prominent. The PICC line tip projects over the midportion of the SVC. The bony thorax is unremarkable. IMPRESSION: Persistent bilateral alveolar opacities consistent known metastatic disease. Cardiomegaly without pulmonary edema. Soft tissue fullness in the AP window region consistent with known lymphadenopathy. Electronically Signed   By: David  Martinique M.D.   On: 02/12/2017 11:46   Nm Pulmonary Perf And Vent  Result Date: 02/12/2017 CLINICAL DATA:  Metastatic breast cancer and COPD. Short of breath. Irregular heart rate. EXAM: NUCLEAR MEDICINE VENTILATION - PERFUSION LUNG SCAN TECHNIQUE: Ventilation images were obtained in multiple projections using inhaled aerosol Tc-51mDTPA.  Perfusion images were obtained in multiple projections after intravenous injection of Tc-961mAA. RADIOPHARMACEUTICALS:  32.4 MCi Technetium-9967mPA aerosol inhalation and 4.3 mCi Technetium-15m23m IV COMPARISON:  PET-CT, 12/04/2016 FINDINGS: Ventilation: There are patchy areas of decreased ventilation in both lungs. Perfusion: There are patchy areas of nonsegmental decreased profusion, which correspond to the areas of decreased ventilation. There are no segmental perfusion defects, mismatched to ventilation, to suggest a pulmonary thromboembolism. IMPRESSION: 1. No evidence of pulmonary thromboembolism. There matched nonsegmental ventilation and perfusion abnormalities which are likely from the known metastatic disease. Electronically Signed   By: DaviLajean Manes.   On: 02/12/2017 11:48   Ir Fluoro Guide Cv Line Right  Result Date: 02/11/2017 INDICATION: 46 y60r old female with a history of metastatic breast carcinoma. She presents for a port catheter placement to initiate chemotherapy. Upon presentation atrial fibrillation with RVR was discovered, which is her first presentation. No history of cardiac problems. Plan is for PICC placement with future port. EXAM: PICC LINE PLACEMENT WITH ULTRASOUND AND FLUOROSCOPIC GUIDANCE MEDICATIONS: None ANESTHESIA/SEDATION: None FLUOROSCOPY TIME:  Fluoroscopy Time: 0 minutes 6 seconds COMPLICATIONS: None PROCEDURE: Informed written consent was obtained from the patient after a thorough discussion of the procedural risks, benefits and alternatives. All questions were addressed. Maximal Sterile Barrier Technique was utilized including caps, mask, sterile gowns, sterile gloves, sterile drape, hand hygiene and skin antiseptic. A timeout was performed prior to the initiation of the procedure. Patient was position in the supine position on the fluoroscopy table with the right arm abducted 90 degrees. Ultrasound survey of the upper extremity was performed with images stored  and sent to PACs. The right brachial vein was selected for access. Once the patient was prepped and draped in the usual sterile fashion, the skin and subcutaneous tissues were generously infiltrated with 1% lidocaine for local anesthesia. A micropuncture access kit was then used to access the targeted vein. Wire was passed centrally, confirmed to be within the venous system under fluoroscopy. A small stab incision was made with an 11 blade scalpel and the sheath was then placed over the wire. Estimated length of the catheter was then performed with the indwelling wire. Catheter was amputated at 41 cm length and placed with coaxial wire through the peel-away. Double-lumen, power injectable PICC in the brachial vein. Tip confirmed at the cavoatrial junction, and the catheter is ready for use. Stat lock was placed. Patient tolerated the procedure well and remained hemodynamically stable throughout. No complications were encountered and no significant blood loss was encountered. IMPRESSION: Status post right brachial vein PICC.  Catheter ready for use. Signed, JaimDulcy FannygnEarleen Newport [facility] Electronically Signed  By: Corrie Mckusick D.O.   On: 02/11/2017 13:10   Ir US Guide Vasc Access Right  Result Date: 02/11/2017 INDICATION: 46 year old female with a history of metastatic breast carcinoma. She presents for a port catheter placement to initiate chemotherapy. Upon presentation atrial fibrillation with RVR was discovered, which is her first presentation. No history of cardiac problems. Plan is for PICC placement with future port. EXAM: PICC LINE PLACEMENT WITH ULTRASOUND AND FLUOROSCOPIC GUIDANCE MEDICATIONS: None ANESTHESIA/SEDATION: None FLUOROSCOPY TIME:  Fluoroscopy Time: 0 minutes 6 seconds COMPLICATIONS: None PROCEDURE: Informed written consent was obtained from the patient after a thorough discussion of the procedural risks, benefits and alternatives.  All questions were addressed. Maximal Sterile Barrier Technique was utilized including caps, mask, sterile gowns, sterile gloves, sterile drape, hand hygiene and skin antiseptic. A timeout was performed prior to the initiation of the procedure. Patient was position in the supine position on the fluoroscopy table with the right arm abducted 90 degrees. Ultrasound survey of the upper extremity was performed with images stored and sent to PACs. The right brachial vein was selected for access. Once the patient was prepped and draped in the usual sterile fashion, the skin and subcutaneous tissues were generously infiltrated with 1% lidocaine for local anesthesia. A micropuncture access kit was then used to access the targeted vein. Wire was passed centrally, confirmed to be within the venous system under fluoroscopy. A small stab incision was made with an 11 blade scalpel and the sheath was then placed over the wire. Estimated length of the catheter was then performed with the indwelling wire. Catheter was amputated at 41 cm length and placed with coaxial wire through the peel-away. Double-lumen, power injectable PICC in the brachial vein. Tip confirmed at the cavoatrial junction, and the catheter is ready for use. Stat lock was placed. Patient tolerated the procedure well and remained hemodynamically stable throughout. No complications were encountered and no significant blood loss was encountered. IMPRESSION: Status post right brachial vein PICC.  Catheter ready for use. Signed, Dulcy Fanny. [name], DO [facility] Electronically Signed   By: Corrie Mckusick D.O.   On: 02/11/2017 13:10     Telemetry    Overnight with Afib; now NSR with PACs - Personally Reviewed  ECG    02/13/17: sinus rhythm with PACs - Personally Reviewed   Cardiac Studies   EKG 02/11/17: Afib/flutter ventricular rate 130  Echocardiogram 02/12/17:  Study Conclusions - Left  ventricle: The cavity size was severely dilated. Wall thickness was normal. Systolic function was severely reduced. The estimated ejection fraction was in the range of 25% to 30%. Diffuse hypokinesis. The study is not technically sufficient to allow evaluation of LV diastolic function. - Mitral valve: There was mild regurgitation. - Left atrium: The atrium was moderately dilated. - Right atrium: The atrium was mildly dilated.  Left heart cath 01/24/16: 1. Angiographically normal coronary arteries 2. The left ventricular systolic function is normal. Essentially normal coronary arteries with no significant coronary disease noted to explain abnormal stress test.  Suspect breast attenuation is the explanation for the false-positive stress test. I suspect noncardiac chest pain  Myoview 01/23/16: 1. Positive for moderate size region of reversible ischemia in the anteroseptal wall of the mid ventricle extending through the apex to the true apex. 2. Normal left ventricular wall motion. 3. Left ventricular ejection fraction 54% 4. Non invasive risk stratification*: Intermediate  Echocardiogram 09/13/15: Study Conclusions - Left ventricle: The cavity size was normal. There was  mild focal basal hypertrophy of the septum. Systolic function was normal. The estimated ejection fraction was in the range of 55% to 60%. Wall motion was normal; there were no regional wall motion abnormalities. Global longitudinal strain: -18.4% There was no evidence of elevated ventricular filling pressure by Doppler parameters.   Patient Profile     46 y.o. female has a PMH significant for breast cancer with lung/bone mets s/p chemo and radiation, asthma, and GERD. She presented to [facility] from interventional radiology department (port-a-cath insertion aborted) with atrial fibrillation RVR.  Assessment & Plan    1. Atrial fibrillation This patients CHA2DS2-VASc Score and unadjusted Ischemic  Stroke Rate (% per year) is equal to 2.2 % stroke rate/year from a score of 2 (CHF, female). Xarelto started yesterday.  - diltiazem and lopressor were D/C'ed - PO amiodarone load was started - 200 mg BID - QTc on EKG today 435. - telemetry with Afib overnight, now with NSR with PACs   2. New onset systolic heart failure - Echo today with LVEF 25-30%, which is new since Jan 2017 echo. This new LV dysfunction is likely multifactorial, related to chemotherapy agents and possible long-standing Afib with RVR.   3. Elevated troponin - 0.03 -->  >0.03 - mildly elevated likely related to RVR.    Signed, Tami Lin Duke , PA-C 10:29 AM 02/13/2017 Pager: [phone]

## 2017-02-14 DIAGNOSIS — I4891 Unspecified atrial fibrillation: Secondary | ICD-10-CM | POA: Diagnosis not present

## 2017-02-14 DIAGNOSIS — C7951 Secondary malignant neoplasm of bone: Secondary | ICD-10-CM | POA: Diagnosis not present

## 2017-02-14 DIAGNOSIS — I420 Dilated cardiomyopathy: Secondary | ICD-10-CM | POA: Diagnosis not present

## 2017-02-14 DIAGNOSIS — C7801 Secondary malignant neoplasm of right lung: Secondary | ICD-10-CM | POA: Diagnosis not present

## 2017-02-14 DIAGNOSIS — C7802 Secondary malignant neoplasm of left lung: Secondary | ICD-10-CM | POA: Diagnosis not present

## 2017-02-14 DIAGNOSIS — D630 Anemia in neoplastic disease: Secondary | ICD-10-CM | POA: Diagnosis not present

## 2017-02-14 DIAGNOSIS — R072 Precordial pain: Secondary | ICD-10-CM | POA: Diagnosis not present

## 2017-02-14 DIAGNOSIS — C50412 Malignant neoplasm of upper-outer quadrant of left female breast: Secondary | ICD-10-CM | POA: Diagnosis not present

## 2017-02-14 MED ORDER — ONDANSETRON HCL 8 MG PO TABS: 8.0000 mg | ORAL_TABLET | Freq: Two times a day (BID) | ORAL | 1 refills | Status: DC | PRN

## 2017-02-14 MED ORDER — LOSARTAN POTASSIUM 25 MG PO TABS: 25.0000 mg | ORAL_TABLET | Freq: Every day | ORAL | 1 refills | Status: DC

## 2017-02-14 MED ORDER — HEPARIN SOD (PORK) LOCK FLUSH 100 UNIT/ML IV SOLN
250.0000 [IU] | INTRAVENOUS | Status: AC | PRN
  Administered 2017-02-14: 250 [IU]

## 2017-02-14 MED ORDER — RIVAROXABAN 20 MG PO TABS: 20.0000 mg | ORAL_TABLET | Freq: Every day | ORAL | 1 refills | Status: DC

## 2017-02-14 MED ORDER — CARVEDILOL 3.125 MG PO TABS: 3.1250 mg | ORAL_TABLET | Freq: Two times a day (BID) | ORAL | Status: DC

## 2017-02-14 MED ORDER — CARVEDILOL 3.125 MG PO TABS: 3.1250 mg | ORAL_TABLET | Freq: Two times a day (BID) | ORAL | 1 refills | Status: DC

## 2017-02-14 MED ORDER — TRAMADOL HCL 50 MG PO TABS: 50.0000 mg | ORAL_TABLET | Freq: Four times a day (QID) | ORAL | 0 refills | Status: DC | PRN

## 2017-02-14 MED ORDER — AMIODARONE HCL 400 MG PO TABS: 400.0000 mg | ORAL_TABLET | Freq: Two times a day (BID) | ORAL | 1 refills | Status: DC

## 2017-02-14 NOTE — Progress Notes (Signed)
Per pt request Chlor. bath  postponed till later [phone]

## 2017-02-14 NOTE — Discharge Summary (Signed)
Physician Discharge Summary  Cindy Bradshaw CBU:[id number] DOB: 1970-09-20 DOA: 02/11/2017  PCP: [name], MD  Admit date: 02/11/2017 Discharge date: 02/14/2017  Admitted From: home Disposition:  home  Recommendations for Outpatient Follow-up:  1. Follow up with Dr. Lindi Adie as scheduled in 4 days 2. Cardiology will arrange follow up in heart failure clinic in 1 wees 3. Patient has a PICC line in place as port placementin IR had to be stopped due to A fib with [place]: RN Equipment/Devices: PICC line  Discharge Condition: stable CODE STATUS: Full code Diet recommendation: heart healthy, low salt  HPI: Per Dr. Charlies Silvers, Cindy Bradshaw is a 46 y.o. female with medical history significant for upper left breast cancer, initial diagnosis in December 2016, status post neoadjuvant chemotherapy from January 2017 through May 2017, left lumpectomy 02/2016, RT to left breast 03/2016 - 05/2016, recurrence and mets to lungs 11/2016, started xeloda 12/20/2016, under the care of Dr. Lindi Adie. Pt was in radiation office to get PICC line placed and has developed atrial fibrillation. Patient reports very mild chest pain in the midsternal area, intermittent, about 2 out of 10 in intensity, nonradiating. Pain is present at rest. No specific alleviating or aggravating factors. No palpitations or shortness of breath. No other complaints such as abdominal pain, nausea or vomiting. No lightheadedness or dizziness or loss of consciousness. No urinary complaints. ED Course: Patient was hemodynamically stable other than tachycardia 132. Blood work was relatively unremarkable except for hemoglobin of 10.1. The 12-lead EKG was significant for atrial fibrillation and patient was started on Cardizem drip which lowered the heart rate down to 120s. ED physician has consulted cardiology.  Hospital Course: Discharge Diagnoses:  Principal Problem:   Atrial fibrillation with RVR ([place]) Active Problems:  Breast cancer of upper-outer quadrant of left female breast (HCC)   Chest pain   Metastatic breast cancer (HCC)   DCM (dilated cardiomyopathy) (HCC)   A-fib (HCC)   A. fib with RVR -Likely in the setting of new onset systolic CHF, probably nonischemic given normal cardiac catheterization in 2017. Patient started on Amiodarone load as well as Coreg, and she is now maintaining sinus rhythm, asymptomatic, will be discharged home in stable condition. She was started on Xarelto for anticoagulation which she is tolerating well. Given dyspnea she also underwent a VQ scan which was low probability for PE. New onset Systolic CHF -2D echo with ejection fraction 25-30% from prior normal values, likely non ischemic and perhaps due to prior chemotherapy or Xeloda vs tachycardia mediated. D/w cardiology and Dr. Lindi Adie, will d/c Xeloda for now. She was started on Coreg and Losartan and tolerated these well. She does not appear to be fluid overloaded and did not require Lasix. D/w cardiology, this can be addressed next week in heart failure clinic. Patient instructed about daily weights, does not have a scale but will purchase one and a low salt diet.  Atypical chest pain -Patient had a cardiac catheterization about a year ago which was normal -VQ scan negative for PE Metastatic breast cancer to lung -discontinue Xeloda. She has follow up with Dr. Lindi Adie for further treatment.  Chemotherapy related anemia -Hemoglobin stable Elevated troponin -Likely demand the setting of A. fib with RVR, trend not in a pattern consistent with ACS   Discharge Instructions   Allergies as of 02/14/2017      Reactions   Dilaudid [hydromorphone Hcl] Itching   Ivp Dye [iodinated Diagnostic Agents] Itching   Percocet [oxycodone-acetaminophen] Itching  Tape Itching, Rash   Toradol [ketorolac Tromethamine] Itching      Medication List    STOP taking these medications   capecitabine 500 MG tablet Commonly known as:  XELODA     fluconazole 200 MG tablet Commonly known as:  DIFLUCAN   HYDROcodone-acetaminophen 5-325 MG tablet Commonly known as:  NORCO/VICODIN   HYDROcodone-homatropine 5-1.5 MG/5ML syrup Commonly known as:  HYCODAN   hydrOXYzine 25 MG capsule Commonly known as:  VISTARIL   lidocaine-prilocaine cream Commonly known as:  EMLA   oxyCODONE-acetaminophen 5-325 MG tablet Commonly known as:  PERCOCET   predniSONE 50 MG tablet Commonly known as:  DELTASONE   prochlorperazine 10 MG tablet Commonly known as:  COMPAZINE     TAKE these medications   albuterol (2.5 MG/3ML) 0.083% nebulizer solution Commonly known as:  PROVENTIL Take 2.5 mg by nebulization every 6 (six) hours as needed for wheezing or shortness of breath.   albuterol 108 (90 Base) MCG/ACT inhaler Commonly known as:  PROVENTIL HFA;VENTOLIN HFA Inhale 1-2 puffs into the lungs every 6 (six) hours as needed for wheezing or shortness of breath.   amiodarone 400 MG tablet Commonly known as:  PACERONE Take 1 tablet (400 mg total) by mouth 2 (two) times daily. 400 mg twice daily for 7 days then 200 mg twice daily for 14 days then 200 mg daily.   carvedilol 3.125 MG tablet Commonly known as:  COREG Take 1 tablet (3.125 mg total) by mouth 2 (two) times daily with a meal.   dexamethasone 4 MG tablet Commonly known as:  DECADRON Take 1 tablet (4 mg total) by mouth daily.   fexofenadine 180 MG tablet Commonly known as:  ALLEGRA Take 180 mg by mouth 2 (two) times daily.   losartan 25 MG tablet Commonly known as:  COZAAR Take 1 tablet (25 mg total) by mouth daily. Start taking on:  02/15/2017   Melatonin 10 MG Tabs Take 10 mg by mouth at bedtime.   ondansetron 8 MG tablet Commonly known as:  ZOFRAN Take 1 tablet (8 mg total) by mouth 2 (two) times daily as needed (Nausea or vomiting).   rivaroxaban 20 MG Tabs tablet Commonly known as:  XARELTO Take 1 tablet (20 mg total) by mouth daily with supper.   traMADol 50 MG  tablet Commonly known as:  ULTRAM Take 1 tablet (50 mg total) by mouth every 6 (six) hours as needed for moderate pain (or Headache unrelieved by tylenol).      Follow-up Information    [facility] Follow up on 02/21/2017.   Specialty:  Cardiology Why:  3:20pm for hospital follow up Contact information: [id number] [place] [address] [phone]         Allergies  Allergen Reactions  . Dilaudid [Hydromorphone Hcl] Itching  . Ivp Dye [Iodinated Diagnostic Agents] Itching  . Percocet [Oxycodone-Acetaminophen] Itching  . Tape Itching and Rash  . Toradol [Ketorolac Tromethamine] Itching    Consultations: Cardiology   Procedures/Studies:  2D echo  Study Conclusions - Left ventricle: The cavity size was severely dilated. Wall thickness was normal. Systolic function was severely reduced. The estimated ejection fraction was in the range of 25% to 30%. Diffuse hypokinesis. The study is not technically sufficient to allow evaluation of LV diastolic function. - Mitral valve: There was mild regurgitation. - Left atrium: The atrium was moderately dilated. - Right atrium: The atrium was mildly dilated.  Dg Chest 2 View  Result Date: 02/12/2017 CLINICAL DATA:  Chest x-ray to cup the ventilation and perfusion lung scan today. EXAM: CHEST  2 VIEW COMPARISON:  Chest x-ray of Jan 24, 2017 FINDINGS: The lungs are borderline hypoinflated. There patchy alveolar opacities in both lungs not greatly changed from the previous study. The cardiac silhouette is enlarged. The AP window region is prominent. The PICC line tip projects over the midportion of the SVC. The bony thorax is unremarkable. IMPRESSION: Persistent bilateral alveolar opacities consistent known metastatic disease. Cardiomegaly without pulmonary edema. Soft tissue fullness in the AP window region consistent with known lymphadenopathy. Electronically  Signed   By: David  Martinique M.D.   On: 02/12/2017 11:46   Dg Chest 2 View  Result Date: 01/24/2017 CLINICAL DATA:  46 year old female with metastatic breast cancer. Shortness of breath for 4 days. EXAM: CHEST  2 VIEW COMPARISON:  PET-CT 12/04/2016, chest radiographs 11/22/2016, and earlier FINDINGS: Multiple rounded nodular bilateral mid and lower lung pulmonary metastases appear larger and denser than on 11/22/2016. Associated worsening lung base ventilation Stable lung volumes. Mild interval hilar enlargement. Other mediastinal contours remain stable. Visualized tracheal air column is within normal limits. No pneumothorax or layering pleural effusion. There does appear to be trace pleural fluid in the fissures which has not definitely changed. No definite consolidation. Stable visualized osseous structures. Stable cholecystectomy clips. Negative visible bowel gas pattern. Stable surgical clips at the left axilla and chest wall. IMPRESSION: 1. Radiographically progressed bilateral mid and lower lung metastases since March with associated worsening lung base ventilation. 2. No drainable pleural fluid or other acute cardiopulmonary abnormality identified. Electronically Signed   By: Genevie Ann M.D.   On: 01/24/2017 14:25   Ct Chest W Contrast  Result Date: 01/30/2017 CLINICAL DATA:  Metastatic left breast cancer EXAM: CT CHEST, ABDOMEN, AND PELVIS WITH CONTRAST TECHNIQUE: Multidetector CT imaging of the chest, abdomen and pelvis was performed following the standard protocol during bolus administration of intravenous contrast. CONTRAST:  144m ISOVUE-300 IOPAMIDOL (ISOVUE-300) INJECTION 61% COMPARISON:  12/04/2016 FINDINGS: CT CHEST FINDINGS Cardiovascular: No acute aortic findings. Extrinsic mass effect on portions of the pulmonary arterial tree due to extensive hilar and mediastinal adenopathy. Mediastinum/Nodes: Extensive than worsened prevascular, AP window, paratracheal, hilar, infrahilar, and internal  mammary adenopathy. Conglomerate AP window adenopathy measures 3.6 cm in short axis, previously 2.8 cm. A subcarinal node measures 2.6 cm in short axis, previously 2.2 cm. The size comparisons are made to the prior PET-CT from 12/04/2016. Prior left axillary dissection. Lungs/Pleura: Compared to the prior PET-CT, there is increase in size and number of scattered pulmonary nodules and masses. For example, a pleural-based mass along the lingula measures 1.9 by 2.2 cm, previously 1.5 by 1.0 cm. A masslike density along the right hemidiaphragm measures 8.0 by 5.5 cm on image 93/4, previously 3.0 by 2.3 cm. One example of a new nodule is the 5 mm pleural-based nodule on image 42 series 4 along the right upper lobe. There many an additional new pulmonary nodules. Trace bilateral pleural effusions with pleural masses favoring malignant effusions. Musculoskeletal: New and enlarging nodules and masses along the right breast. A right inferior breast mass measures 4.3 by 3.8 cm on image 42/2 and was previously 1.4 cm in diameter. A medial mass in the right breast measures 2.8 by 1.7 cm on image 32/2 and was previously 0.8 cm in diameter. A new nodule centrally in the right breast measures 1.0 cm in diameter on image 21/2. Other breast and subcutaneous nodules are present  on the right. Thoracic spondylosis. CT ABDOMEN PELVIS FINDINGS Hepatobiliary: Mild intrahepatic biliary dilatation. Common bile duct measures 1.2 cm in diameter. No focal hepatic lesion identified. Pancreas: Unremarkable Spleen: Unremarkable Adrenals/Urinary Tract: Unremarkable Stomach/Bowel: Prominent stool throughout the colon favors constipation. Vascular/Lymphatic: Unremarkable Reproductive: Unremarkable Other: Scattered new subcutaneous nodules along the abdomen are worrisome for subcutaneous deposits of tumor. An index lesion measures 1.5 by 0.9 cm on image 73 of series 2. These are new compared to the prior exam. Musculoskeletal: Lumbar spondylosis and  degenerative disc disease with right foraminal impingement at L5-S1 due to spurring, image 93/6. Transitional S1 vertebra. IMPRESSION: 1. Worsened tumor burden with significant abnormal nodal enlargement in the chest; new and enlarged pulmonary nodules and masses; new and enlarged right breast and subcutaneous masses and nodules, compatible with metastatic disease. 2. Trace bilateral pleural effusions may be malignant given the visible pleural nodules. 3.  Prominent stool throughout the colon favors constipation. 4. Mild biliary dilatation, some of which may be a physiologic response to cholecystectomy. 5. Right foraminal impingement at L5-S1 due to spurring. Electronically Signed   By: Van Clines M.D.   On: 01/30/2017 15:18   Ct Abdomen Pelvis W Contrast  Result Date: 01/30/2017 CLINICAL DATA:  Metastatic left breast cancer EXAM: CT CHEST, ABDOMEN, AND PELVIS WITH CONTRAST TECHNIQUE: Multidetector CT imaging of the chest, abdomen and pelvis was performed following the standard protocol during bolus administration of intravenous contrast. CONTRAST:  131m ISOVUE-300 IOPAMIDOL (ISOVUE-300) INJECTION 61% COMPARISON:  12/04/2016 FINDINGS: CT CHEST FINDINGS Cardiovascular: No acute aortic findings. Extrinsic mass effect on portions of the pulmonary arterial tree due to extensive hilar and mediastinal adenopathy. Mediastinum/Nodes: Extensive than worsened prevascular, AP window, paratracheal, hilar, infrahilar, and internal mammary adenopathy. Conglomerate AP window adenopathy measures 3.6 cm in short axis, previously 2.8 cm. A subcarinal node measures 2.6 cm in short axis, previously 2.2 cm. The size comparisons are made to the prior PET-CT from 12/04/2016. Prior left axillary dissection. Lungs/Pleura: Compared to the prior PET-CT, there is increase in size and number of scattered pulmonary nodules and masses. For example, a pleural-based mass along the lingula measures 1.9 by 2.2 cm, previously 1.5 by 1.0  cm. A masslike density along the right hemidiaphragm measures 8.0 by 5.5 cm on image 93/4, previously 3.0 by 2.3 cm. One example of a new nodule is the 5 mm pleural-based nodule on image 42 series 4 along the right upper lobe. There many an additional new pulmonary nodules. Trace bilateral pleural effusions with pleural masses favoring malignant effusions. Musculoskeletal: New and enlarging nodules and masses along the right breast. A right inferior breast mass measures 4.3 by 3.8 cm on image 42/2 and was previously 1.4 cm in diameter. A medial mass in the right breast measures 2.8 by 1.7 cm on image 32/2 and was previously 0.8 cm in diameter. A new nodule centrally in the right breast measures 1.0 cm in diameter on image 21/2. Other breast and subcutaneous nodules are present on the right. Thoracic spondylosis. CT ABDOMEN PELVIS FINDINGS Hepatobiliary: Mild intrahepatic biliary dilatation. Common bile duct measures 1.2 cm in diameter. No focal hepatic lesion identified. Pancreas: Unremarkable Spleen: Unremarkable Adrenals/Urinary Tract: Unremarkable Stomach/Bowel: Prominent stool throughout the colon favors constipation. Vascular/Lymphatic: Unremarkable Reproductive: Unremarkable Other: Scattered new subcutaneous nodules along the abdomen are worrisome for subcutaneous deposits of tumor. An index lesion measures 1.5 by 0.9 cm on image 73 of series 2. These are new compared to the prior exam. Musculoskeletal: Lumbar spondylosis and degenerative disc disease  with right foraminal impingement at L5-S1 due to spurring, image 93/6. Transitional S1 vertebra. IMPRESSION: 1. Worsened tumor burden with significant abnormal nodal enlargement in the chest; new and enlarged pulmonary nodules and masses; new and enlarged right breast and subcutaneous masses and nodules, compatible with metastatic disease. 2. Trace bilateral pleural effusions may be malignant given the visible pleural nodules. 3.  Prominent stool throughout the  colon favors constipation. 4. Mild biliary dilatation, some of which may be a physiologic response to cholecystectomy. 5. Right foraminal impingement at L5-S1 due to spurring. Electronically Signed   By: Van Clines M.D.   On: 01/30/2017 15:18   Nm Pulmonary Perf And Vent  Result Date: 02/12/2017 CLINICAL DATA:  Metastatic breast cancer and COPD. Short of breath. Irregular heart rate. EXAM: NUCLEAR MEDICINE VENTILATION - PERFUSION LUNG SCAN TECHNIQUE: Ventilation images were obtained in multiple projections using inhaled aerosol Tc-88mDTPA. Perfusion images were obtained in multiple projections after intravenous injection of Tc-933mAA. RADIOPHARMACEUTICALS:  32.4 MCi Technetium-9916mPA aerosol inhalation and 4.3 mCi Technetium-42m44m IV COMPARISON:  PET-CT, 12/04/2016 FINDINGS: Ventilation: There are patchy areas of decreased ventilation in both lungs. Perfusion: There are patchy areas of nonsegmental decreased profusion, which correspond to the areas of decreased ventilation. There are no segmental perfusion defects, mismatched to ventilation, to suggest a pulmonary thromboembolism. IMPRESSION: 1. No evidence of pulmonary thromboembolism. There matched nonsegmental ventilation and perfusion abnormalities which are likely from the known metastatic disease. Electronically Signed   By: DaviLajean Manes.   On: 02/12/2017 11:48   Ir Fluoro Guide Cv Line Right  Result Date: 02/11/2017 INDICATION: 46 y75r old female with a history of metastatic breast carcinoma. She presents for a port catheter placement to initiate chemotherapy. Upon presentation atrial fibrillation with RVR was discovered, which is her first presentation. No history of cardiac problems. Plan is for PICC placement with future port. EXAM: PICC LINE PLACEMENT WITH ULTRASOUND AND FLUOROSCOPIC GUIDANCE MEDICATIONS: None ANESTHESIA/SEDATION: None FLUOROSCOPY TIME:  Fluoroscopy Time: 0 minutes 6 seconds COMPLICATIONS: None PROCEDURE:  Informed written consent was obtained from the patient after a thorough discussion of the procedural risks, benefits and alternatives. All questions were addressed. Maximal Sterile Barrier Technique was utilized including caps, mask, sterile gowns, sterile gloves, sterile drape, hand hygiene and skin antiseptic. A timeout was performed prior to the initiation of the procedure. Patient was position in the supine position on the fluoroscopy table with the right arm abducted 90 degrees. Ultrasound survey of the upper extremity was performed with images stored and sent to PACs. The right brachial vein was selected for access. Once the patient was prepped and draped in the usual sterile fashion, the skin and subcutaneous tissues were generously infiltrated with 1% lidocaine for local anesthesia. A micropuncture access kit was then used to access the targeted vein. Wire was passed centrally, confirmed to be within the venous system under fluoroscopy. A small stab incision was made with an 11 blade scalpel and the sheath was then placed over the wire. Estimated length of the catheter was then performed with the indwelling wire. Catheter was amputated at 41 cm length and placed with coaxial wire through the peel-away. Double-lumen, power injectable PICC in the brachial vein. Tip confirmed at the cavoatrial junction, and the catheter is ready for use. Stat lock was placed. Patient tolerated the procedure well and remained hemodynamically stable throughout. No complications were encountered and no significant blood loss was encountered. IMPRESSION: Status post right brachial vein PICC.  Catheter ready for use. Signed,  Dulcy Fanny. [name], DO [facility] Electronically Signed   By: Corrie Mckusick D.O.   On: 02/11/2017 13:10   Ir US Guide Vasc Access Right  Result Date: 02/11/2017 INDICATION: 46 year old female with a history of metastatic breast carcinoma. She presents  for a port catheter placement to initiate chemotherapy. Upon presentation atrial fibrillation with RVR was discovered, which is her first presentation. No history of cardiac problems. Plan is for PICC placement with future port. EXAM: PICC LINE PLACEMENT WITH ULTRASOUND AND FLUOROSCOPIC GUIDANCE MEDICATIONS: None ANESTHESIA/SEDATION: None FLUOROSCOPY TIME:  Fluoroscopy Time: 0 minutes 6 seconds COMPLICATIONS: None PROCEDURE: Informed written consent was obtained from the patient after a thorough discussion of the procedural risks, benefits and alternatives. All questions were addressed. Maximal Sterile Barrier Technique was utilized including caps, mask, sterile gowns, sterile gloves, sterile drape, hand hygiene and skin antiseptic. A timeout was performed prior to the initiation of the procedure. Patient was position in the supine position on the fluoroscopy table with the right arm abducted 90 degrees. Ultrasound survey of the upper extremity was performed with images stored and sent to PACs. The right brachial vein was selected for access. Once the patient was prepped and draped in the usual sterile fashion, the skin and subcutaneous tissues were generously infiltrated with 1% lidocaine for local anesthesia. A micropuncture access kit was then used to access the targeted vein. Wire was passed centrally, confirmed to be within the venous system under fluoroscopy. A small stab incision was made with an 11 blade scalpel and the sheath was then placed over the wire. Estimated length of the catheter was then performed with the indwelling wire. Catheter was amputated at 41 cm length and placed with coaxial wire through the peel-away. Double-lumen, power injectable PICC in the brachial vein. Tip confirmed at the cavoatrial junction, and the catheter is ready for use. Stat lock was placed. Patient tolerated the procedure well and remained hemodynamically stable throughout. No complications were encountered and no  significant blood loss was encountered. IMPRESSION: Status post right brachial vein PICC.  Catheter ready for use. Signed, Dulcy Fanny. [name], DO [facility] Electronically Signed   By: Corrie Mckusick D.O.   On: 02/11/2017 13:10     Subjective: - no chest pain, shortness of breath, no abdominal pain, nausea or vomiting.   Discharge Exam: Vitals:   02/14/17 0925 02/14/17 1428  BP: (!) 143/61 121/66  Pulse: (!) 103 99  Resp: 18 16  Temp:  98.6 F (37 C)   Vitals:   02/13/17 2058 02/14/17 0640 02/14/17 0925 02/14/17 1428  BP: (!) 146/59 120/69 (!) 143/61 121/66  Pulse: (!) 104 91 (!) 103 99  Resp: _0 Temp: 98.1 F (36.7 C) 98.8 F (37.1 C)  98.6 F (37 C)  TempSrc: Oral Oral  Oral  SpO2: 99% 98% 97% 98%  Weight:  91.6 kg (201 lb 15.1 oz)    Height:        General: Pt is alert, awake, not in acute distress Cardiovascular: RRR, S1/S2 +, no rubs, no gallops Respiratory: CTA bilaterally, no wheezing, no rhonchi Abdominal: Soft, NT, ND, bowel sounds + Extremities: no edema, no cyanosis    The results of significant diagnostics from this hospitalization (including imaging, microbiology, ancillary and laboratory) are listed below for reference.     Microbiology: Recent Results (from the past 240 hour(s))  MRSA PCR Screening     Status: None  Collection Time: 02/11/17  4:17 PM  Result Value Ref Range Status   MRSA by PCR NEGATIVE NEGATIVE Final    Comment:        The GeneXpert MRSA Assay (FDA approved for NASAL specimens only), is one component of a comprehensive MRSA colonization surveillance program. It is not intended to diagnose MRSA infection nor to guide or monitor treatment for MRSA infections.      Labs: BNP (last 3 results)  Recent Labs  03/15/16 1411 09/20/16 1858  BNP 20.2 17.4   Basic Metabolic Panel:  Recent Labs Lab 02/11/17 1339 02/12/17 0350 02/13/17 0607  NA 133* 136  137  K 4.3 4.0 4.5  CL 100* 103 105  CO2 _0 GLUCOSE 99 117* 114*  BUN 25* 28* 24*  CREATININE 0.62 0.67 0.61  CALCIUM 10.3 9.1 10.0   Liver Function Tests: No results for input(s): AST, ALT, ALKPHOS, BILITOT, PROT, ALBUMIN in the last 168 hours. No results for input(s): LIPASE, AMYLASE in the last 168 hours. No results for input(s): AMMONIA in the last 168 hours. CBC:  Recent Labs Lab 02/11/17 1015 02/11/17 1339 02/12/17 0350 02/13/17 0607  WBC 7.2 6.4 5.1 6.3  NEUTROABS  --  4.3  --   --   HGB 10.1* 10.1* 9.5* 10.3*  HCT 31.6* 31.7* 30.2* 32.6*  MCV 86.3 86.1 87.8 86.5  PLT 455* 409* 363 380   Cardiac Enzymes:  Recent Labs Lab 02/11/17 1925 02/12/17 0313 02/12/17 1353  TROPONINI 0.03* <0.03 <0.03   BNP: Invalid input(s): POCBNP CBG: No results for input(s): GLUCAP in the last 168 hours. D-Dimer No results for input(s): DDIMER in the last 72 hours. Hgb A1c No results for input(s): HGBA1C in the last 72 hours. Lipid Profile No results for input(s): CHOL, HDL, LDLCALC, TRIG, CHOLHDL, LDLDIRECT in the last 72 hours. Thyroid function studies No results for input(s): TSH, T4TOTAL, T3FREE, THYROIDAB in the last 72 hours.  Invalid input(s): FREET3 Anemia work up No results for input(s): VITAMINB12, FOLATE, FERRITIN, TIBC, IRON, RETICCTPCT in the last 72 hours. Urinalysis    Component Value Date/Time   COLORURINE YELLOW 07/03/2016 0557   APPEARANCEUR CLOUDY (A) 07/03/2016 0557   LABSPEC 1.021 07/03/2016 0557   LABSPEC 1.010 04/10/2016 1224   PHURINE 6.0 07/03/2016 0557   GLUCOSEU NEGATIVE 07/03/2016 0557   GLUCOSEU Negative 04/10/2016 1224   HGBUR SMALL (A) 07/03/2016 0557   BILIRUBINUR NEGATIVE 07/03/2016 0557   BILIRUBINUR Negative 04/10/2016 1224   KETONESUR NEGATIVE 07/03/2016 0557   PROTEINUR NEGATIVE 07/03/2016 0557   UROBILINOGEN 0.2 04/10/2016 1224   NITRITE NEGATIVE 07/03/2016 0557   LEUKOCYTESUR NEGATIVE 07/03/2016 0557   LEUKOCYTESUR  Negative 04/10/2016 1224   Sepsis Labs Invalid input(s): PROCALCITONIN,  WBC,  LACTICIDVEN Microbiology Recent Results (from the past 240 hour(s))  MRSA PCR Screening     Status: None   Collection Time: 02/11/17  4:17 PM  Result Value Ref Range Status   MRSA by PCR NEGATIVE NEGATIVE Final    Comment:        The GeneXpert MRSA Assay (FDA approved for NASAL specimens only), is one component of a comprehensive MRSA colonization surveillance program. It is not intended to diagnose MRSA infection nor to guide or monitor treatment for MRSA infections.      Time coordinating discharge: 35 minutes  SIGNED:  Marzetta Board, MD  [facility] 02/14/2017, 2:39 PM Pager [phone]  If 7PM-7AM, please contact night-coverage [email or web address] Password [email or web address]

## 2017-02-14 NOTE — Progress Notes (Signed)
Progress Note  Patient Name: Cindy Bradshaw Date of Encounter: 02/14/2017  Primary Cardiologist: Dr. Johnsie Cancel  Subjective   Pt reports a brief bout of chest pain last night that started substernal and wrapped around under her right breast. She vomited once and felt some relief. She states she wants to discharge home.  Inpatient Medications    Scheduled Meds: . amiodarone  400 mg Oral BID  . Chlorhexidine Gluconate Cloth  6 each Topical Daily  . dexamethasone  4 mg Oral Daily  . losartan  25 mg Oral Daily  . rivaroxaban  20 mg Oral Q supper   Continuous Infusions:  PRN Meds: acetaminophen, albuterol, morphine injection, ondansetron (ZOFRAN) IV, sodium chloride flush, technetium TC 59M diethylenetriame-pentaacetic acid, traMADol   Vital Signs    Vitals:   02/13/17 1700 02/13/17 1821 02/13/17 2058 02/14/17 0640  BP:  (!) 157/74 (!) 146/59 120/69  Pulse:  (!) 112 (!) 104 91  Resp: (!) 27 (!) 22 19 20   Temp:  98 F (36.7 C) 98.1 F (36.7 C) 98.8 F (37.1 C)  TempSrc:  Oral Oral Oral  SpO2:  100% 99% 98%  Weight:    201 lb 15.1 oz (91.6 kg)  Height:        Intake/Output Summary (Last 24 hours) at 02/14/17 0818 Last data filed at 02/14/17 0028  Gross per 24 hour  Intake                0 ml  Output              480 ml  Net             -480 ml   Filed Weights   02/12/17 0323 02/13/17 0500 02/14/17 0640  Weight: 206 lb 9.1 oz (93.7 kg) 206 lb 12.7 oz (93.8 kg) 201 lb 15.1 oz (91.6 kg)     Physical Exam   General: Well developed, well nourished, female appearing in no acute distress. Head: Normocephalic, atraumatic.  Neck: Supple without bruits,  No JVD Lungs:  Resp regular and unlabored, CTA. Heart: RRR, S1, S2, no murmur; no rub. Abdomen: Soft, non-tender, non-distended with normoactive bowel sounds. No hepatomegaly. No rebound/guarding. No obvious abdominal masses. Extremities: No clubbing, cyanosis, trace edema. Distal pedal pulses are 2+ bilaterally. Neuro:  Alert and oriented X 3. Moves all extremities spontaneously. Psych: Normal affect.  Labs    Chemistry Recent Labs Lab 02/11/17 1339 02/12/17 0350 02/13/17 0607  NA 133* 136 137  K 4.3 4.0 4.5  CL 100* 103 105  CO2 27 26 27   GLUCOSE 99 117* 114*  BUN 25* 28* 24*  CREATININE 0.62 0.67 0.61  CALCIUM 10.3 9.1 10.0  GFRNONAA >60 >60 >60  GFRAA >60 >60 >60  ANIONGAP 6 7 5      Hematology Recent Labs Lab 02/11/17 1339 02/12/17 0350 02/13/17 0607  WBC 6.4 5.1 6.3  RBC 3.68* 3.44* 3.77*  HGB 10.1* 9.5* 10.3*  HCT 31.7* 30.2* 32.6*  MCV 86.1 87.8 86.5  MCH 27.4 27.6 27.3  MCHC 31.9 31.5 31.6  RDW 17.6* 18.1* 18.5*  PLT 409* 363 380    Cardiac Enzymes Recent Labs Lab 02/11/17 1925 02/12/17 0313 02/12/17 1353  TROPONINI 0.03* <0.03 <0.03    Recent Labs Lab 02/11/17 1352  TROPIPOC 0.02     BNPNo results for input(s): BNP, PROBNP in the last 168 hours.   DDimer No results for input(s): DDIMER in the last 168 hours.   Radiology  Dg Chest 2 View  Result Date: 02/12/2017 CLINICAL DATA:  Chest x-ray to cup the ventilation and perfusion lung scan today. EXAM: CHEST  2 VIEW COMPARISON:  Chest x-ray of Jan 24, 2017 FINDINGS: The lungs are borderline hypoinflated. There patchy alveolar opacities in both lungs not greatly changed from the previous study. The cardiac silhouette is enlarged. The AP window region is prominent. The PICC line tip projects over the midportion of the SVC. The bony thorax is unremarkable. IMPRESSION: Persistent bilateral alveolar opacities consistent known metastatic disease. Cardiomegaly without pulmonary edema. Soft tissue fullness in the AP window region consistent with known lymphadenopathy. Electronically Signed   By: David  Martinique M.D.   On: 02/12/2017 11:46   Nm Pulmonary Perf And Vent  Result Date: 02/12/2017 CLINICAL DATA:  Metastatic breast cancer and COPD. Short of breath. Irregular heart rate. EXAM: NUCLEAR MEDICINE VENTILATION -  PERFUSION LUNG SCAN TECHNIQUE: Ventilation images were obtained in multiple projections using inhaled aerosol Tc-54m DTPA. Perfusion images were obtained in multiple projections after intravenous injection of Tc-81m MAA. RADIOPHARMACEUTICALS:  32.4 MCi Technetium-20m DTPA aerosol inhalation and 4.3 mCi Technetium-74m MAA IV COMPARISON:  PET-CT, 12/04/2016 FINDINGS: Ventilation: There are patchy areas of decreased ventilation in both lungs. Perfusion: There are patchy areas of nonsegmental decreased profusion, which correspond to the areas of decreased ventilation. There are no segmental perfusion defects, mismatched to ventilation, to suggest a pulmonary thromboembolism. IMPRESSION: 1. No evidence of pulmonary thromboembolism. There matched nonsegmental ventilation and perfusion abnormalities which are likely from the known metastatic disease. Electronically Signed   By: Lajean Manes M.D.   On: 02/12/2017 11:48     Telemetry    NSR in the 70s, bout of Afib./flutter vs sinus tachycardia overnight - Personally Reviewed  ECG    02/14/17: NSR - Personally Reviewed   Cardiac Studies   Echocardiogram 02/12/17: Study Conclusions - Left ventricle: The cavity size was severely dilated. Wall thickness was normal. Systolic function was severely reduced. The estimated ejection fraction was in the range of 25% to 30%. Diffuse hypokinesis. The study is not technically sufficient to allow evaluation of LV diastolic function. - Mitral valve: There was mild regurgitation. - Left atrium: The atrium was moderately dilated. - Right atrium: The atrium was mildly dilated.  Left heart cath 01/24/16: 1. Angiographically normal coronary arteries 2. The left ventricular systolic function is normal. Essentially normal coronary arteries with no significant coronary disease noted to explain abnormal stress test.  Suspect breast attenuation is the explanation for the false-positive stress test. I suspect  noncardiac chest pain  Myoview 01/23/16: 1. Positive for moderate size region of reversible ischemia in the anteroseptal wall of the mid ventricle extending through the apex to the true apex. 2. Normal left ventricular wall motion. 3. Left ventricular ejection fraction 54% 4. Non invasive risk stratification*: Intermediate  Echocardiogram 09/13/15: Study Conclusions - Left ventricle: The cavity size was normal. There was mild focal basal hypertrophy of the septum. Systolic function was normal. The estimated ejection fraction was in the range of 55% to 60%. Wall motion was normal; there were no regional wall motion abnormalities. Global longitudinal strain: -18.4% There was no evidence of elevated ventricular filling pressure by Doppler parameters.   Patient Profile     46 y.o. female with a PMH significant for breast cancer with lung/bone mets s/p chemo and radiation, asthma, and GERD. She presented to [facility] from interventional radiologydepartment (port-a-cath insertion aborted) with atrial fibrillation RVR.  Assessment & Plan    1.  Atrial fibrillation This patients CHA2DS2-VASc Score and unadjusted Ischemic Stroke Rate (% per year) is equal to 2.2 % stroke rate/year from a score of 2(CHF, female). Xarelto started yesterday.  - diltiazem and lopressor were D/C'ed - PO amiodarone load was started at 200 mg BID and increased yesterday to 400 mg BID - QTc on EKG today 437 - telemetry with one bout of fib/flutter in the 100s overnight that spontaneously converted to NSR  2. New onset systolic heart failure - Echo with LVEF 25-30%, which is new since Jan 2017 echo. This new LV dysfunction is likely multifactorial, related to chemotherapy agents and possible long-standing Afib with RVR.  3. Elevated troponin - 0.03 -->>0.03 - mildly elevated likely related to demand ischemia from afib with RVR.  - she had normal coronary arteries by cath a year ago  Signed, Courtney Paris 8:18 AM 02/14/2017 Pager: [phone]  Patient seen and independently examined with Fabian Sharp, PA. We discussed all aspects of the encounter. I agree with the assessment and plan as stated above.  Patient tolerating Amio load and has only had 1 bout of afib overnight that spontaneously converted to NSR. She also had an episode of sharp CP with vomiting that improved after emesis and pain meds.  The pain was sharp and likely GERD.  She denies any nausea since starting Amio.  Her VS are stable except for mildly tachycardic.  She is tolerating losartan 25mg  daily.  Will add carvedilol 3.125mg  BID for LV dysfunction and tachycardia.  She really wants to go home and I think she is fine to do this.  Would recommend Amio 400mg  BID for 1 week then 200mg  BID for 2 weeks then 200mg  daily.  I will set her up to be seen in [facility] next week for followup.  She will need an EKG at that time as well.  Will continue to titrate HF meds as outpt.    Signed: Fransico Him, MD [facility] 02/14/2017

## 2017-02-14 NOTE — Progress Notes (Signed)
IV team in w/ pt.CM to see.Awaiting ride.

## 2017-02-14 NOTE — Progress Notes (Signed)
D/c to home w/ dtr ,All d/c instructions given w/ verbal understanding,Infusion nurse verified all is set up w/ [facility] center and care of PICC line.

## 2017-02-16 NOTE — Progress Notes (Addendum)
[place] [facility] up:    [name], MD [address] [place] [address]   DIAGNOSIS: Cancer Staging Breast cancer of upper-outer quadrant of left female breast [facility]) Staging form: Breast, AJCC 7th Edition - Pathologic stage from 02/13/2016: Stage IIA (yT2, N0, cM0) - Signed by Holley Bouche, NP on 07/17/2016   SUMMARY OF ONCOLOGIC HISTORY:   Breast cancer of upper-outer quadrant of left female breast ([place])   08/17/2015 Initial Diagnosis    left breast biopsy 1:30 position: Invasive ductal carcinoma, grade 3, ER 0%, PR 0%, HER-2 negative ratio 1.48, Ki-67 90%, 3.1 cm tumor, axilla negative, T2 N0 stage II a clinical stage      09/19/2015 - 01/03/2016 Neo-Adjuvant Chemotherapy    Neoadjuvant chemotherapy with dose dense Adriamycin and Cytoxan 4 followed by Carbo/Taxol x 3 cycles (Carbo d/c'd after cycle #3 d/t hospitalization for N&V/dehydration). Went on to complete additional 3 cycles of Taxol alone.       09/28/2015 Procedure     Left breast biopsy anterior third left upper outer quadrant: PASH;  upper inner quadrant: [place]      10/06/2015 Procedure    Genetic testing: Negative. Genes analyzed: ATM, BARD1, BRCA1, BRCA2, BRIP1, CDH1, CHEK2, FANCC, MLH1, MSH2, MSH6, NBN, PALB2, PMS2, PTEN, RAD51C, RAD51D, TP53, and XRCC2.  This panel also includes deletion/duplication analysis (without sequencing) for one gene, EPCAM      01/16/2016 Breast MRI    significant response to chemotherapy with decrease in the tumor from 5.1 cm to 2.9 cm, also decrease a non-mass enhancement      02/13/2016 Surgery    Left lumpectomy ([place]): IDC grade 3, 3.3 cm, margins negative, 0/3 lymph nodes negative, triple negative, T2 N0 stage II a pathologic stage      02/21/2016 Surgery    Bilateral breast mammoplasty ([place]); no malignancy on path      03/26/2016 - 05/10/2016 Radiation Therapy    Adjuvant radiation therapy Lisbeth Renshaw). Left breast: 50.4 Gy  in 28 fractions. Left breast boost: 10 Gy in 5 fractions.       11/13/2016 Imaging    CT chest: Extensive adenopathy throughout anterior mediastinum largest 2.6 cm,Ln at aortic arch 2.8 cm, subcarinal LN 2.8 cm, left hilar LN 1.6 cm, right hilar LN 1.8 cm, lung nodules bilaterally largest right lung 2.6 cm      11/20/2016 Relapse/Recurrence    Lung biopsy left lower quadrant: Metastatic carcinoma consistent with breast      12/04/2016 PET scan    Multiple bilateral lung nodules or masses, 12 mm LUL, to 3 cm masses RML, 2.3 cm posterior left lung base, extensive thoracic lymphadenopathy 2.9 cm prevascular, 2.2 cm subcarinal, 2 cm left hilar, 1.5 cm right infrahilar, 1.5 cm right IMA , additional bone metastases right acetabulum      12/20/2016 -  Chemotherapy    Xeloda 1500 mg by mouth twice a day 2 weeks on one week off        CURRENT THERAPY: Eribulin given days 1 and days 8 on a 21 day cycle  INTERVAL HISTORY: Cindy Bradshaw 46 y.o. female returns for evaluation prior to receiving Eribulin.  She went to get a port and was found to be in Atrial Fibrilliation with RVR.  She was admitted to the hospital for evaluation.  She is not feeling well today due to difficulty falling asleep.  She has no orthopnea, just cannot fall asleep at night.  She does have an  elevated heart rate today.  She has new onset Systolic CHF with echo of 25-30%.  She was started on Amiodarone and is currently on a taper, right now she is at 470m po BID.  She is also tolerating Carvedilol well.  Her eyes have been bothering her.  They are red, injected, +foreign body sensation.  No drainage, but increased photophobia.  She feels like something is behind the lids of her eyes.  This has happened to her once before and it improved after receiving some eye drops.      Patient Active Problem List   Diagnosis Date Noted  . A-fib ([place] 02/13/2017  . Metastatic breast cancer ([place]   . DCM (dilated cardiomyopathy) ([place]   .  Atrial fibrillation with RVR ([place] 02/11/2017  . Chest pain   . Adnexal cyst: Right per CT 12/09/2015 12/12/2015  . Chemotherapy-induced peripheral neuropathy ([facility] 12/05/2015  . Normocytic normochromic anemia 09/19/2015  . Breast cancer of upper-outer quadrant of left female breast ([facility] 09/07/2015    is allergic to dilaudid [hydromorphone hcl]; ivp dye [iodinated diagnostic agents]; percocet [oxycodone-acetaminophen]; tape; and toradol [ketorolac tromethamine].  MEDICAL HISTORY: Past Medical History:  Diagnosis Date  . Arthritis   . Asthma   . Back disorder    degenerative disk disease  . Breast cancer ([facility]   . Cancer ([facility]   . GERD (gastroesophageal reflux disease)   . Mammogram abnormal 08/24/15   first  . Migraine   . Pap smear for cervical cancer screening 08/12/15    SURGICAL HISTORY: Past Surgical History:  Procedure Laterality Date  . BREAST LUMPECTOMY WITH RADIOACTIVE SEED AND SENTINEL LYMPH NODE BIOPSY Left 02/13/2016   Procedure: BREAST LUMPECTOMY WITH RADIOACTIVE SEED AND SENTINEL LYMPH NODE BIOPSY;  Surgeon: BExcell Seltzer MD;  [place]  Service: General;  Laterality: Left;  . BREAST REDUCTION SURGERY Bilateral 02/21/2016   Procedure: MAMMARY REDUCTION  (BREAST)/Oncoplastic breast reconstruction;  Surgeon: BIrene Limbo MD;  [place] [facility]  Service: Plastics;  Laterality: Bilateral;  . CARDIAC CATHETERIZATION [name] 01/24/2016   Procedure: Left Heart Cath and Coronary Angiography;  Surgeon: DLeonie Man MD;  [facility];  Service: Cardiovascular;  Laterality: [name];  . CESAREAN SECTION    . CHOLECYSTECTOMY    . IR FLUORO GUIDE CV LINE RIGHT  02/11/2017  . IR [place] VASC ACCESS RIGHT  02/11/2017  . PERIPHERAL VASCULAR CATHETERIZATION Right 02/13/2016   Procedure: PORTA CATH REMOVAL;  Surgeon: BExcell Seltzer MD;  [place] [facility]  Service: General;  Laterality: Right;  .  PORTACATH PLACEMENT [name] 09/12/2015   Procedure: INSERTION PORT-A-CATH;  Surgeon: BExcell Seltzer MD;  [facility];  Service: General;  Laterality: [name];    SOCIAL HISTORY: Social History   Social History  . Marital status: Legally Separated    Spouse name: KChrissie Noa . Number of children: 2  . Years of education: [name]   Occupational History  . aWeb designer   Social History Main Topics  . Smoking status: Never Smoker  . Smokeless tobacco: Never Used  . Alcohol use 0.0 oz/week     Comment: occassional glass of wine  . Drug use: No  . Sexual activity: Yes   Other Topics Concern  . Not on file   Social History Narrative   ** Merged History Encounter **       First MP age 46  LMP 08/26/15.     2 children carried to  term.  Age at first live birth 51   H/o oral contrasception use       FAMILY HISTORY: Family History  Problem Relation Age of Onset  . Lung cancer Mother 75       2 different types of lung cancer; metastasis to brain; smoker  . Other Mother        hx of hysterectomy for unspecified reason  . Bone cancer Maternal Uncle        dx. early 32s  . Breast cancer Maternal Grandmother        dx. early 16s, s/p mastectomy  . Cancer Paternal Grandfather        unspecified type of cancer, dx. late 70s  . Congestive Heart Failure Father        smoker  . Stroke Father   . Eczema Father   . Cirrhosis Maternal Grandfather   . Heart Problems Maternal Grandfather   . Asthma Daughter   . Allergies Daughter        hives  . Lung cancer Other        (maternal great uncle; MGM's brother); had a coal stove  . Cancer Cousin        unspecified type; d. early age (paternal first cousin once-removed)  . Cancer Cousin        dx. as a kid; in remission today; (paternal 2nd cousin)  . Cancer Cousin        unspecified type; d. early 88s; (maternal 1st cousin)    Review of Systems  Constitutional: Negative for appetite change, chills, diaphoresis, fatigue,  fever and unexpected weight change.  HENT:   Negative for hearing loss and lump/mass.   Eyes: Positive for eye problems. Negative for icterus.  Respiratory: Negative for chest tightness, cough and shortness of breath.   Cardiovascular: Negative for chest pain, leg swelling and palpitations.  Gastrointestinal: Negative for abdominal distention and abdominal pain.  Endocrine: Negative for hot flashes.  Genitourinary: Negative for difficulty urinating.   Musculoskeletal: Negative for arthralgias.  Skin: Negative for itching and rash.  Neurological: Negative for dizziness, extremity weakness and numbness.  Hematological: Negative for adenopathy. Does not bruise/bleed easily.  Psychiatric/Behavioral: Positive for sleep disturbance. The patient is not nervous/anxious.       PHYSICAL EXAMINATION  ECOG PERFORMANCE STATUS: 2 - Symptomatic, <50% confined to bed  Vitals:   02/18/17 0844  BP: (!) 157/92  Pulse: (!) 121  Resp: 20  Temp: 98.3 F (36.8 C)    Physical Exam  Constitutional: She is oriented to person, place, and time and well-developed, well-nourished, and in no distress.  HENT:  Head: Normocephalic and atraumatic.  Mouth/Throat: No oropharyngeal exudate.  Eyes: Pupils are equal, round, and reactive to light. Right eye exhibits no discharge.  Eyes are injected bilaterally, + light sensitivity  Neck: Neck supple.  Cardiovascular:  Irregularly irregular  Pulmonary/Chest: Effort normal and breath sounds normal. No respiratory distress. She has no wheezes. She has no rales. She exhibits no tenderness.  Abdominal: Soft. Bowel sounds are normal. She exhibits no distension and no mass. There is no tenderness. There is no rebound and no guarding.  Lymphadenopathy:    She has no cervical adenopathy.  Neurological: She is alert and oriented to person, place, and time.  Skin: Skin is warm and dry. No rash noted.  Psychiatric: Mood and affect normal.    LABORATORY DATA:  CBC     Component Value Date/Time   WBC 5.4 02/18/2017 0839   WBC  6.3 02/13/2017 0607   RBC 3.93 02/18/2017 0839   RBC 3.77 (L) 02/13/2017 0607   HGB 11.3 (L) 02/18/2017 0839   HCT 34.7 (L) 02/18/2017 0839   PLT 367 02/18/2017 0839   MCV 88.2 02/18/2017 0839   MCH 28.7 02/18/2017 0839   MCH 27.3 02/13/2017 0607   MCHC 32.5 02/18/2017 0839   MCHC 31.6 02/13/2017 0607   RDW 21.3 (H) 02/18/2017 0839   LYMPHSABS 1.6 02/18/2017 0839   MONOABS 1.1 (H) 02/18/2017 0839   EOSABS 0.1 02/18/2017 0839   BASOSABS 0.0 02/18/2017 0839    CMP     Component Value Date/Time   NA 135 (L) 02/18/2017 0840   K 4.1 02/18/2017 0840   CL 105 02/13/2017 0607   CO2 25 02/18/2017 0840   GLUCOSE 99 02/18/2017 0840   BUN 30.7 (H) 02/18/2017 0840   CREATININE 0.8 02/18/2017 0840   CALCIUM 9.7 02/18/2017 0840   PROT 6.4 02/18/2017 0840   ALBUMIN 2.5 (L) 02/18/2017 0840   AST 48 (H) 02/18/2017 0840   ALT 92 (H) 02/18/2017 0840   ALKPHOS 112 02/18/2017 0840   BILITOT 0.43 02/18/2017 0840   GFRNONAA >60 02/13/2017 0607   GFRAA >60 02/13/2017 0607     ASSESSMENT and PLAN:   Breast cancer of upper-outer quadrant of left female breast ([place]) Left lumpectomy 02/13/2016: IDC grade 3, 3.3 cm, margins negative, 0/3 lymph nodes negative, triple negative, T2 N0 stage II a pathologic stage  Left breast biopsy 08/17/2015:1:30 position: Invasive ductal carcinoma, grade 3, ER 0%, PR 0%, HER-2 negative ratio 1.48, Ki-67 90%, 3.1 cm tumor, axilla negative, T2 N0 stage II a clinical stage Breast biopsies x 2: 09/28/2015 : PASH  Treatment summary: 1. Completed Cycle 4 dose dense Adriamycin and Cytoxan on 10/31/15; completed cycle 3 Taxol and carboplatin, completed 7 cycles of Taxol and treatment discontinued for poor tolerance. 2. left lumpectomy 02/13/2016  3. Adjuvant radiation 03/26/2016 to09/07/2016 Patient was offered treatment with Xeloda as well as immunotherapy clinical trial. She refused both of those  options. ----------------------------------------------------------------------------------------------------------------------------------------------------- Relapse: CT chest 11/13/2016 revealed multiple lung nodules and mediastinal lymph nodes 11/20/2016: Lung biopsy: Metastatic breast cancer triple negative PET/CT scan 12/04/2016: Multiple bilateral lung nodules or masses, 12 mm LUL, to 3 cm masses RML, 2.3 cm posterior left lung base, extensive thoracic lymphadenopathy 2.9 cm prevascular, 2.2 cm subcarinal, 2 cm left hilar, 1.5 cm right infrahilar, 1.5 cm right IMA , additional bone metastases right acetabulum CT chest 01/30/2017 demonstrates worsened tumor burden with significant abnormal nodal enlargement in chest, new and enlarged pulmonary nodules and masses, new and enlarged right breast and subcutaneous masses and nodules, trace bilateral pleural effusions  Xeloda from April to June, stopped due to progression  -----------------------------------------------------------------------------------------------------------------------------------------------------  Baylyn was supposed to start Eribulin today for her metastatic triple negative breast cancer, however, due to Atrial fibrillation at 130, we will not treat today.  I spoke with Dr. Glori Bickers regarding her, and the fact that she has no symptoms, and reviewed patient's vital signs with him.  After discussion with Dr. Haroldine Laws, Kyliyah will continue on Amiodarone 410m PO BID and NOT TAPER, she will increase Carvedilol to 6.266mper day.  Magnesium was added to today's labs.  These orders were covered with patient in detail and she was given written instructions of her medication list, and what to take.  Should she become symptomatic, or worsen in any way, shape or form, [facility]  Patient and her daughter verbalized understanding of the plan.    Aris will follow up with Dr.  Haroldine Laws on Thursday, 02/21/2017.  I gave them the details of this appointment.     All questions were answered. The patient knows to call the clinic with any problems, questions or concerns. We can certainly see the patient much sooner if necessary.  A total of (50) minutes of face-to-face time was spent with this patient with greater than 50% of that time in counseling and care-coordination.  This note was electronically signed. Scot Dock, NP 02/18/2017

## 2017-02-16 NOTE — Assessment & Plan Note (Addendum)
Left lumpectomy 02/13/2016: IDC grade 3, 3.3 cm, margins negative, 0/3 lymph nodes negative, triple negative, T2 N0 stage II a pathologic stage  Left breast biopsy 08/17/2015:1:30 position: Invasive ductal carcinoma, grade 3, ER 0%, PR 0%, HER-2 negative ratio 1.48, Ki-67 90%, 3.1 cm tumor, axilla negative, T2 N0 stage II a clinical stage Breast biopsies x 2: 09/28/2015 : PASH  Treatment summary: 1. Completed Cycle 4 dose dense Adriamycin and Cytoxan on 10/31/15; completed cycle 3 Taxol and carboplatin, completed 7 cycles of Taxol and treatment discontinued for poor tolerance. 2. left lumpectomy 02/13/2016  3. Adjuvant radiation 03/26/2016 to09/07/2016 Patient was offered treatment with Xeloda as well as immunotherapy clinical trial. She refused both of those options. ----------------------------------------------------------------------------------------------------------------------------------------------------- Relapse: CT chest 11/13/2016 revealed multiple lung nodules and mediastinal lymph nodes 11/20/2016: Lung biopsy: Metastatic breast cancer triple negative PET/CT scan 12/04/2016: Multiple bilateral lung nodules or masses, 12 mm LUL, to 3 cm masses RML, 2.3 cm posterior left lung base, extensive thoracic lymphadenopathy 2.9 cm prevascular, 2.2 cm subcarinal, 2 cm left hilar, 1.5 cm right infrahilar, 1.5 cm right IMA , additional bone metastases right acetabulum CT chest 01/30/2017 demonstrates worsened tumor burden with significant abnormal nodal enlargement in chest, new and enlarged pulmonary nodules and masses, new and enlarged right breast and subcutaneous masses and nodules, trace bilateral pleural effusions  Xeloda from April to June, stopped due to progression  -----------------------------------------------------------------------------------------------------------------------------------------------------  Ratasha was supposed to start Eribulin today for her metastatic  triple negative breast cancer, however, due to Atrial fibrillation at 130, we will not treat today.  I spoke with Dr. Daniel Bensimhon regarding her, and the fact that she has no symptoms, and reviewed patient's vital signs with him.  After discussion with Dr. Bensimhon, Mellie will continue on Amiodarone 400mg PO BID and NOT TAPER, she will increase Carvedilol to 6.25mg per day.  Magnesium was added to today's labs.  These orders were covered with patient in detail and she was given written instructions of her medication list, and what to take.  Should she become symptomatic, or worsen in any way, shape or form, [facility]  Patient and her daughter verbalized understanding of the plan.    Shaleena will follow up with Dr. Bensimhon on Thursday, 02/21/2017.  I gave them the details of this appointment.   

## 2017-02-18 ENCOUNTER — Encounter: Payer: Self-pay | Admitting: Adult Health

## 2017-02-18 ENCOUNTER — Ambulatory Visit (HOSPITAL_BASED_OUTPATIENT_CLINIC_OR_DEPARTMENT_OTHER): Payer: Federal, State, Local not specified - PPO | Admitting: Adult Health

## 2017-02-18 ENCOUNTER — Other Ambulatory Visit (HOSPITAL_BASED_OUTPATIENT_CLINIC_OR_DEPARTMENT_OTHER): Payer: Federal, State, Local not specified - PPO

## 2017-02-18 ENCOUNTER — Ambulatory Visit: Payer: Federal, State, Local not specified - PPO

## 2017-02-18 ENCOUNTER — Other Ambulatory Visit: Payer: Self-pay | Admitting: *Deleted

## 2017-02-18 VITALS — BP 157/92 | HR 121 | Temp 98.3°F | Resp 20 | Ht 67.0 in | Wt 206.2 lb

## 2017-02-18 DIAGNOSIS — I4891 Unspecified atrial fibrillation: Secondary | ICD-10-CM | POA: Diagnosis not present

## 2017-02-18 DIAGNOSIS — I4819 Other persistent atrial fibrillation: Secondary | ICD-10-CM

## 2017-02-18 DIAGNOSIS — I5020 Unspecified systolic (congestive) heart failure: Secondary | ICD-10-CM | POA: Diagnosis not present

## 2017-02-18 DIAGNOSIS — Z171 Estrogen receptor negative status [ER-]: Secondary | ICD-10-CM

## 2017-02-18 DIAGNOSIS — C778 Secondary and unspecified malignant neoplasm of lymph nodes of multiple regions: Secondary | ICD-10-CM

## 2017-02-18 DIAGNOSIS — C7951 Secondary malignant neoplasm of bone: Secondary | ICD-10-CM

## 2017-02-18 DIAGNOSIS — H109 Unspecified conjunctivitis: Secondary | ICD-10-CM

## 2017-02-18 DIAGNOSIS — N63 Unspecified lump in unspecified breast: Secondary | ICD-10-CM

## 2017-02-18 DIAGNOSIS — C7800 Secondary malignant neoplasm of unspecified lung: Secondary | ICD-10-CM | POA: Diagnosis not present

## 2017-02-18 DIAGNOSIS — J910 Malignant pleural effusion: Secondary | ICD-10-CM

## 2017-02-18 DIAGNOSIS — C50412 Malignant neoplasm of upper-outer quadrant of left female breast: Secondary | ICD-10-CM

## 2017-02-18 DIAGNOSIS — D649 Anemia, unspecified: Secondary | ICD-10-CM

## 2017-02-18 DIAGNOSIS — E860 Dehydration: Secondary | ICD-10-CM

## 2017-02-18 LAB — CBC WITH DIFFERENTIAL/PLATELET
BASO%: 0.6 % (ref 0.0–2.0)
Basophils Absolute: 0 10*3/uL (ref 0.0–0.1)
EOS%: 1.2 % (ref 0.0–7.0)
Eosinophils Absolute: 0.1 10*3/uL (ref 0.0–0.5)
HCT: 34.7 % — ABNORMAL LOW (ref 34.8–46.6)
HGB: 11.3 g/dL — ABNORMAL LOW (ref 11.6–15.9)
LYMPH%: 30.1 % (ref 14.0–49.7)
MCH: 28.7 pg (ref 25.1–34.0)
MCHC: 32.5 g/dL (ref 31.5–36.0)
MCV: 88.2 fL (ref 79.5–101.0)
MONO#: 1.1 10*3/uL — ABNORMAL HIGH (ref 0.1–0.9)
MONO%: 19.5 % — ABNORMAL HIGH (ref 0.0–14.0)
NEUT%: 48.6 % (ref 38.4–76.8)
NEUTROS ABS: 2.6 10*3/uL (ref 1.5–6.5)
Platelets: 367 10*3/uL (ref 145–400)
RBC: 3.93 10*6/uL (ref 3.70–5.45)
RDW: 21.3 % — ABNORMAL HIGH (ref 11.2–14.5)
WBC: 5.4 10*3/uL (ref 3.9–10.3)
lymph#: 1.6 10*3/uL (ref 0.9–3.3)

## 2017-02-18 LAB — COMPREHENSIVE METABOLIC PANEL
ALT: 92 U/L — AB (ref 0–55)
AST: 48 U/L — AB (ref 5–34)
Albumin: 2.5 g/dL — ABNORMAL LOW (ref 3.5–5.0)
Alkaline Phosphatase: 112 U/L (ref 40–150)
Anion Gap: 7 mEq/L (ref 3–11)
BUN: 30.7 mg/dL — AB (ref 7.0–26.0)
CHLORIDE: 103 meq/L (ref 98–109)
CO2: 25 meq/L (ref 22–29)
CREATININE: 0.8 mg/dL (ref 0.6–1.1)
Calcium: 9.7 mg/dL (ref 8.4–10.4)
EGFR: >90 mL/min/{1.73_m2} (ref >90)
Glucose: 99 mg/dl (ref 70–140)
Potassium: 4.1 mEq/L (ref 3.5–5.1)
Sodium: 135 mEq/L — ABNORMAL LOW (ref 136–145)
TOTAL PROTEIN: 6.4 g/dL (ref 6.4–8.3)
Total Bilirubin: 0.43 mg/dL (ref 0.20–1.20)

## 2017-02-18 LAB — MAGNESIUM: MAGNESIUM: 1.9 mg/dL (ref 1.5–2.5)

## 2017-02-18 LAB — TECHNOLOGIST REVIEW

## 2017-02-18 MED ORDER — TOBRAMYCIN-DEXAMETHASONE 0.3-0.1 % OP SUSP: 2.0000 [drp] | OPHTHALMIC | 0 refills | Status: DC

## 2017-02-18 MED ORDER — CARVEDILOL 6.25 MG PO TABS: 6.2500 mg | ORAL_TABLET | Freq: Two times a day (BID) | ORAL | 0 refills | Status: DC

## 2017-02-18 MED ORDER — SODIUM CHLORIDE 0.9 % IJ SOLN
10.0000 mL | INTRAMUSCULAR | Status: DC | PRN
  Filled 2017-02-18: qty 10

## 2017-02-18 MED ORDER — HEPARIN SOD (PORK) LOCK FLUSH 100 UNIT/ML IV SOLN
500.0000 [IU] | Freq: Once | INTRAVENOUS | Status: AC | PRN
  Administered 2017-02-18: 500 [IU] via INTRAVENOUS
  Filled 2017-02-18: qty 5

## 2017-02-18 MED ORDER — SODIUM CHLORIDE 0.9 % IJ SOLN
10.0000 mL | INTRAMUSCULAR | Status: DC | PRN
Start: 2017-02-18 — End: 2017-02-18
  Administered 2017-02-18: 10 mL via INTRAVENOUS
  Filled 2017-02-18: qty 10

## 2017-02-18 NOTE — Patient Instructions (Signed)

## 2017-02-21 ENCOUNTER — Ambulatory Visit (HOSPITAL_COMMUNITY)
Admission: RE | Admit: 2017-02-21 | Discharge: 2017-02-21 | Disposition: A | Discharge status: Home / Self Care | Payer: Federal, State, Local not specified - PPO | Source: Ambulatory Visit | Attending: Internal Medicine | Admitting: Internal Medicine

## 2017-02-21 ENCOUNTER — Ambulatory Visit (HOSPITAL_BASED_OUTPATIENT_CLINIC_OR_DEPARTMENT_OTHER)
Admission: RE | Admit: 2017-02-21 | Discharge: 2017-02-21 | Disposition: A | Discharge status: Home / Self Care | Payer: Federal, State, Local not specified - PPO | Source: Ambulatory Visit | Attending: Internal Medicine | Admitting: Internal Medicine

## 2017-02-21 VITALS — BP 114/88 | HR 106 | Wt 207.0 lb

## 2017-02-21 DIAGNOSIS — R05 Cough: Secondary | ICD-10-CM

## 2017-02-21 DIAGNOSIS — I5022 Chronic systolic (congestive) heart failure: Secondary | ICD-10-CM | POA: Diagnosis not present

## 2017-02-21 DIAGNOSIS — Z803 Family history of malignant neoplasm of breast: Secondary | ICD-10-CM

## 2017-02-21 DIAGNOSIS — R059 Cough, unspecified: Secondary | ICD-10-CM

## 2017-02-21 DIAGNOSIS — I429 Cardiomyopathy, unspecified: Secondary | ICD-10-CM | POA: Diagnosis not present

## 2017-02-21 DIAGNOSIS — C7802 Secondary malignant neoplasm of left lung: Secondary | ICD-10-CM

## 2017-02-21 DIAGNOSIS — J45909 Unspecified asthma, uncomplicated: Secondary | ICD-10-CM | POA: Insufficient documentation

## 2017-02-21 DIAGNOSIS — Z7901 Long term (current) use of anticoagulants: Secondary | ICD-10-CM

## 2017-02-21 DIAGNOSIS — G43909 Migraine, unspecified, not intractable, without status migrainosus: Secondary | ICD-10-CM

## 2017-02-21 DIAGNOSIS — C7801 Secondary malignant neoplasm of right lung: Secondary | ICD-10-CM | POA: Insufficient documentation

## 2017-02-21 DIAGNOSIS — I420 Dilated cardiomyopathy: Secondary | ICD-10-CM | POA: Diagnosis not present

## 2017-02-21 DIAGNOSIS — I2699 Other pulmonary embolism without acute cor pulmonale: Secondary | ICD-10-CM | POA: Diagnosis not present

## 2017-02-21 DIAGNOSIS — J90 Pleural effusion, not elsewhere classified: Secondary | ICD-10-CM | POA: Insufficient documentation

## 2017-02-21 DIAGNOSIS — C50412 Malignant neoplasm of upper-outer quadrant of left female breast: Secondary | ICD-10-CM

## 2017-02-21 DIAGNOSIS — I480 Paroxysmal atrial fibrillation: Secondary | ICD-10-CM

## 2017-02-21 DIAGNOSIS — K219 Gastro-esophageal reflux disease without esophagitis: Secondary | ICD-10-CM | POA: Insufficient documentation

## 2017-02-21 DIAGNOSIS — I5023 Acute on chronic systolic (congestive) heart failure: Secondary | ICD-10-CM

## 2017-02-21 DIAGNOSIS — Z853 Personal history of malignant neoplasm of breast: Secondary | ICD-10-CM | POA: Insufficient documentation

## 2017-02-21 MED ORDER — POTASSIUM CHLORIDE CRYS ER 20 MEQ PO TBCR: 20.0000 meq | EXTENDED_RELEASE_TABLET | Freq: Every day | ORAL | 3 refills | Status: DC

## 2017-02-21 MED ORDER — FUROSEMIDE 10 MG/ML IJ SOLN
40.0000 mg | Freq: Once | INTRAMUSCULAR | Status: AC
  Administered 2017-02-21: 40 mg via INTRAVENOUS
  Filled 2017-02-21: qty 4

## 2017-02-21 MED ORDER — POTASSIUM CHLORIDE CRYS ER 20 MEQ PO TBCR
20.0000 meq | EXTENDED_RELEASE_TABLET | Freq: Once | ORAL | Status: AC
  Administered 2017-02-21: 20 meq via ORAL
  Filled 2017-02-21: qty 1

## 2017-02-21 MED ORDER — FUROSEMIDE 40 MG PO TABS: 40.0000 mg | ORAL_TABLET | Freq: Every day | ORAL | 3 refills | Status: DC

## 2017-02-21 NOTE — Patient Instructions (Signed)
Start Furosemide (Lasix) 40 mg daily  Start K-dur 20 meq daily  Follow up with Oncology Mon, they will call you to schedule  Your physician recommends that you schedule a follow-up appointment in: 2 weeks

## 2017-02-21 NOTE — Progress Notes (Signed)
    ReDS Vest - 02/21/17 1500      ReDS Vest   MR  No   Estimated volume prior to reading High   Fitting Posture Sitting   Height Marker Tall   Ruler Value 7   Center Strip Shifted   ReDS Value 40

## 2017-02-21 NOTE — Progress Notes (Signed)
Advanced Heart Failure Medication Review by a Pharmacist  Does the patient  feel that his/her medications are working for him/her?  yes  Has the patient been experiencing any side effects to the medications prescribed?  yes  Does the patient measure his/her own blood pressure or blood glucose at home?  no   Does the patient have any problems obtaining medications due to transportation or finances?   no  Understanding of regimen: good Understanding of indications: good Potential of compliance: good Patient understands to avoid NSAIDs. Patient understands to avoid decongestants.  Issues to address at subsequent visits: none.   Pharmacist comments: Mrs. Disbro is a pleasant 46 y/o female who presents with no medication bottles or list but good recollection of her medication regimen. She endorses good adherence to her medications and reports no medication related questions or concerns for me at this time.   Phillis Knack PharmD Candidate   Time with patient: 15 minutes Preparation and documentation time: 5 minutes Total time: 20 minutes

## 2017-02-21 NOTE — Progress Notes (Signed)
Advanced Heart Failure Outpatient Consult   PCP: Dr Ivette Loyal Primary Cardiologist: Dr Radford Pax  Oncologist: Dr Lindi Adie  HPI: Cindy Bradshaw is being seen today for evaluation of A fib and new acute systolic heart failure at the request of Dr Radford Pax.   Cindy Bradshaw is a 46 year old with a history of  L upper breast cancer with mets to lungs, GERD, and recent new onset A fib in June of this year.    Treatment summary: 1. Completed Cycle 4 dose dense Adriamycin and Cytoxan on 10/31/15; completed cycle 3 Taxol and carboplatin, completed 7 cycles of Taxol and treatment discontinued for poor tolerance. 2. left lumpectomy 02/13/2016  3. Adjuvant radiation 03/26/2016 to09/11/2017Breast Cancer treatment  Admitted to [facility] 02/11/17 with new onset a fib and new onset systolic heart failure. Started on amio + coreg with conversion to NSR. VQ scan was negative for PE. ECHO showed newly reduced EF 25-30%. Discharged on amio 400 mg twice a day, coreg 3.125 mg twice a day and xarelto 20 mg daily.  Discharged on 02/14/17 in NSR.   She returned to Oncology for post hospital visit on 02/16/2017 and was back in A fib RVR 130s. She was asymptomatic and continued on amio 400 mg twice a day and carvedilol was increased to 6.25 mg twice a day.   Today she presents as a new patient. Overall feeling terrible. Complaining of severe dry hacking cough. No fever or chills. Cough better when lying down worse when sittiing up. Limiting mobility due to cough. Denies orthopnea or PND. No edema    Cardiac Studies Echocardiogram 02/12/17: Study Conclusions - Left ventricle: The cavity size was severely dilated. Wall thickness was normal. Systolic function was severely reduced. The estimated ejection fraction was in the range of 25% to 30%. Diffuse hypokinesis. The study is not technically sufficient to allow evaluation of LV diastolic function. - Mitral valve: There was mild regurgitation. - Left atrium: The atrium was  moderately dilated. - Right atrium: The atrium was mildly dilated.  Echocardiogram 09/13/15: Study Conclusions - Left ventricle: The cavity size was normal. There was mild focal basal hypertrophy of the septum. Systolic function was normal. The estimated ejection fraction was in the range of 55% to 60%. Wall motion was normal; there were no regional wall motion abnormalities. Global longitudinal strain: -18.4% There was no evidence of elevated ventricular filling pressure by Doppler parameters.   Left heart cath 01/24/16: 1. Angiographically normal coronary arteries 2. The left ventricular systolic function is normal. Essentially normal coronary arteries with no significant coronary disease noted to explain abnormal stress test.  Suspect breast attenuation is the explanation for the false-positive stress test. I suspect noncardiac chest pain  Myoview 01/23/16: 1. Positive for moderate size region of reversible ischemia in the anteroseptal wall of the mid ventricle extending through the apex to the true apex. 2. Normal left ventricular wall motion. 3. Left ventricular ejection fraction 54% 4. Non invasive risk stratification*: Intermediate   ROS: All systems negative except as listed in HPI, PMH and Problem List.  SH:  Social History   Social History  . Marital status: Legally Separated    Spouse name: Cindy Bradshaw  . Number of children: 2  . Years of education: [name]   Occupational History  . Web designer    Social History Main Topics  . Smoking status: Never Smoker  . Smokeless tobacco: Never Used  . Alcohol use 0.0 oz/week     Comment: occassional glass of wine  .  Drug use: No  . Sexual activity: Yes   Other Topics Concern  . Not on file   Social History Narrative   ** Merged History Encounter **       First MP age 77.  LMP 08/26/15.     2 children carried to term.  Age at first live birth 89   H/o oral contrasception use       FH:    Family History  Problem Relation Age of Onset  . Lung cancer Mother 37       2 different types of lung cancer; metastasis to brain; smoker  . Other Mother        hx of hysterectomy for unspecified reason  . Bone cancer Maternal Uncle        dx. early 54s  . Breast cancer Maternal Grandmother        dx. early 65s, s/p mastectomy  . Cancer Paternal Grandfather        unspecified type of cancer, dx. late 50s  . Congestive Heart Failure Father        smoker  . Stroke Father   . Eczema Father   . Cirrhosis Maternal Grandfather   . Heart Problems Maternal Grandfather   . Asthma Daughter   . Allergies Daughter        hives  . Lung cancer Other        (maternal great uncle; MGM's brother); had a coal stove  . Cancer Cousin        unspecified type; d. early age (paternal first cousin once-removed)  . Cancer Cousin        dx. as a kid; in remission today; (paternal 2nd cousin)  . Cancer Cousin        unspecified type; d. early 105s; (maternal 1st cousin)    Past Medical History:  Diagnosis Date  . Arthritis   . Asthma   . Back disorder    degenerative disk disease  . Breast cancer ([place])   . Cancer ([place])   . GERD (gastroesophageal reflux disease)   . Mammogram abnormal 08/24/15   first  . Migraine   . Pap smear for cervical cancer screening 08/12/15    Current Outpatient Prescriptions  Medication Sig Dispense Refill  . albuterol (PROVENTIL HFA;VENTOLIN HFA) 108 (90 Base) MCG/ACT inhaler Inhale 1-2 puffs into the lungs every 6 (six) hours as needed for wheezing or shortness of breath. 1 Inhaler 0  . albuterol (PROVENTIL) (2.5 MG/3ML) 0.083% nebulizer solution Take 2.5 mg by nebulization every 6 (six) hours as needed for wheezing or shortness of breath.    Marland Kitchen amiodarone (PACERONE) 400 MG tablet Take 400 mg by mouth 2 (two) times daily.    . carvedilol (COREG) 6.25 MG tablet Take 1 tablet (6.25 mg total) by mouth 2 (two) times daily with a meal. 60 tablet 0  . cholecalciferol  (VITAMIN D) 1000 units tablet Take 1,000 Units by mouth daily.    Marland Kitchen dexamethasone (DECADRON) 4 MG tablet Take 1 tablet (4 mg total) by mouth daily. 30 tablet 0  . ferrous sulfate 325 (65 FE) MG EC tablet Take 325 mg by mouth 3 (three) times daily with meals.    Marland Kitchen losartan (COZAAR) 25 MG tablet Take 1 tablet (25 mg total) by mouth daily. 30 tablet 1  . Melatonin 10 MG TABS Take 10 mg by mouth at bedtime.     Marland Kitchen morphine (MSIR) 30 MG tablet Take 30 mg by mouth every 4 (four) hours  as needed for severe pain.    Marland Kitchen ondansetron (ZOFRAN) 8 MG tablet Take 1 tablet (8 mg total) by mouth 2 (two) times daily as needed (Nausea or vomiting). 30 tablet 1  . rivaroxaban (XARELTO) 20 MG TABS tablet Take 1 tablet (20 mg total) by mouth daily with supper. 30 tablet 1  . senna (SENOKOT) 8.6 MG TABS tablet Take 1 tablet by mouth daily as needed for mild constipation.    Marland Kitchen tobramycin-dexamethasone (TOBRADEX) ophthalmic solution Place 2 drops into both eyes every 4 (four) hours while awake. 5 mL 0  . traMADol (ULTRAM) 50 MG tablet Take 1 tablet (50 mg total) by mouth every 6 (six) hours as needed for moderate pain (or Headache unrelieved by tylenol). 20 tablet 0   No current facility-administered medications for this encounter.    Facility-Administered Medications Ordered in Other Encounters  Medication Dose Route Frequency Provider Last Rate Last Dose  . 0.9 %  sodium chloride infusion   Intravenous Once Gentry Fitz F, NP        Vitals:   02/21/17 1542  BP: 114/88  Pulse: (!) 106  SpO2: 100%  Weight: 207 lb (93.9 kg)   Filed Weights   02/21/17 1542  Weight: 207 lb (93.9 kg)     PHYSICAL EXAM: General:  Appears chronically ill. Arrived in wheel chair. Coughing.  HEENT: normal Neck: supple. JVP hard to assess due to cough.  Carotids 2+ bilaterally; no bruits. No lymphadenopathy or thryomegaly appreciated. Cor: PMI normal. Tachy. Regular No rubs, gallops or murmurs. Lungs: clear but hacking cough    Abdomen: obese soft, nontender, nondistended. No hepatosplenomegaly. No bruits or masses. Good bowel sounds. Extremities: no cyanosis, clubbing, rash, edema. RUE double lumen picc  Neuro: alert & orientedx3, cranial nerves grossly intact. Moves all 4 extremities w/o difficulty. Affect flat.  ECG: Sinus tach 101 bpm  No ST-T wave abnormalities.    ASSESSMENT & PLAN:  1. Acute/ Chronic Systolic Heart Failure  ECHO EF 25-30%.  NYHA III. Volume status mildly elevated. [place] Reading 40. Given 40 mg IV lasix + 20 meq potassium x1.  Continue low dose bb.  Start 40 mg po lasix daily + 20 meq potassium daily. .   2. PAF- maintaining NSR. Continue amiodarone 400 mg twice a day. Continue xarelto 20 mg daily  3. Left Breast Cancer with mets to lungs- per Dr Lindi Adie.  4.. Cough. - CXR now. Dr Haroldine Laws reviewed CXR. Bilateral lung mets. No edema..   Dr Haroldine Laws discussed with oncology. Plan for follow up with oncology Monday.   Darrick Grinder NP-C  3:55 PM  Patient seen and examined with Darrick Grinder, NP. We discussed all aspects of the encounter. I agree with the assessment and plan as stated above.   Very difficult situation. She has newly diagnosed systolic HF. Suspect this is related to adriamycin but may also be tachy-induced from AF.   Now back in NSR on amio. Continue amio 400 bid and Xarelto.   Has severe cough. On exam doesn't appear overly volume overloaded but ReDS vest sows 40% lung water (normal 20-35%). CXR obtained in clinic which shows bilateral lung mets and no infiltrate or edema (viewed personally). Unclear if lung mets are altering Vest reading. Will give lasix 40mg  IV x 1 in clinic. Start lasix 40mg  po daily and kcl 20 daily. I spoke with Charlestine Massed NP in [facility] who will f/u with her on Monday and we will see back in 12- weeks. Patient  realizes that if she is worse over weekend to come to ER.  Total time spent > 60 minutes. Over half that time spent discussing  above.   Glori Bickers, MD  6:28 PM

## 2017-02-22 ENCOUNTER — Encounter (HOSPITAL_COMMUNITY): Payer: Self-pay

## 2017-02-22 DIAGNOSIS — T451X5A Adverse effect of antineoplastic and immunosuppressive drugs, initial encounter: Secondary | ICD-10-CM | POA: Diagnosis present

## 2017-02-22 DIAGNOSIS — C7801 Secondary malignant neoplasm of right lung: Secondary | ICD-10-CM | POA: Diagnosis present

## 2017-02-22 DIAGNOSIS — Z885 Allergy status to narcotic agent status: Secondary | ICD-10-CM

## 2017-02-22 DIAGNOSIS — D638 Anemia in other chronic diseases classified elsewhere: Secondary | ICD-10-CM | POA: Diagnosis present

## 2017-02-22 DIAGNOSIS — Z853 Personal history of malignant neoplasm of breast: Secondary | ICD-10-CM

## 2017-02-22 DIAGNOSIS — I5042 Chronic combined systolic (congestive) and diastolic (congestive) heart failure: Secondary | ICD-10-CM | POA: Diagnosis present

## 2017-02-22 DIAGNOSIS — G620 Drug-induced polyneuropathy: Secondary | ICD-10-CM | POA: Diagnosis present

## 2017-02-22 DIAGNOSIS — K219 Gastro-esophageal reflux disease without esophagitis: Secondary | ICD-10-CM | POA: Diagnosis present

## 2017-02-22 DIAGNOSIS — C7802 Secondary malignant neoplasm of left lung: Secondary | ICD-10-CM | POA: Diagnosis present

## 2017-02-22 DIAGNOSIS — G629 Polyneuropathy, unspecified: Secondary | ICD-10-CM | POA: Diagnosis present

## 2017-02-22 DIAGNOSIS — Z888 Allergy status to other drugs, medicaments and biological substances status: Secondary | ICD-10-CM

## 2017-02-22 DIAGNOSIS — I4892 Unspecified atrial flutter: Secondary | ICD-10-CM | POA: Diagnosis present

## 2017-02-22 DIAGNOSIS — I429 Cardiomyopathy, unspecified: Secondary | ICD-10-CM | POA: Diagnosis present

## 2017-02-22 DIAGNOSIS — Z7901 Long term (current) use of anticoagulants: Secondary | ICD-10-CM

## 2017-02-22 DIAGNOSIS — Z91041 Radiographic dye allergy status: Secondary | ICD-10-CM

## 2017-02-22 DIAGNOSIS — E876 Hypokalemia: Secondary | ICD-10-CM | POA: Diagnosis present

## 2017-02-22 DIAGNOSIS — I480 Paroxysmal atrial fibrillation: Secondary | ICD-10-CM | POA: Diagnosis present

## 2017-02-22 DIAGNOSIS — I2699 Other pulmonary embolism without acute cor pulmonale: Principal | ICD-10-CM | POA: Diagnosis present

## 2017-02-22 MED ORDER — FENTANYL CITRATE (PF) 100 MCG/2ML IJ SOLN
50.0000 ug | INTRAMUSCULAR | Status: DC | PRN
  Administered 2017-02-22: 50 ug via INTRAVENOUS

## 2017-02-22 MED ORDER — FENTANYL CITRATE (PF) 100 MCG/2ML IJ SOLN
INTRAMUSCULAR | Status: AC
  Filled 2017-02-22: qty 2

## 2017-02-22 NOTE — ED Triage Notes (Signed)
Pt complaining of SOB and chest pain. Pt states has breast and lung cancer. Pt states coughing since cancer.

## 2017-02-23 ENCOUNTER — Encounter (HOSPITAL_COMMUNITY): Payer: Self-pay | Admitting: Physician Assistant

## 2017-02-23 ENCOUNTER — Observation Stay (HOSPITAL_COMMUNITY): Payer: Federal, State, Local not specified - PPO

## 2017-02-23 ENCOUNTER — Other Ambulatory Visit: Payer: Self-pay

## 2017-02-23 ENCOUNTER — Inpatient Hospital Stay (HOSPITAL_COMMUNITY)
Admission: EM | Admit: 2017-02-23 | Discharge: 2017-02-26 | DRG: 176 | Disposition: A | Discharge status: Home / Self Care | Payer: Federal, State, Local not specified - PPO | Attending: Internal Medicine | Admitting: Internal Medicine

## 2017-02-23 ENCOUNTER — Emergency Department (HOSPITAL_COMMUNITY): Payer: Federal, State, Local not specified - PPO

## 2017-02-23 DIAGNOSIS — I4891 Unspecified atrial fibrillation: Secondary | ICD-10-CM | POA: Diagnosis present

## 2017-02-23 DIAGNOSIS — R0602 Shortness of breath: Secondary | ICD-10-CM

## 2017-02-23 DIAGNOSIS — R0789 Other chest pain: Secondary | ICD-10-CM

## 2017-02-23 DIAGNOSIS — R079 Chest pain, unspecified: Secondary | ICD-10-CM | POA: Diagnosis present

## 2017-02-23 DIAGNOSIS — I429 Cardiomyopathy, unspecified: Secondary | ICD-10-CM | POA: Diagnosis not present

## 2017-02-23 DIAGNOSIS — C50412 Malignant neoplasm of upper-outer quadrant of left female breast: Secondary | ICD-10-CM | POA: Diagnosis present

## 2017-02-23 DIAGNOSIS — I208 Other forms of angina pectoris: Secondary | ICD-10-CM | POA: Diagnosis not present

## 2017-02-23 DIAGNOSIS — I2699 Other pulmonary embolism without acute cor pulmonale: Secondary | ICD-10-CM | POA: Diagnosis not present

## 2017-02-23 DIAGNOSIS — G620 Drug-induced polyneuropathy: Secondary | ICD-10-CM | POA: Diagnosis present

## 2017-02-23 DIAGNOSIS — R0600 Dyspnea, unspecified: Secondary | ICD-10-CM | POA: Diagnosis present

## 2017-02-23 DIAGNOSIS — T451X5A Adverse effect of antineoplastic and immunosuppressive drugs, initial encounter: Secondary | ICD-10-CM | POA: Diagnosis present

## 2017-02-23 DIAGNOSIS — I480 Paroxysmal atrial fibrillation: Secondary | ICD-10-CM | POA: Diagnosis not present

## 2017-02-23 HISTORY — DX: Unspecified atrial flutter: I48.92

## 2017-02-23 HISTORY — DX: Reserved for inherently not codable concepts without codable children: IMO0001

## 2017-02-23 HISTORY — DX: Paroxysmal atrial fibrillation: I48.0

## 2017-02-23 HISTORY — DX: Encounter for observation for other suspected diseases and conditions ruled out: Z03.89

## 2017-02-23 HISTORY — DX: Cardiomyopathy, unspecified: I42.9

## 2017-02-23 LAB — BASIC METABOLIC PANEL
Anion gap: 9 (ref 5–15)
BUN: 16 mg/dL (ref 6–20)
CALCIUM: 9.2 mg/dL (ref 8.9–10.3)
CO2: 23 mmol/L (ref 22–32)
CREATININE: 1.04 mg/dL — AB (ref 0.44–1.00)
Chloride: 100 mmol/L — ABNORMAL LOW (ref 101–111)
GFR calc non Af Amer: >60 mL/min (ref >60)
Glucose, Bld: 119 mg/dL — ABNORMAL HIGH (ref 65–99)
Potassium: 3.4 mmol/L — ABNORMAL LOW (ref 3.5–5.1)
Sodium: 132 mmol/L — ABNORMAL LOW (ref 135–145)

## 2017-02-23 LAB — PHOSPHORUS: Phosphorus: 2.5 mg/dL (ref 2.5–4.6)

## 2017-02-23 LAB — CBC
HCT: 31.5 % — ABNORMAL LOW (ref 36.0–46.0)
Hemoglobin: 10.1 g/dL — ABNORMAL LOW (ref 12.0–15.0)
MCH: 27.9 pg (ref 26.0–34.0)
MCHC: 32.1 g/dL (ref 30.0–36.0)
MCV: 87 fL (ref 78.0–100.0)
PLATELETS: 427 10*3/uL — AB (ref 150–400)
RBC: 3.62 MIL/uL — AB (ref 3.87–5.11)
RDW: 19.5 % — ABNORMAL HIGH (ref 11.5–15.5)
WBC: 9.3 10*3/uL (ref 4.0–10.5)

## 2017-02-23 LAB — MAGNESIUM: MAGNESIUM: 1.8 mg/dL (ref 1.7–2.4)

## 2017-02-23 LAB — BRAIN NATRIURETIC PEPTIDE: B Natriuretic Peptide: 116.2 pg/mL — ABNORMAL HIGH (ref 0.0–100.0)

## 2017-02-23 LAB — TROPONIN I

## 2017-02-23 LAB — I-STAT TROPONIN, ED: TROPONIN I, POC: 0.02 ng/mL (ref 0.00–0.08)

## 2017-02-23 MED ORDER — MELATONIN 3 MG PO TABS
9.0000 mg | ORAL_TABLET | Freq: Every day | ORAL | Status: DC
  Administered 2017-02-23 – 2017-02-25 (×3): 9 mg via ORAL
  Filled 2017-02-23 (×3): qty 3

## 2017-02-23 MED ORDER — GUAIFENESIN-DM 100-10 MG/5ML PO SYRP
5.0000 mL | ORAL_SOLUTION | ORAL | Status: DC | PRN
  Administered 2017-02-23 – 2017-02-26 (×7): 5 mL via ORAL
  Filled 2017-02-23 (×7): qty 5

## 2017-02-23 MED ORDER — MORPHINE SULFATE 15 MG PO TABS
30.0000 mg | ORAL_TABLET | ORAL | Status: DC | PRN
  Administered 2017-02-23 – 2017-02-26 (×9): 30 mg via ORAL
  Filled 2017-02-23 (×9): qty 2

## 2017-02-23 MED ORDER — FERROUS SULFATE 325 (65 FE) MG PO TBEC: 325.0000 mg | DELAYED_RELEASE_TABLET | Freq: Three times a day (TID) | ORAL | Status: DC

## 2017-02-23 MED ORDER — ENOXAPARIN SODIUM 100 MG/ML ~~LOC~~ SOLN
95.0000 mg | Freq: Two times a day (BID) | SUBCUTANEOUS | Status: DC
  Administered 2017-02-23 – 2017-02-25 (×4): 95 mg via SUBCUTANEOUS
  Filled 2017-02-23 (×6): qty 1

## 2017-02-23 MED ORDER — MORPHINE SULFATE (PF) 4 MG/ML IV SOLN
2.0000 mg | INTRAVENOUS | Status: DC | PRN
  Administered 2017-02-23 – 2017-02-24 (×5): 2 mg via INTRAVENOUS
  Filled 2017-02-23 (×5): qty 1

## 2017-02-23 MED ORDER — ACETAMINOPHEN 325 MG PO TABS: 650.0000 mg | ORAL_TABLET | ORAL | Status: DC | PRN

## 2017-02-23 MED ORDER — MELATONIN 10 MG PO TABS: 10.0000 mg | ORAL_TABLET | Freq: Every day | ORAL | Status: DC

## 2017-02-23 MED ORDER — FUROSEMIDE 10 MG/ML IJ SOLN
40.0000 mg | Freq: Two times a day (BID) | INTRAMUSCULAR | Status: DC
  Administered 2017-02-23 – 2017-02-24 (×2): 40 mg via INTRAVENOUS
  Filled 2017-02-23 (×2): qty 4

## 2017-02-23 MED ORDER — SODIUM CHLORIDE 0.9 % IV SOLN
INTRAVENOUS | Status: DC
  Administered 2017-02-23 – 2017-02-24 (×2): via INTRAVENOUS

## 2017-02-23 MED ORDER — DIPHENHYDRAMINE HCL 25 MG PO CAPS
50.0000 mg | ORAL_CAPSULE | Freq: Once | ORAL | Status: AC
  Administered 2017-02-23: 50 mg via ORAL
  Filled 2017-02-23: qty 2

## 2017-02-23 MED ORDER — VITAMIN D 1000 UNITS PO TABS
1000.0000 [IU] | ORAL_TABLET | Freq: Every day | ORAL | Status: DC
  Administered 2017-02-23 – 2017-02-26 (×4): 1000 [IU] via ORAL
  Filled 2017-02-23 (×4): qty 1

## 2017-02-23 MED ORDER — ALBUTEROL SULFATE (2.5 MG/3ML) 0.083% IN NEBU: 2.5000 mg | INHALATION_SOLUTION | Freq: Four times a day (QID) | RESPIRATORY_TRACT | Status: DC | PRN

## 2017-02-23 MED ORDER — ALBUTEROL SULFATE HFA 108 (90 BASE) MCG/ACT IN AERS: 1.0000 | INHALATION_SPRAY | Freq: Four times a day (QID) | RESPIRATORY_TRACT | Status: DC | PRN

## 2017-02-23 MED ORDER — POTASSIUM CHLORIDE CRYS ER 20 MEQ PO TBCR: 20.0000 meq | EXTENDED_RELEASE_TABLET | Freq: Every day | ORAL | Status: DC

## 2017-02-23 MED ORDER — POTASSIUM CHLORIDE CRYS ER 20 MEQ PO TBCR
40.0000 meq | EXTENDED_RELEASE_TABLET | Freq: Once | ORAL | Status: AC
  Administered 2017-02-23: 40 meq via ORAL
  Filled 2017-02-23: qty 2

## 2017-02-23 MED ORDER — DEXAMETHASONE 4 MG PO TABS
4.0000 mg | ORAL_TABLET | Freq: Every day | ORAL | Status: DC
  Administered 2017-02-24 – 2017-02-26 (×3): 4 mg via ORAL
  Filled 2017-02-23 (×4): qty 1

## 2017-02-23 MED ORDER — DIPHENHYDRAMINE HCL 50 MG/ML IJ SOLN: 50.0000 mg | Freq: Once | INTRAMUSCULAR | Status: AC

## 2017-02-23 MED ORDER — GI COCKTAIL ~~LOC~~: 30.0000 mL | Freq: Four times a day (QID) | ORAL | Status: DC | PRN

## 2017-02-23 MED ORDER — ONDANSETRON HCL 4 MG/2ML IJ SOLN: 4.0000 mg | Freq: Four times a day (QID) | INTRAMUSCULAR | Status: DC | PRN

## 2017-02-23 MED ORDER — METOPROLOL TARTRATE 5 MG/5ML IV SOLN
5.0000 mg | Freq: Once | INTRAVENOUS | Status: AC
Start: 2017-02-23 — End: 2017-02-23
  Administered 2017-02-23: 5 mg via INTRAVENOUS
  Filled 2017-02-23: qty 5

## 2017-02-23 MED ORDER — ONDANSETRON HCL 4 MG PO TABS: 8.0000 mg | ORAL_TABLET | Freq: Two times a day (BID) | ORAL | Status: DC | PRN

## 2017-02-23 MED ORDER — NITROGLYCERIN 0.4 MG SL SUBL: 0.4000 mg | SUBLINGUAL_TABLET | SUBLINGUAL | Status: DC | PRN

## 2017-02-23 MED ORDER — TOBRAMYCIN-DEXAMETHASONE 0.3-0.1 % OP SUSP
2.0000 [drp] | OPHTHALMIC | Status: DC
  Filled 2017-02-23: qty 2.5

## 2017-02-23 MED ORDER — TRAMADOL HCL 50 MG PO TABS: 50.0000 mg | ORAL_TABLET | Freq: Four times a day (QID) | ORAL | Status: DC | PRN

## 2017-02-23 MED ORDER — CARVEDILOL 12.5 MG PO TABS
6.2500 mg | ORAL_TABLET | Freq: Two times a day (BID) | ORAL | Status: AC
  Administered 2017-02-23 – 2017-02-25 (×6): 6.25 mg via ORAL
  Filled 2017-02-23 (×5): qty 1

## 2017-02-23 MED ORDER — RIVAROXABAN 20 MG PO TABS: 20.0000 mg | ORAL_TABLET | Freq: Every day | ORAL | Status: DC

## 2017-02-23 MED ORDER — AMIODARONE HCL 200 MG PO TABS
400.0000 mg | ORAL_TABLET | Freq: Two times a day (BID) | ORAL | Status: DC
  Administered 2017-02-23 – 2017-02-26 (×7): 400 mg via ORAL
  Filled 2017-02-23 (×7): qty 2

## 2017-02-23 MED ORDER — HYDROCORTISONE NA SUCCINATE PF 250 MG IJ SOLR
200.0000 mg | Freq: Once | INTRAMUSCULAR | Status: AC
  Administered 2017-02-23: 200 mg via INTRAVENOUS
  Filled 2017-02-23: qty 200

## 2017-02-23 MED ORDER — FERROUS SULFATE 325 (65 FE) MG PO TABS
325.0000 mg | ORAL_TABLET | Freq: Three times a day (TID) | ORAL | Status: DC
  Administered 2017-02-23 – 2017-02-26 (×9): 325 mg via ORAL
  Filled 2017-02-23 (×9): qty 1

## 2017-02-23 MED ORDER — FUROSEMIDE 10 MG/ML IJ SOLN
40.0000 mg | Freq: Once | INTRAMUSCULAR | Status: AC
  Administered 2017-02-23: 40 mg via INTRAVENOUS
  Filled 2017-02-23: qty 4

## 2017-02-23 MED ORDER — SENNA 8.6 MG PO TABS: 1.0000 | ORAL_TABLET | Freq: Every day | ORAL | Status: DC | PRN

## 2017-02-23 MED ORDER — IOPAMIDOL (ISOVUE-370) INJECTION 76%
INTRAVENOUS | Status: AC
  Administered 2017-02-23: 100 mL
  Filled 2017-02-23: qty 100

## 2017-02-23 NOTE — Progress Notes (Signed)
ANTICOAGULATION CONSULT NOTE - Initial Consult  Pharmacy Consult for Lovenox Indication: pulmonary embolus  Allergies  Allergen Reactions  . Dilaudid [Hydromorphone Hcl] Itching  . Ivp Dye [Iodinated Diagnostic Agents] Itching  . Percocet [Oxycodone-Acetaminophen] Itching  . Tape Itching and Rash  . Toradol [Ketorolac Tromethamine] Itching    Patient Measurements: Height: 5\' 7"  (170.2 cm) Weight: 204 lb 12.9 oz (92.9 kg) IBW/kg (Calculated) : 61.6  Vital Signs: Temp: 98.1 F (36.7 C) (06/23 1152) Temp Source: Oral (06/23 1152) BP: 113/77 (06/23 1152) Pulse Rate: 91 (06/23 1152)  Labs:  Recent Labs  02/22/17 2336 02/23/17 1202  HGB 10.1*  --   HCT 31.5*  --   PLT 427*  --   CREATININE 1.04*  --   TROPONINI  --  <0.03    Estimated Creatinine Clearance: 78.2 mL/min (A) (by C-G formula based on SCr of 1.04 mg/dL (H)).   Medical History: Past Medical History:  Diagnosis Date  . Arthritis   . Asthma   . Back disorder    degenerative disk disease  . Breast cancer ([place])   . Cancer ([place])   . Cardiomyopathy ([place])    a. Dx 02/2017 - EF 25-30%, felt possibly due to adriamycin +/- tachy mediated from atrial fib/flutter.  Marland Kitchen GERD (gastroesophageal reflux disease)   . Mammogram abnormal 08/24/15   first  . Migraine   . Normal coronary arteries    01/2016 cath  . PAF (paroxysmal atrial fibrillation) ([place])   . Pap smear for cervical cancer screening 08/12/15  . Paroxysmal atrial flutter ([facility])    Assessment: 46yof on xarelto pta for afib, now with new PE. Given history of breast CA with mets undergoing chemo, will transition to therapeutic lovenox. Last xarelto dose 6/22 @ 2100. Wt = 93kg. Renal function wnl with CrCl 32ml/min.  Goal of Therapy:  Anti-Xa level 0.6-1 units/ml 4hrs after LMWH dose given Monitor platelets by anticoagulation protocol: Yes   Plan:  1) Lovenox 95mg  sq q12 2) CBC at least every 3 days  Deboraha Sprang 02/23/2017,4:23 PM

## 2017-02-23 NOTE — Progress Notes (Signed)
Critical findings CT :  Acute pulmonary embolism within the RIGHT lower lobe pulmonary artery which is occlusive. Overall clot burden is mild to moderate. No evidence of RIGHT ventricular strain. 2. Interval increase and RIGHT infrahilar mass with obstruction of the RIGHT lower bronchus. 3. Bulky hilar adenopathy on LEFT and RIGHT is similar as well as mediastinal adenopathy. 4. Individual bilateral nodules are similar in size. 5. Increase RIGHT infrahilar and RIGHT middle lobe mass lesions. 6. Overall evidence of progression of pulmonary metastatic disease.  Discussed with Dr Marin Olp oncology on call who will forward her name to primary oncologist for consult/recommendations regarding future therapies. May want to consider pulmonology consult for stent?  Xarelto changed to Lovenox per pharmacy  Of note office visit to oncology progress note indicates end-of-life discussion approached and patient not ready to discuss end-of-life. Notes also indicate an tentative therapy non-curative/palliative and this too discussed with the patient.  Santiago Glad m. Patrici Minnis, NP

## 2017-02-23 NOTE — ED Notes (Signed)
Patient's daughter back up to the desk.  Frustrated with delays.  This RN apologized and explained that she would receive additional pain medication upon rooming, which patient is next to be roomed, family made aware of that.

## 2017-02-23 NOTE — ED Provider Notes (Addendum)
Medical screening examination/treatment/procedure(s) were conducted as a shared visit with non-physician practitioner(s) and myself.  I personally evaluated the patient during the encounter.   EKG Interpretation  Date/Time:  Saturday February 23 2017 06:53:05 EDT Ventricular Rate:  86 PR Interval:  166 QRS Duration: 101 QT Interval:  372 QTC Calculation: 445 R Axis:   57 Text Interpretation:  Atrial fibrillation RSR' in V1 or V2, right VCD or RVH Confirmed by Pryor Curia [phone]) on 02/23/2017 7:00:40 AM      Pt is a 46 y.o. female with history of breast cancer with metastasis to the lungs just recently diagnosed in May 2018 with also new onset CHF with EF 25%, new onset atrial fibrillation on Xarelto who presents emergency department with a week of central chest pain and shortness of breath. Was just admitted to the hospital for similar symptoms and had negative venous Doppler of her left lower extremity and a negative VQ scan. States she was seen by cardiology and was started on Lasix which was helping her symptoms but now she feels like she is getting worse. No fevers or cough. On exam, patient is in atrial fibrillation, mildly tachypneic, not hypoxic. Her lungs are clear to auscultation. She has no peripheral edema that I see on exam and no JVD but she does seem like she is short of breath and is speaking short sentences. Her BNP is mildly elevated from baseline. She states that she feels like this is her heart failure. She denies history of PE or DVT and reports compliance with her Xarelto. We have discussed that she is at risk for PE given her history of cancer but given her recent negative workup she would like to hold off on repeating this testing. We have discussed with cardiology to evaluate the patient for possible admission/further disposition.   Furkan Keenum, Delice Bison, DO 02/23/17 0758   8:20 AM Cardiology mid-level provider has seen the patient. She will discuss with her attending but she  states Dr. Johnsie Cancel recommends medicine admission for age and chest pain. She states that they do not feel this is ACS as it seems atypical. They also state that they do not think she is volume overloaded and do not think this is CHF. I have asked for her to please have her attending come see the patient. She states she was concerned the patient seemed encephalopathic. I have evaluated patient multiple times and currently she is oriented 3 and her daughter at bedside who has been with her the entire time does not feel she is confused. She is not hypoxic currently but does state she has had a dry cough for the past week. She denies any fever to me. She has not had any coughing while here in the emergency department. No fever, no leukocytosis. Chest x-ray shows no pneumonia. Given progression of symptoms with recent diagnosis of breast cancer, will obtain a CT of her chest with contrast to further evaluate the cause of her symptoms today. She states she can receive IV contrast if she is premedicated first. Will give Solu-Cortef and Benadryl. She did recently have a negative VQ scan and a negative venous Doppler of her left lower extremity in June 2018. Will discuss with medicine for admission per cardiology request.   8:25 AM  Dr. Johnsie Cancel has seen the patient and does not feel she is encephalopathic. He recommends medicine admission for pain control, further workup for her metastatic disease.  I have ordered a CT scan of her chest for further  evaluation to see if there is any edema, possible pneumonia or pulmonary embolus that has not previously been seen on VQ scan or chest x-ray. Patient continues to go in and out of atrial fibrillation. Cardiology will continue working on rate control.   8:37 AM Discussed patient's case with Dyanne Carrel, NP with hospitalist service.  I have recommended admission to medicine per the cardiologist's request and patient (and family if present) agree with this plan. Admitting team  will place admission orders.   I reviewed all nursing notes, vitals, pertinent previous records, EKGs, lab and urine results, imaging (as available).      Iveliz Garay, Delice Bison, DO 02/23/17 [phone]

## 2017-02-23 NOTE — Consult Note (Addendum)
Cardiology Consultation:   Patient ID: Cindy Bradshaw; [id number]; 1971/07/10   Admit date: 02/23/2017 Date of Consult: 02/23/2017  Primary Care Provider: [name], MD Primary Cardiologist: Dr. Johnsie Cancel  Patient Profile:   Cindy Bradshaw is a 46 y.o. female with a hx of breast cancer dx 2016 with metastasis to lungs found 11/2016, normal coronaries 01/2016, asthma, GERD, recently diagnosed cardiomyopathy with EF 25-30% felt likely due to adriamycin +/- tachy-induced from atrial fib RVR/atrial flutter whom we are asked to see by Dr. Leonides Schanz for chest pain.  History of Present Illness:   She has h/o prior echo 09/2015 with normal EF, and admission in 01/2017 for chest pain with abnormal nuc and subsequent cardiac cath 01/23/17 with normal cors and normal LV function (suspected false-positive nuc). She was found to have breast CA in 08/2015 and underwent chemo with dose dense Adriamycin and Cytoxan x 4 cycles and then Carbo/Taxol x 3 cycles but did not tolerate Carbo and that was stopped after cycle #3 and had 3 additional cycles of Taxol alone.  No repeat echo was done. She also was in a clinical immunotherapy trial with Pembrolizumab.  She had adjuvant radiation 03/2016 - 05/2016. In 11/2016, she was found to have metastatic disease to the lungs and bone. Started Chemo 12/2016 through recently with Xeloda but unfortunately disease has progressed and plan was to start Eribulin on 6/11. Last CT chest 01/2017 showed worsened tumor burden with significant abnormal nodal enlargement and enlarged pulmonary nodules and masses. She presented for port a cath on 02/11/17 but was found to have afib with RVR on arrival and was sent to the ER. She was also found to go in and out of atrial flutter was well, with reference to evidence of paroxymal atrial flutter with RVR on EKGs in May 2018. Diflucan was stopped and she was placed on amiodarone. Xarelto was added. VQ scan was low probability for PE. 2D echo 02/12/17  showed EF 25-30%, mild MR, mod LAE, mild RAE. During that admission she also had sharp CP felt due to GERD. She saw cardio-onc CHF clinic on 02/21/17 at which time her Reds Vest reading was 40 (normal 20-35% per Bensimhon's note) although she did not appear overtly volume overloaded. It was unclear if lung mets were altering her vest reading. She was given Lasix 40mg  IV x 1, 32meq of potassium and started on maintenance Lasix 40mg  daily. Close f/u with oncology was strongly encouraged. Severe cough has been noted for the last several months.  History taking is limited due to the patient's grogginess. She falls asleep frequently during the interview and requires re-awakening, then is able to answer questions. She reports chest pain, constant since yesterday, unrelenting, not worse with anything in particular and nothing giving relief. She feels SOB has not improved with recent Lasix. She also reports significant bilateral leg cramps and numbness on the bottom of her feet. She had followed her temp at home and it was 100 yesterday with general malaise. Severe cough continues. She reports she had some edema but no significant LEE noted on exam at present time. CXR today shows bilateral pulm mets seen, no superimposed focal airspace consolidation, no pleural effusion, mild cardiomegaly. Labs show decreasing Na from 135->132, K 3.4, Cr 1.04 (prev 0.6-0.8), BNP 116, troponin negative, WBC 9.3k, Hgb 10.1, Plt 427. Prior albumin last admission was 2.5 and LFTs were elevated with AST 48, ALT 92. Initial EKG shows HR 86, initial temp 99.7. HR did go up to  around 117 at one point. She has had multiple coughing spells, one of which put her back into NSR briefly. She was given 40mg  IV Lasix x 1 and Lopressor 5mg  IV x 1 with resultant drop her in BP. Last dose of fentanyl was last night. She is also on low dose IV fluids at 20/hr.   Past Medical History:  Diagnosis Date  . Arthritis   . Asthma   . Back disorder     degenerative disk disease  . Breast cancer ([place])   . Cancer ([place])   . Cardiomyopathy ([place])    a. Dx 02/2017 - EF 25-30%, felt possibly due to adriamycin +/- tachy mediated from atrial fib/flutter.  Marland Kitchen GERD (gastroesophageal reflux disease)   . Mammogram abnormal 08/24/15   first  . Migraine   . Normal coronary arteries    01/2016 cath  . PAF (paroxysmal atrial fibrillation) ([place])   . Pap smear for cervical cancer screening 08/12/15  . Paroxysmal atrial flutter [facility])     Past Surgical History:  Procedure Laterality Date  . BREAST LUMPECTOMY WITH RADIOACTIVE SEED AND SENTINEL LYMPH NODE BIOPSY Left 02/13/2016   Procedure: BREAST LUMPECTOMY WITH RADIOACTIVE SEED AND SENTINEL LYMPH NODE BIOPSY;  Surgeon: Excell Seltzer, MD;  Location: [place];  Service: General;  Laterality: Left;  . BREAST REDUCTION SURGERY Bilateral 02/21/2016   Procedure: MAMMARY REDUCTION  (BREAST)/Oncoplastic breast reconstruction;  Surgeon: Irene Limbo, MD;  Location: [place];  Service: Plastics;  Laterality: Bilateral;  . CARDIAC CATHETERIZATION [name] 01/24/2016   Procedure: Left Heart Cath and Coronary Angiography;  Surgeon: Leonie Man, MD;  Location: [place] [facility];  Service: Cardiovascular;  Laterality: [name];  . CESAREAN SECTION    . CHOLECYSTECTOMY    . IR FLUORO GUIDE CV LINE RIGHT  02/11/2017  . IR US GUIDE VASC ACCESS RIGHT  02/11/2017  . PERIPHERAL VASCULAR CATHETERIZATION Right 02/13/2016   Procedure: PORTA CATH REMOVAL;  Surgeon: Excell Seltzer, MD;  Location: [place];  Service: General;  Laterality: Right;  . PORTACATH PLACEMENT [name] 09/12/2015   Procedure: INSERTION PORT-A-CATH;  Surgeon: Excell Seltzer, MD;  Location: [facility];  Service: General;  Laterality: [name];     Inpatient Medications: Scheduled Meds:  Continuous Infusions: . sodium chloride 20 mL/hr at 02/23/17 0601   PRN Meds: fentaNYL (SUBLIMAZE) injection  Allergies:      Allergies  Allergen Reactions  . Dilaudid [Hydromorphone Hcl] Itching  . Ivp Dye [Iodinated Diagnostic Agents] Itching  . Percocet [Oxycodone-Acetaminophen] Itching  . Tape Itching and Rash  . Toradol [Ketorolac Tromethamine] Itching    Social History:   Social History   Social History  . Marital status: Legally Separated    Spouse name: Chrissie Noa  . Number of children: 2  . Years of education: [name]   Occupational History  . Web designer    Social History Main Topics  . Smoking status: Never Smoker  . Smokeless tobacco: Never Used  . Alcohol use 0.0 oz/week     Comment: occassional glass of wine  . Drug use: No  . Sexual activity: Yes   Other Topics Concern  . Not on file   Social History Narrative   ** Merged History Encounter **       First MP age 18.  LMP 08/26/15.     2 children carried to term.  Age at first live birth 31   H/o oral contrasception use       Family  History:   The patient's family history includes Allergies in her daughter; Asthma in her daughter; Bone cancer in her maternal uncle; Breast cancer in her maternal grandmother; Cancer in her cousin, cousin, cousin, and paternal grandfather; Cirrhosis in her maternal grandfather; Congestive Heart Failure in her father; Eczema in her father; Heart Problems in her maternal grandfather; Lung cancer in her other; Lung cancer (age of onset: 24) in her mother; Other in her mother; Stroke in her father.  ROS:  Please see the history of present illness.  All other ROS reviewed and negative.     Physical Exam/Data:   Vitals:   02/23/17 0700 02/23/17 0715 02/23/17 0730 02/23/17 0745  BP: (!) 88/58 119/64 112/78 120/73  Pulse: 85 86 98 86  Resp: 19 (!) 22 (!) 26 (!) 21  Temp:      TempSrc:      SpO2: 93% 92% 97% 94%   No intake or output data in the 24 hours ending 02/23/17 0811 There were no vitals filed for this visit. There is no height or weight on file to calculate BMI.  General: Well  developed, well nourished obese AAF in no acute distress. Head: Normocephalic, atraumatic, sclera non-icteric, no xanthomas, nares are without discharge. Neck: Negative for carotid bruits. JVD not elevated. Lungs: Breathing is unlabored but lung exam is difficult for patient as even subtle inspiration produces spastic and long-lasting cough, no overt wheezing or rales Heart: Irregular, rate controlled, with S1 S2. No murmurs, rubs, or gallops appreciated. Abdomen: Soft, non-tender, non-distended with normoactive bowel sounds. No hepatomegaly. No rebound/guarding. No obvious abdominal masses. Msk:  Strength and tone appear normal for age. Extremities: No clubbing or cyanosis. No edema.  Distal pedal pulses are 2+ and equal bilaterally. Neuro: Groggy but arousable, no focal deficit Psych:Flat affect  EKG:  The EKG was personally reviewed and demonstrates coarse atrial fib 86bpm, RSR V1, no acute EKG changes other than rhythm  Relevant CV Studies: Echocardiogram 02/12/17: Study Conclusions - Left ventricle: The cavity size was severely dilated. Wall thickness was normal. Systolic function was severely reduced. The estimated ejection fraction was in the range of 25% to 30%. Diffuse hypokinesis. The study is not technically sufficient to allow evaluation of LV diastolic function. - Mitral valve: There was mild regurgitation. - Left atrium: The atrium was moderately dilated. - Right atrium: The atrium was mildly dilated.  Echocardiogram 09/13/15: Study Conclusions - Left ventricle: The cavity size was normal. There was mild focal basal hypertrophy of the septum. Systolic function was normal. The estimated ejection fraction was in the range of 55% to 60%. Wall motion was normal; there were no regional wall motion abnormalities. Global longitudinal strain: -18.4% There was no evidence of elevated ventricular filling pressure by Doppler parameters.   Left heart cath  01/24/16: 1. Angiographically normal coronary arteries 2. The left ventricular systolic function is normal. Essentially normal coronary arteries with no significant coronary disease noted to explain abnormal stress test.  Suspect breast attenuation is the explanation for the false-positive stress test. I suspect noncardiac chest pain  Laboratory Data:  Chemistry Recent Labs Lab 02/18/17 0840 02/22/17 2336  NA 135* 132*  K 4.1 3.4*  CL  --  100*  CO2 25 23  GLUCOSE 99 119*  BUN 30.7* 16  CREATININE 0.8 1.04*  CALCIUM 9.7 9.2  GFRNONAA  --  >60  GFRAA  --  >60  ANIONGAP 7 9     Recent Labs Lab 02/18/17 0840  PROT 6.4  ALBUMIN 2.5*  AST 48*  ALT 92*  ALKPHOS 112  BILITOT 0.43   Hematology Recent Labs Lab 02/18/17 0839 02/22/17 2336  WBC 5.4 9.3  RBC 3.93 3.62*  HGB 11.3* 10.1*  HCT 34.7* 31.5*  MCV 88.2 87.0  MCH 28.7 27.9  MCHC 32.5 32.1  RDW 21.3* 19.5*  PLT 367 427*   Cardiac EnzymesNo results for input(s): TROPONINI in the last 168 hours.  Recent Labs Lab 02/23/17 0006  TROPIPOC 0.02    BNP Recent Labs Lab 02/22/17 2336  BNP 116.2*    DDimer No results for input(s): DDIMER in the last 168 hours.  Radiology/Studies:  Dg Chest 2 View  Result Date: 02/23/2017 CLINICAL DATA:  Acute onset of cough, shortness of breath and generalized chest pain. Nausea and vomiting. Initial encounter. EXAM: CHEST  2 VIEW COMPARISON:  Chest radiograph performed 02/21/2017 FINDINGS: The lungs are well-aerated. The patient's bilateral pulmonary metastases are again noted. No definite superimposed pneumonia is seen. There is no evidence of pleural effusion or pneumothorax. A right PICC is noted ending about the mid to distal SVC. The heart is mildly enlarged. No acute osseous abnormalities are seen. Clips are noted at the left axilla. IMPRESSION: Bilateral pulmonary metastases again noted. No superimposed focal airspace consolidation seen. Mild cardiomegaly. Electronically  Signed   By: Garald Balding M.D.   On: 02/23/2017 00:14   Dg Chest 2 View  Result Date: 02/21/2017 CLINICAL DATA:  Shortness of breath and dry cough for 4 days. EXAM: CHEST  2 VIEW COMPARISON:  February 12, 2017, CT chest Jan 30, 2017 FINDINGS: The heart size and mediastinal contours are stable. The heart size is enlarged. Bilateral lung masses are identified consistent with patient's known metastatic disease as seen on prior chest CT. There is minimal left pleural effusion. There is no pulmonary edema. The visualized skeletal structures are stable. IMPRESSION: Bilateral lung masses consistent with patient's known metastatic disease. Minimal left pleural effusion. Electronically Signed   By: Abelardo Diesel M.D.   On: 02/21/2017 16:28    Assessment and Plan:  Cindy Bradshaw is a 46 y.o. female with a hx of breast cancer dx 2016 with metastasis to lungs found 11/2016, normal coronaries 01/2016, asthma, GERD, recently diagnosed cardiomyopathy with EF 25-30% felt likely due to adriamycin +/- tachy-induced from atrial fib RVR/atrial flutter who presented back to [facility] with chest pain and SOB. Also noted to be in generally rate controlled PAF in and out on telemetry as well as lethargic.  1. Chest pain/dyspnea with no objective evidence for ischemia 2. Worsened hyponatremia, AKI, and hypokalemia 3. Metastatic breast CA to lungs with significant tumor burden in chest 4. Presumed NICM EF 25-30% dx 02/2017 felt either r/t adriamycin or tachy-induced 5. Paroxysmal atrial fib/flutter with evidence of recurrence on telemetry, though not particularly fast 6. Grogginess - nurse confirms patient has been this way since he had her, but arouses easily 7. Low grade temperature elevation  With completely normal coronaries in 01/2016 and negative troponin in ED despite >12 hours of constant chest pain, doubt pain is related to ACS or ischemia. I would be concerned for cancer-related pain given known tumor burden in the chest  from back in 01/2017. No leukocytosis but she has been treated with chemotherapy and should be considered immunosuppressed. Reports low grade temp elevation at home to 100. She had similar ED visit in 11/2016 with chest pain and fever at which time adenopathy was first demonstrated. With regards to AF, HR has  run from 80s-low 100s, occasionally increasing further coinciding with coughing fit while in the room. From a volume standpoint, there was no edema on CXR, BNP only 112 (out of proportion to symptoms), Cr has risen and sodium has fallen lower, suggestive to me that she may actually be on the drier side. She also falls asleep frequently during exam but arouses easily (last dose of fentanyl 2342). She also complains of worsening leg cramps and what sounds like new neuropathy of her feet. Some of the cramping may be related to hypokalemia. Will give 23meq x 1 now. Will review further management with Dr. Johnsie Cancel. Per preliminary review, plan LE duplex.   Signed, Charlie Pitter, PA-C  02/23/2017 8:11 AM  Patient examined chart reviewed. Atypical constant SSCP with negative troponin and no acute ST changes. Bilateral LE pain with good pulses. Altered MS with known metastatic breast CA. She is not volume overloaded EF 25-30% and CXR mets but no CHF. Exam with no rales no murmur Pain to palpation both legs. She has had cath with normal coronary arteries in May 2017 No further cardiac w/u anticipated Low grade temp , MS and ? Cancer related pain per IM  Jenkins Rouge

## 2017-02-23 NOTE — ED Notes (Signed)
Pt back in afib

## 2017-02-23 NOTE — H&P (Signed)
History and Physical    Cindy Bradshaw PYP:[id number] DOB: 10-11-1970 DOA: 02/23/2017  PCP: [name], MD Patient coming from: home  Chief Complaint: chest pain/sob  HPI: Cindy Bradshaw is a 46 y.o. female with medical history significant for breast cancer with metastasis to the lungs recently diagnosed with new onset CHF and A. fib on Xarelto presents to the emergency department chief complaint chest pain shortness of breath. Evaluated by cardiology in the ED who opined not likely ACS or heart failure recommended medical admission for further workup of metastases  Information is obtained from the patient and the daughter who is at bedside as well as the chart. She reports she was admitted 3 weeks ago with similar symptoms. Patient reports she was discharged on Lasix and was feeling better until yesterday when she developed chest pain. She describes the pain as an ache constant not influenced by movement. She states the pain is located bilateral anterior chest as well as posterior. She denies any radiation to the shoulder or jaw. Associated symptoms include worsening cough with thick clear sputum and shortness of breath. She also reports some max temp of 100 yesterday. She denies headache dizziness syncope or near-syncope. She denies abdominal pain nausea vomiting diarrhea constipation. She denies dysuria hematuria frequency or urgency.  ED Course: The emergency department she's afebrile hemodynamically stable. Telemetry reveals she is in an out of A. fib. She is provided with 40 mg of Lasix IV as well as metoprolol. At time of admission reports breathing better.  Review of Systems: As per HPI otherwise all other systems reviewed and are negative.   Ambulatory Status: Relates independently  Past Medical History:  Diagnosis Date  . Arthritis   . Asthma   . Back disorder    degenerative disk disease  . Breast cancer ([place])   . Cancer ([place])   . Cardiomyopathy ([place])    a. Dx  02/2017 - EF 25-30%, felt possibly due to adriamycin +/- tachy mediated from atrial fib/flutter.  Mar[place] Kitchen GERD (gastroesophageal reflux disease)   . Mammogram abnormal 08/24/15   first  . Migraine   . Normal coronary arteries    01/2016 cath  . PAF (paroxysmal atrial fibrillation) ([place])   . Pap smear for cervical cancer screening 08/12/15  . Paroxysmal atrial flutter [facility])     Past Surgical History:  Procedure Laterality Date  . BREAST LUMPECTOMY WITH RADIOACTIVE SEED AND SENTINEL LYMPH NODE BIOPSY Left 02/13/2016   Procedure: BREAST LUMPECTOMY WITH RADIOACTIVE SEED AND SENTINEL LYMPH NODE BIOPSY;  Surgeon: Excell Seltzer, MD;  Location: [place];  Service: General;  Laterality: Left;  . BREAST REDUCTION SURGERY Bilateral 02/21/2016   Procedure: MAMMARY REDUCTION  (BREAST)/Oncoplastic breast reconstruction;  Surgeon: Irene Limbo, MD;  Location: [place];  Service: Plastics;  Laterality: Bilateral;  . CARDIAC CATHETERIZATION [name] 01/24/2016   Procedure: Left Heart Cath and Coronary Angiography;  Surgeon: Leonie Man, MD;  Location: [facility];  Service: Cardiovascular;  Laterality: [name];  . CESAREAN SECTION    . CHOLECYSTECTOMY    . IR FLUORO GUIDE CV LINE RIGHT  02/11/2017  . IR US GUIDE VASC ACCESS RIGHT  02/11/2017  . PERIPHERAL VASCULAR CATHETERIZATION Right 02/13/2016   Procedure: PORTA CATH REMOVAL;  Surgeon: Excell Seltzer, MD;  Location: [place];  Service: General;  Laterality: Right;  . PORTACATH PLACEMENT [name] 09/12/2015   Procedure: INSERTION PORT-A-CATH;  Surgeon: Excell Seltzer, MD;  Location: [facility];  Service: General;  Laterality: [name];    Social History   Social History  . Marital status: Legally Separated    Spouse name: Cindy Bradshaw  . Number of children: 2  . Years of education: [name]   Occupational History  . Web designer    Social History Main Topics  . Smoking status: Never Smoker  .  Smokeless tobacco: Never Used  . Alcohol use 0.0 oz/week     Comment: occassional glass of wine  . Drug use: No  . Sexual activity: Yes   Other Topics Concern  . Not on file   Social History Narrative   ** Merged History Encounter **       First MP age 74.  LMP 08/26/15.     2 children carried to term.  Age at first live birth 78   H/o oral contrasception use       Allergies  Allergen Reactions  . Dilaudid [Hydromorphone Hcl] Itching  . Ivp Dye [Iodinated Diagnostic Agents] Itching  . Percocet [Oxycodone-Acetaminophen] Itching  . Tape Itching and Rash  . Toradol [Ketorolac Tromethamine] Itching    Family History  Problem Relation Age of Onset  . Lung cancer Mother 16       2 different types of lung cancer; metastasis to brain; smoker  . Other Mother        hx of hysterectomy for unspecified reason  . Bone cancer Maternal Uncle        dx. early 5s  . Breast cancer Maternal Grandmother        dx. early 62s, s/p mastectomy  . Cancer Paternal Grandfather        unspecified type of cancer, dx. late 30s  . Congestive Heart Failure Father        smoker  . Stroke Father   . Eczema Father   . Cirrhosis Maternal Grandfather   . Heart Problems Maternal Grandfather   . Asthma Daughter   . Allergies Daughter        hives  . Lung cancer Other        (maternal great uncle; MGM's brother); had a coal stove  . Cancer Cousin        unspecified type; d. early age (paternal first cousin once-removed)  . Cancer Cousin        dx. as a kid; in remission today; (paternal 2nd cousin)  . Cancer Cousin        unspecified type; d. early 38s; (maternal 1st cousin)    Prior to Admission medications   Medication Sig Start Date End Date Taking? Authorizing Provider  rivaroxaban (XARELTO) 20 MG TABS tablet Take 1 tablet (20 mg total) by mouth daily with supper. 02/14/17  Yes Gherghe, Vella Redhead, MD  albuterol (PROVENTIL HFA;VENTOLIN HFA) 108 (90 Base) MCG/ACT inhaler Inhale 1-2 puffs into  the lungs every 6 (six) hours as needed for wheezing or shortness of breath. 07/24/16   Waynetta Pean, PA-C  albuterol (PROVENTIL) (2.5 MG/3ML) 0.083% nebulizer solution Take 2.5 mg by nebulization every 6 (six) hours as needed for wheezing or shortness of breath.    [provider]  amiodarone (PACERONE) 400 MG tablet Take 400 mg by mouth 2 (two) times daily.    [provider]  carvedilol (COREG) 6.25 MG tablet Take 1 tablet (6.25 mg total) by mouth 2 (two) times daily with a meal. 02/18/17   Causey, Charlestine Massed, NP  cholecalciferol (VITAMIN D) 1000 units tablet Take 1,000 Units by mouth daily.    [provider]  dexamethasone (DECADRON) 4 MG tablet Take 1 tablet (4 mg total) by mouth daily. 02/05/17   Gardenia Phlegm, NP  ferrous sulfate 325 (65 FE) MG EC tablet Take 325 mg by mouth 3 (three) times daily with meals.    [provider]  furosemide (LASIX) 40 MG tablet Take 1 tablet (40 mg total) by mouth daily. 02/21/17 05/22/17  Bensimhon, Shaune Pascal, MD  losartan (COZAAR) 25 MG tablet Take 1 tablet (25 mg total) by mouth daily. 02/15/17   Caren Griffins, MD  Melatonin 10 MG TABS Take 10 mg by mouth at bedtime.     [provider]  morphine (MSIR) 30 MG tablet Take 30 mg by mouth every 4 (four) hours as needed for severe pain.    [provider]  ondansetron (ZOFRAN) 8 MG tablet Take 1 tablet (8 mg total) by mouth 2 (two) times daily as needed (Nausea or vomiting). 02/14/17   Caren Griffins, MD  potassium chloride SA (K-DUR,KLOR-CON) 20 MEQ tablet Take 1 tablet (20 mEq total) by mouth daily. 02/21/17   Bensimhon, Shaune Pascal, MD  senna (SENOKOT) 8.6 MG TABS tablet Take 1 tablet by mouth daily as needed for mild constipation.    [provider]  tobramycin-dexamethasone [facility]) ophthalmic solution Place 2 drops into both eyes every 4 (four) hours while awake. 02/18/17   Gardenia Phlegm, NP  traMADol (ULTRAM) 50 MG  tablet Take 1 tablet (50 mg total) by mouth every 6 (six) hours as needed for moderate pain (or Headache unrelieved by tylenol). 02/14/17   Caren Griffins, MD    Physical Exam: Vitals:   02/23/17 0700 02/23/17 0715 02/23/17 0730 02/23/17 0745  BP: (!) 88/58 119/64 112/78 120/73  Pulse: 85 86 98 86  Resp: 19 (!) 22 (!) 26 (!) 21  Temp:      TempSrc:      SpO2: 93% 92% 97% 94%     General:  Appears calm and comfortable Eyes:  PERRL, EOMI, normal lids, iris ENT:  grossly normal hearing, lips & tongue, mmm Neck:  no LAD, masses or thyromegaly Cardiovascular:  RRR, no m/r/g. No LE edema.  Respiratory:  CTA bilaterally, no w/r/r. Normal respiratory effort. Abdomen:  soft, ntnd, NABS Skin:  no rash or induration seen on limited exam Musculoskeletal:  grossly normal tone BUE/BLE, good ROM, no bony abnormality Psychiatric:  grossly normal mood and affect, speech fluent and appropriate, AOx3 Neurologic:  CN 2-12 grossly intact, moves all extremities in coordinated fashion, sensation intact  Labs on Admission: I have personally reviewed following labs and imaging studies  CBC:  Recent Labs Lab 02/18/17 0839 02/22/17 2336  WBC 5.4 9.3  NEUTROABS 2.6  --   HGB 11.3* 10.1*  HCT 34.7* 31.5*  MCV 88.2 87.0  PLT 367 867*   Basic Metabolic Panel:  Recent Labs Lab 02/18/17 0840 02/18/17 0955 02/22/17 2336  NA 135*  --  132*  K 4.1  --  3.4*  CL  --   --  100*  CO2 25  --  23  GLUCOSE 99  --  119*  BUN 30.7*  --  16  CREATININE 0.8  --  1.04*  CALCIUM 9.7  --  9.2  MG  --  1.9  --    GFR: Estimated Creatinine Clearance: 78.6 mL/min (A) (by C-G formula based on SCr of 1.04 mg/dL (H)). Liver Function Tests:  Recent Labs Lab 02/18/17 0840  AST 48*  ALT 92*  ALKPHOS 112  BILITOT 0.43  PROT 6.4  ALBUMIN 2.5*   No results for input(s): LIPASE, AMYLASE in the last 168 hours. No results for input(s): AMMONIA in the last 168 hours. Coagulation Profile: No results for  input(s): INR, PROTIME in the last 168 hours. Cardiac Enzymes: No results for input(s): CKTOTAL, CKMB, CKMBINDEX, TROPONINI in the last 168 hours. BNP (last 3 results) No results for input(s): PROBNP in the last 8760 hours. HbA1C: No results for input(s): HGBA1C in the last 72 hours. CBG: No results for input(s): GLUCAP in the last 168 hours. Lipid Profile: No results for input(s): CHOL, HDL, LDLCALC, TRIG, CHOLHDL, LDLDIRECT in the last 72 hours. Thyroid Function Tests: No results for input(s): TSH, T4TOTAL, FREET4, T3FREE, THYROIDAB in the last 72 hours. Anemia Panel: No results for input(s): VITAMINB12, FOLATE, FERRITIN, TIBC, IRON, RETICCTPCT in the last 72 hours. Urine analysis:    Component Value Date/Time   COLORURINE YELLOW 07/03/2016 0557   APPEARANCEUR CLOUDY (A) 07/03/2016 0557   LABSPEC 1.021 07/03/2016 0557   LABSPEC 1.010 04/10/2016 1224   PHURINE 6.0 07/03/2016 0557   GLUCOSEU NEGATIVE 07/03/2016 0557   GLUCOSEU Negative 04/10/2016 1224   HGBUR SMALL (A) 07/03/2016 0557   BILIRUBINUR NEGATIVE 07/03/2016 0557   BILIRUBINUR Negative 04/10/2016 [employer] 07/03/2016 0557   PROTEINUR NEGATIVE 07/03/2016 0557   UROBILINOGEN 0.2 04/10/2016 1224   NITRITE NEGATIVE 07/03/2016 0557   LEUKOCYTESUR NEGATIVE 07/03/2016 0557   LEUKOCYTESUR Negative 04/10/2016 1224    Creatinine Clearance: Estimated Creatinine Clearance: 78.6 mL/min (A) (by C-G formula based on SCr of 1.04 mg/dL (H)).  Sepsis Labs: @LABRCNTIP (procalcitonin:4,lacticidven:4) ) Recent Results (from the past 240 hour(s))  TECHNOLOGIST REVIEW     Status: None   Collection Time: 02/18/17  8:39 AM  Result Value Ref Range Status   Technologist Review 2% metamyelocytes, 1% myelocyte.  Final     Radiological Exams on Admission: Dg Chest 2 View  Result Date: 02/23/2017 CLINICAL DATA:  Acute onset of cough, shortness of breath and generalized chest pain. Nausea and vomiting. Initial encounter.  EXAM: CHEST  2 VIEW COMPARISON:  Chest radiograph performed 02/21/2017 FINDINGS: The lungs are well-aerated. The patient's bilateral pulmonary metastases are again noted. No definite superimposed pneumonia is seen. There is no evidence of pleural effusion or pneumothorax. A right PICC is noted ending about the mid to distal SVC. The heart is mildly enlarged. No acute osseous abnormalities are seen. Clips are noted at the left axilla. IMPRESSION: Bilateral pulmonary metastases again noted. No superimposed focal airspace consolidation seen. Mild cardiomegaly. Electronically Signed   By: Garald Balding M.D.   On: 02/23/2017 00:14   Dg Chest 2 View  Result Date: 02/21/2017 CLINICAL DATA:  Shortness of breath and dry cough for 4 days. EXAM: CHEST  2 VIEW COMPARISON:  February 12, 2017, CT chest Jan 30, 2017 FINDINGS: The heart size and mediastinal contours are stable. The heart size is enlarged. Bilateral lung masses are identified consistent with patient's known metastatic disease as seen on prior chest CT. There is minimal left pleural effusion. There is no pulmonary edema. The visualized skeletal structures are stable. IMPRESSION: Bilateral lung masses consistent with patient's known metastatic disease. Minimal left pleural effusion. Electronically Signed   By: Abelardo Diesel M.D.   On: 02/21/2017 16:28    EKG: Independently reviewed. Atrial fibrillation RSR' in V1 or V2, right VCD or RVH  Assessment/Plan Principal Problem:   Chest pain Active Problems:   Breast cancer of  upper-outer quadrant of left female breast ([place])   Chemotherapy-induced peripheral neuropathy ([facility])   A-fib ([place])   Dyspnea   Cardiomyopathy ([place])   #1. Chest pain/dyspnea. Score 5. Admission 2 weeks ago for same. Recent diagnosis of lung metastases, A. fib and CHF related to chemotherapy. Pain likely related to cancer given worsening tumor burden noted on imaging.  She was started on amiodarone Lasix and Xarelto. Recent VQ scan and  Doppler negative for PE or DVT at previous admission. Initial troponin negative EKG without acute changes, BNP only mildly elevated. Chest x-ray shows no pneumonia evaluated by cardiology who opined chest pain and dyspnea with no objective evidence for ischemia. Of note in May 2018 patient with abnormal new chest test underwent cardiac cath with normal cors and normal LV function -Admit to telemetry -Cycle troponin -Serial EKG -CT chest -Pain management -Appreciate cardiology input  #2. A. Fib. [place] score 2. Recently diagnosed. On Xarelto. Only fair rate control. Home medications include Coreg Cozaar -Continue Coreg -Hold Cozaar for now for slightly soft blood pressure -Monitor  #3. Cardiomyopathy. Recent echo with an EF of 25%.likely clearly related to chemotherapy. She does not appear particularly volume overloaded. Recently prescribed 40 mg Lasix daily. Of note she was evaluated by cardio-onc last week and progress note indicates her Reds Vest reading was 40 (see Dr Bensimhon's note) and she did not appear overloaded.  -Beta blocker as noted above -We will continue IV Lasix twice a day -Daily weights -Intake and output - repeat potassium  #4. Breast cancer. Diagnosed in December 2016. Underwent chemotherapy and undergo immunotherapy trial as well as radiation. In March 2018 she was found to have metastatic disease to the lungs and bone. She started chemotherapy in April. Most recent CT showed worsening tumor burden with significant abnormal nodal enlargement and enlarged pulmonary nodules and masses. She presented for Port-A-Cath June 11 was found to have A. fib with RVR. -See above notes -Outpatient follow-up with oncology    DVT prophylaxis: xarelto  Code Status: full  Family Communication: daughter at bedside  Disposition Plan: home hopefully 24 36 hours. Consults called: [facility] cardiology  Admission status: obs    Radene Gunning MD [facility]  If 7PM-7AM, please  contact night-coverage [email or web address] Password [facility]  02/23/2017, 9:19 AM

## 2017-02-23 NOTE — ED Notes (Signed)
Patient's daughter up to desk.  States the pain medicine is wearing off and is stating her pain is now spreading to her other leg.  Will speak to triage RN about further POC.

## 2017-02-23 NOTE — ED Notes (Signed)
Nurse called lab to follow up on pt.'s pending BNP result , advised RN that they will add/run the test .

## 2017-02-23 NOTE — ED Provider Notes (Signed)
[facility] DEPT Provider Note   CSN: [id number] Arrival date & time: 02/22/17  2324     History   Chief Complaint Chief Complaint  Patient presents with  . Chest Pain  . Shortness of Breath  . Cough    HPI Cindy Bradshaw is a 46 y.o. female.  Patient with a history of breast CA metastatic to lung - diagnosed 01/2017 - GERD, atrial fibrillation, cardiomyopathy presents with SOB, chest pain and lower extremity swelling. She was seen and admitted 02/11/17 with new onset atrial fibrillation (felt secondary to new CHF), treated with Coreg and Amiodarone and discharged 02/14/17. She had hospital follow up with Dr. Haroldine Laws on 02/21/17 at which time he started her on Lasix. She returns tonight with chest pain that is new, increasing SOB and lower extremity swelling that is no better with Lasix. No fever. She has had one episode of post-tussive emesis. No abdominal pain or diarrhea.    The history is provided by the patient. No language interpreter was used.  Chest Pain   Associated symptoms include cough and shortness of breath. Pertinent negatives include no fever.  Shortness of Breath  Associated symptoms include cough, chest pain and leg swelling. Pertinent negatives include no fever.  Cough  Associated symptoms include chest pain and shortness of breath. Pertinent negatives include no chills.    Past Medical History:  Diagnosis Date  . Arthritis   . Asthma   . Back disorder    degenerative disk disease  . Breast cancer ([place])   . Cancer ([place])   . GERD (gastroesophageal reflux disease)   . Mammogram abnormal 08/24/15   first  . Migraine   . Pap smear for cervical cancer screening 08/12/15    Patient Active Problem List   Diagnosis Date Noted  . A-fib ([place]) 02/13/2017  . Metastatic breast cancer ([place])   . DCM (dilated cardiomyopathy) ([place])   . Atrial fibrillation with RVR ([place]) 02/11/2017  . Chest pain   . Adnexal cyst: Right per CT 12/09/2015 12/12/2015  .  Chemotherapy-induced peripheral neuropathy ([place]) 12/05/2015  . Normocytic normochromic anemia 09/19/2015  . Breast cancer of upper-outer quadrant of left female breast ([place]) 09/07/2015    Past Surgical History:  Procedure Laterality Date  . BREAST LUMPECTOMY WITH RADIOACTIVE SEED AND SENTINEL LYMPH NODE BIOPSY Left 02/13/2016   Procedure: BREAST LUMPECTOMY WITH RADIOACTIVE SEED AND SENTINEL LYMPH NODE BIOPSY;  Surgeon: Excell Seltzer, MD;  Location: [facility];  Service: General;  Laterality: Left;  . BREAST REDUCTION SURGERY Bilateral 02/21/2016   Procedure: MAMMARY REDUCTION  (BREAST)/Oncoplastic breast reconstruction;  Surgeon: Irene Limbo, MD;  Location: [facility];  Service: Plastics;  Laterality: Bilateral;  . CARDIAC CATHETERIZATION [name] 01/24/2016   Procedure: Left Heart Cath and Coronary Angiography;  Surgeon: Leonie Man, MD;  Location: [facility];  Service: Cardiovascular;  Laterality: [name];  . CESAREAN SECTION    . CHOLECYSTECTOMY    . IR FLUORO GUIDE CV LINE RIGHT  02/11/2017  . IR US GUIDE VASC ACCESS RIGHT  02/11/2017  . PERIPHERAL VASCULAR CATHETERIZATION Right 02/13/2016   Procedure: PORTA CATH REMOVAL;  Surgeon: Excell Seltzer, MD;  Location: [facility];  Service: General;  Laterality: Right;  . PORTACATH PLACEMENT [name] 09/12/2015   Procedure: INSERTION PORT-A-CATH;  Surgeon: Excell Seltzer, MD;  Location: [facility];  Service: General;  Laterality: [name];    OB History    Gravida Para Term Preterm AB Living   0 0  0 0 0     SAB TAB Ectopic Multiple Live Births   0 0 0           Home Medications    Prior to Admission medications   Medication Sig Start Date End Date Taking? Authorizing Provider  albuterol (PROVENTIL HFA;VENTOLIN HFA) 108 (90 Base) MCG/ACT inhaler Inhale 1-2 puffs into the lungs every 6 (six) hours as needed for wheezing or shortness of breath. 07/24/16   Waynetta Pean, PA-C  albuterol  (PROVENTIL) (2.5 MG/3ML) 0.083% nebulizer solution Take 2.5 mg by nebulization every 6 (six) hours as needed for wheezing or shortness of breath.    [provider]  amiodarone (PACERONE) 400 MG tablet Take 400 mg by mouth 2 (two) times daily.    [provider]  carvedilol (COREG) 6.25 MG tablet Take 1 tablet (6.25 mg total) by mouth 2 (two) times daily with a meal. 02/18/17   Causey, Charlestine Massed, NP  cholecalciferol (VITAMIN D) 1000 units tablet Take 1,000 Units by mouth daily.    [provider]  dexamethasone (DECADRON) 4 MG tablet Take 1 tablet (4 mg total) by mouth daily. 02/05/17   Gardenia Phlegm, NP  ferrous sulfate 325 (65 FE) MG EC tablet Take 325 mg by mouth 3 (three) times daily with meals.    [provider]  furosemide (LASIX) 40 MG tablet Take 1 tablet (40 mg total) by mouth daily. 02/21/17 05/22/17  Bensimhon, Shaune Pascal, MD  losartan (COZAAR) 25 MG tablet Take 1 tablet (25 mg total) by mouth daily. 02/15/17   Caren Griffins, MD  Melatonin 10 MG TABS Take 10 mg by mouth at bedtime.     [provider]  morphine (MSIR) 30 MG tablet Take 30 mg by mouth every 4 (four) hours as needed for severe pain.    [provider]  ondansetron (ZOFRAN) 8 MG tablet Take 1 tablet (8 mg total) by mouth 2 (two) times daily as needed (Nausea or vomiting). 02/14/17   Caren Griffins, MD  potassium chloride SA (K-DUR,KLOR-CON) 20 MEQ tablet Take 1 tablet (20 mEq total) by mouth daily. 02/21/17   Bensimhon, Shaune Pascal, MD  rivaroxaban (XARELTO) 20 MG TABS tablet Take 1 tablet (20 mg total) by mouth daily with supper. 02/14/17   Caren Griffins, MD  senna (SENOKOT) 8.6 MG TABS tablet Take 1 tablet by mouth daily as needed for mild constipation.    [provider]  tobramycin-dexamethasone [facility]) ophthalmic solution Place 2 drops into both eyes every 4 (four) hours while awake. 02/18/17   Gardenia Phlegm, NP  traMADol (ULTRAM)  50 MG tablet Take 1 tablet (50 mg total) by mouth every 6 (six) hours as needed for moderate pain (or Headache unrelieved by tylenol). 02/14/17   Caren Griffins, MD    Family History Family History  Problem Relation Age of Onset  . Lung cancer Mother 27       2 different types of lung cancer; metastasis to brain; smoker  . Other Mother        hx of hysterectomy for unspecified reason  . Bone cancer Maternal Uncle        dx. early 64s  . Breast cancer Maternal Grandmother        dx. early 41s, s/p mastectomy  . Cancer Paternal Grandfather        unspecified type of cancer, dx. late 17s  . Congestive Heart Failure Father  smoker  . Stroke Father   . Eczema Father   . Cirrhosis Maternal Grandfather   . Heart Problems Maternal Grandfather   . Asthma Daughter   . Allergies Daughter        hives  . Lung cancer Other        (maternal great uncle; MGM's brother); had a coal stove  . Cancer Cousin        unspecified type; d. early age (paternal first cousin once-removed)  . Cancer Cousin        dx. as a kid; in remission today; (paternal 2nd cousin)  . Cancer Cousin        unspecified type; d. early 69s; (maternal 1st cousin)    Social History Social History  Substance Use Topics  . Smoking status: Never Smoker  . Smokeless tobacco: Never Used  . Alcohol use 0.0 oz/week     Comment: occassional glass of wine     Allergies   Dilaudid [hydromorphone hcl]; Ivp dye [iodinated diagnostic agents]; Percocet [oxycodone-acetaminophen]; Tape; and Toradol [ketorolac tromethamine]   Review of Systems Review of Systems  Constitutional: Negative for chills and fever.  HENT: Negative.   Respiratory: Positive for cough and shortness of breath.   Cardiovascular: Positive for chest pain and leg swelling.  Gastrointestinal: Negative.   Musculoskeletal: Negative.   Skin: Negative.  Negative for color change.  Neurological: Negative.      Physical Exam Updated Vital Signs BP  (!) 112/58 (BP Location: Right Leg)   Pulse 98   Temp 99.7 F (37.6 C) (Oral)   Resp 16   SpO2 94%   Physical Exam  Constitutional: She is oriented to person, place, and time. She appears well-developed and well-nourished.  HENT:  Head: Normocephalic.  Neck: Normal range of motion. Neck supple.  Cardiovascular: Normal rate and regular rhythm.   Pulmonary/Chest: Effort normal and breath sounds normal. She has no wheezes. She has no rales.  Abdominal: Soft. Bowel sounds are normal. There is no tenderness. There is no rebound and no guarding.  Musculoskeletal: Normal range of motion.  No pretibial edema present.   Neurological: She is alert and oriented to person, place, and time.  Skin: Skin is warm and dry. No rash noted.  Psychiatric: She has a normal mood and affect.     ED Treatments / Results  Labs (all labs ordered are listed, but only abnormal results are displayed) Labs Reviewed  BASIC METABOLIC PANEL - Abnormal; Notable for the following:       Result Value   Sodium 132 (*)    Potassium 3.4 (*)    Chloride 100 (*)    Glucose, Bld 119 (*)    Creatinine, Ser 1.04 (*)    All other components within normal limits  CBC - Abnormal; Notable for the following:    RBC 3.62 (*)    Hemoglobin 10.1 (*)    HCT 31.5 (*)    RDW 19.5 (*)    Platelets 427 (*)    All other components within normal limits  BRAIN NATRIURETIC PEPTIDE  I-STAT TROPOININ, ED    EKG  EKG Interpretation  Date/Time:  Friday February 22 2017 23:30:08 EDT Ventricular Rate:  101 PR Interval:  166 QRS Duration: 82 QT Interval:  348 QTC Calculation: 451 R Axis:   55 Text Interpretation:  Sinus tachycardia Cannot rule out Anterior infarct , age undetermined Abnormal ECG No significant change since last tracing Confirmed by Ward, Cyril Mourning [phone]) on 02/23/2017 3:58:06 AM  Radiology Dg Chest 2 View  Result Date: 02/23/2017 CLINICAL DATA:  Acute onset of cough, shortness of breath and generalized  chest pain. Nausea and vomiting. Initial encounter. EXAM: CHEST  2 VIEW COMPARISON:  Chest radiograph performed 02/21/2017 FINDINGS: The lungs are well-aerated. The patient's bilateral pulmonary metastases are again noted. No definite superimposed pneumonia is seen. There is no evidence of pleural effusion or pneumothorax. A right PICC is noted ending about the mid to distal SVC. The heart is mildly enlarged. No acute osseous abnormalities are seen. Clips are noted at the left axilla. IMPRESSION: Bilateral pulmonary metastases again noted. No superimposed focal airspace consolidation seen. Mild cardiomegaly. Electronically Signed   By: Garald Balding M.D.   On: 02/23/2017 00:14   Dg Chest 2 View  Result Date: 02/21/2017 CLINICAL DATA:  Shortness of breath and dry cough for 4 days. EXAM: CHEST  2 VIEW COMPARISON:  February 12, 2017, CT chest Jan 30, 2017 FINDINGS: The heart size and mediastinal contours are stable. The heart size is enlarged. Bilateral lung masses are identified consistent with patient's known metastatic disease as seen on prior chest CT. There is minimal left pleural effusion. There is no pulmonary edema. The visualized skeletal structures are stable. IMPRESSION: Bilateral lung masses consistent with patient's known metastatic disease. Minimal left pleural effusion. Electronically Signed   By: Abelardo Diesel M.D.   On: 02/21/2017 16:28    Procedures Procedures (including critical care time)  Medications Ordered in ED Medications  fentaNYL (SUBLIMAZE) injection 50 mcg (50 mcg Intravenous Given 02/22/17 2342)     Initial Impression / Assessment and Plan / ED Course  I have reviewed the triage vital signs and the nursing notes.  Pertinent labs & imaging results that were available during my care of the patient were reviewed by me and considered in my medical decision making (see chart for details).     Per note of Dr. Haroldine Laws 02/21/17, patient seen in CHF clinic in follow up of  hospitalization where she was diagnosed new onset A-fib as well as CHF. Given Lasix in office and started on 40 mg Lasix with potassium supplement at home. She was told to return to ER if symptoms worsen, which prompted her return tonight.  6:40: Dr. Otelia Sergeant (on-call for CHF clinic) paged 7:05: Dr. Otelia Sergeant paged a second time.  Discussed the patient with Dr. Johnsie Cancel who will be in to evaluate the patient for admission.   Final Clinical Impressions(s) / ED Diagnoses   Final diagnoses:  None   1. Shortness of breath  New Prescriptions New Prescriptions   No medications on file     Charlann Lange, Hershal Coria 03/03/17 2315    Ward, Delice Bison, DO 03/04/17 0222

## 2017-02-23 NOTE — ED Notes (Signed)
Report called  

## 2017-02-23 NOTE — ED Notes (Signed)
Pt is back in NSR , EKG  Given to MD

## 2017-02-24 ENCOUNTER — Observation Stay (HOSPITAL_BASED_OUTPATIENT_CLINIC_OR_DEPARTMENT_OTHER): Payer: Federal, State, Local not specified - PPO

## 2017-02-24 DIAGNOSIS — I4892 Unspecified atrial flutter: Secondary | ICD-10-CM | POA: Diagnosis present

## 2017-02-24 DIAGNOSIS — I5042 Chronic combined systolic (congestive) and diastolic (congestive) heart failure: Secondary | ICD-10-CM | POA: Diagnosis present

## 2017-02-24 DIAGNOSIS — C50412 Malignant neoplasm of upper-outer quadrant of left female breast: Secondary | ICD-10-CM | POA: Diagnosis not present

## 2017-02-24 DIAGNOSIS — C7951 Secondary malignant neoplasm of bone: Secondary | ICD-10-CM | POA: Diagnosis not present

## 2017-02-24 DIAGNOSIS — E876 Hypokalemia: Secondary | ICD-10-CM | POA: Diagnosis present

## 2017-02-24 DIAGNOSIS — G620 Drug-induced polyneuropathy: Secondary | ICD-10-CM | POA: Diagnosis present

## 2017-02-24 DIAGNOSIS — I480 Paroxysmal atrial fibrillation: Secondary | ICD-10-CM | POA: Diagnosis present

## 2017-02-24 DIAGNOSIS — I2699 Other pulmonary embolism without acute cor pulmonale: Secondary | ICD-10-CM

## 2017-02-24 DIAGNOSIS — D638 Anemia in other chronic diseases classified elsewhere: Secondary | ICD-10-CM | POA: Diagnosis present

## 2017-02-24 DIAGNOSIS — I429 Cardiomyopathy, unspecified: Secondary | ICD-10-CM

## 2017-02-24 DIAGNOSIS — Z91041 Radiographic dye allergy status: Secondary | ICD-10-CM | POA: Diagnosis not present

## 2017-02-24 DIAGNOSIS — C778 Secondary and unspecified malignant neoplasm of lymph nodes of multiple regions: Secondary | ICD-10-CM | POA: Diagnosis not present

## 2017-02-24 DIAGNOSIS — Z885 Allergy status to narcotic agent status: Secondary | ICD-10-CM | POA: Diagnosis not present

## 2017-02-24 DIAGNOSIS — Z7901 Long term (current) use of anticoagulants: Secondary | ICD-10-CM | POA: Diagnosis not present

## 2017-02-24 DIAGNOSIS — Z171 Estrogen receptor negative status [ER-]: Secondary | ICD-10-CM | POA: Diagnosis not present

## 2017-02-24 DIAGNOSIS — K219 Gastro-esophageal reflux disease without esophagitis: Secondary | ICD-10-CM | POA: Diagnosis present

## 2017-02-24 DIAGNOSIS — Z7189 Other specified counseling: Secondary | ICD-10-CM | POA: Diagnosis not present

## 2017-02-24 DIAGNOSIS — C7801 Secondary malignant neoplasm of right lung: Secondary | ICD-10-CM | POA: Diagnosis present

## 2017-02-24 DIAGNOSIS — G629 Polyneuropathy, unspecified: Secondary | ICD-10-CM | POA: Diagnosis present

## 2017-02-24 DIAGNOSIS — T451X5A Adverse effect of antineoplastic and immunosuppressive drugs, initial encounter: Secondary | ICD-10-CM

## 2017-02-24 DIAGNOSIS — Z853 Personal history of malignant neoplasm of breast: Secondary | ICD-10-CM | POA: Diagnosis not present

## 2017-02-24 DIAGNOSIS — C7800 Secondary malignant neoplasm of unspecified lung: Secondary | ICD-10-CM | POA: Diagnosis not present

## 2017-02-24 DIAGNOSIS — R0609 Other forms of dyspnea: Secondary | ICD-10-CM

## 2017-02-24 DIAGNOSIS — C7802 Secondary malignant neoplasm of left lung: Secondary | ICD-10-CM | POA: Diagnosis present

## 2017-02-24 DIAGNOSIS — I509 Heart failure, unspecified: Secondary | ICD-10-CM | POA: Diagnosis not present

## 2017-02-24 DIAGNOSIS — I208 Other forms of angina pectoris: Secondary | ICD-10-CM

## 2017-02-24 DIAGNOSIS — Z888 Allergy status to other drugs, medicaments and biological substances status: Secondary | ICD-10-CM | POA: Diagnosis not present

## 2017-02-24 DIAGNOSIS — R0602 Shortness of breath: Secondary | ICD-10-CM | POA: Diagnosis not present

## 2017-02-24 LAB — BASIC METABOLIC PANEL
Anion gap: 6 (ref 5–15)
BUN: 14 mg/dL (ref 6–20)
CALCIUM: 9.7 mg/dL (ref 8.9–10.3)
CO2: 27 mmol/L (ref 22–32)
CREATININE: 0.77 mg/dL (ref 0.44–1.00)
Chloride: 100 mmol/L — ABNORMAL LOW (ref 101–111)
GFR calc non Af Amer: >60 mL/min (ref >60)
Glucose, Bld: 95 mg/dL (ref 65–99)
Potassium: 3.1 mmol/L — ABNORMAL LOW (ref 3.5–5.1)
SODIUM: 133 mmol/L — AB (ref 135–145)

## 2017-02-24 LAB — CBC
HCT: 29.5 % — ABNORMAL LOW (ref 36.0–46.0)
Hemoglobin: 9.2 g/dL — ABNORMAL LOW (ref 12.0–15.0)
MCH: 27.3 pg (ref 26.0–34.0)
MCHC: 31.2 g/dL (ref 30.0–36.0)
MCV: 87.5 fL (ref 78.0–100.0)
PLATELETS: 361 10*3/uL (ref 150–400)
RBC: 3.37 MIL/uL — ABNORMAL LOW (ref 3.87–5.11)
RDW: 19.4 % — AB (ref 11.5–15.5)
WBC: 10 10*3/uL (ref 4.0–10.5)

## 2017-02-24 MED ORDER — POTASSIUM CHLORIDE CRYS ER 20 MEQ PO TBCR
40.0000 meq | EXTENDED_RELEASE_TABLET | Freq: Two times a day (BID) | ORAL | Status: AC
  Administered 2017-02-24 (×2): 40 meq via ORAL
  Filled 2017-02-24 (×2): qty 2

## 2017-02-24 MED ORDER — SODIUM CHLORIDE 0.9% FLUSH: 10.0000 mL | INTRAVENOUS | Status: DC | PRN

## 2017-02-24 MED ORDER — POLYETHYLENE GLYCOL 3350 17 G PO PACK
17.0000 g | PACK | Freq: Every day | ORAL | Status: DC
  Administered 2017-02-24 – 2017-02-26 (×3): 17 g via ORAL
  Filled 2017-02-24 (×3): qty 1

## 2017-02-24 MED ORDER — METOPROLOL TARTRATE 5 MG/5ML IV SOLN
5.0000 mg | Freq: Four times a day (QID) | INTRAVENOUS | Status: DC | PRN
  Administered 2017-02-24: 5 mg via INTRAVENOUS
  Filled 2017-02-24: qty 5

## 2017-02-24 MED ORDER — FENTANYL 12 MCG/HR TD PT72
12.5000 ug | MEDICATED_PATCH | TRANSDERMAL | Status: DC
  Administered 2017-02-24: 12.5 ug via TRANSDERMAL
  Filled 2017-02-24: qty 1

## 2017-02-24 MED ORDER — TRAMADOL HCL 50 MG PO TABS
100.0000 mg | ORAL_TABLET | Freq: Four times a day (QID) | ORAL | Status: DC | PRN
  Filled 2017-02-24: qty 2

## 2017-02-24 MED ORDER — FUROSEMIDE 40 MG PO TABS
40.0000 mg | ORAL_TABLET | Freq: Two times a day (BID) | ORAL | Status: DC
  Administered 2017-02-24 – 2017-02-26 (×4): 40 mg via ORAL
  Filled 2017-02-24 (×4): qty 1

## 2017-02-24 MED ORDER — SENNOSIDES-DOCUSATE SODIUM 8.6-50 MG PO TABS
2.0000 | ORAL_TABLET | Freq: Two times a day (BID) | ORAL | Status: DC
  Administered 2017-02-24 – 2017-02-26 (×5): 2 via ORAL
  Filled 2017-02-24 (×5): qty 2

## 2017-02-24 NOTE — Progress Notes (Signed)
Progress Note  Patient Name: Cindy Bradshaw Date of Encounter: 02/24/2017  Primary Cardiologist: Dr. Haroldine Laws  Subjective   Feeling well.  Continues to have chest pain.  Inpatient Medications    Scheduled Meds: . amiodarone  400 mg Oral BID  . carvedilol  6.25 mg Oral BID WC  . cholecalciferol  1,000 Units Oral Daily  . dexamethasone  4 mg Oral Daily  . enoxaparin (LOVENOX) injection  95 mg Subcutaneous Q12H  . ferrous sulfate  325 mg Oral TID WC  . furosemide  40 mg Intravenous BID  . Melatonin  9 mg Oral QHS  . potassium chloride SA  20 mEq Oral Daily  . tobramycin-dexamethasone  2 drop Both Eyes Q4H while awake   Continuous Infusions: . sodium chloride 20 mL/hr at 02/23/17 0601   PRN Meds: acetaminophen, albuterol, fentaNYL (SUBLIMAZE) injection, gi cocktail, guaiFENesin-dextromethorphan, morphine, morphine injection, nitroGLYCERIN, ondansetron (ZOFRAN) IV, ondansetron, senna, sodium chloride flush, traMADol   Vital Signs    Vitals:   02/23/17 1638 02/23/17 2100 02/24/17 0056 02/24/17 0445  BP: 127/71 121/60 120/63 111/62  Pulse: 87 91 91 93  Resp: 18 18 18 18   Temp: 99.2 F (37.3 C) 98.7 F (37.1 C) 98.5 F (36.9 C) 98.8 F (37.1 C)  TempSrc: Oral Oral Oral Oral  SpO2: 97% 97% 94% 98%  Weight:      Height:        Intake/Output Summary (Last 24 hours) at 02/24/17 0728 Last data filed at 02/24/17 0649  Gross per 24 hour  Intake              540 ml  Output              800 ml  Net             -260 ml   Filed Weights   02/23/17 1014  Weight: 92.9 kg (204 lb 12.9 oz)    Telemetry    No events  - Personally Reviewed  ECG    02/24/17: Sinus rhythm.  Rate 94 bpm. - Personally Reviewed  Physical Exam   GEN: Tired appearing.  No acute distress.   Neck: No JVD Cardiac: RRR, no murmurs, rubs, or gallops.  Respiratory: Clear to auscultation bilaterally. GI: Soft, nontender, non-distended  MS: No edema; No deformity.  Palpable cord in L  calf Neuro:  Nonfocal  Psych: Normal affect   Labs    Chemistry Recent Labs Lab 02/18/17 0840 02/22/17 2336  NA 135* 132*  K 4.1 3.4*  CL  --  100*  CO2 25 23  GLUCOSE 99 119*  BUN 30.7* 16  CREATININE 0.8 1.04*  CALCIUM 9.7 9.2  PROT 6.4  --   ALBUMIN 2.5*  --   AST 48*  --   ALT 92*  --   ALKPHOS 112  --   BILITOT 0.43  --   GFRNONAA  --  >60  GFRAA  --  >60  ANIONGAP 7 9     Hematology Recent Labs Lab 02/18/17 0839 02/22/17 2336 02/24/17 0658  WBC 5.4 9.3 10.0  RBC 3.93 3.62* 3.37*  HGB 11.3* 10.1* 9.2*  HCT 34.7* 31.5* 29.5*  MCV 88.2 87.0 87.5  MCH 28.7 27.9 27.3  MCHC 32.5 32.1 31.2  RDW 21.3* 19.5* 19.4*  PLT 367 427* 361    Cardiac Enzymes Recent Labs Lab 02/23/17 1202  TROPONINI <0.03    Recent Labs Lab 02/23/17 0006  TROPIPOC 0.02     BNP  Recent Labs Lab 02/22/17 2336  BNP 116.2*     DDimer No results for input(s): DDIMER in the last 168 hours.   Radiology    Dg Chest 2 View  Result Date: 02/23/2017 CLINICAL DATA:  Acute onset of cough, shortness of breath and generalized chest pain. Nausea and vomiting. Initial encounter. EXAM: CHEST  2 VIEW COMPARISON:  Chest radiograph performed 02/21/2017 FINDINGS: The lungs are well-aerated. The patient's bilateral pulmonary metastases are again noted. No definite superimposed pneumonia is seen. There is no evidence of pleural effusion or pneumothorax. A right PICC is noted ending about the mid to distal SVC. The heart is mildly enlarged. No acute osseous abnormalities are seen. Clips are noted at the left axilla. IMPRESSION: Bilateral pulmonary metastases again noted. No superimposed focal airspace consolidation seen. Mild cardiomegaly. Electronically Signed   By: Garald Balding M.D.   On: 02/23/2017 00:14   Ct Angio Chest Pe W And/or Wo Contrast  Result Date: 02/23/2017 CLINICAL DATA:  Short of breath, metastatic breast cancer lung cancer. EXAM: CT ANGIOGRAPHY CHEST WITH CONTRAST TECHNIQUE:  Multidetector CT imaging of the chest was performed using the standard protocol during bolus administration of intravenous contrast. Multiplanar CT image reconstructions and MIPs were obtained to evaluate the vascular anatomy. CONTRAST:  100 mL Isovue COMPARISON:  CT 01/30/2017 FINDINGS: Cardiovascular: There is a new filling defect within the RIGHT lobe pulmonary artery (image 39, series 8) consistent with acute thromboembolism. Thrombus is occlusive to the more distal RIGHT lower lobe pulmonary arteries. No additional pulmonary emboli. The RIGHT ventricle to LEFT ventricle ratio is less than 1 inconsistent with RIGHT heart strain There is extensive tumor burden surrounding the pulmonary arteries within LEFT RIGHT hila. Mediastinum/Nodes: Extensive adenopathy surrounding the LEFT or RIGHT main pulmonary arteries as well as the bronchus. Findings similar prior. Lungs/Pleura: The RIGHT lower lobe bronchus is compressed by a tumor (image 77, series 10). There is interval enlargement of a masslike infrahilar lesions on the RIGHT measuring 6.2 cm in maximum coronal dimension (series 6, series 10) compared with 5.6 cm on comparison CT. Significant infrahilar masslike lesion the LEFT additionally. Bilateral multiple discrete pulmonary nodules. Example nodule in the lingula measures 2.2 cm compared to 2.1 (image 36, series 9). LEFT upper lobe nodule measures 15 mm compared to 15 mm. Mass spanning the RIGHT middle lobe measures 4.1 cm in thickness compared to 3.3 (image 53, series 9 Upper Abdomen: Limited view of the liver, kidneys, pancreas are unremarkable. Normal adrenal glands. Musculoskeletal: No aggressive osseous lesion. Review of the MIP images confirms the above findings. IMPRESSION: 1. Acute pulmonary embolism within the RIGHT lower lobe pulmonary artery which is occlusive. Overall clot burden is mild to moderate. No evidence of RIGHT ventricular strain. 2. Interval increase and RIGHT infrahilar mass with  obstruction of the RIGHT lower bronchus. 3. Bulky hilar adenopathy on LEFT and RIGHT is similar as well as mediastinal adenopathy. 4. Individual bilateral nodules are similar in size. 5. Increase RIGHT infrahilar and RIGHT middle lobe mass lesions. 6. Overall evidence of progression of pulmonary metastatic disease. Critical Value/emergent results were called by telephone at the time of interpretation on 02/23/2017 at 4:03 pm to Dr. Dyanne Carrel , who verbally acknowledged these results. Electronically Signed   By: Suzy Bouchard M.D.   On: 02/23/2017 16:04    Cardiac Studies   Echo 02/12/17: Study Conclusions  - Left ventricle: The cavity size was severely dilated. Wall   thickness was normal. Systolic function was severely reduced. The  estimated ejection fraction was in the range of 25% to 30%.   Diffuse hypokinesis. The study is not technically sufficient to   allow evaluation of LV diastolic function. - Mitral valve: There was mild regurgitation. - Left atrium: The atrium was moderately dilated. - Right atrium: The atrium was mildly dilated.  LHC 01/24/16: 1. Angiographically normal coronary arteries 2. The left ventricular systolic function is normal.    Patient Profile     46 y.o. 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  Assessment & Plan   # Chest pain: Given that she had no evidence of coronary artery disease 01/2016, it is unlikely that her chest pain is due to ischemia. She does have known pulmonary metastases as well as pulmonary embolism diagnosed this admission.  No ischemia evaluation at this time.  Management of pulmonary mets and PE per primary team and oncology.    # Chronic systolic and diastolic heart failure: LVEF 25-30% this admission.  No CAD on cath. This is likely chemotherapy induced.   She was also recently diagnosed with atrial fibrillation with  rapid ventricular response.  She is currently in sinus rhythm.  Would consider switching carvedilol to metoprolol for increased ability to add an ARB/ARNI.   # Paroxysmal atrial fibrillation/flutter:  Ms. Anspaugh is back in sinus rhythm.  Continue amiodarone and carvedilol.     Signed, Skeet Latch, MD  02/24/2017, 7:28 AM

## 2017-02-24 NOTE — Progress Notes (Addendum)
VASCULAR LAB PRELIMINARY  PRELIMINARY  PRELIMINARY  PRELIMINARY  Bilateral lower extremity venous duplex completed.    Preliminary report:  There is subacute, non occlusive DVT noted in the  Left distal femoral/proximal popliteal vein.  All other veins appear thrombus free. Hypoechoic lesion noted in the proximal to mid medial calf found 5/18/1/8 remains, now with vascularization noted. Etiology unknown.   Called report to [place], RN  Arielys Wandersee, [place], RVT 02/24/2017, 11:33 AM

## 2017-02-24 NOTE — Progress Notes (Addendum)
PROGRESS NOTE    Cindy Bradshaw  RWE:[id number] DOB: [birth year] DOA: 02/23/2017 PCP: [name], MD    Brief Narrative: 46 year old lady with h/o breast cancer with mets to lungs, afib on xarelto, cardiomyopathy with LVEF of 25%, peripheral neuropathy, anemia of chronic disease comes in for chest pain. She underwent CT angio showing pulmonary embolism. Meanwhile, cardiology consulted for chest pain evaluation.     Assessment & Plan:   Principal Problem:   Chest pain Active Problems:   Breast cancer of upper-outer quadrant of left female breast (HCC)   Chemotherapy-induced peripheral neuropathy (HCC)   A-fib (HCC)   Dyspnea   Cardiomyopathy ([place])   Acute pulmonary embolism (HCC)   Chest pain: probably from acute PE.  Improved with morphine.    Acute pulmonary embolism: right lower lobe.  Was on xarelto, changed to Lovenox injections.  No right heart strain on CT.  Will have to remain on Lovenox injections from now on.     Cardiomyopathy:  LVEF of 25%.  Resume home meds.  Does not appear to be fluid overload, resume coreg.    PAF:  In sinus, resume amiodarone and coreg.    Chronic systolic heart failure:  Does not appear to be in fluid overload.  Resume lasix.  Watch creatinine on lasix.    Hypokalemia: repleted as needed.    Normocytic anemia:  Hemoglobin baseline appears to be around 9.  Anemia of chronic disease.   Breast cancer: further management as per her oncologist. Dr Lindi Adie.   DVT prophylaxis: Lovenox.  Code Status: (Full) Family Communication: none at bedside.  Disposition Plan: home when chest pain improves. She will likely be discharged home on lovenox injections.    Consultants:  Cardiology.    Procedures: none.    Antimicrobials: none.    Subjective: Requesting increase in pain medication Hasn't had bm in 5 days.  No chest pain .   Objective: Vitals:   02/23/17 1638 02/23/17 2100 02/24/17 0056 02/24/17 0445    BP: 127/71 121/60 120/63 111/62  Pulse: 87 91 91 93  Resp: 18 18 18 18   Temp: 99.2 F (37.3 C) 98.7 F (37.1 C) 98.5 F (36.9 C) 98.8 F (37.1 C)  TempSrc: Oral Oral Oral Oral  SpO2: 97% 97% 94% 98%  Weight:      Height:        Intake/Output Summary (Last 24 hours) at 02/24/17 0912 Last data filed at 02/24/17 0649  Gross per 24 hour  Intake              540 ml  Output              800 ml  Net             -260 ml   Filed Weights   02/23/17 1014  Weight: 92.9 kg (204 lb 12.9 oz)    Examination:  General exam: Appears in mild distress from chest pain.  Respiratory system: no rales, no wheezing, air entry fair.  Cardiovascular system: S1 & S2 heard, RRR. No pedal edema. Gastrointestinal system: Abdomen is nondistended, soft and nontender. No organomegaly or masses felt. Normal bowel sounds heard. Central nervous system: Alert and oriented. No focal neurological deficits. Extremities: Symmetric 5 x 5 power. Skin: No rashes, lesions or ulcers Psychiatry: Judgement and insight appear normal. Mood & affect appropriate.     Data Reviewed: I have personally reviewed following labs and imaging studies  CBC:  Recent Labs Lab 02/18/17 [phone]  02/22/17 2336 02/24/17 0658  WBC 5.4 9.3 10.0  NEUTROABS 2.6  --   --   HGB 11.3* 10.1* 9.2*  HCT 34.7* 31.5* 29.5*  MCV 88.2 87.0 87.5  PLT 367 427* 756   Basic Metabolic Panel:  Recent Labs Lab 02/18/17 0840 02/18/17 0955 02/22/17 2336 02/23/17 1202 02/24/17 0658  NA 135*  --  132*  --  133*  K 4.1  --  3.4*  --  3.1*  CL  --   --  100*  --  100*  CO2 25  --  23  --  27  GLUCOSE 99  --  119*  --  95  BUN 30.7*  --  16  --  14  CREATININE 0.8  --  1.04*  --  0.77  CALCIUM 9.7  --  9.2  --  9.7  MG  --  1.9  --  1.8  --   PHOS  --   --   --  2.5  --    GFR: Estimated Creatinine Clearance: 101.7 mL/min (by C-G formula based on SCr of 0.77 mg/dL). Liver Function Tests:  Recent Labs Lab 02/18/17 0840  AST 48*   ALT 92*  ALKPHOS 112  BILITOT 0.43  PROT 6.4  ALBUMIN 2.5*   No results for input(s): LIPASE, AMYLASE in the last 168 hours. No results for input(s): AMMONIA in the last 168 hours. Coagulation Profile: No results for input(s): INR, PROTIME in the last 168 hours. Cardiac Enzymes:  Recent Labs Lab 02/23/17 1202  TROPONINI <0.03   BNP (last 3 results) No results for input(s): PROBNP in the last 8760 hours. HbA1C: No results for input(s): HGBA1C in the last 72 hours. CBG: No results for input(s): GLUCAP in the last 168 hours. Lipid Profile: No results for input(s): CHOL, HDL, LDLCALC, TRIG, CHOLHDL, LDLDIRECT in the last 72 hours. Thyroid Function Tests: No results for input(s): TSH, T4TOTAL, FREET4, T3FREE, THYROIDAB in the last 72 hours. Anemia Panel: No results for input(s): VITAMINB12, FOLATE, FERRITIN, TIBC, IRON, RETICCTPCT in the last 72 hours. Sepsis Labs: No results for input(s): PROCALCITON, LATICACIDVEN in the last 168 hours.  Recent Results (from the past 240 hour(s))  TECHNOLOGIST REVIEW     Status: None   Collection Time: 02/18/17  8:39 AM  Result Value Ref Range Status   Technologist Review 2% metamyelocytes, 1% myelocyte.  Final         Radiology Studies: Dg Chest 2 View  Result Date: 02/23/2017 CLINICAL DATA:  Acute onset of cough, shortness of breath and generalized chest pain. Nausea and vomiting. Initial encounter. EXAM: CHEST  2 VIEW COMPARISON:  Chest radiograph performed 02/21/2017 FINDINGS: The lungs are well-aerated. The patient's bilateral pulmonary metastases are again noted. No definite superimposed pneumonia is seen. There is no evidence of pleural effusion or pneumothorax. A right PICC is noted ending about the mid to distal SVC. The heart is mildly enlarged. No acute osseous abnormalities are seen. Clips are noted at the left axilla. IMPRESSION: Bilateral pulmonary metastases again noted. No superimposed focal airspace consolidation seen.  Mild cardiomegaly. Electronically Signed   By: Garald Balding M.D.   On: 02/23/2017 00:14   Ct Angio Chest Pe W And/or Wo Contrast  Result Date: 02/23/2017 CLINICAL DATA:  Short of breath, metastatic breast cancer lung cancer. EXAM: CT ANGIOGRAPHY CHEST WITH CONTRAST TECHNIQUE: Multidetector CT imaging of the chest was performed using the standard protocol during bolus administration of intravenous contrast. Multiplanar CT image reconstructions and  MIPs were obtained to evaluate the vascular anatomy. CONTRAST:  100 mL Isovue COMPARISON:  CT 01/30/2017 FINDINGS: Cardiovascular: There is a new filling defect within the RIGHT lobe pulmonary artery (image 39, series 8) consistent with acute thromboembolism. Thrombus is occlusive to the more distal RIGHT lower lobe pulmonary arteries. No additional pulmonary emboli. The RIGHT ventricle to LEFT ventricle ratio is less than 1 inconsistent with RIGHT heart strain There is extensive tumor burden surrounding the pulmonary arteries within LEFT RIGHT hila. Mediastinum/Nodes: Extensive adenopathy surrounding the LEFT or RIGHT main pulmonary arteries as well as the bronchus. Findings similar prior. Lungs/Pleura: The RIGHT lower lobe bronchus is compressed by a tumor (image 77, series 10). There is interval enlargement of a masslike infrahilar lesions on the RIGHT measuring 6.2 cm in maximum coronal dimension (series 6, series 10) compared with 5.6 cm on comparison CT. Significant infrahilar masslike lesion the LEFT additionally. Bilateral multiple discrete pulmonary nodules. Example nodule in the lingula measures 2.2 cm compared to 2.1 (image 36, series 9). LEFT upper lobe nodule measures 15 mm compared to 15 mm. Mass spanning the RIGHT middle lobe measures 4.1 cm in thickness compared to 3.3 (image 53, series 9 Upper Abdomen: Limited view of the liver, kidneys, pancreas are unremarkable. Normal adrenal glands. Musculoskeletal: No aggressive osseous lesion. Review of the MIP  images confirms the above findings. IMPRESSION: 1. Acute pulmonary embolism within the RIGHT lower lobe pulmonary artery which is occlusive. Overall clot burden is mild to moderate. No evidence of RIGHT ventricular strain. 2. Interval increase and RIGHT infrahilar mass with obstruction of the RIGHT lower bronchus. 3. Bulky hilar adenopathy on LEFT and RIGHT is similar as well as mediastinal adenopathy. 4. Individual bilateral nodules are similar in size. 5. Increase RIGHT infrahilar and RIGHT middle lobe mass lesions. 6. Overall evidence of progression of pulmonary metastatic disease. Critical Value/emergent results were called by telephone at the time of interpretation on 02/23/2017 at 4:03 pm to Dr. Dyanne Carrel , who verbally acknowledged these results. Electronically Signed   By: Suzy Bouchard M.D.   On: 02/23/2017 16:04        Scheduled Meds: . amiodarone  400 mg Oral BID  . carvedilol  6.25 mg Oral BID WC  . cholecalciferol  1,000 Units Oral Daily  . dexamethasone  4 mg Oral Daily  . enoxaparin (LOVENOX) injection  95 mg Subcutaneous Q12H  . ferrous sulfate  325 mg Oral TID WC  . furosemide  40 mg Intravenous BID  . Melatonin  9 mg Oral QHS  . polyethylene glycol  17 g Oral Daily  . potassium chloride  40 mEq Oral BID  . senna-docusate  2 tablet Oral BID  . tobramycin-dexamethasone  2 drop Both Eyes Q4H while awake   Continuous Infusions: . sodium chloride 20 mL/hr at 02/23/17 0601     LOS: 0 days    Time spent: 45 minutes.     Hosie Poisson, MD [facility] Pager [phone]   If 7PM-7AM, please contact night-coverage [email or web address] Password [email or web address] 02/24/2017, 9:12 AM

## 2017-02-25 ENCOUNTER — Ambulatory Visit: Payer: Federal, State, Local not specified - PPO

## 2017-02-25 ENCOUNTER — Telehealth: Payer: Self-pay

## 2017-02-25 ENCOUNTER — Encounter (HOSPITAL_COMMUNITY): Payer: Self-pay | Admitting: General Practice

## 2017-02-25 ENCOUNTER — Other Ambulatory Visit: Payer: Federal, State, Local not specified - PPO

## 2017-02-25 DIAGNOSIS — Z171 Estrogen receptor negative status [ER-]: Secondary | ICD-10-CM

## 2017-02-25 DIAGNOSIS — Z7189 Other specified counseling: Secondary | ICD-10-CM

## 2017-02-25 DIAGNOSIS — I2699 Other pulmonary embolism without acute cor pulmonale: Principal | ICD-10-CM

## 2017-02-25 DIAGNOSIS — G620 Drug-induced polyneuropathy: Secondary | ICD-10-CM

## 2017-02-25 DIAGNOSIS — C778 Secondary and unspecified malignant neoplasm of lymph nodes of multiple regions: Secondary | ICD-10-CM

## 2017-02-25 DIAGNOSIS — C50412 Malignant neoplasm of upper-outer quadrant of left female breast: Secondary | ICD-10-CM

## 2017-02-25 DIAGNOSIS — I509 Heart failure, unspecified: Secondary | ICD-10-CM

## 2017-02-25 DIAGNOSIS — C7800 Secondary malignant neoplasm of unspecified lung: Secondary | ICD-10-CM

## 2017-02-25 DIAGNOSIS — C7951 Secondary malignant neoplasm of bone: Secondary | ICD-10-CM

## 2017-02-25 DIAGNOSIS — D638 Anemia in other chronic diseases classified elsewhere: Secondary | ICD-10-CM

## 2017-02-25 LAB — CBC
HEMATOCRIT: 29.9 % — AB (ref 36.0–46.0)
Hemoglobin: 9.3 g/dL — ABNORMAL LOW (ref 12.0–15.0)
MCH: 27.4 pg (ref 26.0–34.0)
MCHC: 31.1 g/dL (ref 30.0–36.0)
MCV: 88.2 fL (ref 78.0–100.0)
Platelets: 415 10*3/uL — ABNORMAL HIGH (ref 150–400)
RBC: 3.39 MIL/uL — ABNORMAL LOW (ref 3.87–5.11)
RDW: 19.3 % — AB (ref 11.5–15.5)
WBC: 11.3 10*3/uL — ABNORMAL HIGH (ref 4.0–10.5)

## 2017-02-25 LAB — BASIC METABOLIC PANEL
ANION GAP: 7 (ref 5–15)
BUN: 13 mg/dL (ref 6–20)
CALCIUM: 9.8 mg/dL (ref 8.9–10.3)
CO2: 26 mmol/L (ref 22–32)
Chloride: 101 mmol/L (ref 101–111)
Creatinine, Ser: 0.68 mg/dL (ref 0.44–1.00)
GFR calc Af Amer: >60 mL/min (ref >60)
GLUCOSE: 117 mg/dL — AB (ref 65–99)
POTASSIUM: 4.4 mmol/L (ref 3.5–5.1)
Sodium: 134 mmol/L — ABNORMAL LOW (ref 135–145)

## 2017-02-25 MED ORDER — ENOXAPARIN SODIUM 100 MG/ML ~~LOC~~ SOLN
95.0000 mg | Freq: Two times a day (BID) | SUBCUTANEOUS | Status: DC
  Administered 2017-02-25: 95 mg via SUBCUTANEOUS

## 2017-02-25 MED ORDER — METOPROLOL SUCCINATE ER 50 MG PO TB24
50.0000 mg | ORAL_TABLET | Freq: Every day | ORAL | Status: DC
  Administered 2017-02-26: 50 mg via ORAL
  Filled 2017-02-25: qty 1

## 2017-02-25 MED ORDER — LOSARTAN POTASSIUM 25 MG PO TABS
25.0000 mg | ORAL_TABLET | Freq: Every day | ORAL | Status: DC
  Administered 2017-02-26: 25 mg via ORAL
  Filled 2017-02-25: qty 1

## 2017-02-25 NOTE — Telephone Encounter (Signed)
Pt called that she is currently at [facility] with blood clots. She had lab and infusion today. appts cancelled and Dr Lindi Adie made aware.

## 2017-02-25 NOTE — Progress Notes (Signed)
PROGRESS NOTE    Cindy Bradshaw  ZOX:[id number] DOB: 11-12-1970 DOA: 02/23/2017 PCP: [name], MD    Brief Narrative: 46 year old lady with h/o breast cancer with mets to lungs, afib on xarelto, cardiomyopathy with LVEF of 25%, peripheral neuropathy, anemia of chronic disease comes in for chest pain. She underwent CT angio showing pulmonary embolism. Meanwhile, cardiology consulted for chest pain evaluation. Left lower extremity duplex showed a sub acute DVT.     Assessment & Plan:   Principal Problem:   Chest pain Active Problems:   Breast cancer of upper-outer quadrant of left female breast (HCC)   Chemotherapy-induced peripheral neuropathy (HCC)   A-fib (HCC)   Dyspnea   Cardiomyopathy ([place])   Acute pulmonary embolism (HCC)   Chest pain: probably from acute PE.  Improved with morphine, but not completely resolved. Reports sob on moving, talking or getting out of bed.     Acute pulmonary embolism: right lower lobe.  Was on xarelto, changed to Lovenox injections.  No right heart strain on CT.  Will have to remain on Lovenox injections from now on.     Cardiomyopathy:  LVEF of 25%.  Resume home meds.  Does not appear to be fluid overload, resume losartan, metoprolol from am.    PAF:  In sinus, resume amiodarone , coreg changed to morphine in view of her low LVEF by cardiology.    Chronic systolic heart failure:  Does not appear to be in fluid overload.  Resume lasix.  Watch creatinine on lasix.    Hypokalemia: repleted as needed.    Normocytic anemia:  Hemoglobin baseline appears to be around 9.  Anemia of chronic disease.   Breast cancer: further management as per her oncologist. Dr Lindi Adie.   DVT prophylaxis: Lovenox.  Code Status: (Full) Family Communication: none at bedside.  Disposition Plan: home when chest pain improves, possibly tomorrow. She will likely be discharged home on lovenox injections.    Consultants:  Cardiology.     Procedures: none.    Antimicrobials: none.    Subjective: Reports persistent pain, added fentanyl patch.  No nausea or vomiting.  Sob on talking, getting out of bed and with minimal activity.  Objective: Vitals:   02/24/17 1254 02/24/17 1956 02/25/17 0423 02/25/17 1348  BP: (!) 191/84 117/64 128/66 137/85  Pulse: (!) 105 (!) 56 87 80  Resp:  18 18 18   Temp: 99.8 F (37.7 C) 98.7 F (37.1 C) 98.4 F (36.9 C) 98.4 F (36.9 C)  TempSrc: Oral Oral Oral Oral  SpO2: 98% 95% 94% 96%  Weight:      Height:        Intake/Output Summary (Last 24 hours) at 02/25/17 1419 Last data filed at 02/25/17 1300  Gross per 24 hour  Intake             1565 ml  Output              401 ml  Net             1164 ml   Filed Weights   02/23/17 1014  Weight: 92.9 kg (204 lb 12.9 oz)    Examination:  General exam: better than yesterday, more calm and comfortable.  Respiratory system: no rales, no wheezing, air entry fair.  Cardiovascular system: S1 & S2 heard,RRR. No pedal edema. Gastrointestinal system: Abdomen is nondistended, soft and nontender. No organomegaly or masses felt. Normal bowel sounds heard. Central nervous system: Alert and oriented. No focal neurological  deficits. Extremities: LEFT LEG PAIN  Skin: No rashes, lesions or ulcers Psychiatry: Judgement and insight appear normal. Mood & affect appropriate.     Data Reviewed: I have personally reviewed following labs and imaging studies  CBC:  Recent Labs Lab 02/22/17 2336 02/24/17 0658 02/25/17 0451  WBC 9.3 10.0 11.3*  HGB 10.1* 9.2* 9.3*  HCT 31.5* 29.5* 29.9*  MCV 87.0 87.5 88.2  PLT 427* 361 166*   Basic Metabolic Panel:  Recent Labs Lab 02/22/17 2336 02/23/17 1202 02/24/17 0658 02/25/17 0451  NA 132*  --  133* 134*  K 3.4*  --  3.1* 4.4  CL 100*  --  100* 101  CO2 23  --  27 26  GLUCOSE 119*  --  95 117*  BUN 16  --  14 13  CREATININE 1.04*  --  0.77 0.68  CALCIUM 9.2  --  9.7 9.8  MG  --  1.8   --   --   PHOS  --  2.5  --   --    GFR: Estimated Creatinine Clearance: 101.7 mL/min (by C-G formula based on SCr of 0.68 mg/dL). Liver Function Tests: No results for input(s): AST, ALT, ALKPHOS, BILITOT, PROT, ALBUMIN in the last 168 hours. No results for input(s): LIPASE, AMYLASE in the last 168 hours. No results for input(s): AMMONIA in the last 168 hours. Coagulation Profile: No results for input(s): INR, PROTIME in the last 168 hours. Cardiac Enzymes:  Recent Labs Lab 02/23/17 1202  TROPONINI <0.03   BNP (last 3 results) No results for input(s): PROBNP in the last 8760 hours. HbA1C: No results for input(s): HGBA1C in the last 72 hours. CBG: No results for input(s): GLUCAP in the last 168 hours. Lipid Profile: No results for input(s): CHOL, HDL, LDLCALC, TRIG, CHOLHDL, LDLDIRECT in the last 72 hours. Thyroid Function Tests: No results for input(s): TSH, T4TOTAL, FREET4, T3FREE, THYROIDAB in the last 72 hours. Anemia Panel: No results for input(s): VITAMINB12, FOLATE, FERRITIN, TIBC, IRON, RETICCTPCT in the last 72 hours. Sepsis Labs: No results for input(s): PROCALCITON, LATICACIDVEN in the last 168 hours.  Recent Results (from the past 240 hour(s))  TECHNOLOGIST REVIEW     Status: None   Collection Time: 02/18/17  8:39 AM  Result Value Ref Range Status   Technologist Review 2% metamyelocytes, 1% myelocyte.  Final         Radiology Studies: Ct Angio Chest Pe W And/or Wo Contrast  Result Date: 02/23/2017 CLINICAL DATA:  Short of breath, metastatic breast cancer lung cancer. EXAM: CT ANGIOGRAPHY CHEST WITH CONTRAST TECHNIQUE: Multidetector CT imaging of the chest was performed using the standard protocol during bolus administration of intravenous contrast. Multiplanar CT image reconstructions and MIPs were obtained to evaluate the vascular anatomy. CONTRAST:  100 mL Isovue COMPARISON:  CT 01/30/2017 FINDINGS: Cardiovascular: There is a new filling defect within the  RIGHT lobe pulmonary artery (image 39, series 8) consistent with acute thromboembolism. Thrombus is occlusive to the more distal RIGHT lower lobe pulmonary arteries. No additional pulmonary emboli. The RIGHT ventricle to LEFT ventricle ratio is less than 1 inconsistent with RIGHT heart strain There is extensive tumor burden surrounding the pulmonary arteries within LEFT RIGHT hila. Mediastinum/Nodes: Extensive adenopathy surrounding the LEFT or RIGHT main pulmonary arteries as well as the bronchus. Findings similar prior. Lungs/Pleura: The RIGHT lower lobe bronchus is compressed by a tumor (image 77, series 10). There is interval enlargement of a masslike infrahilar lesions on the RIGHT measuring 6.2 cm  in maximum coronal dimension (series 6, series 10) compared with 5.6 cm on comparison CT. Significant infrahilar masslike lesion the LEFT additionally. Bilateral multiple discrete pulmonary nodules. Example nodule in the lingula measures 2.2 cm compared to 2.1 (image 36, series 9). LEFT upper lobe nodule measures 15 mm compared to 15 mm. Mass spanning the RIGHT middle lobe measures 4.1 cm in thickness compared to 3.3 (image 53, series 9 Upper Abdomen: Limited view of the liver, kidneys, pancreas are unremarkable. Normal adrenal glands. Musculoskeletal: No aggressive osseous lesion. Review of the MIP images confirms the above findings. IMPRESSION: 1. Acute pulmonary embolism within the RIGHT lower lobe pulmonary artery which is occlusive. Overall clot burden is mild to moderate. No evidence of RIGHT ventricular strain. 2. Interval increase and RIGHT infrahilar mass with obstruction of the RIGHT lower bronchus. 3. Bulky hilar adenopathy on LEFT and RIGHT is similar as well as mediastinal adenopathy. 4. Individual bilateral nodules are similar in size. 5. Increase RIGHT infrahilar and RIGHT middle lobe mass lesions. 6. Overall evidence of progression of pulmonary metastatic disease. Critical Value/emergent results were  called by telephone at the time of interpretation on 02/23/2017 at 4:03 pm to Dr. Dyanne Carrel , who verbally acknowledged these results. Electronically Signed   By: Suzy Bouchard M.D.   On: 02/23/2017 16:04        Scheduled Meds: . amiodarone  400 mg Oral BID  . carvedilol  6.25 mg Oral BID WC  . cholecalciferol  1,000 Units Oral Daily  . dexamethasone  4 mg Oral Daily  . enoxaparin (LOVENOX) injection  95 mg Subcutaneous Q12H  . fentaNYL  12.5 mcg Transdermal Q72H  . ferrous sulfate  325 mg Oral TID WC  . furosemide  40 mg Oral BID  . [START ON 02/26/2017] losartan  25 mg Oral Daily  . Melatonin  9 mg Oral QHS  . [START ON 02/26/2017] metoprolol succinate  50 mg Oral Daily  . polyethylene glycol  17 g Oral Daily  . senna-docusate  2 tablet Oral BID  . tobramycin-dexamethasone  2 drop Both Eyes Q4H while awake   Continuous Infusions: . sodium chloride 20 mL/hr at 02/24/17 1738     LOS: 1 day    Time spent: 35 minutes.     Hosie Poisson, MD [facility] Pager [phone]   If 7PM-7AM, please contact night-coverage [email or web address] Password [email or web address] 02/25/2017, 2:19 PM

## 2017-02-25 NOTE — Progress Notes (Signed)
Progress Note  Patient Name: Cindy K. [name] Date of Encounter: 02/25/2017  Primary Cardiologist: Dr. Haroldine Laws  Subjective   Chest pain with cough. No dyspnea. LE doppler showed subacute DVT in Left distal femoral/proximal popliteal vein on preliminary report.   Inpatient Medications    Scheduled Meds: . amiodarone  400 mg Oral BID  . carvedilol  6.25 mg Oral BID WC  . cholecalciferol  1,000 Units Oral Daily  . dexamethasone  4 mg Oral Daily  . enoxaparin (LOVENOX) injection  95 mg Subcutaneous Q12H  . fentaNYL  12.5 mcg Transdermal Q72H  . ferrous sulfate  325 mg Oral TID WC  . furosemide  40 mg Oral BID  . Melatonin  9 mg Oral QHS  . polyethylene glycol  17 g Oral Daily  . senna-docusate  2 tablet Oral BID  . tobramycin-dexamethasone  2 drop Both Eyes Q4H while awake   Continuous Infusions: . sodium chloride 20 mL/hr at 02/24/17 1738   PRN Meds: acetaminophen, albuterol, fentaNYL (SUBLIMAZE) injection, gi cocktail, guaiFENesin-dextromethorphan, metoprolol tartrate, morphine, morphine injection, nitroGLYCERIN, ondansetron (ZOFRAN) IV, ondansetron, sodium chloride flush, traMADol   Vital Signs    Vitals:   02/24/17 1251 02/24/17 1254 02/24/17 1956 02/25/17 0423  BP: 107/67 (!) 191/84 117/64 128/66  Pulse: (!) 59 (!) 105 (!) 56 87  Resp:   18 18  Temp: 98.7 F (37.1 C) 99.8 F (37.7 C) 98.7 F (37.1 C) 98.4 F (36.9 C)  TempSrc: Oral Oral Oral Oral  SpO2: 97% 98% 95% 94%  Weight:      Height:        Intake/Output Summary (Last 24 hours) at 02/25/17 0849 Last data filed at 02/25/17 0700  Gross per 24 hour  Intake             1085 ml  Output              401 ml  Net              684 ml   Filed Weights   02/23/17 1014  Weight: 92.9 kg (204 lb 12.9 oz)    Telemetry    Currently in NSR, intermittent afib (yesterday from 11am to 11 pm and briefly 5am this morning)  - Personally Reviewed  ECG    None today   Physical Exam   GEN: No acute distress.   Looks depressed  Neck: No JVD Cardiac: RRR, no murmurs, rubs, or gallops.  Respiratory: Clear to auscultation bilaterally. GI: Soft, nontender, non-distended  MS: No edema; No deformity. Neuro:  Nonfocal  Psych: Normal affect   Labs    Chemistry Recent Labs Lab 02/22/17 2336 02/24/17 0658 02/25/17 0451  NA 132* 133* 134*  K 3.4* 3.1* 4.4  CL 100* 100* 101  CO2 23 27 26   GLUCOSE 119* 95 117*  BUN 16 14 13   CREATININE 1.04* 0.77 0.68  CALCIUM 9.2 9.7 9.8  GFRNONAA >60 >60 >60  GFRAA >60 >60 >60  ANIONGAP 9 6 7      Hematology Recent Labs Lab 02/22/17 2336 02/24/17 0658 02/25/17 0451  WBC 9.3 10.0 11.3*  RBC 3.62* 3.37* 3.39*  HGB 10.1* 9.2* 9.3*  HCT 31.5* 29.5* 29.9*  MCV 87.0 87.5 88.2  MCH 27.9 27.3 27.4  MCHC 32.1 31.2 31.1  RDW 19.5* 19.4* 19.3*  PLT 427* 361 415*    Cardiac Enzymes Recent Labs Lab 02/23/17 1202  TROPONINI <0.03    Recent Labs Lab 02/23/17 0006  TROPIPOC 0.02  BNP Recent Labs Lab 02/22/17 2336  BNP 116.2*     DDimer No results for input(s): DDIMER in the last 168 hours.   Radiology    Ct Angio Chest Pe W And/or Wo Contrast  Result Date: 02/23/2017 CLINICAL DATA:  Short of breath, metastatic breast cancer lung cancer. EXAM: CT ANGIOGRAPHY CHEST WITH CONTRAST TECHNIQUE: Multidetector CT imaging of the chest was performed using the standard protocol during bolus administration of intravenous contrast. Multiplanar CT image reconstructions and MIPs were obtained to evaluate the vascular anatomy. CONTRAST:  100 mL Isovue COMPARISON:  CT 01/30/2017 FINDINGS: Cardiovascular: There is a new filling defect within the RIGHT lobe pulmonary artery (image 39, series 8) consistent with acute thromboembolism. Thrombus is occlusive to the more distal RIGHT lower lobe pulmonary arteries. No additional pulmonary emboli. The RIGHT ventricle to LEFT ventricle ratio is less than 1 inconsistent with RIGHT heart strain There is extensive tumor  burden surrounding the pulmonary arteries within LEFT RIGHT hila. Mediastinum/Nodes: Extensive adenopathy surrounding the LEFT or RIGHT main pulmonary arteries as well as the bronchus. Findings similar prior. Lungs/Pleura: The RIGHT lower lobe bronchus is compressed by a tumor (image 77, series 10). There is interval enlargement of a masslike infrahilar lesions on the RIGHT measuring 6.2 cm in maximum coronal dimension (series 6, series 10) compared with 5.6 cm on comparison CT. Significant infrahilar masslike lesion the LEFT additionally. Bilateral multiple discrete pulmonary nodules. Example nodule in the lingula measures 2.2 cm compared to 2.1 (image 36, series 9). LEFT upper lobe nodule measures 15 mm compared to 15 mm. Mass spanning the RIGHT middle lobe measures 4.1 cm in thickness compared to 3.3 (image 53, series 9 Upper Abdomen: Limited view of the liver, kidneys, pancreas are unremarkable. Normal adrenal glands. Musculoskeletal: No aggressive osseous lesion. Review of the MIP images confirms the above findings. IMPRESSION: 1. Acute pulmonary embolism within the RIGHT lower lobe pulmonary artery which is occlusive. Overall clot burden is mild to moderate. No evidence of RIGHT ventricular strain. 2. Interval increase and RIGHT infrahilar mass with obstruction of the RIGHT lower bronchus. 3. Bulky hilar adenopathy on LEFT and RIGHT is similar as well as mediastinal adenopathy. 4. Individual bilateral nodules are similar in size. 5. Increase RIGHT infrahilar and RIGHT middle lobe mass lesions. 6. Overall evidence of progression of pulmonary metastatic disease. Critical Value/emergent results were called by telephone at the time of interpretation on 02/23/2017 at 4:03 pm to Dr. Dyanne Carrel , who verbally acknowledged these results. Electronically Signed   By: Suzy Bouchard M.D.   On: 02/23/2017 16:04    Cardiac Studies   Echo 02/12/17: Study Conclusions  - Left ventricle: The cavity size was severely  dilated. Wall thickness was normal. Systolic function was severely reduced. The estimated ejection fraction was in the range of 25% to 30%. Diffuse hypokinesis. The study is not technically sufficient to allow evaluation of LV diastolic function. - Mitral valve: There was mild regurgitation. - Left atrium: The atrium was moderately dilated. - Right atrium: The atrium was mildly dilated.  LHC 01/24/16: 1. Angiographically normal coronary arteries 2. The left ventricular systolic function is normal.   Patient Profile     46 y.o. 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   Assessment & Plan    1. Chest pain - Likely due to PE. Mostly with cough. No evidence of CAD on cath 01/2016. No evidence of ischemia. Troponin negative. No  further work.   2. Chronic combined CHF - LVEF reduced to 25-30% this admission from 55-60% from 09/2015. This is likely chemotherapy induced or rate related. No evidence of CAD on recent cath.  - Continue lasix and BB. Volume status stable.   3. PAF - intermittent episodes. Currently in sinus rhythm. Continue BB and amiodarone.   4. Acute RLL PE and subacute DVT on LLE - while on Xarelto.  Per oncology and primary team.   Signed, Leanor Kail, PA  02/25/2017, 8:49 AM

## 2017-02-25 NOTE — Progress Notes (Signed)
HEM-ONC Received message from the covering Hem-Onc from weekend reg her admission. I will see her and discuss prognosis once again when I see her at the end of the day today. Thanks Nicholas Lose

## 2017-02-25 NOTE — Progress Notes (Signed)
HEMATOLOGY-ONCOLOGY PROGRESS NOTE  SUBJECTIVE: Breathing much improved. Chest pain better    Breast cancer of upper-outer quadrant of left female breast ([place])   08/17/2015 Initial Diagnosis    left breast biopsy 1:30 position: Invasive ductal carcinoma, grade 3, ER 0%, PR 0%, HER-2 negative ratio 1.48, Ki-67 90%, 3.1 cm tumor, axilla negative, T2 N0 stage II a clinical stage      09/19/2015 - 01/03/2016 Neo-Adjuvant Chemotherapy    Neoadjuvant chemotherapy with dose dense Adriamycin and Cytoxan 4 followed by Carbo/Taxol x 3 cycles (Carbo d/c'd after cycle #3 d/t hospitalization for N&V/dehydration). Went on to complete additional 3 cycles of Taxol alone.       09/28/2015 Procedure     Left breast biopsy anterior third left upper outer quadrant: PASH;  upper inner quadrant: [place]      10/06/2015 Procedure    Genetic testing: Negative. Genes analyzed: ATM, BARD1, BRCA1, BRCA2, BRIP1, CDH1, CHEK2, FANCC, MLH1, MSH2, MSH6, NBN, PALB2, PMS2, PTEN, RAD51C, RAD51D, TP53, and XRCC2.  This panel also includes deletion/duplication analysis (without sequencing) for one gene, EPCAM      01/16/2016 Breast MRI    significant response to chemotherapy with decrease in the tumor from 5.1 cm to 2.9 cm, also decrease a non-mass enhancement      02/13/2016 Surgery    Left lumpectomy ([facility]): IDC grade 3, 3.3 cm, margins negative, 0/3 lymph nodes negative, triple negative, T2 N0 stage II a pathologic stage      02/21/2016 Surgery    Bilateral breast mammoplasty ([place]); no malignancy on path      03/26/2016 - 05/10/2016 Radiation Therapy    Adjuvant radiation therapy Lisbeth Renshaw). Left breast: 50.4 Gy in 28 fractions. Left breast boost: 10 Gy in 5 fractions.       11/13/2016 Imaging    CT chest: Extensive adenopathy throughout anterior mediastinum largest 2.6 cm,Ln at aortic arch 2.8 cm, subcarinal LN 2.8 cm, left hilar LN 1.6 cm, right hilar LN 1.8 cm, lung nodules bilaterally largest right lung 2.6 cm       11/20/2016 Relapse/Recurrence    Lung biopsy left lower quadrant: Metastatic carcinoma consistent with breast      12/04/2016 PET scan    Multiple bilateral lung nodules or masses, 12 mm LUL, to 3 cm masses RML, 2.3 cm posterior left lung base, extensive thoracic lymphadenopathy 2.9 cm prevascular, 2.2 cm subcarinal, 2 cm left hilar, 1.5 cm right infrahilar, 1.5 cm right IMA , additional bone metastases right acetabulum      12/20/2016 -  Chemotherapy    Xeloda 1500 mg by mouth twice a day 2 weeks on one week off        OBJECTIVE: REVIEW OF SYSTEMS:   Constitutional: Denies fevers, chills or abnormal weight loss Eyes: Denies blurriness of vision Ears, nose, mouth, throat, and face: Denies mucositis or sore throat Respiratory: Clear Cardiovascular: Denies palpitation, chest discomfort Gastrointestinal:  Denies nausea, heartburn or change in bowel habits Skin: Denies abnormal skin rashes Neurological:Denies numbness, tingling or new weaknesses Behavioral/Psych: Mood is stable, no new changes  Extremities: No lower extremity edema Complaining of pain in the left leg  All other systems were reviewed with the patient and are negative.  I have reviewed the past medical history, past surgical history, social history and family history with the patient and they are unchanged from previous note.   PHYSICAL EXAMINATION: ECOG PERFORMANCE STATUS: 2 - Symptomatic, <50% confined to bed  Vitals:   02/25/17 0423 02/25/17 1348  BP:  128/66 137/85  Pulse: 87 80  Resp: 18 18  Temp: 98.4 F (36.9 C) 98.4 F (36.9 C)   Filed Weights   02/23/17 1014  Weight: 204 lb 12.9 oz (92.9 kg)    GENERAL:alert, no distress and comfortable SKIN: skin color, texture, turgor are normal, no rashes or significant lesions EYES: normal, Conjunctiva are pink and non-injected, sclera clear OROPHARYNX:no exudate, no erythema and lips, buccal mucosa, and tongue normal  NECK: supple, thyroid normal size,  non-tender, without nodularity LYMPH:  no palpable lymphadenopathy in the cervical, axillary or inguinal LUNGS: Diminished breath sounds in the lung bases  HEART: regular rate & rhythm and no murmurs and no lower extremity edema ABDOMEN:abdomen soft, non-tender and normal bowel sounds Musculoskeletal:no cyanosis of digits and no clubbing  NEURO: alert & oriented x 3 with fluent speech, no focal motor/sensory deficits  LABORATORY DATA:  I have reviewed the data as listed CMP Latest Ref Rng & Units 02/25/2017 02/24/2017 02/22/2017  Glucose 65 - 99 mg/dL 117(H) 95 119(H)  BUN 6 - 20 mg/dL _0 Creatinine 0.44 - 1.00 mg/dL 0.68 0.77 1.04(H)  Sodium 135 - 145 mmol/L 134(L) 133(L) 132(L)  Potassium 3.5 - 5.1 mmol/L 4.4 3.1(L) 3.4(L)  Chloride 101 - 111 mmol/L 101 100(L) 100(L)  CO2 22 - 32 mmol/L _1 Calcium 8.9 - 10.3 mg/dL 9.8 9.7 9.2  Total Protein 6.4 - 8.3 g/dL - - -  Total Bilirubin 0.20 - 1.20 mg/dL - - -  Alkaline Phos 40 - 150 U/L - - -  AST 5 - 34 U/L - - -  ALT 0 - 55 U/L - - -    Lab Results  Component Value Date   WBC 11.3 (H) 02/25/2017   HGB 9.3 (L) 02/25/2017   HCT 29.9 (L) 02/25/2017   MCV 88.2 02/25/2017   PLT 415 (H) 02/25/2017   NEUTROABS 2.6 02/18/2017    ASSESSMENT AND PLAN: 1. acute pulmonary embolism: On anticoagulation 2. metastatic breast cancer: Patient needs to start chemotherapy with [place]. Once she gets discharged in the hospital I would like to bring her back in to administer the chemotherapy. If she gets discharged tomorrow then day after she will need to initiate [place] chemotherapy 3. Goals of care: Palliation patient understands that she cannot be sure of metastatic disease. However she prefers to be full code 4 CHF: On cardiology care. 5. Severe anemia: Normocytic related to chronic disease.

## 2017-02-26 MED ORDER — LOSARTAN POTASSIUM 25 MG PO TABS: 25.0000 mg | ORAL_TABLET | Freq: Every day | ORAL | 1 refills | Status: DC

## 2017-02-26 MED ORDER — FENTANYL 12 MCG/HR TD PT72: 12.5000 ug | MEDICATED_PATCH | TRANSDERMAL | 0 refills | Status: DC

## 2017-02-26 MED ORDER — ENOXAPARIN SODIUM 100 MG/ML ~~LOC~~ SOLN
1.0000 mg/kg | Freq: Two times a day (BID) | SUBCUTANEOUS | Status: DC
  Administered 2017-02-26: 100 mg via SUBCUTANEOUS
  Filled 2017-02-26: qty 1

## 2017-02-26 MED ORDER — HEPARIN SOD (PORK) LOCK FLUSH 100 UNIT/ML IV SOLN
250.0000 [IU] | INTRAVENOUS | Status: AC | PRN
  Administered 2017-02-26: 250 [IU]

## 2017-02-26 MED ORDER — GUAIFENESIN-DM 100-10 MG/5ML PO SYRP: 5.0000 mL | ORAL_SOLUTION | ORAL | 0 refills | Status: AC | PRN

## 2017-02-26 MED ORDER — FUROSEMIDE 40 MG PO TABS: 40.0000 mg | ORAL_TABLET | Freq: Two times a day (BID) | ORAL | 1 refills | Status: DC

## 2017-02-26 MED ORDER — METOPROLOL SUCCINATE ER 50 MG PO TB24: 50.0000 mg | ORAL_TABLET | Freq: Every day | ORAL | 0 refills | Status: DC

## 2017-02-26 MED ORDER — ENOXAPARIN SODIUM 100 MG/ML ~~LOC~~ SOLN: 1.0000 mg/kg | Freq: Two times a day (BID) | SUBCUTANEOUS | 2 refills | Status: AC

## 2017-02-26 NOTE — Care Management Note (Signed)
Case Management Note Marvetta Gibbons RN, BSN Unit 2W-Case Manager [phone]  Patient Details  Name: Cindy Neal. Bradshaw MRN: [id number] Date of Birth: 01/24/1971  Subjective/Objective:   Pt admitted with PE/DVT- hx of breast CA                 Action/Plan: PTA pt; lived at home- plan to return home- plan to d/c on Lovenox- insurance check submitted for coverage info.-  Copay for generic Lovenox is $10/mo- call made to pt's pharmacy to check stock and they do have 100 mg syringes to fill today- spoke with pt at bedside to inform of cost and to let her know that her pharmacy does have syringes in stock for today. No further CM needs noted.   Expected Discharge Date:  02/26/17               Expected Discharge Plan:  Home/Self Care  In-House Referral:     Discharge planning Services  CM Consult, Medication Assistance  Post Acute Care Choice:  NA Choice offered to:  NA  DME Arranged:  [name] DME Agency:  NA  HH Arranged:  NA HH Agency:  NA  Status of Service:  Completed, signed off  If discussed at [place] of Stay Meetings, dates discussed:    Discharge Disposition: home/self care  Additional Comments:  Dawayne Patricia, RN 02/26/2017, 10:26 AM

## 2017-02-26 NOTE — Progress Notes (Signed)
Per insurance check for Lovenox Co-pay for Generic Enoxoparin  is $10.00 for 30 day supply.  No-pre-auth required.  Lovenox 100mg . 30day supply [id number].  No pre-autht required.   Pharmacy [facility]

## 2017-02-26 NOTE — Evaluation (Signed)
Physical Therapy Evaluation Patient Details Name: Cindy Bradshaw MRN: [id number] DOB: [birth year] Today's Date: 02/26/2017   History of Present Illness  Pt is a 46 y/o female admitted secondary to SOB and chest pain. Imaging revealed a R lower lobe PE in the pulmonary artery, a LLE DVT in the popliteal vein, and progression of pulmonary metastic disease. PMH includes metastatic breast cancer, lung cancer, asthma, chemo induced cardiomyopathy and a fib, CHF, and s/p L breast lumpectomy, and portacath placement.   Clinical Impression  Pt is admitted secondary to problems above with deficits below. PTA, pt was independent with functional mobility. Upon evaluation, pt experiencing LLE pain, as well as, decreased endurance and balance. Required use of IV pole and min guard assist for safety with mobility. Pt with some SOB and HR at 95-120 bpm during ambulation. Pt with mild chest pain. RN notified about vitals and symptoms. Educated about use of cane and proper sequencing with use of cane during ambulation. Pt agreeable to use. Recommending cane at d/c and no follow up PT. Will continue to follow acutely to maximize functional mobility independence.     Follow Up Recommendations No PT follow up;Supervision for mobility/OOB    Equipment Recommendations  Cane    Recommendations for Other Services       Precautions / Restrictions Precautions Precautions: Other (comment) Precaution Comments: LLE DVT, R lower lobe PE on lovenox  Restrictions Weight Bearing Restrictions: No      Mobility  Bed Mobility Overal bed mobility: Modified Independent             General bed mobility comments: Increased time, but no assist required   Transfers Overall transfer level: Needs assistance Equipment used: None Transfers: Sit to/from Stand Sit to Stand: Min guard         General transfer comment: Min guard for safety. No external assist required, however,   Ambulation/Gait Ambulation/Gait  assistance: Min guard;Supervision Ambulation Distance (Feet): 100 Feet Assistive device: None (IV pole ) Gait Pattern/deviations: Step-through pattern;Decreased stride length;Drifts right/left;Antalgic Gait velocity: Decreased Gait velocity interpretation: Below normal speed for age/gender General Gait Details: Slow, antalgic gait secondary to LLE pain. Attempted without, however, pt felt she needed to use IV pole for steadying. No LOB noted. Recommended use of cane at home secondary to LLE pain and decreased steadiness without AD. Pt limited by SOB and coughing fits. Required seated rest to recover. Oxygen sats at 94%-95% on RA. HR ranged between 94-120 bpm. Pt reporting mild chest pain at end of gait training. RN notified.   Stairs            Wheelchair Mobility    Modified Rankin (Stroke Patients Only)       Balance Overall balance assessment: Needs assistance Sitting-balance support: No upper extremity supported;Feet supported Sitting balance-Leahy Scale: Good Sitting balance - Comments: Able to apply socks in sitting    Standing balance support: Single extremity supported;During functional activity Standing balance-Leahy Scale: Poor Standing balance comment: Reliant on one UE for support during gait.                              Pertinent Vitals/Pain Pain Assessment: 0-10 Pain Score: 6  Pain Location: LLE and head  Pain Descriptors / Indicators: Headache;Aching Pain Intervention(s): Limited activity within patient's tolerance;Monitored during session;Repositioned    Home Living Family/patient expects to be discharged to:: Private residence Living Arrangements: Children Available Help at Discharge: Family;Available PRN/intermittently Type of  Home: House Home Access: Level entry     Home Layout: One level Home Equipment: Shower seat      Prior Function Level of Independence: Independent               Hand Dominance         Extremity/Trunk Assessment   Upper Extremity Assessment Upper Extremity Assessment: Overall WFL for tasks assessed    Lower Extremity Assessment Lower Extremity Assessment: LLE deficits/detail LLE Deficits / Details: Pain in LLE; DVT in popliteal vein; strength not formally tested but at least 3/5.     Cervical / Trunk Assessment Cervical / Trunk Assessment: Normal  Communication   Communication: No difficulties  Cognition Arousal/Alertness: Awake/alert Behavior During Therapy: WFL for tasks assessed/performed Overall Cognitive Status: Within Functional Limits for tasks assessed                                        General Comments General comments (skin integrity, edema, etc.): Edcuated about proper use of cane at end of session and appropriate sequencing. Pt agreeable to use.     Exercises     Assessment/Plan    PT Assessment Patient needs continued PT services  PT Problem List Decreased activity tolerance;Decreased balance;Cardiopulmonary status limiting activity;Decreased mobility;Decreased knowledge of use of DME;Decreased knowledge of precautions;Pain       PT Treatment Interventions DME instruction;Gait training;Functional mobility training;Therapeutic activities;Therapeutic exercise;Balance training;Neuromuscular re-education;Patient/family education    PT Goals (Current goals can be found in the Care Plan section)  Acute Rehab PT Goals Patient Stated Goal: to go home  PT Goal Formulation: With patient Time For Goal Achievement: 03/05/17 Potential to Achieve Goals: Good    Frequency Min 3X/week   Barriers to discharge        Co-evaluation               AM-PAC PT "6 Clicks" Daily Activity  Outcome Measure Difficulty turning over in bed (including adjusting bedclothes, sheets and blankets)?: A Little Difficulty moving from lying on back to sitting on the side of the bed? : A Little Difficulty sitting down on and standing up from a  chair with arms (e.g., wheelchair, bedside commode, etc,.)?: A Little Help needed moving to and from a bed to chair (including a wheelchair)?: A Little Help needed walking in hospital room?: A Little Help needed climbing 3-5 steps with a railing? : A Lot 6 Click Score: 17    End of Session Equipment Utilized During Treatment: Gait belt Activity Tolerance: Other (comment) (SOB, coughing ) Patient left: in bed;with call bell/phone within reach Nurse Communication: Mobility status;Other (comment) (Vital signs, SOB ) PT Visit Diagnosis: Unsteadiness on feet (R26.81);Pain Pain - Right/Left: Left Pain - part of body: Knee (chest, head)    Time: 6808-8110 PT Time Calculation (min) (ACUTE ONLY): 21 min   Charges:   PT Evaluation $PT Eval Moderate Complexity: 1 Procedure PT Treatments $Gait Training: 8-22 mins   PT G Codes:        Leighton Ruff, PT, DPT  Acute Rehabilitation Services  Pager: [phone]   Rudean Hitt 02/26/2017, 11:46 AM

## 2017-02-26 NOTE — Progress Notes (Addendum)
[place] for Lovenox Indication: pulmonary embolus  Allergies  Allergen Reactions  . Dilaudid [Hydromorphone Hcl] Itching  . Ivp Dye [Iodinated Diagnostic Agents] Itching  . Percocet [Oxycodone-Acetaminophen] Itching  . Tape Itching and Rash  . Toradol [Ketorolac Tromethamine] Itching    Patient Measurements: Height: 5\' 7"  (170.2 cm) Weight: 216 lb 4.3 oz (98.1 kg) IBW/kg (Calculated) : 61.6  Vital Signs: Temp: 98.9 F (37.2 C) (06/26 0300) Temp Source: Oral (06/26 0300) BP: 115/61 (06/26 0300) Pulse Rate: 85 (06/26 0300)  Labs:  Recent Labs  02/23/17 1202 02/24/17 0658 02/25/17 0451  HGB  --  9.2* 9.3*  HCT  --  29.5* 29.9*  PLT  --  361 415*  CREATININE  --  0.77 0.68  TROPONINI <0.03  --   --     Estimated Creatinine Clearance: 104.6 mL/min (by C-G formula based on SCr of 0.68 mg/dL).   Medical History: Past Medical History:  Diagnosis Date  . Arthritis   . Asthma   . Back disorder    degenerative disk disease  . Breast cancer ([place])   . Cancer ([place])   . Cardiomyopathy ([place])    a. Dx 02/2017 - EF 25-30%, felt possibly due to adriamycin +/- tachy mediated from atrial fib/flutter.  [place] GERD (gastroesophageal reflux disease)   . Mammogram abnormal 08/24/15   first  . Migraine   . Normal coronary arteries    01/2016 cath  . PAF (paroxysmal atrial fibrillation) ([place])   . Pap smear for cervical cancer screening 08/12/15  . Paroxysmal atrial flutter ([facility])    Assessment: 46yof on xarelto pta for afib, now with new PE. Given history of breast CA with mets undergoing chemo, will transition to therapeutic lovenox. Last xarelto dose 6/22 @ 2100. Renal function wnl, stable. Weight up a bit to 98.1 - will watch trend.  Goal of Therapy:  Anti-Xa level 0.6-1 units/ml 4hrs after LMWH dose given Monitor platelets by anticoagulation protocol: Yes   Plan:  1) Increase Lovenox to 100mg  (~1mg /kg) [place] q12h 2) CBC at least every 3  days 3) Monitor for s/sx bleeding   Elicia Lamp, PharmD, BCPS Clinical Pharmacist Rx Phone # for today: [phone] After 3:30PM, please call Main Rx: #[id number] 02/26/2017 8:27 AM

## 2017-02-27 NOTE — Discharge Summary (Signed)
Physician Discharge Summary  Cindy Bradshaw Dibbles SNK:[id number] DOB: 02/21/1971 DOA: 02/23/2017  PCP: [name], MD  Admit date: 02/23/2017 Discharge date: 02/26/2017  Admitted From: Home.  Disposition:  Home.   Recommendations for Outpatient Follow-up:  1. Follow up with PCP in 1-2 weeks 2. Please obtain BMP/CBC in one week Please follow up with oncology as recommended.  Please follow up with cardiology as needed.   Discharge Condition:stable.  CODE STATUS: full code.  Diet recommendation: Heart Healthy   Brief/Interim Summary: 46 year old lady with h/o breast cancer with mets to lungs, afib on xarelto, cardiomyopathy with LVEF of 25%, peripheral neuropathy, anemia of chronic disease comes in for chest pain. She underwent CT angio showing pulmonary embolism. Meanwhile, cardiology consulted for chest pain evaluation. Left lower extremity duplex showed a sub acute DVT. Cardiology recommended changing the coreg to metoprolol and adding losartan to her regimen.  She was discharged on lovenox injections.   Discharge Diagnoses:  Principal Problem:   Chest pain Active Problems:   Breast cancer of upper-outer quadrant of left female breast (HCC)   Chemotherapy-induced peripheral neuropathy (HCC)   A-fib (HCC)   Dyspnea   Cardiomyopathy ([place])   Acute pulmonary embolism (HCC)   Chest pain: probably from acute PE.  Improved with morphine, added on fentanyl patch for discharge.   Acute pulmonary embolism: right lower lobe.  Was on xarelto, changed to Lovenox injections.  No right heart strain on CT.  Will have to remain on Lovenox injections from now on.     Cardiomyopathy:  LVEF of 25%.  Resume home meds.  Does not appear to be fluid overload, resume losartan, metoprolol .   PAF: rate controlled. Currently in sinus  In sinus, resume amiodarone , coreg changed to morphine in view of her low LVEF by cardiology.    Chronic systolic heart failure:  Does not  appear to be in fluid overload.  Resume lasix.     Hypokalemia: repleted as needed.    Normocytic anemia:  Hemoglobin baseline appears to be around 9.  Anemia of chronic disease.   Breast cancer: further management as per her oncologist. Dr Lindi Adie.   Discharge Instructions  Discharge Instructions    Diet - low sodium heart healthy    Complete by:  As directed    Discharge instructions    Complete by:  As directed    Please follow up with cardiology in 1 to 2 weeks.  Please follow up with oncology as recommended for chemo.     Allergies as of 02/26/2017      Reactions   Dilaudid [hydromorphone Hcl] Itching   Ivp Dye [iodinated Diagnostic Agents] Itching   Percocet [oxycodone-acetaminophen] Itching   Tape Itching, Rash   Toradol [ketorolac Tromethamine] Itching      Medication List    STOP taking these medications   carvedilol 6.25 MG tablet Commonly known as:  COREG   rivaroxaban 20 MG Tabs tablet Commonly known as:  XARELTO   traMADol 50 MG tablet Commonly known as:  ULTRAM     TAKE these medications   albuterol (2.5 MG/3ML) 0.083% nebulizer solution Commonly known as:  PROVENTIL Take 2.5 mg by nebulization every 6 (six) hours as needed for wheezing or shortness of breath.   albuterol 108 (90 Base) MCG/ACT inhaler Commonly known as:  PROVENTIL HFA;VENTOLIN HFA Inhale 1-2 puffs into the lungs every 6 (six) hours as needed for wheezing or shortness of breath.   amiodarone 200 MG tablet Commonly known  as:  PACERONE Take 200-400 mg by mouth See admin instructions. Take 400 mg by mouth twice daily for 7 days, then take 200 mg by mouth twice daily for 14 days, then take 200 mg by mouth daily   cholecalciferol 1000 units tablet Commonly known as:  VITAMIN D Take 1,000 Units by mouth daily.   dexamethasone 4 MG tablet Commonly known as:  DECADRON Take 1 tablet (4 mg total) by mouth daily.   enoxaparin 100 MG/ML injection Commonly known as:   LOVENOX Inject 1 mL (100 mg total) into the skin every 12 (twelve) hours.   fentaNYL 12 MCG/HR Commonly known as:  DURAGESIC - dosed mcg/hr Place 1 patch (12.5 mcg total) onto the skin every 3 (three) days.   ferrous sulfate 325 (65 FE) MG EC tablet Take 325 mg by mouth 3 (three) times daily with meals.   furosemide 40 MG tablet Commonly known as:  LASIX Take 1 tablet (40 mg total) by mouth 2 (two) times daily. What changed:  when to take this   guaiFENesin-dextromethorphan 100-10 MG/5ML syrup Commonly known as:  ROBITUSSIN DM Take 5 mLs by mouth every 4 (four) hours as needed for cough.   losartan 25 MG tablet Commonly known as:  COZAAR Take 1 tablet (25 mg total) by mouth daily.   Melatonin 10 MG Tabs Take 10 mg by mouth at bedtime.   metoprolol succinate 50 MG 24 hr tablet Commonly known as:  TOPROL-XL Take 1 tablet (50 mg total) by mouth daily. Take with or immediately following a meal.   morphine 30 MG tablet Commonly known as:  MSIR Take 30 mg by mouth every 4 (four) hours as needed for severe pain.   ondansetron 8 MG tablet Commonly known as:  ZOFRAN Take 1 tablet (8 mg total) by mouth 2 (two) times daily as needed (Nausea or vomiting).   potassium chloride SA 20 MEQ tablet Commonly known as:  K-DUR,KLOR-CON Take 1 tablet (20 mEq total) by mouth daily.   senna 8.6 MG Tabs tablet Commonly known as:  SENOKOT Take 1 tablet by mouth daily as needed for mild constipation.   tobramycin-dexamethasone ophthalmic solution Commonly known as:  TOBRADEX Place 2 drops into both eyes every 4 (four) hours while awake.      Follow-up Information    [name], MD. Schedule an appointment as soon as possible for a visit in 1 week.   Specialty:  Internal Medicine Why:  PLEASE CALL TO SCHEDULE Contact information: [address] [place] [address] [phone]          Allergies  Allergen Reactions  . Dilaudid [Hydromorphone Hcl]  Itching  . Ivp Dye [Iodinated Diagnostic Agents] Itching  . Percocet [Oxycodone-Acetaminophen] Itching  . Tape Itching and Rash  . Toradol [Ketorolac Tromethamine] Itching    Consultations:  Cardiology  oncology   Procedures/Studies: Dg Chest 2 View  Result Date: 02/23/2017 CLINICAL DATA:  Acute onset of cough, shortness of breath and generalized chest pain. Nausea and vomiting. Initial encounter. EXAM: CHEST  2 VIEW COMPARISON:  Chest radiograph performed 02/21/2017 FINDINGS: The lungs are well-aerated. The patient's bilateral pulmonary metastases are again noted. No definite superimposed pneumonia is seen. There is no evidence of pleural effusion or pneumothorax. A right PICC is noted ending about the mid to distal SVC. The heart is mildly enlarged. No acute osseous abnormalities are seen. Clips are noted at the left axilla. IMPRESSION: Bilateral pulmonary metastases again noted. No superimposed focal airspace consolidation  seen. Mild cardiomegaly. Electronically Signed   By: Garald Balding M.D.   On: 02/23/2017 00:14   Dg Chest 2 View  Result Date: 02/21/2017 CLINICAL DATA:  Shortness of breath and dry cough for 4 days. EXAM: CHEST  2 VIEW COMPARISON:  February 12, 2017, CT chest Jan 30, 2017 FINDINGS: The heart size and mediastinal contours are stable. The heart size is enlarged. Bilateral lung masses are identified consistent with patient's known metastatic disease as seen on prior chest CT. There is minimal left pleural effusion. There is no pulmonary edema. The visualized skeletal structures are stable. IMPRESSION: Bilateral lung masses consistent with patient's known metastatic disease. Minimal left pleural effusion. Electronically Signed   By: Abelardo Diesel M.D.   On: 02/21/2017 16:28   Dg Chest 2 View  Result Date: 02/12/2017 CLINICAL DATA:  Chest x-ray to cup the ventilation and perfusion lung scan today. EXAM: CHEST  2 VIEW COMPARISON:  Chest x-ray of Jan 24, 2017 FINDINGS: The lungs  are borderline hypoinflated. There patchy alveolar opacities in both lungs not greatly changed from the previous study. The cardiac silhouette is enlarged. The AP window region is prominent. The PICC line tip projects over the midportion of the SVC. The bony thorax is unremarkable. IMPRESSION: Persistent bilateral alveolar opacities consistent known metastatic disease. Cardiomegaly without pulmonary edema. Soft tissue fullness in the AP window region consistent with known lymphadenopathy. Electronically Signed   By: David  Martinique M.D.   On: 02/12/2017 11:46   Ct Chest W Contrast  Result Date: 01/30/2017 CLINICAL DATA:  Metastatic left breast cancer EXAM: CT CHEST, ABDOMEN, AND PELVIS WITH CONTRAST TECHNIQUE: Multidetector CT imaging of the chest, abdomen and pelvis was performed following the standard protocol during bolus administration of intravenous contrast. CONTRAST:  15m ISOVUE-300 IOPAMIDOL (ISOVUE-300) INJECTION 61% COMPARISON:  12/04/2016 FINDINGS: CT CHEST FINDINGS Cardiovascular: No acute aortic findings. Extrinsic mass effect on portions of the pulmonary arterial tree due to extensive hilar and mediastinal adenopathy. Mediastinum/Nodes: Extensive than worsened prevascular, AP window, paratracheal, hilar, infrahilar, and internal mammary adenopathy. Conglomerate AP window adenopathy measures 3.6 cm in short axis, previously 2.8 cm. A subcarinal node measures 2.6 cm in short axis, previously 2.2 cm. The size comparisons are made to the prior PET-CT from 12/04/2016. Prior left axillary dissection. Lungs/Pleura: Compared to the prior PET-CT, there is increase in size and number of scattered pulmonary nodules and masses. For example, a pleural-based mass along the lingula measures 1.9 by 2.2 cm, previously 1.5 by 1.0 cm. A masslike density along the right hemidiaphragm measures 8.0 by 5.5 cm on image 93/4, previously 3.0 by 2.3 cm. One example of a new nodule is the 5 mm pleural-based nodule on image 42  series 4 along the right upper lobe. There many an additional new pulmonary nodules. Trace bilateral pleural effusions with pleural masses favoring malignant effusions. Musculoskeletal: New and enlarging nodules and masses along the right breast. A right inferior breast mass measures 4.3 by 3.8 cm on image 42/2 and was previously 1.4 cm in diameter. A medial mass in the right breast measures 2.8 by 1.7 cm on image 32/2 and was previously 0.8 cm in diameter. A new nodule centrally in the right breast measures 1.0 cm in diameter on image 21/2. Other breast and subcutaneous nodules are present on the right. Thoracic spondylosis. CT ABDOMEN PELVIS FINDINGS Hepatobiliary: Mild intrahepatic biliary dilatation. Common bile duct measures 1.2 cm in diameter. No focal hepatic lesion identified. Pancreas: Unremarkable Spleen: Unremarkable Adrenals/Urinary Tract: Unremarkable Stomach/Bowel:  Prominent stool throughout the colon favors constipation. Vascular/Lymphatic: Unremarkable Reproductive: Unremarkable Other: Scattered new subcutaneous nodules along the abdomen are worrisome for subcutaneous deposits of tumor. An index lesion measures 1.5 by 0.9 cm on image 73 of series 2. These are new compared to the prior exam. Musculoskeletal: Lumbar spondylosis and degenerative disc disease with right foraminal impingement at L5-S1 due to spurring, image 93/6. Transitional S1 vertebra. IMPRESSION: 1. Worsened tumor burden with significant abnormal nodal enlargement in the chest; new and enlarged pulmonary nodules and masses; new and enlarged right breast and subcutaneous masses and nodules, compatible with metastatic disease. 2. Trace bilateral pleural effusions may be malignant given the visible pleural nodules. 3.  Prominent stool throughout the colon favors constipation. 4. Mild biliary dilatation, some of which may be a physiologic response to cholecystectomy. 5. Right foraminal impingement at L5-S1 due to spurring. Electronically  Signed   By: Van Clines M.D.   On: 01/30/2017 15:18   Ct Angio Chest Pe W And/or Wo Contrast  Result Date: 02/23/2017 CLINICAL DATA:  Short of breath, metastatic breast cancer lung cancer. EXAM: CT ANGIOGRAPHY CHEST WITH CONTRAST TECHNIQUE: Multidetector CT imaging of the chest was performed using the standard protocol during bolus administration of intravenous contrast. Multiplanar CT image reconstructions and MIPs were obtained to evaluate the vascular anatomy. CONTRAST:  100 mL Isovue COMPARISON:  CT 01/30/2017 FINDINGS: Cardiovascular: There is a new filling defect within the RIGHT lobe pulmonary artery (image 39, series 8) consistent with acute thromboembolism. Thrombus is occlusive to the more distal RIGHT lower lobe pulmonary arteries. No additional pulmonary emboli. The RIGHT ventricle to LEFT ventricle ratio is less than 1 inconsistent with RIGHT heart strain There is extensive tumor burden surrounding the pulmonary arteries within LEFT RIGHT hila. Mediastinum/Nodes: Extensive adenopathy surrounding the LEFT or RIGHT main pulmonary arteries as well as the bronchus. Findings similar prior. Lungs/Pleura: The RIGHT lower lobe bronchus is compressed by a tumor (image 77, series 10). There is interval enlargement of a masslike infrahilar lesions on the RIGHT measuring 6.2 cm in maximum coronal dimension (series 6, series 10) compared with 5.6 cm on comparison CT. Significant infrahilar masslike lesion the LEFT additionally. Bilateral multiple discrete pulmonary nodules. Example nodule in the lingula measures 2.2 cm compared to 2.1 (image 36, series 9). LEFT upper lobe nodule measures 15 mm compared to 15 mm. Mass spanning the RIGHT middle lobe measures 4.1 cm in thickness compared to 3.3 (image 53, series 9 Upper Abdomen: Limited view of the liver, kidneys, pancreas are unremarkable. Normal adrenal glands. Musculoskeletal: No aggressive osseous lesion. Review of the MIP images confirms the above  findings. IMPRESSION: 1. Acute pulmonary embolism within the RIGHT lower lobe pulmonary artery which is occlusive. Overall clot burden is mild to moderate. No evidence of RIGHT ventricular strain. 2. Interval increase and RIGHT infrahilar mass with obstruction of the RIGHT lower bronchus. 3. Bulky hilar adenopathy on LEFT and RIGHT is similar as well as mediastinal adenopathy. 4. Individual bilateral nodules are similar in size. 5. Increase RIGHT infrahilar and RIGHT middle lobe mass lesions. 6. Overall evidence of progression of pulmonary metastatic disease. Critical Value/emergent results were called by telephone at the time of interpretation on 02/23/2017 at 4:03 pm to Dr. Dyanne Carrel , who verbally acknowledged these results. Electronically Signed   By: Suzy Bouchard M.D.   On: 02/23/2017 16:04   Ct Abdomen Pelvis W Contrast  Result Date: 01/30/2017 CLINICAL DATA:  Metastatic left breast cancer EXAM: CT CHEST, ABDOMEN, AND PELVIS  WITH CONTRAST TECHNIQUE: Multidetector CT imaging of the chest, abdomen and pelvis was performed following the standard protocol during bolus administration of intravenous contrast. CONTRAST:  17m ISOVUE-300 IOPAMIDOL (ISOVUE-300) INJECTION 61% COMPARISON:  12/04/2016 FINDINGS: CT CHEST FINDINGS Cardiovascular: No acute aortic findings. Extrinsic mass effect on portions of the pulmonary arterial tree due to extensive hilar and mediastinal adenopathy. Mediastinum/Nodes: Extensive than worsened prevascular, AP window, paratracheal, hilar, infrahilar, and internal mammary adenopathy. Conglomerate AP window adenopathy measures 3.6 cm in short axis, previously 2.8 cm. A subcarinal node measures 2.6 cm in short axis, previously 2.2 cm. The size comparisons are made to the prior PET-CT from 12/04/2016. Prior left axillary dissection. Lungs/Pleura: Compared to the prior PET-CT, there is increase in size and number of scattered pulmonary nodules and masses. For example, a pleural-based  mass along the lingula measures 1.9 by 2.2 cm, previously 1.5 by 1.0 cm. A masslike density along the right hemidiaphragm measures 8.0 by 5.5 cm on image 93/4, previously 3.0 by 2.3 cm. One example of a new nodule is the 5 mm pleural-based nodule on image 42 series 4 along the right upper lobe. There many an additional new pulmonary nodules. Trace bilateral pleural effusions with pleural masses favoring malignant effusions. Musculoskeletal: New and enlarging nodules and masses along the right breast. A right inferior breast mass measures 4.3 by 3.8 cm on image 42/2 and was previously 1.4 cm in diameter. A medial mass in the right breast measures 2.8 by 1.7 cm on image 32/2 and was previously 0.8 cm in diameter. A new nodule centrally in the right breast measures 1.0 cm in diameter on image 21/2. Other breast and subcutaneous nodules are present on the right. Thoracic spondylosis. CT ABDOMEN PELVIS FINDINGS Hepatobiliary: Mild intrahepatic biliary dilatation. Common bile duct measures 1.2 cm in diameter. No focal hepatic lesion identified. Pancreas: Unremarkable Spleen: Unremarkable Adrenals/Urinary Tract: Unremarkable Stomach/Bowel: Prominent stool throughout the colon favors constipation. Vascular/Lymphatic: Unremarkable Reproductive: Unremarkable Other: Scattered new subcutaneous nodules along the abdomen are worrisome for subcutaneous deposits of tumor. An index lesion measures 1.5 by 0.9 cm on image 73 of series 2. These are new compared to the prior exam. Musculoskeletal: Lumbar spondylosis and degenerative disc disease with right foraminal impingement at L5-S1 due to spurring, image 93/6. Transitional S1 vertebra. IMPRESSION: 1. Worsened tumor burden with significant abnormal nodal enlargement in the chest; new and enlarged pulmonary nodules and masses; new and enlarged right breast and subcutaneous masses and nodules, compatible with metastatic disease. 2. Trace bilateral pleural effusions may be malignant  given the visible pleural nodules. 3.  Prominent stool throughout the colon favors constipation. 4. Mild biliary dilatation, some of which may be a physiologic response to cholecystectomy. 5. Right foraminal impingement at L5-S1 due to spurring. Electronically Signed   By: WVan ClinesM.D.   On: 01/30/2017 15:18   Nm Pulmonary Perf And Vent  Result Date: 02/12/2017 CLINICAL DATA:  Metastatic breast cancer and COPD. Short of breath. Irregular heart rate. EXAM: NUCLEAR MEDICINE VENTILATION - PERFUSION LUNG SCAN TECHNIQUE: Ventilation images were obtained in multiple projections using inhaled aerosol [id number]. Perfusion images were obtained in multiple projections after intravenous injection of Tc-9930mA. RADIOPHARMACEUTICALS:  32.4 MCi Technetium-65m45mA aerosol inhalation and 4.3 mCi Technetium-65m 53mIV COMPARISON:  PET-CT, 12/04/2016 FINDINGS: Ventilation: There are patchy areas of decreased ventilation in both lungs. Perfusion: There are patchy areas of nonsegmental decreased profusion, which correspond to the areas of decreased ventilation. There are no segmental perfusion defects, mismatched to  ventilation, to suggest a pulmonary thromboembolism. IMPRESSION: 1. No evidence of pulmonary thromboembolism. There matched nonsegmental ventilation and perfusion abnormalities which are likely from the known metastatic disease. Electronically Signed   By: Lajean Manes M.D.   On: 02/12/2017 11:48   Ir Fluoro Guide Cv Line Right  Result Date: 02/11/2017 INDICATION: 46 year old female with a history of metastatic breast carcinoma. She presents for a port catheter placement to initiate chemotherapy. Upon presentation atrial fibrillation with RVR was discovered, which is her first presentation. No history of cardiac problems. Plan is for PICC placement with future port. EXAM: PICC LINE PLACEMENT WITH ULTRASOUND AND FLUOROSCOPIC GUIDANCE MEDICATIONS: None ANESTHESIA/SEDATION: None FLUOROSCOPY TIME:   Fluoroscopy Time: 0 minutes 6 seconds COMPLICATIONS: None PROCEDURE: Informed written consent was obtained from the patient after a thorough discussion of the procedural risks, benefits and alternatives. All questions were addressed. Maximal Sterile Barrier Technique was utilized including caps, mask, sterile gowns, sterile gloves, sterile drape, hand hygiene and skin antiseptic. A timeout was performed prior to the initiation of the procedure. Patient was position in the supine position on the fluoroscopy table with the right arm abducted 90 degrees. Ultrasound survey of the upper extremity was performed with images stored and sent to PACs. The right brachial vein was selected for access. Once the patient was prepped and draped in the usual sterile fashion, the skin and subcutaneous tissues were generously infiltrated with 1% lidocaine for local anesthesia. A micropuncture access kit was then used to access the targeted vein. Wire was passed centrally, confirmed to be within the venous system under fluoroscopy. A small stab incision was made with an 11 blade scalpel and the sheath was then placed over the wire. Estimated length of the catheter was then performed with the indwelling wire. Catheter was amputated at 41 cm length and placed with coaxial wire through the peel-away. Double-lumen, power injectable PICC in the brachial vein. Tip confirmed at the cavoatrial junction, and the catheter is ready for use. Stat lock was placed. Patient tolerated the procedure well and remained hemodynamically stable throughout. No complications were encountered and no significant blood loss was encountered. IMPRESSION: Status post right brachial vein PICC.  Catheter ready for use. Signed, Dulcy Fanny. [name], DO [facility] Radiology Electronically Signed   By: Corrie Mckusick D.O.   On: 02/11/2017 13:10   Ir US Guide Vasc Access Right  Result Date: 02/11/2017 INDICATION:  46 year old female with a history of metastatic breast carcinoma. She presents for a port catheter placement to initiate chemotherapy. Upon presentation atrial fibrillation with RVR was discovered, which is her first presentation. No history of cardiac problems. Plan is for PICC placement with future port. EXAM: PICC LINE PLACEMENT WITH ULTRASOUND AND FLUOROSCOPIC GUIDANCE MEDICATIONS: None ANESTHESIA/SEDATION: None FLUOROSCOPY TIME:  Fluoroscopy Time: 0 minutes 6 seconds COMPLICATIONS: None PROCEDURE: Informed written consent was obtained from the patient after a thorough discussion of the procedural risks, benefits and alternatives. All questions were addressed. Maximal Sterile Barrier Technique was utilized including caps, mask, sterile gowns, sterile gloves, sterile drape, hand hygiene and skin antiseptic. A timeout was performed prior to the initiation of the procedure. Patient was position in the supine position on the fluoroscopy table with the right arm abducted 90 degrees. Ultrasound survey of the upper extremity was performed with images stored and sent to PACs. The right brachial vein was selected for access. Once the patient was prepped and draped in the usual sterile fashion, the skin and subcutaneous tissues were  generously infiltrated with 1% lidocaine for local anesthesia. A micropuncture access kit was then used to access the targeted vein. Wire was passed centrally, confirmed to be within the venous system under fluoroscopy. A small stab incision was made with an 11 blade scalpel and the sheath was then placed over the wire. Estimated length of the catheter was then performed with the indwelling wire. Catheter was amputated at 41 cm length and placed with coaxial wire through the peel-away. Double-lumen, power injectable PICC in the brachial vein. Tip confirmed at the cavoatrial junction, and the catheter is ready for use. Stat lock was placed. Patient tolerated the procedure well and remained  hemodynamically stable throughout. No complications were encountered and no significant blood loss was encountered. IMPRESSION: Status post right brachial vein PICC.  Catheter ready for use. Signed, Dulcy Fanny. [name], DO [facility] Radiology Electronically Signed   By: Corrie Mckusick D.O.   On: 02/11/2017 13:10       Subjective: No new complaints.   Discharge Exam: Vitals:   02/26/17 0300 02/26/17 1258  BP: 115/61 138/82  Pulse: 85 86  Resp: 16 20  Temp: 98.9 F (37.2 C) 98.3 F (36.8 C)   Vitals:   02/25/17 1348 02/25/17 2107 02/26/17 0300 02/26/17 1258  BP: 137/85 114/72 115/61 138/82  Pulse: 80 86 85 86  Resp: _0 Temp: 98.4 F (36.9 C) 98.8 F (37.1 C) 98.9 F (37.2 C) 98.3 F (36.8 C)  TempSrc: Oral Oral Oral Oral  SpO2: 96% 95% 94% 96%  Weight:   98.1 kg (216 lb 4.3 oz)   Height:        General: Pt is alert, awake, not in acute distress Cardiovascular: RRR, S1/S2 +, no rubs, no gallops Respiratory: CTA bilaterally, no wheezing, no rhonchi Abdominal: Soft, NT, ND, bowel sounds + Extremities: no edema, no cyanosis    The results of significant diagnostics from this hospitalization (including imaging, microbiology, ancillary and laboratory) are listed below for reference.     Microbiology: Recent Results (from the past 240 hour(s))  TECHNOLOGIST REVIEW     Status: None   Collection Time: 02/18/17  8:39 AM  Result Value Ref Range Status   Technologist Review 2% metamyelocytes, 1% myelocyte.  Final     Labs: BNP (last 3 results)  Recent Labs  03/15/16 1411 09/20/16 1858 02/22/17 2336  BNP 20.2 33.9 355.9*   Basic Metabolic Panel:  Recent Labs Lab 02/22/17 2336 02/23/17 1202 02/24/17 0658 02/25/17 0451  NA 132*  --  133* 134*  K 3.4*  --  3.1* 4.4  CL 100*  --  100* 101  CO2 23  --  27 26  GLUCOSE 119*  --  95 117*  BUN 16  --  14 13  CREATININE 1.04*  --  0.77 0.68  CALCIUM 9.2  --   9.7 9.8  MG  --  1.8  --   --   PHOS  --  2.5  --   --    Liver Function Tests: No results for input(s): AST, ALT, ALKPHOS, BILITOT, PROT, ALBUMIN in the last 168 hours. No results for input(s): LIPASE, AMYLASE in the last 168 hours. No results for input(s): AMMONIA in the last 168 hours. CBC:  Recent Labs Lab 02/22/17 2336 02/24/17 0658 02/25/17 0451  WBC 9.3 10.0 11.3*  HGB 10.1* 9.2* 9.3*  HCT 31.5* 29.5* 29.9*  MCV 87.0 87.5 88.2  PLT 427* 361 415*  Cardiac Enzymes:  Recent Labs Lab 02/23/17 1202  TROPONINI <0.03   BNP: Invalid input(s): POCBNP CBG: No results for input(s): GLUCAP in the last 168 hours. D-Dimer No results for input(s): DDIMER in the last 72 hours. Hgb A1c No results for input(s): HGBA1C in the last 72 hours. Lipid Profile No results for input(s): CHOL, HDL, LDLCALC, TRIG, CHOLHDL, LDLDIRECT in the last 72 hours. Thyroid function studies No results for input(s): TSH, T4TOTAL, T3FREE, THYROIDAB in the last 72 hours.  Invalid input(s): FREET3 Anemia work up No results for input(s): VITAMINB12, FOLATE, FERRITIN, TIBC, IRON, RETICCTPCT in the last 72 hours. Urinalysis    Component Value Date/Time   COLORURINE YELLOW 07/03/2016 0557   APPEARANCEUR CLOUDY (A) 07/03/2016 0557   LABSPEC 1.021 07/03/2016 0557   LABSPEC 1.010 04/10/2016 1224   PHURINE 6.0 07/03/2016 0557   GLUCOSEU NEGATIVE 07/03/2016 0557   GLUCOSEU Negative 04/10/2016 1224   HGBUR SMALL (A) 07/03/2016 0557   BILIRUBINUR NEGATIVE 07/03/2016 0557   BILIRUBINUR Negative 04/10/2016 [place] 07/03/2016 0557   PROTEINUR NEGATIVE 07/03/2016 0557   UROBILINOGEN 0.2 04/10/2016 1224   NITRITE NEGATIVE 07/03/2016 0557   LEUKOCYTESUR NEGATIVE 07/03/2016 0557   LEUKOCYTESUR Negative 04/10/2016 1224   Sepsis Labs Invalid input(s): PROCALCITONIN,  WBC,  LACTICIDVEN Microbiology Recent Results (from the past 240 hour(s))  TECHNOLOGIST REVIEW     Status: None    Collection Time: 02/18/17  8:39 AM  Result Value Ref Range Status   Technologist Review 2% metamyelocytes, 1% myelocyte.  Final     Time coordinating discharge: Over 30 minutes  SIGNED:   Hosie Poisson, MD  [facility] 02/27/2017, 9:10 AM Pager   If 7PM-7AM, please contact night-coverage [email or web address] Password [email or web address]

## 2017-02-28 ENCOUNTER — Ambulatory Visit: Payer: Federal, State, Local not specified - PPO

## 2017-02-28 ENCOUNTER — Encounter: Payer: Self-pay | Admitting: Hematology and Oncology

## 2017-02-28 ENCOUNTER — Other Ambulatory Visit (HOSPITAL_BASED_OUTPATIENT_CLINIC_OR_DEPARTMENT_OTHER): Payer: Federal, State, Local not specified - PPO

## 2017-02-28 ENCOUNTER — Ambulatory Visit (HOSPITAL_BASED_OUTPATIENT_CLINIC_OR_DEPARTMENT_OTHER): Payer: Federal, State, Local not specified - PPO | Admitting: Hematology and Oncology

## 2017-02-28 ENCOUNTER — Ambulatory Visit (HOSPITAL_BASED_OUTPATIENT_CLINIC_OR_DEPARTMENT_OTHER): Payer: Federal, State, Local not specified - PPO

## 2017-02-28 DIAGNOSIS — R0602 Shortness of breath: Secondary | ICD-10-CM | POA: Diagnosis not present

## 2017-02-28 DIAGNOSIS — C50412 Malignant neoplasm of upper-outer quadrant of left female breast: Secondary | ICD-10-CM | POA: Diagnosis not present

## 2017-02-28 DIAGNOSIS — C7801 Secondary malignant neoplasm of right lung: Secondary | ICD-10-CM

## 2017-02-28 DIAGNOSIS — C771 Secondary and unspecified malignant neoplasm of intrathoracic lymph nodes: Secondary | ICD-10-CM

## 2017-02-28 DIAGNOSIS — C7802 Secondary malignant neoplasm of left lung: Secondary | ICD-10-CM | POA: Diagnosis not present

## 2017-02-28 DIAGNOSIS — I4891 Unspecified atrial fibrillation: Secondary | ICD-10-CM | POA: Diagnosis not present

## 2017-02-28 DIAGNOSIS — Z171 Estrogen receptor negative status [ER-]: Secondary | ICD-10-CM | POA: Diagnosis not present

## 2017-02-28 DIAGNOSIS — I509 Heart failure, unspecified: Secondary | ICD-10-CM

## 2017-02-28 DIAGNOSIS — C7951 Secondary malignant neoplasm of bone: Secondary | ICD-10-CM | POA: Diagnosis not present

## 2017-02-28 LAB — COMPREHENSIVE METABOLIC PANEL
ALBUMIN: 2.4 g/dL — AB (ref 3.5–5.0)
ALK PHOS: 172 U/L — AB (ref 40–150)
ALT: 89 U/L — ABNORMAL HIGH (ref 0–55)
ANION GAP: 9 meq/L (ref 3–11)
AST: 99 U/L — ABNORMAL HIGH (ref 5–34)
BILIRUBIN TOTAL: 0.9 mg/dL (ref 0.20–1.20)
BUN: 23.4 mg/dL (ref 7.0–26.0)
CALCIUM: 10.1 mg/dL (ref 8.4–10.4)
CO2: 27 mEq/L (ref 22–29)
Chloride: 99 mEq/L (ref 98–109)
Creatinine: 0.8 mg/dL (ref 0.6–1.1)
Glucose: 106 mg/dl (ref 70–140)
POTASSIUM: 4 meq/L (ref 3.5–5.1)
Sodium: 136 mEq/L (ref 136–145)
TOTAL PROTEIN: 7.2 g/dL (ref 6.4–8.3)

## 2017-02-28 LAB — CBC WITH DIFFERENTIAL/PLATELET
BASO%: 0.2 % (ref 0.0–2.0)
BASOS ABS: 0 10*3/uL (ref 0.0–0.1)
EOS ABS: 0 10*3/uL (ref 0.0–0.5)
EOS%: 0.2 % (ref 0.0–7.0)
HEMATOCRIT: 34.1 % — AB (ref 34.8–46.6)
HEMOGLOBIN: 10.7 g/dL — AB (ref 11.6–15.9)
LYMPH#: 1.2 10*3/uL (ref 0.9–3.3)
LYMPH%: 11.6 % — ABNORMAL LOW (ref 14.0–49.7)
MCH: 27.9 pg (ref 25.1–34.0)
MCHC: 31.4 g/dL — ABNORMAL LOW (ref 31.5–36.0)
MCV: 88.8 fL (ref 79.5–101.0)
MONO#: 0.8 10*3/uL (ref 0.1–0.9)
MONO%: 7.4 % (ref 0.0–14.0)
NEUT#: 8.2 10*3/uL — ABNORMAL HIGH (ref 1.5–6.5)
NEUT%: 80.6 % — ABNORMAL HIGH (ref 38.4–76.8)
PLATELETS: 408 10*3/uL — AB (ref 145–400)
RBC: 3.84 10*6/uL (ref 3.70–5.45)
RDW: 19.5 % — AB (ref 11.2–14.5)
WBC: 10.1 10*3/uL (ref 3.9–10.3)

## 2017-02-28 MED ORDER — PROCHLORPERAZINE MALEATE 10 MG PO TABS
10.0000 mg | ORAL_TABLET | Freq: Once | ORAL | Status: AC
  Administered 2017-02-28: 10 mg via ORAL

## 2017-02-28 MED ORDER — HEPARIN SOD (PORK) LOCK FLUSH 100 UNIT/ML IV SOLN
250.0000 [IU] | Freq: Once | INTRAVENOUS | Status: AC | PRN
  Administered 2017-02-28: 250 [IU]
  Filled 2017-02-28: qty 5

## 2017-02-28 MED ORDER — SODIUM CHLORIDE 0.9% FLUSH
10.0000 mL | INTRAVENOUS | Status: DC | PRN
  Administered 2017-02-28: 10 mL
  Filled 2017-02-28: qty 10

## 2017-02-28 MED ORDER — SODIUM CHLORIDE 0.9 % IV SOLN
3.0000 mg | Freq: Once | INTRAVENOUS | Status: AC
  Administered 2017-02-28: 3 mg via INTRAVENOUS
  Filled 2017-02-28: qty 6

## 2017-02-28 MED ORDER — SODIUM CHLORIDE 0.9 % IV SOLN
Freq: Once | INTRAVENOUS | Status: AC
  Administered 2017-02-28: 14:00:00 via INTRAVENOUS

## 2017-02-28 MED ORDER — PROCHLORPERAZINE MALEATE 10 MG PO TABS
ORAL_TABLET | ORAL | Status: AC
  Filled 2017-02-28: qty 1

## 2017-02-28 NOTE — Progress Notes (Signed)
Ok to treat with AST and ALT today per MD Lindi Adie

## 2017-02-28 NOTE — Patient Instructions (Addendum)
[facility] Discharge Instructions for Patients Receiving Chemotherapy  Today you received the following chemotherapy agents: Halaven   To help prevent nausea and vomiting after your treatment, we encourage you to take your nausea medication as directed.  If you develop nausea and vomiting that is not controlled by your nausea medication, call the clinic.   BELOW ARE SYMPTOMS THAT SHOULD BE REPORTED IMMEDIATELY:  *FEVER GREATER THAN 100.5 F  *CHILLS WITH OR WITHOUT FEVER  NAUSEA AND VOMITING THAT IS NOT CONTROLLED WITH YOUR NAUSEA MEDICATION  *UNUSUAL SHORTNESS OF BREATH  *UNUSUAL BRUISING OR BLEEDING  TENDERNESS IN MOUTH AND THROAT WITH OR WITHOUT PRESENCE OF ULCERS  *URINARY PROBLEMS  *BOWEL PROBLEMS  UNUSUAL RASH Items with * indicate a potential emergency and should be followed up as soon as possible.  Feel free to call the clinic you have any questions or concerns. The clinic phone number is [phone].  Please show the [facility] at check-in to the Emergency Department and triage nurse.  Eribulin solution for injection (Halaven) What is this medicine? ERIBULIN (er e bu lin) is a chemotherapy drug. It is used to treat breast cancer and liposarcoma. This medicine may be used for other purposes; ask your health care provider or pharmacist if you have questions. COMMON BRAND NAME(S): Halaven What should I tell my health care provider before I take this medicine? They need to know if you have any of these conditions: -heart disease -history of irregular heartbeat -kidney disease -liver disease -low blood counts, like low white cell, platelet, or red cell counts -low levels of potassium or magnesium in the blood -an unusual or allergic reaction to eribulin, other medicines, foods, dyes, or preservatives -pregnant or trying to get pregnant -breast-feeding How should I use this medicine? This medicine is for infusion into a vein. It is given by  a health care professional in a hospital or clinic setting. Talk to your pediatrician regarding the use of this medicine in children. Special care may be needed. Overdosage: If you think you have taken too much of this medicine contact a poison control center or emergency room at once. NOTE: This medicine is only for you. Do not share this medicine with others. What if I miss a dose? It is important not to miss your dose. Call your doctor or health care professional if you are unable to keep an appointment. What may interact with this medicine? Do not take this medicine with any of the following medications: -amiodarone -astemizole -arsenic trioxide -bepridil -bretylium -chloroquine -chlorpromazine -cisapride -clarithromycin -dextromethorphan, quinidine -disopyramide -dofetilide -droperidol -dronedarone -erythromycin -grepafloxacin -halofantrine -haloperidol -ibutilide -levomethadyl -mesoridazine -methadone -pentamidine -procainamide -quinidine -pimozide -posaconazole -probucol -propafenone -saquinavir -sotalol -sparfloxacin -terfenadine -thioridazine -troleandomycin -ziprasidone This list may not describe all possible interactions. Give your health care provider a list of all the medicines, herbs, non-prescription drugs, or dietary supplements you use. Also tell them if you smoke, drink alcohol, or use illegal drugs. Some items may interact with your medicine. What should I watch for while using this medicine? This drug may make you feel generally unwell. This is not uncommon, as chemotherapy can affect healthy cells as well as cancer cells. Report any side effects. Continue your course of treatment even though you feel ill unless your doctor tells you to stop. Call your doctor or health care professional for advice if you get a fever, chills or sore throat, or other symptoms of a cold or flu. Do not treat yourself. This drug decreases your  body's ability to fight  infections. Try to avoid being around people who are sick. This medicine may increase your risk to bruise or bleed. Call your doctor or health care professional if you notice any unusual bleeding. You may need blood work done while you are taking this medicine. Do not become pregnant while taking this medicine or for 2 weeks after stopping it. Women should inform their doctor if they wish to become pregnant or think they might be pregnant. Men should not father a child while taking this medicine and for 3.5 months after stopping it. There is a potential for serious side effects to an unborn child. Talk to your health care professional or pharmacist for more information. Do not breast-feed an infant while taking this medicine or for 2 weeks after stopping it. What side effects may I notice from receiving this medicine? Side effects that you should report to your doctor or health care professional as soon as possible: -allergic reactions like skin rash, itching or hives, swelling of the face, lips, or tongue -low blood counts - this medicine may decrease the number of white blood cells, red blood cells and platelets. You may be at increased risk for infections and bleeding. -signs of infection - fever or chills, cough, sore throat, pain or difficulty passing urine -signs of decreased platelets or bleeding - bruising, pinpoint red spots on the skin, black, tarry stools, blood in the urine -signs of decreased red blood cells - unusually weak or tired, fainting spells, lightheadedness -pain, tingling, numbness in the hands or feet Side effects that usually do not require medical attention (report to your doctor or health care professional if they continue or are bothersome): -constipation -hair loss -headache -loss of appetite -muscle or joint pain -nausea, vomiting -stomach pain This list may not describe all possible side effects. Call your doctor for medical advice about side effects. You may  report side effects to FDA at 1-800-FDA-1088. Where should I keep my medicine? This drug is given in a hospital or clinic and will not be stored at home. NOTE: This sheet is a summary. It may not cover all possible information. If you have questions about this medicine, talk to your doctor, pharmacist, or health care provider.  2018 Elsevier/Gold Standard (2015-09-22 10:11:26)

## 2017-02-28 NOTE — Assessment & Plan Note (Signed)
Left lumpectomy 02/13/2016: IDC grade 3, 3.3 cm, margins negative, 0/3 lymph nodes negative, triple negative, T2 N0 stage II a pathologic stage  Left breast biopsy 08/17/2015:1:30 position: Invasive ductal carcinoma, grade 3, ER 0%, PR 0%, HER-2 negative ratio 1.48, Ki-67 90%, 3.1 cm tumor, axilla negative, T2 N0 stage II a clinical stage Breast biopsies x 2: 09/28/2015 : [facility]  Treatment summary: 1. Completed Cycle 4 dose dense Adriamycin and Cytoxan on 10/31/15; completed cycle 3 Taxol and carboplatin, completed 7 cycles of Taxol and treatment discontinued for poor tolerance. 2. left lumpectomy 02/13/2016  3. Adjuvant radiation 03/26/2016 to09/07/2016 Patient was offered treatment with Xeloda as well as immunotherapy clinical trial. She refused both of those options. ----------------------------------------------------------------------------------------------------------------------------------------------------- Relapse: CT chest 11/13/2016 revealed multiple lung nodules and mediastinal lymph nodes 11/20/2016: Lung biopsy: Metastatic breast cancer triple negative PET/CT scan 12/04/2016: Multiple bilateral lung nodules or masses, 12 mm LUL, to 3 cm masses RML, 2.3 cm posterior left lung base, extensive thoracic lymphadenopathy 2.9 cm prevascular, 2.2 cm subcarinal, 2 cm left hilar, 1.5 cm right infrahilar, 1.5 cm right IMA , additional bone metastases right acetabulum CT chest 01/30/2017 demonstrates worsened tumor burden with significant abnormal nodal enlargement in chest, new and enlarged pulmonary nodules and masses, new and enlarged right breast and subcutaneous masses and nodules, trace bilateral pleural effusions  Xeloda from April to June, stopped due to progression  -----------------------------------------------------------------------------------------------------------------------------------------------------  Cindy Bradshaw was supposed to start Eribulin today for her metastatic  triple negative breast cancer, however, due to Atrial fibrillation at 130, we will not treat today.  I spoke with Dr. Daniel Bensimhon regarding her, and the fact that she has no symptoms, and reviewed patient's vital signs with him.  After discussion with Dr. Bensimhon, Cindy Bradshaw will continue on Amiodarone 400mg PO BID and NOT TAPER, she will increase Carvedilol to 6.25mg per day.  Magnesium was added to today's labs.  These orders were covered with patient in detail and she was given written instructions of her medication list, and what to take.  Should she become symptomatic, or worsen in any way, shape or form, she was instructed to go to the [facility].  Patient and her daughter verbalized understanding of the plan.    Cindy Bradshaw will follow up with Dr. Bensimhon on Thursday, 02/21/2017.  I gave them the details of this appointment.   

## 2017-02-28 NOTE — Progress Notes (Signed)
Pt okay to treat with AST ALT elevated per [name].

## 2017-02-28 NOTE — Progress Notes (Signed)
Patient Care Team: [name], MD as PCP - General (Internal Medicine) Nicholas Lose, MD as Consulting Physician (Hematology and Oncology)  DIAGNOSIS:  Encounter Diagnosis  Name Primary?  . Malignant neoplasm of upper-outer quadrant of left breast in female, estrogen receptor negative ([place])     SUMMARY OF ONCOLOGIC HISTORY:   Breast cancer of upper-outer quadrant of left female breast ([place])   08/17/2015 Initial Diagnosis    left breast biopsy 1:30 position: Invasive ductal carcinoma, grade 3, ER 0%, PR 0%, HER-2 negative ratio 1.48, Ki-67 90%, 3.1 cm tumor, axilla negative, T2 N0 stage II a clinical stage      09/19/2015 - 01/03/2016 Neo-Adjuvant Chemotherapy    Neoadjuvant chemotherapy with dose dense Adriamycin and Cytoxan 4 followed by Carbo/Taxol x 3 cycles (Carbo d/c'd after cycle #3 d/t hospitalization for N&V/dehydration). Went on to complete additional 3 cycles of Taxol alone.       09/28/2015 Procedure     Left breast biopsy anterior third left upper outer quadrant: [facility];  upper inner quadrant: [place]      10/06/2015 Procedure    Genetic testing: Negative. Genes analyzed: ATM, BARD1, BRCA1, BRCA2, BRIP1, CDH1, CHEK2, FANCC, MLH1, MSH2, MSH6, NBN, PALB2, PMS2, PTEN, RAD51C, RAD51D, TP53, and XRCC2.  This panel also includes deletion/duplication analysis (without sequencing) for one gene, EPCAM      01/16/2016 Breast MRI    significant response to chemotherapy with decrease in the tumor from 5.1 cm to 2.9 cm, also decrease a non-mass enhancement      02/13/2016 Surgery    Left lumpectomy ([facility]): IDC grade 3, 3.3 cm, margins negative, 0/3 lymph nodes negative, triple negative, T2 N0 stage II a pathologic stage      02/21/2016 Surgery    Bilateral breast mammoplasty ([place]); no malignancy on path      03/26/2016 - 05/10/2016 Radiation Therapy    Adjuvant radiation therapy Lisbeth Renshaw). Left breast: 50.4 Gy in 28 fractions. Left breast boost: 10 Gy in 5 fractions.         11/13/2016 Imaging    CT chest: Extensive adenopathy throughout anterior mediastinum largest 2.6 cm,Ln at aortic arch 2.8 cm, subcarinal LN 2.8 cm, left hilar LN 1.6 cm, right hilar LN 1.8 cm, lung nodules bilaterally largest right lung 2.6 cm      11/20/2016 Relapse/Recurrence    Lung biopsy left lower quadrant: Metastatic carcinoma consistent with breast      12/04/2016 PET scan    Multiple bilateral lung nodules or masses, 12 mm LUL, to 3 cm masses RML, 2.3 cm posterior left lung base, extensive thoracic lymphadenopathy 2.9 cm prevascular, 2.2 cm subcarinal, 2 cm left hilar, 1.5 cm right infrahilar, 1.5 cm right IMA , additional bone metastases right acetabulum      12/20/2016 - 01/28/2017 Chemotherapy    Xeloda 1500 mg by mouth twice a day 2 weeks on one week off       02/28/2017 -  Chemotherapy    Halaven palliative chemotherapy         CHIEF COMPLIANT: Cycle 1 Halaven  INTERVAL HISTORY: Cindy Bradshaw is a 46 year old with above-mentioned is metastatic triple negative breast cancer who is here to receive her first cycle of chemotherapy with Halaven. Recently she was in the hospital with shortness of breath and was diagnosed with the very low ejection fraction and congestive heart failure. She was treated with significant improvement in her respiratory symptoms. She is here today to start her first cycle of Halaven. She continues  to have intermittent chronic cough. She also has headaches.  REVIEW OF SYSTEMS:   Constitutional: Denies fevers, chills or abnormal weight loss Eyes: Denies blurriness of vision Ears, nose, mouth, throat, and face: Denies mucositis or sore throat Respiratory: Shortness of breath to minimal exertion Cardiovascular: Denies palpitation, chest discomfort Gastrointestinal:  Denies nausea, heartburn or change in bowel habits Skin: Denies abnormal skin rashes Lymphatics: Denies new lymphadenopathy or easy bruising Neurological:  Headaches Behavioral/Psych: Mood is stable, no new changes  Extremities: No lower extremity edema  All other systems were reviewed with the patient and are negative.  I have reviewed the past medical history, past surgical history, social history and family history with the patient and they are unchanged from previous note.  ALLERGIES:  is allergic to dilaudid [hydromorphone hcl]; ivp dye [iodinated diagnostic agents]; percocet [oxycodone-acetaminophen]; tape; and toradol [ketorolac tromethamine].  MEDICATIONS:  Current Outpatient Prescriptions  Medication Sig Dispense Refill  . albuterol (PROVENTIL HFA;VENTOLIN HFA) 108 (90 Base) MCG/ACT inhaler Inhale 1-2 puffs into the lungs every 6 (six) hours as needed for wheezing or shortness of breath. 1 Inhaler 0  . albuterol (PROVENTIL) (2.5 MG/3ML) 0.083% nebulizer solution Take 2.5 mg by nebulization every 6 (six) hours as needed for wheezing or shortness of breath.    Marland Kitchen amiodarone (PACERONE) 200 MG tablet Take 200-400 mg by mouth See admin instructions. Take 400 mg by mouth twice daily for 7 days, then take 200 mg by mouth twice daily for 14 days, then take 200 mg by mouth daily    . cholecalciferol (VITAMIN D) 1000 units tablet Take 1,000 Units by mouth daily.    Marland Kitchen dexamethasone (DECADRON) 4 MG tablet Take 1 tablet (4 mg total) by mouth daily. 30 tablet 0  . enoxaparin (LOVENOX) 100 MG/ML injection Inject 1 mL (100 mg total) into the skin every 12 (twelve) hours. 30 Syringe 2  . fentaNYL (DURAGESIC - DOSED MCG/HR) 12 MCG/HR Place 1 patch (12.5 mcg total) onto the skin every 3 (three) days. 5 patch 0  . ferrous sulfate 325 (65 FE) MG EC tablet Take 325 mg by mouth 3 (three) times daily with meals.    . furosemide (LASIX) 40 MG tablet Take 1 tablet (40 mg total) by mouth 2 (two) times daily. 30 tablet 1  . guaiFENesin-dextromethorphan (ROBITUSSIN DM) 100-10 MG/5ML syrup Take 5 mLs by mouth every 4 (four) hours as needed for cough. 118 mL 0  .  losartan (COZAAR) 25 MG tablet Take 1 tablet (25 mg total) by mouth daily. 30 tablet 1  . Melatonin 10 MG TABS Take 10 mg by mouth at bedtime.     . metoprolol succinate (TOPROL-XL) 50 MG 24 hr tablet Take 1 tablet (50 mg total) by mouth daily. Take with or immediately following a meal. 30 tablet 0  . morphine (MSIR) 30 MG tablet Take 30 mg by mouth every 4 (four) hours as needed for severe pain.    Marland Kitchen ondansetron (ZOFRAN) 8 MG tablet Take 1 tablet (8 mg total) by mouth 2 (two) times daily as needed (Nausea or vomiting). 30 tablet 1  . potassium chloride SA (K-DUR,KLOR-CON) 20 MEQ tablet Take 1 tablet (20 mEq total) by mouth daily. 30 tablet 3  . senna (SENOKOT) 8.6 MG TABS tablet Take 1 tablet by mouth daily as needed for mild constipation.    Marland Kitchen tobramycin-dexamethasone (TOBRADEX) ophthalmic solution Place 2 drops into both eyes every 4 (four) hours while awake. 5 mL 0   No current facility-administered medications  for this visit.    Facility-Administered Medications Ordered in Other Visits  Medication Dose Route Frequency Provider Last Rate Last Dose  . 0.9 %  sodium chloride infusion   Intravenous Once Boelter, Heather F, NP      . sodium chloride flush (NS) 0.9 % injection 10 mL  10 mL Intracatheter PRN Nicholas Lose, MD   10 mL at 02/28/17 1540    PHYSICAL EXAMINATION: ECOG PERFORMANCE STATUS: 1 - Symptomatic but completely ambulatory  Vitals:   02/28/17 1312  BP: 124/69  Pulse: (!) 57  Resp: 16  Temp: 99.3 F (37.4 C)   There were no vitals filed for this visit.  GENERAL:alert, no distress and comfortable SKIN: skin color, texture, turgor are normal, no rashes or significant lesions EYES: normal, Conjunctiva are pink and non-injected, sclera clear OROPHARYNX:no exudate, no erythema and lips, buccal mucosa, and tongue normal  NECK: supple, thyroid normal size, non-tender, without nodularity LYMPH:  no palpable lymphadenopathy in the cervical, axillary or inguinal LUNGS: clear  to auscultation and percussion with normal breathing effort HEART: regular rate & rhythm and no murmurs and no lower extremity edema ABDOMEN:abdomen soft, non-tender and normal bowel sounds MUSCULOSKELETAL:no cyanosis of digits and no clubbing  NEURO: alert & oriented x 3 with fluent speech, no focal motor/sensory deficits EXTREMITIES: No lower extremity edema  LABORATORY DATA:  I have reviewed the data as listed   Chemistry      Component Value Date/Time   NA 136 02/28/2017 1232   K 4.0 02/28/2017 1232   CL 101 02/25/2017 0451   CO2 27 02/28/2017 1232   BUN 23.4 02/28/2017 1232   CREATININE 0.8 02/28/2017 1232      Component Value Date/Time   CALCIUM 10.1 02/28/2017 1232   ALKPHOS 172 (H) 02/28/2017 1232   AST 99 (H) 02/28/2017 1232   ALT 89 (H) 02/28/2017 1232   BILITOT 0.90 02/28/2017 1232       Lab Results  Component Value Date   WBC 10.1 02/28/2017   HGB 10.7 (L) 02/28/2017   HCT 34.1 (L) 02/28/2017   MCV 88.8 02/28/2017   PLT 408 (H) 02/28/2017   NEUTROABS 8.2 (H) 02/28/2017    ASSESSMENT & PLAN:  Breast cancer of upper-outer quadrant of left female breast ([place]) Left lumpectomy 02/13/2016: IDC grade 3, 3.3 cm, margins negative, 0/3 lymph nodes negative, triple negative, T2 N0 stage II a pathologic stage  Left breast biopsy 08/17/2015:1:30 position: Invasive ductal carcinoma, grade 3, ER 0%, PR 0%, HER-2 negative ratio 1.48, Ki-67 90%, 3.1 cm tumor, axilla negative, T2 N0 stage II a clinical stage Breast biopsies x 2: 09/28/2015 : [place]  Treatment summary: 1. Completed Cycle 4 dose dense Adriamycin and Cytoxan on 10/31/15; completed cycle 3 Taxol and carboplatin, completed 7 cycles of Taxol and treatment discontinued for poor tolerance. 2. left lumpectomy 02/13/2016  3. Adjuvant radiation 03/26/2016 to09/07/2016 Patient was offered treatment with Xeloda as well as immunotherapy clinical trial. She refused both of those  options. ----------------------------------------------------------------------------------------------------------------------------------------------------- Relapse: CT chest 11/13/2016 revealed multiple lung nodules and mediastinal lymph nodes 11/20/2016: Lung biopsy: Metastatic breast cancer triple negative PET/CT scan 12/04/2016: Multiple bilateral lung nodules or masses, 12 mm LUL, to 3 cm masses RML, 2.3 cm posterior left lung base, extensive thoracic lymphadenopathy 2.9 cm prevascular, 2.2 cm subcarinal, 2 cm left hilar, 1.5 cm right infrahilar, 1.5 cm right IMA , additional bone metastases right acetabulum CT chest 01/30/2017 demonstrates worsened tumor burden with significant abnormal nodal enlargement in chest, new  and enlarged pulmonary nodules and masses, new and enlarged right breast and subcutaneous masses and nodules, trace bilateral pleural effusions  Xeloda from April to June, stopped due to progression  ----------------------------------------------------------------------------------------------------------------------------------------------------- Current treatment: Cycle 1 day 1 Halaven Antiemetics were discussed  Shortness of breath Atrial fibrillation CHF Being monitored by cardiology  Return to clinic in one week for toxicity check and follow-up   I spent 25 minutes talking to the patient of which more than half was spent in counseling and coordination of care.  No orders of the defined types were placed in this encounter.  The patient has a good understanding of the overall plan. she agrees with it. she will call with any problems that may develop before the next visit here.   Rulon Eisenmenger, MD 02/28/17

## 2017-03-04 NOTE — Progress Notes (Deleted)
Cardiology Office Note    Date:  03/04/2017  ID:  Cindy Bradshaw, Cindy Bradshaw Apr 29, 1971, MRN [id number] PCP:  [name], MD  Cardiologist:  Dr. Johnsie Cancel, [place]   Chief Complaint: ***  History of Present Illness:  Cindy Bradshaw is a 46 y.o. female with history of breast cancer dx 2016 with metastasis to lungs found 11/2016, normal coronaries 01/2016, asthma, GERD, recently diagnosed cardiomyopathy with EF 25-30% felt likely due to adriamycin +/- tachy-induced from atrial fib RVR/atrial flutter, recent readmission with DVT/PE/progressive mets who presents back for follow-up.  She has h/o prior echo 09/2015 with normal EF, and admission in 01/2017 for chest pain with abnormal nuc and subsequent cardiac cath 01/23/17 with normal cors and normal LV function (suspected false-positive nuc). She was found to have breast CA in 08/2015 and underwent chemo with dose dense Adriamycin and Cytoxan x 4 cycles and then Carbo/Taxol x 3 cycles but did not tolerate Carbo and that was stopped after cycle #3 and had 3 additional cycles of Taxol alone. No repeat echo was done. She also was in a clinical immunotherapy trial with Pembrolizumab. She had adjuvant radiation 03/2016 - 05/2016. In3/2018, she was found to have metastatic disease to the lungs and bone. Started Chemo 12/2016 through recently with Xeloda but unfortunately disease has progressed and plan was to start Eribulin on 6/11. CT chest 01/2017 showed worsened tumor burden with significant abnormal nodal enlargement and enlarged pulmonary nodules and masses. She presented for port a cath on 02/11/17 but was found to have afib with RVR on arrival and was sent to the ER. She was also found to go in and out of atrial flutter was well, with reference to evidence of paroxymal atrial flutter with RVR on EKGs in May 2018. Diflucan was stopped and she was placed on amiodarone. Xarelto was added. VQ scan was low probability for PE. 2D echo 02/12/17 showed EF  25-30%, mild MR, mod LAE, mild RAE. During that admission she also had sharp CP felt due to GERD. She saw cardio-onc CHF clinic on 02/21/17 at which time her Reds Vest reading was 40 (normal 20-35% per Bensimhon's note) although she did not appear overtly volume overloaded. It was unclear if lung mets were altering her vest reading. She was given Lasix 40mg  IV x 1, 6meq of potassium and started on maintenance Lasix 40mg  daily. Close f/u with oncology was strongly encouraged. Severe cough has been noted for the last several months. She presented back to the hospital 02/24/17 due to severe persistent chest pain and progressive SOB. She was in atrial fib, but HR was relatively controlled and eventually converted to NSR during admission. CT of the chest showed acute RLL PE (no evidence of RV strain) and progression of pulmonary metastatic disease with obstruction of right lower bronchus). She was also found to have LLE DVT and was changed from Xarelto to Lovenox. BB was changed to Toprol and losartan 25mg  daily was started. She followed up with oncology and started Halaven palliative chemo on 02/28/17. Labs 02/28/17 showed persistently elevated LFTs, Cr 0.90, Hgb 10.7, Plt 408.      Chronic systolic CHF Paroxysmal atrial fib Paroxysmal atrial flutter Recently diagnosed PE    Past Medical History:  Diagnosis Date  . Arthritis   . Asthma   . Back disorder    degenerative disk disease  . Breast cancer ([place])   . Cancer ([place])   . Cardiomyopathy ([place])    a. Dx 02/2017 - EF  25-30%, felt possibly due to adriamycin +/- tachy mediated from atrial fib/flutter.  Marland Kitchen GERD (gastroesophageal reflux disease)   . Mammogram abnormal 08/24/15   first  . Migraine   . Normal coronary arteries    01/2016 cath  . PAF (paroxysmal atrial fibrillation) ([place])   . Pap smear for cervical cancer screening 08/12/15  . Paroxysmal atrial flutter [facility])     Past Surgical History:  Procedure Laterality Date  . BREAST LUMPECTOMY  WITH RADIOACTIVE SEED AND SENTINEL LYMPH NODE BIOPSY Left 02/13/2016   Procedure: BREAST LUMPECTOMY WITH RADIOACTIVE SEED AND SENTINEL LYMPH NODE BIOPSY;  Surgeon: Excell Seltzer, MD;  Location: [place];  Service: General;  Laterality: Left;  . BREAST REDUCTION SURGERY Bilateral 02/21/2016   Procedure: MAMMARY REDUCTION  (BREAST)/Oncoplastic breast reconstruction;  Surgeon: Irene Limbo, MD;  Location: [place];  Service: Plastics;  Laterality: Bilateral;  . CARDIAC CATHETERIZATION [name] 01/24/2016   Procedure: Left Heart Cath and Coronary Angiography;  Surgeon: Leonie Man, MD;  Location: [place] CV [facility];  Service: Cardiovascular;  Laterality: [name];  . CESAREAN SECTION    . CHOLECYSTECTOMY    . IR FLUORO GUIDE CV LINE RIGHT  02/11/2017  . IR US GUIDE VASC ACCESS RIGHT  02/11/2017  . PERIPHERAL VASCULAR CATHETERIZATION Right 02/13/2016   Procedure: PORTA CATH REMOVAL;  Surgeon: Excell Seltzer, MD;  Location: [place];  Service: General;  Laterality: Right;  . PORTACATH PLACEMENT [name] 09/12/2015   Procedure: INSERTION PORT-A-CATH;  Surgeon: Excell Seltzer, MD;  Location: [facility];  Service: General;  Laterality: [name];    Current Medications: No outpatient prescriptions have been marked as taking for the 03/05/17 encounter (Appointment) with Charlie Pitter, PA-C.     Allergies:   Dilaudid [hydromorphone hcl]; Ivp dye [iodinated diagnostic agents]; Percocet [oxycodone-acetaminophen]; Tape; and Toradol [ketorolac tromethamine]   Social History   Social History  . Marital status: Legally Separated    Spouse name: Chrissie Noa  . Number of children: 2  . Years of education: [name]   Occupational History  . Web designer    Social History Main Topics  . Smoking status: Never Smoker  . Smokeless tobacco: Never Used  . Alcohol use 0.0 oz/week     Comment: occassional glass of wine  . Drug use: No  . Sexual activity: Yes   Other  Topics Concern  . Not on file   Social History Narrative   ** Merged History Encounter **       First MP age 40.  LMP 08/26/15.     2 children carried to term.  Age at first live birth 66   H/o oral contrasception use        Family History:  Family History  Problem Relation Age of Onset  . Lung cancer Mother 44       2 different types of lung cancer; metastasis to brain; smoker  . Other Mother        hx of hysterectomy for unspecified reason  . Bone cancer Maternal Uncle        dx. early 52s  . Breast cancer Maternal Grandmother        dx. early 47s, s/p mastectomy  . Cancer Paternal Grandfather        unspecified type of cancer, dx. late 5s  . Congestive Heart Failure Father        smoker  . Stroke Father   . Eczema Father   . Cirrhosis Maternal Grandfather   .  Heart Problems Maternal Grandfather   . Asthma Daughter   . Allergies Daughter        hives  . Lung cancer Other        (maternal great uncle; MGM's brother); had a coal stove  . Cancer Cousin        unspecified type; d. early age (paternal first cousin once-removed)  . Cancer Cousin        dx. as a kid; in remission today; (paternal 2nd cousin)  . Cancer Cousin        unspecified type; d. early 30s; (maternal 1st cousin)   ***  ROS:   Please see the history of present illness. Otherwise, review of systems is positive for ***.  All other systems are reviewed and otherwise negative.    PHYSICAL EXAM:   VS:  There were no vitals taken for this visit.  BMI: There is no height or weight on file to calculate BMI. GEN: Well nourished, well developed, in no acute distress  HEENT: normocephalic, atraumatic Neck: no JVD, carotid bruits, or masses Cardiac: ***RRR; no murmurs, rubs, or gallops, no edema  Respiratory:  clear to auscultation bilaterally, normal work of breathing GI: soft, nontender, nondistended, + BS MS: no deformity or atrophy  Skin: warm and dry, no rash Neuro:  Alert and Oriented x 3,  Strength and sensation are intact, follows commands Psych: euthymic mood, full affect  Wt Readings from Last 3 Encounters:  02/26/17 216 lb 4.3 oz (98.1 kg)  02/21/17 207 lb (93.9 kg)  02/18/17 206 lb 3.2 oz (93.5 kg)      Studies/Labs Reviewed:   EKG:  EKG was ordered today and personally reviewed by me and demonstrates *** EKG was not ordered today.***  Recent Labs: 02/11/2017: TSH 0.453 02/22/2017: B Natriuretic Peptide 116.2 02/23/2017: Magnesium 1.8 02/28/2017: ALT 89; BUN 23.4; Creatinine 0.8; HGB 10.7; Platelets 408; Potassium 4.0; Sodium 136   Lipid Panel No results found for: CHOL, TRIG, HDL, CHOLHDL, VLDL, LDLCALC, LDLDIRECT  Additional studies/ records that were reviewed today include: Summarized above.***    ASSESSMENT & PLAN:   1. ***  Disposition: F/u with ***   Medication Adjustments/Labs and Tests Ordered: Current medicines are reviewed at length with the patient today.  Concerns regarding medicines are outlined above. Medication changes, Labs and Tests ordered today are summarized above and listed in the Patient Instructions accessible in Encounters.   Signed, Charlie Pitter, PA-C  03/04/2017 4:40 PM    [facility] [place], [place], [place]  [address] Phone: [phone]; Fax: [phone]

## 2017-03-05 ENCOUNTER — Ambulatory Visit (HOSPITAL_COMMUNITY)
Admission: RE | Admit: 2017-03-05 | Discharge: 2017-03-05 | Disposition: A | Discharge status: Home / Self Care | Payer: Federal, State, Local not specified - PPO | Source: Ambulatory Visit | Attending: Internal Medicine | Admitting: Internal Medicine

## 2017-03-05 ENCOUNTER — Encounter (HOSPITAL_COMMUNITY): Payer: Self-pay

## 2017-03-05 ENCOUNTER — Ambulatory Visit: Payer: Federal, State, Local not specified - PPO | Admitting: Physician Assistant

## 2017-03-05 VITALS — BP 132/96 | HR 78 | Wt 199.4 lb

## 2017-03-05 DIAGNOSIS — I4891 Unspecified atrial fibrillation: Secondary | ICD-10-CM

## 2017-03-05 DIAGNOSIS — R062 Wheezing: Secondary | ICD-10-CM | POA: Insufficient documentation

## 2017-03-05 DIAGNOSIS — I5022 Chronic systolic (congestive) heart failure: Secondary | ICD-10-CM

## 2017-03-05 DIAGNOSIS — I429 Cardiomyopathy, unspecified: Secondary | ICD-10-CM | POA: Insufficient documentation

## 2017-03-05 DIAGNOSIS — I480 Paroxysmal atrial fibrillation: Secondary | ICD-10-CM | POA: Insufficient documentation

## 2017-03-05 DIAGNOSIS — I498 Other specified cardiac arrhythmias: Secondary | ICD-10-CM | POA: Diagnosis not present

## 2017-03-05 DIAGNOSIS — I110 Hypertensive heart disease with heart failure: Secondary | ICD-10-CM | POA: Diagnosis not present

## 2017-03-05 DIAGNOSIS — K219 Gastro-esophageal reflux disease without esophagitis: Secondary | ICD-10-CM | POA: Diagnosis not present

## 2017-03-05 DIAGNOSIS — C50412 Malignant neoplasm of upper-outer quadrant of left female breast: Secondary | ICD-10-CM | POA: Diagnosis not present

## 2017-03-05 DIAGNOSIS — Z853 Personal history of malignant neoplasm of breast: Secondary | ICD-10-CM | POA: Insufficient documentation

## 2017-03-05 DIAGNOSIS — J45909 Unspecified asthma, uncomplicated: Secondary | ICD-10-CM | POA: Insufficient documentation

## 2017-03-05 DIAGNOSIS — Z7901 Long term (current) use of anticoagulants: Secondary | ICD-10-CM | POA: Diagnosis not present

## 2017-03-05 LAB — BASIC METABOLIC PANEL
ANION GAP: 10 (ref 5–15)
BUN: 19 mg/dL (ref 6–20)
CALCIUM: 10 mg/dL (ref 8.9–10.3)
CO2: 24 mmol/L (ref 22–32)
Chloride: 101 mmol/L (ref 101–111)
Creatinine, Ser: 0.69 mg/dL (ref 0.44–1.00)
Glucose, Bld: 112 mg/dL — ABNORMAL HIGH (ref 65–99)
Potassium: 4.2 mmol/L (ref 3.5–5.1)
SODIUM: 135 mmol/L (ref 135–145)

## 2017-03-05 MED ORDER — LOSARTAN POTASSIUM 50 MG PO TABS: 50.0000 mg | ORAL_TABLET | Freq: Every day | ORAL | 3 refills | Status: DC

## 2017-03-05 MED ORDER — FUROSEMIDE 40 MG PO TABS: 40.0000 mg | ORAL_TABLET | ORAL | 3 refills | Status: DC

## 2017-03-05 NOTE — Patient Instructions (Addendum)
Labs today (will call for abnormal results, otherwise no news is good news)  INCREASE Losartan to 50 mg (1 Tablet) Once daily  DECREASE Lasix to 40 mg (1 Tablet) once Every other day.  Take Amiodarone 200 mg Twice Daily  Follow up with Dr. Haroldine Laws in 2 months

## 2017-03-05 NOTE — Progress Notes (Signed)
Advanced Heart Failure Outpatient Consult   PCP: Dr Ivette Loyal Primary Cardiologist: Dr Radford Pax  Oncologist: Dr Lindi Adie  HPI:  Cindy Bradshaw is a 46 year old with a history of  L upper breast cancer with mets to lungs, GERD, and recent new onset A fib in June of this year.    Treatment summary: 1. Completed Cycle 4 dose dense Adriamycin and Cytoxan on 10/31/15; completed cycle 3 Taxol and carboplatin, completed 7 cycles of Taxol and treatment discontinued for poor tolerance. 2. left lumpectomy 02/13/2016  3. Adjuvant radiation 03/26/2016 to09/11/2017Breast Cancer treatment  Ad[facility] to [facility]/11/17 with new onset a fib and new onset systolic heart failure. Started on amio + coreg with conversion to NSR. VQ scan was negative for PE. ECHO showed newly reduced EF 25-30%. Discharged on amio 400 mg twice a day, coreg 3.125 mg twice a day and xarelto 20 mg daily.  Discharged on 02/14/17 in NSR.   She returned to Oncology for post hospital visit on 02/16/2017 and was back in A fib RVR 130s. She was asymptomatic and continued on amio 400 mg twice a day and carvedilol was increased to 6.25 mg twice a day.   She was seen in the CHF clinic on 02/21/17, volume overloaded given IV lasix in the clinic.   Returns today for HF follow up. Weight down 8 pounds, feels ok. Still with fatigue, but knows that she is not in afib because when she is she is extremely fatigued and SOB. Will get EKG to confirm NSR. She is taking all medications, plans for chemo (Halaven) this week. She denies SOB, chest pain and palpitations.   Cardiac Studies Echocardiogram 02/12/17: Study Conclusions - Left ventricle: The cavity size was severely dilated. Wall thickness was normal. Systolic function was severely reduced. The estimated ejection fraction was in the range of 25% to 30%. Diffuse hypokinesis. The study is not technically sufficient to allow evaluation of LV diastolic function. - Mitral valve: There was mild  regurgitation. - Left atrium: The atrium was moderately dilated. - Right atrium: The atrium was mildly dilated.  Echocardiogram 09/13/15: Study Conclusions - Left ventricle: The cavity size was normal. There was mild focal basal hypertrophy of the septum. Systolic function was normal. The estimated ejection fraction was in the range of 55% to 60%. Wall motion was normal; there were no regional wall motion abnormalities. Global longitudinal strain: -18.4% There was no evidence of elevated ventricular filling pressure by Doppler parameters.   Left heart cath 01/24/16: 1. Angiographically normal coronary arteries 2. The left ventricular systolic function is normal. Essentially normal coronary arteries with no significant coronary disease noted to explain abnormal stress test.  Suspect breast attenuation is the explanation for the false-positive stress test. I suspect noncardiac chest pain  Myoview 01/23/16: 1. Positive for moderate size region of reversible ischemia in the anteroseptal wall of the mid ventricle extending through the apex to the true apex. 2. Normal left ventricular wall motion. 3. Left ventricular ejection fraction 54% 4. Non invasive risk stratification*: Intermediate   ROS: All systems negative except as listed in HPI, PMH and Problem List.  SH:  Social History   Social History  . Marital status: Legally Separated    Spouse name: Cindy Bradshaw  . Number of children: 2  . Years of education: [name]   Occupational History  . Web designer    Social History Main Topics  . Smoking status: Never Smoker  . Smokeless tobacco: Never Used  . Alcohol use 0.0 oz/week  Comment: occassional glass of wine  . Drug use: No  . Sexual activity: Yes   Other Topics Concern  . Not on file   Social History Narrative   ** Merged History Encounter **       First MP age 3.  LMP 08/26/15.     2 children carried to term.  Age at first live birth 67    H/o oral contrasception use       FH:  Family History  Problem Relation Age of Onset  . Lung cancer Mother 31       2 different types of lung cancer; metastasis to brain; smoker  . Other Mother        hx of hysterectomy for unspecified reason  . Bone cancer Maternal Uncle        dx. early 43s  . Breast cancer Maternal Grandmother        dx. early 57s, s/p mastectomy  . Cancer Paternal Grandfather        unspecified type of cancer, dx. late 98s  . Congestive Heart Failure Father        smoker  . Stroke Father   . Eczema Father   . Cirrhosis Maternal Grandfather   . Heart Problems Maternal Grandfather   . Asthma Daughter   . Allergies Daughter        hives  . Lung cancer Other        (maternal great uncle; MGM's brother); had a coal stove  . Cancer Cousin        unspecified type; d. early age (paternal first cousin once-removed)  . Cancer Cousin        dx. as a kid; in remission today; (paternal 2nd cousin)  . Cancer Cousin        unspecified type; d. early 61s; (maternal 1st cousin)    Past Medical History:  Diagnosis Date  . Arthritis   . Asthma   . Back disorder    degenerative disk disease  . Breast cancer ([facility])   . Cancer ([place])   . Cardiomyopathy ([place])    a. Dx 02/2017 - EF 25-30%, felt possibly due to adriamycin +/- tachy mediated from atrial fib/flutter.  Marland Kitchen GERD (gastroesophageal reflux disease)   . Mammogram abnormal 08/24/15   first  . Migraine   . Normal coronary arteries    01/2016 cath  . PAF (paroxysmal atrial fibrillation) ([place])   . Pap smear for cervical cancer screening 08/12/15  . Paroxysmal atrial flutter ([facility])     Current Outpatient Prescriptions  Medication Sig Dispense Refill  . albuterol (PROVENTIL HFA;VENTOLIN HFA) 108 (90 Base) MCG/ACT inhaler Inhale 1-2 puffs into the lungs every 6 (six) hours as needed for wheezing or shortness of breath. 1 Inhaler 0  . albuterol (PROVENTIL) (2.5 MG/3ML) 0.083% nebulizer solution Take 2.5 mg by  nebulization every 6 (six) hours as needed for wheezing or shortness of breath.    Marland Kitchen amiodarone (PACERONE) 200 MG tablet Take 200-400 mg by mouth See admin instructions. Take 400 mg by mouth twice daily for 7 days, then take 200 mg by mouth twice daily for 14 days, then take 200 mg by mouth daily    . cholecalciferol (VITAMIN D) 1000 units tablet Take 1,000 Units by mouth daily.    Marland Kitchen dexamethasone (DECADRON) 4 MG tablet Take 1 tablet (4 mg total) by mouth daily. 30 tablet 0  . enoxaparin (LOVENOX) 100 MG/ML injection Inject 1 mL (100 mg total) into the skin every  12 (twelve) hours. 30 Syringe 2  . fentaNYL (DURAGESIC - DOSED MCG/HR) 12 MCG/HR Place 1 patch (12.5 mcg total) onto the skin every 3 (three) days. 5 patch 0  . ferrous sulfate 325 (65 FE) MG EC tablet Take 325 mg by mouth 3 (three) times daily with meals.    . furosemide (LASIX) 40 MG tablet Take 1 tablet (40 mg total) by mouth 2 (two) times daily. 30 tablet 1  . guaiFENesin-dextromethorphan (ROBITUSSIN DM) 100-10 MG/5ML syrup Take 5 mLs by mouth every 4 (four) hours as needed for cough. 118 mL 0  . losartan (COZAAR) 25 MG tablet Take 1 tablet (25 mg total) by mouth daily. 30 tablet 1  . Melatonin 10 MG TABS Take 10 mg by mouth at bedtime.     . metoprolol succinate (TOPROL-XL) 50 MG 24 hr tablet Take 1 tablet (50 mg total) by mouth daily. Take with or immediately following a meal. 30 tablet 0  . morphine (MSIR) 30 MG tablet Take 30 mg by mouth every 4 (four) hours as needed for severe pain.    Marland Kitchen ondansetron (ZOFRAN) 8 MG tablet Take 1 tablet (8 mg total) by mouth 2 (two) times daily as needed (Nausea or vomiting). 30 tablet 1  . potassium chloride SA (K-DUR,KLOR-CON) 20 MEQ tablet Take 1 tablet (20 mEq total) by mouth daily. 30 tablet 3  . senna (SENOKOT) 8.6 MG TABS tablet Take 1 tablet by mouth daily as needed for mild constipation.    Marland Kitchen tobramycin-dexamethasone (TOBRADEX) ophthalmic solution Place 2 drops into both eyes every 4 (four)  hours while awake. 5 mL 0   No current facility-administered medications for this encounter.    Facility-Administered Medications Ordered in Other Encounters  Medication Dose Route Frequency Provider Last Rate Last Dose  . 0.9 %  sodium chloride infusion   Intravenous Once Gentry Fitz F, NP        Vitals:   03/05/17 1531  BP: (!) 132/96  Pulse: 78  SpO2: 97%  Weight: 199 lb 6.4 oz (90.4 kg)   Filed Weights   03/05/17 1531  Weight: 199 lb 6.4 oz (90.4 kg)     PHYSICAL EXAM: General:  Fatigued appearing female, arrived in wheelchair. NAD  HEENT: normal Neck: supple. JVP flat  Carotids 2+ bilaterally; no bruits. No lymphadenopathy or thryomegaly appreciated. Cor: PMI normal. Regular rate and rhythm. No S3.  Lungs: Clear bilaterally, normal effort.  Abdomen: obese soft, nontender, nondistended. No hepatosplenomegaly. Bowel sounds present Extremities: no cyanosis, clubbing, rash. No peripheral edema, dry skin.  Neuro: alert & orientedx3, cranial nerves grossly intact. Moves all 4 extremities w/o difficulty. Affect flat.  ECG: NSR, rate 65.    ASSESSMENT & PLAN:  1.Chronic systolic CHF: NICM, tachy medicated (AF) vs. Adriamycin. EF 25-30%.  - NYHA III-III - Appears dry on exam. Reduce lasix to 40mg  every other day.  - BMET today.  - Continue metoprolol XL 50 mg daily.  - Increase losartan to 50 mg daily. Can consider switching to Entresto at next visit if she tolerates.   2. PAF - NSR today by EKG.  - Continue amiodarone 200 mg BID.  - This patients CHA2DS2-VASc Score and unadjusted Ischemic Stroke Rate (% per year) is equal to 3.2 % stroke rate/year from a score of 3 Above score calculated as 1 point each if present [CHF, HTN, DM, Vascular=MI/PAD/Aortic Plaque, Age if 65-74, or Female], 2 points each if present [Age > 75, or Stroke/TIA/TE] - Continue Xarelto for anticoagulation  3. Left Breast Cancer with mets to lungs - Sees Dr. Lindi Adie.   Follow up in 2 months  with Dr. Peri Maris NP-C  3:42 PM  Patient seen and examined with Jettie Booze, NP. We discussed all aspects of the encounter. I agree with the assessment and plan as stated above.   She is much improved today. Back in NSR. Volume status much improved (likely dry). Will continue amio 200 bid. Decrease lasix to 40 mg qod. Can take lasix on off days as needed for volume overload. Continue Xarelto. BMET drawn today - renal function and electrolytes are ok.   I suspect cardiomyopathy is tachy-mediated (AF) and hopefully EF will improve with maintenance of NSR. Will repeat echo at next visit.   Glori Bickers, MD  8:56 PM

## 2017-03-07 ENCOUNTER — Ambulatory Visit (HOSPITAL_BASED_OUTPATIENT_CLINIC_OR_DEPARTMENT_OTHER): Payer: Federal, State, Local not specified - PPO

## 2017-03-07 ENCOUNTER — Encounter: Payer: Self-pay | Admitting: Hematology and Oncology

## 2017-03-07 ENCOUNTER — Ambulatory Visit (HOSPITAL_BASED_OUTPATIENT_CLINIC_OR_DEPARTMENT_OTHER): Payer: Federal, State, Local not specified - PPO | Admitting: Hematology and Oncology

## 2017-03-07 ENCOUNTER — Other Ambulatory Visit (HOSPITAL_BASED_OUTPATIENT_CLINIC_OR_DEPARTMENT_OTHER): Payer: Federal, State, Local not specified - PPO

## 2017-03-07 DIAGNOSIS — C7801 Secondary malignant neoplasm of right lung: Secondary | ICD-10-CM

## 2017-03-07 DIAGNOSIS — C7951 Secondary malignant neoplasm of bone: Secondary | ICD-10-CM | POA: Diagnosis not present

## 2017-03-07 DIAGNOSIS — C771 Secondary and unspecified malignant neoplasm of intrathoracic lymph nodes: Secondary | ICD-10-CM | POA: Diagnosis not present

## 2017-03-07 DIAGNOSIS — C50412 Malignant neoplasm of upper-outer quadrant of left female breast: Secondary | ICD-10-CM

## 2017-03-07 DIAGNOSIS — Z171 Estrogen receptor negative status [ER-]: Secondary | ICD-10-CM | POA: Diagnosis not present

## 2017-03-07 DIAGNOSIS — I509 Heart failure, unspecified: Secondary | ICD-10-CM

## 2017-03-07 DIAGNOSIS — R0602 Shortness of breath: Secondary | ICD-10-CM | POA: Diagnosis not present

## 2017-03-07 DIAGNOSIS — I4891 Unspecified atrial fibrillation: Secondary | ICD-10-CM | POA: Diagnosis not present

## 2017-03-07 DIAGNOSIS — C7802 Secondary malignant neoplasm of left lung: Secondary | ICD-10-CM

## 2017-03-07 LAB — COMPREHENSIVE METABOLIC PANEL
ALT: 67 U/L — ABNORMAL HIGH (ref 0–55)
ANION GAP: 9 meq/L (ref 3–11)
AST: 36 U/L — ABNORMAL HIGH (ref 5–34)
Albumin: 2.4 g/dL — ABNORMAL LOW (ref 3.5–5.0)
Alkaline Phosphatase: 112 U/L (ref 40–150)
BILIRUBIN TOTAL: 0.28 mg/dL (ref 0.20–1.20)
BUN: 23.6 mg/dL (ref 7.0–26.0)
CO2: 24 meq/L (ref 22–29)
Calcium: 10.2 mg/dL (ref 8.4–10.4)
Chloride: 103 mEq/L (ref 98–109)
Creatinine: 0.7 mg/dL (ref 0.6–1.1)
Glucose: 125 mg/dl (ref 70–140)
POTASSIUM: 3.9 meq/L (ref 3.5–5.1)
Sodium: 136 mEq/L (ref 136–145)
TOTAL PROTEIN: 6.5 g/dL (ref 6.4–8.3)

## 2017-03-07 LAB — CBC WITH DIFFERENTIAL/PLATELET
BASO%: 0.6 % (ref 0.0–2.0)
BASOS ABS: 0 10*3/uL (ref 0.0–0.1)
EOS ABS: 0 10*3/uL (ref 0.0–0.5)
EOS%: 0.2 % (ref 0.0–7.0)
HCT: 29.8 % — ABNORMAL LOW (ref 34.8–46.6)
HGB: 9.7 g/dL — ABNORMAL LOW (ref 11.6–15.9)
LYMPH%: 36.9 % (ref 14.0–49.7)
MCH: 28.7 pg (ref 25.1–34.0)
MCHC: 32.6 g/dL (ref 31.5–36.0)
MCV: 87.9 fL (ref 79.5–101.0)
MONO#: 0.3 10*3/uL (ref 0.1–0.9)
MONO%: 8.3 % (ref 0.0–14.0)
NEUT%: 54 % (ref 38.4–76.8)
NEUTROS ABS: 1.9 10*3/uL (ref 1.5–6.5)
PLATELETS: 376 10*3/uL (ref 145–400)
RBC: 3.4 10*6/uL — AB (ref 3.70–5.45)
RDW: 20.9 % — ABNORMAL HIGH (ref 11.2–14.5)
WBC: 3.5 10*3/uL — ABNORMAL LOW (ref 3.9–10.3)
lymph#: 1.3 10*3/uL (ref 0.9–3.3)

## 2017-03-07 MED ORDER — SODIUM CHLORIDE 0.9% FLUSH
10.0000 mL | INTRAVENOUS | Status: DC | PRN
  Administered 2017-03-07: 10 mL
  Filled 2017-03-07: qty 10

## 2017-03-07 MED ORDER — SODIUM CHLORIDE 0.9 % IV SOLN
1.4200 mg/m2 | Freq: Once | INTRAVENOUS | Status: AC
  Administered 2017-03-07: 3 mg via INTRAVENOUS
  Filled 2017-03-07: qty 6

## 2017-03-07 MED ORDER — HEPARIN SOD (PORK) LOCK FLUSH 100 UNIT/ML IV SOLN
250.0000 [IU] | Freq: Once | INTRAVENOUS | Status: AC | PRN
  Administered 2017-03-07: 250 [IU]
  Filled 2017-03-07: qty 5

## 2017-03-07 MED ORDER — PROCHLORPERAZINE MALEATE 10 MG PO TABS
10.0000 mg | ORAL_TABLET | Freq: Once | ORAL | Status: AC
Start: 2017-03-07 — End: 2017-03-07
  Administered 2017-03-07: 10 mg via ORAL

## 2017-03-07 MED ORDER — MORPHINE SULFATE 30 MG PO TABS: 30.0000 mg | ORAL_TABLET | ORAL | 0 refills | Status: DC | PRN

## 2017-03-07 MED ORDER — PROCHLORPERAZINE MALEATE 10 MG PO TABS
ORAL_TABLET | ORAL | Status: AC
  Filled 2017-03-07: qty 1

## 2017-03-07 MED ORDER — DEXAMETHASONE 4 MG PO TABS: 2.0000 mg | ORAL_TABLET | Freq: Every day | ORAL | 3 refills | Status: DC

## 2017-03-07 MED ORDER — POTASSIUM CHLORIDE CRYS ER 20 MEQ PO TBCR: 20.0000 meq | EXTENDED_RELEASE_TABLET | Freq: Every day | ORAL | 3 refills | Status: DC

## 2017-03-07 MED ORDER — ONDANSETRON HCL 8 MG PO TABS: 8.0000 mg | ORAL_TABLET | Freq: Two times a day (BID) | ORAL | 1 refills | Status: DC | PRN

## 2017-03-07 MED ORDER — SODIUM CHLORIDE 0.9 % IV SOLN
Freq: Once | INTRAVENOUS | Status: AC
  Administered 2017-03-07: 11:00:00 via INTRAVENOUS

## 2017-03-07 MED ORDER — METOPROLOL SUCCINATE ER 50 MG PO TB24: 50.0000 mg | ORAL_TABLET | Freq: Every day | ORAL | 6 refills | Status: DC

## 2017-03-07 MED ORDER — AMIODARONE HCL 200 MG PO TABS: 200.0000 mg | ORAL_TABLET | Freq: Two times a day (BID) | ORAL | 5 refills | Status: DC

## 2017-03-07 MED ORDER — FENTANYL 12 MCG/HR TD PT72: 12.5000 ug | MEDICATED_PATCH | TRANSDERMAL | 0 refills | Status: DC

## 2017-03-07 NOTE — Patient Instructions (Signed)

## 2017-03-07 NOTE — Assessment & Plan Note (Signed)
Left lumpectomy 02/13/2016: IDC grade 3, 3.3 cm, margins negative, 0/3 lymph nodes negative, triple negative, T2 N0 stage II a pathologic stage  Left breast biopsy 08/17/2015:1:30 position: Invasive ductal carcinoma, grade 3, ER 0%, PR 0%, HER-2 negative ratio 1.48, Ki-67 90%, 3.1 cm tumor, axilla negative, T2 N0 stage II a clinical stage Breast biopsies x 2: 09/28/2015 : [place]  Treatment summary: 1. Completed Cycle 4 dose dense Adriamycin and Cytoxan on 10/31/15; completed cycle 3 Taxol and carboplatin, completed 7 cycles of Taxol and treatment discontinued for poor tolerance. 2. left lumpectomy 02/13/2016  3. Adjuvant radiation 03/26/2016 to09/07/2016 Patient was offered treatment with Xeloda as well as immunotherapy clinical trial. She refused both of those options. ----------------------------------------------------------------------------------------------------------------------------------------------------- Relapse: CT chest 11/13/2016 revealed multiple lung nodules and mediastinal lymph nodes 11/20/2016: Lung biopsy: Metastatic breast cancer triple negative PET/CT scan 12/04/2016: Multiple bilateral lung nodules or masses, 12 mm LUL, to 3 cm masses RML, 2.3 cm posterior left lung base, extensive thoracic lymphadenopathy 2.9 cm prevascular, 2.2 cm subcarinal, 2 cm left hilar, 1.5 cm right infrahilar, 1.5 cm right IMA , additional bone metastases right acetabulum CT chest 01/30/2017 demonstrates worsened tumor burden with significant abnormal nodal enlargement in chest, new and enlarged pulmonary nodules and masses, new and enlarged right breast and subcutaneous masses and nodules, trace bilateral pleural effusions  Xeloda from April to June, stopped due to progression  ----------------------------------------------------------------------------------------------------------------------------------------------------- Current treatment: Cycle 1 day 8 Halaven Halaven  toxicities:  Shortness of breath Atrial fibrillation CHF Being monitored by cardiology Return to clinic in 2 weeks for cycle 2

## 2017-03-07 NOTE — Progress Notes (Signed)
Patient Care Team: [name], MD as PCP - General (Internal Medicine) Nicholas Lose, MD as Consulting Physician (Hematology and Oncology)  DIAGNOSIS:  Encounter Diagnoses  Name Primary?  . Malignant neoplasm of upper-outer quadrant of left breast in female, estrogen receptor negative ([place])   . Shortness of breath     SUMMARY OF ONCOLOGIC HISTORY:   Breast cancer of upper-outer quadrant of left female breast ([place])   08/17/2015 Initial Diagnosis    left breast biopsy 1:30 position: Invasive ductal carcinoma, grade 3, ER 0%, PR 0%, HER-2 negative ratio 1.48, Ki-67 90%, 3.1 cm tumor, axilla negative, T2 N0 stage II a clinical stage      09/19/2015 - 01/03/2016 Neo-Adjuvant Chemotherapy    Neoadjuvant chemotherapy with dose dense Adriamycin and Cytoxan 4 followed by Carbo/Taxol x 3 cycles (Carbo d/c'd after cycle #3 d/t hospitalization for N&V/dehydration). Went on to complete additional 3 cycles of Taxol alone.       09/28/2015 Procedure     Left breast biopsy anterior third left upper outer quadrant: [facility];  upper inner quadrant: [place]      10/06/2015 Procedure    Genetic testing: Negative. Genes analyzed: ATM, BARD1, BRCA1, BRCA2, BRIP1, CDH1, CHEK2, FANCC, MLH1, MSH2, MSH6, NBN, PALB2, PMS2, PTEN, RAD51C, RAD51D, TP53, and XRCC2.  This panel also includes deletion/duplication analysis (without sequencing) for one gene, EPCAM      01/16/2016 Breast MRI    significant response to chemotherapy with decrease in the tumor from 5.1 cm to 2.9 cm, also decrease a non-mass enhancement      02/13/2016 Surgery    Left lumpectomy ([facility]): IDC grade 3, 3.3 cm, margins negative, 0/3 lymph nodes negative, triple negative, T2 N0 stage II a pathologic stage      02/21/2016 Surgery    Bilateral breast mammoplasty ([place]); no malignancy on path      03/26/2016 - 05/10/2016 Radiation Therapy    Adjuvant radiation therapy Lisbeth Renshaw). Left breast: 50.4 Gy in 28 fractions. Left breast  boost: 10 Gy in 5 fractions.       11/13/2016 Imaging    CT chest: Extensive adenopathy throughout anterior mediastinum largest 2.6 cm,Ln at aortic arch 2.8 cm, subcarinal LN 2.8 cm, left hilar LN 1.6 cm, right hilar LN 1.8 cm, lung nodules bilaterally largest right lung 2.6 cm      11/20/2016 Relapse/Recurrence    Lung biopsy left lower quadrant: Metastatic carcinoma consistent with breast      12/04/2016 PET scan    Multiple bilateral lung nodules or masses, 12 mm LUL, to 3 cm masses RML, 2.3 cm posterior left lung base, extensive thoracic lymphadenopathy 2.9 cm prevascular, 2.2 cm subcarinal, 2 cm left hilar, 1.5 cm right infrahilar, 1.5 cm right IMA , additional bone metastases right acetabulum      12/20/2016 - 01/28/2017 Chemotherapy    Xeloda 1500 mg by mouth twice a day 2 weeks on one week off       02/28/2017 -  Chemotherapy    Halaven palliative chemotherapy         CHIEF COMPLIANT: Cycle 1 day 8 Halaven   INTERVAL HISTORY: Cindy Bradshaw is a 46 year old with above-mentioned history metastatic breast cancer who is currently on palliative chemotherapy with Halaven. She tolerated cycle 1 day 1 of Halaven extremely well. She did not have any nausea vomiting. She is here to receive cycle 1 day 8 of treatment. Patient has multiple comorbidities including congestive heart failure and chronic respiratory symptoms. She was in the  hospital recently for many of these problems.  REVIEW OF SYSTEMS:   Constitutional: Denies fevers, chills or abnormal weight loss Eyes: Denies blurriness of vision Ears, nose, mouth, throat, and face: Denies mucositis or sore throat Respiratory: Cough and shortness of breath Cardiovascular: Denies palpitation, chest discomfort Gastrointestinal:  Denies nausea, heartburn or change in bowel habits Skin: Denies abnormal skin rashes Lymphatics: Denies new lymphadenopathy or easy bruising Neurological:Denies numbness, tingling or new  weaknesses Behavioral/Psych: Mood is stable, no new changes  Extremities: No lower extremity edema Breast:  denies any pain or lumps or nodules in either breasts All other systems were reviewed with the patient and are negative.  I have reviewed the past medical history, past surgical history, social history and family history with the patient and they are unchanged from previous note.  ALLERGIES:  is allergic to dilaudid [hydromorphone hcl]; ivp dye [iodinated diagnostic agents]; percocet [oxycodone-acetaminophen]; tape; and toradol [ketorolac tromethamine].  MEDICATIONS:  Current Outpatient Prescriptions  Medication Sig Dispense Refill  . albuterol (PROVENTIL HFA;VENTOLIN HFA) 108 (90 Base) MCG/ACT inhaler Inhale 1-2 puffs into the lungs every 6 (six) hours as needed for wheezing or shortness of breath. 1 Inhaler 0  . albuterol (PROVENTIL) (2.5 MG/3ML) 0.083% nebulizer solution Take 2.5 mg by nebulization every 6 (six) hours as needed for wheezing or shortness of breath.    Marland Kitchen amiodarone (PACERONE) 200 MG tablet Take 1 tablet (200 mg total) by mouth 2 (two) times daily. Take 400 mg by mouth twice daily for 7 days, then take 200 mg by mouth twice daily for 14 days, then take 200 mg by mouth daily 60 tablet 5  . cholecalciferol (VITAMIN D) 1000 units tablet Take 1,000 Units by mouth daily.    Marland Kitchen dexamethasone (DECADRON) 4 MG tablet Take 0.5 tablets (2 mg total) by mouth daily. 30 tablet 3  . enoxaparin (LOVENOX) 100 MG/ML injection Inject 1 mL (100 mg total) into the skin every 12 (twelve) hours. 30 Syringe 2  . fentaNYL (DURAGESIC - DOSED MCG/HR) 12 MCG/HR Place 1 patch (12.5 mcg total) onto the skin every 3 (three) days. 10 patch 0  . ferrous sulfate 325 (65 FE) MG EC tablet Take 325 mg by mouth 3 (three) times daily with meals.    . furosemide (LASIX) 40 MG tablet Take 1 tablet (40 mg total) by mouth every other day. 15 tablet 3  . guaiFENesin-dextromethorphan (ROBITUSSIN DM) 100-10 MG/5ML  syrup Take 5 mLs by mouth every 4 (four) hours as needed for cough. 118 mL 0  . losartan (COZAAR) 50 MG tablet Take 1 tablet (50 mg total) by mouth daily. 30 tablet 3  . Melatonin 10 MG TABS Take 10 mg by mouth at bedtime.     . metoprolol succinate (TOPROL-XL) 50 MG 24 hr tablet Take 1 tablet (50 mg total) by mouth daily. Take with or immediately following a meal. 30 tablet 6  . morphine (MSIR) 30 MG tablet Take 1 tablet (30 mg total) by mouth every 4 (four) hours as needed for severe pain. 60 tablet 0  . ondansetron (ZOFRAN) 8 MG tablet Take 1 tablet (8 mg total) by mouth 2 (two) times daily as needed (Nausea or vomiting). 30 tablet 1  . potassium chloride SA (K-DUR,KLOR-CON) 20 MEQ tablet Take 1 tablet (20 mEq total) by mouth daily. 30 tablet 3  . senna (SENOKOT) 8.6 MG TABS tablet Take 1 tablet by mouth daily as needed for mild constipation.    Marland Kitchen tobramycin-dexamethasone (TOBRADEX) ophthalmic solution Place  2 drops into both eyes every 4 (four) hours while awake. 5 mL 0   No current facility-administered medications for this visit.    Facility-Administered Medications Ordered in Other Visits  Medication Dose Route Frequency Provider Last Rate Last Dose  . 0.9 %  sodium chloride infusion   Intravenous Once Boelter, Nira Conn F, NP      . eriBULin mesylate (HALAVEN) 3 mg in sodium chloride 0.9 % 100 mL chemo infusion  1.42 mg/m2 (Treatment Plan Recorded) Intravenous Once Nicholas Lose, MD      . heparin lock flush 100 unit/mL  250 Units Intracatheter Once PRN Nicholas Lose, MD      . sodium chloride flush (NS) 0.9 % injection 10 mL  10 mL Intracatheter PRN Nicholas Lose, MD        PHYSICAL EXAMINATION: ECOG PERFORMANCE STATUS: 2 - Symptomatic, <50% confined to bed  Vitals:   03/07/17 1035  BP: 139/76  Pulse: 79  Resp: 18  Temp: 98.1 F (36.7 C)   Filed Weights   03/07/17 1035  Weight: 202 lb 9.6 oz (91.9 kg)    GENERAL:alert, no distress and comfortable SKIN: skin color, texture,  turgor are normal, no rashes or significant lesions EYES: normal, Conjunctiva are pink and non-injected, sclera clear OROPHARYNX:no exudate, no erythema and lips, buccal mucosa, and tongue normal  NECK: supple, thyroid normal size, non-tender, without nodularity LYMPH:  no palpable lymphadenopathy in the cervical, axillary or inguinal LUNGS: Bilateral wheezes and fine crackles in the bases of the lung HEART: Atrial fibrillation ABDOMEN:abdomen soft, non-tender and normal bowel sounds MUSCULOSKELETAL:no cyanosis of digits and no clubbing  NEURO: alert & oriented x 3 with fluent speech, no focal motor/sensory deficits EXTREMITIES: No lower extremity edema  LABORATORY DATA:  I have reviewed the data as listed   Chemistry      Component Value Date/Time   NA 136 03/07/2017 1032   K 3.9 03/07/2017 1032   CL 101 03/05/2017 1554   CO2 24 03/07/2017 1032   BUN 23.6 03/07/2017 1032   CREATININE 0.7 03/07/2017 1032      Component Value Date/Time   CALCIUM 10.2 03/07/2017 1032   ALKPHOS 112 03/07/2017 1032   AST 36 (H) 03/07/2017 1032   ALT 67 (H) 03/07/2017 1032   BILITOT 0.28 03/07/2017 1032       Lab Results  Component Value Date   WBC 3.5 (L) 03/07/2017   HGB 9.7 (L) 03/07/2017   HCT 29.8 (L) 03/07/2017   MCV 87.9 03/07/2017   PLT 376 03/07/2017   NEUTROABS 1.9 03/07/2017    ASSESSMENT & PLAN:  Breast cancer of upper-outer quadrant of left female breast ([place]) Left lumpectomy 02/13/2016: IDC grade 3, 3.3 cm, margins negative, 0/3 lymph nodes negative, triple negative, T2 N0 stage II a pathologic stage  Left breast biopsy 08/17/2015:1:30 position: Invasive ductal carcinoma, grade 3, ER 0%, PR 0%, HER-2 negative ratio 1.48, Ki-67 90%, 3.1 cm tumor, axilla negative, T2 N0 stage II a clinical stage Breast biopsies x 2: 09/28/2015 : [place]  Treatment summary: 1. Completed Cycle 4 dose dense Adriamycin and Cytoxan on 10/31/15; completed cycle 3 Taxol and carboplatin, completed 7  cycles of Taxol and treatment discontinued for poor tolerance. 2. left lumpectomy 02/13/2016  3. Adjuvant radiation 03/26/2016 to09/07/2016 Patient was offered treatment with Xeloda as well as immunotherapy clinical trial. She refused both of those options. ----------------------------------------------------------------------------------------------------------------------------------------------------- Relapse: CT chest 11/13/2016 revealed multiple lung nodules and mediastinal lymph nodes 11/20/2016: Lung biopsy: Metastatic breast cancer triple  negative PET/CT scan 12/04/2016: Multiple bilateral lung nodules or masses, 12 mm LUL, to 3 cm masses RML, 2.3 cm posterior left lung base, extensive thoracic lymphadenopathy 2.9 cm prevascular, 2.2 cm subcarinal, 2 cm left hilar, 1.5 cm right infrahilar, 1.5 cm right IMA , additional bone metastases right acetabulum CT chest 01/30/2017 demonstrates worsened tumor burden with significant abnormal nodal enlargement in chest, new and enlarged pulmonary nodules and masses, new and enlarged right breast and subcutaneous masses and nodules, trace bilateral pleural effusions  Xeloda from April to June, stopped due to progression  ----------------------------------------------------------------------------------------------------------------------------------------------------- Current treatment: Cycle 1 day 8 Halaven Halaven toxicities: Tolerating it very well denies any nausea vomiting.  Shortness of breath: Patient appears to be breathing significantly better. I would now like to request a port placement and removal of PICC line. Atrial fibrillation CHF Being monitored by cardiology Return to clinic in 2 weeks for cycle 2   I spent 25 minutes talking to the patient of which more than half was spent in counseling and coordination of care.  No orders of the defined types were placed in this encounter.  The patient has a good understanding of the  overall plan. she agrees with it. she will call with any problems that may develop before the next visit here.   Rulon Eisenmenger, MD 03/07/17

## 2017-03-07 NOTE — Patient Instructions (Signed)
[facility] Discharge Instructions for Patients Receiving Chemotherapy  Today you received the following chemotherapy agents Halaven.  To help prevent nausea and vomiting after your treatment, we encourage you to take your nausea medication as prescribed.   If you develop nausea and vomiting that is not controlled by your nausea medication, call the clinic.   BELOW ARE SYMPTOMS THAT SHOULD BE REPORTED IMMEDIATELY:  *FEVER GREATER THAN 100.5 F  *CHILLS WITH OR WITHOUT FEVER  NAUSEA AND VOMITING THAT IS NOT CONTROLLED WITH YOUR NAUSEA MEDICATION  *UNUSUAL SHORTNESS OF BREATH  *UNUSUAL BRUISING OR BLEEDING  TENDERNESS IN MOUTH AND THROAT WITH OR WITHOUT PRESENCE OF ULCERS  *URINARY PROBLEMS  *BOWEL PROBLEMS  UNUSUAL RASH Items with * indicate a potential emergency and should be followed up as soon as possible.  Feel free to call the clinic you have any questions or concerns. The clinic phone number is [phone].  Please show the CHEMO ALERT CARD at check-in to the Emergency Department and triage nurse.   

## 2017-03-08 ENCOUNTER — Other Ambulatory Visit: Payer: Self-pay

## 2017-03-08 ENCOUNTER — Telehealth: Payer: Self-pay

## 2017-03-08 DIAGNOSIS — C50919 Malignant neoplasm of unspecified site of unspecified female breast: Secondary | ICD-10-CM

## 2017-03-08 DIAGNOSIS — Z171 Estrogen receptor negative status [ER-]: Secondary | ICD-10-CM

## 2017-03-08 DIAGNOSIS — C50412 Malignant neoplasm of upper-outer quadrant of left female breast: Secondary | ICD-10-CM

## 2017-03-08 NOTE — Telephone Encounter (Signed)
Called and spoke with pt regarding her upcoming IR port placement schedule at [facility] on 03/19/17 at 1230pm. Pt to arrive and register at 830am. NPO after midnight. Pt to hold Lovenox shot 24 hrs before scheduled port placement. Will notify [name]. Pt verbally confirmed time/date and ware of instructions.

## 2017-03-11 ENCOUNTER — Other Ambulatory Visit: Payer: Federal, State, Local not specified - PPO

## 2017-03-11 ENCOUNTER — Ambulatory Visit: Payer: Federal, State, Local not specified - PPO

## 2017-03-14 ENCOUNTER — Ambulatory Visit: Payer: Federal, State, Local not specified - PPO

## 2017-03-14 ENCOUNTER — Ambulatory Visit: Payer: Federal, State, Local not specified - PPO | Admitting: Adult Health

## 2017-03-14 ENCOUNTER — Other Ambulatory Visit: Payer: Federal, State, Local not specified - PPO

## 2017-03-18 ENCOUNTER — Other Ambulatory Visit: Payer: Federal, State, Local not specified - PPO

## 2017-03-18 ENCOUNTER — Ambulatory Visit: Payer: Federal, State, Local not specified - PPO

## 2017-03-18 ENCOUNTER — Other Ambulatory Visit: Payer: Self-pay | Admitting: Radiology

## 2017-03-19 ENCOUNTER — Other Ambulatory Visit: Payer: Self-pay | Admitting: Hematology and Oncology

## 2017-03-19 ENCOUNTER — Ambulatory Visit (HOSPITAL_COMMUNITY)
Admission: RE | Admit: 2017-03-19 | Discharge: 2017-03-19 | Disposition: A | Discharge status: Home / Self Care | Payer: Federal, State, Local not specified - PPO | Source: Ambulatory Visit | Attending: Hematology and Oncology | Admitting: Hematology and Oncology

## 2017-03-19 ENCOUNTER — Encounter (HOSPITAL_COMMUNITY): Payer: Self-pay

## 2017-03-19 DIAGNOSIS — C50412 Malignant neoplasm of upper-outer quadrant of left female breast: Secondary | ICD-10-CM

## 2017-03-19 DIAGNOSIS — C50919 Malignant neoplasm of unspecified site of unspecified female breast: Secondary | ICD-10-CM

## 2017-03-19 DIAGNOSIS — I480 Paroxysmal atrial fibrillation: Secondary | ICD-10-CM | POA: Insufficient documentation

## 2017-03-19 DIAGNOSIS — Z171 Estrogen receptor negative status [ER-]: Secondary | ICD-10-CM

## 2017-03-19 DIAGNOSIS — K219 Gastro-esophageal reflux disease without esophagitis: Secondary | ICD-10-CM | POA: Diagnosis not present

## 2017-03-19 DIAGNOSIS — I429 Cardiomyopathy, unspecified: Secondary | ICD-10-CM | POA: Diagnosis not present

## 2017-03-19 DIAGNOSIS — Z803 Family history of malignant neoplasm of breast: Secondary | ICD-10-CM | POA: Insufficient documentation

## 2017-03-19 DIAGNOSIS — I4892 Unspecified atrial flutter: Secondary | ICD-10-CM | POA: Insufficient documentation

## 2017-03-19 DIAGNOSIS — J45909 Unspecified asthma, uncomplicated: Secondary | ICD-10-CM | POA: Insufficient documentation

## 2017-03-19 DIAGNOSIS — M1990 Unspecified osteoarthritis, unspecified site: Secondary | ICD-10-CM | POA: Diagnosis not present

## 2017-03-19 HISTORY — PX: IR US GUIDE VASC ACCESS RIGHT: IMG2390

## 2017-03-19 HISTORY — PX: IR FLUORO GUIDE PORT INSERTION RIGHT: IMG5741

## 2017-03-19 LAB — CBC
HCT: 29.2 % — ABNORMAL LOW (ref 36.0–46.0)
HEMOGLOBIN: 9.3 g/dL — AB (ref 12.0–15.0)
MCH: 28.2 pg (ref 26.0–34.0)
MCHC: 31.8 g/dL (ref 30.0–36.0)
MCV: 88.5 fL (ref 78.0–100.0)
Platelets: 354 10*3/uL (ref 150–400)
RBC: 3.3 MIL/uL — AB (ref 3.87–5.11)
RDW: 21.6 % — ABNORMAL HIGH (ref 11.5–15.5)
WBC: 4.5 10*3/uL (ref 4.0–10.5)

## 2017-03-19 LAB — BASIC METABOLIC PANEL
ANION GAP: 8 (ref 5–15)
BUN: 18 mg/dL (ref 6–20)
CALCIUM: 9 mg/dL (ref 8.9–10.3)
CHLORIDE: 105 mmol/L (ref 101–111)
CO2: 26 mmol/L (ref 22–32)
Creatinine, Ser: 0.7 mg/dL (ref 0.44–1.00)
GFR calc non Af Amer: >60 mL/min (ref >60)
Glucose, Bld: 85 mg/dL (ref 65–99)
Potassium: 3.4 mmol/L — ABNORMAL LOW (ref 3.5–5.1)
Sodium: 139 mmol/L (ref 135–145)

## 2017-03-19 LAB — PROTIME-INR
INR: 0.9
PROTHROMBIN TIME: 12.2 s (ref 11.4–15.2)

## 2017-03-19 LAB — APTT: aPTT: 26 seconds (ref 24–36)

## 2017-03-19 MED ORDER — LIDOCAINE HCL 1 % IJ SOLN
INTRAMUSCULAR | Status: AC
  Filled 2017-03-19: qty 20

## 2017-03-19 MED ORDER — MIDAZOLAM HCL 2 MG/2ML IJ SOLN
INTRAMUSCULAR | Status: AC | PRN
  Administered 2017-03-19 (×2): 1 mg via INTRAVENOUS

## 2017-03-19 MED ORDER — LIDOCAINE HCL 1 % IJ SOLN
INTRAMUSCULAR | Status: AC | PRN
  Administered 2017-03-19: 10 mL via INTRADERMAL

## 2017-03-19 MED ORDER — HEPARIN SOD (PORK) LOCK FLUSH 100 UNIT/ML IV SOLN
INTRAVENOUS | Status: AC | PRN
  Administered 2017-03-19: 500 [IU] via INTRAVENOUS

## 2017-03-19 MED ORDER — SODIUM CHLORIDE 0.9 % IV SOLN
INTRAVENOUS | Status: DC
  Administered 2017-03-19: 14:00:00 via INTRAVENOUS

## 2017-03-19 MED ORDER — MIDAZOLAM HCL 2 MG/2ML IJ SOLN
INTRAMUSCULAR | Status: AC
  Filled 2017-03-19: qty 4

## 2017-03-19 MED ORDER — FENTANYL CITRATE (PF) 100 MCG/2ML IJ SOLN
INTRAMUSCULAR | Status: AC | PRN
  Administered 2017-03-19 (×2): 50 ug via INTRAVENOUS

## 2017-03-19 MED ORDER — CEFAZOLIN SODIUM-DEXTROSE 2-4 GM/100ML-% IV SOLN
2.0000 g | INTRAVENOUS | Status: AC
  Administered 2017-03-19: 2 g via INTRAVENOUS

## 2017-03-19 MED ORDER — HEPARIN SOD (PORK) LOCK FLUSH 100 UNIT/ML IV SOLN
INTRAVENOUS | Status: AC
  Filled 2017-03-19: qty 5

## 2017-03-19 MED ORDER — CEFAZOLIN SODIUM-DEXTROSE 2-4 GM/100ML-% IV SOLN
INTRAVENOUS | Status: AC
  Administered 2017-03-19: 2 g via INTRAVENOUS
  Filled 2017-03-19: qty 100

## 2017-03-19 MED ORDER — FENTANYL CITRATE (PF) 100 MCG/2ML IJ SOLN
INTRAMUSCULAR | Status: AC
  Filled 2017-03-19: qty 4

## 2017-03-19 NOTE — Procedures (Signed)
RIJV PAC RUE PICC REMOVAL SVC RA EBL 0 Comp 0

## 2017-03-19 NOTE — H&P (Signed)
Chief Complaint: Patient was seen in consultation today for metastatic breast cancer  Referring Physician(s): Gudena,Vinay  Supervising Physician: Marybelle Killings  Patient Status: [name]  History of Present Illness: Cindy Bradshaw is a 46 y.o. female with history of metastatic breast cancer s/p surgery and chemoradiation.  She presented to radiology department for [facility] placement last month, however was found to have new onset a fib.  A PICC was placed and patient discharged for ongoing evaluation of her new heart rhythm.  IR consulted for PICC removal and Port-A-Cath placement at the request of Dr. Lindi Adie.   She has been NPO.  She does take blood thinner for a fib, but has held her Lovenox appropriately for the last 2 days.   She presents for procedure today in her usual state of health.   Past Medical History:  Diagnosis Date  . Arthritis   . Asthma   . Back disorder    degenerative disk disease  . Breast cancer ([place])   . Cancer ([place])   . Cardiomyopathy ([place])    a. Dx 02/2017 - EF 25-30%, felt possibly due to adriamycin +/- tachy mediated from atrial fib/flutter.  Marland Kitchen GERD (gastroesophageal reflux disease)   . Mammogram abnormal 08/24/15   first  . Migraine   . Normal coronary arteries    01/2016 cath  . PAF (paroxysmal atrial fibrillation) ([place])   . Pap smear for cervical cancer screening 08/12/15  . Paroxysmal atrial flutter [facility])     Past Surgical History:  Procedure Laterality Date  . BREAST LUMPECTOMY WITH RADIOACTIVE SEED AND SENTINEL LYMPH NODE BIOPSY Left 02/13/2016   Procedure: BREAST LUMPECTOMY WITH RADIOACTIVE SEED AND SENTINEL LYMPH NODE BIOPSY;  Surgeon: Excell Seltzer, MD;  Location: [place];  Service: General;  Laterality: Left;  . BREAST REDUCTION SURGERY Bilateral 02/21/2016   Procedure: MAMMARY REDUCTION  (BREAST)/Oncoplastic breast reconstruction;  Surgeon: Irene Limbo, MD;  Location: [place];  Service:  Plastics;  Laterality: Bilateral;  . CARDIAC CATHETERIZATION [name] 01/24/2016   Procedure: Left Heart Cath and Coronary Angiography;  Surgeon: Leonie Man, MD;  Location: [place] CV [facility];  Service: Cardiovascular;  Laterality: [name];  . CESAREAN SECTION    . CHOLECYSTECTOMY    . IR FLUORO GUIDE CV LINE RIGHT  02/11/2017  . IR US GUIDE VASC ACCESS RIGHT  02/11/2017  . PERIPHERAL VASCULAR CATHETERIZATION Right 02/13/2016   Procedure: PORTA CATH REMOVAL;  Surgeon: Excell Seltzer, MD;  Location: [place];  Service: General;  Laterality: Right;  . PORTACATH PLACEMENT [name] 09/12/2015   Procedure: INSERTION PORT-A-CATH;  Surgeon: Excell Seltzer, MD;  Location: [facility];  Service: General;  Laterality: [name];    Allergies: Dilaudid [hydromorphone hcl]; Ivp dye [iodinated diagnostic agents]; Percocet [oxycodone-acetaminophen]; Tape; and Toradol [ketorolac tromethamine]  Medications: Prior to Admission medications   Medication Sig Start Date End Date Taking? Authorizing Provider  albuterol (PROVENTIL HFA;VENTOLIN HFA) 108 (90 Base) MCG/ACT inhaler Inhale 1-2 puffs into the lungs every 6 (six) hours as needed for wheezing or shortness of breath. 07/24/16  Yes Waynetta Pean, PA-C  albuterol (PROVENTIL) (2.5 MG/3ML) 0.083% nebulizer solution Take 2.5 mg by nebulization every 6 (six) hours as needed for wheezing or shortness of breath.   Yes [provider]  fentaNYL (DURAGESIC - DOSED MCG/HR) 12 MCG/HR Place 1 patch (12.5 mcg total) onto the skin every 3 (three) days. 03/07/17  Yes Nicholas Lose, MD  furosemide (LASIX) 40 MG tablet Take 1  tablet (40 mg total) by mouth every other day. 03/05/17  Yes Arbutus Leas, NP  Melatonin 10 MG TABS Take 10 mg by mouth at bedtime.    Yes [provider]  morphine (MSIR) 30 MG tablet Take 1 tablet (30 mg total) by mouth every 4 (four) hours as needed for severe pain. 03/07/17  Yes Nicholas Lose, MD  ondansetron (ZOFRAN) 8 MG tablet Take 1  tablet (8 mg total) by mouth 2 (two) times daily as needed (Nausea or vomiting). 03/07/17  Yes Nicholas Lose, MD  senna (SENOKOT) 8.6 MG TABS tablet Take 1 tablet by mouth daily as needed for mild constipation.   Yes [provider]  amiodarone (PACERONE) 200 MG tablet Take 1 tablet (200 mg total) by mouth 2 (two) times daily. Take 400 mg by mouth twice daily for 7 days, then take 200 mg by mouth twice daily for 14 days, then take 200 mg by mouth daily 03/07/17   Nicholas Lose, MD  cholecalciferol (VITAMIN D) 1000 units tablet Take 1,000 Units by mouth daily.    [provider]  dexamethasone (DECADRON) 4 MG tablet Take 0.5 tablets (2 mg total) by mouth daily. 03/07/17   Nicholas Lose, MD  enoxaparin (LOVENOX) 100 MG/ML injection Inject 1 mL (100 mg total) into the skin every 12 (twelve) hours. 02/26/17   Hosie Poisson, MD  ferrous sulfate 325 (65 FE) MG EC tablet Take 325 mg by mouth 3 (three) times daily with meals.    [provider]  guaiFENesin-dextromethorphan (ROBITUSSIN DM) 100-10 MG/5ML syrup Take 5 mLs by mouth every 4 (four) hours as needed for cough. 02/26/17   Hosie Poisson, MD  losartan (COZAAR) 50 MG tablet Take 1 tablet (50 mg total) by mouth daily. 03/05/17   Arbutus Leas, NP  metoprolol succinate (TOPROL-XL) 50 MG 24 hr tablet Take 1 tablet (50 mg total) by mouth daily. Take with or immediately following a meal. 03/07/17   Nicholas Lose, MD  potassium chloride SA (K-DUR,KLOR-CON) 20 MEQ tablet Take 1 tablet (20 mEq total) by mouth daily. 03/07/17   Nicholas Lose, MD  tobramycin-dexamethasone [facility]) ophthalmic solution Place 2 drops into both eyes every 4 (four) hours while awake. 02/18/17   Gardenia Phlegm, NP     Family History  Problem Relation Age of Onset  . Lung cancer Mother 38       2 different types of lung cancer; metastasis to brain; smoker  . Other Mother        hx of hysterectomy for unspecified reason  . Bone cancer Maternal Uncle         dx. early 60s  . Breast cancer Maternal Grandmother        dx. early 64s, s/p mastectomy  . Cancer Paternal Grandfather        unspecified type of cancer, dx. late 68s  . Congestive Heart Failure Father        smoker  . Stroke Father   . Eczema Father   . Cirrhosis Maternal Grandfather   . Heart Problems Maternal Grandfather   . Asthma Daughter   . Allergies Daughter        hives  . Lung cancer Other        (maternal great uncle; MGM's brother); had a coal stove  . Cancer Cousin        unspecified type; d. early age (paternal first cousin once-removed)  . Cancer Cousin        dx. as  a kid; in remission today; (paternal 2nd cousin)  . Cancer Cousin        unspecified type; d. early 55s; (maternal 1st cousin)    Social History   Social History  . Marital status: Legally Separated    Spouse name: Chrissie Noa  . Number of children: 2  . Years of education: [name]   Occupational History  . Web designer    Social History Main Topics  . Smoking status: Never Smoker  . Smokeless tobacco: Never Used  . Alcohol use 0.0 oz/week     Comment: occassional glass of wine  . Drug use: No  . Sexual activity: Yes   Other Topics Concern  . None   Social History Narrative   ** Merged History Encounter **       First MP age 62.  LMP 08/26/15.     2 children carried to term.  Age at first live birth 10   H/o oral contrasception use       Review of Systems  Constitutional: Negative for fatigue and fever.  Respiratory: Negative for cough and shortness of breath.   Cardiovascular: Negative for chest pain.  Psychiatric/Behavioral: Negative for behavioral problems and confusion.    Vital Signs: BP 137/73 Comment: on right leg as both arms restricted  Pulse 87   Temp 99 F (37.2 C) (Oral)   Resp 16   SpO2 97%   Physical Exam  Constitutional: She is oriented to person, place, and time. She appears well-developed.  Cardiovascular: Normal rate and normal heart sounds.     Pulmonary/Chest: Effort normal. No respiratory distress.  Neurological: She is alert and oriented to person, place, and time.  Skin: Skin is warm and dry.  Psychiatric: She has a normal mood and affect. Her behavior is normal. Judgment and thought content normal.  Nursing note and vitals reviewed.   Mallampati Score:  MD Evaluation Airway: WNL Heart: WNL Abdomen: WNL Chest/ Lungs: WNL ASA  Classification: 3 Mallampati/Airway Score: Two  Imaging: Dg Chest 2 View  Result Date: 02/23/2017 CLINICAL DATA:  Acute onset of cough, shortness of breath and generalized chest pain. Nausea and vomiting. Initial encounter. EXAM: CHEST  2 VIEW COMPARISON:  Chest radiograph performed 02/21/2017 FINDINGS: The lungs are well-aerated. The patient's bilateral pulmonary metastases are again noted. No definite superimposed pneumonia is seen. There is no evidence of pleural effusion or pneumothorax. A right PICC is noted ending about the mid to distal SVC. The heart is mildly enlarged. No acute osseous abnormalities are seen. Clips are noted at the left axilla. IMPRESSION: Bilateral pulmonary metastases again noted. No superimposed focal airspace consolidation seen. Mild cardiomegaly. Electronically Signed   By: Garald Balding M.D.   On: 02/23/2017 00:14   Dg Chest 2 View  Result Date: 02/21/2017 CLINICAL DATA:  Shortness of breath and dry cough for 4 days. EXAM: CHEST  2 VIEW COMPARISON:  February 12, 2017, CT chest Jan 30, 2017 FINDINGS: The heart size and mediastinal contours are stable. The heart size is enlarged. Bilateral lung masses are identified consistent with patient's known metastatic disease as seen on prior chest CT. There is minimal left pleural effusion. There is no pulmonary edema. The visualized skeletal structures are stable. IMPRESSION: Bilateral lung masses consistent with patient's known metastatic disease. Minimal left pleural effusion. Electronically Signed   By: Abelardo Diesel M.D.   On:  02/21/2017 16:28   Ct Angio Chest Pe W And/or Wo Contrast  Result Date: 02/23/2017 CLINICAL DATA:  Short  of breath, metastatic breast cancer lung cancer. EXAM: CT ANGIOGRAPHY CHEST WITH CONTRAST TECHNIQUE: Multidetector CT imaging of the chest was performed using the standard protocol during bolus administration of intravenous contrast. Multiplanar CT image reconstructions and MIPs were obtained to evaluate the vascular anatomy. CONTRAST:  100 mL Isovue COMPARISON:  CT 01/30/2017 FINDINGS: Cardiovascular: There is a new filling defect within the RIGHT lobe pulmonary artery (image 39, series 8) consistent with acute thromboembolism. Thrombus is occlusive to the more distal RIGHT lower lobe pulmonary arteries. No additional pulmonary emboli. The RIGHT ventricle to LEFT ventricle ratio is less than 1 inconsistent with RIGHT heart strain There is extensive tumor burden surrounding the pulmonary arteries within LEFT RIGHT hila. Mediastinum/Nodes: Extensive adenopathy surrounding the LEFT or RIGHT main pulmonary arteries as well as the bronchus. Findings similar prior. Lungs/Pleura: The RIGHT lower lobe bronchus is compressed by a tumor (image 77, series 10). There is interval enlargement of a masslike infrahilar lesions on the RIGHT measuring 6.2 cm in maximum coronal dimension (series 6, series 10) compared with 5.6 cm on comparison CT. Significant infrahilar masslike lesion the LEFT additionally. Bilateral multiple discrete pulmonary nodules. Example nodule in the lingula measures 2.2 cm compared to 2.1 (image 36, series 9). LEFT upper lobe nodule measures 15 mm compared to 15 mm. Mass spanning the RIGHT middle lobe measures 4.1 cm in thickness compared to 3.3 (image 53, series 9 Upper Abdomen: Limited view of the liver, kidneys, pancreas are unremarkable. Normal adrenal glands. Musculoskeletal: No aggressive osseous lesion. Review of the MIP images confirms the above findings. IMPRESSION: 1. Acute pulmonary  embolism within the RIGHT lower lobe pulmonary artery which is occlusive. Overall clot burden is mild to moderate. No evidence of RIGHT ventricular strain. 2. Interval increase and RIGHT infrahilar mass with obstruction of the RIGHT lower bronchus. 3. Bulky hilar adenopathy on LEFT and RIGHT is similar as well as mediastinal adenopathy. 4. Individual bilateral nodules are similar in size. 5. Increase RIGHT infrahilar and RIGHT middle lobe mass lesions. 6. Overall evidence of progression of pulmonary metastatic disease. Critical Value/emergent results were called by telephone at the time of interpretation on 02/23/2017 at 4:03 pm to Dr. Dyanne Carrel , who verbally acknowledged these results. Electronically Signed   By: Suzy Bouchard M.D.   On: 02/23/2017 16:04    Labs:  CBC:  Recent Labs  02/25/17 0451 02/28/17 1232 03/07/17 1032 03/19/17 1309  WBC 11.3* 10.1 3.5* 4.5  HGB 9.3* 10.7* 9.7* 9.3*  HCT 29.9* 34.1* 29.8* 29.2*  PLT 415* 408* 376 354    COAGS:  Recent Labs  11/20/16 0615 02/11/17 1015 03/19/17 1309  INR 0.97 1.13 0.90  APTT 33 24 26    BMP:  Recent Labs  02/24/17 0658 02/25/17 0451 02/28/17 1232 03/05/17 1554 03/07/17 1032 03/19/17 1309  NA 133* 134* 136 135 136 139  K 3.1* 4.4 4.0 4.2 3.9 3.4*  CL 100* 101  --  101  --  105  CO2 27 26 27 24 24 26   GLUCOSE 95 117* 106 112* 125 85  BUN 14 13 23.4 19 23.6 18  CALCIUM 9.7 9.8 10.1 10.0 10.2 9.0  CREATININE 0.77 0.68 0.8 0.69 0.7 0.70  GFRNONAA >60 >60  --  >60  --  >60  GFRAA >60 >60  --  >60  --  >60    LIVER FUNCTION TESTS:  Recent Labs  01/24/17 1344 02/18/17 0840 02/28/17 1232 03/07/17 1032  BILITOT 0.51 0.43 0.90 0.28  AST 26  48* 99* 36*  ALT 46 92* 89* 67*  ALKPHOS 122 112 172* 112  PROT 7.4 6.4 7.2 6.5  ALBUMIN 2.7* 2.5* 2.4* 2.4*    TUMOR MARKERS: No results for input(s): AFPTM, CEA, CA199, CHROMGRNA in the last 8760 hours.  Assessment and Plan: Patient with history of metastatic  breast cancer s/p surgery and chemoradiation presents for [facility] placement today.   She has been NPO.   She held her lovenox appropriately.  Risks and Benefits discussed with the patient including, but not limited to bleeding, infection, pneumothorax, or fibrin sheath development and need for additional procedures. All of the patient's questions were answered, patient is agreeable to proceed. Consent signed and in chart.   Thank you for this interesting consult.  I greatly enjoyed meeting Kylieann K. Curvin and look forward to participating in their care.  A copy of this report was sent to the requesting provider on this date.  Electronically Signed: Docia Barrier, PA 03/19/2017, 2:41 PM   I spent a total of    15 Minutes in face to face in clinical consultation, greater than 50% of which was counseling/coordinating care for metastatic breast cancer.

## 2017-03-19 NOTE — Progress Notes (Signed)
May use PICC for lab draw and IV fluid pre procedure per Brynda Greathouse PA.

## 2017-03-19 NOTE — Discharge Instructions (Signed)
Implanted Port Home Guide °An implanted port is a type of central line that is placed under the skin. Central lines are used to provide IV access when treatment or nutrition needs to be given through a person’s veins. Implanted ports are used for long-term IV access. An implanted port may be placed because: °· You need IV medicine that would be irritating to the small veins in your hands or arms. °· You need long-term IV medicines, such as antibiotics. °· You need IV nutrition for a long period. °· You need frequent blood draws for lab tests. °· You need dialysis. ° °Implanted ports are usually placed in the chest area, but they can also be placed in the upper arm, the abdomen, or the leg. An implanted port has two main parts: °· Reservoir. The reservoir is round and will appear as a small, raised area under your skin. The reservoir is the part where a needle is inserted to give medicines or draw blood. °· Catheter. The catheter is a thin, flexible tube that extends from the reservoir. The catheter is placed into a large vein. Medicine that is inserted into the reservoir goes into the catheter and then into the vein. ° °How will I care for my incision site? °Do not get the incision site wet. Bathe or shower as directed by your health care provider. °How is my port accessed? °Special steps must be taken to access the port: °· Before the port is accessed, a numbing cream can be placed on the skin. This helps numb the skin over the port site. °· Your health care provider uses a sterile technique to access the port. °? Your health care provider must put on a mask and sterile gloves. °? The skin over your port is cleaned carefully with an antiseptic and allowed to dry. °? The port is gently pinched between sterile gloves, and a needle is inserted into the port. °· Only "non-coring" port needles should be used to access the port. Once the port is accessed, a blood return should be checked. This helps ensure that the port  is in the vein and is not clogged. °· If your port needs to remain accessed for a constant infusion, a clear (transparent) bandage will be placed over the needle site. The bandage and needle will need to be changed every week, or as directed by your health care provider. °· Keep the bandage covering the needle clean and dry. Do not get it wet. Follow your health care provider’s instructions on how to take a shower or bath while the port is accessed. °· If your port does not need to stay accessed, no bandage is needed over the port. ° °What is flushing? °Flushing helps keep the port from getting clogged. Follow your health care provider’s instructions on how and when to flush the port. Ports are usually flushed with saline solution or a medicine called heparin. The need for flushing will depend on how the port is used. °· If the port is used for intermittent medicines or blood draws, the port will need to be flushed: °? After medicines have been given. °? After blood has been drawn. °? As part of routine maintenance. °· If a constant infusion is running, the port may not need to be flushed. ° °How long will my port stay implanted? °The port can stay in for as long as your health care provider thinks it is needed. When it is time for the port to come out, surgery will be   done to remove it. The procedure is similar to the one performed when the port was put in. °When should I seek immediate medical care? °When you have an implanted port, you should seek immediate medical care if: °· You notice a bad smell coming from the incision site. °· You have swelling, redness, or drainage at the incision site. °· You have more swelling or pain at the port site or the surrounding area. °· You have a fever that is not controlled with medicine. ° °This information is not intended to replace advice given to you by your health care provider. Make sure you discuss any questions you have with your health care provider. °Document  Released: 08/20/2005 Document Revised: 01/26/2016 Document Reviewed: 04/27/2013 °Elsevier Interactive Patient Education © 2017 [employer] Inc[employer] °Moderate Conscious Sedation, Adult, Care After °These instructions provide you with information about caring for yourself after your procedure. Your health care provider may also give you more specific instructions. Your treatment has been planned according to current medical practices, but problems sometimes occur. Call your health care provider if you have any problems or questions after your procedure. °What can I expect after the procedure? °After your procedure, it is common: °· To feel sleepy for several hours. °· To feel clumsy and have poor balance for several hours. °· To have poor judgment for several hours. °· To vomit if you eat too soon. ° °Follow these instructions at home: °For at least 24 hours after the procedure: ° °· Do not: °? Participate in activities where you could fall or become injured. °? Drive. °? Use heavy machinery. °? Drink alcohol. °? Take sleeping pills or medicines that cause drowsiness. °? Make important decisions or sign legal documents. °? Take care of children on your own. °· Rest. °Eating and drinking °· Follow the diet recommended by your health care provider. °· If you vomit: °? Drink water, juice, or soup when you can drink without vomiting. °? Make sure you have little or no nausea before eating solid foods. °General instructions °· Have a responsible adult stay with you until you are awake and alert. °· Take over-the-counter and prescription medicines only as told by your health care provider. °· If you smoke, do not smoke without supervision. °· Keep all follow-up visits as told by your health care provider. This is important. °Contact a health care provider if: °· You keep feeling nauseous or you keep vomiting. °· You feel light-headed. °· You develop a rash. °· You have a fever. °Get help right away if: °· You have trouble  breathing. °This information is not intended to replace advice given to you by your health care provider. Make sure you discuss any questions you have with your health care provider. °Document Released: 06/10/2013 Document Revised: 01/23/2016 Document Reviewed: 12/10/2015 °Elsevier Interactive Patient Education © 2018 Elsevier Inc. ° °

## 2017-03-20 ENCOUNTER — Ambulatory Visit (HOSPITAL_BASED_OUTPATIENT_CLINIC_OR_DEPARTMENT_OTHER): Payer: Federal, State, Local not specified - PPO

## 2017-03-20 ENCOUNTER — Ambulatory Visit: Payer: Federal, State, Local not specified - PPO

## 2017-03-20 VITALS — BP 116/73 | HR 81 | Temp 99.6°F | Resp 20

## 2017-03-20 DIAGNOSIS — C50412 Malignant neoplasm of upper-outer quadrant of left female breast: Secondary | ICD-10-CM

## 2017-03-20 DIAGNOSIS — C7802 Secondary malignant neoplasm of left lung: Secondary | ICD-10-CM

## 2017-03-20 DIAGNOSIS — C7951 Secondary malignant neoplasm of bone: Secondary | ICD-10-CM

## 2017-03-20 DIAGNOSIS — C7801 Secondary malignant neoplasm of right lung: Secondary | ICD-10-CM

## 2017-03-20 DIAGNOSIS — D649 Anemia, unspecified: Secondary | ICD-10-CM

## 2017-03-20 DIAGNOSIS — E860 Dehydration: Secondary | ICD-10-CM

## 2017-03-20 DIAGNOSIS — Z5111 Encounter for antineoplastic chemotherapy: Secondary | ICD-10-CM | POA: Diagnosis not present

## 2017-03-20 DIAGNOSIS — C771 Secondary and unspecified malignant neoplasm of intrathoracic lymph nodes: Secondary | ICD-10-CM | POA: Diagnosis not present

## 2017-03-20 DIAGNOSIS — Z171 Estrogen receptor negative status [ER-]: Secondary | ICD-10-CM

## 2017-03-20 LAB — COMPREHENSIVE METABOLIC PANEL
ALK PHOS: 92 U/L (ref 40–150)
ALT: 37 U/L (ref 0–55)
AST: 21 U/L (ref 5–34)
Albumin: 2.8 g/dL — ABNORMAL LOW (ref 3.5–5.0)
Anion Gap: 10 mEq/L (ref 3–11)
BUN: 12 mg/dL (ref 7.0–26.0)
CHLORIDE: 104 meq/L (ref 98–109)
CO2: 23 meq/L (ref 22–29)
Calcium: 9.7 mg/dL (ref 8.4–10.4)
Creatinine: 0.8 mg/dL (ref 0.6–1.1)
GLUCOSE: 135 mg/dL (ref 70–140)
POTASSIUM: 3.9 meq/L (ref 3.5–5.1)
SODIUM: 136 meq/L (ref 136–145)
Total Bilirubin: 0.44 mg/dL (ref 0.20–1.20)
Total Protein: 6.5 g/dL (ref 6.4–8.3)

## 2017-03-20 LAB — CBC WITH DIFFERENTIAL/PLATELET
BASO%: 0.6 % (ref 0.0–2.0)
BASOS ABS: 0 10*3/uL (ref 0.0–0.1)
EOS ABS: 0 10*3/uL (ref 0.0–0.5)
EOS%: 0.1 % (ref 0.0–7.0)
HCT: 32.9 % — ABNORMAL LOW (ref 34.8–46.6)
HGB: 10.1 g/dL — ABNORMAL LOW (ref 11.6–15.9)
LYMPH%: 18.5 % (ref 14.0–49.7)
MCH: 28.1 pg (ref 25.1–34.0)
MCHC: 30.7 g/dL — AB (ref 31.5–36.0)
MCV: 91.4 fL (ref 79.5–101.0)
MONO#: 0.8 10*3/uL (ref 0.1–0.9)
MONO%: 12.3 % (ref 0.0–14.0)
NEUT#: 4.6 10*3/uL (ref 1.5–6.5)
NEUT%: 68.5 % (ref 38.4–76.8)
Platelets: 322 10*3/uL (ref 145–400)
RBC: 3.6 10*6/uL — AB (ref 3.70–5.45)
RDW: 21.8 % — AB (ref 11.2–14.5)
WBC: 6.8 10*3/uL (ref 3.9–10.3)
lymph#: 1.3 10*3/uL (ref 0.9–3.3)

## 2017-03-20 MED ORDER — SODIUM CHLORIDE 0.9% FLUSH
10.0000 mL | INTRAVENOUS | Status: DC | PRN
  Administered 2017-03-20: 10 mL
  Filled 2017-03-20: qty 10

## 2017-03-20 MED ORDER — SODIUM CHLORIDE 0.9 % IV SOLN
Freq: Once | INTRAVENOUS | Status: AC
  Administered 2017-03-20: 13:00:00 via INTRAVENOUS

## 2017-03-20 MED ORDER — PROCHLORPERAZINE MALEATE 10 MG PO TABS
ORAL_TABLET | ORAL | Status: AC
  Filled 2017-03-20: qty 1

## 2017-03-20 MED ORDER — HEPARIN SOD (PORK) LOCK FLUSH 100 UNIT/ML IV SOLN
500.0000 [IU] | Freq: Once | INTRAVENOUS | Status: AC | PRN
  Administered 2017-03-20: 500 [IU]
  Filled 2017-03-20: qty 5

## 2017-03-20 MED ORDER — PROCHLORPERAZINE MALEATE 10 MG PO TABS
10.0000 mg | ORAL_TABLET | Freq: Once | ORAL | Status: AC
  Administered 2017-03-20: 10 mg via ORAL

## 2017-03-20 MED ORDER — SODIUM CHLORIDE 0.9 % IJ SOLN
10.0000 mL | INTRAMUSCULAR | Status: DC | PRN
  Administered 2017-03-20: 10 mL via INTRAVENOUS
  Filled 2017-03-20: qty 10

## 2017-03-20 MED ORDER — SODIUM CHLORIDE 0.9 % IV SOLN
1.4300 mg/m2 | Freq: Once | INTRAVENOUS | Status: AC
  Administered 2017-03-20: 3 mg via INTRAVENOUS
  Filled 2017-03-20: qty 6

## 2017-03-20 NOTE — Patient Instructions (Signed)

## 2017-03-20 NOTE — Patient Instructions (Signed)
[facility] Discharge Instructions for Patients Receiving Chemotherapy  Today you received the following chemotherapy agents [place].  To help prevent nausea and vomiting after your treatment, we encourage you to take your nausea medication as prescribed.   If you develop nausea and vomiting that is not controlled by your nausea medication, call the clinic.   BELOW ARE SYMPTOMS THAT SHOULD BE REPORTED IMMEDIATELY:  *FEVER GREATER THAN 100.5 F  *CHILLS WITH OR WITHOUT FEVER  NAUSEA AND VOMITING THAT IS NOT CONTROLLED WITH YOUR NAUSEA MEDICATION  *UNUSUAL SHORTNESS OF BREATH  *UNUSUAL BRUISING OR BLEEDING  TENDERNESS IN MOUTH AND THROAT WITH OR WITHOUT PRESENCE OF ULCERS  *URINARY PROBLEMS  *BOWEL PROBLEMS  UNUSUAL RASH Items with * indicate a potential emergency and should be followed up as soon as possible.  Feel free to call the clinic you have any questions or concerns. The clinic phone number is [phone].  Please show the CHEMO ALERT CARD at check-in to the Emergency Department and triage nurse.   

## 2017-03-21 ENCOUNTER — Other Ambulatory Visit: Payer: Federal, State, Local not specified - PPO

## 2017-03-21 ENCOUNTER — Ambulatory Visit: Payer: Federal, State, Local not specified - PPO

## 2017-03-25 ENCOUNTER — Other Ambulatory Visit (HOSPITAL_COMMUNITY): Payer: Self-pay | Admitting: *Deleted

## 2017-03-25 ENCOUNTER — Telehealth: Payer: Self-pay

## 2017-03-25 MED ORDER — METOPROLOL SUCCINATE ER 50 MG PO TB24: 50.0000 mg | ORAL_TABLET | Freq: Every day | ORAL | 3 refills | Status: DC

## 2017-03-25 NOTE — Telephone Encounter (Signed)
[facility] called to clarify if pt was taking carvedilol or metoprolol. Per Dr Lindi Adie 6/28 note pt was to increase carvedilol after consult with Dr Haroldine Laws. On 7/5 an rx for metoprolol was [facility] to [facility] by Dr Lindi Adie with no note indicating this.  Instructed [facility] to contact Dr Haroldine Laws and if question not clarified to call [facility] again.

## 2017-04-01 ENCOUNTER — Ambulatory Visit: Payer: Federal, State, Local not specified - PPO

## 2017-04-01 ENCOUNTER — Other Ambulatory Visit: Payer: Federal, State, Local not specified - PPO

## 2017-04-03 ENCOUNTER — Ambulatory Visit (HOSPITAL_COMMUNITY)
Admission: RE | Admit: 2017-04-03 | Discharge: 2017-04-03 | Disposition: A | Discharge status: Home / Self Care | Payer: Federal, State, Local not specified - PPO | Source: Ambulatory Visit | Attending: Hematology and Oncology | Admitting: Hematology and Oncology

## 2017-04-03 ENCOUNTER — Telehealth: Payer: Self-pay

## 2017-04-03 DIAGNOSIS — I2699 Other pulmonary embolism without acute cor pulmonale: Secondary | ICD-10-CM

## 2017-04-03 DIAGNOSIS — C50412 Malignant neoplasm of upper-outer quadrant of left female breast: Secondary | ICD-10-CM

## 2017-04-03 NOTE — Telephone Encounter (Signed)
Doppler scheduled for 2 pm. Pt aware.

## 2017-04-03 NOTE — Telephone Encounter (Signed)
Pt was expecting to see Dr Lindi Adie tomorrow. She looked at my chart  Today and realized she has not appt with him. Pt requesting visit with Dr Lindi Adie tomorrow. Her left leg really bad. Constant, calf is swollen, no redness, a little warmer, she feels a knot, pain medicine is not helping. Leg has been hurting like this all week Her cough is back. OOT and had diarrhea and vomiting and was in hospital. She had a-fib too. She was at [facility] ( is in care everywhere) for 2 day Friday Saturday and released on Sunday. Put her on antibiotic for possible resp infection d/t flying. When she coughs her back will hurt and go down her left leg. She is coughing and bringing up creamy colored sputum.  BP was 195/95 down to 137/70 with laying down. Temp was 101 about 2 hours ago, now 99 after 2 tylenol.   S/w Dr Lindi Adie and he will see pt tomorrow.  Order placed for doppler of left leg. lvm with doppler schedulers.

## 2017-04-03 NOTE — Progress Notes (Signed)
*  PRELIMINARY RESULTS* Vascular Ultrasound Left lower extremity venous duplex has been completed.  Preliminary findings: No evidence of deep vein thrombosis or baker's cyst on today's exam. No evidence of prior thrombosis from 02/24/2017 at this time.  Suspicious area in left calf noted again, almost directly under where patient points to area of pain, 4.8cm in longest dimension with low resistant flow.  Preliminary results called to Dr. Lindi Adie @ 14:20, he plans on seeing the patient tomorrow at scheduled appointment.   Everrett Coombe 04/03/2017, 2:18 PM

## 2017-04-04 ENCOUNTER — Ambulatory Visit (HOSPITAL_BASED_OUTPATIENT_CLINIC_OR_DEPARTMENT_OTHER): Payer: Federal, State, Local not specified - PPO | Admitting: Hematology and Oncology

## 2017-04-04 ENCOUNTER — Encounter: Payer: Self-pay | Admitting: Hematology and Oncology

## 2017-04-04 ENCOUNTER — Other Ambulatory Visit (HOSPITAL_BASED_OUTPATIENT_CLINIC_OR_DEPARTMENT_OTHER): Payer: Federal, State, Local not specified - PPO

## 2017-04-04 ENCOUNTER — Ambulatory Visit (HOSPITAL_BASED_OUTPATIENT_CLINIC_OR_DEPARTMENT_OTHER): Payer: Federal, State, Local not specified - PPO

## 2017-04-04 ENCOUNTER — Ambulatory Visit: Payer: Federal, State, Local not specified - PPO

## 2017-04-04 ENCOUNTER — Other Ambulatory Visit: Payer: Self-pay

## 2017-04-04 ENCOUNTER — Other Ambulatory Visit: Payer: Self-pay | Admitting: Hematology and Oncology

## 2017-04-04 ENCOUNTER — Ambulatory Visit: Payer: Federal, State, Local not specified - PPO | Admitting: Hematology and Oncology

## 2017-04-04 VITALS — BP 122/84 | HR 107 | Resp 20

## 2017-04-04 DIAGNOSIS — E860 Dehydration: Secondary | ICD-10-CM

## 2017-04-04 DIAGNOSIS — C7951 Secondary malignant neoplasm of bone: Secondary | ICD-10-CM

## 2017-04-04 DIAGNOSIS — C50412 Malignant neoplasm of upper-outer quadrant of left female breast: Secondary | ICD-10-CM

## 2017-04-04 DIAGNOSIS — C7801 Secondary malignant neoplasm of right lung: Secondary | ICD-10-CM

## 2017-04-04 DIAGNOSIS — R0602 Shortness of breath: Secondary | ICD-10-CM

## 2017-04-04 DIAGNOSIS — Z171 Estrogen receptor negative status [ER-]: Secondary | ICD-10-CM

## 2017-04-04 DIAGNOSIS — C771 Secondary and unspecified malignant neoplasm of intrathoracic lymph nodes: Secondary | ICD-10-CM

## 2017-04-04 DIAGNOSIS — R05 Cough: Secondary | ICD-10-CM | POA: Diagnosis not present

## 2017-04-04 DIAGNOSIS — C7802 Secondary malignant neoplasm of left lung: Secondary | ICD-10-CM

## 2017-04-04 DIAGNOSIS — Z5111 Encounter for antineoplastic chemotherapy: Secondary | ICD-10-CM

## 2017-04-04 DIAGNOSIS — R2242 Localized swelling, mass and lump, left lower limb: Secondary | ICD-10-CM | POA: Diagnosis not present

## 2017-04-04 DIAGNOSIS — I481 Persistent atrial fibrillation: Secondary | ICD-10-CM | POA: Diagnosis not present

## 2017-04-04 DIAGNOSIS — D649 Anemia, unspecified: Secondary | ICD-10-CM

## 2017-04-04 DIAGNOSIS — R0902 Hypoxemia: Secondary | ICD-10-CM

## 2017-04-04 DIAGNOSIS — R0609 Other forms of dyspnea: Secondary | ICD-10-CM

## 2017-04-04 LAB — CBC WITH DIFFERENTIAL/PLATELET
BASO%: 0.5 % (ref 0.0–2.0)
BASOS ABS: 0 10*3/uL (ref 0.0–0.1)
EOS ABS: 0 10*3/uL (ref 0.0–0.5)
EOS%: 0.3 % (ref 0.0–7.0)
HCT: 34.3 % — ABNORMAL LOW (ref 34.8–46.6)
HGB: 10.7 g/dL — ABNORMAL LOW (ref 11.6–15.9)
LYMPH%: 31.7 % (ref 14.0–49.7)
MCH: 28 pg (ref 25.1–34.0)
MCHC: 31.2 g/dL — ABNORMAL LOW (ref 31.5–36.0)
MCV: 89.8 fL (ref 79.5–101.0)
MONO#: 1.2 10*3/uL — ABNORMAL HIGH (ref 0.1–0.9)
MONO%: 18.9 % — AB (ref 0.0–14.0)
NEUT#: 3.1 10*3/uL (ref 1.5–6.5)
NEUT%: 48.6 % (ref 38.4–76.8)
PLATELETS: 480 10*3/uL — AB (ref 145–400)
RBC: 3.82 10*6/uL (ref 3.70–5.45)
RDW: 19.9 % — ABNORMAL HIGH (ref 11.2–14.5)
WBC: 6.4 10*3/uL (ref 3.9–10.3)
lymph#: 2 10*3/uL (ref 0.9–3.3)

## 2017-04-04 LAB — COMPREHENSIVE METABOLIC PANEL
ALT: 277 U/L (ref 0–55)
AST: 312 U/L (ref 5–34)
Albumin: 2.7 g/dL — ABNORMAL LOW (ref 3.5–5.0)
Alkaline Phosphatase: 238 U/L — ABNORMAL HIGH (ref 40–150)
Anion Gap: 8 mEq/L (ref 3–11)
BUN: 15.2 mg/dL (ref 7.0–26.0)
CHLORIDE: 101 meq/L (ref 98–109)
CO2: 25 meq/L (ref 22–29)
Calcium: 9.9 mg/dL (ref 8.4–10.4)
Creatinine: 0.8 mg/dL (ref 0.6–1.1)
GLUCOSE: 96 mg/dL (ref 70–140)
POTASSIUM: 3.8 meq/L (ref 3.5–5.1)
SODIUM: 135 meq/L — AB (ref 136–145)
Total Bilirubin: 0.64 mg/dL (ref 0.20–1.20)
Total Protein: 6.8 g/dL (ref 6.4–8.3)

## 2017-04-04 LAB — TECHNOLOGIST REVIEW: Technologist Review: 4

## 2017-04-04 MED ORDER — FUROSEMIDE 10 MG/ML IJ SOLN
INTRAMUSCULAR | Status: AC
  Filled 2017-04-04: qty 2

## 2017-04-04 MED ORDER — SODIUM CHLORIDE 0.9% FLUSH
10.0000 mL | INTRAVENOUS | Status: DC | PRN
  Administered 2017-04-04: 10 mL
  Filled 2017-04-04: qty 10

## 2017-04-04 MED ORDER — PROCHLORPERAZINE MALEATE 10 MG PO TABS
ORAL_TABLET | ORAL | Status: AC
  Filled 2017-04-04: qty 1

## 2017-04-04 MED ORDER — SODIUM CHLORIDE 0.9 % IJ SOLN
10.0000 mL | INTRAMUSCULAR | Status: DC | PRN
  Administered 2017-04-04: 10 mL via INTRAVENOUS
  Filled 2017-04-04: qty 10

## 2017-04-04 MED ORDER — FUROSEMIDE 10 MG/ML IJ SOLN
20.0000 mg | Freq: Once | INTRAMUSCULAR | Status: AC
  Administered 2017-04-04: 20 mg via INTRAVENOUS

## 2017-04-04 MED ORDER — PROCHLORPERAZINE MALEATE 10 MG PO TABS
10.0000 mg | ORAL_TABLET | Freq: Once | ORAL | Status: AC
  Administered 2017-04-04: 10 mg via ORAL

## 2017-04-04 MED ORDER — HEPARIN SOD (PORK) LOCK FLUSH 100 UNIT/ML IV SOLN
500.0000 [IU] | Freq: Once | INTRAVENOUS | Status: AC | PRN
  Administered 2017-04-04: 500 [IU]
  Filled 2017-04-04: qty 5

## 2017-04-04 MED ORDER — SODIUM CHLORIDE 0.9 % IV SOLN
Freq: Once | INTRAVENOUS | Status: AC
  Administered 2017-04-04: 13:00:00 via INTRAVENOUS

## 2017-04-04 MED ORDER — ERIBULIN MESYLATE CHEMO INJECTION 1 MG/2ML
0.7100 mg/m2 | Freq: Once | INTRAVENOUS | Status: AC
  Administered 2017-04-04: 1.5 mg via INTRAVENOUS
  Filled 2017-04-04: qty 3

## 2017-04-04 NOTE — Addendum Note (Signed)
Addended by: Nicholas Lose on: 04/04/2017 12:38 PM   Modules accepted: Orders

## 2017-04-04 NOTE — Patient Instructions (Signed)

## 2017-04-04 NOTE — Progress Notes (Signed)
Pt came in the office for MD visit and was found to have o2 saturation of 88% on room air. Pt has worsening cough and shortness of breath. Per [name], obtained order for 02 home use referral. Notified Brad from [facility].

## 2017-04-04 NOTE — Progress Notes (Signed)
@   1315 after returning from the bathroom, pt was SOB, respiration rate 22, O2 SAT was 96, put on 1.5 L O2  @1320  O2 SAT up to 98 and respiration rate of 20. Pt comfortable.

## 2017-04-04 NOTE — Assessment & Plan Note (Signed)
Left lumpectomy 02/13/2016: IDC grade 3, 3.3 cm, margins negative, 0/3 lymph nodes negative, triple negative, T2 N0 stage II a pathologic stage  Left breast biopsy 08/17/2015:1:30 position: Invasive ductal carcinoma, grade 3, ER 0%, PR 0%, HER-2 negative ratio 1.48, Ki-67 90%, 3.1 cm tumor, axilla negative, T2 N0 stage II a clinical stage Breast biopsies x 2: 09/28/2015 : [place]  Treatment summary: 1. Completed Cycle 4 dose dense Adriamycin and Cytoxan on 10/31/15; completed cycle 3 Taxol and carboplatin, completed 7 cycles of Taxol and treatment discontinued for poor tolerance. 2. left lumpectomy 02/13/2016  3. Adjuvant radiation 03/26/2016 to09/07/2016 Patient was offered treatment with Xeloda as well as immunotherapy clinical trial. She refused both of those options. ----------------------------------------------------------------------------------------------------------------------------------------------------- Relapse: CT chest 11/13/2016 revealed multiple lung nodules and mediastinal lymph nodes 11/20/2016: Lung biopsy: Metastatic breast cancer triple negative PET/CT scan 12/04/2016: Multiple bilateral lung nodules or masses, 12 mm LUL, to 3 cm masses RML, 2.3 cm posterior left lung base, extensive thoracic lymphadenopathy 2.9 cm prevascular, 2.2 cm subcarinal, 2 cm left hilar, 1.5 cm right infrahilar, 1.5 cm right IMA , additional bone metastases right acetabulum CT chest 01/30/2017 demonstrates worsened tumor burden with significant abnormal nodal enlargement in chest, new and enlarged pulmonary nodules and masses, new and enlarged right breast and subcutaneous masses and nodules, trace bilateral pleural effusions  Xeloda from April to June, stopped due to progression  ----------------------------------------------------------------------------------------------------------------------------------------------------- Current treatment: Cycle 3 day 1 Halaven Patient did not receive  cycle 2 day 8 because of hospitalization for pneumonia.  Leg swelling and pain: Ultrasound 04/03/2017: No evidence of DVT.Marland Kitchen Incidental suspicious heterogeneous mass left proximal calf 4.8 x 3.8 cm Concerning for metastatic disease.  Worsening cough and shortness of breath: Related to underlying cardiac etiology. Our plan is to continue and finish up cycle 3 and then perform scans.

## 2017-04-04 NOTE — Progress Notes (Signed)
OK to give treatment with alk phos 238, ast 312, alt 277.   Dr Lindi Adie to decrease Halaven dose

## 2017-04-04 NOTE — Patient Instructions (Addendum)
[facility] Discharge Instructions for Patients Receiving Chemotherapy  Today you received the following chemotherapy agents:  Halaven (eribulin mesylate)  To help prevent nausea and vomiting after your treatment, we encourage you to take your nausea medication as prescribed.   If you develop nausea and vomiting that is not controlled by your nausea medication, call the clinic.   BELOW ARE SYMPTOMS THAT SHOULD BE REPORTED IMMEDIATELY:  *FEVER GREATER THAN 100.5 F  *CHILLS WITH OR WITHOUT FEVER  NAUSEA AND VOMITING THAT IS NOT CONTROLLED WITH YOUR NAUSEA MEDICATION  *UNUSUAL SHORTNESS OF BREATH  *UNUSUAL BRUISING OR BLEEDING  TENDERNESS IN MOUTH AND THROAT WITH OR WITHOUT PRESENCE OF ULCERS  *URINARY PROBLEMS  *BOWEL PROBLEMS  UNUSUAL RASH Items with * indicate a potential emergency and should be followed up as soon as possible.  Feel free to call the clinic you have any questions or concerns. The clinic phone number is [phone].  Please show the [facility] at check-in to the Emergency Department and triage nurse.    Furosemide injection What is this medicine? FUROSEMIDE (fyoor [place] se mide) is a diuretic. It helps you make more urine and to lose salt and excess water. This medicine is used to treat high blood pressure, and edema or swelling from heart, kidney, or liver disease. This medicine may be used for other purposes; ask your health care provider or pharmacist if you have questions. What should I tell my health care provider before I take this medicine? They need to know if you have any of these conditions: -abnormal blood electrolytes -diarrhea or vomiting -gout -heart disease -kidney disease, small amounts of urine, or difficulty passing urine -liver disease -premature newborn -thyroid disease -an unusual or allergic reaction to furosemide, sulfa drugs, other medicines, foods, dyes, or preservatives -pregnant or trying to get  pregnant -breast-feeding How should I use this medicine? This medicine is for injection into a muscle or a vein. It is given by a healthcare professional in a hospital or clinic setting. Talk to your pediatrician regarding the use of this medicine in children. While this drug may be prescribed for selected conditions, precautions do apply. Overdosage: If you think you have taken too much of this medicine contact a poison control center or emergency room at once. NOTE: This medicine is only for you. Do not share this medicine with others. What if I miss a dose? This does not apply. What may interact with this medicine? -aspirin and aspirin-like medicines -certain antibiotics -chloral hydrate -cisplatin -cyclosporine -digoxin -diuretics -laxatives -lithium -medicines for blood pressure -medicines that relax muscles for surgery -methotrexate -NSAIDS, medicines for pain and inflammation like ibuprofen, naproxen, or indomethacin -phenytoin -steroid medicines like prednisone or cortisone -sucralfate -thyroid hormones This list may not describe all possible interactions. Give your health care provider a list of all the medicines, herbs, non-prescription drugs, or dietary supplements you use. Also tell them if you smoke, drink alcohol, or use illegal drugs. Some items may interact with your medicine. What should I watch for while using this medicine? You will be monitored closely while you are on this medicine. This medicine can increase the amount of sugar in blood or urine. If you are a diabetic your sugar will need to be checked. You may get drowsy or dizzy. Do not drive, use machinery, or do anything that needs mental alertness until you know how this drug affects you. Do not stand or sit up quickly, especially if you are an older patient.  This reduces the risk of dizzy or fainting spells. Alcohol can make you more drowsy and dizzy. Avoid alcoholic drinks. This medicine can make you more  sensitive to the sun. Keep out of the sun. If you cannot avoid being in the sun, wear protective clothing and use sunscreen. Do not use sun lamps or tanning beds/booths. What side effects may I notice from receiving this medicine? Side effects that you should report to your doctor or health care professional as soon as possible: -blood in urine or stools -dry mouth -fever or chills -hearing loss or ringing in the ears -irregular heartbeat -muscle pain or weakness, cramps -stomach upset, pain, or nausea -tingling or numbness in the hands or feet -unusually weak or tired -vomiting or diarrhea -yellowing of the eyes or skin Side effects that usually do not require medical attention (report to your doctor or health care professional if they continue or are bothersome): -headache -loss of appetite -unusual bleeding or bruising This list may not describe all possible side effects. Call your doctor for medical advice about side effects. You may report side effects to FDA at 1-800-FDA-1088. Where should I keep my medicine? This drug is given in a hospital or clinic and will not be stored at home. NOTE: This sheet is a summary. It may not cover all possible information. If you have questions about this medicine, talk to your doctor, pharmacist, or health care provider.  2018 Elsevier/Gold Standard (2014-11-10 13:53:29)

## 2017-04-04 NOTE — Progress Notes (Addendum)
Patient Care Team: [name], MD as PCP - General (Internal Medicine) Nicholas Lose, MD as Consulting Physician (Hematology and Oncology)  DIAGNOSIS:  Encounter Diagnosis  Name Primary?  . Malignant neoplasm of upper-outer quadrant of left female breast, unspecified estrogen receptor status ([place])     SUMMARY OF ONCOLOGIC HISTORY:   Breast cancer of upper-outer quadrant of left female breast ([place])   08/17/2015 Initial Diagnosis    left breast biopsy 1:30 position: Invasive ductal carcinoma, grade 3, ER 0%, PR 0%, HER-2 negative ratio 1.48, Ki-67 90%, 3.1 cm tumor, axilla negative, T2 N0 stage II a clinical stage      09/19/2015 - 01/03/2016 Neo-Adjuvant Chemotherapy    Neoadjuvant chemotherapy with dose dense Adriamycin and Cytoxan 4 followed by Carbo/Taxol x 3 cycles (Carbo d/c'd after cycle #3 d/t hospitalization for N&V/dehydration). Went on to complete additional 3 cycles of Taxol alone.       09/28/2015 Procedure     Left breast biopsy anterior third left upper outer quadrant: [facility];  upper inner quadrant: [place]      10/06/2015 Procedure    Genetic testing: Negative. Genes analyzed: ATM, BARD1, BRCA1, BRCA2, BRIP1, CDH1, CHEK2, FANCC, MLH1, MSH2, MSH6, NBN, PALB2, PMS2, PTEN, RAD51C, RAD51D, TP53, and XRCC2.  This panel also includes deletion/duplication analysis (without sequencing) for one gene, EPCAM      01/16/2016 Breast MRI    significant response to chemotherapy with decrease in the tumor from 5.1 cm to 2.9 cm, also decrease a non-mass enhancement      02/13/2016 Surgery    Left lumpectomy ([place]): IDC grade 3, 3.3 cm, margins negative, 0/3 lymph nodes negative, triple negative, T2 N0 stage II a pathologic stage      02/21/2016 Surgery    Bilateral breast mammoplasty ([place]); no malignancy on path      03/26/2016 - 05/10/2016 Radiation Therapy    Adjuvant radiation therapy Lisbeth Renshaw). Left breast: 50.4 Gy in 28 fractions. Left breast boost: 10 Gy in 5  fractions.       11/13/2016 Imaging    CT chest: Extensive adenopathy throughout anterior mediastinum largest 2.6 cm,Ln at aortic arch 2.8 cm, subcarinal LN 2.8 cm, left hilar LN 1.6 cm, right hilar LN 1.8 cm, lung nodules bilaterally largest right lung 2.6 cm      11/20/2016 Relapse/Recurrence    Lung biopsy left lower quadrant: Metastatic carcinoma consistent with breast      12/04/2016 PET scan    Multiple bilateral lung nodules or masses, 12 mm LUL, to 3 cm masses RML, 2.3 cm posterior left lung base, extensive thoracic lymphadenopathy 2.9 cm prevascular, 2.2 cm subcarinal, 2 cm left hilar, 1.5 cm right infrahilar, 1.5 cm right IMA , additional bone metastases right acetabulum      12/20/2016 - 01/28/2017 Chemotherapy    Xeloda 1500 mg by mouth twice a day 2 weeks on one week off       02/28/2017 -  Chemotherapy    Halaven palliative chemotherapy        03/22/2017 - 03/24/2017 Hospital Admission    Hospitalization for pneumonia       CHIEF COMPLIANT:  Worsening cough shortness of breath, recent hospitalization [place] for pneumonia, cycle 3 day 1 Halaven  INTERVAL HISTORY: Cindy Bradshaw is a  46 year old with above-mentioned history metastatic breast cancer who is currently on Halaven palliative chemotherapy. She missed cycle 2 day 8 because she was in the hospital in [place] with the clinical diagnosis of pneumonia. She complained of left  calf pain and she had an ultrasound of the leg. Ultrasound revealed an large 4.8 cm mass in the medial left calf suggestive of metastatic deposit. She is also extremely short of breath and having profound cough. This is making it even difficult to walk or function or even talk for that matter.  Her oxygen levels are low today at 88%.  REVIEW OF SYSTEMS:   Constitutional: Denies fevers, chills or abnormal weight loss Eyes: Denies blurriness of vision Ears, nose, mouth, throat, and face: Denies mucositis or sore throat Respiratory:  Cough  shortness of breath Cardiovascular: Denies palpitation, chest discomfort Gastrointestinal:  Denies nausea, heartburn or change in bowel habits Skin: Denies abnormal skin rashes Neurological: generalized weakness Behavioral/Psych:  Appears depressed  Extremities:  Left calf tenderness  All other systems were reviewed with the patient and are negative.  I have reviewed the past medical history, past surgical history, social history and family history with the patient and they are unchanged from previous note.  ALLERGIES:  is allergic to dilaudid [hydromorphone hcl]; ivp dye [iodinated diagnostic agents]; percocet [oxycodone-acetaminophen]; tape; and toradol [ketorolac tromethamine].  MEDICATIONS:  Current Outpatient Prescriptions  Medication Sig Dispense Refill  . albuterol (PROVENTIL HFA;VENTOLIN HFA) 108 (90 Base) MCG/ACT inhaler Inhale 1-2 puffs into the lungs every 6 (six) hours as needed for wheezing or shortness of breath. 1 Inhaler 0  . albuterol (PROVENTIL) (2.5 MG/3ML) 0.083% nebulizer solution Take 2.5 mg by nebulization every 6 (six) hours as needed for wheezing or shortness of breath.    Marland Kitchen amiodarone (PACERONE) 200 MG tablet Take 1 tablet (200 mg total) by mouth 2 (two) times daily. Take 400 mg by mouth twice daily for 7 days, then take 200 mg by mouth twice daily for 14 days, then take 200 mg by mouth daily 60 tablet 5  . cholecalciferol (VITAMIN D) 1000 units tablet Take 1,000 Units by mouth daily.    Marland Kitchen dexamethasone (DECADRON) 4 MG tablet Take 0.5 tablets (2 mg total) by mouth daily. 30 tablet 3  . enoxaparin (LOVENOX) 100 MG/ML injection Inject 1 mL (100 mg total) into the skin every 12 (twelve) hours. 30 Syringe 2  . fentaNYL (DURAGESIC - DOSED MCG/HR) 12 MCG/HR Place 1 patch (12.5 mcg total) onto the skin every 3 (three) days. 10 patch 0  . ferrous sulfate 325 (65 FE) MG EC tablet Take 325 mg by mouth 3 (three) times daily with meals.    . furosemide (LASIX) 40 MG tablet Take  1 tablet (40 mg total) by mouth every other day. 15 tablet 3  . guaiFENesin-dextromethorphan (ROBITUSSIN DM) 100-10 MG/5ML syrup Take 5 mLs by mouth every 4 (four) hours as needed for cough. 118 mL 0  . losartan (COZAAR) 50 MG tablet Take 1 tablet (50 mg total) by mouth daily. 30 tablet 3  . Melatonin 10 MG TABS Take 10 mg by mouth at bedtime.     . metoprolol succinate (TOPROL-XL) 50 MG 24 hr tablet Take 1 tablet (50 mg total) by mouth daily. Take with or immediately following a meal. 30 tablet 3  . morphine (MSIR) 30 MG tablet Take 1 tablet (30 mg total) by mouth every 4 (four) hours as needed for severe pain. 60 tablet 0  . ondansetron (ZOFRAN) 8 MG tablet Take 1 tablet (8 mg total) by mouth 2 (two) times daily as needed (Nausea or vomiting). 30 tablet 1  . potassium chloride SA (K-DUR,KLOR-CON) 20 MEQ tablet Take 1 tablet (20 mEq total) by mouth daily.  30 tablet 3  . senna (SENOKOT) 8.6 MG TABS tablet Take 1 tablet by mouth daily as needed for mild constipation.    Marland Kitchen tobramycin-dexamethasone (TOBRADEX) ophthalmic solution Place 2 drops into both eyes every 4 (four) hours while awake. 5 mL 0   No current facility-administered medications for this visit.    Facility-Administered Medications Ordered in Other Visits  Medication Dose Route Frequency Provider Last Rate Last Dose  . 0.9 %  sodium chloride infusion   Intravenous Once Boelter, Genelle Gather, NP        PHYSICAL EXAMINATION: ECOG PERFORMANCE STATUS: 1 - Symptomatic but completely ambulatory  Vitals:   04/04/17 1133  BP: 121/80  Pulse: 76  Resp: 20  Temp: 99.8 F (37.7 C)   Filed Weights   04/04/17 1133  Weight: 212 lb 12.8 oz (96.5 kg)    GENERAL:alert, no distress and comfortable SKIN: skin color, texture, turgor are normal, no rashes or significant lesions EYES: normal, Conjunctiva are pink and non-injected, sclera clear OROPHARYNX:no exudate, no erythema and lips, buccal mucosa, and tongue normal  NECK: supple, thyroid  normal size, non-tender, without nodularity LYMPH:  no palpable lymphadenopathy in the cervical, axillary or inguinal LUNGS:  Bilateral coarse crackles HEART:  Irregularly irregular from atrial fibrillation ABDOMEN:abdomen soft, non-tender and normal bowel sounds MUSCULOSKELETAL:no cyanosis of digits and no clubbing  NEURO: alert & oriented x 3 with fluent speech, no focal motor/sensory deficits EXTREMITIES:  Tenderness in the left calf   LABORATORY DATA:  I have reviewed the data as listed   Chemistry      Component Value Date/Time   NA 136 03/20/2017 1119   K 3.9 03/20/2017 1119   CL 105 03/19/2017 1309   CO2 23 03/20/2017 1119   BUN 12.0 03/20/2017 1119   CREATININE 0.8 03/20/2017 1119      Component Value Date/Time   CALCIUM 9.7 03/20/2017 1119   ALKPHOS 92 03/20/2017 1119   AST 21 03/20/2017 1119   ALT 37 03/20/2017 1119   BILITOT 0.44 03/20/2017 1119       Lab Results  Component Value Date   WBC 6.4 04/04/2017   HGB 10.7 (L) 04/04/2017   HCT 34.3 (L) 04/04/2017   MCV 89.8 04/04/2017   PLT 480 (H) 04/04/2017   NEUTROABS 3.1 04/04/2017    ASSESSMENT & PLAN:  Breast cancer of upper-outer quadrant of left female breast ([place]) Left lumpectomy 02/13/2016: IDC grade 3, 3.3 cm, margins negative, 0/3 lymph nodes negative, triple negative, T2 N0 stage II a pathologic stage  Left breast biopsy 08/17/2015:1:30 position: Invasive ductal carcinoma, grade 3, ER 0%, PR 0%, HER-2 negative ratio 1.48, Ki-67 90%, 3.1 cm tumor, axilla negative, T2 N0 stage II a clinical stage Breast biopsies x 2: 09/28/2015 : [place]  Treatment summary: 1. Completed Cycle 4 dose dense Adriamycin and Cytoxan on 10/31/15; completed cycle 3 Taxol and carboplatin, completed 7 cycles of Taxol and treatment discontinued for poor tolerance. 2. left lumpectomy 02/13/2016  3. Adjuvant radiation 03/26/2016 to09/07/2016 Patient was offered treatment with Xeloda as well as immunotherapy clinical trial. She  refused both of those options. ----------------------------------------------------------------------------------------------------------------------------------------------------- Relapse: CT chest 11/13/2016 revealed multiple lung nodules and mediastinal lymph nodes 11/20/2016: Lung biopsy: Metastatic breast cancer triple negative PET/CT scan 12/04/2016: Multiple bilateral lung nodules or masses, 12 mm LUL, to 3 cm masses RML, 2.3 cm posterior left lung base, extensive thoracic lymphadenopathy 2.9 cm prevascular, 2.2 cm subcarinal, 2 cm left hilar, 1.5 cm right infrahilar, 1.5 cm right IMA ,  additional bone metastases right acetabulum CT chest 01/30/2017 demonstrates worsened tumor burden with significant abnormal nodal enlargement in chest, new and enlarged pulmonary nodules and masses, new and enlarged right breast and subcutaneous masses and nodules, trace bilateral pleural effusions  Xeloda from April to June, stopped due to progression  ----------------------------------------------------------------------------------------------------------------------------------------------------- Current treatment: Cycle 3 day 1 [place] Patient did not receive cycle 2 day 8 because of hospitalization for pneumonia.  Leg swelling and pain: Ultrasound 04/03/2017: No evidence of DVT.Marland Kitchen Incidental suspicious heterogeneous mass left proximal calf 4.8 x 3.8 cm Concerning for metastatic disease.  Worsening cough and shortness of breath: Related to underlying cardiac etiology.I will request cardiology to see the patient sooner. I will administer 20 mg of Lasix IV with her chemotherapy. Atrial fibrillation  Our plan is to continue and finish up cycle 3 and then perform scans.  I will see the patient back in 3 weeks after scans. I spent 25 minutes talking to the patient of which more than half was spent in counseling and coordination of care.  Orders Placed This Encounter  Procedures  . CT Chest W Contrast      Standing Status:   Future    Standing Expiration Date:   04/04/2018    Order Specific Question:   If indicated for the ordered procedure, I authorize the administration of contrast media per Radiology protocol    Answer:   Yes    Order Specific Question:   Is patient pregnant?    Answer:   No    Order Specific Question:   Preferred imaging location?    Answer:   [facility]    Order Specific Question:   Call Results- Best Contact Number?    Answer:   Metastatic breast cancer restaging    Order Specific Question:   Radiology Contrast Protocol - do NOT remove file path    Answer:   [email or web address]  . CT Abdomen Pelvis W Contrast    Standing Status:   Future    Standing Expiration Date:   04/04/2018    Order Specific Question:   If indicated for the ordered procedure, I authorize the administration of contrast media per Radiology protocol    Answer:   Yes    Order Specific Question:   Is patient pregnant?    Answer:   No    Order Specific Question:   Preferred imaging location?    Answer:   [facility]    Order Specific Question:   Radiology Contrast Protocol - do NOT remove file path    Answer:   [email or web address]    Order Specific Question:   Reason for Exam additional comments    Answer:   Metastatic breast cancer restaging   The patient has a good understanding of the overall plan. she agrees with it. she will call with any problems that may develop before the next visit here.   Rulon Eisenmenger, MD 04/04/17  Addendum: Patient's LFTs came back extremely high with AST of 312 and ALT of 277 with an alkaline phosphatase of 238. I reviewed the dosing guidelines for Halaven. It appears that she has Marcello Moores class B. Based on this her dose needs to be 0.7 mg/m. We will carefully administered this dose and monitor her LFTs. I suspect the reason for elevation of liver function tests is metastatic disease in the  liver. If he did not re-initiate treatment,patient is likely not to do well at all.. Because of this we are going ahead  with her chemotherapy treatment at this reduced dosage of Halaven.

## 2017-04-08 ENCOUNTER — Ambulatory Visit: Payer: Federal, State, Local not specified - PPO | Admitting: Hematology and Oncology

## 2017-04-10 ENCOUNTER — Other Ambulatory Visit: Payer: Self-pay | Admitting: Emergency Medicine

## 2017-04-10 DIAGNOSIS — C50412 Malignant neoplasm of upper-outer quadrant of left female breast: Secondary | ICD-10-CM

## 2017-04-11 ENCOUNTER — Other Ambulatory Visit: Payer: Self-pay

## 2017-04-11 ENCOUNTER — Emergency Department (HOSPITAL_COMMUNITY): Payer: Federal, State, Local not specified - PPO

## 2017-04-11 ENCOUNTER — Ambulatory Visit: Payer: Federal, State, Local not specified - PPO

## 2017-04-11 ENCOUNTER — Encounter (HOSPITAL_COMMUNITY): Payer: Self-pay | Admitting: *Deleted

## 2017-04-11 ENCOUNTER — Ambulatory Visit: Payer: Federal, State, Local not specified - PPO | Admitting: Hematology and Oncology

## 2017-04-11 ENCOUNTER — Telehealth: Payer: Self-pay | Admitting: *Deleted

## 2017-04-11 ENCOUNTER — Inpatient Hospital Stay (HOSPITAL_COMMUNITY)
Admission: EM | Admit: 2017-04-11 | Discharge: 2017-04-27 | DRG: 180 | Disposition: A | Discharge status: Home Health | Payer: Federal, State, Local not specified - PPO | Attending: Family Medicine | Admitting: Family Medicine

## 2017-04-11 ENCOUNTER — Other Ambulatory Visit: Payer: Federal, State, Local not specified - PPO

## 2017-04-11 DIAGNOSIS — Z515 Encounter for palliative care: Secondary | ICD-10-CM

## 2017-04-11 DIAGNOSIS — Z7901 Long term (current) use of anticoagulants: Secondary | ICD-10-CM | POA: Diagnosis not present

## 2017-04-11 DIAGNOSIS — C50412 Malignant neoplasm of upper-outer quadrant of left female breast: Secondary | ICD-10-CM | POA: Diagnosis not present

## 2017-04-11 DIAGNOSIS — Z9981 Dependence on supplemental oxygen: Secondary | ICD-10-CM | POA: Diagnosis not present

## 2017-04-11 DIAGNOSIS — R0609 Other forms of dyspnea: Secondary | ICD-10-CM

## 2017-04-11 DIAGNOSIS — G43909 Migraine, unspecified, not intractable, without status migrainosus: Secondary | ICD-10-CM | POA: Diagnosis present

## 2017-04-11 DIAGNOSIS — K761 Chronic passive congestion of liver: Secondary | ICD-10-CM | POA: Diagnosis present

## 2017-04-11 DIAGNOSIS — C7800 Secondary malignant neoplasm of unspecified lung: Secondary | ICD-10-CM | POA: Diagnosis present

## 2017-04-11 DIAGNOSIS — J9601 Acute respiratory failure with hypoxia: Secondary | ICD-10-CM | POA: Diagnosis present

## 2017-04-11 DIAGNOSIS — R0602 Shortness of breath: Secondary | ICD-10-CM | POA: Diagnosis not present

## 2017-04-11 DIAGNOSIS — R590 Localized enlarged lymph nodes: Secondary | ICD-10-CM | POA: Diagnosis present

## 2017-04-11 DIAGNOSIS — R740 Nonspecific elevation of levels of transaminase and lactic acid dehydrogenase [LDH]: Secondary | ICD-10-CM | POA: Diagnosis not present

## 2017-04-11 DIAGNOSIS — R1110 Vomiting, unspecified: Secondary | ICD-10-CM

## 2017-04-11 DIAGNOSIS — Z171 Estrogen receptor negative status [ER-]: Secondary | ICD-10-CM

## 2017-04-11 DIAGNOSIS — Z86711 Personal history of pulmonary embolism: Secondary | ICD-10-CM | POA: Diagnosis not present

## 2017-04-11 DIAGNOSIS — Z853 Personal history of malignant neoplasm of breast: Secondary | ICD-10-CM | POA: Diagnosis not present

## 2017-04-11 DIAGNOSIS — I480 Paroxysmal atrial fibrillation: Secondary | ICD-10-CM | POA: Diagnosis present

## 2017-04-11 DIAGNOSIS — J45901 Unspecified asthma with (acute) exacerbation: Secondary | ICD-10-CM | POA: Diagnosis present

## 2017-04-11 DIAGNOSIS — Z885 Allergy status to narcotic agent status: Secondary | ICD-10-CM | POA: Diagnosis not present

## 2017-04-11 DIAGNOSIS — C7951 Secondary malignant neoplasm of bone: Secondary | ICD-10-CM | POA: Diagnosis not present

## 2017-04-11 DIAGNOSIS — Z801 Family history of malignant neoplasm of trachea, bronchus and lung: Secondary | ICD-10-CM | POA: Diagnosis not present

## 2017-04-11 DIAGNOSIS — J9811 Atelectasis: Secondary | ICD-10-CM | POA: Diagnosis present

## 2017-04-11 DIAGNOSIS — I2699 Other pulmonary embolism without acute cor pulmonale: Secondary | ICD-10-CM | POA: Diagnosis present

## 2017-04-11 DIAGNOSIS — R0902 Hypoxemia: Secondary | ICD-10-CM

## 2017-04-11 DIAGNOSIS — Z803 Family history of malignant neoplasm of breast: Secondary | ICD-10-CM

## 2017-04-11 DIAGNOSIS — Z79899 Other long term (current) drug therapy: Secondary | ICD-10-CM

## 2017-04-11 DIAGNOSIS — Z91041 Radiographic dye allergy status: Secondary | ICD-10-CM

## 2017-04-11 DIAGNOSIS — C7802 Secondary malignant neoplasm of left lung: Secondary | ICD-10-CM | POA: Diagnosis not present

## 2017-04-11 DIAGNOSIS — I509 Heart failure, unspecified: Secondary | ICD-10-CM | POA: Diagnosis not present

## 2017-04-11 DIAGNOSIS — K219 Gastro-esophageal reflux disease without esophagitis: Secondary | ICD-10-CM | POA: Diagnosis present

## 2017-04-11 DIAGNOSIS — Z888 Allergy status to other drugs, medicaments and biological substances status: Secondary | ICD-10-CM

## 2017-04-11 DIAGNOSIS — M1991 Primary osteoarthritis, unspecified site: Secondary | ICD-10-CM | POA: Diagnosis present

## 2017-04-11 DIAGNOSIS — G893 Neoplasm related pain (acute) (chronic): Secondary | ICD-10-CM | POA: Diagnosis not present

## 2017-04-11 DIAGNOSIS — R0600 Dyspnea, unspecified: Secondary | ICD-10-CM | POA: Diagnosis present

## 2017-04-11 DIAGNOSIS — Z808 Family history of malignant neoplasm of other organs or systems: Secondary | ICD-10-CM

## 2017-04-11 DIAGNOSIS — C7801 Secondary malignant neoplasm of right lung: Secondary | ICD-10-CM | POA: Diagnosis not present

## 2017-04-11 DIAGNOSIS — R570 Cardiogenic shock: Secondary | ICD-10-CM | POA: Diagnosis not present

## 2017-04-11 DIAGNOSIS — R079 Chest pain, unspecified: Secondary | ICD-10-CM | POA: Diagnosis not present

## 2017-04-11 DIAGNOSIS — I4891 Unspecified atrial fibrillation: Secondary | ICD-10-CM | POA: Diagnosis present

## 2017-04-11 DIAGNOSIS — R0603 Acute respiratory distress: Secondary | ICD-10-CM | POA: Diagnosis not present

## 2017-04-11 DIAGNOSIS — J9690 Respiratory failure, unspecified, unspecified whether with hypoxia or hypercapnia: Secondary | ICD-10-CM | POA: Diagnosis not present

## 2017-04-11 DIAGNOSIS — I420 Dilated cardiomyopathy: Secondary | ICD-10-CM | POA: Diagnosis present

## 2017-04-11 DIAGNOSIS — C771 Secondary and unspecified malignant neoplasm of intrathoracic lymph nodes: Secondary | ICD-10-CM | POA: Diagnosis not present

## 2017-04-11 DIAGNOSIS — I481 Persistent atrial fibrillation: Secondary | ICD-10-CM | POA: Diagnosis not present

## 2017-04-11 DIAGNOSIS — J40 Bronchitis, not specified as acute or chronic: Secondary | ICD-10-CM | POA: Diagnosis present

## 2017-04-11 DIAGNOSIS — Z7189 Other specified counseling: Secondary | ICD-10-CM | POA: Diagnosis not present

## 2017-04-11 DIAGNOSIS — J9600 Acute respiratory failure, unspecified whether with hypoxia or hypercapnia: Secondary | ICD-10-CM | POA: Diagnosis present

## 2017-04-11 DIAGNOSIS — D72829 Elevated white blood cell count, unspecified: Secondary | ICD-10-CM

## 2017-04-11 DIAGNOSIS — C50919 Malignant neoplasm of unspecified site of unspecified female breast: Secondary | ICD-10-CM | POA: Diagnosis not present

## 2017-04-11 DIAGNOSIS — E876 Hypokalemia: Secondary | ICD-10-CM | POA: Diagnosis not present

## 2017-04-11 DIAGNOSIS — I5043 Acute on chronic combined systolic (congestive) and diastolic (congestive) heart failure: Secondary | ICD-10-CM

## 2017-04-11 DIAGNOSIS — I429 Cardiomyopathy, unspecified: Secondary | ICD-10-CM | POA: Diagnosis not present

## 2017-04-11 LAB — COMPREHENSIVE METABOLIC PANEL
ALK PHOS: 112 U/L (ref 38–126)
ALT: 56 U/L — AB (ref 14–54)
AST: 48 U/L — ABNORMAL HIGH (ref 15–41)
Albumin: 2.8 g/dL — ABNORMAL LOW (ref 3.5–5.0)
Anion gap: 9 (ref 5–15)
BUN: 12 mg/dL (ref 6–20)
CALCIUM: 9.3 mg/dL (ref 8.9–10.3)
CO2: 25 mmol/L (ref 22–32)
CREATININE: 0.82 mg/dL (ref 0.44–1.00)
Chloride: 104 mmol/L (ref 101–111)
GFR calc non Af Amer: >60 mL/min (ref >60)
GLUCOSE: 107 mg/dL — AB (ref 65–99)
Potassium: 3.3 mmol/L — ABNORMAL LOW (ref 3.5–5.1)
SODIUM: 138 mmol/L (ref 135–145)
Total Bilirubin: 0.6 mg/dL (ref 0.3–1.2)
Total Protein: 6.8 g/dL (ref 6.5–8.1)

## 2017-04-11 LAB — CBC WITH DIFFERENTIAL/PLATELET
Basophils Absolute: 0 10*3/uL (ref 0.0–0.1)
Basophils Relative: 0 %
EOS ABS: 0 10*3/uL (ref 0.0–0.7)
EOS PCT: 0 %
HCT: 33.8 % — ABNORMAL LOW (ref 36.0–46.0)
Hemoglobin: 10.7 g/dL — ABNORMAL LOW (ref 12.0–15.0)
LYMPHS ABS: 2.1 10*3/uL (ref 0.7–4.0)
Lymphocytes Relative: 27 %
MCH: 27.9 pg (ref 26.0–34.0)
MCHC: 31.7 g/dL (ref 30.0–36.0)
MCV: 88 fL (ref 78.0–100.0)
MONOS PCT: 7 %
Monocytes Absolute: 0.5 10*3/uL (ref 0.1–1.0)
Neutro Abs: 4.9 10*3/uL (ref 1.7–7.7)
Neutrophils Relative %: 66 %
PLATELETS: 557 10*3/uL — AB (ref 150–400)
RBC: 3.84 MIL/uL — ABNORMAL LOW (ref 3.87–5.11)
RDW: 19 % — ABNORMAL HIGH (ref 11.5–15.5)
WBC: 7.5 10*3/uL (ref 4.0–10.5)

## 2017-04-11 LAB — URINALYSIS, ROUTINE W REFLEX MICROSCOPIC
Bilirubin Urine: NEGATIVE
Glucose, UA: NEGATIVE mg/dL
Ketones, ur: NEGATIVE mg/dL
Leukocytes, UA: NEGATIVE
Nitrite: NEGATIVE
PROTEIN: NEGATIVE mg/dL
Specific Gravity, Urine: 1.006 (ref 1.005–1.030)
WBC, UA: NONE SEEN WBC/hpf (ref 0–5)
pH: 5 (ref 5.0–8.0)

## 2017-04-11 LAB — I-STAT CG4 LACTIC ACID, ED
LACTIC ACID, VENOUS: 0.93 mmol/L (ref 0.5–1.9)
Lactic Acid, Venous: 1.69 mmol/L (ref 0.5–1.9)

## 2017-04-11 LAB — I-STAT TROPONIN, ED: TROPONIN I, POC: 0.01 ng/mL (ref 0.00–0.08)

## 2017-04-11 LAB — PROTIME-INR
INR: 0.98
PROTHROMBIN TIME: 13 s (ref 11.4–15.2)

## 2017-04-11 LAB — BRAIN NATRIURETIC PEPTIDE: B NATRIURETIC PEPTIDE 5: 225.9 pg/mL — AB (ref 0.0–100.0)

## 2017-04-11 LAB — MRSA PCR SCREENING: MRSA BY PCR: NEGATIVE

## 2017-04-11 MED ORDER — ONDANSETRON HCL 4 MG PO TABS: 4.0000 mg | ORAL_TABLET | Freq: Four times a day (QID) | ORAL | Status: DC | PRN

## 2017-04-11 MED ORDER — ALBUTEROL SULFATE (2.5 MG/3ML) 0.083% IN NEBU
2.5000 mg | INHALATION_SOLUTION | Freq: Four times a day (QID) | RESPIRATORY_TRACT | Status: DC | PRN
  Administered 2017-04-14: 2.5 mg via RESPIRATORY_TRACT
  Filled 2017-04-11: qty 3

## 2017-04-11 MED ORDER — LEVALBUTEROL HCL 1.25 MG/0.5ML IN NEBU
1.2500 mg | INHALATION_SOLUTION | Freq: Four times a day (QID) | RESPIRATORY_TRACT | Status: DC
  Administered 2017-04-11: 1.25 mg via RESPIRATORY_TRACT

## 2017-04-11 MED ORDER — MORPHINE SULFATE (PF) 4 MG/ML IV SOLN
4.0000 mg | INTRAVENOUS | Status: DC | PRN
  Administered 2017-04-11: 4 mg via INTRAVENOUS
  Filled 2017-04-11: qty 1

## 2017-04-11 MED ORDER — FERROUS SULFATE 325 (65 FE) MG PO TABS
325.0000 mg | ORAL_TABLET | Freq: Three times a day (TID) | ORAL | Status: DC
  Administered 2017-04-12 – 2017-04-27 (×33): 325 mg via ORAL
  Filled 2017-04-11 (×39): qty 1

## 2017-04-11 MED ORDER — MORPHINE SULFATE (PF) 2 MG/ML IV SOLN
2.0000 mg | INTRAVENOUS | Status: AC | PRN
  Administered 2017-04-11: 2 mg via INTRAVENOUS
  Filled 2017-04-11: qty 1

## 2017-04-11 MED ORDER — ACETAMINOPHEN 325 MG PO TABS
650.0000 mg | ORAL_TABLET | Freq: Four times a day (QID) | ORAL | Status: DC | PRN
  Administered 2017-04-17: 650 mg via ORAL
  Filled 2017-04-11: qty 2

## 2017-04-11 MED ORDER — METHYLPREDNISOLONE SODIUM SUCC 125 MG IJ SOLR
125.0000 mg | Freq: Once | INTRAMUSCULAR | Status: AC
  Administered 2017-04-11: 125 mg via INTRAVENOUS
  Filled 2017-04-11: qty 2

## 2017-04-11 MED ORDER — DEXTROSE 5 % IV SOLN
1.0000 g | INTRAVENOUS | Status: DC
  Administered 2017-04-11 – 2017-04-12 (×2): 1 g via INTRAVENOUS
  Filled 2017-04-11 (×3): qty 10

## 2017-04-11 MED ORDER — ACETAMINOPHEN 650 MG RE SUPP: 650.0000 mg | Freq: Four times a day (QID) | RECTAL | Status: DC | PRN

## 2017-04-11 MED ORDER — METHYLPREDNISOLONE SODIUM SUCC 125 MG IJ SOLR
60.0000 mg | Freq: Four times a day (QID) | INTRAMUSCULAR | Status: DC
  Administered 2017-04-11 – 2017-04-13 (×6): 60 mg via INTRAVENOUS
  Filled 2017-04-11 (×6): qty 2

## 2017-04-11 MED ORDER — FUROSEMIDE 10 MG/ML IJ SOLN
40.0000 mg | Freq: Two times a day (BID) | INTRAMUSCULAR | Status: AC
  Administered 2017-04-11 – 2017-04-12 (×2): 40 mg via INTRAVENOUS
  Filled 2017-04-11 (×2): qty 4

## 2017-04-11 MED ORDER — AMIODARONE HCL 200 MG PO TABS
200.0000 mg | ORAL_TABLET | Freq: Two times a day (BID) | ORAL | Status: DC
  Administered 2017-04-11 – 2017-04-17 (×12): 200 mg via ORAL
  Filled 2017-04-11 (×12): qty 1

## 2017-04-11 MED ORDER — METOPROLOL SUCCINATE ER 50 MG PO TB24
50.0000 mg | ORAL_TABLET | Freq: Every day | ORAL | Status: DC
  Administered 2017-04-12 – 2017-04-15 (×4): 50 mg via ORAL
  Filled 2017-04-11 (×5): qty 1

## 2017-04-11 MED ORDER — ENOXAPARIN SODIUM 100 MG/ML ~~LOC~~ SOLN
1.0000 mg/kg | Freq: Two times a day (BID) | SUBCUTANEOUS | Status: DC
  Administered 2017-04-11 – 2017-04-27 (×32): 100 mg via SUBCUTANEOUS
  Filled 2017-04-11 (×33): qty 1

## 2017-04-11 MED ORDER — ONDANSETRON HCL 4 MG/2ML IJ SOLN
4.0000 mg | Freq: Four times a day (QID) | INTRAMUSCULAR | Status: DC | PRN
  Administered 2017-04-14 – 2017-04-19 (×3): 4 mg via INTRAVENOUS
  Filled 2017-04-11 (×3): qty 2

## 2017-04-11 MED ORDER — MORPHINE SULFATE 15 MG PO TABS
30.0000 mg | ORAL_TABLET | ORAL | Status: DC | PRN
  Administered 2017-04-12 – 2017-04-17 (×11): 30 mg via ORAL
  Filled 2017-04-11 (×12): qty 2

## 2017-04-11 MED ORDER — FENTANYL 12 MCG/HR TD PT72
12.5000 ug | MEDICATED_PATCH | TRANSDERMAL | Status: DC
  Administered 2017-04-12: 12.5 ug via TRANSDERMAL
  Filled 2017-04-11: qty 1

## 2017-04-11 MED ORDER — MELATONIN 10 MG PO TABS: 10.0000 mg | ORAL_TABLET | Freq: Every day | ORAL | Status: DC

## 2017-04-11 MED ORDER — SENNA 8.6 MG PO TABS
1.0000 | ORAL_TABLET | Freq: Every day | ORAL | Status: DC | PRN
  Administered 2017-04-15: 8.6 mg via ORAL
  Filled 2017-04-11 (×2): qty 1

## 2017-04-11 MED ORDER — LEVALBUTEROL HCL 1.25 MG/0.5ML IN NEBU
1.2500 mg | INHALATION_SOLUTION | Freq: Once | RESPIRATORY_TRACT | Status: AC
  Administered 2017-04-11: 1.25 mg via RESPIRATORY_TRACT
  Filled 2017-04-11: qty 0.5

## 2017-04-11 MED ORDER — AZITHROMYCIN 250 MG PO TABS
500.0000 mg | ORAL_TABLET | Freq: Every day | ORAL | Status: DC
  Administered 2017-04-11 – 2017-04-13 (×3): 500 mg via ORAL
  Filled 2017-04-11 (×3): qty 2

## 2017-04-11 MED ORDER — LEVALBUTEROL HCL 1.25 MG/0.5ML IN NEBU
INHALATION_SOLUTION | RESPIRATORY_TRACT | Status: AC
  Filled 2017-04-11: qty 0.5

## 2017-04-11 NOTE — ED Triage Notes (Signed)
Pt states that she has been SOB x 2 days. Breast CA. Last chemo 8/2. Alert and oriented.

## 2017-04-11 NOTE — ED Provider Notes (Signed)
[place] DEPT Provider Note   CSN: [id number] Arrival date & time: 04/11/17  1356     History   Chief Complaint Chief Complaint  Patient presents with  . Shortness of Breath    HPI Cindy Bradshaw is a 46 y.o. female.  The history is provided by the patient.  Shortness of Breath  This is a recurrent problem. The problem occurs continuously.The current episode started yesterday. The problem has been gradually worsening. Associated symptoms include a fever (low grade), cough and chest pain (pressure). Pertinent negatives include no rhinorrhea and no sputum production. It is unknown what precipitated the problem. She has tried nothing for the symptoms. She has had prior hospitalizations. Associated medical issues include asthma.     46 year old female who presents with one day of dyspnea. She has a history of metastatic breast cancer receiving chemotherapy. Also history of recent diagnosis of PE on Lovenox, nonischemic cardiomyopathy with EF of 25-30%, and paroxysmal atrial fibrillation. Reports 1 day of shortness of breath and low-grade fevers with cough. No sputum production. Associated with chest pressure. No lower extremity edema. She was seen earlier this month and her oncologist office, was noted to have low oxygenation. She was started on 2 L home oxygen. Prior to this was also recently in [place] prior to admission for pneumonia. She had calf tenderness at that time was diagnosed with a mass related to her metastatic breast cancer.   Past Medical History:  Diagnosis Date  . Arthritis   . Asthma   . Back disorder    degenerative disk disease  . Breast cancer ([place])   . Cancer ([place])   . Cardiomyopathy ([place])    a. Dx 02/2017 - EF 25-30%, felt possibly due to adriamycin +/- tachy mediated from atrial fib/flutter.  Marland Kitchen GERD (gastroesophageal reflux disease)   . Mammogram abnormal 08/24/15   first  . Migraine   . Normal coronary arteries    01/2016 cath  . PAF (paroxysmal  atrial fibrillation) ([place])   . Pap smear for cervical cancer screening 08/12/15  . Paroxysmal atrial flutter [facility])     Patient Active Problem List   Diagnosis Date Noted  . Hypoxia 04/04/2017  . Dyspnea 02/23/2017  . Acute pulmonary embolism ([place]) 02/23/2017  . Cardiomyopathy ([place])   . A-fib ([place]) 02/13/2017  . Metastatic breast cancer ([place])   . DCM (dilated cardiomyopathy) ([place])   . Atrial fibrillation with RVR ([place]) 02/11/2017  . Chest pain   . Adnexal cyst: Right per CT 12/09/2015 12/12/2015  . Chemotherapy-induced peripheral neuropathy ([place]) 12/05/2015  . Normocytic normochromic anemia 09/19/2015  . Breast cancer of upper-outer quadrant of left female breast ([place]) 09/07/2015    Past Surgical History:  Procedure Laterality Date  . BREAST LUMPECTOMY WITH RADIOACTIVE SEED AND SENTINEL LYMPH NODE BIOPSY Left 02/13/2016   Procedure: BREAST LUMPECTOMY WITH RADIOACTIVE SEED AND SENTINEL LYMPH NODE BIOPSY;  Surgeon: Excell Seltzer, MD;  Location: [place];  Service: General;  Laterality: Left;  . BREAST REDUCTION SURGERY Bilateral 02/21/2016   Procedure: MAMMARY REDUCTION  (BREAST)/Oncoplastic breast reconstruction;  Surgeon: Irene Limbo, MD;  Location: [place];  Service: Plastics;  Laterality: Bilateral;  . CARDIAC CATHETERIZATION [name] 01/24/2016   Procedure: Left Heart Cath and Coronary Angiography;  Surgeon: Leonie Man, MD;  Location: [place] CV LAB;  Service: Cardiovascular;  Laterality: [name];  . CESAREAN SECTION    . CHOLECYSTECTOMY    . IR FLUORO GUIDE CV LINE RIGHT  02/11/2017  . IR FLUORO GUIDE PORT INSERTION RIGHT  03/19/2017  . IR US GUIDE VASC ACCESS RIGHT  02/11/2017  . IR US GUIDE VASC ACCESS RIGHT  03/19/2017  . PERIPHERAL VASCULAR CATHETERIZATION Right 02/13/2016   Procedure: PORTA CATH REMOVAL;  Surgeon: Excell Seltzer, MD;  Location: [facility];  Service: General;  Laterality: Right;  . PORTACATH PLACEMENT [name]  09/12/2015   Procedure: INSERTION PORT-A-CATH;  Surgeon: Excell Seltzer, MD;  Location: [facility];  Service: General;  Laterality: [name];    OB History    Gravida Para Term Preterm AB Living   0 0 0 0 0     SAB TAB Ectopic Multiple Live Births   0 0 0           Home Medications    Prior to Admission medications   Medication Sig Start Date End Date Taking? Authorizing Provider  albuterol (PROVENTIL HFA;VENTOLIN HFA) 108 (90 Base) MCG/ACT inhaler Inhale 1-2 puffs into the lungs every 6 (six) hours as needed for wheezing or shortness of breath. 07/24/16  Yes Waynetta Pean, PA-C  albuterol (PROVENTIL) (2.5 MG/3ML) 0.083% nebulizer solution Take 2.5 mg by nebulization every 6 (six) hours as needed for wheezing or shortness of breath.   Yes [provider]  amiodarone (PACERONE) 200 MG tablet Take 1 tablet (200 mg total) by mouth 2 (two) times daily. Take 400 mg by mouth twice daily for 7 days, then take 200 mg by mouth twice daily for 14 days, then take 200 mg by mouth daily Patient taking differently: Take 200 mg by mouth 2 (two) times daily.  03/07/17  Yes Nicholas Lose, MD  cholecalciferol (VITAMIN D) 1000 units tablet Take 1,000 Units by mouth daily.   Yes [provider]  dexamethasone (DECADRON) 4 MG tablet Take 0.5 tablets (2 mg total) by mouth daily. 03/07/17  Yes Nicholas Lose, MD  enoxaparin (LOVENOX) 100 MG/ML injection Inject 1 mL (100 mg total) into the skin every 12 (twelve) hours. 02/26/17  Yes Hosie Poisson, MD  fentaNYL (DURAGESIC - DOSED MCG/HR) 12 MCG/HR Place 1 patch (12.5 mcg total) onto the skin every 3 (three) days. 03/07/17  Yes Nicholas Lose, MD  ferrous sulfate 325 (65 FE) MG EC tablet Take 325 mg by mouth 3 (three) times daily with meals.   Yes [provider]  furosemide (LASIX) 40 MG tablet Take 1 tablet (40 mg total) by mouth every other day. 03/05/17  Yes Arbutus Leas, NP  guaiFENesin-dextromethorphan (ROBITUSSIN DM) 100-10 MG/5ML syrup Take 5 mLs  by mouth every 4 (four) hours as needed for cough. 02/26/17  Yes Hosie Poisson, MD  losartan (COZAAR) 50 MG tablet Take 1 tablet (50 mg total) by mouth daily. 03/05/17  Yes Arbutus Leas, NP  Melatonin 10 MG TABS Take 10 mg by mouth at bedtime.    Yes [provider]  metoprolol succinate (TOPROL-XL) 50 MG 24 hr tablet Take 1 tablet (50 mg total) by mouth daily. Take with or immediately following a meal. 03/25/17  Yes Bensimhon, Shaune Pascal, MD  morphine (MSIR) 30 MG tablet Take 1 tablet (30 mg total) by mouth every 4 (four) hours as needed for severe pain. 03/07/17  Yes Nicholas Lose, MD  ondansetron (ZOFRAN) 8 MG tablet Take 1 tablet (8 mg total) by mouth 2 (two) times daily as needed (Nausea or vomiting). 03/07/17  Yes Nicholas Lose, MD  potassium chloride SA (K-DUR,KLOR-CON) 20 MEQ tablet Take 1 tablet (20 mEq total) by mouth  daily. 03/07/17  Yes Nicholas Lose, MD  senna (SENOKOT) 8.6 MG TABS tablet Take 1 tablet by mouth daily as needed for mild constipation.   Yes [provider]  tobramycin-dexamethasone (TOBRADEX) ophthalmic solution Place 2 drops into both eyes every 4 (four) hours while awake. Patient not taking: Reported on 04/11/2017 02/18/17   Gardenia Phlegm, NP    Family History Family History  Problem Relation Age of Onset  . Lung cancer Mother 2       2 different types of lung cancer; metastasis to brain; smoker  . Other Mother        hx of hysterectomy for unspecified reason  . Bone cancer Maternal Uncle        dx. early 54s  . Breast cancer Maternal Grandmother        dx. early 57s, s/p mastectomy  . Cancer Paternal Grandfather        unspecified type of cancer, dx. late 51s  . Congestive Heart Failure Father        smoker  . Stroke Father   . Eczema Father   . Cirrhosis Maternal Grandfather   . Heart Problems Maternal Grandfather   . Asthma Daughter   . Allergies Daughter        hives  . Lung cancer Other        (maternal great uncle; MGM's brother);  had a coal stove  . Cancer Cousin        unspecified type; d. early age (paternal first cousin once-removed)  . Cancer Cousin        dx. as a kid; in remission today; (paternal 2nd cousin)  . Cancer Cousin        unspecified type; d. early 40s; (maternal 1st cousin)    Social History Social History  Substance Use Topics  . Smoking status: Never Smoker  . Smokeless tobacco: Never Used  . Alcohol use 0.0 oz/week     Comment: occassional glass of wine     Allergies   Dilaudid [hydromorphone hcl]; Ivp dye [iodinated diagnostic agents]; Percocet [oxycodone-acetaminophen]; Tape; and Toradol [ketorolac tromethamine]   Review of Systems Review of Systems  Constitutional: Positive for fever (low grade).  HENT: Negative for rhinorrhea.   Respiratory: Positive for cough and shortness of breath. Negative for sputum production.   Cardiovascular: Positive for chest pain (pressure).  All other systems reviewed and are negative.    Physical Exam Updated Vital Signs BP 126/85 (BP Location: Right Arm)   Pulse 92   Temp 99 F (37.2 C) (Oral)   Resp 20   SpO2 98%   Physical Exam Physical Exam  Nursing note and vitals reviewed. Constitutional: Chronically ill appearing, appears in moderate respiratory distress Head: Normocephalic and atraumatic.  Mouth/Throat: Oropharynx is clear and moist.  Neck: Normal range of motion. Neck supple.  Cardiovascular: Normal rate and regular rhythm.   Pulmonary/Chest: Effort normal and breath sounds normal.  Abdominal: Soft. There is no tenderness. There is no rebound and no guarding.  Musculoskeletal: Normal range of motion.  Neurological: Alert, no facial droop, fluent speech, moves all extremities symmetrically Skin: Skin is warm and dry.  Psychiatric: Cooperative   ED Treatments / Results  Labs (all labs ordered are listed, but only abnormal results are displayed) Labs Reviewed  COMPREHENSIVE METABOLIC PANEL - Abnormal; Notable for the  following:       Result Value   Potassium 3.3 (*)    Glucose, Bld 107 (*)    Albumin 2.8 (*)  AST 48 (*)    ALT 56 (*)    All other components within normal limits  CBC WITH DIFFERENTIAL/PLATELET - Abnormal; Notable for the following:    RBC 3.84 (*)    Hemoglobin 10.7 (*)    HCT 33.8 (*)    RDW 19.0 (*)    Platelets 557 (*)    All other components within normal limits  BRAIN NATRIURETIC PEPTIDE - Abnormal; Notable for the following:    B Natriuretic Peptide 225.9 (*)    All other components within normal limits  CULTURE, BLOOD (ROUTINE X 2)  CULTURE, BLOOD (ROUTINE X 2)  PROTIME-INR  URINALYSIS, ROUTINE W REFLEX MICROSCOPIC  I-STAT CG4 LACTIC ACID, ED  I-STAT TROPONIN, ED  I-STAT CG4 LACTIC ACID, ED    EKG  EKG Interpretation None       Radiology Dg Chest Portable 1 View  Result Date: 04/11/2017 CLINICAL DATA:  Shortness of breath for 2 days with cough, history breast cancer post chemotherapy, history asthma, cardiomyopathy, atrial fibrillation, LEFT lumpectomy with radioactive seed implantation and sentinel node biopsy EXAM: PORTABLE CHEST 1 VIEW COMPARISON:  Portable exam 1428 hours compared to 02/22/2017 FINDINGS: RIGHT jugular Port-A-Cath with tip projecting over SVC. Enlargement of cardiac silhouette. Prominent mediastinum, hila, and AP window identified, corresponding to adenopathy on CT. Multiple pulmonary masses again identified compatible with metastases. Bibasilar atelectasis. No gross pleural effusion or pneumothorax. Osseous structures unremarkable. IMPRESSION: Pulmonary metastases. Mediastinal and hilar adenopathy. Bibasilar atelectasis. Electronically Signed   By: Lavonia Jaonna Word M.D.   On: 04/11/2017 14:47    Procedures Procedures (including critical care time)  Medications Ordered in ED Medications  morphine 4 MG/ML injection 4 mg (4 mg Intravenous Given 04/11/17 1612)  levalbuterol (XOPENEX) nebulizer solution 1.25 mg (1.25 mg Nebulization Given 04/11/17 1610)   methylPREDNISolone sodium succinate (SOLU-MEDROL) 125 mg/2 mL injection 125 mg (125 mg Intravenous Given 04/11/17 1607)     Initial Impression / Assessment and Plan / ED Course  I have reviewed the triage vital signs and the nursing notes.  Pertinent labs & imaging results that were available during my care of the patient were reviewed by me and considered in my medical decision making (see chart for details).     46 year old female who presents with shortness of breath. This is likely multifactorial. Records reviewed CTA shows progression of her lung metastases. Also undergoing treatment for PE and has been compliant with Lovenox so I feel like worsening PE burden is less likely. With bronchospastic cough and wheezing on exam, and may be component of asthma exacerbation. Given Xopenex and steroids. Also with severe heart failure and EF of 25%. No obvious pulmonary edema or interstitial edema on chest x-ray. There is significant pulmonary metastases on chest x-ray. Question potential CT chest, but she has IV contrast. Question is she needs CT w/o contrast for further evaluation.  Discussed with Dr. Broadus John from hospitalist service. She will evaluate patient and admit for ongoing treatment and work-up.   Final Clinical Impressions(s) / ED Diagnoses   Final diagnoses:  Acute respiratory failure with hypoxia (HCC)  Shortness of breath    New Prescriptions New Prescriptions   No medications on file     Forde Dandy, MD 04/11/17 [phone]

## 2017-04-11 NOTE — H&P (Addendum)
History and Physical    Cindy Bradshaw SPQ:[id number] DOB: 1971-03-09 DOA: 04/11/2017  Referring MD/NP/PA: EDP PCP:  Patient coming from: Home  Chief Complaint: shortness of breath  HPI: Cindy Bradshaw is a 46 y.o. female with medical history significant of metastatic breast cancer with extensive pulmonary metastases, H/o PE on full dose lovenox, nonischemic cardiomyopathy ejection fraction of 25%, paroxysmal atrial fibrillation, asthma, presented to the ED with worsening shortness of breath, pt reports that this is gradually progressive over 1-2weeks, she was just started on O2 at 2L at her last visit with [name] and given reduced dose Halaven(chemo). She reports compliance with SQ lovenox and her lasix. She also reports low grade fevers with cough for last 1-2days, temperature upto 100.9 today at home and some wheezing.  ED Course: Mildly tachypneic, BNP 225, lactate and troponin negative, CXR notable for bilateral atelectasis, adenopathy and pulmonary metastasis  Review of Systems: As per HPI otherwise 14 point review of systems negative.   Past Medical History:  Diagnosis Date  . Arthritis   . Asthma   . Back disorder    degenerative disk disease  . Breast cancer ([place])   . Cancer ([place])   . Cardiomyopathy ([place])    a. Dx 02/2017 - EF 25-30%, felt possibly due to adriamycin +/- tachy mediated from atrial fib/flutter.  Marland Kitchen GERD (gastroesophageal reflux disease)   . Mammogram abnormal 08/24/15   first  . Migraine   . Normal coronary arteries    01/2016 cath  . PAF (paroxysmal atrial fibrillation) ([place])   . Pap smear for cervical cancer screening 08/12/15  . Paroxysmal atrial flutter [facility])     Past Surgical History:  Procedure Laterality Date  . BREAST LUMPECTOMY WITH RADIOACTIVE SEED AND SENTINEL LYMPH NODE BIOPSY Left 02/13/2016   Procedure: BREAST LUMPECTOMY WITH RADIOACTIVE SEED AND SENTINEL LYMPH NODE BIOPSY;  Surgeon: Excell Seltzer, MD;  Location: [place];  Service: General;  Laterality: Left;  . BREAST REDUCTION SURGERY Bilateral 02/21/2016   Procedure: MAMMARY REDUCTION  (BREAST)/Oncoplastic breast reconstruction;  Surgeon: Irene Limbo, MD;  Location: [place];  Service: Plastics;  Laterality: Bilateral;  . CARDIAC CATHETERIZATION [name] 01/24/2016   Procedure: Left Heart Cath and Coronary Angiography;  Surgeon: Leonie Man, MD;  Location: [place] [facility];  Service: Cardiovascular;  Laterality: [name];  . CESAREAN SECTION    . CHOLECYSTECTOMY    . IR FLUORO GUIDE CV LINE RIGHT  02/11/2017  . IR FLUORO GUIDE PORT INSERTION RIGHT  03/19/2017  . IR US GUIDE VASC ACCESS RIGHT  02/11/2017  . IR US GUIDE VASC ACCESS RIGHT  03/19/2017  . PERIPHERAL VASCULAR CATHETERIZATION Right 02/13/2016   Procedure: PORTA CATH REMOVAL;  Surgeon: Excell Seltzer, MD;  Location: [place];  Service: General;  Laterality: Right;  . PORTACATH PLACEMENT [name] 09/12/2015   Procedure: INSERTION PORT-A-CATH;  Surgeon: Excell Seltzer, MD;  Location: [facility];  Service: General;  Laterality: [name];     reports that she has never smoked. She has never used smokeless tobacco. She reports that she drinks alcohol. She reports that she does not use drugs.  Allergies  Allergen Reactions  . Dilaudid [Hydromorphone Hcl] Itching  . Ivp Dye [Iodinated Diagnostic Agents] Itching  . Percocet [Oxycodone-Acetaminophen] Itching  . Tape Itching and Rash    Hypofix causes rash  . Toradol [Ketorolac Tromethamine] Itching    Family History  Problem Relation Age of Onset  . Lung cancer Mother 43  2 different types of lung cancer; metastasis to brain; smoker  . Other Mother        hx of hysterectomy for unspecified reason  . Bone cancer Maternal Uncle        dx. early 63s  . Breast cancer Maternal Grandmother        dx. early 45s, s/p mastectomy  . Cancer Paternal Grandfather        unspecified type of cancer, dx. late 4s  .  Congestive Heart Failure Father        smoker  . Stroke Father   . Eczema Father   . Cirrhosis Maternal Grandfather   . Heart Problems Maternal Grandfather   . Asthma Daughter   . Allergies Daughter        hives  . Lung cancer Other        (maternal great uncle; MGM's brother); had a coal stove  . Cancer Cousin        unspecified type; d. early age (paternal first cousin once-removed)  . Cancer Cousin        dx. as a kid; in remission today; (paternal 2nd cousin)  . Cancer Cousin        unspecified type; d. early 48s; (maternal 1st cousin)     Prior to Admission medications   Medication Sig Start Date End Date Taking? Authorizing Provider  albuterol (PROVENTIL HFA;VENTOLIN HFA) 108 (90 Base) MCG/ACT inhaler Inhale 1-2 puffs into the lungs every 6 (six) hours as needed for wheezing or shortness of breath. 07/24/16  Yes Waynetta Pean, PA-C  albuterol (PROVENTIL) (2.5 MG/3ML) 0.083% nebulizer solution Take 2.5 mg by nebulization every 6 (six) hours as needed for wheezing or shortness of breath.   Yes [provider]  amiodarone (PACERONE) 200 MG tablet Take 1 tablet (200 mg total) by mouth 2 (two) times daily. Take 400 mg by mouth twice daily for 7 days, then take 200 mg by mouth twice daily for 14 days, then take 200 mg by mouth daily Patient taking differently: Take 200 mg by mouth 2 (two) times daily.  03/07/17  Yes Nicholas Lose, MD  cholecalciferol (VITAMIN D) 1000 units tablet Take 1,000 Units by mouth daily.   Yes [provider]  dexamethasone (DECADRON) 4 MG tablet Take 0.5 tablets (2 mg total) by mouth daily. 03/07/17  Yes Nicholas Lose, MD  enoxaparin (LOVENOX) 100 MG/ML injection Inject 1 mL (100 mg total) into the skin every 12 (twelve) hours. 02/26/17  Yes Hosie Poisson, MD  fentaNYL (DURAGESIC - DOSED MCG/HR) 12 MCG/HR Place 1 patch (12.5 mcg total) onto the skin every 3 (three) days. 03/07/17  Yes Nicholas Lose, MD  ferrous sulfate 325 (65 FE) MG EC tablet Take  325 mg by mouth 3 (three) times daily with meals.   Yes [provider]  furosemide (LASIX) 40 MG tablet Take 1 tablet (40 mg total) by mouth every other day. 03/05/17  Yes Arbutus Leas, NP  guaiFENesin-dextromethorphan (ROBITUSSIN DM) 100-10 MG/5ML syrup Take 5 mLs by mouth every 4 (four) hours as needed for cough. 02/26/17  Yes Hosie Poisson, MD  losartan (COZAAR) 50 MG tablet Take 1 tablet (50 mg total) by mouth daily. 03/05/17  Yes Arbutus Leas, NP  Melatonin 10 MG TABS Take 10 mg by mouth at bedtime.    Yes [provider]  metoprolol succinate (TOPROL-XL) 50 MG 24 hr tablet Take 1 tablet (50 mg total) by mouth daily. Take with or immediately following a meal.  03/25/17  Yes Bensimhon, Shaune Pascal, MD  morphine (MSIR) 30 MG tablet Take 1 tablet (30 mg total) by mouth every 4 (four) hours as needed for severe pain. 03/07/17  Yes Nicholas Lose, MD  ondansetron (ZOFRAN) 8 MG tablet Take 1 tablet (8 mg total) by mouth 2 (two) times daily as needed (Nausea or vomiting). 03/07/17  Yes Nicholas Lose, MD  potassium chloride SA (K-DUR,KLOR-CON) 20 MEQ tablet Take 1 tablet (20 mEq total) by mouth daily. 03/07/17  Yes Nicholas Lose, MD  senna (SENOKOT) 8.6 MG TABS tablet Take 1 tablet by mouth daily as needed for mild constipation.   Yes [provider]  tobramycin-dexamethasone (TOBRADEX) ophthalmic solution Place 2 drops into both eyes every 4 (four) hours while awake. Patient not taking: Reported on 04/11/2017 02/18/17   Gardenia Phlegm, NP    Physical Exam: Vitals:   04/11/17 1451 04/11/17 1602 04/11/17 1630 04/11/17 1700  BP: 114/76 126/85 117/73 118/76  Pulse: (!) 104 92 91 95  Resp: 13 20 (!) 29 (!) 25  Temp:      TempSrc:      SpO2: 100% 98% 98% 95%       Vitals:   04/11/17 1451 04/11/17 1602 04/11/17 1630 04/11/17 1700  BP: 114/76 126/85 117/73 118/76  Pulse: (!) 104 92 91 95  Resp: 13 20 (!) 29 (!) 25  Temp:      TempSrc:      SpO2: 100% 98% 98% 95%    Constitutional: Obese African-American female chronically ill appearing, laying on the stretcher, uncomfortable Eyes: PERRL, lids and conjunctivae normal ENMT: Mucous membranes are moist.  Neck: normal, supple, Respiratory: Poor air movement bilaterally, few scattered expiratory wheezes  Cardiovascular: Regular rate and rhythm, no murmurs  Abdomen: no tenderness, no masses palpated. Bowel sounds positive.  Extremities: Deformity noted in the right calf, no edema Skin: no rashes, lesions, ulcers. No induration Neurologic: Moves all extremities, no localizing signs  Psychiatric: Flat affect  Labs on Admission: I have personally reviewed following labs and imaging studies  CBC:  Recent Labs Lab 04/11/17 1421  WBC 7.5  NEUTROABS 4.9  HGB 10.7*  HCT 33.8*  MCV 88.0  PLT 222*   Basic Metabolic Panel:  Recent Labs Lab 04/11/17 1421  NA 138  K 3.3*  CL 104  CO2 25  GLUCOSE 107*  BUN 12  CREATININE 0.82  CALCIUM 9.3   GFR: Estimated Creatinine Clearance: 101.2 mL/min (by C-G formula based on SCr of 0.82 mg/dL). Liver Function Tests:  Recent Labs Lab 04/11/17 1421  AST 48*  ALT 56*  ALKPHOS 112  BILITOT 0.6  PROT 6.8  ALBUMIN 2.8*   No results for input(s): LIPASE, AMYLASE in the last 168 hours. No results for input(s): AMMONIA in the last 168 hours. Coagulation Profile:  Recent Labs Lab 04/11/17 1421  INR 0.98   Cardiac Enzymes: No results for input(s): CKTOTAL, CKMB, CKMBINDEX, TROPONINI in the last 168 hours. BNP (last 3 results) No results for input(s): PROBNP in the last 8760 hours. HbA1C: No results for input(s): HGBA1C in the last 72 hours. CBG: No results for input(s): GLUCAP in the last 168 hours. Lipid Profile: No results for input(s): CHOL, HDL, LDLCALC, TRIG, CHOLHDL, LDLDIRECT in the last 72 hours. Thyroid Function Tests: No results for input(s): TSH, T4TOTAL, FREET4, T3FREE, THYROIDAB in the last 72 hours. Anemia Panel: No results  for input(s): VITAMINB12, FOLATE, FERRITIN, TIBC, IRON, RETICCTPCT in the last 72 hours. Urine analysis:  Component Value Date/Time   COLORURINE YELLOW 07/03/2016 0557   APPEARANCEUR CLOUDY (A) 07/03/2016 0557   LABSPEC 1.021 07/03/2016 0557   LABSPEC 1.010 04/10/2016 1224   PHURINE 6.0 07/03/2016 0557   GLUCOSEU NEGATIVE 07/03/2016 0557   GLUCOSEU Negative 04/10/2016 1224   HGBUR SMALL (A) 07/03/2016 0557   BILIRUBINUR NEGATIVE 07/03/2016 0557   BILIRUBINUR Negative 04/10/2016 [place] 07/03/2016 0557   PROTEINUR NEGATIVE 07/03/2016 0557   UROBILINOGEN 0.2 04/10/2016 1224   NITRITE NEGATIVE 07/03/2016 0557   LEUKOCYTESUR NEGATIVE 07/03/2016 0557   LEUKOCYTESUR Negative 04/10/2016 1224   Sepsis Labs: @LABRCNTIP (procalcitonin:4,lacticidven:4) ) Recent Results (from the past 240 hour(s))  TECHNOLOGIST REVIEW     Status: None   Collection Time: 04/04/17 10:59 AM  Result Value Ref Range Status   Technologist Review 4% myelo seen  Final     Radiological Exams on Admission: Dg Chest Portable 1 View  Result Date: 04/11/2017 CLINICAL DATA:  Shortness of breath for 2 days with cough, history breast cancer post chemotherapy, history asthma, cardiomyopathy, atrial fibrillation, LEFT lumpectomy with radioactive seed implantation and sentinel node biopsy EXAM: PORTABLE CHEST 1 VIEW COMPARISON:  Portable exam 1428 hours compared to 02/22/2017 FINDINGS: RIGHT jugular Port-A-Cath with tip projecting over SVC. Enlargement of cardiac silhouette. Prominent mediastinum, hila, and AP window identified, corresponding to adenopathy on CT. Multiple pulmonary masses again identified compatible with metastases. Bibasilar atelectasis. No gross pleural effusion or pneumothorax. Osseous structures unremarkable. IMPRESSION: Pulmonary metastases. Mediastinal and hilar adenopathy. Bibasilar atelectasis. Electronically Signed   By: Lavonia Dana M.D.   On: 04/11/2017 14:47    EKG:I have  independently reviewed this EKG, normal sinus rhythm no acute ST-T wave changes   Assessment/Plan  Acute hypoxic respiratory failure -Multifactorial, I suspect this is primarily secondary to progression of her pulmonary metastatic disease -However she also reports fever, productive cough and increased wheezing, hence bronchitis/mild asthma exacerbation could be contributing -We'll start her on IV antibiotics, nebs and IV Solu-Medrol -We will check a CTA chest, h/o IVP due allergy-itching, but she is on IV solumedrol now hence would not need Prednisone pre contrast -Clinically suspicion for acute PE is less since she is on full dose therapeutic anticoagulation and is adamantly reporting compliance  Nonischemic cardiomyopathy -EF 25% -Felt to be related to Adriamycin/chemotherapy -We'll continue Lasix and potassium will give her 2 doses by IV today and then resume by mouth -Clinically does not appear overtly volume overloaded  Metastatic breast cancer with extensive pulmonary metastases -On palliative chemotherapy by [name] -I have notified [name] of admission he will follow-up in a.m.  P.Afib -converted to NSR now -Continue oral amiodarone and metoprolol -Already on full dose anticoagulation for history of PE  Chronic pain -Related to metastatic cancer  -continue fentanyl and oxycodone per home regimen  DVT prophylaxis: on full dose Lovenox Code Status: patient wants to be a full code Family Communication: No family at bedside Disposition Plan: admit to SDU Consults : oncology [place] Admission status: inpatient   Domenic Polite MD [facility] Pager [phone]  If 7PM-7AM, please contact night-coverage [email or web address] Password [email or web address]  04/11/2017, 5:27 PM

## 2017-04-11 NOTE — Telephone Encounter (Signed)
12:36 This nurse retrieved initial voicemail message.  Patient slow to mouth words.  Raspy voice, loud respiratory effort a challenge to talk with call.  This nurse called patient at 12:39 instructing to call 911 to proceed to ED NOW for further evaluation.  Offered to call asking if she is able to call as soon as we end this call.  Slowly mouthed "okay I will call 911".  Message left for collaborative for provider review or any further communication with patient.       FYI 1021: "I am short of breath, have a severe headache, temperature = 99.9, please call." 1023: "I'm short of breath and also having pain."   1141: "I am calling for [name]'s nurse."  Did not disclose her name or phone number.  Slower to get words out out with each call.

## 2017-04-11 NOTE — Progress Notes (Signed)
PHARMACIST - PHYSICIAN ORDER COMMUNICATION  CONCERNING: P&T Medication Policy on Herbal Medications  DESCRIPTION:  This patient's order for:  Melatonin  has been noted.  This product(s) is classified as an "herbal" or natural product. Due to a lack of definitive safety studies or FDA approval, nonstandard manufacturing practices, plus the potential risk of unknown drug-drug interactions while on inpatient medications, the Pharmacy and Therapeutics Committee does not permit the use of "herbal" or natural products of this type within [facility].   ACTION TAKEN: The pharmacy department is unable to verify this order at this time and your patient has been informed of this safety policy. Please reevaluate patient's clinical condition at discharge and address if the herbal or natural product(s) should be resumed at that time.  Thank you,  Minda Ditto PharmD Pager [phone] 04/11/2017, 7:33 PM

## 2017-04-12 ENCOUNTER — Other Ambulatory Visit: Payer: Self-pay | Admitting: Hematology and Oncology

## 2017-04-12 ENCOUNTER — Inpatient Hospital Stay (HOSPITAL_COMMUNITY): Payer: Federal, State, Local not specified - PPO

## 2017-04-12 DIAGNOSIS — R740 Nonspecific elevation of levels of transaminase and lactic acid dehydrogenase [LDH]: Secondary | ICD-10-CM

## 2017-04-12 DIAGNOSIS — C50412 Malignant neoplasm of upper-outer quadrant of left female breast: Secondary | ICD-10-CM

## 2017-04-12 DIAGNOSIS — C7801 Secondary malignant neoplasm of right lung: Secondary | ICD-10-CM

## 2017-04-12 DIAGNOSIS — C7802 Secondary malignant neoplasm of left lung: Secondary | ICD-10-CM

## 2017-04-12 DIAGNOSIS — I2699 Other pulmonary embolism without acute cor pulmonale: Secondary | ICD-10-CM

## 2017-04-12 DIAGNOSIS — I509 Heart failure, unspecified: Secondary | ICD-10-CM

## 2017-04-12 DIAGNOSIS — C7951 Secondary malignant neoplasm of bone: Secondary | ICD-10-CM

## 2017-04-12 DIAGNOSIS — Z171 Estrogen receptor negative status [ER-]: Secondary | ICD-10-CM

## 2017-04-12 DIAGNOSIS — C771 Secondary and unspecified malignant neoplasm of intrathoracic lymph nodes: Secondary | ICD-10-CM

## 2017-04-12 LAB — COMPREHENSIVE METABOLIC PANEL
ALBUMIN: 2.6 g/dL — AB (ref 3.5–5.0)
ALT: 64 U/L — ABNORMAL HIGH (ref 14–54)
ANION GAP: 12 (ref 5–15)
AST: 50 U/L — ABNORMAL HIGH (ref 15–41)
Alkaline Phosphatase: 106 U/L (ref 38–126)
BILIRUBIN TOTAL: 0.3 mg/dL (ref 0.3–1.2)
BUN: 16 mg/dL (ref 6–20)
CO2: 25 mmol/L (ref 22–32)
Calcium: 9.6 mg/dL (ref 8.9–10.3)
Chloride: 100 mmol/L — ABNORMAL LOW (ref 101–111)
Creatinine, Ser: 0.82 mg/dL (ref 0.44–1.00)
GFR calc non Af Amer: >60 mL/min (ref >60)
GLUCOSE: 141 mg/dL — AB (ref 65–99)
POTASSIUM: 3.7 mmol/L (ref 3.5–5.1)
Sodium: 137 mmol/L (ref 135–145)
TOTAL PROTEIN: 6.8 g/dL (ref 6.5–8.1)

## 2017-04-12 LAB — CBC
HEMATOCRIT: 32.5 % — AB (ref 36.0–46.0)
Hemoglobin: 10.3 g/dL — ABNORMAL LOW (ref 12.0–15.0)
MCH: 27.5 pg (ref 26.0–34.0)
MCHC: 31.7 g/dL (ref 30.0–36.0)
MCV: 86.9 fL (ref 78.0–100.0)
Platelets: 558 10*3/uL — ABNORMAL HIGH (ref 150–400)
RBC: 3.74 MIL/uL — ABNORMAL LOW (ref 3.87–5.11)
RDW: 18.7 % — AB (ref 11.5–15.5)
WBC: 8.1 10*3/uL (ref 4.0–10.5)

## 2017-04-12 MED ORDER — POTASSIUM CHLORIDE CRYS ER 20 MEQ PO TBCR
40.0000 meq | EXTENDED_RELEASE_TABLET | Freq: Two times a day (BID) | ORAL | Status: AC
  Administered 2017-04-12 (×2): 40 meq via ORAL
  Filled 2017-04-12 (×2): qty 2

## 2017-04-12 MED ORDER — MORPHINE SULFATE 15 MG PO TABS
30.0000 mg | ORAL_TABLET | Freq: Once | ORAL | Status: AC
  Administered 2017-04-12: 30 mg via ORAL
  Filled 2017-04-12: qty 2

## 2017-04-12 MED ORDER — LEVALBUTEROL HCL 1.25 MG/0.5ML IN NEBU
1.2500 mg | INHALATION_SOLUTION | Freq: Four times a day (QID) | RESPIRATORY_TRACT | Status: DC
  Administered 2017-04-12 (×3): 1.25 mg via RESPIRATORY_TRACT
  Filled 2017-04-12 (×4): qty 0.5

## 2017-04-12 MED ORDER — IOPAMIDOL (ISOVUE-370) INJECTION 76%
INTRAVENOUS | Status: AC
  Administered 2017-04-12: 100 mL
  Filled 2017-04-12: qty 100

## 2017-04-12 MED ORDER — LEVALBUTEROL HCL 1.25 MG/0.5ML IN NEBU
1.2500 mg | INHALATION_SOLUTION | Freq: Three times a day (TID) | RESPIRATORY_TRACT | Status: DC
  Administered 2017-04-13 – 2017-04-14 (×4): 1.25 mg via RESPIRATORY_TRACT
  Filled 2017-04-12 (×4): qty 0.5

## 2017-04-12 MED ORDER — SODIUM CHLORIDE 0.9% FLUSH
10.0000 mL | Freq: Two times a day (BID) | INTRAVENOUS | Status: DC
  Administered 2017-04-17 – 2017-04-25 (×12): 10 mL

## 2017-04-12 MED ORDER — DIPHENHYDRAMINE HCL 50 MG/ML IJ SOLN
25.0000 mg | Freq: Four times a day (QID) | INTRAMUSCULAR | Status: DC | PRN
  Administered 2017-04-12: 25 mg via INTRAVENOUS
  Filled 2017-04-12: qty 1

## 2017-04-12 MED ORDER — FUROSEMIDE 10 MG/ML IJ SOLN
40.0000 mg | Freq: Two times a day (BID) | INTRAMUSCULAR | Status: AC
  Administered 2017-04-13: 40 mg via INTRAVENOUS
  Filled 2017-04-12: qty 4

## 2017-04-12 MED ORDER — CHLORHEXIDINE GLUCONATE CLOTH 2 % EX PADS
6.0000 | MEDICATED_PAD | Freq: Every day | CUTANEOUS | Status: DC
  Administered 2017-04-12 – 2017-04-13 (×2): 6 via TOPICAL

## 2017-04-12 MED ORDER — SODIUM CHLORIDE 0.9% FLUSH
10.0000 mL | INTRAVENOUS | Status: DC | PRN
  Administered 2017-04-17 – 2017-04-27 (×4): 10 mL
  Filled 2017-04-12 (×4): qty 40

## 2017-04-12 MED ORDER — METOPROLOL TARTRATE 5 MG/5ML IV SOLN
5.0000 mg | Freq: Once | INTRAVENOUS | Status: AC
  Administered 2017-04-13: 5 mg via INTRAVENOUS
  Filled 2017-04-12: qty 5

## 2017-04-12 NOTE — Progress Notes (Signed)
Patient SOB at this time. Frequent strong, non-productive cough. States she feels unable to lie flat for her CT scan at this time.

## 2017-04-12 NOTE — Progress Notes (Signed)
Patient has progressed on multiple lines of chemo We need to try immunotherapy with Cindy Bradshaw (for compassionate use) Plan to initiate it on 8/23

## 2017-04-12 NOTE — Progress Notes (Signed)
HEMATOLOGY-ONCOLOGY PROGRESS NOTE  SUBJECTIVE:Admitted with SOB and cough. Was in Afib and then converted to sinus rhythm. Sob better but cough is bad. Patient has been on several lines of chemo and her cancer has been continuously progressing. Shes not willing to give up and wants to keep trying.  She was recently admitted for pneumonia in [place].  OBJECTIVE: REVIEW OF SYSTEMS:   Constitutional: Denies fevers, chills or abnormal weight loss Eyes: Denies blurriness of vision Ears, nose, mouth, throat, and face: Denies mucositis or sore throat Respiratory: Shortness of breath and cough, O2 by [place] Cardiovascular: Denies palpitation, chest discomfort Gastrointestinal:  Denies nausea, heartburn or change in bowel habits Skin: Denies abnormal skin rashes Lymphatics: Denies new lymphadenopathy or easy bruising Neurological:Denies numbness, tingling or new weaknesses Behavioral/Psych: Mood is stable, no new changes  Extremities: No lower extremity edema All other systems were reviewed with the patient and are negative.  I have reviewed the past medical history, past surgical history, social history and family history with the patient and they are unchanged from previous note.   PHYSICAL EXAMINATION: ECOG PERFORMANCE STATUS: 3 - Symptomatic, >50% confined to bed  Vitals:   04/12/17 1200 04/12/17 1300  BP: 113/64 122/64  Pulse: 84 83  Resp: 20 (!) 21  Temp:    SpO2: 100% 99%   Filed Weights   04/12/17 0700  Weight: 207 lb 14.3 oz (94.3 kg)    GENERAL:alert, no distress and comfortable SKIN: skin color, texture, turgor are normal, no rashes or significant lesions EYES: normal, Conjunctiva are pink and non-injected, sclera clear OROPHARYNX:no exudate, no erythema and lips, buccal mucosa, and tongue normal  NECK: supple, thyroid normal size, non-tender, without nodularity LYMPH:  no palpable lymphadenopathy in the cervical, axillary or inguinal LUNGS:Dim BS at bases and Wheeze  and crackles at bases HEART: regular rate & rhythm and no murmurs and no lower extremity edema ABDOMEN:abdomen soft, non-tender and normal bowel sounds Musculoskeletal:no cyanosis of digits and no clubbing  NEURO: alert & oriented x 3 with fluent speech, no focal motor/sensory deficits  LABORATORY DATA:  I have reviewed the data as listed CMP Latest Ref Rng & Units 04/12/2017 04/11/2017 04/04/2017  Glucose 65 - 99 mg/dL 141(H) 107(H) 96  BUN 6 - 20 mg/dL 16 12 15.2  Creatinine 0.44 - 1.00 mg/dL 0.82 0.82 0.8  Sodium 135 - 145 mmol/L 137 138 135(L)  Potassium 3.5 - 5.1 mmol/L 3.7 3.3(L) 3.8  Chloride 101 - 111 mmol/L 100(L) 104 -  CO2 22 - 32 mmol/L _0 Calcium 8.9 - 10.3 mg/dL 9.6 9.3 9.9  Total Protein 6.5 - 8.1 g/dL 6.8 6.8 6.8  Total Bilirubin 0.3 - 1.2 mg/dL 0.3 0.6 0.64  Alkaline Phos 38 - 126 U/L 106 112 238(H)  AST 15 - 41 U/L 50(H) 48(H) 312(HH)  ALT 14 - 54 U/L 64(H) 56(H) 277(HH)    Lab Results  Component Value Date   WBC 8.1 04/12/2017   HGB 10.3 (L) 04/12/2017   HCT 32.5 (L) 04/12/2017   MCV 86.9 04/12/2017   PLT 558 (H) 04/12/2017   NEUTROABS 4.9 04/11/2017    ASSESSMENT AND PLAN: 1. CHF: Being Diuresed 2. Elevated AST and ALT: due to chronic venous congestion of liver 3. Prior PE: On anticoagulation 3. Met TNBC: On [place].  CT chest suggests progression of disease. Her cancer is quite extensive worse in Rt lung. I will D/C Halaven and try to obatain Pembrolizumab for compassionate use as a last resort Poor  prognosis  I will have Dr.Magrinat see her on Monday.

## 2017-04-12 NOTE — Progress Notes (Signed)
PROGRESS NOTE    Cindy Bradshaw  IRJ:[id number] DOB: Feb 16, 1971 DOA: 04/11/2017 PCP: [name], MD  Brief Narrative: Cindy Bradshaw is a 46 y.o. female with medical history significant of metastatic breast cancer with extensive pulmonary metastases, H/o PE on full dose lovenox, nonischemic cardiomyopathy ejection fraction of 25%, paroxysmal atrial fibrillation, asthma, presented to the ED with worsening shortness of breath, pt reports that this is gradually progressive over 1-2weeks, she was just started on O2 at 2L at her last visit with [name] and given reduced dose Halaven(chemo). She also reported low grade fevers with cough for last 1-2days, temperature upto 100.9 8/9 at home and some wheezing. ED Course: Mildly tachypneic, BNP 225, lactate and troponin negative, CXR notable for bilateral atelectasis, adenopathy and pulmonary metastasis   Assessment & Plan:   Acute hypoxic respiratory failure -Multifactorial, I suspect this is primarily secondary to progression of her pulmonary metastatic disease -mild asthma flare/bronchitis also suspected due to reports of fever, productive cough and slight wheezing -Continue ROcephin/zithromax, nebs, IV solumedrol -CTA chest today,  h/o IVP due allergy-itching, but she is on IV solumedrol now hence would not need Prednisone pre contrast -Suspicion for acute PE is less since she is on full dose therapeutic anticoagulation, but unclear how complaint she was with this -if this is mostly progression of pulm mets as suspected, not sure we can add much medically -await input from [place]  Nonischemic cardiomyopathy -EF 25% -Felt to be related to Adriamycin/chemotherapy -Clinically does not appear overtly volume overloaded, but will keep her on IV lasix today to keep her net negative  Metastatic breast cancer with extensive pulmonary metastases -On palliative chemotherapy by [name] -I have notified [name] of admission he will  follow-up today  P.Afib -converted to NSR now -Continue oral amiodarone and metoprolol -Already on full dose anticoagulation for history of PE  Chronic pain -Related to metastatic cancer  -continue fentanyl and oxycodone per home regimen  DVT prophylaxis: on full dose Lovenox Code Status:  full code Family Communication: No family at bedside Disposition Plan: Keep in SDU  Consultants:   Onc [name]   Antimicrobials:   Rocephin/Zithromax 8/9   Subjective: Still short of breath with minimal activity  Objective: Vitals:   04/12/17 0700 04/12/17 0759 04/12/17 0800 04/12/17 1015  BP: 108/67  (!) 104/59 114/67  Pulse: 73  75 79  Resp: 19  19 20   Temp:  97.7 F (36.5 C)    TempSrc:  Axillary    SpO2: 95%  96% 99%  Weight: 94.3 kg (207 lb 14.3 oz)     Height: 5\' 7"  (1.702 m)       Intake/Output Summary (Last 24 hours) at 04/12/17 1047 Last data filed at 04/12/17 1000  Gross per 24 hour  Intake             1070 ml  Output             2100 ml  Net            -1030 ml   Filed Weights   04/12/17 0700  Weight: 94.3 kg (207 lb 14.3 oz)    Examination:  General exam: frail, obese, chronically ill female, more comfortable today  Respiratory system: poor air movement, few scattered ronchi Cardiovascular system: S1 & S2 heard, RRR. No JVD, murmurs, rubs Gastrointestinal system: Abdomen is nondistended, soft and nontender. Normal bowel sounds heard. Central nervous system: Alert and oriented. No focal neurological deficits. Extremities: Symmetric 5 x 5 power.  Skin: No rashes, lesions or ulcers Psychiatry: Judgement and insight appear normal. Mood & affect appropriate.     Data Reviewed:   CBC:  Recent Labs Lab 04/11/17 1421 04/12/17 0330  WBC 7.5 8.1  NEUTROABS 4.9  --   HGB 10.7* 10.3*  HCT 33.8* 32.5*  MCV 88.0 86.9  PLT 557* 299*   Basic Metabolic Panel:  Recent Labs Lab 04/11/17 1421 04/12/17 0330  NA 138 137  K 3.3* 3.7  CL 104 100*    CO2 25 25  GLUCOSE 107* 141*  BUN 12 16  CREATININE 0.82 0.82  CALCIUM 9.3 9.6   GFR: Estimated Creatinine Clearance: 100 mL/min (by C-G formula based on SCr of 0.82 mg/dL). Liver Function Tests:  Recent Labs Lab 04/11/17 1421 04/12/17 0330  AST 48* 50*  ALT 56* 64*  ALKPHOS 112 106  BILITOT 0.6 0.3  PROT 6.8 6.8  ALBUMIN 2.8* 2.6*   No results for input(s): LIPASE, AMYLASE in the last 168 hours. No results for input(s): AMMONIA in the last 168 hours. Coagulation Profile:  Recent Labs Lab 04/11/17 1421  INR 0.98   Cardiac Enzymes: No results for input(s): CKTOTAL, CKMB, CKMBINDEX, TROPONINI in the last 168 hours. BNP (last 3 results) No results for input(s): PROBNP in the last 8760 hours. HbA1C: No results for input(s): HGBA1C in the last 72 hours. CBG: No results for input(s): GLUCAP in the last 168 hours. Lipid Profile: No results for input(s): CHOL, HDL, LDLCALC, TRIG, CHOLHDL, LDLDIRECT in the last 72 hours. Thyroid Function Tests: No results for input(s): TSH, T4TOTAL, FREET4, T3FREE, THYROIDAB in the last 72 hours. Anemia Panel: No results for input(s): VITAMINB12, FOLATE, FERRITIN, TIBC, IRON, RETICCTPCT in the last 72 hours. Urine analysis:    Component Value Date/Time   COLORURINE YELLOW 04/11/2017 [place] 04/11/2017 1716   LABSPEC 1.006 04/11/2017 1716   LABSPEC 1.010 04/10/2016 1224   PHURINE 5.0 04/11/2017 [place] 04/11/2017 1716   GLUCOSEU Negative 04/10/2016 1224   HGBUR SMALL (A) 04/11/2017 1716   BILIRUBINUR NEGATIVE 04/11/2017 1716   BILIRUBINUR Negative 04/10/2016 1224   KETONESUR NEGATIVE 04/11/2017 1716   PROTEINUR NEGATIVE 04/11/2017 1716   UROBILINOGEN 0.2 04/10/2016 1224   NITRITE NEGATIVE 04/11/2017 1716   LEUKOCYTESUR NEGATIVE 04/11/2017 1716   LEUKOCYTESUR Negative 04/10/2016 1224   Sepsis Labs: @LABRCNTIP (procalcitonin:4,lacticidven:4)  ) Recent Results (from the past 240 hour(s))   TECHNOLOGIST REVIEW     Status: None   Collection Time: 04/04/17 10:59 AM  Result Value Ref Range Status   Technologist Review 4% myelo seen  Final  MRSA PCR Screening     Status: None   Collection Time: 04/11/17  7:17 PM  Result Value Ref Range Status   MRSA by PCR NEGATIVE NEGATIVE Final    Comment:        The GeneXpert MRSA Assay (FDA approved for NASAL specimens only), is one component of a comprehensive MRSA colonization surveillance program. It is not intended to diagnose MRSA infection nor to guide or monitor treatment for MRSA infections.          Radiology Studies: Dg Chest Portable 1 View  Result Date: 04/11/2017 CLINICAL DATA:  Shortness of breath for 2 days with cough, history breast cancer post chemotherapy, history asthma, cardiomyopathy, atrial fibrillation, LEFT lumpectomy with radioactive seed implantation and sentinel node biopsy EXAM: PORTABLE CHEST 1 VIEW COMPARISON:  Portable exam 1428 hours compared to 02/22/2017 FINDINGS: RIGHT jugular Port-A-Cath with tip projecting over  SVC. Enlargement of cardiac silhouette. Prominent mediastinum, hila, and AP window identified, corresponding to adenopathy on CT. Multiple pulmonary masses again identified compatible with metastases. Bibasilar atelectasis. No gross pleural effusion or pneumothorax. Osseous structures unremarkable. IMPRESSION: Pulmonary metastases. Mediastinal and hilar adenopathy. Bibasilar atelectasis. Electronically Signed   By: Lavonia Dana M.D.   On: 04/11/2017 14:47        Scheduled Meds: . iopamidol      . amiodarone  200 mg Oral BID  . azithromycin  500 mg Oral Daily  . Chlorhexidine Gluconate Cloth  6 each Topical Q0600  . enoxaparin  1 mg/kg Subcutaneous BID  . fentaNYL  12.5 mcg Transdermal Q72H  . ferrous sulfate  325 mg Oral TID WC  . levalbuterol  1.25 mg Nebulization Q6H  . methylPREDNISolone (SOLU-MEDROL) injection  60 mg Intravenous Q6H  . metoprolol succinate  50 mg Oral Daily    Continuous Infusions: . cefTRIAXone (ROCEPHIN)  IV Stopped (04/11/17 2016)     LOS: 1 day    Time spent: 50min    Domenic Polite, MD [facility] Pager [phone]  If 7PM-7AM, please contact night-coverage [email or web address] Password [facility] 04/12/2017, 10:47 AM

## 2017-04-12 NOTE — Progress Notes (Signed)
DISCONTINUE OFF PATHWAY REGIMEN - Breast     A cycle is every 21 days:     Eribulin mesylate   **Always confirm dose/schedule in your pharmacy ordering system**    REASON: Disease Progression PRIOR TREATMENT: [id number]: Eribulin 1.4 mg/m2 D1, 8 q21 Days Until Progression or Unacceptable Toxicity TREATMENT RESPONSE: Progressive Disease (PD)  START OFF PATHWAY REGIMEN - Breast   [id number]:Pembrolizumab 2 mg/kg q21 Days:   A cycle is every 21 days:     Pembrolizumab   **Always confirm dose/schedule in your pharmacy ordering system**    Patient Characteristics: Metastatic Chemotherapy, HER2 Negative/Unknown/Equivocal, ER Negative/Unknown, [facility], Prior Eribulin Therapeutic Status: Distant Metastases BRCA Mutation Status: Absent ER Status: Negative (-) HER2 Status: Negative (-) Would you be surprised if this patient died  in the next year? I would NOT be surprised if this patient died in the next year PR Status: Negative (-) Line of therapy: [facility] and Beyond Intent of Therapy: Non-Curative / Palliative Intent, Discussed with Patient

## 2017-04-12 NOTE — Care Management Note (Signed)
Case Management Note  Patient Details  Name: Cindy Bradshaw MRN: [id number] Date of Birth: [birth year]  Subjective/Objective:     Dyspnea in breast Ca patient with extensive pulmonary mets. Recently started on o2 at 2l/min at home. EF=25%               Action/Plan: Date:  April 12, 2017 Chart reviewed for concurrent status and case management needs. Will continue to follow patient progress. Discharge Planning: following for needs Expected discharge date: [id number] Velva Harman, BSN, [place], [place]  Expected Discharge Date:   (unknown)               Expected Discharge Plan:  Home/Self Care  In-House Referral:     Discharge planning Services  CM Consult  Post Acute Care Choice:    Choice offered to:     DME Arranged:    DME Agency:     HH Arranged:    [place] Agency:     Status of Service:  In process, will continue to follow  If discussed at Long Length of Stay Meetings, dates discussed:    Additional Comments:  [name], RN 04/12/2017, 8:37 AM

## 2017-04-12 NOTE — Progress Notes (Signed)
ON PATHWAY REGIMEN - Breast  No Change  Continue With Treatment as Ordered.     A cycle is every 21 days:     Eribulin mesylate   **Always confirm dose/schedule in your pharmacy ordering system**    Patient Characteristics: Metastatic Chemotherapy, HER2 Negative/Unknown/Equivocal, ER -Verita Schneiders, Third Line, Prior Anthracycline or Anthracycline Contraindicated and Prior Taxane Therapeutic Status: Distant Metastases BRCA Mutation Status: Absent ER Status: Negative (-) HER2 Status: Negative (-) Would you be surprised if this patient died  in the next year? I would NOT be surprised if this patient died in the next year PR Status: Negative (-) Line of therapy: [facility] Intent of Therapy: Non-Curative / Palliative Intent, Discussed with Patient

## 2017-04-13 LAB — BASIC METABOLIC PANEL
ANION GAP: 9 (ref 5–15)
BUN: 22 mg/dL — ABNORMAL HIGH (ref 6–20)
CHLORIDE: 100 mmol/L — AB (ref 101–111)
CO2: 28 mmol/L (ref 22–32)
Calcium: 10.2 mg/dL (ref 8.9–10.3)
Creatinine, Ser: 0.78 mg/dL (ref 0.44–1.00)
GFR calc non Af Amer: >60 mL/min (ref >60)
Glucose, Bld: 167 mg/dL — ABNORMAL HIGH (ref 65–99)
POTASSIUM: 4.8 mmol/L (ref 3.5–5.1)
SODIUM: 137 mmol/L (ref 135–145)

## 2017-04-13 LAB — CBC
HCT: 31.5 % — ABNORMAL LOW (ref 36.0–46.0)
HEMOGLOBIN: 9.9 g/dL — AB (ref 12.0–15.0)
MCH: 27.5 pg (ref 26.0–34.0)
MCHC: 31.4 g/dL (ref 30.0–36.0)
MCV: 87.5 fL (ref 78.0–100.0)
Platelets: 616 10*3/uL — ABNORMAL HIGH (ref 150–400)
RBC: 3.6 MIL/uL — AB (ref 3.87–5.11)
RDW: 18.7 % — ABNORMAL HIGH (ref 11.5–15.5)
WBC: 17.4 10*3/uL — ABNORMAL HIGH (ref 4.0–10.5)

## 2017-04-13 MED ORDER — METHYLPREDNISOLONE SODIUM SUCC 125 MG IJ SOLR
60.0000 mg | Freq: Two times a day (BID) | INTRAMUSCULAR | Status: DC
  Administered 2017-04-13 – 2017-04-14 (×2): 60 mg via INTRAVENOUS
  Filled 2017-04-13 (×2): qty 2

## 2017-04-13 MED ORDER — FENTANYL 25 MCG/HR TD PT72
25.0000 ug | MEDICATED_PATCH | TRANSDERMAL | Status: DC
  Administered 2017-04-13 – 2017-04-15 (×2): 25 ug via TRANSDERMAL
  Filled 2017-04-13 (×2): qty 1

## 2017-04-13 MED ORDER — FUROSEMIDE 10 MG/ML IJ SOLN
40.0000 mg | Freq: Two times a day (BID) | INTRAMUSCULAR | Status: AC
  Administered 2017-04-13 – 2017-04-14 (×2): 40 mg via INTRAVENOUS
  Filled 2017-04-13 (×2): qty 4

## 2017-04-13 MED ORDER — CEFUROXIME AXETIL 250 MG PO TABS
250.0000 mg | ORAL_TABLET | Freq: Two times a day (BID) | ORAL | Status: DC
  Administered 2017-04-13 – 2017-04-18 (×10): 250 mg via ORAL
  Filled 2017-04-13 (×11): qty 1

## 2017-04-13 MED ORDER — MORPHINE SULFATE (PF) 4 MG/ML IV SOLN
2.0000 mg | INTRAVENOUS | Status: DC | PRN
  Administered 2017-04-13 – 2017-04-15 (×7): 2 mg via INTRAVENOUS
  Filled 2017-04-13 (×8): qty 1

## 2017-04-13 MED ORDER — FENTANYL 25 MCG/HR TD PT72: 25.0000 ug | MEDICATED_PATCH | TRANSDERMAL | Status: DC

## 2017-04-13 NOTE — Progress Notes (Addendum)
PROGRESS NOTE    Cindy K. Cindy Bradshaw  PRF:[id number] DOB: 05/08/1971 DOA: 04/11/2017 PCP: [name], MD  Brief Narrative: Cindy Bradshaw is a 46 y.o. female with medical history significant of metastatic breast cancer with extensive pulmonary metastases, H/o PE on full dose lovenox, nonischemic cardiomyopathy ejection fraction of 25%, paroxysmal atrial fibrillation, asthma, presented to the ED with worsening shortness of breath, pt reports that this is gradually progressive over 1-2weeks, she was just started on O2 at 2L at her last visit with [name] and given reduced dose Halaven(chemo).She also reported low grade fevers with cough for last 1-2days, temperature upto 100.9 8/9 at home and some wheezing. CT chest with worsening pulm mets and small effusions  Assessment & Plan:   Acute hypoxic respiratory failure -Multifactorial, primarily secondary to progression of her pulmonary metastatic disease -mild asthma flare/bronchitis also suspected due to reports of fever, productive cough and slight wheezing -no further wheezing, change ROcephin/zithromax to Po Ceftin, cut down IV solumedrol -CTA chest notes progression of Pulm mets and small effusion, continue IV lasix today -Appreciate [name]'s input, prognosis is poor, [name] plans to try Immunotherapy after discharge  ACute on chronic systolic CHF/Nonischemic cardiomyopathy -EF 25% -Felt to be related to Adriamycin/chemotherapy -continue IV lasix today to keep her net negative  Metastatic breast cancer with extensive pulmonary metastases -On palliative chemotherapy by [name] -see discussion above  P.Afib -converted to NSR now -Continue oral amiodarone and metoprolol -Already on full dose anticoagulation for history of PE  Chronic pain -Related to metastatic cancer  -continue fentanyl and oxycodone per home regimen, increase dose of Fentanyl  DVT prophylaxis: on full dose Lovenox Code Status:  full  code Family Communication: No family at bedside Disposition Plan: home when improved ? 2-3days  Consultants:   Onc [name]   Antimicrobials:   Rocephin/Zithromax 8/9-8/11  Cefdinir 8/11   Subjective: Breathing a little better still short of breath with minimal exertion  Objective: Vitals:   04/12/17 2227 04/13/17 0416 04/13/17 0906 04/13/17 1043  BP:  101/83  96/60  Pulse:  (!) 133  75  Resp:  20    Temp:  98 F (36.7 C)    TempSrc:  Oral    SpO2: 95% 100% 97%   Weight:      Height:        Intake/Output Summary (Last 24 hours) at 04/13/17 1112 Last data filed at 04/12/17 2200  Gross per 24 hour  Intake              290 ml  Output                0 ml  Net              290 ml   Filed Weights   04/12/17 0700  Weight: 94.3 kg (207 lb 14.3 oz)    Examination:  Gen: Frail, obese chronically ill female Awake, Alert, Oriented X 3,  HEENT: PERRLA, Neck supple, no JVD Lungs: Few rhonchi and diminished breath sounds at the bases CVS: RRR,No Gallops,Rubs or new Murmurs Abd: soft, Non tender, non distended, BS present Extremities: Trace edema Skin: no new rashes  Data Reviewed:   CBC:  Recent Labs Lab 04/11/17 1421 04/12/17 0330 04/13/17 0513  WBC 7.5 8.1 17.4*  NEUTROABS 4.9  --   --   HGB 10.7* 10.3* 9.9*  HCT 33.8* 32.5* 31.5*  MCV 88.0 86.9 87.5  PLT 557* 558* 935*   Basic Metabolic Panel:  Recent Labs Lab  04/11/17 1421 04/12/17 0330 04/13/17 0513  NA 138 137 137  K 3.3* 3.7 4.8  CL 104 100* 100*  CO2 25 25 28   GLUCOSE 107* 141* 167*  BUN 12 16 22*  CREATININE 0.82 0.82 0.78  CALCIUM 9.3 9.6 10.2   GFR: Estimated Creatinine Clearance: 102.5 mL/min (by C-G formula based on SCr of 0.78 mg/dL). Liver Function Tests:  Recent Labs Lab 04/11/17 1421 04/12/17 0330  AST 48* 50*  ALT 56* 64*  ALKPHOS 112 106  BILITOT 0.6 0.3  PROT 6.8 6.8  ALBUMIN 2.8* 2.6*   No results for input(s): LIPASE, AMYLASE in the last 168 hours. No  results for input(s): AMMONIA in the last 168 hours. Coagulation Profile:  Recent Labs Lab 04/11/17 1421  INR 0.98   Cardiac Enzymes: No results for input(s): CKTOTAL, CKMB, CKMBINDEX, TROPONINI in the last 168 hours. BNP (last 3 results) No results for input(s): PROBNP in the last 8760 hours. HbA1C: No results for input(s): HGBA1C in the last 72 hours. CBG: No results for input(s): GLUCAP in the last 168 hours. Lipid Profile: No results for input(s): CHOL, HDL, LDLCALC, TRIG, CHOLHDL, LDLDIRECT in the last 72 hours. Thyroid Function Tests: No results for input(s): TSH, T4TOTAL, FREET4, T3FREE, THYROIDAB in the last 72 hours. Anemia Panel: No results for input(s): VITAMINB12, FOLATE, FERRITIN, TIBC, IRON, RETICCTPCT in the last 72 hours. Urine analysis:    Component Value Date/Time   COLORURINE YELLOW 04/11/2017 [place] 04/11/2017 1716   LABSPEC 1.006 04/11/2017 1716   LABSPEC 1.010 04/10/2016 1224   PHURINE 5.0 04/11/2017 [place] 04/11/2017 1716   GLUCOSEU Negative 04/10/2016 1224   HGBUR SMALL (A) 04/11/2017 1716   BILIRUBINUR NEGATIVE 04/11/2017 1716   BILIRUBINUR Negative 04/10/2016 1224   KETONESUR NEGATIVE 04/11/2017 1716   PROTEINUR NEGATIVE 04/11/2017 1716   UROBILINOGEN 0.2 04/10/2016 1224   NITRITE NEGATIVE 04/11/2017 1716   LEUKOCYTESUR NEGATIVE 04/11/2017 1716   LEUKOCYTESUR Negative 04/10/2016 1224   Sepsis Labs: @LABRCNTIP (procalcitonin:4,lacticidven:4)  ) Recent Results (from the past 240 hour(s))  TECHNOLOGIST REVIEW     Status: None   Collection Time: 04/04/17 10:59 AM  Result Value Ref Range Status   Technologist Review 4% myelo seen  Final  Culture, blood (Routine x 2)     Status: None (Preliminary result)   Collection Time: 04/11/17  2:28 PM  Result Value Ref Range Status   Specimen Description BLOOD RIGHT ANTECUBITAL  Final   Special Requests   Final    BOTTLES DRAWN AEROBIC AND ANAEROBIC Blood Culture  adequate volume   Culture   Final    NO GROWTH < 24 HOURS Performed at [facility], [address], [place], [place] [address]    Report Status PENDING  Incomplete  Culture, blood (Routine x 2)     Status: None (Preliminary result)   Collection Time: 04/11/17  2:56 PM  Result Value Ref Range Status   Specimen Description BLOOD PORT  Final   Special Requests   Final    BOTTLES DRAWN AEROBIC AND ANAEROBIC Blood Culture adequate volume   Culture   Final    NO GROWTH < 24 HOURS Performed at [facility], [place] [address], [place], [place] [address]    Report Status PENDING  Incomplete  MRSA PCR Screening     Status: None   Collection Time: 04/11/17  7:17 PM  Result Value Ref Range Status   MRSA by PCR NEGATIVE NEGATIVE  Final    Comment:        The GeneXpert MRSA Assay (FDA approved for NASAL specimens only), is one component of a comprehensive MRSA colonization surveillance program. It is not intended to diagnose MRSA infection nor to guide or monitor treatment for MRSA infections.          Radiology Studies: Ct Head Wo Contrast  Result Date: 04/12/2017 CLINICAL DATA:  History of metastatic breast carcinoma and pulmonary embolus. The patient is on Lovenox. Increasing shortness breath over the past 1-2 weeks. EXAM: CT HEAD WITHOUT CONTRAST TECHNIQUE: Contiguous axial images were obtained from the base of the skull through the vertex without intravenous contrast. COMPARISON:  None. FINDINGS: Brain: Appears normal without hemorrhage, infarct, mass lesion, mass effect, midline shift or abnormal extra-axial fluid collection. No hydrocephalus pneumocephalus. Vascular: Negative. Skull: Intact.  No focal bone lesion is identified. Sinuses/Orbits: Negative. Other: None. IMPRESSION: Negative head CT. Electronically Signed   By: Inge Rise M.D.   On: 04/12/2017 12:32   Ct Angio Chest Pe W Or Wo Contrast  Result Date: 04/12/2017 CLINICAL DATA:  Shortness of Breath and  hypoxia EXAM: CT ANGIOGRAPHY CHEST WITH CONTRAST TECHNIQUE: Multidetector CT imaging of the chest was performed using the standard protocol during bolus administration of intravenous contrast. Multiplanar CT image reconstructions and MIPs were obtained to evaluate the vascular anatomy. CONTRAST:  100 mL Isovue 370. COMPARISON:  02/23/2017 FINDINGS: Cardiovascular: The thoracic aorta shows no significant atherosclerotic calcifications. No aneurysmal dilatation or dissection is identified. The pulmonary artery is well visualized and demonstrates a normal branching pattern. Some attenuation of the branches is noted secondary to hilar adenopathy. No definitive filling defect to suggest pulmonary embolism is identified. The previously seen right-sided pulmonary embolus has resolved in the interval. Mediastinum/Nodes: There again noted changes consistent with the given clinical history of metastatic breast carcinoma with mediastinal and hilar adenopathy identified. The overall appearance is stable when compared with the prior exam. Lungs/Pleura: Moderate right-sided pleural effusion is noted. Tiny left pleural effusion is seen. Lower lobe atelectatic changes are seen. A large infrahilar right middle lobe mass lesion is noted. Scattered pulmonary metastatic lesions are noted throughout both lungs. Some of these have increased in size particularly along the left cardiac border in the lingula on image number 60 of series 7 as well as in the lower lobe on the same image. Upper Abdomen: No acute abnormality in the upper abdomen noted. Musculoskeletal: Degenerative changes of thoracic spine are noted. No definitive bony metastatic disease is noted. Lesions are again noted in the right breast consistent with the given clinical history P Review of the MIP images confirms the above findings. IMPRESSION: Changes consistent with extensive metastatic disease in the lungs bilaterally as well as the hila and mediastinum. Some slight  increase in some of the lung lesions is noted. No evidence of pulmonary emboli. The previously seen right pulmonary embolus has resolved in the interval. Bilateral pleural effusions right greater than left. Changes consistent with right breast carcinoma Electronically Signed   By: Inez Catalina M.D.   On: 04/12/2017 12:38   Dg Chest Portable 1 View  Result Date: 04/11/2017 CLINICAL DATA:  Shortness of breath for 2 days with cough, history breast cancer post chemotherapy, history asthma, cardiomyopathy, atrial fibrillation, LEFT lumpectomy with radioactive seed implantation and sentinel node biopsy EXAM: PORTABLE CHEST 1 VIEW COMPARISON:  Portable exam 1428 hours compared to 02/22/2017 FINDINGS: RIGHT jugular Port-A-Cath with tip projecting over SVC. Enlargement of cardiac silhouette. Prominent mediastinum,  hila, and AP window identified, corresponding to adenopathy on CT. Multiple pulmonary masses again identified compatible with metastases. Bibasilar atelectasis. No gross pleural effusion or pneumothorax. Osseous structures unremarkable. IMPRESSION: Pulmonary metastases. Mediastinal and hilar adenopathy. Bibasilar atelectasis. Electronically Signed   By: Lavonia Dana M.D.   On: 04/11/2017 14:47        Scheduled Meds: . amiodarone  200 mg Oral BID  . azithromycin  500 mg Oral Daily  . Chlorhexidine Gluconate Cloth  6 each Topical Q0600  . enoxaparin  1 mg/kg Subcutaneous BID  . [START ON 04/15/2017] fentaNYL  25 mcg Transdermal Q72H  . ferrous sulfate  325 mg Oral TID WC  . furosemide  40 mg Intravenous BID  . furosemide  40 mg Intravenous BID  . levalbuterol  1.25 mg Nebulization TID  . methylPREDNISolone (SOLU-MEDROL) injection  60 mg Intravenous Q12H  . metoprolol succinate  50 mg Oral Daily  . sodium chloride flush  10-40 mL Intracatheter Q12H   Continuous Infusions: . cefTRIAXone (ROCEPHIN)  IV Stopped (04/12/17 2057)     LOS: 2 days    Time spent: 48min    Domenic Polite,  MD [facility] Pager [phone]  If 7PM-7AM, please contact night-coverage [email or web address] Password [email or web address] 04/13/2017, 11:12 AM

## 2017-04-13 NOTE — Progress Notes (Signed)
2mg  of IV morphine not given at 1042 was wasted with Cyd Silence.  Unable to give at the time due to systolic bp <886.  Wasted in the sink.

## 2017-04-14 ENCOUNTER — Inpatient Hospital Stay (HOSPITAL_COMMUNITY): Payer: Federal, State, Local not specified - PPO

## 2017-04-14 LAB — BASIC METABOLIC PANEL
Anion gap: 7 (ref 5–15)
BUN: 21 mg/dL — AB (ref 6–20)
CALCIUM: 10.1 mg/dL (ref 8.9–10.3)
CO2: 30 mmol/L (ref 22–32)
Chloride: 100 mmol/L — ABNORMAL LOW (ref 101–111)
Creatinine, Ser: 0.71 mg/dL (ref 0.44–1.00)
GFR calc Af Amer: >60 mL/min (ref >60)
GLUCOSE: 126 mg/dL — AB (ref 65–99)
POTASSIUM: 4.7 mmol/L (ref 3.5–5.1)
Sodium: 137 mmol/L (ref 135–145)

## 2017-04-14 LAB — CBC
HCT: 29.7 % — ABNORMAL LOW (ref 36.0–46.0)
Hemoglobin: 9.2 g/dL — ABNORMAL LOW (ref 12.0–15.0)
MCH: 27.7 pg (ref 26.0–34.0)
MCHC: 31 g/dL (ref 30.0–36.0)
MCV: 89.5 fL (ref 78.0–100.0)
PLATELETS: 560 10*3/uL — AB (ref 150–400)
RBC: 3.32 MIL/uL — AB (ref 3.87–5.11)
RDW: 18.9 % — AB (ref 11.5–15.5)
WBC: 17 10*3/uL — ABNORMAL HIGH (ref 4.0–10.5)

## 2017-04-14 MED ORDER — ALBUTEROL SULFATE (2.5 MG/3ML) 0.083% IN NEBU
2.5000 mg | INHALATION_SOLUTION | RESPIRATORY_TRACT | Status: DC | PRN
Start: 2017-04-15 — End: 2017-04-27

## 2017-04-14 MED ORDER — LEVALBUTEROL HCL 0.63 MG/3ML IN NEBU
0.6300 mg | INHALATION_SOLUTION | Freq: Three times a day (TID) | RESPIRATORY_TRACT | Status: DC
  Administered 2017-04-15 – 2017-04-22 (×20): 0.63 mg via RESPIRATORY_TRACT
  Filled 2017-04-14 (×23): qty 3

## 2017-04-14 MED ORDER — PREDNISONE 20 MG PO TABS
50.0000 mg | ORAL_TABLET | Freq: Every day | ORAL | Status: DC
  Administered 2017-04-15 – 2017-04-20 (×6): 50 mg via ORAL
  Filled 2017-04-14 (×2): qty 1
  Filled 2017-04-14 (×3): qty 2
  Filled 2017-04-14: qty 1

## 2017-04-14 MED ORDER — CHLORHEXIDINE GLUCONATE CLOTH 2 % EX PADS
6.0000 | MEDICATED_PAD | Freq: Every day | CUTANEOUS | Status: DC
  Administered 2017-04-14 – 2017-04-22 (×4): 6 via TOPICAL

## 2017-04-14 MED ORDER — METHYLPREDNISOLONE SODIUM SUCC 40 MG IJ SOLR
40.0000 mg | Freq: Two times a day (BID) | INTRAMUSCULAR | Status: AC
  Administered 2017-04-14: 40 mg via INTRAVENOUS
  Filled 2017-04-14: qty 1

## 2017-04-14 MED ORDER — METOPROLOL TARTRATE 25 MG PO TABS
25.0000 mg | ORAL_TABLET | Freq: Once | ORAL | Status: AC
  Administered 2017-04-14: 25 mg via ORAL
  Filled 2017-04-14: qty 1

## 2017-04-14 NOTE — Progress Notes (Signed)
PROGRESS NOTE    [name]  PZW:[id number] DOB: 1970/09/11 DOA: 04/11/2017 PCP: [name], MD  Brief Narrative: Cindy Bradshaw. Cindy Bradshaw is a 46 y.o. female with medical history significant of metastatic breast cancer with extensive pulmonary metastases, H/o PE on full dose lovenox, nonischemic cardiomyopathy ejection fraction of 25%, paroxysmal atrial fibrillation, asthma, presented to the ED with worsening shortness of breath, pt reports that this is gradually progressive over 1-2weeks, she was just started on O2 at 2L at her last visit with [name] and given reduced dose [place](chemo).She also reported low grade fevers with cough for last 1-2days, temperature upto 100.9 8/9 at home and some wheezing. CT chest with worsening pulm mets and small effusions  Assessment & Plan:   Acute hypoxic respiratory failure -Multifactorial, primarily secondary to progression of her pulmonary metastatic disease -mild asthma flare/bronchitis also suspected due to reports of fever, productive cough and slight wheezing -mild improvement, ROcephin/zithromax changed to Po Ceftin, cut down IV solumedrol, change to prednisone tomorrow -CTA chest notes progression of Pulm mets and small effusion, continue IV lasix today -Appreciate [name]'s input, prognosis is poor, [name] plans to try Immunotherapy after discharge -ambulate, OOB, Pt eval  ACute on chronic systolic CHF/Nonischemic cardiomyopathy -EF 25% -Felt to be related to Adriamycin/chemotherapy -continue IV lasix today to keep her net negative -I/o inaccurate unfortunately  Metastatic breast cancer with extensive pulmonary metastases -On palliative chemotherapy by [name] -see discussion above  P.Afib -converted to NSR now -Continue oral amiodarone and metoprolol -Already on full dose anticoagulation for history of PE  Chronic pain -Related to metastatic cancer  -continue fentanyl and oxycodone per home regimen, increased dose  of Fentanyl dose to 32mcg  DVT prophylaxis: on full dose Lovenox Code Status:  full code Family Communication: No family at bedside Disposition Plan: home in 1-2days  Consultants:   Onc [name]   Antimicrobials:   Rocephin/Zithromax 8/9-8/11  Cefdinir 8/11   Subjective: -breathing a little better, no N/V  Objective: Vitals:   04/13/17 2245 04/14/17 0409 04/14/17 0747 04/14/17 0937  BP: (!) 99/55 118/69  108/68  Pulse: 74 73  81  Resp: 18 18    Temp: 98.5 F (36.9 C) 97.7 F (36.5 C)    TempSrc: Oral Oral    SpO2: 99% 100% 96%   Weight:      Height:        Intake/Output Summary (Last 24 hours) at 04/14/17 1137 Last data filed at 04/14/17 0600  Gross per 24 hour  Intake             1680 ml  Output             1750 ml  Net              -70 ml   Filed Weights   04/12/17 0700  Weight: 94.3 kg (207 lb 14.3 oz)    Examination:  Gen: Obese, chronically ill African-American female with alopecia HEENT: PERRLA, Neck supple, no JVD Lungs: Scattered rhonchi especially in both lower lobes CVS: RRR,No Gallops,Rubs or new Murmurs Abd: soft, Non tender, non distended, BS present Extremities: No Cyanosis, Clubbing or edema Skin: no new rashes  Data Reviewed:   CBC:  Recent Labs Lab 04/11/17 1421 04/12/17 0330 04/13/17 0513 04/14/17 0453  WBC 7.5 8.1 17.4* 17.0*  NEUTROABS 4.9  --   --   --   HGB 10.7* 10.3* 9.9* 9.2*  HCT 33.8* 32.5* 31.5* 29.7*  MCV 88.0 86.9 87.5 89.5  PLT 557*  558* 616* 102*   Basic Metabolic Panel:  Recent Labs Lab 04/11/17 1421 04/12/17 0330 04/13/17 0513 04/14/17 0453  NA 138 137 137 137  K 3.3* 3.7 4.8 4.7  CL 104 100* 100* 100*  CO2 25 25 28 30   GLUCOSE 107* 141* 167* 126*  BUN 12 16 22* 21*  CREATININE 0.82 0.82 0.78 0.71  CALCIUM 9.3 9.6 10.2 10.1   GFR: Estimated Creatinine Clearance: 102.5 mL/min (by C-G formula based on SCr of 0.71 mg/dL). Liver Function Tests:  Recent Labs Lab 04/11/17 1421  04/12/17 0330  AST 48* 50*  ALT 56* 64*  ALKPHOS 112 106  BILITOT 0.6 0.3  PROT 6.8 6.8  ALBUMIN 2.8* 2.6*   No results for input(s): LIPASE, AMYLASE in the last 168 hours. No results for input(s): AMMONIA in the last 168 hours. Coagulation Profile:  Recent Labs Lab 04/11/17 1421  INR 0.98   Cardiac Enzymes: No results for input(s): CKTOTAL, CKMB, CKMBINDEX, TROPONINI in the last 168 hours. BNP (last 3 results) No results for input(s): PROBNP in the last 8760 hours. HbA1C: No results for input(s): HGBA1C in the last 72 hours. CBG: No results for input(s): GLUCAP in the last 168 hours. Lipid Profile: No results for input(s): CHOL, HDL, LDLCALC, TRIG, CHOLHDL, LDLDIRECT in the last 72 hours. Thyroid Function Tests: No results for input(s): TSH, T4TOTAL, FREET4, T3FREE, THYROIDAB in the last 72 hours. Anemia Panel: No results for input(s): VITAMINB12, FOLATE, FERRITIN, TIBC, IRON, RETICCTPCT in the last 72 hours. Urine analysis:    Component Value Date/Time   COLORURINE YELLOW 04/11/2017 [place] 04/11/2017 1716   LABSPEC 1.006 04/11/2017 1716   LABSPEC 1.010 04/10/2016 1224   PHURINE 5.0 04/11/2017 [place] 04/11/2017 1716   GLUCOSEU Negative 04/10/2016 1224   HGBUR SMALL (A) 04/11/2017 1716   BILIRUBINUR NEGATIVE 04/11/2017 1716   BILIRUBINUR Negative 04/10/2016 1224   KETONESUR NEGATIVE 04/11/2017 1716   PROTEINUR NEGATIVE 04/11/2017 1716   UROBILINOGEN 0.2 04/10/2016 1224   NITRITE NEGATIVE 04/11/2017 1716   LEUKOCYTESUR NEGATIVE 04/11/2017 1716   LEUKOCYTESUR Negative 04/10/2016 1224   Sepsis Labs: @LABRCNTIP (procalcitonin:4,lacticidven:4)  ) Recent Results (from the past 240 hour(s))  Culture, blood (Routine x 2)     Status: None (Preliminary result)   Collection Time: 04/11/17  2:28 PM  Result Value Ref Range Status   Specimen Description BLOOD RIGHT ANTECUBITAL  Final   Special Requests   Final    BOTTLES DRAWN  AEROBIC AND ANAEROBIC Blood Culture adequate volume   Culture   Final    NO GROWTH 2 DAYS Performed at [facility], [address], [place], [place] [address]    Report Status PENDING  Incomplete  Culture, blood (Routine x 2)     Status: None (Preliminary result)   Collection Time: 04/11/17  2:56 PM  Result Value Ref Range Status   Specimen Description BLOOD PORT  Final   Special Requests   Final    BOTTLES DRAWN AEROBIC AND ANAEROBIC Blood Culture adequate volume   Culture   Final    NO GROWTH 2 DAYS Performed at [facility], [address], [place], [place] [address]    Report Status PENDING  Incomplete  MRSA PCR Screening     Status: None   Collection Time: 04/11/17  7:17 PM  Result Value Ref Range Status   MRSA by PCR NEGATIVE NEGATIVE Final    Comment:  The GeneXpert MRSA Assay (FDA approved for NASAL specimens only), is one component of a comprehensive MRSA colonization surveillance program. It is not intended to diagnose MRSA infection nor to guide or monitor treatment for MRSA infections.          Radiology Studies: Ct Head Wo Contrast  Result Date: 04/12/2017 CLINICAL DATA:  History of metastatic breast carcinoma and pulmonary embolus. The patient is on Lovenox. Increasing shortness breath over the past 1-2 weeks. EXAM: CT HEAD WITHOUT CONTRAST TECHNIQUE: Contiguous axial images were obtained from the base of the skull through the vertex without intravenous contrast. COMPARISON:  None. FINDINGS: Brain: Appears normal without hemorrhage, infarct, mass lesion, mass effect, midline shift or abnormal extra-axial fluid collection. No hydrocephalus pneumocephalus. Vascular: Negative. Skull: Intact.  No focal bone lesion is identified. Sinuses/Orbits: Negative. Other: None. IMPRESSION: Negative head CT. Electronically Signed   By: Inge Rise M.D.   On: 04/12/2017 12:32   Ct Angio Chest Pe W Or Wo Contrast  Result Date: 04/12/2017 CLINICAL DATA:   Shortness of Breath and hypoxia EXAM: CT ANGIOGRAPHY CHEST WITH CONTRAST TECHNIQUE: Multidetector CT imaging of the chest was performed using the standard protocol during bolus administration of intravenous contrast. Multiplanar CT image reconstructions and MIPs were obtained to evaluate the vascular anatomy. CONTRAST:  100 mL Isovue 370. COMPARISON:  02/23/2017 FINDINGS: Cardiovascular: The thoracic aorta shows no significant atherosclerotic calcifications. No aneurysmal dilatation or dissection is identified. The pulmonary artery is well visualized and demonstrates a normal branching pattern. Some attenuation of the branches is noted secondary to hilar adenopathy. No definitive filling defect to suggest pulmonary embolism is identified. The previously seen right-sided pulmonary embolus has resolved in the interval. Mediastinum/Nodes: There again noted changes consistent with the given clinical history of metastatic breast carcinoma with mediastinal and hilar adenopathy identified. The overall appearance is stable when compared with the prior exam. Lungs/Pleura: Moderate right-sided pleural effusion is noted. Tiny left pleural effusion is seen. Lower lobe atelectatic changes are seen. A large infrahilar right middle lobe mass lesion is noted. Scattered pulmonary metastatic lesions are noted throughout both lungs. Some of these have increased in size particularly along the left cardiac border in the lingula on image number 60 of series 7 as well as in the lower lobe on the same image. Upper Abdomen: No acute abnormality in the upper abdomen noted. Musculoskeletal: Degenerative changes of thoracic spine are noted. No definitive bony metastatic disease is noted. Lesions are again noted in the right breast consistent with the given clinical history P Review of the MIP images confirms the above findings. IMPRESSION: Changes consistent with extensive metastatic disease in the lungs bilaterally as well as the hila and  mediastinum. Some slight increase in some of the lung lesions is noted. No evidence of pulmonary emboli. The previously seen right pulmonary embolus has resolved in the interval. Bilateral pleural effusions right greater than left. Changes consistent with right breast carcinoma Electronically Signed   By: Inez Catalina M.D.   On: 04/12/2017 12:38        Scheduled Meds: . amiodarone  200 mg Oral BID  . cefUROXime  250 mg Oral BID WC  . Chlorhexidine Gluconate Cloth  6 each Topical Q0600  . enoxaparin  1 mg/kg Subcutaneous BID  . fentaNYL  25 mcg Transdermal Q72H  . ferrous sulfate  325 mg Oral TID WC  . levalbuterol  1.25 mg Nebulization TID  . methylPREDNISolone (SOLU-MEDROL) injection  60 mg Intravenous Q12H  . metoprolol succinate  50 mg Oral Daily  . sodium chloride flush  10-40 mL Intracatheter Q12H   Continuous Infusions:    LOS: 3 days    Time spent: 27min    Domenic Polite, MD [facility] Pager [phone]  If 7PM-7AM, please contact night-coverage [email or web address] Password [facility] 04/14/2017, 11:37 AM

## 2017-04-14 NOTE — Progress Notes (Signed)
Pt converted into A Fib. from NSR per CMD at 1007. HR ranging from 110's to 140's. MD made aware. IV lospressor 25mg  given per MD order. At 1354 pt reported increased difficulty breathing and bilateral lower extremity neuropathic pain rated 7/10. MD made aware. 2mg  IV morphine given per MD order. Will continue to monitor.

## 2017-04-15 DIAGNOSIS — R0602 Shortness of breath: Secondary | ICD-10-CM

## 2017-04-15 DIAGNOSIS — R079 Chest pain, unspecified: Secondary | ICD-10-CM

## 2017-04-15 LAB — BASIC METABOLIC PANEL
ANION GAP: 6 (ref 5–15)
BUN: 21 mg/dL — ABNORMAL HIGH (ref 6–20)
CO2: 28 mmol/L (ref 22–32)
Calcium: 10 mg/dL (ref 8.9–10.3)
Chloride: 102 mmol/L (ref 101–111)
Creatinine, Ser: 0.71 mg/dL (ref 0.44–1.00)
GFR calc non Af Amer: >60 mL/min (ref >60)
Glucose, Bld: 140 mg/dL — ABNORMAL HIGH (ref 65–99)
POTASSIUM: 4.8 mmol/L (ref 3.5–5.1)
Sodium: 136 mmol/L (ref 135–145)

## 2017-04-15 LAB — CBC
HEMATOCRIT: 32.1 % — AB (ref 36.0–46.0)
HEMOGLOBIN: 10.1 g/dL — AB (ref 12.0–15.0)
MCH: 27.7 pg (ref 26.0–34.0)
MCHC: 31.5 g/dL (ref 30.0–36.0)
MCV: 88.2 fL (ref 78.0–100.0)
Platelets: 587 10*3/uL — ABNORMAL HIGH (ref 150–400)
RBC: 3.64 MIL/uL — AB (ref 3.87–5.11)
RDW: 18.9 % — ABNORMAL HIGH (ref 11.5–15.5)
WBC: 13.2 10*3/uL — ABNORMAL HIGH (ref 4.0–10.5)

## 2017-04-15 MED ORDER — FENTANYL 50 MCG/HR TD PT72
50.0000 ug | MEDICATED_PATCH | TRANSDERMAL | Status: DC
  Administered 2017-04-15 – 2017-04-24 (×4): 50 ug via TRANSDERMAL
  Filled 2017-04-15 (×4): qty 1

## 2017-04-15 MED ORDER — PANTOPRAZOLE SODIUM 40 MG PO TBEC
40.0000 mg | DELAYED_RELEASE_TABLET | Freq: Every day | ORAL | Status: DC
  Administered 2017-04-16 – 2017-04-27 (×12): 40 mg via ORAL
  Filled 2017-04-15 (×12): qty 1

## 2017-04-15 MED ORDER — MORPHINE SULFATE (PF) 4 MG/ML IV SOLN
1.0000 mg | INTRAVENOUS | Status: DC | PRN
  Administered 2017-04-15 (×2): 1 mg via INTRAVENOUS
  Administered 2017-04-15 – 2017-04-17 (×8): 2 mg via INTRAVENOUS
  Filled 2017-04-15 (×9): qty 1

## 2017-04-15 MED ORDER — MORPHINE SULFATE (PF) 4 MG/ML IV SOLN
INTRAVENOUS | Status: AC
  Filled 2017-04-15: qty 1

## 2017-04-15 MED ORDER — ALUM & MAG HYDROXIDE-SIMETH 200-200-20 MG/5ML PO SUSP
15.0000 mL | Freq: Four times a day (QID) | ORAL | Status: DC | PRN
  Administered 2017-04-15 – 2017-04-21 (×2): 15 mL via ORAL
  Filled 2017-04-15 (×2): qty 30

## 2017-04-15 MED ORDER — ALPRAZOLAM 0.5 MG PO TABS
0.5000 mg | ORAL_TABLET | Freq: Two times a day (BID) | ORAL | Status: DC | PRN
  Administered 2017-04-15: 0.5 mg via ORAL
  Filled 2017-04-15: qty 1

## 2017-04-15 NOTE — Progress Notes (Signed)
Cindy Bradshaw   DOB:03/23/1971   TM#:[id number]   GXQ#:[id number]  Subjective:  Patient tells me she was SOB yesterday, feels  A little better this AM; has not been OOB much; has some pain in the sternal area; generally feels weak and has not been ambulating here except to [facility]; no family in room   Objective: middle aged African American woman examined in bed Vitals:   04/14/17 2332 04/15/17 0450  BP:  93/63  Pulse:  (!) 127  Resp:  18  Temp:  98.2 F (36.8 C)  SpO2: 99% 98%    Body mass index is 32.56 kg/m.  Intake/Output Summary (Last 24 hours) at 04/15/17 0709 Last data filed at 04/14/17 1822  Gross per 24 hour  Intake              840 ml  Output              500 ml  Net              340 ml     Lungs no rales or wheezes--auscultated anterolaterally  Heart regular rate and rhythm  Abdomen obese,  soft, +BS  Neuro nonfocal  Breast exam: deferred  CBG (last 3)  No results for input(s): GLUCAP in the last 72 hours.   Labs:  Lab Results  Component Value Date   WBC 13.2 (H) 04/15/2017   HGB 10.1 (L) 04/15/2017   HCT 32.1 (L) 04/15/2017   MCV 88.2 04/15/2017   PLT 587 (H) 04/15/2017   NEUTROABS 4.9 04/11/2017    @LASTCHEMISTRY @  Urine Studies No results for input(s): UHGB, CRYS in the last 72 hours.  Invalid input(s): UACOL, UAPR, USPG, UPH, UTP, UGL, UKET, UBIL, UNIT, UROB, [place], UEPI, UWBC, Duwayne Heck [place], [place]  Basic Metabolic Panel:  Recent Labs Lab 04/11/17 1421 04/12/17 0330 04/13/17 0513 04/14/17 0453 04/15/17 0447  NA 138 137 137 137 136  K 3.3* 3.7 4.8 4.7 4.8  CL 104 100* 100* 100* 102  CO2 25 25 28 30 28   GLUCOSE 107* 141* 167* 126* 140*  BUN 12 16 22* 21* 21*  CREATININE 0.82 0.82 0.78 0.71 0.71  CALCIUM 9.3 9.6 10.2 10.1 10.0   GFR Estimated Creatinine Clearance: 102.5 mL/min (by C-G formula based on SCr of 0.71 mg/dL). Liver Function Tests:  Recent Labs Lab 04/11/17 1421 04/12/17 0330  AST 48* 50*  ALT 56* 64*  ALKPHOS  112 106  BILITOT 0.6 0.3  PROT 6.8 6.8  ALBUMIN 2.8* 2.6*   No results for input(s): LIPASE, AMYLASE in the last 168 hours. No results for input(s): AMMONIA in the last 168 hours. Coagulation profile  Recent Labs Lab 04/11/17 1421  INR 0.98    CBC:  Recent Labs Lab 04/11/17 1421 04/12/17 0330 04/13/17 0513 04/14/17 0453 04/15/17 0447  WBC 7.5 8.1 17.4* 17.0* 13.2*  NEUTROABS 4.9  --   --   --   --   HGB 10.7* 10.3* 9.9* 9.2* 10.1*  HCT 33.8* 32.5* 31.5* 29.7* 32.1*  MCV 88.0 86.9 87.5 89.5 88.2  PLT 557* 558* 616* 560* 587*   Cardiac Enzymes: No results for input(s): CKTOTAL, CKMB, CKMBINDEX, TROPONINI in the last 168 hours. BNP: Invalid input(s): POCBNP CBG: No results for input(s): GLUCAP in the last 168 hours. D-Dimer No results for input(s): DDIMER in the last 72 hours. Hgb A1c No results for input(s): HGBA1C in the last 72 hours. Lipid Profile No results for input(s): CHOL, HDL, LDLCALC, TRIG,  CHOLHDL, LDLDIRECT in the last 72 hours. Thyroid function studies No results for input(s): TSH, T4TOTAL, T3FREE, THYROIDAB in the last 72 hours.  Invalid input(s): FREET3 Anemia work up No results for input(s): VITAMINB12, FOLATE, FERRITIN, TIBC, IRON, RETICCTPCT in the last 72 hours. Microbiology Recent Results (from the past 240 hour(s))  Culture, blood (Routine x 2)     Status: None (Preliminary result)   Collection Time: 04/11/17  2:28 PM  Result Value Ref Range Status   Specimen Description BLOOD RIGHT ANTECUBITAL  Final   Special Requests   Final    BOTTLES DRAWN AEROBIC AND ANAEROBIC Blood Culture adequate volume   Culture   Final    NO GROWTH 3 DAYS Performed at [facility], [address], [place], [place] [address]    Report Status PENDING  Incomplete  Culture, blood (Routine x 2)     Status: None (Preliminary result)   Collection Time: 04/11/17  2:56 PM  Result Value Ref Range Status   Specimen Description BLOOD PORT  Final   Special  Requests   Final    BOTTLES DRAWN AEROBIC AND ANAEROBIC Blood Culture adequate volume   Culture   Final    NO GROWTH 3 DAYS Performed at [facility], [address] [place],  [address]    Report Status PENDING  Incomplete  MRSA PCR Screening     Status: None   Collection Time: 04/11/17  7:17 PM  Result Value Ref Range Status   MRSA by PCR NEGATIVE NEGATIVE Final    Comment:        The GeneXpert MRSA Assay (FDA approved for NASAL specimens only), is one component of a comprehensive MRSA colonization surveillance program. It is not intended to diagnose MRSA infection nor to guide or monitor treatment for MRSA infections.       Studies:  Dg Chest Port 1 View  Result Date: 04/14/2017 CLINICAL DATA:  Hypoxia today, breast cancer with pulmonary metastases, asthma, atrial fibrillation EXAM: PORTABLE CHEST 1 VIEW COMPARISON:  Portable exam 1414 hours compared 04/11/2017 FINDINGS: RIGHT jugular Port-A-Cath stable tip projecting over cavoatrial junction. Enlargement of cardiac silhouette. Enlarged central pulmonary arteries. Bibasilar pleural effusions and atelectasis. RIGHT upper lobe opacity question metastasis. Probable LEFT lower lobe and LEFT perihilar metastases. No pneumothorax. No definite osseous lesions. IMPRESSION: Pulmonary metastases with bibasilar pleural effusions atelectasis. Worsened bibasilar aeration since previous exam. Electronically Signed   By: Lavonia Dana M.D.   On: 04/14/2017 15:28    Assessment: 46 y.o. [place] woman with stage IV breast cancer primarily involving lungs, most recently progressing through capecitabine, then eribulin (last dose 16/96/7893), complicated by severe CHF  Plan:  I asked Ms Enriques why she was so SOB and she said she did not know. However Dr Lindi Adie has discussed her situation with her before, and today I reviewed the unfortunate facts of incurable breast cancer with lung involvement and severe CHF, both contributing to her  SOB and her (fortunately not severe) chest pain.  Ms Folden is hoping to go home soon. Her son lives with her and can be of help but he works during the day. She is interested in trying further treatment for her breast cancer--and therefore is not a Hospice candidate at this point. She has an appointment with Dr Lindi Adie 08/23 at 10:15 PM to start palliative immunotherapy  Hopefully with optimal management of her CHF she may be stabilized enough for discharge.  Dr Lindi Adie is out this week. Please let  us know if we can be of further help.   Chauncey Cruel, MD 04/15/2017  7:09 AM Medical Oncology and Hematology [facility] [address] [place], [place] [address] Tel. [phone]    Fax. [phone]

## 2017-04-15 NOTE — Evaluation (Signed)
Physical Therapy Evaluation Patient Details Name: Cindy Bradshaw MRN: [id number] DOB: April 09, 1971 Today's Date: 04/15/2017   History of Present Illness  46 yo female admitted with dyspnea, CHF, A fib. Hx of Afib, CHF, stage IV breast cancer with mets to lungs, PE, EJ 25%  Clinical Impression  On eval, pt required Min guard-Min assist for mobility. She walked ~60'x2 with a RW. HR between 110-130 bpm during session. Pt c/o chest pain at start of session (spoke with RN who reported pt had recently been medicated). Pt tearful during session. She did not speak much. No family present during session. Pt stated she plans to return home at discharge. Recommend HHPT and 24 hour supervision/assist. Will follow during hospital stay.     Follow Up Recommendations Home health PT;Supervision/Assistance - 24 hour    Equipment Recommendations  None recommended by PT    Recommendations for Other Services       Precautions / Restrictions Precautions Precautions: Fall Precaution Comments: O2 dep Restrictions Weight Bearing Restrictions: No      Mobility  Bed Mobility Overal bed mobility: Needs Assistance Bed Mobility: Supine to Sit;Sit to Supine     Supine to sit: Min guard;HOB elevated Sit to supine: Min guard;HOB elevated   General bed mobility comments: close guard for safety. increased time.   Transfers Overall transfer level: Needs assistance Equipment used: Rolling walker (2 wheeled) Transfers: Sit to/from Stand Sit to Stand: Min assist         General transfer comment: Small amount of assist to rise. Increased time. VCs hand placement  Ambulation/Gait Ambulation/Gait assistance: Min guard Ambulation Distance (Feet): 60 Feet (x2) Assistive device: Rolling walker (2 wheeled) Gait Pattern/deviations: Step-through pattern     General Gait Details: close guard for safety. Slow gait speed. O2 sat >90% on 3L O2. Seated rest break needed/taken.   Stairs             Wheelchair Mobility    Modified Rankin (Stroke Patients Only)       Balance Overall balance assessment: Needs assistance         Standing balance support: Bilateral upper extremity supported Standing balance-Leahy Scale: Poor                               Pertinent Vitals/Pain Pain Assessment: Faces Faces Pain Scale: Hurts worst Pain Location: chest. pt tearful Pain Intervention(s): Premedicated before session;Limited activity within patient's tolerance    Home Living Family/patient expects to be discharged to:: Private residence Living Arrangements: Alone Available Help at Discharge: Family;Available PRN/intermittently (son works during day) Type of Home: House Home Access: Level entry     Home Layout: One level [place]: Environmental consultant - 2 wheels      Prior Function Level of Independence: Independent with assistive device(s)         Comments: uses RW PRN     Hand Dominance        Extremity/Trunk Assessment   Upper Extremity Assessment Upper Extremity Assessment: Generalized weakness    Lower Extremity Assessment Lower Extremity Assessment: Generalized weakness    Cervical / Trunk Assessment Cervical / Trunk Assessment: Normal  Communication   Communication: No difficulties  Cognition Arousal/Alertness: Awake/alert Behavior During Therapy: WFL for tasks assessed/performed Overall Cognitive Status: Within Functional Limits for tasks assessed  General Comments      Exercises     Assessment/Plan    PT Assessment Patient needs continued PT services  PT Problem List Decreased strength;Decreased mobility;Decreased activity tolerance;Decreased balance;Decreased knowledge of use of DME;Pain       PT Treatment Interventions DME instruction;Gait training;Therapeutic activities;Therapeutic exercise;Patient/family education;Balance training;Functional mobility training    PT  Goals (Current goals can be found in the Care Plan section)  Acute Rehab PT Goals Patient Stated Goal: less pain PT Goal Formulation: With patient Time For Goal Achievement: 04/29/17 Potential to Achieve Goals: Good    Frequency Min 3X/week   Barriers to discharge        Co-evaluation               AM-PAC PT "6 Clicks" Daily Activity  Outcome Measure Difficulty turning over in bed (including adjusting bedclothes, sheets and blankets)?: A Little Difficulty moving from lying on back to sitting on the side of the bed? : A Little Difficulty sitting down on and standing up from a chair with arms (e.g., wheelchair, bedside commode, etc,.)?: A Little Help needed moving to and from a bed to chair (including a wheelchair)?: A Little Help needed walking in hospital room?: A Little Help needed climbing 3-5 steps with a railing? : A Little 6 Click Score: 18    End of Session Equipment Utilized During Treatment: Oxygen Activity Tolerance: Patient limited by fatigue;Patient limited by pain Patient left: in bed;with call bell/phone within reach   PT Visit Diagnosis: Muscle weakness (generalized) (M62.81);Difficulty in walking, not elsewhere classified (R26.2)    Time: 8841-6606 PT Time Calculation (min) (ACUTE ONLY): 21 min   Charges:   PT Evaluation $PT Eval Low Complexity: 1 Low     PT G Codes:          Weston Anna, MPT Pager: [phone]

## 2017-04-15 NOTE — Progress Notes (Signed)
PROGRESS NOTE    Cindy Bradshaw  MPN:[id number] DOB: 1971-07-19 DOA: 04/11/2017 PCP: [name], MD  Brief Narrative: Cindy Bradshaw is a 46 y.o. female with medical history significant of metastatic breast cancer with extensive pulmonary metastases, H/o PE on full dose lovenox, nonischemic cardiomyopathy ejection fraction of 25%, paroxysmal atrial fibrillation, asthma, presented to the ED with worsening shortness of breath, pt reports that this is gradually progressive over 1-2weeks, she was just started on O2 at 2L at her last visit with [name] and given reduced dose Halaven(chemo).She also reported low grade fevers with cough for last 1-2days, temperature upto 100.9 8/9 at home and some wheezing. CT chest with worsening pulm mets and small effusions  Assessment & Plan:   Acute hypoxic respiratory failure -Multifactorial, primarily secondary to progression of her pulmonary metastatic disease -mild asthma flare/bronchitis also suspected due to reports of fever, productive cough and slight wheezing -mild improvement, ROcephin/zithromax changed to Po Ceftin, cut down IV solumedrol, change to prednisone tomorrow -CTA chest notes progression of Pulm mets and small effusion, diuresed with IV lasix for 4days, now BP soft will hold diuretic today -Appreciate [name]'s input, prognosis is poor, [name] plans to try Immunotherapy after discharge -Physical therapy to see today, encourage increased ambulation  ACute on chronic systolic CHF/Nonischemic cardiomyopathy -EF 25% -Felt to be related to Adriamycin/chemotherapy -hold IV lasix since BP soft -I/o inaccurate unfortunately, urine output not recorded for several days  Metastatic breast cancer with extensive pulmonary metastases -On palliative chemotherapy by [name] -see discussion above  P.Afib -with intermittent RVR, pulm disease from mets contributing -Continue oral amiodarone and metoprolol -Already on full dose  anticoagulation for history of PE  Chronic pain -Related to metastatic cancer  -continue fentanyl and oxycodone per home regimen, increased dose of Fentanyl dose to 70mcg  DVT prophylaxis: on full dose Lovenox Code Status:  full code Family Communication: No family at bedside Disposition Plan: home in 1-2days  Consultants:   Onc [name]   Antimicrobials:   Rocephin/Zithromax 8/9-8/11  Cefdinir 8/11   Subjective: -reports breathing a little better, able to get OOB some  Objective: Vitals:   04/14/17 2332 04/15/17 0450 04/15/17 0758 04/15/17 0934  BP:  93/63  96/62  Pulse:  (!) 127    Resp:  18    Temp:  98.2 F (36.8 C)    TempSrc:  Oral    SpO2: 99% 98% 93%   Weight:      Height:        Intake/Output Summary (Last 24 hours) at 04/15/17 1106 Last data filed at 04/14/17 1822  Gross per 24 hour  Intake              720 ml  Output              500 ml  Net              220 ml   Filed Weights   04/12/17 0700  Weight: 94.3 kg (207 lb 14.3 oz)    Examination:  Gen: Obese, chronically left American female, flat affect HEENT: PERRLA, Neck supple, no JVD Lungs: Few scattered rhonchi in lower lobes improved air movement bilaterally CVS: S1-S2 regular rate and rhythm  Abd: soft, Non tender, non distended, BS present Extremities: No Cyanosis, Clubbing or edema Skin: no new rashes  Data Reviewed:   CBC:  Recent Labs Lab 04/11/17 1421 04/12/17 0330 04/13/17 0513 04/14/17 0453 04/15/17 0447  WBC 7.5 8.1 17.4* 17.0* 13.2*  NEUTROABS  4.9  --   --   --   --   HGB 10.7* 10.3* 9.9* 9.2* 10.1*  HCT 33.8* 32.5* 31.5* 29.7* 32.1*  MCV 88.0 86.9 87.5 89.5 88.2  PLT 557* 558* 616* 560* 053*   Basic Metabolic Panel:  Recent Labs Lab 04/11/17 1421 04/12/17 0330 04/13/17 0513 04/14/17 0453 04/15/17 0447  NA 138 137 137 137 136  K 3.3* 3.7 4.8 4.7 4.8  CL 104 100* 100* 100* 102  CO2 25 25 28 30 28   GLUCOSE 107* 141* 167* 126* 140*  BUN 12 16 22* 21*  21*  CREATININE 0.82 0.82 0.78 0.71 0.71  CALCIUM 9.3 9.6 10.2 10.1 10.0   GFR: Estimated Creatinine Clearance: 102.5 mL/min (by C-G formula based on SCr of 0.71 mg/dL). Liver Function Tests:  Recent Labs Lab 04/11/17 1421 04/12/17 0330  AST 48* 50*  ALT 56* 64*  ALKPHOS 112 106  BILITOT 0.6 0.3  PROT 6.8 6.8  ALBUMIN 2.8* 2.6*   No results for input(s): LIPASE, AMYLASE in the last 168 hours. No results for input(s): AMMONIA in the last 168 hours. Coagulation Profile:  Recent Labs Lab 04/11/17 1421  INR 0.98   Cardiac Enzymes: No results for input(s): CKTOTAL, CKMB, CKMBINDEX, TROPONINI in the last 168 hours. BNP (last 3 results) No results for input(s): PROBNP in the last 8760 hours. HbA1C: No results for input(s): HGBA1C in the last 72 hours. CBG: No results for input(s): GLUCAP in the last 168 hours. Lipid Profile: No results for input(s): CHOL, HDL, LDLCALC, TRIG, CHOLHDL, LDLDIRECT in the last 72 hours. Thyroid Function Tests: No results for input(s): TSH, T4TOTAL, FREET4, T3FREE, THYROIDAB in the last 72 hours. Anemia Panel: No results for input(s): VITAMINB12, FOLATE, FERRITIN, TIBC, IRON, RETICCTPCT in the last 72 hours. Urine analysis:    Component Value Date/Time   COLORURINE YELLOW 04/11/2017 [place] 04/11/2017 1716   LABSPEC 1.006 04/11/2017 1716   LABSPEC 1.010 04/10/2016 1224   PHURINE 5.0 04/11/2017 [place] 04/11/2017 1716   GLUCOSEU Negative 04/10/2016 1224   HGBUR SMALL (A) 04/11/2017 1716   BILIRUBINUR NEGATIVE 04/11/2017 1716   BILIRUBINUR Negative 04/10/2016 1224   KETONESUR NEGATIVE 04/11/2017 1716   PROTEINUR NEGATIVE 04/11/2017 1716   UROBILINOGEN 0.2 04/10/2016 1224   NITRITE NEGATIVE 04/11/2017 1716   LEUKOCYTESUR NEGATIVE 04/11/2017 1716   LEUKOCYTESUR Negative 04/10/2016 1224   Sepsis Labs: @LABRCNTIP (procalcitonin:4,lacticidven:4)  ) Recent Results (from the past 240 hour(s))  Culture,  blood (Routine x 2)     Status: None (Preliminary result)   Collection Time: 04/11/17  2:28 PM  Result Value Ref Range Status   Specimen Description BLOOD RIGHT ANTECUBITAL  Final   Special Requests   Final    BOTTLES DRAWN AEROBIC AND ANAEROBIC Blood Culture adequate volume   Culture   Final    NO GROWTH 3 DAYS Performed at [facility], [place] [address] [place], [place] [address]    Report Status PENDING  Incomplete  Culture, blood (Routine x 2)     Status: None (Preliminary result)   Collection Time: 04/11/17  2:56 PM  Result Value Ref Range Status   Specimen Description BLOOD PORT  Final   Special Requests   Final    BOTTLES DRAWN AEROBIC AND ANAEROBIC Blood Culture adequate volume   Culture   Final    NO GROWTH 3 DAYS Performed at [facility], [address], [place], [place] [address]  Report Status PENDING  Incomplete  MRSA PCR Screening     Status: None   Collection Time: 04/11/17  7:17 PM  Result Value Ref Range Status   MRSA by PCR NEGATIVE NEGATIVE Final    Comment:        The GeneXpert MRSA Assay (FDA approved for NASAL specimens only), is one component of a comprehensive MRSA colonization surveillance program. It is not intended to diagnose MRSA infection nor to guide or monitor treatment for MRSA infections.          Radiology Studies: Dg Chest Port 1 View  Result Date: 04/14/2017 CLINICAL DATA:  Hypoxia today, breast cancer with pulmonary metastases, asthma, atrial fibrillation EXAM: PORTABLE CHEST 1 VIEW COMPARISON:  Portable exam 1414 hours compared 04/11/2017 FINDINGS: RIGHT jugular Port-A-Cath stable tip projecting over cavoatrial junction. Enlargement of cardiac silhouette. Enlarged central pulmonary arteries. Bibasilar pleural effusions and atelectasis. RIGHT upper lobe opacity question metastasis. Probable LEFT lower lobe and LEFT perihilar metastases. No pneumothorax. No definite osseous lesions. IMPRESSION: Pulmonary metastases with  bibasilar pleural effusions atelectasis. Worsened bibasilar aeration since previous exam. Electronically Signed   By: Lavonia Dana M.D.   On: 04/14/2017 15:28        Scheduled Meds: . amiodarone  200 mg Oral BID  . cefUROXime  250 mg Oral BID WC  . Chlorhexidine Gluconate Cloth  6 each Topical Q0600  . enoxaparin  1 mg/kg Subcutaneous BID  . fentaNYL  25 mcg Transdermal Q72H  . ferrous sulfate  325 mg Oral TID WC  . levalbuterol  0.63 mg Nebulization TID  . metoprolol succinate  50 mg Oral Daily  . pantoprazole  40 mg Oral Q1200  . predniSONE  50 mg Oral Q breakfast  . sodium chloride flush  10-40 mL Intracatheter Q12H   Continuous Infusions:    LOS: 4 days    Time spent: 22min    Domenic Polite, MD [facility] Pager [phone]  If 7PM-7AM, please contact night-coverage [email or web address] Password [email or web address] 04/15/2017, 11:06 AM

## 2017-04-15 NOTE — Progress Notes (Signed)
PT Cancellation Note  Patient Details Name: Cindy Bradshaw MRN: [id number] DOB: 04/08/1971   Cancelled Treatment:    Reason Eval/Treat Not Completed: Medical issues which prohibited therapy. RN checking BP and noted HR elevated as well. Will check back later today if/when able.    Weston Anna, MPT Pager: [phone]

## 2017-04-16 LAB — CULTURE, BLOOD (ROUTINE X 2)
Culture: NO GROWTH
Culture: NO GROWTH
SPECIAL REQUESTS: ADEQUATE
SPECIAL REQUESTS: ADEQUATE

## 2017-04-16 MED ORDER — POLYETHYLENE GLYCOL 3350 17 G PO PACK
17.0000 g | PACK | Freq: Every day | ORAL | Status: DC
  Administered 2017-04-16 – 2017-04-17 (×2): 17 g via ORAL
  Filled 2017-04-16 (×2): qty 1

## 2017-04-16 MED ORDER — METHOCARBAMOL 500 MG PO TABS
500.0000 mg | ORAL_TABLET | Freq: Four times a day (QID) | ORAL | Status: DC | PRN
  Administered 2017-04-16 – 2017-04-20 (×5): 500 mg via ORAL
  Filled 2017-04-16 (×5): qty 1

## 2017-04-16 MED ORDER — FUROSEMIDE 40 MG PO TABS
40.0000 mg | ORAL_TABLET | Freq: Two times a day (BID) | ORAL | Status: DC
  Administered 2017-04-16 – 2017-04-17 (×3): 40 mg via ORAL
  Filled 2017-04-16 (×3): qty 1

## 2017-04-16 MED ORDER — METOPROLOL SUCCINATE ER 50 MG PO TB24
75.0000 mg | ORAL_TABLET | Freq: Every day | ORAL | Status: DC
  Administered 2017-04-16 – 2017-04-17 (×2): 75 mg via ORAL
  Filled 2017-04-16 (×2): qty 1

## 2017-04-16 NOTE — Progress Notes (Signed)
PROGRESS NOTE    Cindy Bradshaw  TDD:[id number] DOB: 1970/11/09 DOA: 04/11/2017 PCP: [name], MD  Brief Narrative: Cindy Bradshaw. Cindy Bradshaw is a 46 y.o. female with medical history significant of metastatic breast cancer with extensive pulmonary metastases, H/o PE on full dose lovenox, nonischemic cardiomyopathy ejection fraction of 25%, paroxysmal atrial fibrillation, asthma, presented to the ED with worsening shortness of breath, pt reports that this is gradually progressive over 1-2weeks, she was just started on O2 at 2L at her last visit with [name] and given reduced dose Halaven(chemo).She also reported low grade fevers with cough for last 1-2days, temperature upto 100.9 8/9 at home and some wheezing. CT chest with worsening pulm mets and small effusions  Assessment & Plan:   Acute hypoxic respiratory failure -Multifactorial, primarily secondary to progression of her pulmonary metastatic disease, CHF also contributing -mild asthma flare/bronchitis also suspected due to reports of fever, productive cough and slight wheezing on admission, was initially on ROcephin/zithromax changed to Po Ceftin, transitioned from IV solumedrol to prednisone today -CTA chest notes progression of Pulm mets and small effusion, diuresed with IV lasix for 4days, then BP trended down hence lasix held, will resume PO today -slight improvement noted overall -Appreciate [name]'s input, prognosis is poor, [name] plans to try Immunotherapy after discharge -Physical therapy following, encourage increased ambulation  ACute on chronic systolic CHF/Nonischemic cardiomyopathy -EF 25% -Felt to be related to Adriamycin/chemotherapy -was on  IV lasix then held since BP soft, resume Po today -I/o inaccurate unfortunately, urine output not recorded for several days  Metastatic breast cancer with extensive pulmonary metastases -On palliative chemotherapy by [name] -see discussion above  H/o PE -on full  dose lovenox -stable  P.Afib -with intermittent RVR, pulm disease from mets contributing -Continue oral amiodarone and metoprolol, HR fluctuating from 90s to 110s and 120s with activity, will increase metoprolol XL dose from 50 to 75mg  -Already on full dose anticoagulation for history of PE  Chronic pain -Related to metastatic cancer  -continue fentanyl and oxycodone per home regimen, increased dose of Fentanyl dose to 60mcg  DVT prophylaxis: on full dose Lovenox Code Status:  full code Family Communication: No family at bedside Disposition Plan: home in 1-2days, when symptom burden better  Consultants:   Onc [name]   Antimicrobials:   Rocephin/Zithromax 8/9-8/11  Cefdinir 8/11   Subjective: -feels a little better, chronic pain improving some, dyspneic with activity  Objective: Vitals:   04/15/17 2300 04/16/17 0501 04/16/17 0835 04/16/17 0925  BP: 114/71 106/63  110/70  Pulse: (!) 106 85 85 (!) 130  Resp: 18 20 20    Temp: 98.3 F (36.8 C) 97.7 F (36.5 C)    TempSrc: Oral Oral    SpO2: 100% 100% 100%   Weight:  99.7 kg (219 lb 12.8 oz)    Height:        Intake/Output Summary (Last 24 hours) at 04/16/17 1220 Last data filed at 04/16/17 0800  Gross per 24 hour  Intake              880 ml  Output             1900 ml  Net            -1020 ml   Filed Weights   04/12/17 0700 04/16/17 0501  Weight: 94.3 kg (207 lb 14.3 oz) 99.7 kg (219 lb 12.8 oz)    Examination:  Gen: Obese, chronically ill after African-American female, very flat affect, appears much older than stated  age Lungs:  decreased breath sounds at both bases, improved air movement CVS: Irregularly irregular rhythm,  Abd: soft, Non tender, non distended, BS present Extremities: No Cyanosis, Clubbing or edema Skin no rashes noted   Data Reviewed:   CBC:  Recent Labs Lab 04/11/17 1421 04/12/17 0330 04/13/17 0513 04/14/17 0453 04/15/17 0447  WBC 7.5 8.1 17.4* 17.0* 13.2*  NEUTROABS  4.9  --   --   --   --   HGB 10.7* 10.3* 9.9* 9.2* 10.1*  HCT 33.8* 32.5* 31.5* 29.7* 32.1*  MCV 88.0 86.9 87.5 89.5 88.2  PLT 557* 558* 616* 560* 341*   Basic Metabolic Panel:  Recent Labs Lab 04/11/17 1421 04/12/17 0330 04/13/17 0513 04/14/17 0453 04/15/17 0447  NA 138 137 137 137 136  K 3.3* 3.7 4.8 4.7 4.8  CL 104 100* 100* 100* 102  CO2 25 25 28 30 28   GLUCOSE 107* 141* 167* 126* 140*  BUN 12 16 22* 21* 21*  CREATININE 0.82 0.82 0.78 0.71 0.71  CALCIUM 9.3 9.6 10.2 10.1 10.0   GFR: Estimated Creatinine Clearance: 105.4 mL/min (by C-G formula based on SCr of 0.71 mg/dL). Liver Function Tests:  Recent Labs Lab 04/11/17 1421 04/12/17 0330  AST 48* 50*  ALT 56* 64*  ALKPHOS 112 106  BILITOT 0.6 0.3  PROT 6.8 6.8  ALBUMIN 2.8* 2.6*   No results for input(s): LIPASE, AMYLASE in the last 168 hours. No results for input(s): AMMONIA in the last 168 hours. Coagulation Profile:  Recent Labs Lab 04/11/17 1421  INR 0.98   Cardiac Enzymes: No results for input(s): CKTOTAL, CKMB, CKMBINDEX, TROPONINI in the last 168 hours. BNP (last 3 results) No results for input(s): PROBNP in the last 8760 hours. HbA1C: No results for input(s): HGBA1C in the last 72 hours. CBG: No results for input(s): GLUCAP in the last 168 hours. Lipid Profile: No results for input(s): CHOL, HDL, LDLCALC, TRIG, CHOLHDL, LDLDIRECT in the last 72 hours. Thyroid Function Tests: No results for input(s): TSH, T4TOTAL, FREET4, T3FREE, THYROIDAB in the last 72 hours. Anemia Panel: No results for input(s): VITAMINB12, FOLATE, FERRITIN, TIBC, IRON, RETICCTPCT in the last 72 hours. Urine analysis:    Component Value Date/Time   COLORURINE YELLOW 04/11/2017 [place] 04/11/2017 1716   LABSPEC 1.006 04/11/2017 1716   LABSPEC 1.010 04/10/2016 1224   PHURINE 5.0 04/11/2017 [place] 04/11/2017 1716   GLUCOSEU Negative 04/10/2016 1224   HGBUR SMALL (A) 04/11/2017 1716     BILIRUBINUR NEGATIVE 04/11/2017 1716   BILIRUBINUR Negative 04/10/2016 1224   KETONESUR NEGATIVE 04/11/2017 1716   PROTEINUR NEGATIVE 04/11/2017 1716   UROBILINOGEN 0.2 04/10/2016 1224   NITRITE NEGATIVE 04/11/2017 1716   LEUKOCYTESUR NEGATIVE 04/11/2017 1716   LEUKOCYTESUR Negative 04/10/2016 1224   Sepsis Labs: @LABRCNTIP (procalcitonin:4,lacticidven:4)  ) Recent Results (from the past 240 hour(s))  Culture, blood (Routine x 2)     Status: None   Collection Time: 04/11/17  2:28 PM  Result Value Ref Range Status   Specimen Description BLOOD RIGHT ANTECUBITAL  Final   Special Requests   Final    BOTTLES DRAWN AEROBIC AND ANAEROBIC Blood Culture adequate volume   Culture   Final    NO GROWTH 5 DAYS Performed at [facility], [place] [address] [place], [place] [address]    Report Status 04/16/2017 FINAL  Final  Culture, blood (Routine x 2)     Status: None   Collection Time: 04/11/17  2:56 PM  Result Value Ref Range Status   Specimen Description BLOOD PORT  Final   Special Requests   Final    BOTTLES DRAWN AEROBIC AND ANAEROBIC Blood Culture adequate volume   Culture   Final    NO GROWTH 5 DAYS Performed at [facility], [address] [place], [place] [address]    Report Status 04/16/2017 FINAL  Final  MRSA PCR Screening     Status: None   Collection Time: 04/11/17  7:17 PM  Result Value Ref Range Status   MRSA by PCR NEGATIVE NEGATIVE Final    Comment:        The GeneXpert MRSA Assay (FDA approved for NASAL specimens only), is one component of a comprehensive MRSA colonization surveillance program. It is not intended to diagnose MRSA infection nor to guide or monitor treatment for MRSA infections.          Radiology Studies: Dg Chest Port 1 View  Result Date: 04/14/2017 CLINICAL DATA:  Hypoxia today, breast cancer with pulmonary metastases, asthma, atrial fibrillation EXAM: PORTABLE CHEST 1 VIEW COMPARISON:  Portable exam 1414 hours compared  04/11/2017 FINDINGS: RIGHT jugular Port-A-Cath stable tip projecting over cavoatrial junction. Enlargement of cardiac silhouette. Enlarged central pulmonary arteries. Bibasilar pleural effusions and atelectasis. RIGHT upper lobe opacity question metastasis. Probable LEFT lower lobe and LEFT perihilar metastases. No pneumothorax. No definite osseous lesions. IMPRESSION: Pulmonary metastases with bibasilar pleural effusions atelectasis. Worsened bibasilar aeration since previous exam. Electronically Signed   By: Lavonia Dana M.D.   On: 04/14/2017 15:28        Scheduled Meds: . amiodarone  200 mg Oral BID  . cefUROXime  250 mg Oral BID WC  . Chlorhexidine Gluconate Cloth  6 each Topical Q0600  . enoxaparin  1 mg/kg Subcutaneous BID  . fentaNYL  50 mcg Transdermal Q72H  . ferrous sulfate  325 mg Oral TID WC  . furosemide  40 mg Oral BID  . levalbuterol  0.63 mg Nebulization TID  . metoprolol succinate  75 mg Oral Daily  . pantoprazole  40 mg Oral Q1200  . polyethylene glycol  17 g Oral Daily  . predniSONE  50 mg Oral Q breakfast  . sodium chloride flush  10-40 mL Intracatheter Q12H   Continuous Infusions:    LOS: 5 days    Time spent: 55min    Domenic Polite, MD [facility] Pager [phone]  If 7PM-7AM, please contact night-coverage [email or web address] Password [email or web address] 04/16/2017, 12:20 PM

## 2017-04-16 NOTE — Care Management Note (Signed)
Case Management Note  Patient Details  Name: Cindy Bradshaw MRN: [id number] Date of Birth: 07-04-1971  Subjective/Objective:46 y/o f admitted w/sob. From home. Has home 02-[facility]-has travel tank. PT- recc HHPT. [facility] chosen-[facility] rep Santiago Glad aware & following. Await f50f. HHPT ordered.                    Action/Plan:d/c home w/[facility]HC.   Expected Discharge Date:   (unknown)               Expected Discharge Plan:  [place]  In-House Referral:     Discharge planning Services  CM Consult  Post Acute Care Choice:  Durable Medical Equipment (Home 02-[facility] has travel tank) Choice offered to:  Patient  DME Arranged:    DME Agency:     HH Arranged:  PT [place]:  [place]  Status of Service:  In process, will continue to follow  If discussed at Long Length of Stay Meetings, dates discussed:    Additional Comments:  Dessa Phi, RN 04/16/2017, 12:53 PM

## 2017-04-17 ENCOUNTER — Inpatient Hospital Stay (HOSPITAL_COMMUNITY): Payer: Federal, State, Local not specified - PPO

## 2017-04-17 DIAGNOSIS — I5043 Acute on chronic combined systolic (congestive) and diastolic (congestive) heart failure: Secondary | ICD-10-CM

## 2017-04-17 DIAGNOSIS — C50919 Malignant neoplasm of unspecified site of unspecified female breast: Secondary | ICD-10-CM

## 2017-04-17 DIAGNOSIS — R570 Cardiogenic shock: Secondary | ICD-10-CM

## 2017-04-17 DIAGNOSIS — R0603 Acute respiratory distress: Secondary | ICD-10-CM

## 2017-04-17 DIAGNOSIS — I420 Dilated cardiomyopathy: Secondary | ICD-10-CM

## 2017-04-17 DIAGNOSIS — J9601 Acute respiratory failure with hypoxia: Secondary | ICD-10-CM

## 2017-04-17 DIAGNOSIS — I4891 Unspecified atrial fibrillation: Secondary | ICD-10-CM

## 2017-04-17 LAB — BASIC METABOLIC PANEL
ANION GAP: 8 (ref 5–15)
BUN: 18 mg/dL (ref 6–20)
CO2: 30 mmol/L (ref 22–32)
CREATININE: 0.63 mg/dL (ref 0.44–1.00)
Calcium: 9.8 mg/dL (ref 8.9–10.3)
Chloride: 95 mmol/L — ABNORMAL LOW (ref 101–111)
Glucose, Bld: 95 mg/dL (ref 65–99)
POTASSIUM: 4.2 mmol/L (ref 3.5–5.1)
Sodium: 133 mmol/L — ABNORMAL LOW (ref 135–145)

## 2017-04-17 LAB — CBC
HEMATOCRIT: 33.3 % — AB (ref 36.0–46.0)
HEMOGLOBIN: 10.6 g/dL — AB (ref 12.0–15.0)
MCH: 28.2 pg (ref 26.0–34.0)
MCHC: 31.8 g/dL (ref 30.0–36.0)
MCV: 88.6 fL (ref 78.0–100.0)
Platelets: 561 10*3/uL — ABNORMAL HIGH (ref 150–400)
RBC: 3.76 MIL/uL — ABNORMAL LOW (ref 3.87–5.11)
RDW: 18.8 % — ABNORMAL HIGH (ref 11.5–15.5)
WBC: 10 10*3/uL (ref 4.0–10.5)

## 2017-04-17 LAB — GLUCOSE, CAPILLARY: GLUCOSE-CAPILLARY: 143 mg/dL — AB (ref 65–99)

## 2017-04-17 MED ORDER — NOREPINEPHRINE BITARTRATE 1 MG/ML IV SOLN
2.0000 ug/min | INTRAVENOUS | Status: DC
  Filled 2017-04-17: qty 4

## 2017-04-17 MED ORDER — AMIODARONE HCL IN DEXTROSE 360-4.14 MG/200ML-% IV SOLN
60.0000 mg/h | INTRAVENOUS | Status: DC
  Administered 2017-04-17 (×2): 60 mg/h via INTRAVENOUS
  Filled 2017-04-17 (×3): qty 200

## 2017-04-17 MED ORDER — AMIODARONE LOAD VIA INFUSION
150.0000 mg | Freq: Once | INTRAVENOUS | Status: AC
  Administered 2017-04-17: 150 mg via INTRAVENOUS
  Filled 2017-04-17: qty 83.34

## 2017-04-17 MED ORDER — FUROSEMIDE 10 MG/ML IJ SOLN
20.0000 mg | Freq: Four times a day (QID) | INTRAMUSCULAR | Status: DC
  Administered 2017-04-17 – 2017-04-18 (×4): 20 mg via INTRAVENOUS
  Filled 2017-04-17 (×4): qty 2

## 2017-04-17 MED ORDER — MORPHINE SULFATE (PF) 2 MG/ML IV SOLN
1.0000 mg | INTRAVENOUS | Status: DC | PRN
  Administered 2017-04-17 – 2017-04-20 (×10): 2 mg via INTRAVENOUS
  Filled 2017-04-17 (×10): qty 1

## 2017-04-17 MED ORDER — SENNA 8.6 MG PO TABS
2.0000 | ORAL_TABLET | Freq: Every day | ORAL | Status: DC
Start: 2017-04-17 — End: 2017-04-27
  Administered 2017-04-17 – 2017-04-27 (×9): 17.2 mg via ORAL
  Filled 2017-04-17 (×9): qty 2

## 2017-04-17 MED ORDER — METOPROLOL SUCCINATE ER 25 MG PO TB24
25.0000 mg | ORAL_TABLET | Freq: Every day | ORAL | Status: DC
  Administered 2017-04-18 – 2017-04-25 (×8): 25 mg via ORAL
  Filled 2017-04-17 (×8): qty 1

## 2017-04-17 MED ORDER — GUAIFENESIN-DM 100-10 MG/5ML PO SYRP
5.0000 mL | ORAL_SOLUTION | ORAL | Status: DC | PRN
  Administered 2017-04-17 – 2017-04-26 (×16): 5 mL via ORAL
  Filled 2017-04-17 (×15): qty 10

## 2017-04-17 MED ORDER — AMIODARONE HCL IN DEXTROSE 360-4.14 MG/200ML-% IV SOLN
30.0000 mg/h | INTRAVENOUS | Status: DC
  Administered 2017-04-17: 30 mg/h via INTRAVENOUS
  Administered 2017-04-17: 59.94 mg/h via INTRAVENOUS
  Administered 2017-04-18 – 2017-04-21 (×8): 30 mg/h via INTRAVENOUS
  Filled 2017-04-17 (×9): qty 200

## 2017-04-17 NOTE — Progress Notes (Signed)
PT Cancellation Note  Patient Details Name: Cindy Bradshaw MRN: [id number] DOB: 08/24/1971   Cancelled Treatment:    Reason Eval/Treat Not Completed: Medical issues which prohibited therapy--pt just transferred to ICU. Will check back another day. Will also await palliative care recommendations.    Weston Anna, MPT Pager: [phone]

## 2017-04-17 NOTE — Consult Note (Signed)
Cardiology Consultation:   Patient ID: Cindy Bradshaw; [id number]; 1970-10-14   Admit date: 04/11/2017 Date of Consult: 04/17/2017  Primary Care Provider: [name], MD Primary Cardiologist: Dr Cindy Bradshaw and Dr. Haroldine Bradshaw for heart failure   Patient Profile:   Cindy Bradshaw is a 46 y.o. female with a hx of Metastatic breast cancer with extensive pulmonary metastases, history of recent PE on full dose Lovenox, NICM with EF 25%, PAF, asthma who is being seen today for the evaluation of atrial fibrillation at the request of Dr Cindy Bradshaw.  History of Present Illness:   Cindy Bradshaw presented to the [facility] emergency department on 04/11/17 for evaluation of worsening shortness of breath. She had had increasing shortness of breath over the previous one to 2 weeks and had just been started on home oxygen at 2 L at her last visit with Cindy Bradshaw. HerHalaven (chemo) dose was also reduced. She had had a fever and cough for several days. CT of the chest showed worsening pulmonary metastases and small effusions.  The patient recently developed atrial fibrillation in 02/11/2017 and was also noted to have systolic dysfunction with EF 25-30%. The patient was discharged from the hospital on amiodarone, Coreg and Xarelto and was in sinus rhythm. She was then noted to be back in atrial fibrillation with rates in the 130s at oncology follow-up on 02/16/17. She was asymptomatic and continued on amiodarone 400 mg twice a day and carvedilol was increased to 6.25 mg.  She was seen in follow-up by Cindy Bradshaw on 02/21/17 and noted to be in sinus rhythm. ReDS vest showed mildly increased lung water. Chest x-ray showed no edema. She was placed on Lasix 40 mg daily. She was established at the heart failure clinic on 03/05/17. Her weight was down 8 pounds and she was maintaining sinus rhythm by EKG. She was continued on amiodarone 200 mg twice a day.  She is now in atrial fibrillation with rates in the 120's. She is  short of breath, has a cough, has right sided chest sharp/tightness that has been constant since her presentation.   BNP  225.9 SCr: 0.63,  K+ 4.2 Hgb 10.6 CXR: Cardiomegaly with vascular congestion. Suspect small layering effusions. Bibasilar atelectasis or infiltrates. Nodular airspace opacities compatible with known metastatic disease. No real change since prior study.  Past Medical History:  Diagnosis Date  . Arthritis   . Asthma   . Back disorder    degenerative disk disease  . Breast cancer ([place])   . Cancer ([place])   . Cardiomyopathy ([place])    a. Dx 02/2017 - EF 25-30%, felt possibly due to adriamycin +/- tachy mediated from atrial fib/flutter.  Marland Kitchen GERD (gastroesophageal reflux disease)   . Mammogram abnormal 08/24/15   first  . Migraine   . Normal coronary arteries    01/2016 cath  . PAF (paroxysmal atrial fibrillation) ([place])   . Pap smear for cervical cancer screening 08/12/15  . Paroxysmal atrial flutter [facility])     Past Surgical History:  Procedure Laterality Date  . BREAST LUMPECTOMY WITH RADIOACTIVE SEED AND SENTINEL LYMPH NODE BIOPSY Left 02/13/2016   Procedure: BREAST LUMPECTOMY WITH RADIOACTIVE SEED AND SENTINEL LYMPH NODE BIOPSY;  Surgeon: Cindy Seltzer, MD;  Location: [place];  Service: General;  Laterality: Left;  . BREAST REDUCTION SURGERY Bilateral 02/21/2016   Procedure: MAMMARY REDUCTION  (BREAST)/Oncoplastic breast reconstruction;  Surgeon: Cindy Limbo, MD;  Location: [place];  Service: Plastics;  Laterality: Bilateral;  . CARDIAC  CATHETERIZATION [name] 01/24/2016   Procedure: Left Heart Cath and Coronary Angiography;  Surgeon: Cindy Man, MD;  Location: [place] CV LAB;  Service: Cardiovascular;  Laterality: [name];  . CESAREAN SECTION    . CHOLECYSTECTOMY    . IR FLUORO GUIDE CV LINE RIGHT  02/11/2017  . IR FLUORO GUIDE PORT INSERTION RIGHT  03/19/2017  . IR US GUIDE VASC ACCESS RIGHT  02/11/2017  . IR US GUIDE VASC  ACCESS RIGHT  03/19/2017  . PERIPHERAL VASCULAR CATHETERIZATION Right 02/13/2016   Procedure: PORTA CATH REMOVAL;  Surgeon: Cindy Seltzer, MD;  Location: [place];  Service: General;  Laterality: Right;  . PORTACATH PLACEMENT [name] 09/12/2015   Procedure: INSERTION PORT-A-CATH;  Surgeon: Cindy Seltzer, MD;  Location: [facility];  Service: General;  Laterality: [name];     Inpatient Medications: Scheduled Meds: . amiodarone  200 mg Oral BID  . cefUROXime  250 mg Oral BID WC  . Chlorhexidine Gluconate Cloth  6 each Topical Q0600  . enoxaparin  1 mg/kg Subcutaneous BID  . fentaNYL  50 mcg Transdermal Q72H  . ferrous sulfate  325 mg Oral TID WC  . furosemide  40 mg Oral BID  . levalbuterol  0.63 mg Nebulization TID  . metoprolol succinate  75 mg Oral Daily  . pantoprazole  40 mg Oral Q1200  . polyethylene glycol  17 g Oral Daily  . predniSONE  50 mg Oral Q breakfast  . senna  2 tablet Oral Daily  . sodium chloride flush  10-40 mL Intracatheter Q12H   Continuous Infusions:  PRN Meds: acetaminophen **OR** acetaminophen, albuterol, ALPRAZolam, alum & mag hydroxide-simeth, diphenhydrAMINE, methocarbamol, morphine, morphine injection, ondansetron **OR** ondansetron (ZOFRAN) IV, sodium chloride flush  Allergies:    Allergies  Allergen Reactions  . Dilaudid [Hydromorphone Hcl] Itching  . Ivp Dye [Iodinated Diagnostic Agents] Itching  . Percocet [Oxycodone-Acetaminophen] Itching  . Tape Itching and Rash    Hypofix causes rash  . Toradol [Ketorolac Tromethamine] Itching    Social History:   Social History   Social History  . Marital status: Legally Separated    Spouse name: Cindy Bradshaw  . Number of children: 2  . Years of education: [name]   Occupational History  . Web designer    Social History Main Topics  . Smoking status: Never Smoker  . Smokeless tobacco: Never Used  . Alcohol use 0.0 oz/week     Comment: occassional glass of wine  . Drug use: No  .  Sexual activity: Yes   Other Topics Concern  . Not on file   Social History Narrative   ** Merged History Encounter **       First MP age 74.  LMP 08/26/15.     2 children carried to term.  Age at first live birth 80   H/o oral contrasception use       Family History:    Family History  Problem Relation Age of Onset  . Lung cancer Mother 51       2 different types of lung cancer; metastasis to brain; smoker  . Other Mother        hx of hysterectomy for unspecified reason  . Bone cancer Maternal Uncle        dx. early 29s  . Breast cancer Maternal Grandmother        dx. early 70s, s/p mastectomy  . Cancer Paternal Grandfather        unspecified type of cancer, dx. late 16s  .  Congestive Heart Failure Father        smoker  . Stroke Father   . Eczema Father   . Cirrhosis Maternal Grandfather   . Heart Problems Maternal Grandfather   . Asthma Daughter   . Allergies Daughter        hives  . Lung cancer Other        (maternal great uncle; MGM's brother); had a coal stove  . Cancer Cousin        unspecified type; d. early age (paternal first cousin once-removed)  . Cancer Cousin        dx. as a kid; in remission today; (paternal 2nd cousin)  . Cancer Cousin        unspecified type; d. early 10s; (maternal 1st cousin)     ROS:  Please see the history of present illness.  ROS  All other ROS reviewed and negative.     Physical Exam/Data:   Vitals:   04/17/17 0721 04/17/17 0750 04/17/17 0947 04/17/17 1152  BP: (!) 101/49  (!) 109/54 (!) 105/57  Pulse: (!) 101   (!) 115  Resp:    (!) 22  Temp:    99.7 F (37.6 C)  TempSrc:    Oral  SpO2:  92%  98%  Weight:      Height:        Intake/Output Summary (Last 24 hours) at 04/17/17 1201 Last data filed at 04/17/17 1153  Gross per 24 hour  Intake              490 ml  Output             1602 ml  Net            -1112 ml   Filed Weights   04/12/17 0700 04/16/17 0501  Weight: 207 lb 14.3 oz (94.3 kg) 219 lb 12.8 oz  (99.7 kg)   Body mass index is 34.43 kg/m.  General:  Well nourished, well developed, visibly short of breath with conversation  HEENT: normal Lymph: no adenopathy Neck: no JVD Endocrine:  No thryomegaly Vascular: No carotid bruits; FA pulses 2+ bilaterally without bruits  Cardiac:  normal S1, S2; Irregularly irregular rhythm Lungs:  clear to auscultation bilaterally, no wheezing, rhonchi or rales  Abd: soft, nontender, no hepatomegaly  Ext: no edema Musculoskeletal:  No deformities, BUE and BLE strength normal and equal Skin: warm and dry  Neuro:  CNs 2-12 intact, no focal abnormalities noted Psych:  Normal affect   EKG:  The EKG was personally reviewed and demonstrates:   04/11/17 1407: atrial fibrillation , 152 bpm 04/11/17 1601:  NSR at 93 bpm  Telemetry:  Telemetry was personally reviewed and demonstrates:  Atrial fibrillation in the 120's  Relevant CV Studies:  Echocardiogram 02/12/17: Study Conclusions - Left ventricle: The cavity size was severely dilated. Wall thickness was normal. Systolic function was severely reduced. The estimated ejection fraction was in the range of 25% to 30%. Diffuse hypokinesis. The study is not technically sufficient to allow evaluation of LV diastolic function. - Mitral valve: There was mild regurgitation. - Left atrium: The atrium was moderately dilated. - Right atrium: The atrium was mildly dilated.  Echocardiogram 09/13/15: Study Conclusions - Left ventricle: The cavity size was normal. There was mild focal basal hypertrophy of the septum. Systolic function was normal. The estimated ejection fraction was in the range of 55% to 60%. Wall motion was normal; there were no regional wall motion abnormalities. Global longitudinal strain: -  18.4% There was no evidence of elevated ventricular filling pressure by Doppler parameters.   Left heart cath 01/24/16: 1. Angiographically normal coronary arteries 2. The left  ventricular systolic function is normal. Essentially normal coronary arteries with no significant coronary disease noted to explain abnormal stress test.  Suspect breast attenuation is the explanation for the false-positive stress test. I suspect noncardiac chest pain  Myoview 01/23/16: 1. Positive for moderate size region of reversible ischemia in the anteroseptal wall of the mid ventricle extending through the apex to the true apex. 2. Normal left ventricular wall motion. 3. Left ventricular ejection fraction 54% 4. Non invasive risk stratification*: Intermediate  Laboratory Data:  Chemistry Recent Labs Lab 04/14/17 0453 04/15/17 0447 04/17/17 0436  NA 137 136 133*  K 4.7 4.8 4.2  CL 100* 102 95*  CO2 30 28 30   GLUCOSE 126* 140* 95  BUN 21* 21* 18  CREATININE 0.71 0.71 0.63  CALCIUM 10.1 10.0 9.8  GFRNONAA >60 >60 >60  GFRAA >60 >60 >60  ANIONGAP 7 6 8      Recent Labs Lab 04/11/17 1421 04/12/17 0330  PROT 6.8 6.8  ALBUMIN 2.8* 2.6*  AST 48* 50*  ALT 56* 64*  ALKPHOS 112 106  BILITOT 0.6 0.3   Hematology Recent Labs Lab 04/14/17 0453 04/15/17 0447 04/17/17 0436  WBC 17.0* 13.2* 10.0  RBC 3.32* 3.64* 3.76*  HGB 9.2* 10.1* 10.6*  HCT 29.7* 32.1* 33.3*  MCV 89.5 88.2 88.6  MCH 27.7 27.7 28.2  MCHC 31.0 31.5 31.8  RDW 18.9* 18.9* 18.8*  PLT 560* 587* 561*   Cardiac EnzymesNo results for input(s): TROPONINI in the last 168 hours.  Recent Labs Lab 04/11/17 1435  TROPIPOC 0.01    BNP Recent Labs Lab 04/11/17 1421  BNP 225.9*    DDimer No results for input(s): DDIMER in the last 168 hours.  Radiology/Studies:  Dg Chest 2 View  Result Date: 04/17/2017 CLINICAL DATA:  History of metastatic breast cancer. Shortness of breath. EXAM: CHEST  2 VIEW COMPARISON:  04/14/2017, CT 04/12/2017 FINDINGS: Cardiomegaly with vascular congestion. Suspect small layering effusions. Bibasilar atelectasis or infiltrates. Nodular airspace opacities compatible with  known metastatic disease. No real change since prior study. IMPRESSION: No significant change since prior study. Electronically Signed   By: Rolm Baptise M.D.   On: 04/17/2017 10:30   Dg Chest Port 1 View  Result Date: 04/14/2017 CLINICAL DATA:  Hypoxia today, breast cancer with pulmonary metastases, asthma, atrial fibrillation EXAM: PORTABLE CHEST 1 VIEW COMPARISON:  Portable exam 1414 hours compared 04/11/2017 FINDINGS: RIGHT jugular Port-A-Cath stable tip projecting over cavoatrial junction. Enlargement of cardiac silhouette. Enlarged central pulmonary arteries. Bibasilar pleural effusions and atelectasis. RIGHT upper lobe opacity question metastasis. Probable LEFT lower lobe and LEFT perihilar metastases. No pneumothorax. No definite osseous lesions. IMPRESSION: Pulmonary metastases with bibasilar pleural effusions atelectasis. Worsened bibasilar aeration since previous exam. Electronically Signed   By: Lavonia Dana M.D.   On: 04/14/2017 15:28    Assessment and Plan:   Systolic heart failure -EF 25-30% -Suspected to be tachycardia induced from atrial fibrillation and it was hoped that this will improve with maintenance of sinus rhythm -BNP on admission was 226.  -Does not appear significantly volume overloaded. Pt is short of breath and unclear if due to CHF or afib with RVR. -Wt was 199 on 7/3. Today is 219. (was 207 on admission) -Pt was diuresed with IV lasix for 4 days then BP trended down. Lasix was held, but is  now resumed with oral dosing.   -Discussed with Dr. Meda Coffee. Will transfer to ICU for levophed for BP support so can diurese more.   Atrial fibrillation -Patient first noted to be in atrial fibrillation in 02/2017. She has been maintained in sinus rhythm on amiodarone since that time -Has been intermittently in afib since admission, Currently is in sustained atrial fibrillation in the 120s -She is feeling very weak and short of breath. -This patients CHA2DS2-VASc Score and  unadjusted Ischemic Stroke Rate (% per year) is equal to 3.2 % stroke rate/year from a score of 3 [CHF, HTN, Female] -The patient was initially anticoagulated with Xarelto at home, however she developed a PE (02/23/17) and is now on full dose Lovenox -Currently on her home dose of amiodarone of 200 mg bid, not on carvedilol, is on metoprolol succinate, increased from 50 mg to 75 mg yesterday.  -Blood pressure is soft 90's-low 100's. -Echo in June showed moderately dilated Left atrium.  -Plan for cardioversion. Earliest available is Friday. Pt has been on amiodarone since June 11. -Change to amiodarone drip.   Acute hypoxic respiratory failure -Per IM, multifactorial, primarily secondary to progression of her pulmonary metastatic disease. CHF and atrial fib with RVR likely also contributing.  -CT of the chest showed progression of pulmonary mets and small effusion. Was being diuresed with lasix but this had to be scaled back due to low BP.   Metastatic breast cancer with extensive pulmonary metastasis -On palliative chemotherapy -Per Cindy Bradshaw, the pt prognosis is poor. Palliative care has been consulted.    Signed, Daune Perch, NP  04/17/2017 12:01 PM  The patient was seen, examined and discussed with Daune Perch, NP-C and I agree with the above.    46 y.o. unfortunate female with a hx of Metastatic breast cancer with extensive pulmonary metastases, history of recent PE on 6/23, now on full dose Lovenox, NICM with EF 25%, PAF, asthma who is being seen today for the evaluation of atrial fibrillation at the request of Dr Cindy Bradshaw. She was admitted with atrial fibrillation with RVR and acute on chronic combined systolic and diastolic CHF, LVEF 37-85% in 4/18 from 65-70% in 09/2015, etiology adriamycin vs tachy induced, possibly combination of both.  Today she is in cardiogenic shock, hypotensive, tachycardic, severely fluid overloaded, diaphoretic, I will start levophed drip in order to be  able to diurese, start lasix 20 mg iv Q6H, increase to 40 mg iv Q6H once BP improved, also switch amiodarone PO to iv form.  She is on palliative chemo, long tern plan yet to be establish, however this young lady is very symptomatic, uncomfortable and we need to do all possible to make her comfortable unless determined differently.  Ena Dawley, MD 04/17/2017

## 2017-04-17 NOTE — Progress Notes (Signed)
Report called to [place], Therapist, sports.  Pt transported via bed.

## 2017-04-17 NOTE — Progress Notes (Signed)
PROGRESS NOTE    Cindy Bradshaw  QTM:[id number] DOB: 09/08/1970 DOA: 04/11/2017 PCP: [name], MD  Brief Narrative: Cindy Bradshaw is a 46 y.o. female with medical history significant of metastatic breast cancer with extensive pulmonary metastases, H/o PE on full dose lovenox, nonischemic cardiomyopathy ejection fraction of 25%, paroxysmal atrial fibrillation, asthma, presented to the ED with worsening shortness of breath, pt reports that this is gradually progressive over 1-2weeks, she was just started on O2 at 2L at her last visit with [name] and given reduced dose [place](chemo).She also reported low grade fevers with cough for last 1-2days, temperature upto 100.9 8/9 at home and some wheezing. CT chest with worsening pulm mets and small effusions  Assessment & Plan:   Acute hypoxic respiratory failure -Multifactorial, primarily secondary to progression of her pulmonary metastatic disease, CHF also contributing -mild asthma flare/bronchitis also suspected due to reports of fever, productive cough and slight wheezing on admission, was initially on ROcephin/zithromax changed to Po Ceftin, transitioned from IV solumedrol to prednisone today -CTA chest notes progression of Pulm mets and small effusion, diuresed with IV lasix for 4days, then BP trended down hence lasix held, will resume PO today -slight improvement noted overall -Appreciate [name]'s input, prognosis is poor, [name] plans to try Immunotherapy after discharge -Dyspnea persist, limitation with lasix due to soft SBP. Will repeat chest x ray to assess pleural effusion. If significant pleural effusion will aks IR for thoracentesis.  -will consult palliative care for Goals of care and symptoms management.  -will consult Cardiology.   Acute on chronic systolic CHF/Nonischemic cardiomyopathy -EF 25% -Felt to be related to Adriamycin/chemotherapy -was on  IV lasix then held since BP soft, resume Po 4-14 -repeat  chest x ray, patient still with dyspnea, on oral lasix, SBP soft.   Metastatic breast cancer with extensive pulmonary metastases -On palliative chemotherapy by [name] -Palliative care consult for goals of care and symptoms management.   H/o PE -on full dose lovenox -stable  P.Afib -with intermittent RVR, pulm disease from mets contributing -Continue oral amiodarone and metoprolol. HR still not controlled, this might play a role in her dyspnea, HF exacerbation. Will consult cardiology.  -Already on full dose anticoagulation for history of PE  Chronic pain -Related to metastatic cancer  -continue fentanyl and oxycodone per home regimen, increased dose of Fentanyl dose to 51mcg  DVT prophylaxis: on full dose Lovenox Code Status:  full code Family Communication: No family at bedside Disposition Plan: home in 1-2days, when symptom burden better  Consultants:   Onc [name]   Antimicrobials:   Rocephin/Zithromax 8/9-8/11  Cefdinir 8/11   Subjective: Patient complaining of chest pain, worse when she takes deep breath.  She is having SOB, is not better. She feels SOB even talking.  She complaints of cramps all over.  She agrees to speak with palliative for goals of care and symptoms management.   Objective: Vitals:   04/17/17 0155 04/17/17 0640 04/17/17 0721 04/17/17 0750  BP: (!) 100/57 (!) 148/133 (!) 101/49   Pulse:  95 (!) 101   Resp:  20    Temp:  98.9 F (37.2 C)    TempSrc:  Oral    SpO2:  99%  92%  Weight:      Height:        Intake/Output Summary (Last 24 hours) at 04/17/17 0943 Last data filed at 04/17/17 0640  Gross per 24 hour  Intake  490 ml  Output             1002 ml  Net             -512 ml   Filed Weights   04/12/17 0700 04/16/17 0501  Weight: 94.3 kg (207 lb 14.3 oz) 99.7 kg (219 lb 12.8 oz)    Examination:  Gen: Obese, chronically ill patient, in mild distress.  Lungs: Decreased breath sounds, no wheezing.  CVS:  IRR Abd; soft, nt, nd Extremities:  No cyanosis, no edema.  Skin no rashes noted   Data Reviewed:   CBC:  Recent Labs Lab 04/11/17 1421 04/12/17 0330 04/13/17 0513 04/14/17 0453 04/15/17 0447 04/17/17 0436  WBC 7.5 8.1 17.4* 17.0* 13.2* 10.0  NEUTROABS 4.9  --   --   --   --   --   HGB 10.7* 10.3* 9.9* 9.2* 10.1* 10.6*  HCT 33.8* 32.5* 31.5* 29.7* 32.1* 33.3*  MCV 88.0 86.9 87.5 89.5 88.2 88.6  PLT 557* 558* 616* 560* 587* 944*   Basic Metabolic Panel:  Recent Labs Lab 04/12/17 0330 04/13/17 0513 04/14/17 0453 04/15/17 0447 04/17/17 0436  NA 137 137 137 136 133*  K 3.7 4.8 4.7 4.8 4.2  CL 100* 100* 100* 102 95*  CO2 25 28 30 28 30   GLUCOSE 141* 167* 126* 140* 95  BUN 16 22* 21* 21* 18  CREATININE 0.82 0.78 0.71 0.71 0.63  CALCIUM 9.6 10.2 10.1 10.0 9.8   GFR: Estimated Creatinine Clearance: 105.4 mL/min (by C-G formula based on SCr of 0.63 mg/dL). Liver Function Tests:  Recent Labs Lab 04/11/17 1421 04/12/17 0330  AST 48* 50*  ALT 56* 64*  ALKPHOS 112 106  BILITOT 0.6 0.3  PROT 6.8 6.8  ALBUMIN 2.8* 2.6*   No results for input(s): LIPASE, AMYLASE in the last 168 hours. No results for input(s): AMMONIA in the last 168 hours. Coagulation Profile:  Recent Labs Lab 04/11/17 1421  INR 0.98   Cardiac Enzymes: No results for input(s): CKTOTAL, CKMB, CKMBINDEX, TROPONINI in the last 168 hours. BNP (last 3 results) No results for input(s): PROBNP in the last 8760 hours. HbA1C: No results for input(s): HGBA1C in the last 72 hours. CBG: No results for input(s): GLUCAP in the last 168 hours. Lipid Profile: No results for input(s): CHOL, HDL, LDLCALC, TRIG, CHOLHDL, LDLDIRECT in the last 72 hours. Thyroid Function Tests: No results for input(s): TSH, T4TOTAL, FREET4, T3FREE, THYROIDAB in the last 72 hours. Anemia Panel: No results for input(s): VITAMINB12, FOLATE, FERRITIN, TIBC, IRON, RETICCTPCT in the last 72 hours. Urine analysis:    Component  Value Date/Time   COLORURINE YELLOW 04/11/2017 [place] 04/11/2017 1716   LABSPEC 1.006 04/11/2017 1716   LABSPEC 1.010 04/10/2016 1224   PHURINE 5.0 04/11/2017 [place] 04/11/2017 1716   GLUCOSEU Negative 04/10/2016 1224   HGBUR SMALL (A) 04/11/2017 1716   BILIRUBINUR NEGATIVE 04/11/2017 1716   BILIRUBINUR Negative 04/10/2016 [place] 04/11/2017 1716   PROTEINUR NEGATIVE 04/11/2017 1716   UROBILINOGEN 0.2 04/10/2016 1224   NITRITE NEGATIVE 04/11/2017 1716   LEUKOCYTESUR NEGATIVE 04/11/2017 1716   LEUKOCYTESUR Negative 04/10/2016 1224   Sepsis Labs: @LABRCNTIP (procalcitonin:4,lacticidven:4)  ) Recent Results (from the past 240 hour(s))  Culture, blood (Routine x 2)     Status: None   Collection Time: 04/11/17  2:28 PM  Result Value Ref Range Status   Specimen Description BLOOD RIGHT ANTECUBITAL  Final  Special Requests   Final    BOTTLES DRAWN AEROBIC AND ANAEROBIC Blood Culture adequate volume   Culture   Final    NO GROWTH 5 DAYS Performed at [facility], [place] [address], [place], [place] [address]    Report Status 04/16/2017 FINAL  Final  Culture, blood (Routine x 2)     Status: None   Collection Time: 04/11/17  2:56 PM  Result Value Ref Range Status   Specimen Description BLOOD PORT  Final   Special Requests   Final    BOTTLES DRAWN AEROBIC AND ANAEROBIC Blood Culture adequate volume   Culture   Final    NO GROWTH 5 DAYS Performed at [facility], [address], [place], [place] [address]    Report Status 04/16/2017 FINAL  Final  MRSA PCR Screening     Status: None   Collection Time: 04/11/17  7:17 PM  Result Value Ref Range Status   MRSA by PCR NEGATIVE NEGATIVE Final    Comment:        The GeneXpert MRSA Assay (FDA approved for NASAL specimens only), is one component of a comprehensive MRSA colonization surveillance program. It is not intended to diagnose MRSA infection nor to guide  or monitor treatment for MRSA infections.          Radiology Studies: No results found.      Scheduled Meds: . amiodarone  200 mg Oral BID  . cefUROXime  250 mg Oral BID WC  . Chlorhexidine Gluconate Cloth  6 each Topical Q0600  . enoxaparin  1 mg/kg Subcutaneous BID  . fentaNYL  50 mcg Transdermal Q72H  . ferrous sulfate  325 mg Oral TID WC  . furosemide  40 mg Oral BID  . levalbuterol  0.63 mg Nebulization TID  . metoprolol succinate  75 mg Oral Daily  . pantoprazole  40 mg Oral Q1200  . polyethylene glycol  17 g Oral Daily  . predniSONE  50 mg Oral Q breakfast  . sodium chloride flush  10-40 mL Intracatheter Q12H   Continuous Infusions:    LOS: 6 days    Time spent: 52min    Marquis Down, Md [facility] Pager [phone]  If 7PM-7AM, please contact night-coverage [email or web address] Password [facility] [place] 04/17/2017, 9:43 AM

## 2017-04-18 DIAGNOSIS — Z7189 Other specified counseling: Secondary | ICD-10-CM

## 2017-04-18 DIAGNOSIS — I5043 Acute on chronic combined systolic (congestive) and diastolic (congestive) heart failure: Secondary | ICD-10-CM

## 2017-04-18 DIAGNOSIS — Z515 Encounter for palliative care: Secondary | ICD-10-CM

## 2017-04-18 DIAGNOSIS — G893 Neoplasm related pain (acute) (chronic): Secondary | ICD-10-CM

## 2017-04-18 LAB — BASIC METABOLIC PANEL
ANION GAP: 7 (ref 5–15)
BUN: 20 mg/dL (ref 6–20)
CALCIUM: 9.9 mg/dL (ref 8.9–10.3)
CHLORIDE: 92 mmol/L — AB (ref 101–111)
CO2: 33 mmol/L — AB (ref 22–32)
Creatinine, Ser: 0.71 mg/dL (ref 0.44–1.00)
GFR calc non Af Amer: >60 mL/min (ref >60)
GLUCOSE: 99 mg/dL (ref 65–99)
Potassium: 4.1 mmol/L (ref 3.5–5.1)
Sodium: 132 mmol/L — ABNORMAL LOW (ref 135–145)

## 2017-04-18 LAB — CBC
HEMATOCRIT: 32 % — AB (ref 36.0–46.0)
HEMOGLOBIN: 10.1 g/dL — AB (ref 12.0–15.0)
MCH: 27.5 pg (ref 26.0–34.0)
MCHC: 31.6 g/dL (ref 30.0–36.0)
MCV: 87.2 fL (ref 78.0–100.0)
Platelets: 514 10*3/uL — ABNORMAL HIGH (ref 150–400)
RBC: 3.67 MIL/uL — ABNORMAL LOW (ref 3.87–5.11)
RDW: 18.7 % — AB (ref 11.5–15.5)
WBC: 9.9 10*3/uL (ref 4.0–10.5)

## 2017-04-18 MED ORDER — NALOXONE HCL 0.4 MG/ML IJ SOLN
0.4000 mg | INTRAMUSCULAR | Status: DC | PRN
Start: 2017-04-18 — End: 2017-04-20

## 2017-04-18 MED ORDER — FUROSEMIDE 10 MG/ML IJ SOLN
80.0000 mg | Freq: Once | INTRAMUSCULAR | Status: AC
  Administered 2017-04-18: 80 mg via INTRAVENOUS
  Filled 2017-04-18: qty 8

## 2017-04-18 MED ORDER — PREMIER PROTEIN SHAKE
11.0000 [oz_av] | Freq: Two times a day (BID) | ORAL | Status: DC
  Administered 2017-04-18 – 2017-04-27 (×11): 11 [oz_av] via ORAL
  Filled 2017-04-18 (×19): qty 325.31

## 2017-04-18 MED ORDER — DEXTROSE 5 % IV SOLN
2.0000 ug/min | INTRAVENOUS | Status: DC
  Filled 2017-04-18: qty 4

## 2017-04-18 MED ORDER — SODIUM CHLORIDE 0.9% FLUSH: 9.0000 mL | INTRAVENOUS | Status: DC | PRN

## 2017-04-18 MED ORDER — FENTANYL 40 MCG/ML IV SOLN
INTRAVENOUS | Status: DC
  Administered 2017-04-18: 30 ug via INTRAVENOUS
  Administered 2017-04-18: 14:00:00 via INTRAVENOUS
  Administered 2017-04-19: 60 ug via INTRAVENOUS
  Administered 2017-04-19: 20 ug via INTRAVENOUS
  Filled 2017-04-18: qty 25

## 2017-04-18 MED ORDER — FUROSEMIDE 10 MG/ML IJ SOLN
40.0000 mg | Freq: Four times a day (QID) | INTRAMUSCULAR | Status: DC
  Administered 2017-04-18 – 2017-04-19 (×3): 40 mg via INTRAVENOUS
  Filled 2017-04-18 (×3): qty 4

## 2017-04-18 MED ORDER — SODIUM CHLORIDE 0.9 % IV SOLN
8.0000 mg | Freq: Four times a day (QID) | INTRAVENOUS | Status: DC
  Administered 2017-04-18 – 2017-04-24 (×24): 8 mg via INTRAVENOUS
  Filled 2017-04-18 (×30): qty 4

## 2017-04-18 MED ORDER — POLYETHYLENE GLYCOL 3350 17 G PO PACK
17.0000 g | PACK | Freq: Every day | ORAL | Status: DC
  Administered 2017-04-19: 17 g via ORAL
  Filled 2017-04-18 (×2): qty 1

## 2017-04-18 NOTE — Care Management Note (Signed)
Case Management Note  Patient Details  Name: Cindy Bradshaw MRN: [id number] Date of Birth: [birth year]  Subjective/Objective:  Transferred to sdu due to a.fib with rvr and placed on iv amiodarone drip pm of [id number]                  Action/Plan: Date:  April 18, 2017 Chart reviewed for concurrent status and case management needs. Will continue to follow patient progress. Discharge Planning: following for needs Expected discharge date: [id number] Velva Harman, BSN, [place], [place] Expected Discharge Date:   (unknown)               Expected Discharge Plan:  [place]  In-House Referral:     Discharge planning Services  CM Consult  Post Acute Care Choice:  Durable Medical Equipment (Home 02-[facility] has travel tank) Choice offered to:  Patient  DME Arranged:    DME Agency:     HH Arranged:  PT [place]:  [place]  Status of Service:  In process, will continue to follow  If discussed at Long Length of Stay Meetings, dates discussed:    Additional Comments:  [name], RN 04/18/2017, 8:39 AM

## 2017-04-18 NOTE — Consult Note (Signed)
Consultation Note Date: 04/18/2017   Patient Name: Cindy Bradshaw. Cindy Bradshaw  DOB: 20-Mar-1971  MRN: [id number]  Age / Sex: 46 y.o., female  PCP: Cindy Cha, MD Referring Physician: Elmarie Shiley, MD  Reason for Consultation: Establishing goals of care, Non pain symptom management and Pain control  HPI/Patient Profile: 46 y.o. female  with past medical history of metastatic breast cancer with extensive pulmonary mets, h/o PE on lovenox, NICM with EF 25%, a-fib, asthma admitted on 04/11/2017 with acute hypoxic respiratory failure.  Cardiology has evaluated and currently on amio and levophed for a-fib and pressure support while diuresing.   She has widespread metastasis and with poor prognosis per oncology.  Plan is for trial of compassionate use of [place].  Pain remains poorly controlled and palliative consulted for pain management and goals of care.      Clinical Assessment and Goals of Care: I met today with Cindy Bradshaw.  She reports that her children are the most important thing to her.  She has a son with whom she lives as well as a daughter who has 4 children of her own.  Her main concern continues to be pain.  She reports multiple areas of pain, but main concern at this time is pain in lower chest.  Reports worse with coughing and some improvement with pain medications.  At home she has been using 12.72mg/hr fentanyl patch and taking morphine 326mas needed for rescue medication.  Since she has been in the hospital, fentanyl patch has been increased, but she still reports poor pain control overall and that current dose of IV morphine "knocks it down by a point for a little while."  She understands that her cancer is not curable, but she remains invested in follow-up with Dr. GuLindi Bradshaw trial of Keytruda.  Attempted to discuss continued decline with incurable cancer further complicated by HF exacerbation, but  she would not really engage in this conversation.  She is not interested in discussing goals further with me today. Will plan to readdress goals as she is willing to engage (hopefully more willing to discuss once pain is better controlled).  SUMMARY OF RECOMMENDATIONS   - Opioid use for last 24 hours reviewed.    - Fentanyl 5075mhr (roughly 100m63mal morphine equivalent)  - MSIR 30mg68mx 2 (60mg 45m morphine equivalent)  - Morphine 2mg IV77m4 (24mg or11morphine equivalent) - With poor pain control, plan for initiation of PCA for 24 hours to better determine her overall needs.  Will plan for fentanyl, which will hopefully cause less effect on her BP (causes less histamine release than currently ordered morphine) and will also be easily convertible to higher fentanyl patch dose once dose needs are determined.   - She is able to verbalize that she has incurable illness but remains invested in continuation of any offered interventions.  Remains a full code with continued aggressive care.    Code Status/Advance Care Planning:  Full code   Symptom Management:   As above  Palliative Prophylaxis:   Frequent Pain Assessment  Additional Recommendations (Limitations, Scope, Preferences):  Full Scope Treatment  Psycho-social/Spiritual:   Desire for further Chaplaincy support: Did not address today  Additional Recommendations: Caregiving  Support/Resources  Prognosis:   Unable to determine  Discharge Planning: To Be Determined      Primary Diagnoses: Present on Admission: . Metastatic breast cancer ([place]) . Dyspnea . Acute pulmonary embolism ([place]) . DCM (dilated cardiomyopathy) ([place]) . A-fib ([place]) . Asthma, chronic, unspecified asthma severity, with acute exacerbation . Acute respiratory failure ([place])   I have reviewed the medical record, interviewed the patient and family, and examined the patient. The following aspects are pertinent.  Past Medical History:  Diagnosis  Date  . Arthritis   . Asthma   . Back disorder    degenerative disk disease  . Breast cancer ([place])   . Cancer ([place])   . Cardiomyopathy ([place])    a. Dx 02/2017 - EF 25-30%, felt possibly due to adriamycin +/- tachy mediated from atrial fib/flutter.  Marland Kitchen GERD (gastroesophageal reflux disease)   . Mammogram abnormal 08/24/15   first  . Migraine   . Normal coronary arteries    01/2016 cath  . PAF (paroxysmal atrial fibrillation) ([place])   . Pap smear for cervical cancer screening 08/12/15  . Paroxysmal atrial flutter [facility])    Social History   Social History  . Marital status: Legally Separated    Spouse name: Cindy Bradshaw  . Number of children: 2  . Years of education: [name]   Occupational History  . Web designer    Social History Main Topics  . Smoking status: Never Smoker  . Smokeless tobacco: Never Used  . Alcohol use 0.0 oz/week     Comment: occassional glass of wine  . Drug use: No  . Sexual activity: Yes   Other Topics Concern  . None   Social History Narrative   ** Merged History Encounter **       First MP age 20.  LMP 08/26/15.     2 children carried to term.  Age at first live birth 35   H/o oral contrasception use      Family History  Problem Relation Age of Onset  . Lung cancer Mother 75       2 different types of lung cancer; metastasis to brain; smoker  . Other Mother        hx of hysterectomy for unspecified reason  . Bone cancer Maternal Uncle        dx. early 74s  . Breast cancer Maternal Grandmother        dx. early 64s, s/p mastectomy  . Cancer Paternal Grandfather        unspecified type of cancer, dx. late 85s  . Congestive Heart Failure Father        smoker  . Stroke Father   . Eczema Father   . Cirrhosis Maternal Grandfather   . Heart Problems Maternal Grandfather   . Asthma Daughter   . Allergies Daughter        hives  . Lung cancer Other        (maternal great uncle; MGM's brother); had a coal stove  . Cancer Cousin         unspecified type; d. early age (paternal first cousin once-removed)  . Cancer Cousin        dx. as a kid; in remission today; (paternal 2nd cousin)  . Cancer Cousin  unspecified type; d. early 67s; (maternal 1st cousin)   Scheduled Meds: . cefUROXime  250 mg Oral BID WC  . Chlorhexidine Gluconate Cloth  6 each Topical Q0600  . enoxaparin  1 mg/kg Subcutaneous BID  . fentaNYL  50 mcg Transdermal Q72H  . fentaNYL   Intravenous Q4H  . ferrous sulfate  325 mg Oral TID WC  . furosemide  20 mg Intravenous Q6H  . levalbuterol  0.63 mg Nebulization TID  . metoprolol succinate  25 mg Oral Daily  . pantoprazole  40 mg Oral Q1200  . polyethylene glycol  17 g Oral Daily  . predniSONE  50 mg Oral Q breakfast  . senna  2 tablet Oral Daily  . sodium chloride flush  10-40 mL Intracatheter Q12H   Continuous Infusions: . amiodarone 30 mg/hr (04/18/17 0946)  . norepinephrine (LEVOPHED) Adult infusion Stopped (04/17/17 1345)   PRN Meds:.acetaminophen **OR** acetaminophen, albuterol, ALPRAZolam, alum & mag hydroxide-simeth, diphenhydrAMINE, guaiFENesin-dextromethorphan, methocarbamol, morphine, morphine injection, naloxone **AND** sodium chloride flush, ondansetron **OR** ondansetron (ZOFRAN) IV, sodium chloride flush Medications Prior to Admission:  Prior to Admission medications   Medication Sig Start Date End Date Taking? Authorizing Provider  albuterol (PROVENTIL HFA;VENTOLIN HFA) 108 (90 Base) MCG/ACT inhaler Inhale 1-2 puffs into the lungs every 6 (six) hours as needed for wheezing or shortness of breath. 07/24/16  Yes Waynetta Pean, PA-C  albuterol (PROVENTIL) (2.5 MG/3ML) 0.083% nebulizer solution Take 2.5 mg by nebulization every 6 (six) hours as needed for wheezing or shortness of breath.   Yes [provider]  amiodarone (PACERONE) 200 MG tablet Take 1 tablet (200 mg total) by mouth 2 (two) times daily. Take 400 mg by mouth twice daily for 7 days, then take 200 mg by mouth  twice daily for 14 days, then take 200 mg by mouth daily Patient taking differently: Take 200 mg by mouth 2 (two) times daily.  03/07/17  Yes Nicholas Lose, MD  cholecalciferol (VITAMIN D) 1000 units tablet Take 1,000 Units by mouth daily.   Yes [provider]  dexamethasone (DECADRON) 4 MG tablet Take 0.5 tablets (2 mg total) by mouth daily. 03/07/17  Yes Nicholas Lose, MD  enoxaparin (LOVENOX) 100 MG/ML injection Inject 1 mL (100 mg total) into the skin every 12 (twelve) hours. 02/26/17  Yes Hosie Poisson, MD  fentaNYL (DURAGESIC - DOSED MCG/HR) 12 MCG/HR Place 1 patch (12.5 mcg total) onto the skin every 3 (three) days. 03/07/17  Yes Nicholas Lose, MD  ferrous sulfate 325 (65 FE) MG EC tablet Take 325 mg by mouth 3 (three) times daily with meals.   Yes [provider]  furosemide (LASIX) 40 MG tablet Take 1 tablet (40 mg total) by mouth every other day. 03/05/17  Yes Arbutus Leas, NP  guaiFENesin-dextromethorphan (ROBITUSSIN DM) 100-10 MG/5ML syrup Take 5 mLs by mouth every 4 (four) hours as needed for cough. 02/26/17  Yes Hosie Poisson, MD  losartan (COZAAR) 50 MG tablet Take 1 tablet (50 mg total) by mouth daily. 03/05/17  Yes Arbutus Leas, NP  Melatonin 10 MG TABS Take 10 mg by mouth at bedtime.    Yes [provider]  metoprolol succinate (TOPROL-XL) 50 MG 24 hr tablet Take 1 tablet (50 mg total) by mouth daily. Take with or immediately following a meal. 03/25/17  Yes Bensimhon, Shaune Pascal, MD  morphine (MSIR) 30 MG tablet Take 1 tablet (30 mg total) by mouth every 4 (four) hours as needed for severe pain. 03/07/17  Yes  Nicholas Lose, MD  ondansetron (ZOFRAN) 8 MG tablet Take 1 tablet (8 mg total) by mouth 2 (two) times daily as needed (Nausea or vomiting). 03/07/17  Yes Nicholas Lose, MD  potassium chloride SA (K-DUR,KLOR-CON) 20 MEQ tablet Take 1 tablet (20 mEq total) by mouth daily. 03/07/17  Yes Nicholas Lose, MD  senna (SENOKOT) 8.6 MG TABS tablet Take 1 tablet by mouth daily as  needed for mild constipation.   Yes [provider]  tobramycin-dexamethasone (TOBRADEX) ophthalmic solution Place 2 drops into both eyes every 4 (four) hours while awake. Patient not taking: Reported on 04/11/2017 02/18/17   Gardenia Phlegm, NP   Allergies  Allergen Reactions  . Dilaudid [Hydromorphone Hcl] Itching  . Ivp Dye [Iodinated Diagnostic Agents] Itching  . Percocet [Oxycodone-Acetaminophen] Itching  . Tape Itching and Rash    Hypofix causes rash  . Toradol [Ketorolac Tromethamine] Itching   Review of Systems  Constitutional: Positive for activity change and fatigue.  Respiratory: Positive for cough and shortness of breath.   Cardiovascular: Positive for chest pain and palpitations.  Neurological: Positive for weakness.  Psychiatric/Behavioral: Positive for sleep disturbance.   Physical Exam General: Alert, awake but sleepy, in mild/moderate distress from pain.  Heart: Irregular Lungs: Fair air movement Abdomen: Soft, nontender, nondistended, positive bowel sounds.  Skin: Warm and dry Neuro: Grossly intact, nonfocal.   Vital Signs: BP 116/76   Pulse 62   Temp 98.6 F (37 C) (Oral)   Resp (!) 23   Ht 5' 7"  (1.702 m)   Wt 96.3 kg (212 lb 4.9 oz)   SpO2 97%   BMI 33.25 kg/m  Pain Assessment: 0-10 POSS *See Group Information*: 1-Acceptable,Awake and alert Pain Score: 10-Worst pain ever   SpO2: SpO2: 97 % O2 Device:SpO2: 97 % O2 Flow Rate: .O2 Flow Rate (L/min): 3 L/min  IO: Intake/output summary:  Intake/Output Summary (Last 24 hours) at 04/18/17 1146 Last data filed at 04/18/17 0400  Gross per 24 hour  Intake           808.13 ml  Output             1775 ml  Net          -966.87 ml    LBM: Last BM Date: 04/16/17 Baseline Weight: Weight: 94.3 kg (207 lb 14.3 oz) Most recent weight: Weight: 96.3 kg (212 lb 4.9 oz)     Palliative Assessment/Data:     Time In: 1020 Time Out: 1120 Time Total: 60 Greater than 50%  of this time was  spent counseling and coordinating care related to the above assessment and plan.  Signed by: Micheline Rough, MD   Please contact Palliative Medicine Team phone at [phone] for questions and concerns.  For individual provider: See Shea Evans

## 2017-04-18 NOTE — Progress Notes (Signed)
PROGRESS NOTE    [name]  ZOX:[id number] DOB: [birth year] DOA: 04/11/2017 PCP: [name], MD  Brief Narrative: Cindy Bradshaw. Cindy Bradshaw is a 46 y.o. female with medical history significant of metastatic breast cancer with extensive pulmonary metastases, H/o PE on full dose lovenox, nonischemic cardiomyopathy ejection fraction of 25%, paroxysmal atrial fibrillation, asthma, presented to the ED with worsening shortness of breath, pt reports that this is gradually progressive over 1-2weeks, she was just started on O2 at 2L at her last visit with [name] and given reduced dose [place](chemo).She also reported low grade fevers with cough for last 1-2days, temperature upto 100.9 8/9 at home and some wheezing. CT chest with worsening pulm mets and small effusions  Assessment & Plan:   Acute hypoxic respiratory failure -Multifactorial, secondary to progression of her pulmonary metastatic disease, CHF also contributing -Prednisone taper.  -CTA chest notes progression of Pulm mets and small effusion.  -Appreciate [name]'s input, prognosis is poor, [name] plans to try Immunotherapy after discharge -appreciate cardiology assistance. Continue with IV lasix. Negative 2 L.   Acute on chronic systolic CHF/Nonischemic cardiomyopathy -EF 25% -Felt to be related to Adriamycin/chemotherapy, vs tachycardia induce.  -BP improved, continue with IV lasix.  -appreciate cardiology help.   Metastatic breast cancer with extensive pulmonary metastases -On palliative chemotherapy by [name] -Palliative care consult for goals of care and symptoms management.   H/o PE -on full dose lovenox -stable  P.Afib -with intermittent RVR, pulm disease from mets contributing -Already on full dose anticoagulation for history of PE -on IV amiodarone, BP improved.  -Plan for cardioversion on Friday.   Chronic pain -Related to metastatic cancer  -Appreciate [name] help, plan for fentanyl PCA.     DVT prophylaxis: on full dose Lovenox Code Status:  full code Family Communication: No family at bedside Disposition Plan: to be determine. Home when stable from Heart issues.   Consultants:   Onc [name]   Antimicrobials:   Rocephin/Zithromax 8/9-8/11  Cefdinir 8/11   Subjective: Patient still complaining of chest pain, worse when she take deep breath and cough.  Cough syrups is helping. Notice some improvement of dyspnea.   Objective: Vitals:   04/18/17 0504 04/18/17 0530 04/18/17 0556 04/18/17 0623  BP:  (!) 110/40    Pulse:   88 68  Resp:   (!) 25 (!) 27  Temp:      TempSrc:      SpO2:   100% 98%  Weight: 96.3 kg (212 lb 4.9 oz)     Height:        Intake/Output Summary (Last 24 hours) at 04/18/17 0818 Last data filed at 04/18/17 0400  Gross per 24 hour  Intake           808.13 ml  Output             1775 ml  Net          -966.87 ml   Filed Weights   04/12/17 0700 04/16/17 0501 04/18/17 0504  Weight: 94.3 kg (207 lb 14.3 oz) 99.7 kg (219 lb 12.8 oz) 96.3 kg (212 lb 4.9 oz)    Examination:  Gen: Obese, chronically ill appearing.  Lungs: normal respiratory effort, no wheezing.  CVS: IRR, S 1, S 2  Abd; Soft, nt, nd Extremities:  No cyanosis,  Skin no rashes noted   Data Reviewed:   CBC:  Recent Labs Lab 04/11/17 1421  04/13/17 0513 04/14/17 0453 04/15/17 0447 04/17/17 0436 04/18/17 0640  WBC 7.5  < >  17.4* 17.0* 13.2* 10.0 9.9  NEUTROABS 4.9  --   --   --   --   --   --   HGB 10.7*  < > 9.9* 9.2* 10.1* 10.6* 10.1*  HCT 33.8*  < > 31.5* 29.7* 32.1* 33.3* 32.0*  MCV 88.0  < > 87.5 89.5 88.2 88.6 87.2  PLT 557*  < > 616* 560* 587* 561* 514*  < > = values in this interval not displayed. Basic Metabolic Panel:  Recent Labs Lab 04/13/17 0513 04/14/17 0453 04/15/17 0447 04/17/17 0436 04/18/17 0640  NA 137 137 136 133* 132*  K 4.8 4.7 4.8 4.2 4.1  CL 100* 100* 102 95* 92*  CO2 28 30 28 30  33*  GLUCOSE 167* 126* 140* 95 99  BUN  22* 21* 21* 18 20  CREATININE 0.78 0.71 0.71 0.63 0.71  CALCIUM 10.2 10.1 10.0 9.8 9.9   GFR: Estimated Creatinine Clearance: 103.6 mL/min (by C-G formula based on SCr of 0.71 mg/dL). Liver Function Tests:  Recent Labs Lab 04/11/17 1421 04/12/17 0330  AST 48* 50*  ALT 56* 64*  ALKPHOS 112 106  BILITOT 0.6 0.3  PROT 6.8 6.8  ALBUMIN 2.8* 2.6*   No results for input(s): LIPASE, AMYLASE in the last 168 hours. No results for input(s): AMMONIA in the last 168 hours. Coagulation Profile:  Recent Labs Lab 04/11/17 1421  INR 0.98   Cardiac Enzymes: No results for input(s): CKTOTAL, CKMB, CKMBINDEX, TROPONINI in the last 168 hours. BNP (last 3 results) No results for input(s): PROBNP in the last 8760 hours. HbA1C: No results for input(s): HGBA1C in the last 72 hours. CBG:  Recent Labs Lab 04/17/17 1219  GLUCAP 143*   Lipid Profile: No results for input(s): CHOL, HDL, LDLCALC, TRIG, CHOLHDL, LDLDIRECT in the last 72 hours. Thyroid Function Tests: No results for input(s): TSH, T4TOTAL, FREET4, T3FREE, THYROIDAB in the last 72 hours. Anemia Panel: No results for input(s): VITAMINB12, FOLATE, FERRITIN, TIBC, IRON, RETICCTPCT in the last 72 hours. Urine analysis:    Component Value Date/Time   COLORURINE YELLOW 04/11/2017 [place] 04/11/2017 1716   LABSPEC 1.006 04/11/2017 1716   LABSPEC 1.010 04/10/2016 1224   PHURINE 5.0 04/11/2017 [place] 04/11/2017 1716   GLUCOSEU Negative 04/10/2016 1224   HGBUR SMALL (A) 04/11/2017 1716   BILIRUBINUR NEGATIVE 04/11/2017 1716   BILIRUBINUR Negative 04/10/2016 1224   KETONESUR NEGATIVE 04/11/2017 1716   PROTEINUR NEGATIVE 04/11/2017 1716   UROBILINOGEN 0.2 04/10/2016 1224   NITRITE NEGATIVE 04/11/2017 1716   LEUKOCYTESUR NEGATIVE 04/11/2017 1716   LEUKOCYTESUR Negative 04/10/2016 1224   Sepsis Labs: @LABRCNTIP (procalcitonin:4,lacticidven:4)  ) Recent Results (from the past 240 hour(s))   Culture, blood (Routine x 2)     Status: None   Collection Time: 04/11/17  2:28 PM  Result Value Ref Range Status   Specimen Description BLOOD RIGHT ANTECUBITAL  Final   Special Requests   Final    BOTTLES DRAWN AEROBIC AND ANAEROBIC Blood Culture adequate volume   Culture   Final    NO GROWTH 5 DAYS Performed at [facility], [place] [address] [place], [place] [address]    Report Status 04/16/2017 FINAL  Final  Culture, blood (Routine x 2)     Status: None   Collection Time: 04/11/17  2:56 PM  Result Value Ref Range Status   Specimen Description BLOOD PORT  Final   Special Requests   Final    BOTTLES DRAWN  AEROBIC AND ANAEROBIC Blood Culture adequate volume   Culture   Final    NO GROWTH 5 DAYS Performed at [facility], [place] [address], [place], [place] [address]    Report Status 04/16/2017 FINAL  Final  MRSA PCR Screening     Status: None   Collection Time: 04/11/17  7:17 PM  Result Value Ref Range Status   MRSA by PCR NEGATIVE NEGATIVE Final    Comment:        The GeneXpert MRSA Assay (FDA approved for NASAL specimens only), is one component of a comprehensive MRSA colonization surveillance program. It is not intended to diagnose MRSA infection nor to guide or monitor treatment for MRSA infections.          Radiology Studies: Dg Chest 2 View  Result Date: 04/17/2017 CLINICAL DATA:  History of metastatic breast cancer. Shortness of breath. EXAM: CHEST  2 VIEW COMPARISON:  04/14/2017, CT 04/12/2017 FINDINGS: Cardiomegaly with vascular congestion. Suspect small layering effusions. Bibasilar atelectasis or infiltrates. Nodular airspace opacities compatible with known metastatic disease. No real change since prior study. IMPRESSION: No significant change since prior study. Electronically Signed   By: Rolm Baptise M.D.   On: 04/17/2017 10:30        Scheduled Meds: . cefUROXime  250 mg Oral BID WC  . Chlorhexidine Gluconate Cloth  6 each Topical Q0600   . enoxaparin  1 mg/kg Subcutaneous BID  . fentaNYL  50 mcg Transdermal Q72H  . ferrous sulfate  325 mg Oral TID WC  . furosemide  20 mg Intravenous Q6H  . levalbuterol  0.63 mg Nebulization TID  . metoprolol succinate  25 mg Oral Daily  . pantoprazole  40 mg Oral Q1200  . polyethylene glycol  17 g Oral Daily  . polyethylene glycol  17 g Oral Daily  . predniSONE  50 mg Oral Q breakfast  . senna  2 tablet Oral Daily  . sodium chloride flush  10-40 mL Intracatheter Q12H   Continuous Infusions: . amiodarone 30 mg/hr (04/18/17 0400)  . norepinephrine (LEVOPHED) Adult infusion Stopped (04/17/17 1345)     LOS: 7 days    Time spent: 83min    Josephus Harriger, Md [facility] Pager [phone]  If 7PM-7AM, please contact night-coverage [email or web address] Password [facility] 04/18/2017, 8:18 AM

## 2017-04-18 NOTE — Progress Notes (Signed)
Progress Note  Patient Name: Cindy Bradshaw Date of Encounter: 04/18/2017  Primary Cardiologist: Dr. Radford Pax and Dr. Haroldine Laws for heart failure  Subjective   Pt is very uncomfortable. She has pain around the lower sternum area from coughing. Very tender to touch and pain worse with coughing. She says that her breathing was slightly better than yesterday until she started eating breakfast and coughing.   Inpatient Medications    Scheduled Meds: . cefUROXime  250 mg Oral BID WC  . Chlorhexidine Gluconate Cloth  6 each Topical Q0600  . enoxaparin  1 mg/kg Subcutaneous BID  . fentaNYL  50 mcg Transdermal Q72H  . ferrous sulfate  325 mg Oral TID WC  . furosemide  20 mg Intravenous Q6H  . levalbuterol  0.63 mg Nebulization TID  . metoprolol succinate  25 mg Oral Daily  . pantoprazole  40 mg Oral Q1200  . polyethylene glycol  17 g Oral Daily  . predniSONE  50 mg Oral Q breakfast  . senna  2 tablet Oral Daily  . sodium chloride flush  10-40 mL Intracatheter Q12H   Continuous Infusions: . amiodarone 30 mg/hr (04/18/17 0400)  . norepinephrine (LEVOPHED) Adult infusion Stopped (04/17/17 1345)   PRN Meds: acetaminophen **OR** acetaminophen, albuterol, ALPRAZolam, alum & mag hydroxide-simeth, diphenhydrAMINE, guaiFENesin-dextromethorphan, methocarbamol, morphine, morphine injection, ondansetron **OR** ondansetron (ZOFRAN) IV, sodium chloride flush   Vital Signs    Vitals:   04/18/17 0504 04/18/17 0530 04/18/17 0556 04/18/17 0623  BP:  (!) 110/40    Pulse:   88 68  Resp:   (!) 25 (!) 27  Temp:      TempSrc:      SpO2:   100% 98%  Weight: 212 lb 4.9 oz (96.3 kg)     Height:        Intake/Output Summary (Last 24 hours) at 04/18/17 0919 Last data filed at 04/18/17 0400  Gross per 24 hour  Intake           808.13 ml  Output             1775 ml  Net          -966.87 ml   Filed Weights   04/12/17 0700 04/16/17 0501 04/18/17 0504  Weight: 207 lb 14.3 oz (94.3 kg) 219 lb 12.8  oz (99.7 kg) 212 lb 4.9 oz (96.3 kg)    Telemetry    atrilal fibrillation mostly 90-110, occ up to 120's - Personally Reviewed  ECG    No new tracings - Personally Reviewed  Physical Exam   GEN: Pt moaning due to frequent coughing with chest pain. Short of breath with speech. Neck: No JVD Cardiac: irregularly irregular, no murmurs, rubs, or gallops.  Respiratory: Scattered crackles, frequent cough GI: Soft, nontender, non-distended  MS: No edema; No deformity. Neuro:  Nonfocal  Psych: Normal affect   Labs    Chemistry Recent Labs Lab 04/11/17 1421 04/12/17 0330  04/15/17 0447 04/17/17 0436 04/18/17 0640  NA 138 137  < > 136 133* 132*  K 3.3* 3.7  < > 4.8 4.2 4.1  CL 104 100*  < > 102 95* 92*  CO2 25 25  < > 28 30 33*  GLUCOSE 107* 141*  < > 140* 95 99  BUN 12 16  < > 21* 18 20  CREATININE 0.82 0.82  < > 0.71 0.63 0.71  CALCIUM 9.3 9.6  < > 10.0 9.8 9.9  PROT 6.8 6.8  --   --   --   --  ALBUMIN 2.8* 2.6*  --   --   --   --   AST 48* 50*  --   --   --   --   ALT 56* 64*  --   --   --   --   ALKPHOS 112 106  --   --   --   --   BILITOT 0.6 0.3  --   --   --   --   GFRNONAA >60 >60  < > >60 >60 >60  GFRAA >60 >60  < > >60 >60 >60  ANIONGAP 9 12  < > 6 8 7   < > = values in this interval not displayed.   Hematology Recent Labs Lab 04/15/17 0447 04/17/17 0436 04/18/17 0640  WBC 13.2* 10.0 9.9  RBC 3.64* 3.76* 3.67*  HGB 10.1* 10.6* 10.1*  HCT 32.1* 33.3* 32.0*  MCV 88.2 88.6 87.2  MCH 27.7 28.2 27.5  MCHC 31.5 31.8 31.6  RDW 18.9* 18.8* 18.7*  PLT 587* 561* 514*    Cardiac EnzymesNo results for input(s): TROPONINI in the last 168 hours.  Recent Labs Lab 04/11/17 1435  TROPIPOC 0.01     BNP Recent Labs Lab 04/11/17 1421  BNP 225.9*     DDimer No results for input(s): DDIMER in the last 168 hours.   Radiology    Dg Chest 2 View  Result Date: 04/17/2017 CLINICAL DATA:  History of metastatic breast cancer. Shortness of breath. EXAM: CHEST   2 VIEW COMPARISON:  04/14/2017, CT 04/12/2017 FINDINGS: Cardiomegaly with vascular congestion. Suspect small layering effusions. Bibasilar atelectasis or infiltrates. Nodular airspace opacities compatible with known metastatic disease. No real change since prior study. IMPRESSION: No significant change since prior study. Electronically Signed   By: Rolm Baptise M.D.   On: 04/17/2017 10:30    Cardiac Studies   Echocardiogram 02/12/17: Study Conclusions - Left ventricle: The cavity size was severely dilated. Wall thickness was normal. Systolic function was severely reduced. The estimated ejection fraction was in the range of 25% to 30%. Diffuse hypokinesis. The study is not technically sufficient to allow evaluation of LV diastolic function. - Mitral valve: There was mild regurgitation. - Left atrium: The atrium was moderately dilated. - Right atrium: The atrium was mildly dilated.  Echocardiogram 09/13/15: Study Conclusions - Left ventricle: The cavity size was normal. There was mild focal basal hypertrophy of the septum. Systolic function was normal. The estimated ejection fraction was in the range of 55% to 60%. Wall motion was normal; there were no regional wall motion abnormalities. Global longitudinal strain: -18.4% There was no evidence of elevated ventricular filling pressure by Doppler parameters.   Left heart cath 01/24/16: 1. Angiographically normal coronary arteries 2. The left ventricular systolic function is normal. Essentially normal coronary arteries with no significant coronary disease noted to explain abnormal stress test.  Suspect breast attenuation is the explanation for the false-positive stress test. I suspect noncardiac chest pain  Myoview 01/23/16: 1. Positive for moderate size region of reversible ischemia in the anteroseptal wall of the mid ventricle extending through the apex to the true apex. 2. Normal left ventricular wall  motion. 3. Left ventricular ejection fraction 54% 4. Non invasive risk stratification*: Intermediate   Patient Profile     46 y.o. female with a hx of Metastatic breast cancer with extensive pulmonary metastases, history of recent PE on full dose Lovenox, NICM with EF 25%, PAF, asthma who is being seen today for the evaluation of atrial fibrillation  at the request of Dr Tyrell Antonio.  Assessment & Plan    Acute on chronic Systolic heart failure -EF 25-30% (02/2017) down from 55-60% in 09/2015. Suspected to be tachycardia induced from atrial fibrillation or possible related to Adriamycin -Seen in heart failure clinic. -BNP 226.  -Pt was initially given IV lasix but BP dropped. She was transferred to ICU for Levophed for BP support so can diurese. She's getting Lasix 20 mg q 6h and her BP is stable so has not needed Levo.  -Wt is down 7 pounds from yesterday 219>>212. She's had 2L of urine output.  -Pt reports that her breathing was mildly improved this am, but got worse with coughing during eating breakfast.  -Scr is stable at 0.71 -Continue current diuresis at current dose.  Atrial fibrillation -Atrial fib noted in 02/2017, treated with amiodarone and was maintaining SR. However, developed Afib with RVR with this illness. -This patients CHA2DS2-VASc Score and unadjusted Ischemic Stroke Rate (% per year) is equal to 3.2 % stroke rate/year from a score of 3 [CHF, HTN, Female]. Was on Xarelto until developed PE. Now on full dose Lovenox. -Amiodarone switched to IV yesterday. Pt had about 30 minutes of NSR last night, but went back into afib with rates 90's-110, occ 120's with activity.  -Metoprolol decreased yesterday to allow room in BP for diuresis -Cardioversion is planned for Friday (earliest available). Pt has been on amiodarone since 02/2010. Has been on Lovenox since June.   Acute hypoxic respiratory failure -Per IM, multifactorial, primarily secondary to progression of her pulmonary  metastatic disease. CHF and afib with RVR likely contributing.  -CT of the chest showed progression of pulmonary mets and small effusion. Was being diuresed with lasix but this had to be scaled back due to low BP.   Metastatic breast cancer with extensive pulmonary metastasis -On palliative chemotherapy -Per Dr. Lindi Adie, the pt prognosis is poor. Palliative care has been consulted per note, although has not been seen yet.  Signed, Daune Perch, NP  04/18/2017, 9:19 AM    The patient was seen, examined and discussed with Daune Perch, NP-C and I agree with the above.    46 y.o. unfortunate female with a hx of Metastatic breast cancer with extensive pulmonary metastases, history of recent PE on 6/23, now on full dose Lovenox, NICM with EF 25%, PAF, asthma who is being seen today for the evaluation of atrial fibrillation at the request of Dr Tyrell Antonio. She was admitted with atrial fibrillation with RVR and acute on chronic combined systolic and diastolic CHF, LVEF 38-18% in 4/18 from 65-70% in 09/2015, etiology adriamycin vs tachy induced, possibly combination of both.  Today she is in severe acute on chronic combined CHF, hypotensive, tachycardic, severely fluid overloaded, diaphoretic, I will start levophed drip in order to be able to diurese, minimal diuresis overnight with worsening SOB. Give lasix 80 mg iv x 1, followed by lasix 40 mg iv Q6H, continue amiodarone drip, give Zofran for nausea/vomiting.  She is on palliative chemo, long tern plan yet to be establish, palliative care consult ordered per primary team. The decision should be done rather sooner than later, because this young female's prognosis is poor and she is very symptomatic and uncomfortable. I would pursue aggressive management with pressors and diuresis unless she is turned to comfort care only.    Ena Dawley, MD 04/18/2017

## 2017-04-18 NOTE — Progress Notes (Signed)
Initial Nutrition Assessment  DOCUMENTATION CODES:   Obesity unspecified  INTERVENTION:   Premier Protein BID, each supplement provides 160kcal and 30g protein.   NUTRITION DIAGNOSIS:   Increased nutrient needs related to cancer and cancer related treatments (CHF) as evidenced by increased estimated needs from protein.  GOAL:   Patient will meet greater than or equal to 90% of their needs  MONITOR:   PO intake, Supplement acceptance, Labs, Weight trends  REASON FOR ASSESSMENT:   Consult Assessment of nutrition requirement/status  ASSESSMENT:   46 y.o. female  with past medical history of metastatic breast cancer with extensive pulmonary mets, h/o PE on lovenox, NICM with EF 25%, a-fib, asthma admitted on 04/11/2017 with acute hypoxic respiratory failure.     Met with pt in room today. Pt reports that she is painful today and she was really not interested in engaging in conversation. Pt reports intermittent appetite and oral intake pta but has been worse over the past 3 days. Pt is currently eating  25-100% of meals. Pt does drink Premier Protein at home. Per chart, pt is weight stable. Pt receiving palliative chemo. Palliative care met with pt today; full scope of treatment for now.   Medications reviewed and include: lovenox, fentanyl, ferrous sulfate, lasix, protonix, miralax, prednisone, senokot, zofran, morphine   Labs reviewed: Na 132(L), Cl 92(L)  Nutrition-Focused physical exam completed. Findings are no fat depletion, no muscle depletion, and no edema.   Diet Order:  Diet Heart Room service appropriate? Yes; Fluid consistency: Thin; Fluid restriction: 1500 mL Fluid  Skin:  Reviewed, no issues  Last BM:  8/14  Height:   Ht Readings from Last 1 Encounters:  04/12/17 5' 7"  (1.702 m)    Weight:   Wt Readings from Last 1 Encounters:  04/18/17 212 lb 4.9 oz (96.3 kg)    Ideal Body Weight:  61.4 kg  BMI:  Body mass index is 33.25 kg/m.  Estimated  Nutritional Needs:   Kcal:  1900-2100kcal/day   Protein:  96-116g/day   Fluid:  >1.9L/day   EDUCATION NEEDS:   Education needs addressed  Koleen Distance MS, RD, LDN Pager #[phone] After Hours Pager: [phone]

## 2017-04-19 ENCOUNTER — Encounter (HOSPITAL_COMMUNITY): Payer: Self-pay | Admitting: Certified Registered Nurse Anesthetist

## 2017-04-19 DIAGNOSIS — I480 Paroxysmal atrial fibrillation: Secondary | ICD-10-CM

## 2017-04-19 LAB — CBC
HEMATOCRIT: 33.4 % — AB (ref 36.0–46.0)
HEMOGLOBIN: 10.8 g/dL — AB (ref 12.0–15.0)
MCH: 27.9 pg (ref 26.0–34.0)
MCHC: 32.3 g/dL (ref 30.0–36.0)
MCV: 86.3 fL (ref 78.0–100.0)
PLATELETS: 550 10*3/uL — AB (ref 150–400)
RBC: 3.87 MIL/uL (ref 3.87–5.11)
RDW: 18.6 % — ABNORMAL HIGH (ref 11.5–15.5)
WBC: 12.6 10*3/uL — AB (ref 4.0–10.5)

## 2017-04-19 LAB — BASIC METABOLIC PANEL
ANION GAP: 12 (ref 5–15)
BUN: 22 mg/dL — AB (ref 6–20)
CALCIUM: 9.8 mg/dL (ref 8.9–10.3)
CHLORIDE: 88 mmol/L — AB (ref 101–111)
CO2: 32 mmol/L (ref 22–32)
Creatinine, Ser: 0.86 mg/dL (ref 0.44–1.00)
GFR calc Af Amer: >60 mL/min (ref >60)
GLUCOSE: 110 mg/dL — AB (ref 65–99)
POTASSIUM: 3.9 mmol/L (ref 3.5–5.1)
Sodium: 132 mmol/L — ABNORMAL LOW (ref 135–145)

## 2017-04-19 MED ORDER — PROMETHAZINE HCL 25 MG/ML IJ SOLN
12.5000 mg | Freq: Four times a day (QID) | INTRAMUSCULAR | Status: DC | PRN
  Administered 2017-04-19 – 2017-04-27 (×6): 12.5 mg via INTRAVENOUS
  Filled 2017-04-19 (×6): qty 1

## 2017-04-19 MED ORDER — POTASSIUM CHLORIDE CRYS ER 10 MEQ PO TBCR
20.0000 meq | EXTENDED_RELEASE_TABLET | Freq: Two times a day (BID) | ORAL | Status: DC
  Administered 2017-04-19 – 2017-04-20 (×4): 20 meq via ORAL
  Filled 2017-04-19 (×4): qty 2

## 2017-04-19 MED ORDER — FENTANYL 40 MCG/ML IV SOLN
INTRAVENOUS | Status: DC
  Administered 2017-04-19: 40 ug via INTRAVENOUS
  Administered 2017-04-20: 75 ug via INTRAVENOUS
  Administered 2017-04-20 (×5): 0 ug via INTRAVENOUS

## 2017-04-19 MED ORDER — FUROSEMIDE 10 MG/ML IJ SOLN
80.0000 mg | Freq: Four times a day (QID) | INTRAMUSCULAR | Status: DC
  Administered 2017-04-19 – 2017-04-22 (×12): 80 mg via INTRAVENOUS
  Filled 2017-04-19 (×13): qty 8

## 2017-04-19 MED ORDER — FENTANYL 40 MCG/ML IV SOLN: INTRAVENOUS | Status: DC

## 2017-04-19 MED ORDER — BENZONATATE 100 MG PO CAPS
100.0000 mg | ORAL_CAPSULE | Freq: Three times a day (TID) | ORAL | Status: DC
  Administered 2017-04-19 – 2017-04-27 (×25): 100 mg via ORAL
  Filled 2017-04-19 (×25): qty 1

## 2017-04-19 NOTE — Progress Notes (Signed)
Progress Note  Patient Name: Cindy Bradshaw Date of Encounter: 04/19/2017  Primary Cardiologist: Dr. Radford Pax and Dr. Haroldine Laws for heart failure  Subjective   Pt is not feeling well today, nausea/vomiting, DCCV cancelled.  Inpatient Medications    Scheduled Meds: . benzonatate  100 mg Oral TID  . Chlorhexidine Gluconate Cloth  6 each Topical Q0600  . enoxaparin  1 mg/kg Subcutaneous BID  . fentaNYL  50 mcg Transdermal Q72H  . fentaNYL   Intravenous Q4H  . ferrous sulfate  325 mg Oral TID WC  . furosemide  40 mg Intravenous Q6H  . levalbuterol  0.63 mg Nebulization TID  . metoprolol succinate  25 mg Oral Daily  . pantoprazole  40 mg Oral Q1200  . polyethylene glycol  17 g Oral Daily  . predniSONE  50 mg Oral Q breakfast  . protein supplement shake  11 oz Oral BID BM  . senna  2 tablet Oral Daily  . sodium chloride flush  10-40 mL Intracatheter Q12H   Continuous Infusions: . amiodarone 30 mg/hr (04/19/17 0957)  . norepinephrine (LEVOPHED) Adult infusion    . ondansetron [facility]) IV 8 mg (04/19/17 0555)   PRN Meds: acetaminophen **OR** acetaminophen, albuterol, ALPRAZolam, alum & mag hydroxide-simeth, diphenhydrAMINE, guaiFENesin-dextromethorphan, methocarbamol, morphine, morphine injection, naloxone **AND** sodium chloride flush, promethazine, sodium chloride flush   Vital Signs    Vitals:   04/19/17 0900 04/19/17 0945 04/19/17 1000 04/19/17 1008  BP: (!) 100/51 (!) 101/55 113/73   Pulse: (!) 113 (!) 125 63   Resp: 18  (!) 24   Temp:      TempSrc:      SpO2: 95%  94% 96%  Weight:      Height:        Intake/Output Summary (Last 24 hours) at 04/19/17 1107 Last data filed at 04/19/17 0957  Gross per 24 hour  Intake           1202.7 ml  Output              850 ml  Net            352.7 ml   Filed Weights   04/12/17 0700 04/16/17 0501 04/18/17 0504  Weight: 207 lb 14.3 oz (94.3 kg) 219 lb 12.8 oz (99.7 kg) 212 lb 4.9 oz (96.3 kg)    Telemetry    atrilal  fibrillation mostly 90-110, occ up to 120's - Personally Reviewed  ECG    No new tracings - Personally Reviewed  Physical Exam   GEN: Sleeping, labored breathing.  Neck: No JVD Cardiac: irregularly irregular, no murmurs, rubs, or gallops.  Respiratory: Scattered crackles, frequent cough GI: Soft, nontender, non-distended  MS: No edema; No deformity. Neuro:  Nonfocal  Psych: Normal affect   Labs    Chemistry  Recent Labs Lab 04/17/17 0436 04/18/17 0640 04/19/17 0250  NA 133* 132* 132*  K 4.2 4.1 3.9  CL 95* 92* 88*  CO2 30 33* 32  GLUCOSE 95 99 110*  BUN 18 20 22*  CREATININE 0.63 0.71 0.86  CALCIUM 9.8 9.9 9.8  GFRNONAA >60 >60 >60  GFRAA >60 >60 >60  ANIONGAP 8 7 12      Hematology  Recent Labs Lab 04/17/17 0436 04/18/17 0640 04/19/17 0250  WBC 10.0 9.9 12.6*  RBC 3.76* 3.67* 3.87  HGB 10.6* 10.1* 10.8*  HCT 33.3* 32.0* 33.4*  MCV 88.6 87.2 86.3  MCH 28.2 27.5 27.9  MCHC 31.8 31.6 32.3  RDW 18.8*  18.7* 18.6*  PLT 561* 514* 550*    Cardiac EnzymesNo results for input(s): TROPONINI in the last 168 hours. No results for input(s): TROPIPOC in the last 168 hours.   BNPNo results for input(s): BNP, PROBNP in the last 168 hours.   DDimer No results for input(s): DDIMER in the last 168 hours.   Radiology    No results found.  Cardiac Studies   Echocardiogram 02/12/17: Study Conclusions - Left ventricle: The cavity size was severely dilated. Wall thickness was normal. Systolic function was severely reduced. The estimated ejection fraction was in the range of 25% to 30%. Diffuse hypokinesis. The study is not technically sufficient to allow evaluation of LV diastolic function. - Mitral valve: There was mild regurgitation. - Left atrium: The atrium was moderately dilated. - Right atrium: The atrium was mildly dilated.  Echocardiogram 09/13/15: Study Conclusions - Left ventricle: The cavity size was normal. There was mild focal basal  hypertrophy of the septum. Systolic function was normal. The estimated ejection fraction was in the range of 55% to 60%. Wall motion was normal; there were no regional wall motion abnormalities. Global longitudinal strain: -18.4% There was no evidence of elevated ventricular filling pressure by Doppler parameters.   Left heart cath 01/24/16: 1. Angiographically normal coronary arteries 2. The left ventricular systolic function is normal. Essentially normal coronary arteries with no significant coronary disease noted to explain abnormal stress test.  Suspect breast attenuation is the explanation for the false-positive stress test. I suspect noncardiac chest pain  Myoview 01/23/16: 1. Positive for moderate size region of reversible ischemia in the anteroseptal wall of the mid ventricle extending through the apex to the true apex. 2. Normal left ventricular wall motion. 3. Left ventricular ejection fraction 54% 4. Non invasive risk stratification*: Intermediate   Patient Profile     46 y.o. female with a hx of Metastatic breast cancer with extensive pulmonary metastases, history of recent PE on full dose Lovenox, NICM with EF 25%, PAF, asthma who is being seen today for the evaluation of atrial fibrillation at the request of Dr Tyrell Antonio.  Assessment & Plan    Acute on chronic Systolic heart failure Atrial fibrillation Acute hypoxic respiratory failure Metastatic breast cancer with extensive pulmonary metastasis    46 y.o. unfortunate female with a hx of Metastatic breast cancer with extensive pulmonary metastases, history of recent PE on 6/23, now on full dose Lovenox, NICM with EF 25%, PAF, asthma who is being seen today for the evaluation of atrial fibrillation at the request of Dr Tyrell Antonio. She was admitted with atrial fibrillation with RVR and acute on chronic combined systolic and diastolic CHF, LVEF 60-45% in 4/18 from 65-70% in 09/2015, etiology adriamycin vs tachy  induced, possibly combination of both.  Today she is in severe acute on chronic combined CHF, hypotensive, tachycardic, severely fluid overloaded, diaphoretic, I will start levophed drip in order to be able to diurese, this was held yesterday, I will increase lasix to 80 mg iv Q6H, diuresis not recorded but weight down 7 lbs.  Please start Levophed if needed for hypotension, don't hold lasix because of that.  Plan: continue amiodarone drip and DCCV on Monday.  Give Zofran for nausea/vomiting.  She is on palliative chemo, long tern plan yet to be establish, palliative care consult ordered, the patient doesn't want to go that route yet. The decision should be done rather sooner than later, because this young female's prognosis is poor and she is very symptomatic and uncomfortable. I  would pursue aggressive management with pressors and diuresis unless she is turned to comfort care only.   Ena Dawley, MD 04/19/2017

## 2017-04-19 NOTE — Progress Notes (Signed)
Endo nurse called to ask about transportation to [facility] for Cardioversion, patient was unaware of scheduled cardioversion, hospitalist also unaware, no consent or NPO orders. Patient also nauseated and has had one epidoe of vomiting, endo team at [facility] made aware. Cardiology MD paged for further instructions.

## 2017-04-19 NOTE — Progress Notes (Signed)
PT Cancellation Note  Patient Details Name: Galileah K. Kahan MRN: [id number] DOB: 1971-08-27   Cancelled Treatment:     pt with active nausea and was given Zofran this morning.  Now sleeping.  Pt scheduled for DCCV Monday.    Rica Koyanagi  PTA [facility]  Acute  Rehab Pager      [phone]

## 2017-04-19 NOTE — Progress Notes (Signed)
PROGRESS NOTE    Cindy Bradshaw  DUK:[id number] DOB: [birth year] DOA: 04/11/2017 PCP: [name], MD  Brief Narrative: Cindy Bradshaw is a 46 y.o. female with medical history significant of metastatic breast cancer with extensive pulmonary metastases, H/o PE on full dose lovenox, nonischemic cardiomyopathy ejection fraction of 25%, paroxysmal atrial fibrillation, asthma, presented to the ED with worsening shortness of breath, pt reports that this is gradually progressive over 1-2weeks, she was just started on O2 at 2L at her last visit with [name] and given reduced dose [place](chemo).She also reported low grade fevers with cough for last 1-2days, temperature upto 100.9 8/9 at home and some wheezing. CT chest with worsening pulm mets and small effusions  Assessment & Plan:   Acute hypoxic respiratory failure -Multifactorial, secondary to progression of her pulmonary metastatic disease, CHF also contributing -Prednisone taper.  -CTA chest notes progression of Pulm mets and small effusion.  -Appreciate [name]'s input, prognosis is poor, [name] plans to try Immunotherapy after discharge -Appreciate cardiology assistance. Continue with IV lasix. Negative 2 L.  Report some improvement of dyspnea.  -will add tessalon pear. Continue with guaifenesin.   Acute on chronic systolic CHF/Nonischemic cardiomyopathy -EF 25% -Felt to be related to Adriamycin/chemotherapy, vs tachycardia induce.  -BP improved, continue with IV lasix.  -appreciate cardiology help.  -negative 2 l.   Metastatic breast cancer with extensive pulmonary metastases -On palliative chemotherapy by [name] -Palliative care consult for goals of care and symptoms management.  -patient wants to be full code and continue with aggressive care.    H/o PE -on full dose lovenox -stable  P.Afib -with intermittent RVR, pulm disease from mets contributing -Already on full dose anticoagulation for history of  PE -on IV amiodarone, BP improved.  -Plan for cardioversion today. ???  Chronic pain -Related to metastatic cancer  -Appreciate [name] help, plan for fentanyl PCA.  -report pain is not controlled with PCA. Morphine helps more.   Nausea, vomiting;  She had BM yesterday. Abdomen mild tenderness. No rigidity.  IV Zofran, will add phenergan./    DVT prophylaxis: on full dose Lovenox Code Status:  full code Family Communication: No family at bedside Disposition Plan: possible cardioversion.   Consultants:   Onc [name]   Antimicrobials:   Rocephin/Zithromax 8/9-8/11  Cefdinir 8/11   Subjective: She is vomiting this morning. She was nauseous last night.  She had BM yesterday.  She relates some improvement of dyspnea.   Objective: Vitals:   04/19/17 0612 04/19/17 0700 04/19/17 0705 04/19/17 0741  BP:  (!) 114/49    Pulse: (!) 102 (!) 106 94   Resp: (!) 21 (!) 26 (!) 26 (!) 22  Temp:      TempSrc:      SpO2: 94% 96% 94% 95%  Weight:      Height:        Intake/Output Summary (Last 24 hours) at 04/19/17 0747 Last data filed at 04/19/17 0600  Gross per 24 hour  Intake            546.1 ml  Output              850 ml  Net           -303.9 ml   Filed Weights   04/12/17 0700 04/16/17 0501 04/18/17 0504  Weight: 94.3 kg (207 lb 14.3 oz) 99.7 kg (219 lb 12.8 oz) 96.3 kg (212 lb 4.9 oz)    Examination:  Gen: Obese, chronic ill. Vomiting.  Lungs:  Normal respiratory effort, bilateral crackles.  CVS: S 1, S 2 IRR Abd; soft, mild tenderness, no rigidity.  Extremities: No cyanosis.  Skin; No rash.    Data Reviewed:   CBC:  Recent Labs Lab 04/14/17 0453 04/15/17 0447 04/17/17 0436 04/18/17 0640 04/19/17 0250  WBC 17.0* 13.2* 10.0 9.9 12.6*  HGB 9.2* 10.1* 10.6* 10.1* 10.8*  HCT 29.7* 32.1* 33.3* 32.0* 33.4*  MCV 89.5 88.2 88.6 87.2 86.3  PLT 560* 587* 561* 514* 063*   Basic Metabolic Panel:  Recent Labs Lab 04/14/17 0453 04/15/17 0447  04/17/17 0436 04/18/17 0640 04/19/17 0250  NA 137 136 133* 132* 132*  K 4.7 4.8 4.2 4.1 3.9  CL 100* 102 95* 92* 88*  CO2 30 28 30  33* 32  GLUCOSE 126* 140* 95 99 110*  BUN 21* 21* 18 20 22*  CREATININE 0.71 0.71 0.63 0.71 0.86  CALCIUM 10.1 10.0 9.8 9.9 9.8   GFR: Estimated Creatinine Clearance: 96.4 mL/min (by C-G formula based on SCr of 0.86 mg/dL). Liver Function Tests: No results for input(s): AST, ALT, ALKPHOS, BILITOT, PROT, ALBUMIN in the last 168 hours. No results for input(s): LIPASE, AMYLASE in the last 168 hours. No results for input(s): AMMONIA in the last 168 hours. Coagulation Profile: No results for input(s): INR, PROTIME in the last 168 hours. Cardiac Enzymes: No results for input(s): CKTOTAL, CKMB, CKMBINDEX, TROPONINI in the last 168 hours. BNP (last 3 results) No results for input(s): PROBNP in the last 8760 hours. HbA1C: No results for input(s): HGBA1C in the last 72 hours. CBG:  Recent Labs Lab 04/17/17 1219  GLUCAP 143*   Lipid Profile: No results for input(s): CHOL, HDL, LDLCALC, TRIG, CHOLHDL, LDLDIRECT in the last 72 hours. Thyroid Function Tests: No results for input(s): TSH, T4TOTAL, FREET4, T3FREE, THYROIDAB in the last 72 hours. Anemia Panel: No results for input(s): VITAMINB12, FOLATE, FERRITIN, TIBC, IRON, RETICCTPCT in the last 72 hours. Urine analysis:    Component Value Date/Time   COLORURINE YELLOW 04/11/2017 [place] 04/11/2017 1716   LABSPEC 1.006 04/11/2017 1716   LABSPEC 1.010 04/10/2016 1224   PHURINE 5.0 04/11/2017 [place] 04/11/2017 1716   GLUCOSEU Negative 04/10/2016 1224   HGBUR SMALL (A) 04/11/2017 1716   BILIRUBINUR NEGATIVE 04/11/2017 1716   BILIRUBINUR Negative 04/10/2016 1224   KETONESUR NEGATIVE 04/11/2017 1716   PROTEINUR NEGATIVE 04/11/2017 1716   UROBILINOGEN 0.2 04/10/2016 1224   NITRITE NEGATIVE 04/11/2017 1716   LEUKOCYTESUR NEGATIVE 04/11/2017 1716   LEUKOCYTESUR  Negative 04/10/2016 1224   Sepsis Labs: @LABRCNTIP (procalcitonin:4,lacticidven:4)  ) Recent Results (from the past 240 hour(s))  Culture, blood (Routine x 2)     Status: None   Collection Time: 04/11/17  2:28 PM  Result Value Ref Range Status   Specimen Description BLOOD RIGHT ANTECUBITAL  Final   Special Requests   Final    BOTTLES DRAWN AEROBIC AND ANAEROBIC Blood Culture adequate volume   Culture   Final    NO GROWTH 5 DAYS Performed at [facility], [place] [address] [place], [place] [address]    Report Status 04/16/2017 FINAL  Final  Culture, blood (Routine x 2)     Status: None   Collection Time: 04/11/17  2:56 PM  Result Value Ref Range Status   Specimen Description BLOOD PORT  Final   Special Requests   Final    BOTTLES DRAWN AEROBIC AND ANAEROBIC Blood Culture adequate volume   Culture   Final  NO GROWTH 5 DAYS Performed at [facility], [place] [address], [place], [place] [address]    Report Status 04/16/2017 FINAL  Final  MRSA PCR Screening     Status: None   Collection Time: 04/11/17  7:17 PM  Result Value Ref Range Status   MRSA by PCR NEGATIVE NEGATIVE Final    Comment:        The GeneXpert MRSA Assay (FDA approved for NASAL specimens only), is one component of a comprehensive MRSA colonization surveillance program. It is not intended to diagnose MRSA infection nor to guide or monitor treatment for MRSA infections.          Radiology Studies: Dg Chest 2 View  Result Date: 04/17/2017 CLINICAL DATA:  History of metastatic breast cancer. Shortness of breath. EXAM: CHEST  2 VIEW COMPARISON:  04/14/2017, CT 04/12/2017 FINDINGS: Cardiomegaly with vascular congestion. Suspect small layering effusions. Bibasilar atelectasis or infiltrates. Nodular airspace opacities compatible with known metastatic disease. No real change since prior study. IMPRESSION: No significant change since prior study. Electronically Signed   By: Rolm Baptise M.D.   On:  04/17/2017 10:30        Scheduled Meds: . Chlorhexidine Gluconate Cloth  6 each Topical Q0600  . enoxaparin  1 mg/kg Subcutaneous BID  . fentaNYL  50 mcg Transdermal Q72H  . fentaNYL   Intravenous Q4H  . ferrous sulfate  325 mg Oral TID WC  . furosemide  40 mg Intravenous Q6H  . levalbuterol  0.63 mg Nebulization TID  . metoprolol succinate  25 mg Oral Daily  . pantoprazole  40 mg Oral Q1200  . polyethylene glycol  17 g Oral Daily  . predniSONE  50 mg Oral Q breakfast  . protein supplement shake  11 oz Oral BID BM  . senna  2 tablet Oral Daily  . sodium chloride flush  10-40 mL Intracatheter Q12H   Continuous Infusions: . amiodarone 30 mg/hr (04/19/17 0600)  . norepinephrine (LEVOPHED) Adult infusion    . ondansetron (ZOFRAN) IV 8 mg (04/19/17 0555)     LOS: 8 days    Time spent: 35 minutes.     Niel Hummer, Md [facility] Pager [phone]  If 7PM-7AM, please contact night-coverage [email or web address] Password [email or web address] 04/19/2017, 7:47 AM

## 2017-04-19 NOTE — Procedures (Signed)
DCCV postponed today per Dr Meda Coffee due to NV and inability to sedate. Kirk Ruths, MD

## 2017-04-19 NOTE — Progress Notes (Signed)
Daily Progress Note   Patient Name: Cindy Bradshaw       Date: 04/19/2017 DOB: 12-Jan-1971  Age: 46 y.o. MRN#: [id number] Attending Physician: Elmarie Shiley, MD Primary Care Physician: [name], MD Admit Date: 04/11/2017  Reason for Consultation/Follow-up: Establishing goals of care and Pain control  Subjective: Ms. Reicks reports that she is still having uncontrolled pain.  Current pain is 5-6/10.  She feels as though dose of morphine was more effective that fentanyl.    See below:  Length of Stay: 8  Current Medications: Scheduled Meds:  . benzonatate  100 mg Oral TID  . Chlorhexidine Gluconate Cloth  6 each Topical Q0600  . enoxaparin  1 mg/kg Subcutaneous BID  . fentaNYL  50 mcg Transdermal Q72H  . fentaNYL   Intravenous Q4H  . ferrous sulfate  325 mg Oral TID WC  . furosemide  80 mg Intravenous Q6H  . levalbuterol  0.63 mg Nebulization TID  . metoprolol succinate  25 mg Oral Daily  . pantoprazole  40 mg Oral Q1200  . polyethylene glycol  17 g Oral Daily  . potassium chloride  20 mEq Oral BID  . predniSONE  50 mg Oral Q breakfast  . protein supplement shake  11 oz Oral BID BM  . senna  2 tablet Oral Daily  . sodium chloride flush  10-40 mL Intracatheter Q12H    Continuous Infusions: . amiodarone 30 mg/hr (04/19/17 0957)  . norepinephrine (LEVOPHED) Adult infusion    . ondansetron [facility]) IV Stopped (04/19/17 1334)    PRN Meds: acetaminophen **OR** acetaminophen, albuterol, ALPRAZolam, alum & mag hydroxide-simeth, diphenhydrAMINE, guaiFENesin-dextromethorphan, methocarbamol, morphine injection, naloxone **AND** sodium chloride flush, promethazine, sodium chloride flush  Physical Exam    General: Alert, awake, in no acute distress.  HEENT: No bruits,  no goiter, no JVD Heart: Regular rate and rhythm. No murmur appreciated. Lungs: Good air movement, clear Abdomen: Soft, nontender, nondistended, positive bowel sounds.  Ext: No significant edema Skin: Warm and dry Neuro: Grossly intact, nonfocal.        Vital Signs: BP (!) 115/52   Pulse (!) 103   Temp 98.4 F (36.9 C) (Oral)   Resp 20   Ht 5\' 7"  (1.702 m)   Wt 96.3 kg (212 lb 4.9 oz)   SpO2 95%   BMI  33.25 kg/m  SpO2: SpO2: 95 % O2 Device: O2 Device: Not Delivered O2 Flow Rate: O2 Flow Rate (L/min): 2 L/min  Intake/output summary:  Intake/Output Summary (Last 24 hours) at 04/19/17 1438 Last data filed at 04/19/17 0957  Gross per 24 hour  Intake           1152.6 ml  Output              400 ml  Net            752.6 ml   LBM: Last BM Date: 04/16/17 Baseline Weight: Weight: 94.3 kg (207 lb 14.3 oz) Most recent weight: Weight: 96.3 kg (212 lb 4.9 oz)       Palliative Assessment/Data:      Patient Active Problem List   Diagnosis Date Noted  . Acute on chronic combined systolic and diastolic heart failure ([place])   . Asthma, chronic, unspecified asthma severity, with acute exacerbation 04/11/2017  . Acute respiratory failure ([place]) 04/11/2017  . Hypoxia 04/04/2017  . Dyspnea 02/23/2017  . Acute pulmonary embolism ([place]) 02/23/2017  . Cardiomyopathy ([place])   . A-fib ([place]) 02/13/2017  . Metastatic breast cancer ([place])   . DCM (dilated cardiomyopathy) ([place])   . Atrial fibrillation with RVR ([place]) 02/11/2017  . Chest pain   . Adnexal cyst: Right per CT 12/09/2015 12/12/2015  . Chemotherapy-induced peripheral neuropathy ([place]) 12/05/2015  . Normocytic normochromic anemia 09/19/2015  . Breast cancer of upper-outer quadrant of left female breast ([place]) 09/07/2015    Palliative Care Assessment & Plan   Patient Profile: 46 y.o. female  with past medical history of metastatic breast cancer with extensive pulmonary mets, h/o PE on lovenox, NICM with EF 25%, a-fib, asthma  admitted on 04/11/2017 with acute hypoxic respiratory failure.  Cardiology has evaluated and currently on amio and levophed for a-fib and pressure support while diuresing.   She has widespread metastasis and with poor prognosis per oncology.  Plan is for trial of compassionate use of [place].  Pain remains poorly controlled and palliative consulted for pain management and goals of care.      Recommendations/Plan:  Pain:  Ms. Buckles reports that her pain remains poorly controlled.  I reviewed PCA dosing and she has received only 140 g of fentanyl via PCA in the last 24 hours. This is less than 1 usage per hour on average and is much less then what is actually available to her.  Her current settings would allow for her to receive up to 50 g per hour (10 g per dose with 12 minute lockout, which results in 24-hour availability of up to 1237mcg).  In talking with her, she reports that she will only use PCA when her pain "gets real bad. Like a 9 or 10" and she has limited herself to only hitting the button every hour or two.  She has had no medication in the last 4 hours.  We discussed using PCA at length and how to utilize it as she needs it to better control her pain. I also adjusted her PCA dosing to 25 g with a 20 minute lockout as I believe that higher dosing when she utilizes it will be more effective for her. If, however, she continues to have uncontrolled with PCA, I have also left morphine 2 mg every 2 hours as needed as a backup. I spoke with her bedside RN to continue to encourage her to use PCA as needed and not wait until her pain gets to 9-10  out of 10 in order to utilize. I discussed with her that the goal with PCA is to use lower doses of medication much more frequently in order to better titrate what her overall needs are to be comfortable. She expresses understanding of this. If she continues to have uncontrolled pain and is not utilizing PCA effectively, will plan to rotate off of it tomorrow.    Goals of Care and Additional Recommendations:  Limitations on Scope of Treatment: Full Scope Treatment  Code Status:    Code Status Orders        Start     Ordered   04/11/17 1831  Full code  Continuous     04/11/17 1831    Code Status History    Date Active Date Inactive Code Status Order ID Comments User Context   02/23/2017  8:44 AM 02/26/2017  6:00 PM Full Code [id number]  Radene Gunning, NP ED   02/11/2017  4:13 PM 02/14/2017  6:33 PM Full Code [id number]  Robbie Lis, MD Inpatient   02/21/2016  4:25 PM 02/22/2016 11:29 AM Full Code [id number]  Irene Limbo, MD Inpatient   01/21/2016  6:36 PM 01/25/2016  3:06 PM Full Code [id number]  Debbe Odea, MD ED   12/09/2015  1:58 AM 12/12/2015  4:32 PM Full Code [id number]  Ivor Costa, MD ED       Prognosis:  Guarded  Discharge Planning:  To Be Determined  Care plan was discussed with patient, RN, Dr. Tyrell Antonio  Thank you for allowing the Palliative Medicine Team to assist in the care of this patient.   Total Time 40 Prolonged Time Billed No      Greater than 50%  of this time was spent counseling and coordinating care related to the above assessment and plan.  Micheline Rough, MD  Please contact Palliative Medicine Team phone at [phone] for questions and concerns.

## 2017-04-19 NOTE — Progress Notes (Signed)
Spoke with Dr. Meda Coffee and she is ok with holding off on Cardioversion until Monday due to nausea and active vomiting. Patient is rescheduled for Monday @ noon for cardioversion. Bedside RN made aware.

## 2017-04-20 ENCOUNTER — Inpatient Hospital Stay (HOSPITAL_COMMUNITY): Payer: Federal, State, Local not specified - PPO

## 2017-04-20 DIAGNOSIS — I481 Persistent atrial fibrillation: Secondary | ICD-10-CM

## 2017-04-20 DIAGNOSIS — R0600 Dyspnea, unspecified: Secondary | ICD-10-CM

## 2017-04-20 LAB — BASIC METABOLIC PANEL
ANION GAP: 10 (ref 5–15)
BUN: 20 mg/dL (ref 6–20)
CO2: 35 mmol/L — ABNORMAL HIGH (ref 22–32)
Calcium: 10.3 mg/dL (ref 8.9–10.3)
Chloride: 85 mmol/L — ABNORMAL LOW (ref 101–111)
Creatinine, Ser: 1.09 mg/dL — ABNORMAL HIGH (ref 0.44–1.00)
GFR, EST NON AFRICAN AMERICAN: 59 mL/min — AB (ref >60)
Glucose, Bld: 116 mg/dL — ABNORMAL HIGH (ref 65–99)
POTASSIUM: 3.8 mmol/L (ref 3.5–5.1)
SODIUM: 130 mmol/L — AB (ref 135–145)

## 2017-04-20 LAB — CBC
HCT: 32.1 % — ABNORMAL LOW (ref 36.0–46.0)
HEMOGLOBIN: 10.2 g/dL — AB (ref 12.0–15.0)
MCH: 27.6 pg (ref 26.0–34.0)
MCHC: 31.8 g/dL (ref 30.0–36.0)
MCV: 86.8 fL (ref 78.0–100.0)
Platelets: 597 10*3/uL — ABNORMAL HIGH (ref 150–400)
RBC: 3.7 MIL/uL — AB (ref 3.87–5.11)
RDW: 18.5 % — ABNORMAL HIGH (ref 11.5–15.5)
WBC: 13.5 10*3/uL — AB (ref 4.0–10.5)

## 2017-04-20 MED ORDER — BISACODYL 10 MG RE SUPP
10.0000 mg | Freq: Once | RECTAL | Status: AC
  Administered 2017-04-20: 10 mg via RECTAL
  Filled 2017-04-20: qty 1

## 2017-04-20 MED ORDER — GUAIFENESIN-CODEINE 100-10 MG/5ML PO SOLN
5.0000 mL | ORAL | Status: DC | PRN
  Administered 2017-04-20 – 2017-04-27 (×21): 5 mL via ORAL
  Filled 2017-04-20 (×23): qty 5

## 2017-04-20 MED ORDER — BISACODYL 10 MG RE SUPP
10.0000 mg | Freq: Every day | RECTAL | Status: DC | PRN
  Administered 2017-04-21: 10 mg via RECTAL
  Filled 2017-04-20: qty 1

## 2017-04-20 MED ORDER — PREDNISONE 20 MG PO TABS
40.0000 mg | ORAL_TABLET | Freq: Every day | ORAL | Status: DC
  Administered 2017-04-21: 40 mg via ORAL
  Filled 2017-04-20: qty 2

## 2017-04-20 MED ORDER — POLYETHYLENE GLYCOL 3350 17 G PO PACK
17.0000 g | PACK | Freq: Two times a day (BID) | ORAL | Status: DC
  Administered 2017-04-23 – 2017-04-27 (×8): 17 g via ORAL
  Filled 2017-04-20 (×11): qty 1

## 2017-04-20 MED ORDER — MORPHINE SULFATE (PF) 2 MG/ML IV SOLN
1.0000 mg | INTRAVENOUS | Status: DC | PRN
  Administered 2017-04-20 – 2017-04-22 (×14): 2 mg via INTRAVENOUS
  Filled 2017-04-20 (×14): qty 1

## 2017-04-20 NOTE — Progress Notes (Signed)
Pt refuses to wear continuous ECO2 monitor as it makes her nauseated.  She said it is ok to spot  Check as needed.

## 2017-04-20 NOTE — Progress Notes (Signed)
Daily Progress Note   Patient Name: Cindy Bradshaw       Date: 04/20/2017 DOB: May 13, 1971  Age: 46 y.o. MRN#: [id number] Attending Physician: Elmarie Shiley, MD Primary Care Physician: [name], MD Admit Date: 04/11/2017  Reason for Consultation/Follow-up: Establishing goals of care and Pain control  Subjective: Cindy Bradshaw reports that she was still having uncontrolled pain despite change in PCA dosing.  Pain has been better since utilizing PRN morphine.  Current pain is 3-4/10.    Her son was present today and continued conversation about incurable nature of her illness. See below:  Length of Stay: 9  Current Medications: Scheduled Meds:  . benzonatate  100 mg Oral TID  . Chlorhexidine Gluconate Cloth  6 each Topical Q0600  . enoxaparin  1 mg/kg Subcutaneous BID  . fentaNYL  50 mcg Transdermal Q72H  . ferrous sulfate  325 mg Oral TID WC  . furosemide  80 mg Intravenous Q6H  . levalbuterol  0.63 mg Nebulization TID  . metoprolol succinate  25 mg Oral Daily  . pantoprazole  40 mg Oral Q1200  . polyethylene glycol  17 g Oral BID  . potassium chloride  20 mEq Oral BID  . [START ON 04/21/2017] predniSONE  40 mg Oral Q breakfast  . protein supplement shake  11 oz Oral BID BM  . senna  2 tablet Oral Daily  . sodium chloride flush  10-40 mL Intracatheter Q12H    Continuous Infusions: . amiodarone 30 mg/hr (04/20/17 1300)  . norepinephrine (LEVOPHED) Adult infusion    . ondansetron (ZOFRAN) IV 8 mg (04/20/17 1800)    PRN Meds: acetaminophen **OR** acetaminophen, albuterol, ALPRAZolam, alum & mag hydroxide-simeth, bisacodyl, diphenhydrAMINE, guaiFENesin-codeine, guaiFENesin-dextromethorphan, methocarbamol, morphine injection, promethazine, sodium chloride  flush  Physical Exam    General: Alert, awake, in no acute distress.  HEENT: No bruits, no goiter, no JVD Heart: Regular rate and rhythm. No murmur appreciated. Lungs: Good air movement, clear Abdomen: Soft, nontender, nondistended, positive bowel sounds.  Ext: No significant edema Skin: Warm and dry Neuro: Grossly intact, nonfocal.        Vital Signs: BP (!) 88/62   Pulse 88   Temp 98.3 F (36.8 C) (Oral)   Resp 20   Ht 5\' 7"  (1.702 m)   Wt 93.2 kg (205 lb 7.5  oz)   SpO2 97%   BMI 32.18 kg/m  SpO2: SpO2: 97 % O2 Device: O2 Device: Not Delivered O2 Flow Rate: O2 Flow Rate (L/min): 21 L/min  Intake/output summary:   Intake/Output Summary (Last 24 hours) at 04/20/17 1849 Last data filed at 04/20/17 1600  Gross per 24 hour  Intake            467.3 ml  Output             2300 ml  Net          -1832.7 ml   LBM: Last BM Date: 04/16/17 Baseline Weight: Weight: 94.3 kg (207 lb 14.3 oz) Most recent weight: Weight: 93.2 kg (205 lb 7.5 oz)       Palliative Assessment/Data:      Patient Active Problem List   Diagnosis Date Noted  . Acute on chronic combined systolic and diastolic heart failure ([place])   . Asthma, chronic, unspecified asthma severity, with acute exacerbation 04/11/2017  . Acute respiratory failure ([place]) 04/11/2017  . Hypoxia 04/04/2017  . Dyspnea 02/23/2017  . Acute pulmonary embolism ([place]) 02/23/2017  . Cardiomyopathy ([place])   . A-fib ([place]) 02/13/2017  . Metastatic breast cancer ([place])   . DCM (dilated cardiomyopathy) ([place])   . Atrial fibrillation with RVR ([place]) 02/11/2017  . Chest pain   . Adnexal cyst: Right per CT 12/09/2015 12/12/2015  . Chemotherapy-induced peripheral neuropathy ([place]) 12/05/2015  . Normocytic normochromic anemia 09/19/2015  . Breast cancer of upper-outer quadrant of left female breast ([place]) 09/07/2015    Palliative Care Assessment & Plan   Patient Profile: 46 y.o. female  with past medical history of metastatic breast cancer  with extensive pulmonary mets, h/o PE on lovenox, NICM with EF 25%, a-fib, asthma admitted on 04/11/2017 with acute hypoxic respiratory failure.  Cardiology has evaluated and currently on amio and levophed for a-fib and pressure support while diuresing.   She has widespread metastasis and with poor prognosis per oncology.  Plan is for trial of compassionate use of [place].  Pain remains poorly controlled and palliative consulted for pain management and goals of care.      Recommendations/Plan: Pain:  Cindy Bradshaw reports that her pain remains poorly controlled on PCA.  She was changed back to morphine Q2hours prn and reports good effect.  Plan to continue same.  Goals of Care and Additional Recommendations:  Limitations on Scope of Treatment: Full Scope Treatment- Her son was present in the room today and we reviewed her clinical course this admission.  He was aware of her cancer diagnosis, but he was not aware of her heart failure.  We discussed that she is very ill with multiple medical problems that cannot be cured and goal of therapies are to add as much time and quality to her life as possible.  She remains invested in full aggressive care.  Code Status:    Code Status Orders        Start     Ordered   04/11/17 1831  Full code  Continuous     04/11/17 1831    Code Status History    Date Active Date Inactive Code Status Order ID Comments User Context   02/23/2017  8:44 AM 02/26/2017  6:00 PM Full Code [id number]  Radene Gunning, NP ED   02/11/2017  4:13 PM 02/14/2017  6:33 PM Full Code [id number]  Robbie Lis, MD Inpatient   02/21/2016  4:25 PM 02/22/2016 11:29 AM Full Code [id number]  Irene Limbo, MD Inpatient   01/21/2016  6:36 PM 01/25/2016  3:06 PM Full Code [id number]  Debbe Odea, MD ED   12/09/2015  1:58 AM 12/12/2015  4:32 PM Full Code [id number]  Ivor Costa, MD ED       Prognosis:  Guarded  Discharge Planning:  To Be Determined  Care plan was discussed with patient, son, RN    Thank you for allowing the Palliative Medicine Team to assist in the care of this patient.   Total Time 40 Prolonged Time Billed No      Greater than 50%  of this time was spent counseling and coordinating care related to the above assessment and plan.  Micheline Rough, MD  Please contact Palliative Medicine Team phone at [phone] for questions and concerns.

## 2017-04-20 NOTE — Progress Notes (Signed)
13cc of Fentanyl from PCA wasted in the sink with Lacinda Axon, RN. S.Gertrude Tarbet, RN

## 2017-04-20 NOTE — Progress Notes (Signed)
Progress Note  Patient Name: Cindy Bradshaw Date of Encounter: 04/20/2017  Primary Cardiologist: Dr. Radford Pax and Dr. Haroldine Laws for heart failure   Subjective   Has emesis bag in hand. Feels a little better today. Still SOB, still with CP.   Inpatient Medications    Scheduled Meds: . benzonatate  100 mg Oral TID  . Chlorhexidine Gluconate Cloth  6 each Topical Q0600  . enoxaparin  1 mg/kg Subcutaneous BID  . fentaNYL  50 mcg Transdermal Q72H  . fentaNYL   Intravenous Q4H  . ferrous sulfate  325 mg Oral TID WC  . furosemide  80 mg Intravenous Q6H  . levalbuterol  0.63 mg Nebulization TID  . metoprolol succinate  25 mg Oral Daily  . pantoprazole  40 mg Oral Q1200  . polyethylene glycol  17 g Oral Daily  . potassium chloride  20 mEq Oral BID  . predniSONE  50 mg Oral Q breakfast  . protein supplement shake  11 oz Oral BID BM  . senna  2 tablet Oral Daily  . sodium chloride flush  10-40 mL Intracatheter Q12H   Continuous Infusions: . amiodarone 30 mg/hr (04/20/17 0100)  . norepinephrine (LEVOPHED) Adult infusion    . ondansetron [facility]) IV Stopped (04/20/17 0617)   PRN Meds: acetaminophen **OR** acetaminophen, albuterol, ALPRAZolam, alum & mag hydroxide-simeth, diphenhydrAMINE, guaiFENesin-codeine, guaiFENesin-dextromethorphan, methocarbamol, morphine injection, naloxone **AND** sodium chloride flush, promethazine, sodium chloride flush   Vital Signs    Vitals:   04/20/17 0501 04/20/17 0502 04/20/17 0759 04/20/17 0800  BP:      Pulse:      Resp:  (!) 25 18   Temp: 98.1 F (36.7 C)   98.1 F (36.7 C)  TempSrc: Oral   Oral  SpO2:  95% 96%   Weight:      Height:        Intake/Output Summary (Last 24 hours) at 04/20/17 4709 Last data filed at 04/20/17 0502  Gross per 24 hour  Intake           1137.9 ml  Output             3900 ml  Net          -2762.1 ml   Filed Weights   04/16/17 0501 04/18/17 0504 04/20/17 0400  Weight: 219 lb 12.8 oz (99.7 kg) 212 lb  4.9 oz (96.3 kg) 205 lb 7.5 oz (93.2 kg)    Telemetry    AFIB at times 120-130 - Personally Reviewed  ECG    AFIB - Personally Reviewed  Physical Exam   GEN: No acute distress. Ill appearing.  Decreased hair Neck: No JVD Cardiac: irreg irreg tachy, no murmurs, rubs, or gallops.  Respiratory: Clear to auscultation bilaterally. GI: Soft, nontender, non-distended  MS: No edema; No deformity. Neuro:  Nonfocal  Psych: Normal affect   Labs    Chemistry Recent Labs Lab 04/17/17 0436 04/18/17 0640 04/19/17 0250  NA 133* 132* 132*  K 4.2 4.1 3.9  CL 95* 92* 88*  CO2 30 33* 32  GLUCOSE 95 99 110*  BUN 18 20 22*  CREATININE 0.63 0.71 0.86  CALCIUM 9.8 9.9 9.8  GFRNONAA >60 >60 >60  GFRAA >60 >60 >60  ANIONGAP 8 7 12      Hematology Recent Labs Lab 04/17/17 0436 04/18/17 0640 04/19/17 0250  WBC 10.0 9.9 12.6*  RBC 3.76* 3.67* 3.87  HGB 10.6* 10.1* 10.8*  HCT 33.3* 32.0* 33.4*  MCV 88.6 87.2 86.3  MCH 28.2 27.5 27.9  MCHC 31.8 31.6 32.3  RDW 18.8* 18.7* 18.6*  PLT 561* 514* 550*    Cardiac EnzymesNo results for input(s): TROPONINI in the last 168 hours. No results for input(s): TROPIPOC in the last 168 hours.   BNPNo results for input(s): BNP, PROBNP in the last 168 hours.   DDimer No results for input(s): DDIMER in the last 168 hours.   Radiology    No results found.  Cardiac Studies   EF 25-30 from 60% Cath no CAD - atypical CP  Patient Profile     46 y.o. female with a hx of Metastatic breast cancer with extensive pulmonary metastases, history of recentPE on full dose Lovenox, NICMwith EF 25%, PAF, asthmawith atrial fibrillation  Assessment & Plan    Breast cancer  - Stage 4, per onc.   - Palliative care team involved. Continue with aggressive care.   - Pain has been an issue but improved  AFIB  - continue with IV amio  - Change to PO after hopeful DCCV on Monday  - Had to cancel DCCV Friday because of nausea and hypotension  Acute on  chronic combined systolic and diastolic HF  - appears more comfortable today  - Wt 205 from 212 from 219  - out 5 liters  - Creat 0.86 stable.  - BP stable.   - Continue with IV lasix 80 Q6 today. Likely change to PO tomorrow.    Signed, Candee Furbish, MD  04/20/2017, 8:22 AM

## 2017-04-20 NOTE — Progress Notes (Signed)
PROGRESS NOTE    [name] [name]  KZL:[id number] DOB: 29-Jan-1971 DOA: 04/11/2017 PCP: [name], MD  Brief Narrative: Cindy Bradshaw. Cindy Bradshaw is a 46 y.o. female with medical history significant of metastatic breast cancer with extensive pulmonary metastases, H/o PE on full dose lovenox, nonischemic cardiomyopathy ejection fraction of 25%, paroxysmal atrial fibrillation, asthma, presented to the ED with worsening shortness of breath, pt reports that this is gradually progressive over 1-2weeks, she was just started on O2 at 2L at her last visit with [name] and given reduced dose [place](chemo).She also reported low grade fevers with cough for last 1-2days, temperature upto 100.9 8/9 at home and some wheezing. CT chest with worsening pulm mets and small effusions  Assessment & Plan:   Acute hypoxic respiratory failure -Multifactorial, secondary to progression of her pulmonary metastatic disease, CHF also contributing -Prednisone taper. Change dose to 40 mg tomorrow -CTA chest notes progression of Pulm mets and small effusion.  -Appreciate [name]'s input, prognosis is poor, [name] plans to try Immunotherapy after discharge -Appreciate cardiology assistance. Continue with IV lasix. Negative 5 L.  Report some improvement of dyspnea.  Continue with tessalon pear. Will add robitussin with codein.    Acute on chronic systolic CHF/Nonischemic cardiomyopathy -EF 25% -Felt to be related to Adriamycin/chemotherapy, vs tachycardia induce.  -BP improved, continue with IV lasix.  -appreciate cardiology help.  -negative 5 l.  Notice some improvement of dyspnea.   Metastatic breast cancer with extensive pulmonary metastases -On palliative chemotherapy by [name] -Palliative care consult for goals of care and symptoms management.  -patient wants to be full code and continue with aggressive care.   H/o PE -on full dose lovenox -stable  P.Afib -with intermittent RVR, pulm disease  from mets contributing -Already on full dose anticoagulation for history of PE -on IV amiodarone, BP improved.  -Plan for cardioversion on Monday.   Chronic pain -Related to metastatic cancer  -Appreciate [name] help.  -fentanyl not helping/ will change morphine to Q 2 hour , await rec by palliative.   Nausea, vomiting; after coughing  She had BM yesterday. Abdomen mild tenderness. No rigidity.  IV Zofran, will add phenergan./  KUB with moderate amount of stool.  Start bowel regimen   DVT prophylaxis: on full dose Lovenox Code Status:  full code Family Communication: No family at bedside Disposition Plan: possible cardioversion.   Consultants:   Onc [name]   Antimicrobials:   Rocephin/Zithromax 8/9-8/11  Cefdinir 8/11   Subjective: Still vomiting, but is just after she cough. Some abdominal discomfort due to persistent cough.   Objective: Vitals:   04/20/17 0759 04/20/17 0800 04/20/17 0912 04/20/17 1200  BP:  (!) 121/46  116/67  Pulse:  77 (!) 124 85  Resp: 18 (!) 25 (!) 22 (!) 22  Temp:  98.1 F (36.7 C)    TempSrc:  Oral    SpO2: 96% 93% 93% 93%  Weight:      Height:        Intake/Output Summary (Last 24 hours) at 04/20/17 1308 Last data filed at 04/20/17 1200  Gross per 24 hour  Intake            948.1 ml  Output             4350 ml  Net          -3401.9 ml   Filed Weights   04/16/17 0501 04/18/17 0504 04/20/17 0400  Weight: 99.7 kg (219 lb 12.8 oz) 96.3 kg (212 lb 4.9  oz) 93.2 kg (205 lb 7.5 oz)    Examination:  Gen: obese, chronic ill appearing  Lungs: normal respiratory effort, bilateral crackles.  CVS: S 1, S 2 IRR Abd;  Soft, mild tenderness.  Extremities: No cyanosis.  Skin; No rash.    Data Reviewed:   CBC:  Recent Labs Lab 04/15/17 0447 04/17/17 0436 04/18/17 0640 04/19/17 0250 04/20/17 0744  WBC 13.2* 10.0 9.9 12.6* 13.5*  HGB 10.1* 10.6* 10.1* 10.8* 10.2*  HCT 32.1* 33.3* 32.0* 33.4* 32.1*  MCV 88.2 88.6 87.2  86.3 86.8  PLT 587* 561* 514* 550* 462*   Basic Metabolic Panel:  Recent Labs Lab 04/15/17 0447 04/17/17 0436 04/18/17 0640 04/19/17 0250 04/20/17 0744  NA 136 133* 132* 132* 130*  K 4.8 4.2 4.1 3.9 3.8  CL 102 95* 92* 88* 85*  CO2 28 30 33* 32 35*  GLUCOSE 140* 95 99 110* 116*  BUN 21* 18 20 22* 20  CREATININE 0.71 0.63 0.71 0.86 1.09*  CALCIUM 10.0 9.8 9.9 9.8 10.3   GFR: Estimated Creatinine Clearance: 74.7 mL/min (A) (by C-G formula based on SCr of 1.09 mg/dL (H)). Liver Function Tests: No results for input(s): AST, ALT, ALKPHOS, BILITOT, PROT, ALBUMIN in the last 168 hours. No results for input(s): LIPASE, AMYLASE in the last 168 hours. No results for input(s): AMMONIA in the last 168 hours. Coagulation Profile: No results for input(s): INR, PROTIME in the last 168 hours. Cardiac Enzymes: No results for input(s): CKTOTAL, CKMB, CKMBINDEX, TROPONINI in the last 168 hours. BNP (last 3 results) No results for input(s): PROBNP in the last 8760 hours. HbA1C: No results for input(s): HGBA1C in the last 72 hours. CBG:  Recent Labs Lab 04/17/17 1219  GLUCAP 143*   Lipid Profile: No results for input(s): CHOL, HDL, LDLCALC, TRIG, CHOLHDL, LDLDIRECT in the last 72 hours. Thyroid Function Tests: No results for input(s): TSH, T4TOTAL, FREET4, T3FREE, THYROIDAB in the last 72 hours. Anemia Panel: No results for input(s): VITAMINB12, FOLATE, FERRITIN, TIBC, IRON, RETICCTPCT in the last 72 hours. Urine analysis:    Component Value Date/Time   COLORURINE YELLOW 04/11/2017 [place] 04/11/2017 1716   LABSPEC 1.006 04/11/2017 1716   LABSPEC 1.010 04/10/2016 1224   PHURINE 5.0 04/11/2017 [place] 04/11/2017 1716   GLUCOSEU Negative 04/10/2016 1224   HGBUR SMALL (A) 04/11/2017 1716   BILIRUBINUR NEGATIVE 04/11/2017 1716   BILIRUBINUR Negative 04/10/2016 1224   KETONESUR NEGATIVE 04/11/2017 1716   PROTEINUR NEGATIVE 04/11/2017 1716    UROBILINOGEN 0.2 04/10/2016 1224   NITRITE NEGATIVE 04/11/2017 1716   LEUKOCYTESUR NEGATIVE 04/11/2017 1716   LEUKOCYTESUR Negative 04/10/2016 1224   Sepsis Labs: @LABRCNTIP (procalcitonin:4,lacticidven:4)  ) Recent Results (from the past 240 hour(s))  Culture, blood (Routine x 2)     Status: None   Collection Time: 04/11/17  2:28 PM  Result Value Ref Range Status   Specimen Description BLOOD RIGHT ANTECUBITAL  Final   Special Requests   Final    BOTTLES DRAWN AEROBIC AND ANAEROBIC Blood Culture adequate volume   Culture   Final    NO GROWTH 5 DAYS Performed at [facility], [place] [address], [place], [place] [address]    Report Status 04/16/2017 FINAL  Final  Culture, blood (Routine x 2)     Status: None   Collection Time: 04/11/17  2:56 PM  Result Value Ref Range Status   Specimen Description BLOOD PORT  Final   Special Requests  Final    BOTTLES DRAWN AEROBIC AND ANAEROBIC Blood Culture adequate volume   Culture   Final    NO GROWTH 5 DAYS Performed at [facility], [place] [address], [place], [place] [address]    Report Status 04/16/2017 FINAL  Final  MRSA PCR Screening     Status: None   Collection Time: 04/11/17  7:17 PM  Result Value Ref Range Status   MRSA by PCR NEGATIVE NEGATIVE Final    Comment:        The GeneXpert MRSA Assay (FDA approved for NASAL specimens only), is one component of a comprehensive MRSA colonization surveillance program. It is not intended to diagnose MRSA infection nor to guide or monitor treatment for MRSA infections.          Radiology Studies: Dg Abd 1 View  Result Date: 04/20/2017 CLINICAL DATA:  Vomiting for 3 days.  Chemotherapy for breast cancer EXAM: ABDOMEN - 1 VIEW COMPARISON:  Staging CT 01/30/2017 FINDINGS: Diffuse formed stool, confluent throughout the left colon. No obstruction or rectal impaction. No concerning mass effect or calcification. Cholecystectomy clips. IMPRESSION: Moderate to large stool volume  without obstruction or rectal impaction. Electronically Signed   By: Monte Fantasia M.D.   On: 04/20/2017 08:32        Scheduled Meds: . benzonatate  100 mg Oral TID  . Chlorhexidine Gluconate Cloth  6 each Topical Q0600  . enoxaparin  1 mg/kg Subcutaneous BID  . fentaNYL  50 mcg Transdermal Q72H  . fentaNYL   Intravenous Q4H  . ferrous sulfate  325 mg Oral TID WC  . furosemide  80 mg Intravenous Q6H  . levalbuterol  0.63 mg Nebulization TID  . metoprolol succinate  25 mg Oral Daily  . pantoprazole  40 mg Oral Q1200  . polyethylene glycol  17 g Oral Daily  . potassium chloride  20 mEq Oral BID  . predniSONE  50 mg Oral Q breakfast  . protein supplement shake  11 oz Oral BID BM  . senna  2 tablet Oral Daily  . sodium chloride flush  10-40 mL Intracatheter Q12H   Continuous Infusions: . amiodarone 30 mg/hr (04/20/17 1200)  . norepinephrine (LEVOPHED) Adult infusion    . ondansetron (ZOFRAN) IV Stopped (04/20/17 1215)     LOS: 9 days    Time spent: 35 minutes.     Niel Hummer, Md [facility] Pager [phone]  If 7PM-7AM, please contact night-coverage [email or web address] Password [email or web address] 04/20/2017, 1:08 PM

## 2017-04-21 LAB — BASIC METABOLIC PANEL
Anion gap: 15 (ref 5–15)
BUN: 22 mg/dL — AB (ref 6–20)
CHLORIDE: 83 mmol/L — AB (ref 101–111)
CO2: 27 mmol/L (ref 22–32)
CREATININE: 0.94 mg/dL (ref 0.44–1.00)
Calcium: 8.3 mg/dL — ABNORMAL LOW (ref 8.9–10.3)
GFR calc Af Amer: >60 mL/min (ref >60)
GFR calc non Af Amer: >60 mL/min (ref >60)
Glucose, Bld: 448 mg/dL — ABNORMAL HIGH (ref 65–99)
POTASSIUM: 3.1 mmol/L — AB (ref 3.5–5.1)
Sodium: 125 mmol/L — ABNORMAL LOW (ref 135–145)

## 2017-04-21 LAB — CBC
HEMATOCRIT: 27.2 % — AB (ref 36.0–46.0)
Hemoglobin: 8.8 g/dL — ABNORMAL LOW (ref 12.0–15.0)
MCH: 28 pg (ref 26.0–34.0)
MCHC: 32.4 g/dL (ref 30.0–36.0)
MCV: 86.6 fL (ref 78.0–100.0)
Platelets: 528 10*3/uL — ABNORMAL HIGH (ref 150–400)
RBC: 3.14 MIL/uL — ABNORMAL LOW (ref 3.87–5.11)
RDW: 18.7 % — ABNORMAL HIGH (ref 11.5–15.5)
WBC: 12.1 10*3/uL — ABNORMAL HIGH (ref 4.0–10.5)

## 2017-04-21 LAB — GLUCOSE, CAPILLARY
Glucose-Capillary: 102 mg/dL — ABNORMAL HIGH (ref 65–99)
Glucose-Capillary: 131 mg/dL — ABNORMAL HIGH (ref 65–99)
Glucose-Capillary: 105 mg/dL — ABNORMAL HIGH (ref 65–99)

## 2017-04-21 MED ORDER — LACTULOSE 10 GM/15ML PO SOLN
20.0000 g | Freq: Two times a day (BID) | ORAL | Status: DC
  Administered 2017-04-21 – 2017-04-27 (×5): 20 g via ORAL
  Filled 2017-04-21 (×8): qty 30

## 2017-04-21 MED ORDER — POTASSIUM CHLORIDE CRYS ER 20 MEQ PO TBCR
40.0000 meq | EXTENDED_RELEASE_TABLET | Freq: Two times a day (BID) | ORAL | Status: DC
  Administered 2017-04-21 – 2017-04-22 (×4): 40 meq via ORAL
  Administered 2017-04-23: 20 meq via ORAL
  Administered 2017-04-23 – 2017-04-24 (×2): 40 meq via ORAL
  Filled 2017-04-21 (×7): qty 2

## 2017-04-21 NOTE — Progress Notes (Addendum)
Progress Note  Patient Name: Cindy Bradshaw Date of Encounter: 04/21/2017  Primary Cardiologist: Dr. Radford Pax and Dr. Haroldine Laws for heart failure   Subjective   Sleepy, more comfortable. Minimal CP (mets), minimal SOB, improved.   Inpatient Medications    Scheduled Meds: . benzonatate  100 mg Oral TID  . Chlorhexidine Gluconate Cloth  6 each Topical Q0600  . enoxaparin  1 mg/kg Subcutaneous BID  . fentaNYL  50 mcg Transdermal Q72H  . ferrous sulfate  325 mg Oral TID WC  . furosemide  80 mg Intravenous Q6H  . lactulose  20 g Oral BID  . levalbuterol  0.63 mg Nebulization TID  . metoprolol succinate  25 mg Oral Daily  . pantoprazole  40 mg Oral Q1200  . polyethylene glycol  17 g Oral BID  . potassium chloride  40 mEq Oral BID  . predniSONE  40 mg Oral Q breakfast  . protein supplement shake  11 oz Oral BID BM  . senna  2 tablet Oral Daily  . sodium chloride flush  10-40 mL Intracatheter Q12H   Continuous Infusions: . amiodarone 30 mg/hr (04/21/17 0300)  . norepinephrine (LEVOPHED) Adult infusion    . ondansetron [facility]) IV Stopped (04/21/17 0542)   PRN Meds: acetaminophen **OR** acetaminophen, albuterol, ALPRAZolam, alum & mag hydroxide-simeth, bisacodyl, diphenhydrAMINE, guaiFENesin-codeine, guaiFENesin-dextromethorphan, methocarbamol, morphine injection, promethazine, sodium chloride flush   Vital Signs    Vitals:   04/21/17 0431 04/21/17 0500 04/21/17 0600 04/21/17 0700  BP:  (!) 115/50 95/70 110/75  Pulse:  65 67 93  Resp:  (!) 24 (!) 22 (!) 29  Temp: 98.6 F (37 C)     TempSrc: Oral     SpO2:  91% 91% (!) 89%  Weight: 211 lb 13.8 oz (96.1 kg)     Height:        Intake/Output Summary (Last 24 hours) at 04/21/17 0819 Last data filed at 04/21/17 0542  Gross per 24 hour  Intake           1435.3 ml  Output             2300 ml  Net           -864.7 ml   Filed Weights   04/18/17 0504 04/20/17 0400 04/21/17 0431  Weight: 212 lb 4.9 oz (96.3 kg) 205 lb  7.5 oz (93.2 kg) 211 lb 13.8 oz (96.1 kg)    Telemetry    AFIB at times 120-130, brief NSR periods noted.  - Personally Reviewed  ECG    AFIB - Personally Reviewed  Physical Exam   GEN: No acute distress. Ill appearing.  Decreased hair Neck: No JVD Cardiac: irreg irreg tachy, no murmurs, rubs, or gallops.  Respiratory: Clear to auscultation bilaterally. GI: Soft, nontender, non-distended  MS: No edema; No deformity. Neuro:  Nonfocal  Psych: Normal affect   Labs    Chemistry  Recent Labs Lab 04/19/17 0250 04/20/17 0744 04/21/17 0500  NA 132* 130* 125*  K 3.9 3.8 3.1*  CL 88* 85* 83*  CO2 32 35* 27  GLUCOSE 110* 116* 448*  BUN 22* 20 22*  CREATININE 0.86 1.09* 0.94  CALCIUM 9.8 10.3 8.3*  GFRNONAA >60 59* >60  GFRAA >60 >60 >60  ANIONGAP 12 10 15      Hematology  Recent Labs Lab 04/19/17 0250 04/20/17 0744 04/21/17 0500  WBC 12.6* 13.5* 12.1*  RBC 3.87 3.70* 3.14*  HGB 10.8* 10.2* 8.8*  HCT 33.4* 32.1* 27.2*  MCV 86.3 86.8 86.6  MCH 27.9 27.6 28.0  MCHC 32.3 31.8 32.4  RDW 18.6* 18.5* 18.7*  PLT 550* 597* 528*    Cardiac EnzymesNo results for input(s): TROPONINI in the last 168 hours. No results for input(s): TROPIPOC in the last 168 hours.   BNPNo results for input(s): BNP, PROBNP in the last 168 hours.   DDimer No results for input(s): DDIMER in the last 168 hours.   Radiology    Dg Abd 1 View  Result Date: 04/20/2017 CLINICAL DATA:  Vomiting for 3 days.  Chemotherapy for breast cancer EXAM: ABDOMEN - 1 VIEW COMPARISON:  Staging CT 01/30/2017 FINDINGS: Diffuse formed stool, confluent throughout the left colon. No obstruction or rectal impaction. No concerning mass effect or calcification. Cholecystectomy clips. IMPRESSION: Moderate to large stool volume without obstruction or rectal impaction. Electronically Signed   By: Monte Fantasia M.D.   On: 04/20/2017 08:32    Cardiac Studies   EF 25-30 from 60% Cath no CAD - atypical CP  Patient  Profile     46 y.o. female with a hx of Metastatic breast cancer with extensive pulmonary metastases, history of recentPE on full dose Lovenox, NICMwith EF 25%, PAF, asthmawith atrial fibrillation  Assessment & Plan    Breast cancer  - Stage 4, per onc.   - Palliative care team involved. Continue with aggressive care.   - Pain has been an issue but improved with Q2 injections  AFIB  - she is showing brief conversions to NSR personally viewed on tele (yesterday eve for instance). Because of her transient autoconversions, it does not make sense to DCCV.   - continue with IV amio today and convert to PO 400 BID tomorrow for one week then 200 BID for one week after that then 200 QD thereafter.   - Continue with low dose metoprolol  - I think her ability to maintain NSR for prolonged periods of time will be challenging. Try our best with palliative measures, AMIO.   - Lovenox for anticoagulation.   Acute on chronic combined systolic and diastolic HF  - appears more comfortable today  - Wt 211 from 205 from 212 from 219  - out 5.8 liters. Seems to be feeling better after diuresis.   - Creat 0.94 stable.  - BP stable but soft  - Continue with IV lasix 80 Q6 today. Likely change to PO tomorrow, Monday.   - hypokalemia being repleted, 3.1  Will see again tomorrow.   Signed, Candee Furbish, MD  04/21/2017, 8:19 AM

## 2017-04-21 NOTE — Progress Notes (Signed)
PROGRESS NOTE    [name]  ZOX:[id number] DOB: Jul 03, 1971 DOA: 04/11/2017 PCP: [name], MD  Brief Narrative: Cindy Bradshaw. Cindy Bradshaw is a 46 y.o. female with medical history significant of metastatic breast cancer with extensive pulmonary metastases, H/o PE on full dose lovenox, nonischemic cardiomyopathy ejection fraction of 25%, paroxysmal atrial fibrillation, asthma, presented to the ED with worsening shortness of breath, pt reports that this is gradually progressive over 1-2weeks, she was just started on O2 at 2L at her last visit with [name] and given reduced dose [place](chemo).She also reported low grade fevers with cough for last 1-2days, temperature upto 100.9 8/9 at home and some wheezing. CT chest with worsening pulm mets and small effusions  Assessment & Plan:   Acute hypoxic respiratory failure -Multifactorial, secondary to progression of her pulmonary metastatic disease, CHF also contributing -Prednisone taper. Change dose to 40 mg tomorrow -CTA chest notes progression of Pulm mets and small effusion.  -Appreciate [name]'s input, prognosis is poor, [name] plans to try Immunotherapy after discharge -Appreciate cardiology assistance. Continue with IV lasix. Negative 5 L.  Report some improvement of dyspnea.  Continue with tessalon pear. Continue with  robitussin with codein.    Acute on chronic systolic CHF/Nonischemic cardiomyopathy -EF 25% -Felt to be related to Adriamycin/chemotherapy, vs tachycardia induce.  -BP improved, continue with IV lasix.  -appreciate cardiology help.  -negative 5 l.    Metastatic breast cancer with extensive pulmonary metastases -On palliative chemotherapy by [name] -Palliative care consult for goals of care and symptoms management.  -patient wants to be full code and continue with aggressive care.   H/o PE -on full dose lovenox -stable  P.Afib -with intermittent RVR, pulm disease from mets contributing -Already  on full dose anticoagulation for history of PE -on IV amiodarone, BP improved.  -Patient converted couple of time to sinus rhythm. No cardioversion per cardiology.  --plan to convert to oral amiodarone 8-20  Chronic pain -Related to metastatic cancer  -Appreciate [name] help.  -continue with morphine PRN   Nausea, vomiting; after coughing  She had BM yesterday. Abdomen mild tenderness. No rigidity.  IV Zofran, will add phenergan./  KUB with moderate amount of stool.  Continue with senokot , miralax. Will add lactulose. ducolax suppository   Hyponatremia; pseudohyponatremia,ia. Sodium corrected by glucose at 133.   DVT prophylaxis: on full dose Lovenox Code Status:  full code Family Communication: No family at bedside Disposition Plan: possible cardioversion.   Consultants:   Onc [name]   Antimicrobials:   Rocephin/Zithromax 8/9-8/11  Cefdinir 8/11   Subjective: She is breathing better, coughing cream secretion.  Cough better with meds.    Objective: Vitals:   04/21/17 0431 04/21/17 0500 04/21/17 0600 04/21/17 0700  BP:  (!) 115/50 95/70 110/75  Pulse:  65 67 93  Resp:  (!) 24 (!) 22 (!) 29  Temp: 98.6 F (37 C)     TempSrc: Oral     SpO2:  91% 91% (!) 89%  Weight: 96.1 kg (211 lb 13.8 oz)     Height:        Intake/Output Summary (Last 24 hours) at 04/21/17 0753 Last data filed at 04/21/17 0542  Gross per 24 hour  Intake             1462 ml  Output             2300 ml  Net             -838 ml  Filed Weights   04/18/17 0504 04/20/17 0400 04/21/17 0431  Weight: 96.3 kg (212 lb 4.9 oz) 93.2 kg (205 lb 7.5 oz) 96.1 kg (211 lb 13.8 oz)    Examination:  Gen: no acute distress. Chronically ill appearing.  Lungs: Normal Respiratory effort, bilateral decrease breath sounds.  CVS: S 1, S 2 RRR Abd;  Soft, nt, nd Extremities: No cyanosis.  Skin; No rash.    Data Reviewed:   CBC:  Recent Labs Lab 04/17/17 0436 04/18/17 0640 04/19/17 0250  04/20/17 0744 04/21/17 0500  WBC 10.0 9.9 12.6* 13.5* 12.1*  HGB 10.6* 10.1* 10.8* 10.2* 8.8*  HCT 33.3* 32.0* 33.4* 32.1* 27.2*  MCV 88.6 87.2 86.3 86.8 86.6  PLT 561* 514* 550* 597* 474*   Basic Metabolic Panel:  Recent Labs Lab 04/17/17 0436 04/18/17 0640 04/19/17 0250 04/20/17 0744 04/21/17 0500  NA 133* 132* 132* 130* 125*  K 4.2 4.1 3.9 3.8 3.1*  CL 95* 92* 88* 85* 83*  CO2 30 33* 32 35* 27  GLUCOSE 95 99 110* 116* 448*  BUN 18 20 22* 20 22*  CREATININE 0.63 0.71 0.86 1.09* 0.94  CALCIUM 9.8 9.9 9.8 10.3 8.3*   GFR: Estimated Creatinine Clearance: 88.1 mL/min (by C-G formula based on SCr of 0.94 mg/dL). Liver Function Tests: No results for input(s): AST, ALT, ALKPHOS, BILITOT, PROT, ALBUMIN in the last 168 hours. No results for input(s): LIPASE, AMYLASE in the last 168 hours. No results for input(s): AMMONIA in the last 168 hours. Coagulation Profile: No results for input(s): INR, PROTIME in the last 168 hours. Cardiac Enzymes: No results for input(s): CKTOTAL, CKMB, CKMBINDEX, TROPONINI in the last 168 hours. BNP (last 3 results) No results for input(s): PROBNP in the last 8760 hours. HbA1C: No results for input(s): HGBA1C in the last 72 hours. CBG:  Recent Labs Lab 04/17/17 1219 04/21/17 0657  GLUCAP 143* 102*   Lipid Profile: No results for input(s): CHOL, HDL, LDLCALC, TRIG, CHOLHDL, LDLDIRECT in the last 72 hours. Thyroid Function Tests: No results for input(s): TSH, T4TOTAL, FREET4, T3FREE, THYROIDAB in the last 72 hours. Anemia Panel: No results for input(s): VITAMINB12, FOLATE, FERRITIN, TIBC, IRON, RETICCTPCT in the last 72 hours. Urine analysis:    Component Value Date/Time   COLORURINE YELLOW 04/11/2017 [place] 04/11/2017 1716   LABSPEC 1.006 04/11/2017 1716   LABSPEC 1.010 04/10/2016 1224   PHURINE 5.0 04/11/2017 [place] 04/11/2017 1716   GLUCOSEU Negative 04/10/2016 1224   HGBUR SMALL (A) 04/11/2017  1716   BILIRUBINUR NEGATIVE 04/11/2017 1716   BILIRUBINUR Negative 04/10/2016 1224   KETONESUR NEGATIVE 04/11/2017 1716   PROTEINUR NEGATIVE 04/11/2017 1716   UROBILINOGEN 0.2 04/10/2016 1224   NITRITE NEGATIVE 04/11/2017 1716   LEUKOCYTESUR NEGATIVE 04/11/2017 1716   LEUKOCYTESUR Negative 04/10/2016 1224   Sepsis Labs: @LABRCNTIP (procalcitonin:4,lacticidven:4)  ) Recent Results (from the past 240 hour(s))  Culture, blood (Routine x 2)     Status: None   Collection Time: 04/11/17  2:28 PM  Result Value Ref Range Status   Specimen Description BLOOD RIGHT ANTECUBITAL  Final   Special Requests   Final    BOTTLES DRAWN AEROBIC AND ANAEROBIC Blood Culture adequate volume   Culture   Final    NO GROWTH 5 DAYS Performed at [facility], [place] [address] [place], [place] [address]    Report Status 04/16/2017 FINAL  Final  Culture, blood (Routine x 2)     Status: None  Collection Time: 04/11/17  2:56 PM  Result Value Ref Range Status   Specimen Description BLOOD PORT  Final   Special Requests   Final    BOTTLES DRAWN AEROBIC AND ANAEROBIC Blood Culture adequate volume   Culture   Final    NO GROWTH 5 DAYS Performed at [facility], [address], [place], [place] [address]    Report Status 04/16/2017 FINAL  Final  MRSA PCR Screening     Status: None   Collection Time: 04/11/17  7:17 PM  Result Value Ref Range Status   MRSA by PCR NEGATIVE NEGATIVE Final    Comment:        The GeneXpert MRSA Assay (FDA approved for NASAL specimens only), is one component of a comprehensive MRSA colonization surveillance program. It is not intended to diagnose MRSA infection nor to guide or monitor treatment for MRSA infections.          Radiology Studies: Dg Abd 1 View  Result Date: 04/20/2017 CLINICAL DATA:  Vomiting for 3 days.  Chemotherapy for breast cancer EXAM: ABDOMEN - 1 VIEW COMPARISON:  Staging CT 01/30/2017 FINDINGS: Diffuse formed stool, confluent throughout  the left colon. No obstruction or rectal impaction. No concerning mass effect or calcification. Cholecystectomy clips. IMPRESSION: Moderate to large stool volume without obstruction or rectal impaction. Electronically Signed   By: Monte Fantasia M.D.   On: 04/20/2017 08:32        Scheduled Meds: . benzonatate  100 mg Oral TID  . Chlorhexidine Gluconate Cloth  6 each Topical Q0600  . enoxaparin  1 mg/kg Subcutaneous BID  . fentaNYL  50 mcg Transdermal Q72H  . ferrous sulfate  325 mg Oral TID WC  . furosemide  80 mg Intravenous Q6H  . lactulose  20 g Oral BID  . levalbuterol  0.63 mg Nebulization TID  . metoprolol succinate  25 mg Oral Daily  . pantoprazole  40 mg Oral Q1200  . polyethylene glycol  17 g Oral BID  . potassium chloride  40 mEq Oral BID  . predniSONE  40 mg Oral Q breakfast  . protein supplement shake  11 oz Oral BID BM  . senna  2 tablet Oral Daily  . sodium chloride flush  10-40 mL Intracatheter Q12H   Continuous Infusions: . amiodarone 30 mg/hr (04/21/17 0300)  . norepinephrine (LEVOPHED) Adult infusion    . ondansetron (ZOFRAN) IV Stopped (04/21/17 0542)     LOS: 10 days    Time spent: 35 minutes.     Niel Hummer, Md [facility] Pager [phone]  If 7PM-7AM, please contact night-coverage [email or web address] Password [facility] 04/21/2017, 7:53 AM

## 2017-04-21 NOTE — Progress Notes (Signed)
Daily Progress Note   Patient Name: Cindy Bradshaw       Date: 04/21/2017 DOB: Sep 01, 1971  Age: 46 y.o. MRN#: [id number] Attending Physician: Elmarie Shiley, MD Primary Care Physician: [name], MD Admit Date: 04/11/2017  Reason for Consultation/Follow-up: Establishing goals of care and Pain control  Subjective: Cindy Bradshaw reports That she is very tired today and does not feel like talking.  She states this is because people are constantly coming into her room in order to provide care and she can never get any sleep.  Pain has been better since utilizing PRN morphine.  Current pain is 3-4/10.    See below:  Length of Stay: 10  Current Medications: Scheduled Meds:  . benzonatate  100 mg Oral TID  . Chlorhexidine Gluconate Cloth  6 each Topical Q0600  . enoxaparin  1 mg/kg Subcutaneous BID  . fentaNYL  50 mcg Transdermal Q72H  . ferrous sulfate  325 mg Oral TID WC  . furosemide  80 mg Intravenous Q6H  . lactulose  20 g Oral BID  . levalbuterol  0.63 mg Nebulization TID  . metoprolol succinate  25 mg Oral Daily  . pantoprazole  40 mg Oral Q1200  . polyethylene glycol  17 g Oral BID  . potassium chloride  40 mEq Oral BID  . predniSONE  40 mg Oral Q breakfast  . protein supplement shake  11 oz Oral BID BM  . senna  2 tablet Oral Daily  . sodium chloride flush  10-40 mL Intracatheter Q12H    Continuous Infusions: . amiodarone 30 mg/hr (04/21/17 1600)  . norepinephrine (LEVOPHED) Adult infusion    . ondansetron [facility]) IV Stopped (04/21/17 1208)    PRN Meds: acetaminophen **OR** acetaminophen, albuterol, ALPRAZolam, alum & mag hydroxide-simeth, bisacodyl, diphenhydrAMINE, guaiFENesin-codeine, guaiFENesin-dextromethorphan, methocarbamol, morphine injection,  promethazine, sodium chloride flush  Physical Exam    General: Alert, awake, in no acute distress.  HEENT: No bruits, no goiter, no JVD Heart: Regular rate and rhythm. No murmur appreciated. Lungs: Good air movement, clear Abdomen: Soft, nontender, nondistended, positive bowel sounds.  Ext: No significant edema Skin: Warm and dry Neuro: Grossly intact, nonfocal.        Vital Signs: BP (!) 111/44   Pulse 99   Temp 98.7 F (37.1 C) (Oral)   Resp 18  Ht 5\' 7"  (1.702 m)   Wt 96.1 kg (211 lb 13.8 oz)   SpO2 95%   BMI 33.18 kg/m  SpO2: SpO2: 95 % O2 Device: O2 Device: Not Delivered O2 Flow Rate: O2 Flow Rate (L/min): 21 L/min  Intake/output summary:   Intake/Output Summary (Last 24 hours) at 04/21/17 1709 Last data filed at 04/21/17 1600  Gross per 24 hour  Intake             1712 ml  Output             2400 ml  Net             -688 ml   LBM: Last BM Date: 04/21/17 Baseline Weight: Weight: 94.3 kg (207 lb 14.3 oz) Most recent weight: Weight: 96.1 kg (211 lb 13.8 oz)       Palliative Assessment/Data:      Patient Active Problem List   Diagnosis Date Noted  . Acute on chronic combined systolic and diastolic heart failure ([place])   . Asthma, chronic, unspecified asthma severity, with acute exacerbation 04/11/2017  . Acute respiratory failure ([employer]) 04/11/2017  . Hypoxia 04/04/2017  . Dyspnea 02/23/2017  . Acute pulmonary embolism ([place]) 02/23/2017  . Cardiomyopathy ([place])   . A-fib ([place]) 02/13/2017  . Metastatic breast cancer ([place])   . DCM (dilated cardiomyopathy) ([place])   . Atrial fibrillation with RVR ([place]) 02/11/2017  . Chest pain   . Adnexal cyst: Right per CT 12/09/2015 12/12/2015  . Chemotherapy-induced peripheral neuropathy ([place]) 12/05/2015  . Normocytic normochromic anemia 09/19/2015  . Breast cancer of upper-outer quadrant of left female breast ([place]) 09/07/2015    Palliative Care Assessment & Plan   Patient Profile: 46 y.o. female  with past medical  history of metastatic breast cancer with extensive pulmonary mets, h/o PE on lovenox, NICM with EF 25%, a-fib, asthma admitted on 04/11/2017 with acute hypoxic respiratory failure.  Cardiology has evaluated and currently on amio and levophed for a-fib and pressure support while diuresing.   She has widespread metastasis and with poor prognosis per oncology.  Plan is for trial of compassionate use of [place].  Pain remains poorly controlled and palliative consulted for pain management and goals of care.      Recommendations/Plan: Pain:  Changed back to morphine Q2hours prn yesterday with good effect.  Plan to continue same tonight and will transition to oral rescue medication tomorrow.  Goals of Care and Additional Recommendations: Limitations on Scope of Treatment: Full Scope Treatment- Reports tired today and does not wish to discuss further. Code Status:    Code Status Orders        Start     Ordered   04/11/17 1831  Full code  Continuous     04/11/17 1831    Code Status History    Date Active Date Inactive Code Status Order ID Comments User Context   02/23/2017  8:44 AM 02/26/2017  6:00 PM Full Code [id number]  Radene Gunning, NP ED   02/11/2017  4:13 PM 02/14/2017  6:33 PM Full Code [id number]  Robbie Lis, MD Inpatient   02/21/2016  4:25 PM 02/22/2016 11:29 AM Full Code [id number]  Irene Limbo, MD Inpatient   01/21/2016  6:36 PM 01/25/2016  3:06 PM Full Code [id number]  Debbe Odea, MD ED   12/09/2015  1:58 AM 12/12/2015  4:32 PM Full Code [id number]  Ivor Costa, MD ED       Prognosis:  Guarded  Discharge Planning:  To  Be Determined  Care plan was discussed with patient, RN   Thank you for allowing the Palliative Medicine Team to assist in the care of this patient.   Total Time 20 Prolonged Time Billed No      Greater than 50%  of this time was spent counseling and coordinating care related to the above assessment and plan.  Micheline Rough, MD  Please contact Palliative  Medicine Team phone at [phone] for questions and concerns.

## 2017-04-22 ENCOUNTER — Inpatient Hospital Stay (HOSPITAL_COMMUNITY): Payer: Federal, State, Local not specified - PPO

## 2017-04-22 ENCOUNTER — Encounter: Payer: Self-pay | Admitting: Pharmacy Technician

## 2017-04-22 ENCOUNTER — Encounter (HOSPITAL_COMMUNITY)
Admission: EM | Disposition: A | Discharge status: Home Health | Payer: Self-pay | Source: Home / Self Care | Attending: Internal Medicine

## 2017-04-22 LAB — GLUCOSE, CAPILLARY
Glucose-Capillary: 94 mg/dL (ref 65–99)
Glucose-Capillary: 122 mg/dL — ABNORMAL HIGH (ref 65–99)
Glucose-Capillary: 106 mg/dL — ABNORMAL HIGH (ref 65–99)

## 2017-04-22 LAB — BASIC METABOLIC PANEL
Anion gap: 11 (ref 5–15)
BUN: 21 mg/dL — AB (ref 6–20)
CO2: 31 mmol/L (ref 22–32)
CREATININE: 1.03 mg/dL — AB (ref 0.44–1.00)
Calcium: 9.7 mg/dL (ref 8.9–10.3)
Chloride: 89 mmol/L — ABNORMAL LOW (ref 101–111)
GFR calc Af Amer: >60 mL/min (ref >60)
GLUCOSE: 98 mg/dL (ref 65–99)
POTASSIUM: 3.8 mmol/L (ref 3.5–5.1)
Sodium: 131 mmol/L — ABNORMAL LOW (ref 135–145)

## 2017-04-22 LAB — CBC
HCT: 28.6 % — ABNORMAL LOW (ref 36.0–46.0)
Hemoglobin: 9.2 g/dL — ABNORMAL LOW (ref 12.0–15.0)
MCH: 27.8 pg (ref 26.0–34.0)
MCHC: 32.2 g/dL (ref 30.0–36.0)
MCV: 86.4 fL (ref 78.0–100.0)
PLATELETS: 495 10*3/uL — AB (ref 150–400)
RBC: 3.31 MIL/uL — ABNORMAL LOW (ref 3.87–5.11)
RDW: 18.7 % — AB (ref 11.5–15.5)
WBC: 15.8 10*3/uL — ABNORMAL HIGH (ref 4.0–10.5)

## 2017-04-22 SURGERY — CARDIOVERSION
Anesthesia: General

## 2017-04-22 MED ORDER — PREDNISONE 20 MG PO TABS
20.0000 mg | ORAL_TABLET | Freq: Every day | ORAL | Status: DC
  Administered 2017-04-22 – 2017-04-26 (×5): 20 mg via ORAL
  Filled 2017-04-22 (×5): qty 1

## 2017-04-22 MED ORDER — SODIUM CHLORIDE 0.9 % IV BOLUS (SEPSIS)
500.0000 mL | Freq: Once | INTRAVENOUS | Status: AC
  Administered 2017-04-22: 500 mL via INTRAVENOUS

## 2017-04-22 MED ORDER — CHLORHEXIDINE GLUCONATE CLOTH 2 % EX PADS
6.0000 | MEDICATED_PAD | Freq: Every day | CUTANEOUS | Status: DC
  Administered 2017-04-23: 6 via TOPICAL

## 2017-04-22 MED ORDER — MORPHINE SULFATE (PF) 2 MG/ML IV SOLN
2.0000 mg | INTRAVENOUS | Status: DC | PRN
  Administered 2017-04-22 (×2): 2 mg via INTRAVENOUS
  Filled 2017-04-22 (×2): qty 1

## 2017-04-22 MED ORDER — AMIODARONE HCL 200 MG PO TABS
400.0000 mg | ORAL_TABLET | Freq: Two times a day (BID) | ORAL | Status: DC
  Administered 2017-04-22 – 2017-04-27 (×11): 400 mg via ORAL
  Filled 2017-04-22 (×11): qty 2

## 2017-04-22 MED ORDER — MORPHINE SULFATE 15 MG PO TABS
7.5000 mg | ORAL_TABLET | ORAL | Status: DC | PRN
  Administered 2017-04-22 (×2): 7.5 mg via ORAL
  Filled 2017-04-22 (×2): qty 1

## 2017-04-22 MED ORDER — FUROSEMIDE 40 MG PO TABS
120.0000 mg | ORAL_TABLET | Freq: Two times a day (BID) | ORAL | Status: DC
  Administered 2017-04-22 – 2017-04-27 (×10): 120 mg via ORAL
  Filled 2017-04-22 (×10): qty 3

## 2017-04-22 MED ORDER — LEVALBUTEROL HCL 0.63 MG/3ML IN NEBU
0.6300 mg | INHALATION_SOLUTION | Freq: Two times a day (BID) | RESPIRATORY_TRACT | Status: DC
  Administered 2017-04-22 – 2017-04-24 (×5): 0.63 mg via RESPIRATORY_TRACT
  Filled 2017-04-22 (×5): qty 3

## 2017-04-22 MED ORDER — MORPHINE SULFATE 15 MG PO TABS: 7.5000 mg | ORAL_TABLET | ORAL | Status: DC | PRN

## 2017-04-22 NOTE — Care Management Note (Signed)
Case Management Note  Patient Details  Name: Birgitta K. Salonga MRN: [id number] Date of Birth: 1970-12-05  Subjective/Objective:    Hypotension and iv levophed drip                Action/Plan: Date:  April 22, 2017 Chart reviewed for concurrent status and case management needs. Will continue to follow patient progress. Discharge Planning: following for needs Expected discharge date: [id number] Velva Harman, BSN, [place], [place]  Expected Discharge Date:   (unknown)               Expected Discharge Plan:  [place]  In-House Referral:     Discharge planning Services  CM Consult  Post Acute Care Choice:  Durable Medical Equipment (Home 02-[facility] has travel tank) Choice offered to:  Patient  DME Arranged:    DME Agency:     HH Arranged:  PT [place]:  [place]  Status of Service:  In process, will continue to follow  If discussed at Long Length of Stay Meetings, dates discussed:    Additional Comments:  [name], RN 04/22/2017, 9:08 AM

## 2017-04-22 NOTE — Progress Notes (Signed)
I will meet with the patient to sign the drug assist application 02/04/78 for Keytruda. Assistance based on Off Label use.

## 2017-04-22 NOTE — Progress Notes (Signed)
PT Cancellation Note  Patient Details Name: Cindy Bradshaw MRN: [id number] DOB: 1971-01-09   Cancelled Treatment:    Reason Eval/Treat Not Completed: Attempted PT tx session-pt declined participation on today due to fatigue. Will check back another day.    Weston Anna, MPT Pager: [phone]

## 2017-04-22 NOTE — Progress Notes (Signed)
PROGRESS NOTE    Cindy Bradshaw  SAY:[id number] DOB: 1971-01-02 DOA: 04/11/2017 PCP: [name], MD  Brief Narrative: Cindy Bradshaw is a 46 y.o. female with medical history significant of metastatic breast cancer with extensive pulmonary metastases, H/o PE on full dose lovenox, nonischemic cardiomyopathy ejection fraction of 25%, paroxysmal atrial fibrillation, asthma, presented to the ED with worsening shortness of breath, pt reports that this is gradually progressive over 1-2weeks, she was just started on O2 at 2L at her last visit with [name] and given reduced dose [place](chemo).She also reported low grade fevers with cough for last 1-2days, temperature upto 100.9 8/9 at home and some wheezing. CT chest with worsening pulm mets and small effusions  Assessment & Plan:   Acute hypoxic respiratory failure -Multifactorial, secondary to progression of her pulmonary metastatic disease, CHF also contributing -Prednisone taper. Change dose to 20 mg tomorrow -CTA chest notes progression of Pulm mets and small effusion.  -Appreciate [name]'s input, prognosis is poor, [name] plans to try Immunotherapy after discharge -Appreciate cardiology assistance. . Negative 5.7 L.  Report some improvement of dyspnea.  Continue with tessalon pear. Continue with  robitussin with codein.  Monitor WBC, will taper prednisone. Patient has been afebrile.   Acute on chronic systolic CHF/Nonischemic cardiomyopathy -EF 25% -Felt to be related to Adriamycin/chemotherapy, vs tachycardia induce.  -appreciate cardiology help.  -negative 5 l.  -Plan to change IV lasix to oral.   Metastatic breast cancer with extensive pulmonary metastases -On palliative chemotherapy by [name] -Palliative care consult for goals of care and symptoms management.  -patient wants to be full code and continue with aggressive care.   H/o PE -on full dose lovenox -stable  P.Afib -with intermittent RVR, pulm  disease from mets contributing -Already on full dose anticoagulation for history of PE -on IV amiodarone, BP improved.  -Patient converted couple of time to sinus rhythm. No cardioversion per cardiology.  --plan to convert to oral amiodarone 8-20  Chronic pain -Related to metastatic cancer  -Appreciate [name] help.  -continue with morphine PRN   Nausea, vomiting; after coughing  She had BM yesterday. Abdomen mild tenderness. No rigidity.  IV Zofran, will add phenergan./  KUB with moderate amount of stool.  Continue with senokot , miralax.  Had BM. No abdominal pain.   Hyponatremia; and pseudohyponatremia,. Sodium corrected by glucose at 133.  Stable.   Leukocytosis; increasing. She completed course of antibiotics. Will check chest x ray. Will taper prednisone.   DVT prophylaxis: on full dose Lovenox Code Status:  full code Family Communication: No family at bedside Disposition Plan: change IV medications to oral.   Consultants:   Onc [name]   Antimicrobials:   Rocephin/Zithromax 8/9-8/11  Cefdinir 8/11   Subjective: She is breathing better. Cough persist. Denies abdominal pain./     Objective: Vitals:   04/22/17 0500 04/22/17 0600 04/22/17 0800 04/22/17 0807  BP: (!) 96/37 (!) 95/49    Pulse: 88 88    Resp: (!) 24 (!) 25    Temp:    99.3 F (37.4 C)  TempSrc:      SpO2: 99% 100%    Weight:   92.4 kg (203 lb 11.3 oz)   Height:        Intake/Output Summary (Last 24 hours) at 04/22/17 0809 Last data filed at 04/22/17 0700  Gross per 24 hour  Intake           1709.4 ml  Output  1700 ml  Net              9.4 ml   Filed Weights   04/20/17 0400 04/21/17 0431 04/22/17 0800  Weight: 93.2 kg (205 lb 7.5 oz) 96.1 kg (211 lb 13.8 oz) 92.4 kg (203 lb 11.3 oz)    Examination:  Gen: NAD Lungs: Normal respiratory effort, no wheezing.  CVS: S 1, S 2 RRR Abd;  Soft, nt, nd Extremities: No cyanosis.  Skin; No rash.    Data Reviewed:    CBC:  Recent Labs Lab 04/18/17 0640 04/19/17 0250 04/20/17 0744 04/21/17 0500 04/22/17 0649  WBC 9.9 12.6* 13.5* 12.1* 15.8*  HGB 10.1* 10.8* 10.2* 8.8* 9.2*  HCT 32.0* 33.4* 32.1* 27.2* 28.6*  MCV 87.2 86.3 86.8 86.6 86.4  PLT 514* 550* 597* 528* 315*   Basic Metabolic Panel:  Recent Labs Lab 04/18/17 0640 04/19/17 0250 04/20/17 0744 04/21/17 0500 04/22/17 0649  NA 132* 132* 130* 125* 131*  K 4.1 3.9 3.8 3.1* 3.8  CL 92* 88* 85* 83* 89*  CO2 33* 32 35* 27 31  GLUCOSE 99 110* 116* 448* 98  BUN 20 22* 20 22* 21*  CREATININE 0.71 0.86 1.09* 0.94 1.03*  CALCIUM 9.9 9.8 10.3 8.3* 9.7   GFR: Estimated Creatinine Clearance: 78.8 mL/min (A) (by C-G formula based on SCr of 1.03 mg/dL (H)). Liver Function Tests: No results for input(s): AST, ALT, ALKPHOS, BILITOT, PROT, ALBUMIN in the last 168 hours. No results for input(s): LIPASE, AMYLASE in the last 168 hours. No results for input(s): AMMONIA in the last 168 hours. Coagulation Profile: No results for input(s): INR, PROTIME in the last 168 hours. Cardiac Enzymes: No results for input(s): CKTOTAL, CKMB, CKMBINDEX, TROPONINI in the last 168 hours. BNP (last 3 results) No results for input(s): PROBNP in the last 8760 hours. HbA1C: No results for input(s): HGBA1C in the last 72 hours. CBG:  Recent Labs Lab 04/17/17 1219 04/21/17 0657 04/21/17 1402 04/21/17 2124  GLUCAP 143* 102* 131* 105*   Lipid Profile: No results for input(s): CHOL, HDL, LDLCALC, TRIG, CHOLHDL, LDLDIRECT in the last 72 hours. Thyroid Function Tests: No results for input(s): TSH, T4TOTAL, FREET4, T3FREE, THYROIDAB in the last 72 hours. Anemia Panel: No results for input(s): VITAMINB12, FOLATE, FERRITIN, TIBC, IRON, RETICCTPCT in the last 72 hours. Urine analysis:    Component Value Date/Time   COLORURINE YELLOW 04/11/2017 [place] 04/11/2017 1716   LABSPEC 1.006 04/11/2017 1716   LABSPEC 1.010 04/10/2016 1224   PHURINE  5.0 04/11/2017 1716   GLUCOSEU NEGATIVE 04/11/2017 1716   GLUCOSEU Negative 04/10/2016 1224   HGBUR SMALL (A) 04/11/2017 1716   BILIRUBINUR NEGATIVE 04/11/2017 1716   BILIRUBINUR Negative 04/10/2016 1224   KETONESUR NEGATIVE 04/11/2017 1716   PROTEINUR NEGATIVE 04/11/2017 1716   UROBILINOGEN 0.2 04/10/2016 1224   NITRITE NEGATIVE 04/11/2017 1716   LEUKOCYTESUR NEGATIVE 04/11/2017 1716   LEUKOCYTESUR Negative 04/10/2016 1224   Sepsis Labs: @LABRCNTIP (procalcitonin:4,lacticidven:4)  ) No results found for this or any previous visit (from the past 240 hour(s)).       Radiology Studies: Dg Abd 1 View  Result Date: 04/20/2017 CLINICAL DATA:  Vomiting for 3 days.  Chemotherapy for breast cancer EXAM: ABDOMEN - 1 VIEW COMPARISON:  Staging CT 01/30/2017 FINDINGS: Diffuse formed stool, confluent throughout the left colon. No obstruction or rectal impaction. No concerning mass effect or calcification. Cholecystectomy clips. IMPRESSION: Moderate to large stool volume without obstruction or rectal impaction. Electronically Signed  By: Monte Fantasia M.D.   On: 04/20/2017 08:32        Scheduled Meds: . amiodarone  400 mg Oral BID  . benzonatate  100 mg Oral TID  . Chlorhexidine Gluconate Cloth  6 each Topical Q0600  . enoxaparin  1 mg/kg Subcutaneous BID  . fentaNYL  50 mcg Transdermal Q72H  . ferrous sulfate  325 mg Oral TID WC  . furosemide  80 mg Intravenous Q6H  . furosemide  120 mg Oral BID  . lactulose  20 g Oral BID  . levalbuterol  0.63 mg Nebulization TID  . metoprolol succinate  25 mg Oral Daily  . pantoprazole  40 mg Oral Q1200  . polyethylene glycol  17 g Oral BID  . potassium chloride  40 mEq Oral BID  . predniSONE  20 mg Oral Q breakfast  . protein supplement shake  11 oz Oral BID BM  . senna  2 tablet Oral Daily  . sodium chloride flush  10-40 mL Intracatheter Q12H   Continuous Infusions: . norepinephrine (LEVOPHED) Adult infusion    . ondansetron (ZOFRAN)  IV 8 mg (04/22/17 0700)     LOS: 11 days    Time spent: 35 minutes.     Niel Hummer, Md [facility] Pager [phone]  If 7PM-7AM, please contact night-coverage [email or web address] Password [email or web address] 04/22/2017, 8:09 AM

## 2017-04-22 NOTE — Progress Notes (Signed)
HEMATOLOGY-ONCOLOGY PROGRESS NOTE  SUBJECTIVE: Persistent cough and shortness of breath but improving gradually with cardiac treatments  OBJECTIVE: REVIEW OF SYSTEMS:   Constitutional: Denies fevers, chills or abnormal weight loss Eyes: Denies blurriness of vision Ears, nose, mouth, throat, and face: Denies mucositis or sore throat Respiratory: Severe shortness of breath and cough with minimal exertion Cardiovascular: Denies palpitation, chest discomfort Gastrointestinal:  Denies nausea, heartburn or change in bowel habits Skin: Denies abnormal skin rashes Lymphatics: Denies new lymphadenopathy or easy bruising Neurological:Denies numbness, tingling or new weaknesses Behavioral/Psych: Mood is stable, no new changes  Extremities: No lower extremity edema All other systems were reviewed with the patient and are negative.  I have reviewed the past medical history, past surgical history, social history and family history with the patient and they are unchanged from previous note.   PHYSICAL EXAMINATION: ECOG PERFORMANCE STATUS: 2 - Symptomatic, <50% confined to bed  Vitals:   04/22/17 1600 04/22/17 1630  BP:    Pulse: 94 87  Resp:    Temp:    SpO2: 100% 99%   Filed Weights   04/20/17 0400 04/21/17 0431 04/22/17 0800  Weight: 205 lb 7.5 oz (93.2 kg) 211 lb 13.8 oz (96.1 kg) 203 lb 11.3 oz (92.4 kg)    GENERAL:alert, no distress and comfortable SKIN: skin color, texture, turgor are normal, no rashes or significant lesions EYES: normal, Conjunctiva are pink and non-injected, sclera clear OROPHARYNX:no exudate, no erythema and lips, buccal mucosa, and tongue normal  NECK: supple, thyroid normal size, non-tender, without nodularity LYMPH:  no palpable lymphadenopathy in the cervical, axillary or inguinal LUNGS: Bilateral crackles and wheezes HEART: regular rate & rhythm and no murmurs and no lower extremity edema ABDOMEN:abdomen soft, non-tender and normal bowel  sounds Musculoskeletal:no cyanosis of digits and no clubbing  NEURO: alert & oriented x 3 with fluent speech, no focal motor/sensory deficits  LABORATORY DATA:  I have reviewed the data as listed CMP Latest Ref Rng & Units 04/22/2017 04/21/2017 04/20/2017  Glucose 65 - 99 mg/dL 98 448(H) 116(H)  BUN 6 - 20 mg/dL 21(H) 22(H) 20  Creatinine 0.44 - 1.00 mg/dL 1.03(H) 0.94 1.09(H)  Sodium 135 - 145 mmol/L 131(L) 125(L) 130(L)  Potassium 3.5 - 5.1 mmol/L 3.8 3.1(L) 3.8  Chloride 101 - 111 mmol/L 89(L) 83(L) 85(L)  CO2 22 - 32 mmol/L 31 27 35(H)  Calcium 8.9 - 10.3 mg/dL 9.7 8.3(L) 10.3  Total Protein 6.5 - 8.1 g/dL - - -  Total Bilirubin 0.3 - 1.2 mg/dL - - -  Alkaline Phos 38 - 126 U/L - - -  AST 15 - 41 U/L - - -  ALT 14 - 54 U/L - - -    Lab Results  Component Value Date   WBC 15.8 (H) 04/22/2017   HGB 9.2 (L) 04/22/2017   HCT 28.6 (L) 04/22/2017   MCV 86.4 04/22/2017   PLT 495 (H) 04/22/2017   NEUTROABS 4.9 04/11/2017    ASSESSMENT AND PLAN: 1. Atrial fibrillation with congestive heart failure: The care of cardiology and hospitalist 2. metastatic breast cancer triple negative disease: I spent a lot of time today talking about poor prognosis and that no treatments can have any major impact on her extent of quality of life. However patient is not keen on pursuing hospice care at this time. She would like to try immunotherapy that we had discussed previously. We are still trying to obtain compassionate use of Pembrolizumab. Our goal is to give her the treatment on  04/25/2017. If she gets discharged from the hospital then we can treat her as an outpatient. If she is still in the hospital I will have to think about how to get it administered as an inpatient setting. I discussed the case with Dr. Tyrell Antonio

## 2017-04-22 NOTE — Progress Notes (Signed)
Pt having soft BPs with MAPs of 62-64. RN notified NP regarding BPs. Pt has no change in mentation. Will continue to monitor.

## 2017-04-22 NOTE — Progress Notes (Signed)
Progress Note  Patient Name: Cindy Bradshaw Date of Encounter: 04/22/2017  Primary Cardiologist: Dr. Radford Pax and Dr. Haroldine Laws for heart failure   Subjective   Pt is comfortable lying almost flat. Minimal CP (mets), minimal SOB, improved.   Inpatient Medications    Scheduled Meds: . benzonatate  100 mg Oral TID  . Chlorhexidine Gluconate Cloth  6 each Topical Q0600  . enoxaparin  1 mg/kg Subcutaneous BID  . fentaNYL  50 mcg Transdermal Q72H  . ferrous sulfate  325 mg Oral TID WC  . furosemide  80 mg Intravenous Q6H  . lactulose  20 g Oral BID  . levalbuterol  0.63 mg Nebulization TID  . metoprolol succinate  25 mg Oral Daily  . pantoprazole  40 mg Oral Q1200  . polyethylene glycol  17 g Oral BID  . potassium chloride  40 mEq Oral BID  . predniSONE  40 mg Oral Q breakfast  . protein supplement shake  11 oz Oral BID BM  . senna  2 tablet Oral Daily  . sodium chloride flush  10-40 mL Intracatheter Q12H   Continuous Infusions: . amiodarone 30 mg/hr (04/21/17 2124)  . norepinephrine (LEVOPHED) Adult infusion    . ondansetron (ZOFRAN) IV 8 mg (04/22/17 0700)   PRN Meds: acetaminophen **OR** acetaminophen, albuterol, ALPRAZolam, alum & mag hydroxide-simeth, bisacodyl, diphenhydrAMINE, guaiFENesin-codeine, guaiFENesin-dextromethorphan, methocarbamol, morphine injection, promethazine, sodium chloride flush   Vital Signs    Vitals:   04/22/17 0300 04/22/17 0400 04/22/17 0500 04/22/17 0600  BP: (!) 90/45 (!) 99/46 (!) 96/37 (!) 95/49  Pulse: (!) 54 86 88 88  Resp: (!) 25 (!) 25 (!) 24 (!) 25  Temp:  98.3 F (36.8 C)    TempSrc:      SpO2: 98% 98% 99% 100%  Weight:      Height:        Intake/Output Summary (Last 24 hours) at 04/22/17 0736 Last data filed at 04/22/17 0700  Gross per 24 hour  Intake           1746.1 ml  Output             1700 ml  Net             46.1 ml   Filed Weights   04/18/17 0504 04/20/17 0400 04/21/17 0431  Weight: 212 lb 4.9 oz (96.3 kg)  205 lb 7.5 oz (93.2 kg) 211 lb 13.8 oz (96.1 kg)    Telemetry    AFIB 100-110's. Intermittent NSR. Was NSR in the 80's from 0230 to 0650.  - Personally Reviewed  ECG    No new tracings- Personally Reviewed  Physical Exam   Physical Exam  Constitutional: She is oriented to person, place, and time. She appears well-developed.  Ill appearing. With thinned hair.   HENT:  Head: Normocephalic and atraumatic.  Neck: Normal range of motion. No JVD present.  Cardiovascular: An irregularly irregular rhythm present. Exam reveals no gallop and no friction rub.   No murmur heard. Pulmonary/Chest: No respiratory distress. She has no wheezes. She has no rales.  Shallow breathing  Abdominal: Soft. She exhibits no distension. There is no tenderness.  Musculoskeletal: Normal range of motion. She exhibits no edema.  Neurological: She is alert and oriented to person, place, and time.  Skin: Skin is warm and dry.  Psychiatric:  Blunted affect  Vitals reviewed.   Labs    Chemistry  Recent Labs Lab 04/19/17 0250 04/20/17 0744 04/21/17 0500  NA 132*  130* 125*  K 3.9 3.8 3.1*  CL 88* 85* 83*  CO2 32 35* 27  GLUCOSE 110* 116* 448*  BUN 22* 20 22*  CREATININE 0.86 1.09* 0.94  CALCIUM 9.8 10.3 8.3*  GFRNONAA >60 59* >60  GFRAA >60 >60 >60  ANIONGAP 12 10 15      Hematology  Recent Labs Lab 04/20/17 0744 04/21/17 0500 04/22/17 0649  WBC 13.5* 12.1* 15.8*  RBC 3.70* 3.14* 3.31*  HGB 10.2* 8.8* 9.2*  HCT 32.1* 27.2* 28.6*  MCV 86.8 86.6 86.4  MCH 27.6 28.0 27.8  MCHC 31.8 32.4 32.2  RDW 18.5* 18.7* 18.7*  PLT 597* 528* 495*    Cardiac EnzymesNo results for input(s): TROPONINI in the last 168 hours. No results for input(s): TROPIPOC in the last 168 hours.   BNPNo results for input(s): BNP, PROBNP in the last 168 hours.   DDimer No results for input(s): DDIMER in the last 168 hours.   Radiology    Dg Abd 1 View  Result Date: 04/20/2017 CLINICAL DATA:  Vomiting for 3  days.  Chemotherapy for breast cancer EXAM: ABDOMEN - 1 VIEW COMPARISON:  Staging CT 01/30/2017 FINDINGS: Diffuse formed stool, confluent throughout the left colon. No obstruction or rectal impaction. No concerning mass effect or calcification. Cholecystectomy clips. IMPRESSION: Moderate to large stool volume without obstruction or rectal impaction. Electronically Signed   By: Monte Fantasia M.D.   On: 04/20/2017 08:32    Cardiac Studies   Echo 02/12/17: EF 25-30 from 60% Cath 01/24/16:  no CAD - atypical CP  Patient Profile     46 y.o. female with a hx of Metastatic breast cancer with extensive pulmonary metastases, history of recentPE on full dose Lovenox, NICMwith EF 25%, PAF, asthmawith atrial fibrillation  Assessment & Plan    Breast cancer  - Stage 4, per onc.   - Palliative care team involved. Continue with aggressive care.   - Pain has been an issue but improved with Q2 injections  AFIB  - she is showing brief conversions to NSR personally viewed on tele. She was in NSR in the 80's for over 2.5 hours until 0650 this am. Because of her transient autoconversions, it does not make sense to DCCV.   - Will convert amiodarone to PO 400 BID for one week then 200 BID for one week after that then 200 QD thereafter.   - Continue with low dose metoprolol  - Her ability to maintain NSR for prolonged periods of time will be challenging. Try our best with palliative measures, AMIO.   - Lovenox for anticoagulation.   Acute on chronic combined systolic and diastolic HF  - EF 81-01% by echo in 02/2017, down from 60%. Etiology possibly related to Adriamycin vs tachy induced with afib.   - appears more comfortable today  - Wt 203,  211 yesterday from 205 from 212 from 219.  - Net out 5.7 liters. 1.7 L urine output yesterday. Seems to be feeling better after diuresis.   - Creat stable, 1.03 today.   - BP stable but soft  - Change Lasix to oral dosing. 120 mg bid  - hypokalemia being repleted,  K+ 3.8 today from 3.1 yesterday  Signed, Daune Perch, NP  04/22/2017, 7:36 AM

## 2017-04-22 NOTE — Progress Notes (Signed)
Daily Progress Note   Patient Name: Cindy Bradshaw       Date: 04/22/2017 DOB: 08/09/1971  Age: 46 y.o. MRN#: [id number] Attending Physician: Elmarie Shiley, MD Primary Care Physician: [name], MD Admit Date: 04/11/2017  Reason for Consultation/Follow-up: Establishing goals of care and Pain control  Subjective: Cindy Bradshaw reports pain continues to be adequately controlled.  Current pain is 3/10.  She then became tearful and said that she is frustrated by the doctors continuing to try to bring up her goals of care moving forward.  "I know what I am up against, and I am tired of talking about it over and over."    See below:  Length of Stay: 11  Current Medications: Scheduled Meds:  . amiodarone  400 mg Oral BID  . benzonatate  100 mg Oral TID  . Chlorhexidine Gluconate Cloth  6 each Topical Q0600  . enoxaparin  1 mg/kg Subcutaneous BID  . fentaNYL  50 mcg Transdermal Q72H  . ferrous sulfate  325 mg Oral TID WC  . furosemide  120 mg Oral BID  . lactulose  20 g Oral BID  . levalbuterol  0.63 mg Nebulization BID  . metoprolol succinate  25 mg Oral Daily  . pantoprazole  40 mg Oral Q1200  . polyethylene glycol  17 g Oral BID  . potassium chloride  40 mEq Oral BID  . predniSONE  20 mg Oral Q breakfast  . protein supplement shake  11 oz Oral BID BM  . senna  2 tablet Oral Daily  . sodium chloride flush  10-40 mL Intracatheter Q12H    Continuous Infusions: . norepinephrine (LEVOPHED) Adult infusion    . ondansetron (ZOFRAN) IV Stopped (04/22/17 1140)    PRN Meds: acetaminophen **OR** acetaminophen, albuterol, ALPRAZolam, alum & mag hydroxide-simeth, bisacodyl, diphenhydrAMINE, guaiFENesin-codeine, guaiFENesin-dextromethorphan, methocarbamol, morphine, morphine  injection, promethazine, sodium chloride flush  Physical Exam    General: Alert, awake, in no acute distress. Tearful HEENT: No bruits, no goiter, no JVD Heart: Regular rate and rhythm. No murmur appreciated. Lungs: Good air movement, clear Abdomen: Soft, nontender, nondistended, positive bowel sounds.  Ext: No significant edema Skin: Warm and dry Neuro: Grossly intact, nonfocal.        Vital Signs: BP (!) 81/61   Pulse 74   Temp 98.7 F (  37.1 C)   Resp (!) 21   Ht 5\' 7"  (1.702 m)   Wt 92.4 kg (203 lb 11.3 oz)   SpO2 100%   BMI 31.90 kg/m  SpO2: SpO2: 100 % O2 Device: O2 Device: Not Delivered O2 Flow Rate: O2 Flow Rate (L/min): 21 L/min  Intake/output summary:   Intake/Output Summary (Last 24 hours) at 04/22/17 1532 Last data filed at 04/22/17 1041  Gross per 24 hour  Intake           1188.9 ml  Output              700 ml  Net            488.9 ml   LBM: Last BM Date: 04/22/17 Baseline Weight: Weight: 94.3 kg (207 lb 14.3 oz) Most recent weight: Weight: 92.4 kg (203 lb 11.3 oz)       Palliative Assessment/Data:      Patient Active Problem List   Diagnosis Date Noted  . Acute on chronic combined systolic and diastolic heart failure ([place])   . Asthma, chronic, unspecified asthma severity, with acute exacerbation 04/11/2017  . Acute respiratory failure ([place]) 04/11/2017  . Hypoxia 04/04/2017  . Dyspnea 02/23/2017  . Acute pulmonary embolism ([place]) 02/23/2017  . Cardiomyopathy ([place])   . A-fib ([place]) 02/13/2017  . Metastatic breast cancer ([place])   . DCM (dilated cardiomyopathy) ([place])   . Atrial fibrillation with RVR ([place]) 02/11/2017  . Chest pain   . Adnexal cyst: Right per CT 12/09/2015 12/12/2015  . Chemotherapy-induced peripheral neuropathy ([place]) 12/05/2015  . Normocytic normochromic anemia 09/19/2015  . Breast cancer of upper-outer quadrant of left female breast ([place]) 09/07/2015    Palliative Care Assessment & Plan   Patient Profile: 46 y.o. female  with  past medical history of metastatic breast cancer with extensive pulmonary mets, h/o PE on lovenox, NICM with EF 25%, a-fib, asthma admitted on 04/11/2017 with acute hypoxic respiratory failure.  Cardiology has evaluated and currently on amio and levophed for a-fib and pressure support while diuresing.   She has widespread metastasis and with poor prognosis per oncology.  Plan is for trial of compassionate use of [place].  Pain remains poorly controlled and palliative consulted for pain management and goals of care.      Recommendations/Plan: Pain:  Made addition of oral morphine as rescue medication.  Added MSIR 7.5-15mg  every 3 hours as needed.  I have also left a backup IV medication (Morphine 2mg ) to be used as a second line pain medication 30-40 minutes after her oral medication if the oral medication is ineffective to relieve her pain.  Goals of Care and Additional Recommendations: Limitations on Scope of Treatment: Full Scope Treatment- Reports she is tired of doctors continuing to discuss- "I know what I am up against, and I am tired of talking about it over and over."    Code Status:    Code Status Orders        Start     Ordered   04/11/17 1831  Full code  Continuous     04/11/17 1831    Code Status History    Date Active Date Inactive Code Status Order ID Comments User Context   02/23/2017  8:44 AM 02/26/2017  6:00 PM Full Code [id number]  Radene Gunning, NP ED   02/11/2017  4:13 PM 02/14/2017  6:33 PM Full Code [id number]  Robbie Lis, MD Inpatient   02/21/2016  4:25 PM 02/22/2016 11:29 AM Full Code  [id number]  Irene Limbo, MD Inpatient   01/21/2016  6:36 PM 01/25/2016  3:06 PM Full Code [id number]  Debbe Odea, MD ED   12/09/2015  1:58 AM 12/12/2015  4:32 PM Full Code [id number]  Ivor Costa, MD ED       Prognosis:  Guarded  Discharge Planning:  To Be Determined  Care plan was discussed with patient, RN   Thank you for allowing the Palliative Medicine Team to assist in  the care of this patient.   Total Time 20 Prolonged Time Billed No      Greater than 50%  of this time was spent counseling and coordinating care related to the above assessment and plan.  Micheline Rough, MD  Please contact Palliative Medicine Team phone at [phone] for questions and concerns.

## 2017-04-23 ENCOUNTER — Inpatient Hospital Stay (HOSPITAL_COMMUNITY): Admission: RE | Admit: 2017-04-23 | Payer: Federal, State, Local not specified - PPO | Source: Ambulatory Visit

## 2017-04-23 LAB — CBC
HCT: 27.6 % — ABNORMAL LOW (ref 36.0–46.0)
Hemoglobin: 8.7 g/dL — ABNORMAL LOW (ref 12.0–15.0)
MCH: 27.3 pg (ref 26.0–34.0)
MCHC: 31.5 g/dL (ref 30.0–36.0)
MCV: 86.5 fL (ref 78.0–100.0)
PLATELETS: 427 10*3/uL — AB (ref 150–400)
RBC: 3.19 MIL/uL — ABNORMAL LOW (ref 3.87–5.11)
RDW: 18.5 % — AB (ref 11.5–15.5)
WBC: 14.4 10*3/uL — AB (ref 4.0–10.5)

## 2017-04-23 LAB — BASIC METABOLIC PANEL
ANION GAP: 8 (ref 5–15)
BUN: 25 mg/dL — ABNORMAL HIGH (ref 6–20)
CALCIUM: 9.6 mg/dL (ref 8.9–10.3)
CO2: 29 mmol/L (ref 22–32)
Chloride: 97 mmol/L — ABNORMAL LOW (ref 101–111)
Creatinine, Ser: 1.09 mg/dL — ABNORMAL HIGH (ref 0.44–1.00)
GFR, EST NON AFRICAN AMERICAN: 59 mL/min — AB (ref >60)
Glucose, Bld: 98 mg/dL (ref 65–99)
Potassium: 3.7 mmol/L (ref 3.5–5.1)
SODIUM: 134 mmol/L — AB (ref 135–145)

## 2017-04-23 LAB — GLUCOSE, CAPILLARY: GLUCOSE-CAPILLARY: 92 mg/dL (ref 65–99)

## 2017-04-23 MED ORDER — SODIUM CHLORIDE 0.9 % IV BOLUS (SEPSIS)
500.0000 mL | Freq: Once | INTRAVENOUS | Status: AC
  Administered 2017-04-23: 500 mL via INTRAVENOUS

## 2017-04-23 MED ORDER — ADULT MULTIVITAMIN W/MINERALS CH
1.0000 | ORAL_TABLET | Freq: Every day | ORAL | Status: DC
  Administered 2017-04-24 – 2017-04-27 (×4): 1 via ORAL
  Filled 2017-04-23 (×4): qty 1

## 2017-04-23 MED ORDER — MORPHINE SULFATE (PF) 2 MG/ML IV SOLN
2.0000 mg | INTRAVENOUS | Status: DC | PRN
  Administered 2017-04-23: 2 mg via INTRAVENOUS
  Filled 2017-04-23: qty 1

## 2017-04-23 MED ORDER — MORPHINE SULFATE (PF) 2 MG/ML IV SOLN
2.0000 mg | INTRAVENOUS | Status: DC | PRN
  Administered 2017-04-23 – 2017-04-25 (×13): 2 mg via INTRAVENOUS
  Filled 2017-04-23 (×13): qty 1

## 2017-04-23 NOTE — Progress Notes (Signed)
Progress Note  Patient Name: Cindy Bradshaw Date of Encounter: 04/23/2017  Primary Cardiologist: Dr. Radford Pax and Dr. Haroldine Laws for heart failure   Subjective   Pt is feeling a little better. Would like to try getting out of bed to chair today.  Minimal CP (mets), minimal SOB, improved.   Inpatient Medications    Scheduled Meds: . amiodarone  400 mg Oral BID  . benzonatate  100 mg Oral TID  . Chlorhexidine Gluconate Cloth  6 each Topical Q0600  . enoxaparin  1 mg/kg Subcutaneous BID  . fentaNYL  50 mcg Transdermal Q72H  . ferrous sulfate  325 mg Oral TID WC  . furosemide  120 mg Oral BID  . lactulose  20 g Oral BID  . levalbuterol  0.63 mg Nebulization BID  . metoprolol succinate  25 mg Oral Daily  . pantoprazole  40 mg Oral Q1200  . polyethylene glycol  17 g Oral BID  . potassium chloride  40 mEq Oral BID  . predniSONE  20 mg Oral Q breakfast  . protein supplement shake  11 oz Oral BID BM  . senna  2 tablet Oral Daily  . sodium chloride flush  10-40 mL Intracatheter Q12H   Continuous Infusions: . norepinephrine (LEVOPHED) Adult infusion    . ondansetron [facility]) IV Stopped (04/23/17 0515)   PRN Meds: acetaminophen **OR** acetaminophen, albuterol, ALPRAZolam, alum & mag hydroxide-simeth, bisacodyl, diphenhydrAMINE, guaiFENesin-codeine, guaiFENesin-dextromethorphan, methocarbamol, morphine, morphine injection, promethazine, sodium chloride flush   Vital Signs    Vitals:   04/23/17 0500 04/23/17 0515 04/23/17 0600 04/23/17 0700  BP: (!) 90/43 (!) 93/44 109/64 (!) 93/55  Pulse: 84  86 95  Resp: (!) 25  (!) 24 (!) 23  Temp:      TempSrc:      SpO2: 100%  99% 98%  Weight:      Height:        Intake/Output Summary (Last 24 hours) at 04/23/17 0741 Last data filed at 04/23/17 0515  Gross per 24 hour  Intake           1152.4 ml  Output              600 ml  Net            552.4 ml   Filed Weights   04/21/17 0431 04/22/17 0800 04/23/17 0100  Weight: 211 lb 13.8  oz (96.1 kg) 203 lb 11.3 oz (92.4 kg) 201 lb 8 oz (91.4 kg)    Telemetry    Intermittent NSR. Maintained SR in the 90's from 1345 yesterday until 0615 today. Now in AFIB around 100. - Personally Reviewed  ECG    No new tracings- Personally Reviewed  Physical Exam   Physical Exam  Constitutional: She is oriented to person, place, and time. She appears well-developed.  Ill appearing. With thinned hair.   HENT:  Head: Normocephalic and atraumatic.  Neck: Normal range of motion. No JVD present.  Cardiovascular: An irregularly irregular rhythm present. Exam reveals no gallop and no friction rub.   No murmur heard. Pulmonary/Chest: No respiratory distress. She has no wheezes. She has no rales. She exhibits tenderness.  Shallow breathing. Chest wall tenderness to palpation  Abdominal: Soft. She exhibits no distension. There is no tenderness.  Musculoskeletal: Normal range of motion. She exhibits no edema.  Neurological: She is alert and oriented to person, place, and time.  Skin: Skin is warm and dry.  Psychiatric:  Blunted affect  Vitals reviewed.  Labs    Chemistry  Recent Labs Lab 04/20/17 0744 04/21/17 0500 04/22/17 0649  NA 130* 125* 131*  K 3.8 3.1* 3.8  CL 85* 83* 89*  CO2 35* 27 31  GLUCOSE 116* 448* 98  BUN 20 22* 21*  CREATININE 1.09* 0.94 1.03*  CALCIUM 10.3 8.3* 9.7  GFRNONAA 59* >60 >60  GFRAA >60 >60 >60  ANIONGAP 10 15 11      Hematology  Recent Labs Lab 04/20/17 0744 04/21/17 0500 04/22/17 0649  WBC 13.5* 12.1* 15.8*  RBC 3.70* 3.14* 3.31*  HGB 10.2* 8.8* 9.2*  HCT 32.1* 27.2* 28.6*  MCV 86.8 86.6 86.4  MCH 27.6 28.0 27.8  MCHC 31.8 32.4 32.2  RDW 18.5* 18.7* 18.7*  PLT 597* 528* 495*    Cardiac EnzymesNo results for input(s): TROPONINI in the last 168 hours. No results for input(s): TROPIPOC in the last 168 hours.   BNPNo results for input(s): BNP, PROBNP in the last 168 hours.   DDimer No results for input(s): DDIMER in the last  168 hours.   Radiology    Dg Chest Port 1 View  Result Date: 04/22/2017 CLINICAL DATA:  Shortness of breath.  History of leukocytosis. EXAM: PORTABLE CHEST 1 VIEW COMPARISON:  04/17/2017.  CT 04/12/2017 . FINDINGS: PowerPort catheter noted with lead tip projected over right atrium. Stable cardiomegaly stable densities are noted in the lungs consistent with known metastatic disease. Basilar atelectasis. Small right pleural effusion. No pneumothorax. Surgical clips left chest. No acute bony abnormality. IMPRESSION: 1. PowerPort catheter stable position. 2. Stable pulmonary densities noted bilaterally consistent known metastatic disease. Basilar atelectasis. Small right pleural effusion. No significant changes noted from prior exams . Electronically Signed   By: Marcello Moores  Register   On: 04/22/2017 08:53    Cardiac Studies   Echo 02/12/17: EF 25-30 from 60% Cath 01/24/16:  no CAD - atypical CP  Patient Profile     46 y.o. female with a hx of Metastatic breast cancer with extensive pulmonary metastases, history of recentPE on full dose Lovenox, NICMwith EF 25%, PAF, asthmawith atrial fibrillation  Assessment & Plan    Acute on chronic combined systolic and diastolic HF  - EF 22-63% by echo in 02/2017, down from 60%. Etiology possibly related to Adriamycin vs tachy induced with afib.   - appears more comfortable   - Wt 201.  Wt is down 2 pounds overnight, 10 pounds in last 2 days. Down 18 pounds from max wt of 219.  - Seems to be feeling better after diuresis.   - Creat stable, 1.09 today.  - BP stable but soft. Levophed was ordered for BP support during aggressive diuresis, but this was not initiated as BP was stable.   - Lasix Changed to oral dosing. 120 mg bid. Only had 600 ml urine output in last 24h with being off IV lasix. Will follow.   - hypokalemia being repleted, K+ 3.7   AFIB  - she is showing brief conversions to NSR personally viewed on tele. She was in NSR in the 90's from 1345  yesterday to 0615 this am. Now back in afib around 100 bpm. Because of her transient autoconversions, it does not make sense to DCCV.   - Amiodarone converted to PO 400 BID for one week then 200 BID for one week after that then 200 QD thereafter.   - Continue with low dose metoprolol  - Her ability to maintain NSR for prolonged periods of time will be challenging. Try our  best with palliative measures, AMIO.  -CHA2DS2/VAS Stroke Risk Score is 2 (CHF, female). Lovenox for anticoagulation (was previously on Xarelto but developed PE, so was using Lovenox at home)  Breast cancer  - Stage 4, per onc.   - Palliative care team involved. Continue with aggressive care.   - Pain has been an issue but improved with Q2 injections  Signed, Daune Perch, NP  04/23/2017, 7:41 AM

## 2017-04-23 NOTE — Progress Notes (Signed)
RN spoke to NP regarding pt BP and PRN pain medication administration. NP okay with MAPs 55-60 as long as pt has no change in mentation and is urinating adequately. RN to provide pt with pain medication as needed, per order, and notify NP if BP continues to remain soft. Will continue to monitor.

## 2017-04-23 NOTE — Progress Notes (Signed)
Pt having soft BP following administration of IV pain medication. RN notified NP of BP 92/44 with MAPs ranging from 59-60. Will continue to monitor.

## 2017-04-23 NOTE — Progress Notes (Signed)
Daily Progress Note   Patient Name: Cindy Bradshaw. [place]       Date: 04/23/2017 DOB: 06/04/1971  Age: 46 y.o. MRN#: [id number] Attending Physician: Elmarie Shiley, MD Primary Care Physician: [name], MD Admit Date: 04/11/2017  Reason for Consultation/Follow-up: Establishing goals of care and Pain control  Subjective: Cindy Bradshaw reports "horrible. Absolutely Horrible" night last night.  Reports that PO pain medication did not relieve her pain and she was never able to get back under control, even with rescue IV meds.  See below:  Length of Stay: 12  Current Medications: Scheduled Meds:  . amiodarone  400 mg Oral BID  . benzonatate  100 mg Oral TID  . Chlorhexidine Gluconate Cloth  6 each Topical Q0600  . enoxaparin  1 mg/kg Subcutaneous BID  . fentaNYL  50 mcg Transdermal Q72H  . ferrous sulfate  325 mg Oral TID WC  . furosemide  120 mg Oral BID  . lactulose  20 g Oral BID  . levalbuterol  0.63 mg Nebulization BID  . metoprolol succinate  25 mg Oral Daily  . pantoprazole  40 mg Oral Q1200  . polyethylene glycol  17 g Oral BID  . potassium chloride  40 mEq Oral BID  . predniSONE  20 mg Oral Q breakfast  . protein supplement shake  11 oz Oral BID BM  . senna  2 tablet Oral Daily  . sodium chloride flush  10-40 mL Intracatheter Q12H    Continuous Infusions: . norepinephrine (LEVOPHED) Adult infusion    . ondansetron [facility]) IV Stopped (04/23/17 0515)    PRN Meds: acetaminophen **OR** acetaminophen, albuterol, ALPRAZolam, alum & mag hydroxide-simeth, bisacodyl, diphenhydrAMINE, guaiFENesin-codeine, guaiFENesin-dextromethorphan, methocarbamol, morphine injection, promethazine, sodium chloride flush  Physical Exam    General: Alert, awake, sleepy and appears more SOB  today. Tearful HEENT: No bruits, no goiter, no JVD Heart: Regular rate and rhythm. No murmur appreciated. Lungs: Diminished air movement, clear Abdomen: Soft, nontender, nondistended, positive bowel sounds.  Ext: No significant edema Skin: Warm and dry Neuro: Grossly intact, nonfocal.        Vital Signs: BP (!) 96/49   Pulse (!) 104   Temp 98.4 F (36.9 C) (Oral)   Resp (!) 25   Ht 5\' 7"  (1.702 m)   Wt 91.4 kg (201 lb 8 oz)  SpO2 98%   BMI 31.56 kg/m  SpO2: SpO2: 98 % O2 Device: O2 Device: Nasal Cannula O2 Flow Rate: O2 Flow Rate (L/min): 2 L/min  Intake/output summary:   Intake/Output Summary (Last 24 hours) at 04/23/17 0949 Last data filed at 04/23/17 0900  Gross per 24 hour  Intake             1149 ml  Output             1500 ml  Net             -351 ml   LBM: Last BM Date: 04/23/17 Baseline Weight: Weight: 94.3 kg (207 lb 14.3 oz) Most recent weight: Weight: 91.4 kg (201 lb 8 oz)       Palliative Assessment/Data:      Patient Active Problem List   Diagnosis Date Noted  . Acute on chronic combined systolic and diastolic heart failure ([place])   . Asthma, chronic, unspecified asthma severity, with acute exacerbation 04/11/2017  . Acute respiratory failure ([place]) 04/11/2017  . Hypoxia 04/04/2017  . Dyspnea 02/23/2017  . Acute pulmonary embolism ([place]) 02/23/2017  . Cardiomyopathy ([place])   . A-fib ([place]) 02/13/2017  . Metastatic breast cancer ([place])   . DCM (dilated cardiomyopathy) ([place])   . Atrial fibrillation with RVR ([place]) 02/11/2017  . Chest pain   . Adnexal cyst: Right per CT 12/09/2015 12/12/2015  . Chemotherapy-induced peripheral neuropathy ([place]) 12/05/2015  . Normocytic normochromic anemia 09/19/2015  . Breast cancer of upper-outer quadrant of left female breast ([name]) 09/07/2015    Palliative Care Assessment & Plan   Patient Profile: 46 y.o. female  with past medical history of metastatic breast cancer with extensive pulmonary mets, h/o PE on  lovenox, NICM with EF 25%, a-fib, asthma admitted on 04/11/2017 with acute hypoxic respiratory failure.  Cardiology has evaluated and currently on amio and levophed for a-fib and pressure support while diuresing.   She has widespread metastasis and with poor prognosis per oncology.  Plan is for trial of compassionate use of [place].  Pain remains poorly controlled and palliative consulted for pain management and goals of care.      Recommendations/Plan: Pain: Reports poor pain control with oral medication.  Over the 24 hours prior to transitioning to orals, she had used morphine 2mg  IV on 6 occasions with good pain control overall.  This 12mg  of IV morphine is equivalent to a total of only 36mg  of oral morphine over the entire 24 hour period, and so I started with morphine 7.5-15mg  every 3 hours as needed.  She received only lower dose of 7.5mg  on two occasions followed by rescue 2mg  IV dose 40 minutes later.  The 7.5mg  dose is still higher opioid equivalent that the 2mg  IV (equivalent to 6mg  PO morphine) that seems to control her pain well, but it was ineffective to help with her breakthrough pain.  I would therefore plan to increase the dose of her oral rescue medication to a minimum of 15mg , but she is too upset to consider trial of orals again today.  Her outpatient rescue dose has been 30mg  of oral morphine, but with her soft pressures, I have been wanting to see how she tolerates lower doses of oral rescue meds as she has been having good control with lower opioid equivalents of her IV meds.  As she has been having continued uncontrolled pain, plan to continue morphine IV 2mg  every 2 hours as needed for now and will re-attempt orals once pain is  back under better control.    Goals of Care and Additional Recommendations: Limitations on Scope of Treatment: Full Scope Treatment- Reports she is tired of doctors continuing to discuss- "I know what I am up against, and I am tired of talking about it over and  over."    Code Status:    Code Status Orders        Start     Ordered   04/11/17 1831  Full code  Continuous     04/11/17 1831    Code Status History    Date Active Date Inactive Code Status Order ID Comments User Context   02/23/2017  8:44 AM 02/26/2017  6:00 PM Full Code [id number]  Radene Gunning, NP ED   02/11/2017  4:13 PM 02/14/2017  6:33 PM Full Code [id number]  Robbie Lis, MD Inpatient   02/21/2016  4:25 PM 02/22/2016 11:29 AM Full Code [id number]  Irene Limbo, MD Inpatient   01/21/2016  6:36 PM 01/25/2016  3:06 PM Full Code [id number]  Debbe Odea, MD ED   12/09/2015  1:58 AM 12/12/2015  4:32 PM Full Code [id number]  Ivor Costa, MD ED       Prognosis:  Guarded  Discharge Planning:  To Be Determined  Care plan was discussed with patient, RN   Thank you for allowing the Palliative Medicine Team to assist in the care of this patient.   Total Time 25 Prolonged Time Billed No      Greater than 50%  of this time was spent counseling and coordinating care related to the above assessment and plan.  Micheline Rough, MD  Please contact Palliative Medicine Team phone at [phone] for questions and concerns.

## 2017-04-23 NOTE — Progress Notes (Signed)
PROGRESS NOTE    [name]  MCN:[id number] DOB: [birth year] DOA: 04/11/2017 PCP: [name], MD  Brief Narrative: Cindy Bradshaw. Cindy Bradshaw is a 46 y.o. female with medical history significant of metastatic breast cancer with extensive pulmonary metastases, H/o PE on full dose lovenox, nonischemic cardiomyopathy ejection fraction of 25%, paroxysmal atrial fibrillation, asthma, presented to the ED with worsening shortness of breath, pt reports that this is gradually progressive over 1-2weeks, she was just started on O2 at 2L at her last visit with [name] and given reduced dose [place](chemo).She also reported low grade fevers with cough for last 1-2days, temperature upto 100.9 8/9 at home and some wheezing. CT chest with worsening pulm mets and small effusions  Patient admitted with acute hypoxic respiratory failure, thought to be multifactorial, asthma flare, PNA, pulmonary mets and Heart failure exacerbation. She finished course of antibiotics, and is on prednisone taper. She was treated with IV lasix and amiodarone for HF. She continue to have dyspnea and A fib RVR and cardiogenic shock. Cardiology consulted. She was started transient on IV pressors, and IV amiodarone and Higher doses of IV lasix. Her respiratory status improved with 6 L diuresis. She continue to be A fib. She was consider for cardioversion, but due to intermittent sinus rhythm, cardioversion was cancelled.  Patient was transition from IV amiodarone and IV lasix to oral on 8-20. She is complaining of uncontrolled pain today 8-21. Appreciate [name] help with pain management.     Assessment & Plan:   Acute hypoxic respiratory failure -Multifactorial, secondary to progression of her pulmonary metastatic disease, CHF also contributing -Prednisone taper. Dose change to 20 mg starting 8-21 -CTA chest notes progression of Pulm mets and small effusion.  -Appreciate [name]'s input, prognosis is poor, [name] plans to try  Immunotherapy. Hopefully to start tx 8-23, inpatient if patient is still in the hospital if no , then outpatient.  -Appreciate cardiology assistance. . Negative 6  L.  Continue with tessalon pear. Continue with  robitussin with codein.  Monitor WBC, will taper prednisone. Patient has been afebrile. Chest x ray stable.  She was having worsening dyspnea this morning when I saw her (8-21), primary because of uncontrolled pain.  Continue with oral lasix, minimal out put today   Acute on chronic systolic CHF/Nonischemic cardiomyopathy -EF 25% -Felt to be related to Adriamycin/chemotherapy, vs tachycardia induce.  -appreciate cardiology help.  -negative 6 l.  -change to oral lasix 8-20  Metastatic breast cancer with extensive pulmonary metastases -On palliative chemotherapy by [name] -Palliative care consult for goals of care and symptoms management.  -patient wants to be full code and continue with aggressive care.   H/o PE -on full dose lovenox -stable  P.Afib -with intermittent RVR, pulm disease from mets contributing -Already on full dose anticoagulation for history of PE -Was on IV amiodarone, BP improved.  -Patient converted couple of time to sinus rhythm. No cardioversion per cardiology.  -continue with oral amiodarone.    Chronic pain -Related to metastatic cancer  -Appreciate [name] help.  -continue with morphine PRN IV   Nausea, vomiting; after coughing  She had BM yesterday. Abdomen mild tenderness. No rigidity.  IV Zofran, will add phenergan./  KUB with moderate amount of stool.  Continue with senokot , miralax.  Had BM. No abdominal pain.   Hyponatremia; and pseudohyponatremia,. Sodium corrected by glucose at 133.  Stable.   Leukocytosis; increasing. She completed course of antibiotics.  chest x ray stable. . Will taper prednisone.  DVT prophylaxis: on full dose Lovenox Code Status:  full code Family Communication: No family at bedside Disposition  Plan: remain in the step down unit   Consultants:   Onc [name]   Antimicrobials:   Rocephin/Zithromax 8/9-8/11  Cefdinir 8/11   Subjective: She is not feeling well this morning. She is having a lot of pain. Oral pain medications didn't help yesterday. Report pain all over.  Complaining of worsening dyspnea due to pain.   Objective: Vitals:   04/23/17 1124 04/23/17 1200 04/23/17 1300 04/23/17 1400  BP:  (!) 96/50 95/66 96/64   Pulse:  (!) 107 73 99  Resp:  (!) 26 (!) 24 (!) 23  Temp:  98.2 F (36.8 C)    TempSrc:  Oral    SpO2: 97% 94% 97% 98%  Weight:      Height:        Intake/Output Summary (Last 24 hours) at 04/23/17 1457 Last data filed at 04/23/17 1400  Gross per 24 hour  Intake             1610 ml  Output             2100 ml  Net             -490 ml   Filed Weights   04/21/17 0431 04/22/17 0800 04/23/17 0100  Weight: 96.1 kg (211 lb 13.8 oz) 92.4 kg (203 lb 11.3 oz) 91.4 kg (201 lb 8 oz)    Examination:  Gen: NAD, chronic ill appearing.  Lungs: Normal respiratory effort, no wheezing, bilateral fine crackles.  CVS: S 1, S 2 IRR Abd;  Soft, NT, ND Extremities: No cyanosis,  Skin; No rash.    Data Reviewed:   CBC:  Recent Labs Lab 04/19/17 0250 04/20/17 0744 04/21/17 0500 04/22/17 0649 04/23/17 0731  WBC 12.6* 13.5* 12.1* 15.8* 14.4*  HGB 10.8* 10.2* 8.8* 9.2* 8.7*  HCT 33.4* 32.1* 27.2* 28.6* 27.6*  MCV 86.3 86.8 86.6 86.4 86.5  PLT 550* 597* 528* 495* 030*   Basic Metabolic Panel:  Recent Labs Lab 04/19/17 0250 04/20/17 0744 04/21/17 0500 04/22/17 0649 04/23/17 0731  NA 132* 130* 125* 131* 134*  K 3.9 3.8 3.1* 3.8 3.7  CL 88* 85* 83* 89* 97*  CO2 32 35* 27 31 29   GLUCOSE 110* 116* 448* 98 98  BUN 22* 20 22* 21* 25*  CREATININE 0.86 1.09* 0.94 1.03* 1.09*  CALCIUM 9.8 10.3 8.3* 9.7 9.6   GFR: Estimated Creatinine Clearance: 74 mL/min (A) (by C-G formula based on SCr of 1.09 mg/dL (H)). Liver Function Tests: No results  for input(s): AST, ALT, ALKPHOS, BILITOT, PROT, ALBUMIN in the last 168 hours. No results for input(s): LIPASE, AMYLASE in the last 168 hours. No results for input(s): AMMONIA in the last 168 hours. Coagulation Profile: No results for input(s): INR, PROTIME in the last 168 hours. Cardiac Enzymes: No results for input(s): CKTOTAL, CKMB, CKMBINDEX, TROPONINI in the last 168 hours. BNP (last 3 results) No results for input(s): PROBNP in the last 8760 hours. HbA1C: No results for input(s): HGBA1C in the last 72 hours. CBG:  Recent Labs Lab 04/21/17 2124 04/22/17 0747 04/22/17 1257 04/22/17 2314 04/23/17 0741  GLUCAP 105* 94 122* 106* 92   Lipid Profile: No results for input(s): CHOL, HDL, LDLCALC, TRIG, CHOLHDL, LDLDIRECT in the last 72 hours. Thyroid Function Tests: No results for input(s): TSH, T4TOTAL, FREET4, T3FREE, THYROIDAB in the last 72 hours. Anemia Panel: No results for input(s): VITAMINB12, FOLATE, FERRITIN,  TIBC, IRON, RETICCTPCT in the last 72 hours. Urine analysis:    Component Value Date/Time   COLORURINE YELLOW 04/11/2017 [place] 04/11/2017 1716   LABSPEC 1.006 04/11/2017 1716   LABSPEC 1.010 04/10/2016 1224   PHURINE 5.0 04/11/2017 1716   GLUCOSEU NEGATIVE 04/11/2017 1716   GLUCOSEU Negative 04/10/2016 1224   HGBUR SMALL (A) 04/11/2017 1716   BILIRUBINUR NEGATIVE 04/11/2017 1716   BILIRUBINUR Negative 04/10/2016 1224   KETONESUR NEGATIVE 04/11/2017 1716   PROTEINUR NEGATIVE 04/11/2017 1716   UROBILINOGEN 0.2 04/10/2016 1224   NITRITE NEGATIVE 04/11/2017 1716   LEUKOCYTESUR NEGATIVE 04/11/2017 1716   LEUKOCYTESUR Negative 04/10/2016 1224   Sepsis Labs: @LABRCNTIP (procalcitonin:4,lacticidven:4)  ) No results found for this or any previous visit (from the past 240 hour(s)).       Radiology Studies: Dg Chest Port 1 View  Result Date: 04/22/2017 CLINICAL DATA:  Shortness of breath.  History of leukocytosis. EXAM: PORTABLE CHEST 1  VIEW COMPARISON:  04/17/2017.  CT 04/12/2017 . FINDINGS: PowerPort catheter noted with lead tip projected over right atrium. Stable cardiomegaly stable densities are noted in the lungs consistent with known metastatic disease. Basilar atelectasis. Small right pleural effusion. No pneumothorax. Surgical clips left chest. No acute bony abnormality. IMPRESSION: 1. PowerPort catheter stable position. 2. Stable pulmonary densities noted bilaterally consistent known metastatic disease. Basilar atelectasis. Small right pleural effusion. No significant changes noted from prior exams . Electronically Signed   By: Marcello Moores  Register   On: 04/22/2017 08:53        Scheduled Meds: . amiodarone  400 mg Oral BID  . benzonatate  100 mg Oral TID  . Chlorhexidine Gluconate Cloth  6 each Topical Q0600  . enoxaparin  1 mg/kg Subcutaneous BID  . fentaNYL  50 mcg Transdermal Q72H  . ferrous sulfate  325 mg Oral TID WC  . furosemide  120 mg Oral BID  . lactulose  20 g Oral BID  . levalbuterol  0.63 mg Nebulization BID  . metoprolol succinate  25 mg Oral Daily  . multivitamin with minerals  1 tablet Oral Daily  . pantoprazole  40 mg Oral Q1200  . polyethylene glycol  17 g Oral BID  . potassium chloride  40 mEq Oral BID  . predniSONE  20 mg Oral Q breakfast  . protein supplement shake  11 oz Oral BID BM  . senna  2 tablet Oral Daily  . sodium chloride flush  10-40 mL Intracatheter Q12H   Continuous Infusions: . norepinephrine (LEVOPHED) Adult infusion    . ondansetron (ZOFRAN) IV Stopped (04/23/17 1204)     LOS: 12 days    Time spent: 35 minutes.     Niel Hummer, Md [facility] Pager [phone]  If 7PM-7AM, please contact night-coverage [email or web address] Password [email or web address] 04/23/2017, 2:57 PM

## 2017-04-23 NOTE — Progress Notes (Addendum)
Nutrition Follow-up  DOCUMENTATION CODES:   Obesity unspecified  INTERVENTION:  - Continue Premier Protein BID.  - Continue to encourage PO intakes of meals and supplements.  - Recommend diet liberalization from Heart Healthy to Regular diet--text page sent to Dr. Tyrell Antonio. - Will order daily multivitamin with minerals.    NUTRITION DIAGNOSIS:   Increased nutrient needs related to cancer and cancer related treatments (CHF) as evidenced by estimated needs. -ongoing  GOAL:   Patient will meet greater than or equal to 90% of their needs -unmet  MONITOR:   PO intake, Supplement acceptance, Labs, Weight trends  ASSESSMENT:   46 y.o. female  with past medical history of metastatic breast cancer with extensive pulmonary mets, h/o PE on lovenox, NICM with EF 25%, a-fib, asthma admitted on 04/11/2017 with acute hypoxic respiratory failure.    8/21 No intakes documented since previous RD visit. Pt states that she ate a bowl of oatmeal for breakfast this AM without any associated abdominal pain/pressure or nausea. She reports ongoing poor appetite and the amount that she ate for breakfast this AM is equal to or more than the amount she is able to consume at most meals.   She confirms that she was drinking Animator and is appreciative of order during hospitalization. She states that she did not drink it over the weekend d/t focus on having a BM and uncontrolled pain. She was able to drink most of one carton yesterday and states she will try to drink one later today.   Per chart review, weight has fluctuated since admission. Current weight is -6 lbs/2.9 kg from admission weight. Will continue to monitor weight trends closely. Cardiology following pt and making adjustments. Palliative Care following; she remains full code at this time. Oncology states plan for compassionate use of Pembrolizumab on 8/23.   Medications reviewed; 325 mg ferrous sulfate TID, 120 mg oral Lasix BID, 20 mg  lactulose BID, 40 mg oral Protonix/day, 1 packet Miralax BID, 20 Deltasone/day, 2 tablets Senokot/day.  Labs reviewed; CBG: 92 mg/dL, Na: 134 mmol/L, Cl: 97 mmol/L, BUN: 25 mg/dL, creatinine: 1.09 mg/dL.   8/16 - Met with pt in room today.  - She is painful today and she was really not interested in engaging in conversation.  - Reports intermittent appetite and oral intake PTA but has been worse over the past 3 days.  - Pt is currently eating  25-100% of meals.  - Pt does drink Premier Protein at home.  - Per chart, pt is weight stable.  - Pt receiving palliative chemo. Palliative care met with pt today; full scope of treatment for now.   Nutrition-Focused physical exam completed. Findings are no fat depletion, no muscle depletion, and no edema.    Diet Order:  Diet Heart Room service appropriate? Yes; Fluid consistency: Thin  Skin:  Reviewed, no issues  Last BM:  8/21  Height:   Ht Readings from Last 1 Encounters:  04/12/17 5' 7"  (1.702 m)    Weight:   Wt Readings from Last 1 Encounters:  04/23/17 201 lb 8 oz (91.4 kg)    Ideal Body Weight:  61.4 kg  BMI:  Body mass index is 31.56 kg/m.  Estimated Nutritional Needs:   Kcal:  1900-2100kcal/day   Protein:  96-116g/day   Fluid:  >1.9L/day   EDUCATION NEEDS:   Education needs addressed    Jarome Matin, MS, RD, LDN, CNSC Inpatient Clinical Dietitian Pager # [phone] After hours/weekend pager # [phone]

## 2017-04-24 ENCOUNTER — Telehealth: Payer: Self-pay | Admitting: Hematology and Oncology

## 2017-04-24 LAB — BASIC METABOLIC PANEL
Anion gap: 8 (ref 5–15)
BUN: 18 mg/dL (ref 6–20)
CALCIUM: 9.4 mg/dL (ref 8.9–10.3)
CO2: 29 mmol/L (ref 22–32)
Chloride: 95 mmol/L — ABNORMAL LOW (ref 101–111)
Creatinine, Ser: 0.92 mg/dL (ref 0.44–1.00)
GFR calc Af Amer: >60 mL/min (ref >60)
GLUCOSE: 103 mg/dL — AB (ref 65–99)
POTASSIUM: 3.4 mmol/L — AB (ref 3.5–5.1)
Sodium: 132 mmol/L — ABNORMAL LOW (ref 135–145)

## 2017-04-24 LAB — CBC
HCT: 28.2 % — ABNORMAL LOW (ref 36.0–46.0)
Hemoglobin: 9 g/dL — ABNORMAL LOW (ref 12.0–15.0)
MCH: 27.6 pg (ref 26.0–34.0)
MCHC: 31.9 g/dL (ref 30.0–36.0)
MCV: 86.5 fL (ref 78.0–100.0)
PLATELETS: 462 10*3/uL — AB (ref 150–400)
RBC: 3.26 MIL/uL — ABNORMAL LOW (ref 3.87–5.11)
RDW: 18.6 % — AB (ref 11.5–15.5)
WBC: 14.9 10*3/uL — ABNORMAL HIGH (ref 4.0–10.5)

## 2017-04-24 MED ORDER — ONDANSETRON HCL 4 MG/2ML IJ SOLN: 4.0000 mg | Freq: Four times a day (QID) | INTRAMUSCULAR | Status: DC | PRN

## 2017-04-24 MED ORDER — MORPHINE SULFATE 15 MG PO TABS
15.0000 mg | ORAL_TABLET | ORAL | Status: DC | PRN
  Administered 2017-04-24 (×2): 15 mg via ORAL
  Filled 2017-04-24 (×2): qty 1

## 2017-04-24 MED ORDER — POTASSIUM CHLORIDE CRYS ER 20 MEQ PO TBCR
60.0000 meq | EXTENDED_RELEASE_TABLET | Freq: Two times a day (BID) | ORAL | Status: DC
  Administered 2017-04-24 – 2017-04-27 (×6): 60 meq via ORAL
  Filled 2017-04-24 (×6): qty 3

## 2017-04-24 MED ORDER — ONDANSETRON 4 MG PO TBDP: 4.0000 mg | ORAL_TABLET | Freq: Four times a day (QID) | ORAL | Status: DC | PRN

## 2017-04-24 NOTE — Progress Notes (Signed)
Progress Note  Patient Name: Cindy Bradshaw Date of Encounter: 04/24/2017  Primary Cardiologist: Dr. Radford Pax and Dr. Haroldine Laws for heart failure   Subjective   Pt appears comfortable this morning. She says that she is feeling better. She is back on IV pain meds. No chest pain. Still has mild dyspnea when gets up to side of bed and mild orthopnea.   Inpatient Medications    Scheduled Meds: . amiodarone  400 mg Oral BID  . benzonatate  100 mg Oral TID  . Chlorhexidine Gluconate Cloth  6 each Topical Q0600  . enoxaparin  1 mg/kg Subcutaneous BID  . fentaNYL  50 mcg Transdermal Q72H  . ferrous sulfate  325 mg Oral TID WC  . furosemide  120 mg Oral BID  . lactulose  20 g Oral BID  . levalbuterol  0.63 mg Nebulization BID  . metoprolol succinate  25 mg Oral Daily  . multivitamin with minerals  1 tablet Oral Daily  . pantoprazole  40 mg Oral Q1200  . polyethylene glycol  17 g Oral BID  . potassium chloride  40 mEq Oral BID  . predniSONE  20 mg Oral Q breakfast  . protein supplement shake  11 oz Oral BID BM  . senna  2 tablet Oral Daily  . sodium chloride flush  10-40 mL Intracatheter Q12H   Continuous Infusions: . norepinephrine (LEVOPHED) Adult infusion    . ondansetron [facility]) IV Stopped (04/24/17 0516)   PRN Meds: acetaminophen **OR** acetaminophen, albuterol, ALPRAZolam, alum & mag hydroxide-simeth, bisacodyl, diphenhydrAMINE, guaiFENesin-codeine, guaiFENesin-dextromethorphan, methocarbamol, morphine injection, promethazine, sodium chloride flush   Vital Signs    Vitals:   04/24/17 0400 04/24/17 0500 04/24/17 0600 04/24/17 0640  BP: 102/60 (!) 105/58 (!) 93/46 (!) 93/49  Pulse: 88 88 87   Resp: (!) 25 20 (!) 26   Temp: 98.1 F (36.7 C)     TempSrc: Axillary     SpO2: 100% 93% 94%   Weight: 205 lb 0.4 oz (93 kg)     Height:        Intake/Output Summary (Last 24 hours) at 04/24/17 0721 Last data filed at 04/24/17 0501  Gross per 24 hour  Intake              1776 ml  Output             3100 ml  Net            -1324 ml   Filed Weights   04/22/17 0800 04/23/17 0100 04/24/17 0400  Weight: 203 lb 11.3 oz (92.4 kg) 201 lb 8 oz (91.4 kg) 205 lb 0.4 oz (93 kg)    Telemetry    Intermittent NSR. Converted to sinus rhythm at 23:36 last night and is maintaining with rates in the 80's- Personally Reviewed  ECG    No new tracings- Personally Reviewed  Physical Exam   Physical Exam  Constitutional: She is oriented to person, place, and time. She appears well-developed.  Ill appearing. With thinned hair.   HENT:  Head: Normocephalic and atraumatic.  Neck: Normal range of motion. No JVD present.  Cardiovascular: Normal rate.  Exam reveals no gallop and no friction rub.   No murmur heard. Pulmonary/Chest: Effort normal and breath sounds normal. No respiratory distress. She has no wheezes. She has no rales. She exhibits tenderness.  Shallow breathing. Chest wall tenderness to palpation  Abdominal: Soft. She exhibits no distension. There is no tenderness.  Musculoskeletal: Normal range of motion.  She exhibits no edema.  Neurological: She is alert and oriented to person, place, and time.  Skin: Skin is warm and dry.  Psychiatric:  Blunted affect. Slightly brighter today.   Vitals reviewed.   Labs    Chemistry  Recent Labs Lab 04/22/17 [phone] 04/23/17 0731 04/24/17 0443  NA 131* 134* 132*  K 3.8 3.7 3.4*  CL 89* 97* 95*  CO2 31 29 29   GLUCOSE 98 98 103*  BUN 21* 25* 18  CREATININE 1.03* 1.09* 0.92  CALCIUM 9.7 9.6 9.4  GFRNONAA >60 59* >60  GFRAA >60 >60 >60  ANIONGAP 11 8 8      Hematology  Recent Labs Lab 04/22/17 0649 04/23/17 0731 04/24/17 0443  WBC 15.8* 14.4* 14.9*  RBC 3.31* 3.19* 3.26*  HGB 9.2* 8.7* 9.0*  HCT 28.6* 27.6* 28.2*  MCV 86.4 86.5 86.5  MCH 27.8 27.3 27.6  MCHC 32.2 31.5 31.9  RDW 18.7* 18.5* 18.6*  PLT 495* 427* 462*    Cardiac EnzymesNo results for input(s): TROPONINI in the last 168 hours. No  results for input(s): TROPIPOC in the last 168 hours.   BNPNo results for input(s): BNP, PROBNP in the last 168 hours.   DDimer No results for input(s): DDIMER in the last 168 hours.   Radiology    Dg Chest Port 1 View  Result Date: 04/22/2017 CLINICAL DATA:  Shortness of breath.  History of leukocytosis. EXAM: PORTABLE CHEST 1 VIEW COMPARISON:  04/17/2017.  CT 04/12/2017 . FINDINGS: PowerPort catheter noted with lead tip projected over right atrium. Stable cardiomegaly stable densities are noted in the lungs consistent with known metastatic disease. Basilar atelectasis. Small right pleural effusion. No pneumothorax. Surgical clips left chest. No acute bony abnormality. IMPRESSION: 1. PowerPort catheter stable position. 2. Stable pulmonary densities noted bilaterally consistent known metastatic disease. Basilar atelectasis. Small right pleural effusion. No significant changes noted from prior exams . Electronically Signed   By: Marcello Moores  Register   On: 04/22/2017 08:53    Cardiac Studies   Echo 02/12/17: EF 25-30 from 60% Cath 01/24/16:  no CAD - atypical CP  Patient Profile     46 y.o. female with a hx of Metastatic breast cancer with extensive pulmonary metastases, history of recentPE on full dose Lovenox, NICMwith EF 25%, PAF, asthmawith atrial fibrillation  Assessment & Plan    Acute on chronic combined systolic and diastolic HF  - EF 24-58% by echo in 02/2017, down from 60%. Etiology possibly related to Adriamycin vs tachy induced with afib.   - appears more comfortable   - Lasix Changed to oral dosing. 120 mg bid. 8/20  - Wt 205.  Wt is up from 201 yesterday.  - Urine output improved yesterday with 3.1L out. She is overall negative 6.5L since admit.  - Creat stable, 0.92 today.  - BP stable but soft. Levophed was ordered for BP support during aggressive diuresis, but this was not initiated as BP was stable.  - Clinically compensated today with continued slow improvement -  Continue Lasix 120 mg bid  AFIB  - Now in sinus rhythm in the 80's since last night.   - Amiodarone converted to PO 400 BID (8/20)  for one week then 200 daily after a 5g load.   - Continue with low dose metoprolol  - Her ability to maintain NSR for prolonged periods of time will be challenging. Try our best with palliative measures, AMIO.  - CHA2DS2/VAS Stroke Risk Score is 2 (CHF, female). Lovenox for anticoagulation (  was previously on Xarelto but developed PE, so was using Lovenox at home)  Breast cancer  - Stage 4, per onc.   - Palliative care team involved. Continue with aggressive care.  - Pain is an issue and oral meds were not providing her relief. Palliative care is addressing.   Signed, Daune Perch, NP  04/24/2017, 7:21 AM

## 2017-04-24 NOTE — Progress Notes (Addendum)
PROGRESS NOTE [employer] K. [name]   HEN:[id number] DOB: 23-Mar-1971  DOA: 04/11/2017 PCP: [name], MD   Brief Narrative:  Cindy Bradshaw is a 46 y.o.femalewith medical history significant of metastatic breast cancer with extensive pulmonary metastases, H/o PE on full dose lovenox, nonischemic cardiomyopathy ejection fraction of 25%, paroxysmal atrial fibrillation, asthma, presented to the ED with worsening shortness of breath, pt reports that this is gradually progressive over 1-2weeks, she was just started on O2 at 2L at her last visit with [name] and given reduced dose [place](chemo). CT chest with worsening pulm mets and small effusions. Patient admitted with acute hypoxic respiratory failure, thought to be multifactorial, asthma flare, PNA, pulmonary mets and heart failure exacerbation. She finished course of antibiotics, and is on prednisone taper. She was treated with IV lasix and amiodarone for HF. Hospital course has been complicated with dyspnea and A. fib with RVR, cardiology was consulted and she was considered for cardioversion but she has been with intermittent normal sinus rhythm, so cardioversion was cancelled. Patient also was on cardiogenic showed, for which she was on IV pressors transiently. This point patient has been diuresing well and her heart rate is well controlled on amiodarone. Patient has opted to continue full scope of treatment and oncology so she was offered immunotherapy. Now dealing with pain control and diuresis   Subjective: Patient seen and examined, she report breathing better, pain is better controlled. No complaints this am.   Assessment & Plan: Acute hypoxic respiratory failure Multifactorial, secondary to progression of her pulmonary metastases disease, CHF and asthma. She was treated with Solu-Medrol now swicthed to prednisone taper. CTA of the chest shows progression of pulmonary metastasis and small pleural effusion Onc  plan is for trying immunotherapy, despite medication is not available so unknown when this will begin Appreciate cardiology recommendations, she will continue on Lasix 120 mg twice a day, good urine output Patient's weight is slight up-to-date, report drinking a lot of water. Strict I&O's Pain control and O2 as needed  Acute on chronic systolic CHF/Nonischemic cardiomyopathy Ejection fraction 25% Cardiology felt that is related to adriamycin On oral Lasix 120 mg twice a day Continue to monitor I&O's  Daily weights  Cardio input appreciated   Metastatic breast cancer with extensive pulmonary metastases Poor prognosis Palliative care input appreciated We'll continue treatment per oncology We'll continue to adjust pain medication.  H/o PE Stable On full dose Lovenox  P.Afib Presenting with intermittent RVR Patient on amiodarone and now rhythm better control No cardioversion indicated per cardiology recommendation Continue telemetry monitoring Anticoagulation with Lovenox  Chronic pain of malignancy Pain management per palliative care We'll continue to monitor  Leukocytosis; trending down, she completed course of antibiotic, chest x-ray is stable, prednisone has been tapering down.  DVT prophylaxis: Lovenox  Code Status: FULL  Family Communication: None at bedside  Disposition Plan: TBD   Consultants:   Cardiology   Oncology   Palliative Care   Procedures:     Antimicrobials: Anti-infectives    Start     Dose/Rate Route Frequency Ordered Stop   04/13/17 1700  cefUROXime (CEFTIN) tablet 250 mg  Status:  Discontinued     250 mg Oral 2 times daily with meals 04/13/17 1119 04/18/17 1326   04/11/17 2000  cefTRIAXone (ROCEPHIN) 1 g in dextrose 5 % 50 mL IVPB  Status:  Discontinued     1 g 100 mL/hr over 30 Minutes Intravenous Every 24 hours 04/11/17 1831 04/13/17 1119  04/11/17 2000  azithromycin (ZITHROMAX) tablet 500 mg  Status:  Discontinued     500 mg  Oral Daily 04/11/17 1831 04/13/17 1119        Objective: Vitals:   04/24/17 0600 04/24/17 0640 04/24/17 0700 04/24/17 0849  BP: (!) 93/46 (!) 93/49 (!) 92/49 (!) 95/41  Pulse: 87  84 88  Resp: (!) 26  (!) 22   Temp:      TempSrc:      SpO2: 94%  94%   Weight:      Height:        Intake/Output Summary (Last 24 hours) at 04/24/17 0850 Last data filed at 04/24/17 0501  Gross per 24 hour  Intake             1766 ml  Output             3100 ml  Net            -1334 ml   Filed Weights   04/22/17 0800 04/23/17 0100 04/24/17 0400  Weight: 92.4 kg (203 lb 11.3 oz) 91.4 kg (201 lb 8 oz) 93 kg (205 lb 0.4 oz)    Examination:  General exam: Appears calm.  HEENT: OP moist and clear Respiratory system: Decreased breath sounds bilaterally, mild bronchitis bilaterally, dry cough Cardiovascular system: S1 & S2 heard, RRR. No JVD, murmurs, rubs or gallops Gastrointestinal system: Abdomen is nondistended, soft and nontender.  Central nervous system: Alert and oriented.  Extremities: No pedal edema.  Skin: No rashes, lesions or ulcers Psychiatry: Mood & affect depressed   Data Reviewed: I have personally reviewed following labs and imaging studies  CBC:  Recent Labs Lab 04/20/17 0744 04/21/17 0500 04/22/17 0649 04/23/17 0731 04/24/17 0443  WBC 13.5* 12.1* 15.8* 14.4* 14.9*  HGB 10.2* 8.8* 9.2* 8.7* 9.0*  HCT 32.1* 27.2* 28.6* 27.6* 28.2*  MCV 86.8 86.6 86.4 86.5 86.5  PLT 597* 528* 495* 427* 702*   Basic Metabolic Panel:  Recent Labs Lab 04/20/17 0744 04/21/17 0500 04/22/17 0649 04/23/17 0731 04/24/17 0443  NA 130* 125* 131* 134* 132*  K 3.8 3.1* 3.8 3.7 3.4*  CL 85* 83* 89* 97* 95*  CO2 35* 27 31 29 29   GLUCOSE 116* 448* 98 98 103*  BUN 20 22* 21* 25* 18  CREATININE 1.09* 0.94 1.03* 1.09* 0.92  CALCIUM 10.3 8.3* 9.7 9.6 9.4   GFR: Estimated Creatinine Clearance: 88.5 mL/min (by C-G formula based on SCr of 0.92 mg/dL).  CBG:  Recent Labs Lab  04/21/17 2124 04/22/17 0747 04/22/17 1257 04/22/17 2314 04/23/17 0741  GLUCAP 105* 94 122* 106* 92    No results found for this or any previous visit (from the past 240 hour(s)).    Radiology Studies: No results found.   Scheduled Meds: . amiodarone  400 mg Oral BID  . benzonatate  100 mg Oral TID  . Chlorhexidine Gluconate Cloth  6 each Topical Q0600  . enoxaparin  1 mg/kg Subcutaneous BID  . fentaNYL  50 mcg Transdermal Q72H  . ferrous sulfate  325 mg Oral TID WC  . furosemide  120 mg Oral BID  . lactulose  20 g Oral BID  . levalbuterol  0.63 mg Nebulization BID  . metoprolol succinate  25 mg Oral Daily  . multivitamin with minerals  1 tablet Oral Daily  . pantoprazole  40 mg Oral Q1200  . polyethylene glycol  17 g Oral BID  . potassium chloride  40 mEq Oral BID  .  predniSONE  20 mg Oral Q breakfast  . protein supplement shake  11 oz Oral BID BM  . senna  2 tablet Oral Daily  . sodium chloride flush  10-40 mL Intracatheter Q12H   Continuous Infusions: . norepinephrine (LEVOPHED) Adult infusion    . ondansetron [facility]) IV Stopped (04/24/17 0516)     LOS: 13 days    Time spent: Total of 25 minutes spent with pt, greater than 50% of which was spent in discussion of  treatment, counseling and coordination of care    Chipper Oman, MD Pager: Text Page via [email or web address]   If 7PM-7AM, please contact night-coverage [email or web address] 04/24/2017, 8:50 AM

## 2017-04-24 NOTE — Telephone Encounter (Signed)
This patient is an inpatient at the moment at [facility]

## 2017-04-24 NOTE — Progress Notes (Signed)
Daily Progress Note   Patient Name: Shawana Knoch. Will       Date: 04/24/2017 DOB: 02-Apr-1971  Age: 46 y.o. MRN#: [id number] Attending Physician: Patrecia Pour, Christean Grief, MD Primary Care Physician: [name], MD Admit Date: 04/11/2017  Reason for Consultation/Follow-up: Establishing goals of care and Pain control  Subjective: Ms. Carbonell reports pain has been better back on IV pain medications.  She is anxious about plan to trial orals again.  See below:  Length of Stay: 13  Current Medications: Scheduled Meds:  . amiodarone  400 mg Oral BID  . benzonatate  100 mg Oral TID  . Chlorhexidine Gluconate Cloth  6 each Topical Q0600  . enoxaparin  1 mg/kg Subcutaneous BID  . fentaNYL  50 mcg Transdermal Q72H  . ferrous sulfate  325 mg Oral TID WC  . furosemide  120 mg Oral BID  . lactulose  20 g Oral BID  . levalbuterol  0.63 mg Nebulization BID  . metoprolol succinate  25 mg Oral Daily  . multivitamin with minerals  1 tablet Oral Daily  . pantoprazole  40 mg Oral Q1200  . polyethylene glycol  17 g Oral BID  . potassium chloride  60 mEq Oral BID  . predniSONE  20 mg Oral Q breakfast  . protein supplement shake  11 oz Oral BID BM  . senna  2 tablet Oral Daily  . sodium chloride flush  10-40 mL Intracatheter Q12H    Continuous Infusions:   PRN Meds: acetaminophen **OR** acetaminophen, albuterol, ALPRAZolam, alum & mag hydroxide-simeth, bisacodyl, diphenhydrAMINE, guaiFENesin-codeine, guaiFENesin-dextromethorphan, methocarbamol, morphine, morphine injection, ondansetron **OR** ondansetron (ZOFRAN) IV, promethazine, sodium chloride flush  Physical Exam    General: Alert, awake, sleepy and appears more SOB today. Tearful HEENT: No bruits, no goiter, no JVD Heart: Regular rate and  rhythm. No murmur appreciated. Lungs: Diminished air movement, clear Abdomen: Soft, nontender, nondistended, positive bowel sounds.  Ext: No significant edema Skin: Warm and dry Neuro: Grossly intact, nonfocal.        Vital Signs: BP 108/65 (BP Location: Right Arm)   Pulse 81   Temp 98 F (36.7 C) (Oral)   Resp 19   Ht 5\' 7"  (1.702 m)   Wt 93 kg (205 lb 0.4 oz)   SpO2 100%   BMI 32.11 kg/m  SpO2: SpO2:  100 % O2 Device: O2 Device: Not Delivered O2 Flow Rate: O2 Flow Rate (L/min): 2 L/min  Intake/output summary:   Intake/Output Summary (Last 24 hours) at 04/24/17 2157 Last data filed at 04/24/17 2016  Gross per 24 hour  Intake             1188 ml  Output             1600 ml  Net             -412 ml   LBM: Last BM Date: 04/23/17 Baseline Weight: Weight: 94.3 kg (207 lb 14.3 oz) Most recent weight: Weight: 93 kg (205 lb 0.4 oz)       Palliative Assessment/Data:      Patient Active Problem List   Diagnosis Date Noted  . Acute on chronic combined systolic and diastolic heart failure ([place])   . Asthma, chronic, unspecified asthma severity, with acute exacerbation 04/11/2017  . Acute respiratory failure ([place]) 04/11/2017  . Hypoxia 04/04/2017  . Dyspnea 02/23/2017  . Acute pulmonary embolism ([place]) 02/23/2017  . Cardiomyopathy ([place])   . A-fib ([place]) 02/13/2017  . Metastatic breast cancer ([place])   . DCM (dilated cardiomyopathy) ([place])   . Atrial fibrillation with RVR ([place]) 02/11/2017  . Chest pain   . Adnexal cyst: Right per CT 12/09/2015 12/12/2015  . Chemotherapy-induced peripheral neuropathy ([place]) 12/05/2015  . Normocytic normochromic anemia 09/19/2015  . Breast cancer of upper-outer quadrant of left female breast ([place]) 09/07/2015    Palliative Care Assessment & Plan   Patient Profile: 46 y.o. female  with past medical history of metastatic breast cancer with extensive pulmonary mets, h/o PE on lovenox, NICM with EF 25%, a-fib, asthma admitted on 04/11/2017 with  acute hypoxic respiratory failure.  Cardiology has evaluated and currently on amio and levophed for a-fib and pressure support while diuresing.   She has widespread metastasis and with poor prognosis per oncology.  Plan is for trial of compassionate use of [place].  Pain remains poorly controlled and palliative consulted for pain management and goals of care.      Recommendations/Plan: Pain: Reports poor pain control with oral medication with last trial.  She is anxious about plan to transition again to orals.  Her outpatient rescue dose has been 30mg  of oral morphine, but with her soft pressures and higher fentanyl dose, I have been wanting to see how she tolerates lower doses of oral rescue meds as she has been having good control with lower opioid equivalents of her IV meds.  Plan for trial of morphine 15mg  PO, but I also left morphine IV 2mg  every 2 hours as needed.  If this is ineffective and she tolerates, would consider increase to prior home rescue dose of 30mg .  Goals of Care and Additional Recommendations: Limitations on Scope of Treatment: Full Scope Treatment- Has reported she is tired of doctors continuing to discuss- "I know what I am up against, and I am tired of talking about it over and over."    Code Status:    Code Status Orders        Start     Ordered   04/11/17 1831  Full code  Continuous     04/11/17 1831    Code Status History    Date Active Date Inactive Code Status Order ID Comments User Context   02/23/2017  8:44 AM 02/26/2017  6:00 PM Full Code [id number]  Radene Gunning, NP ED   02/11/2017  4:13 PM 02/14/2017  6:33 PM Full Code [id number]  Robbie Lis, MD Inpatient   02/21/2016  4:25 PM 02/22/2016 11:29 AM Full Code [id number]  Irene Limbo, MD Inpatient   01/21/2016  6:36 PM 01/25/2016  3:06 PM Full Code [id number]  Debbe Odea, MD ED   12/09/2015  1:58 AM 12/12/2015  4:32 PM Full Code [id number]  Ivor Costa, MD ED       Prognosis:  Guarded  Discharge  Planning:  To Be Determined  Care plan was discussed with patient, RN, Dr Quincy Simmonds   Thank you for allowing the Palliative Medicine Team to assist in the care of this patient.   Total Time 25 Prolonged Time Billed No      Greater than 50%  of this time was spent counseling and coordinating care related to the above assessment and plan.  Micheline Rough, MD  Please contact Palliative Medicine Team phone at [phone] for questions and concerns.

## 2017-04-25 ENCOUNTER — Ambulatory Visit: Payer: Federal, State, Local not specified - PPO

## 2017-04-25 ENCOUNTER — Other Ambulatory Visit: Payer: Federal, State, Local not specified - PPO

## 2017-04-25 ENCOUNTER — Ambulatory Visit: Payer: Federal, State, Local not specified - PPO | Admitting: Hematology and Oncology

## 2017-04-25 DIAGNOSIS — C7800 Secondary malignant neoplasm of unspecified lung: Principal | ICD-10-CM

## 2017-04-25 DIAGNOSIS — I429 Cardiomyopathy, unspecified: Secondary | ICD-10-CM

## 2017-04-25 DIAGNOSIS — J9690 Respiratory failure, unspecified, unspecified whether with hypoxia or hypercapnia: Secondary | ICD-10-CM

## 2017-04-25 DIAGNOSIS — Z86711 Personal history of pulmonary embolism: Secondary | ICD-10-CM

## 2017-04-25 LAB — COMPREHENSIVE METABOLIC PANEL
ALK PHOS: 87 U/L (ref 38–126)
ALT: 75 U/L — ABNORMAL HIGH (ref 14–54)
ANION GAP: 9 (ref 5–15)
AST: 83 U/L — ABNORMAL HIGH (ref 15–41)
Albumin: 2.3 g/dL — ABNORMAL LOW (ref 3.5–5.0)
BUN: 18 mg/dL (ref 6–20)
CALCIUM: 9.6 mg/dL (ref 8.9–10.3)
CO2: 29 mmol/L (ref 22–32)
Chloride: 95 mmol/L — ABNORMAL LOW (ref 101–111)
Creatinine, Ser: 0.97 mg/dL (ref 0.44–1.00)
GFR calc Af Amer: >60 mL/min (ref >60)
GFR calc non Af Amer: >60 mL/min (ref >60)
Glucose, Bld: 141 mg/dL — ABNORMAL HIGH (ref 65–99)
POTASSIUM: 3.4 mmol/L — AB (ref 3.5–5.1)
SODIUM: 133 mmol/L — AB (ref 135–145)
TOTAL PROTEIN: 6.8 g/dL (ref 6.5–8.1)
Total Bilirubin: 0.6 mg/dL (ref 0.3–1.2)

## 2017-04-25 LAB — CBC WITH DIFFERENTIAL/PLATELET
BASOS PCT: 0 %
Basophils Absolute: 0 10*3/uL (ref 0.0–0.1)
EOS ABS: 0 10*3/uL (ref 0.0–0.7)
EOS PCT: 0 %
HEMATOCRIT: 28.8 % — AB (ref 36.0–46.0)
Hemoglobin: 9.2 g/dL — ABNORMAL LOW (ref 12.0–15.0)
LYMPHS ABS: 2.1 10*3/uL (ref 0.7–4.0)
Lymphocytes Relative: 15 %
MCH: 27.6 pg (ref 26.0–34.0)
MCHC: 31.9 g/dL (ref 30.0–36.0)
MCV: 86.5 fL (ref 78.0–100.0)
MONO ABS: 1.1 10*3/uL — AB (ref 0.1–1.0)
MONOS PCT: 8 %
Neutro Abs: 11.1 10*3/uL — ABNORMAL HIGH (ref 1.7–7.7)
Neutrophils Relative %: 77 %
PLATELETS: 477 10*3/uL — AB (ref 150–400)
RBC: 3.33 MIL/uL — AB (ref 3.87–5.11)
RDW: 18.4 % — AB (ref 11.5–15.5)
WBC: 14.3 10*3/uL — ABNORMAL HIGH (ref 4.0–10.5)

## 2017-04-25 LAB — BASIC METABOLIC PANEL
Anion gap: 10 (ref 5–15)
BUN: 17 mg/dL (ref 6–20)
CALCIUM: 9.5 mg/dL (ref 8.9–10.3)
CHLORIDE: 93 mmol/L — AB (ref 101–111)
CO2: 30 mmol/L (ref 22–32)
CREATININE: 0.83 mg/dL (ref 0.44–1.00)
GFR calc non Af Amer: >60 mL/min (ref >60)
Glucose, Bld: 91 mg/dL (ref 65–99)
Potassium: 3.6 mmol/L (ref 3.5–5.1)
Sodium: 133 mmol/L — ABNORMAL LOW (ref 135–145)

## 2017-04-25 MED ORDER — EPINEPHRINE PF 1 MG/10ML IJ SOSY: 0.2500 mg | PREFILLED_SYRINGE | Freq: Once | INTRAMUSCULAR | Status: DC | PRN

## 2017-04-25 MED ORDER — DIPHENHYDRAMINE HCL 50 MG/ML IJ SOLN: 25.0000 mg | Freq: Once | INTRAMUSCULAR | Status: DC | PRN

## 2017-04-25 MED ORDER — DIGOXIN 125 MCG PO TABS
0.3750 mg | ORAL_TABLET | Freq: Every day | ORAL | Status: DC
  Administered 2017-04-27: 0.375 mg via ORAL
  Filled 2017-04-25: qty 3

## 2017-04-25 MED ORDER — ALBUTEROL SULFATE (2.5 MG/3ML) 0.083% IN NEBU: 2.5000 mg | INHALATION_SOLUTION | Freq: Once | RESPIRATORY_TRACT | Status: DC | PRN

## 2017-04-25 MED ORDER — ALTEPLASE 2 MG IJ SOLR: 2.0000 mg | Freq: Once | INTRAMUSCULAR | Status: DC | PRN

## 2017-04-25 MED ORDER — HEPARIN SOD (PORK) LOCK FLUSH 100 UNIT/ML IV SOLN: 500.0000 [IU] | Freq: Once | INTRAVENOUS | Status: DC | PRN

## 2017-04-25 MED ORDER — SODIUM CHLORIDE 0.9% FLUSH: 10.0000 mL | INTRAVENOUS | Status: DC | PRN

## 2017-04-25 MED ORDER — EPINEPHRINE PF 1 MG/ML IJ SOLN: 0.5000 mg | Freq: Once | INTRAMUSCULAR | Status: DC | PRN

## 2017-04-25 MED ORDER — PEMBROLIZUMAB CHEMO INJECTION 100 MG/4ML
200.0000 mg | Freq: Once | INTRAVENOUS | Status: AC
  Administered 2017-04-25: 200 mg via INTRAVENOUS
  Filled 2017-04-25: qty 8

## 2017-04-25 MED ORDER — SODIUM CHLORIDE 0.9 % IV SOLN
Freq: Once | INTRAVENOUS | Status: AC
  Administered 2017-04-25: 14:00:00 via INTRAVENOUS

## 2017-04-25 MED ORDER — FAMOTIDINE IN NACL 20-0.9 MG/50ML-% IV SOLN: 20.0000 mg | Freq: Once | INTRAVENOUS | Status: DC | PRN

## 2017-04-25 MED ORDER — DIGOXIN 125 MCG PO TABS
0.5000 mg | ORAL_TABLET | Freq: Once | ORAL | Status: AC
  Administered 2017-04-25: 0.5 mg via ORAL
  Filled 2017-04-25: qty 4

## 2017-04-25 MED ORDER — SODIUM CHLORIDE 0.9% FLUSH: 3.0000 mL | INTRAVENOUS | Status: DC | PRN

## 2017-04-25 MED ORDER — MORPHINE SULFATE 15 MG PO TABS
30.0000 mg | ORAL_TABLET | ORAL | Status: DC | PRN
Start: 2017-04-25 — End: 2017-04-27
  Administered 2017-04-25 – 2017-04-27 (×13): 30 mg via ORAL
  Filled 2017-04-25 (×14): qty 2

## 2017-04-25 MED ORDER — METHYLPREDNISOLONE SODIUM SUCC 125 MG IJ SOLR: 125.0000 mg | Freq: Once | INTRAMUSCULAR | Status: DC | PRN

## 2017-04-25 MED ORDER — METOPROLOL SUCCINATE ER 25 MG PO TB24
12.5000 mg | ORAL_TABLET | Freq: Every day | ORAL | Status: DC
  Administered 2017-04-26 – 2017-04-27 (×2): 12.5 mg via ORAL
  Filled 2017-04-25 (×2): qty 1

## 2017-04-25 MED ORDER — DIGOXIN 125 MCG PO TABS
0.2500 mg | ORAL_TABLET | Freq: Three times a day (TID) | ORAL | Status: AC
  Administered 2017-04-25 – 2017-04-26 (×2): 0.25 mg via ORAL
  Filled 2017-04-25 (×2): qty 2

## 2017-04-25 MED ORDER — SODIUM CHLORIDE 0.9 % IV SOLN: Freq: Once | INTRAVENOUS | Status: DC | PRN

## 2017-04-25 MED ORDER — HEPARIN SOD (PORK) LOCK FLUSH 100 UNIT/ML IV SOLN: 250.0000 [IU] | Freq: Once | INTRAVENOUS | Status: DC | PRN

## 2017-04-25 MED ORDER — DIPHENHYDRAMINE HCL 50 MG/ML IJ SOLN: 50.0000 mg | Freq: Once | INTRAMUSCULAR | Status: DC | PRN

## 2017-04-25 NOTE — Progress Notes (Signed)
PROGRESS NOTE [employer] K. [name]   PZW:[id number] DOB: 19-Mar-1971  DOA: 04/11/2017 PCP: [name], MD   Brief Narrative:  Cindy Bradshaw is a 46 y.o.femalewith medical history significant of metastatic breast cancer with extensive pulmonary metastases, H/o PE on full dose lovenox, nonischemic cardiomyopathy ejection fraction of 25%, paroxysmal atrial fibrillation, asthma, presented to the ED with worsening shortness of breath, pt reports that this is gradually progressive over 1-2weeks, she was just started on O2 at 2L at her last visit with [name] and given reduced dose [place](chemo). CT chest with worsening pulm mets and small effusions. Patient admitted with acute hypoxic respiratory failure, thought to be multifactorial, asthma flare, PNA, pulmonary mets and heart failure exacerbation. She finished course of antibiotics, and is on prednisone taper. She was treated with IV lasix and amiodarone for HF. Hospital course has been complicated with dyspnea and A. fib with RVR, cardiology was consulted and she was considered for cardioversion but she has been with intermittent normal sinus rhythm, so cardioversion was cancelled. Patient also was on cardiogenic showed, for which she was on IV pressors transiently. This point patient has been diuresing well and her heart rate is well controlled on amiodarone. Patient has opted to continue full scope of treatment and oncology so she was offered immunotherapy. Now dealing with pain control and diuresis   Subjective: Patient seen and examined, report some pain this morning, was not able to sleep well last night. Patient now oxygen as she feels slightly short of breath. Overnight went back into A. fib with rates up to the 120s. Patient denies chest pain, palpitations and dizziness  Assessment & Plan: Acute hypoxic respiratory failure. Improved Multifactorial, secondary to progression of her pulmonary metastases disease, CHF  and asthma. She was treated with Solu-Medrol now swicthed to prednisone taper. CTA of the chest shows progression of pulmonary metastasis and small pleural effusion Onc plan is for trying immunotherapy, despite medication is not available so unknown when this will begin Appreciate cardiology recommendations, she will continue on Lasix 120 mg twice a day, good urine output Patient's weight is slight up-to-date, report drinking a lot of water. Strict I&O's Pain control and O2 as needed  Acute on chronic systolic CHF/Nonischemic cardiomyopathy Ejection fraction 25% Cardiology felt that is related to adriamycin On oral Lasix 120 mg twice a day with good diuresis - neg 7.8L during admission  Patient on coreg 25 mg, BP soft. Patient report her pain is not well controlled with low BP is hard to increase opiate dose, I will decrease Coreg to 12.5 mg and continue to monitor  Continue to monitor I&O's  Daily weights  Cardio input appreciated   Metastatic breast cancer with extensive pulmonary metastases Poor prognosis Palliative care input appreciated We'll continue treatment per oncology - schedule to start immunotherapy today  Pain medication adjusted, will try to avoid IV morphine for now in preparation for discharge.   H/o PE Stable On full dose Lovenox  P.Afib back to A. fib and dyspnea Presenting with intermittent RVR Patient amiodarone and beta blocker (coreg) Maybe metoprolol would be more beneficial in term of rate control, I will defer to cardiology  No cardioversion indicated per cardiology recommendation Continue telemetry monitoring Anticoagulation with Lovenox  Chronic pain of malignancy Adjusting pain management with palliative care   Leukocytosis; trending down, she completed course of antibiotic, chest x-ray is stable, prednisone has been tapering down.  DVT prophylaxis: Lovenox  Code Status: FULL  Family Communication: None  at bedside  Disposition Plan: Home in  the next 24-48 hrs if stable   Consultants:   Cardiology   Oncology   Palliative Care   Procedures:     Antimicrobials: Anti-infectives    Start     Dose/Rate Route Frequency Ordered Stop   04/13/17 1700  cefUROXime (CEFTIN) tablet 250 mg  Status:  Discontinued     250 mg Oral 2 times daily with meals 04/13/17 1119 04/18/17 1326   04/11/17 2000  cefTRIAXone (ROCEPHIN) 1 g in dextrose 5 % 50 mL IVPB  Status:  Discontinued     1 g 100 mL/hr over 30 Minutes Intravenous Every 24 hours 04/11/17 1831 04/13/17 1119   04/11/17 2000  azithromycin (ZITHROMAX) tablet 500 mg  Status:  Discontinued     500 mg Oral Daily 04/11/17 1831 04/13/17 1119       Objective: Vitals:   04/24/17 2136 04/25/17 0543 04/25/17 0700 04/25/17 0839  BP:  104/60  (!) 107/58  Pulse:  (!) 119  78  Resp:  20  (!) 22  Temp:  99.1 F (37.3 C)  98.7 F (37.1 C)  TempSrc:  Oral  Oral  SpO2: 100% 98%  99%  Weight:   93.6 kg (206 lb 5.6 oz)   Height:        Intake/Output Summary (Last 24 hours) at 04/25/17 0903 Last data filed at 04/25/17 9833  Gross per 24 hour  Intake              360 ml  Output             1800 ml  Net            -1440 ml   Filed Weights   04/23/17 0100 04/24/17 0400 04/25/17 0700  Weight: 91.4 kg (201 lb 8 oz) 93 kg (205 lb 0.4 oz) 93.6 kg (206 lb 5.6 oz)    Examination:  General: Patient anxious and crying, due to pain  Cardiovascular: IRR IRR, S1/S2 +, no rubs, no gallops Respiratory: Decrease breath sounds b/l, diffuse rhonchi Abdominal: Soft, NT, ND, bowel sounds + Extremities: no edema, no cyanosis Psych: Mood depressed   Data Reviewed: I have personally reviewed following labs and imaging studies  CBC:  Recent Labs Lab 04/21/17 0500 04/22/17 0649 04/23/17 0731 04/24/17 0443 04/25/17 0423  WBC 12.1* 15.8* 14.4* 14.9* 14.3*  NEUTROABS  --   --   --   --  11.1*  HGB 8.8* 9.2* 8.7* 9.0* 9.2*  HCT 27.2* 28.6* 27.6* 28.2* 28.8*  MCV 86.6 86.4 86.5 86.5 86.5    PLT 528* 495* 427* 462* 825*   Basic Metabolic Panel:  Recent Labs Lab 04/21/17 0500 04/22/17 0649 04/23/17 0731 04/24/17 0443 04/25/17 0423  NA 125* 131* 134* 132* 133*  K 3.1* 3.8 3.7 3.4* 3.6  CL 83* 89* 97* 95* 93*  CO2 27 31 29 29 30   GLUCOSE 448* 98 98 103* 91  BUN 22* 21* 25* 18 17  CREATININE 0.94 1.03* 1.09* 0.92 0.83  CALCIUM 8.3* 9.7 9.6 9.4 9.5   GFR: Estimated Creatinine Clearance: 98.4 mL/min (by C-G formula based on SCr of 0.83 mg/dL).  CBG:  Recent Labs Lab 04/21/17 2124 04/22/17 0747 04/22/17 1257 04/22/17 2314 04/23/17 0741  GLUCAP 105* 94 122* 106* 92    No results found for this or any previous visit (from the past 240 hour(s)).    Radiology Studies: No results found.   Scheduled Meds: . amiodarone  400  mg Oral BID  . benzonatate  100 mg Oral TID  . Chlorhexidine Gluconate Cloth  6 each Topical Q0600  . enoxaparin  1 mg/kg Subcutaneous BID  . fentaNYL  50 mcg Transdermal Q72H  . ferrous sulfate  325 mg Oral TID WC  . furosemide  120 mg Oral BID  . lactulose  20 g Oral BID  . [START ON 04/26/2017] metoprolol succinate  12.5 mg Oral Daily  . multivitamin with minerals  1 tablet Oral Daily  . pantoprazole  40 mg Oral Q1200  . polyethylene glycol  17 g Oral BID  . potassium chloride  60 mEq Oral BID  . predniSONE  20 mg Oral Q breakfast  . protein supplement shake  11 oz Oral BID BM  . senna  2 tablet Oral Daily  . sodium chloride flush  10-40 mL Intracatheter Q12H   Continuous Infusions:    LOS: 14 days    Time spent: Total of 25 minutes spent with pt, greater than 50% of which was spent in discussion of  treatment, counseling and coordination of care    Chipper Oman, MD Pager: Text Page via [email or web address]   If 7PM-7AM, please contact night-coverage [email or web address] 04/25/2017, 9:03 AM

## 2017-04-25 NOTE — Progress Notes (Signed)
Progress Note  Patient Name: Cindy Bradshaw Date of Encounter: 04/25/2017  Primary Cardiologist: Dr. Radford Pax and Dr. Haroldine Laws for heart failure   Subjective   Pt is feeling better with slight improvement in breathing since yesterday. He biggest complaint is chest pain related to cancer mets. She appears very comfortable this morning, states that her pain is being managed with the pain meds. She has no awareness of atrial fib.  Inpatient Medications    Scheduled Meds: . amiodarone  400 mg Oral BID  . benzonatate  100 mg Oral TID  . Chlorhexidine Gluconate Cloth  6 each Topical Q0600  . enoxaparin  1 mg/kg Subcutaneous BID  . fentaNYL  50 mcg Transdermal Q72H  . ferrous sulfate  325 mg Oral TID WC  . furosemide  120 mg Oral BID  . lactulose  20 g Oral BID  . [START ON 04/26/2017] metoprolol succinate  12.5 mg Oral Daily  . multivitamin with minerals  1 tablet Oral Daily  . pantoprazole  40 mg Oral Q1200  . polyethylene glycol  17 g Oral BID  . potassium chloride  60 mEq Oral BID  . predniSONE  20 mg Oral Q breakfast  . protein supplement shake  11 oz Oral BID BM  . senna  2 tablet Oral Daily  . sodium chloride flush  10-40 mL Intracatheter Q12H   Continuous Infusions:  PRN Meds: acetaminophen **OR** acetaminophen, albuterol, ALPRAZolam, alum & mag hydroxide-simeth, bisacodyl, diphenhydrAMINE, guaiFENesin-codeine, guaiFENesin-dextromethorphan, methocarbamol, morphine, ondansetron **OR** ondansetron (ZOFRAN) IV, promethazine, sodium chloride flush   Vital Signs    Vitals:   04/24/17 2136 04/25/17 0543 04/25/17 0700 04/25/17 0839  BP:  104/60  (!) 107/58  Pulse:  (!) 119  78  Resp:  20  (!) 22  Temp:  99.1 F (37.3 C)  98.7 F (37.1 C)  TempSrc:  Oral  Oral  SpO2: 100% 98%  99%  Weight:   206 lb 5.6 oz (93.6 kg)   Height:        Intake/Output Summary (Last 24 hours) at 04/25/17 1038 Last data filed at 04/25/17 1610  Gross per 24 hour  Intake              240 ml   Output             1800 ml  Net            -1560 ml   Filed Weights   04/23/17 0100 04/24/17 0400 04/25/17 0700  Weight: 201 lb 8 oz (91.4 kg) 205 lb 0.4 oz (93 kg) 206 lb 5.6 oz (93.6 kg)    Telemetry    Atrial fibrillation in the 120's since 21:30 last night Personally Reviewed  ECG    Atrial fibrillation at 119 bpm- Personally Reviewed  Physical Exam  Physical Exam  Constitutional: She is oriented to person, place, and time. She appears well-developed.  Ill appearing. With thinned hair.   HENT:  Head: Normocephalic and atraumatic.  Neck: Normal range of motion. No JVD present.  Cardiovascular: An irregularly irregular rhythm present. Tachycardia present.  Exam reveals no gallop and no friction rub.   No murmur heard. Pulmonary/Chest: Effort normal and breath sounds normal. No respiratory distress. She has no wheezes. She has no rales. She exhibits tenderness.  Shallow breathing. Chest wall tenderness to palpation  Abdominal: Soft. She exhibits no distension. There is no tenderness.  Musculoskeletal: Normal range of motion. She exhibits no edema.  Neurological: She is alert and  oriented to person, place, and time.  Skin: Skin is warm and dry.  Psychiatric:  Blunted affect. Slightly brighter today.   Vitals reviewed.   Labs    Chemistry  Recent Labs Lab 04/23/17 0731 04/24/17 0443 04/25/17 0423  NA 134* 132* 133*  K 3.7 3.4* 3.6  CL 97* 95* 93*  CO2 29 29 30   GLUCOSE 98 103* 91  BUN 25* 18 17  CREATININE 1.09* 0.92 0.83  CALCIUM 9.6 9.4 9.5  GFRNONAA 59* >60 >60  GFRAA >60 >60 >60  ANIONGAP 8 8 10      Hematology  Recent Labs Lab 04/23/17 0731 04/24/17 0443 04/25/17 0423  WBC 14.4* 14.9* 14.3*  RBC 3.19* 3.26* 3.33*  HGB 8.7* 9.0* 9.2*  HCT 27.6* 28.2* 28.8*  MCV 86.5 86.5 86.5  MCH 27.3 27.6 27.6  MCHC 31.5 31.9 31.9  RDW 18.5* 18.6* 18.4*  PLT 427* 462* 477*    Cardiac EnzymesNo results for input(s): TROPONINI in the last 168 hours. No  results for input(s): TROPIPOC in the last 168 hours.   BNPNo results for input(s): BNP, PROBNP in the last 168 hours.   DDimer No results for input(s): DDIMER in the last 168 hours.   Radiology    No results found.  Cardiac Studies   Echo 02/12/17: EF 25-30 from 60% Cath 01/24/16:  no CAD - atypical CP  Patient Profile     46 y.o. female with a hx of Metastatic breast cancer with extensive pulmonary metastases, history of recentPE on full dose Lovenox, NICMwith EF 25%, PAF, asthmawith atrial fibrillation  Assessment & Plan    Acute on chronic combined systolic and diastolic HF  - EF 67-89% by echo in 02/2017, down from 60%. Etiology possibly related to Adriamycin vs tachy induced with afib.   - appears more comfortable   - Lasix Changed to oral dosing. 120 mg bid. 8/20  - Wt 206.  Wt is fairly stable. - Urine output 1.8L yesterday.  She is overall negative 7.8L since admit.  - Creat stable, 0.83 today.  - BP stable but soft. Levophed was ordered for BP support during aggressive diuresis, but this was not initiated as BP was stable.  - Clinically compensated today with continued slow improvement - Continue Lasix 120 mg bid  AFIB  - Amiodarone converted to PO 400 BID (8/20)  for one week then 200 daily after a 5g load.   - Continue with low dose metoprolol - Unfortunately the patient went back into atrial fibrillation with rates in the 120's since 21:30 last night. She seems to be tolerating well.  - Her ability to maintain NSR for prolonged periods of time will be challenging. Try our best with palliative measures, AMIO.  - CHA2DS2/VAS Stroke Risk Score is 2 (CHF, female). Lovenox for anticoagulation (was previously on Xarelto but developed PE, so was using Lovenox at home)  Breast cancer - Stage 4, per onc. Widespread metastasis and poor prognosis. She is starting Pembrolizumab immunotherapy today.  - Palliative care team involved. Continue with aggressive care.  - Pain is  an issue and oral meds were not providing her relief. Palliative care is addressing.   Signed, Daune Perch, NP  04/25/2017, 10:38 AM

## 2017-04-25 NOTE — Progress Notes (Signed)
Manual calculation of BSA and dosing for Keytruda completed.  Verification performed by Jena Gauss RN and Drue Dun RN

## 2017-04-25 NOTE — Progress Notes (Signed)
Chemotherapy consent signed by patient at 2:25 pm.

## 2017-04-25 NOTE — Progress Notes (Signed)
PT Cancellation Note  Patient Details Name: Cindy Bradshaw MRN: [id number] DOB: 24-Feb-1971   Cancelled Treatment:    Reason Eval/Treat Not Completed: Medical issues which prohibited therapy (pt receiving new chemo, will check back as schedule permits)   Hernando Reali,KATHrine E 04/25/2017, 3:48 PM Carmelia Bake, PT, DPT 04/25/2017 Pager: [phone]

## 2017-04-25 NOTE — Progress Notes (Addendum)
Cindy Bradshaw   DOB:[birth year]   DX#:[id number]   ZJQ#:[id number]  Subjective: Cindy Bradshaw is a 46 year old woman with metastatic breast cancer to the lung, h/o PE on Lovenox, and cardiomyopathy with an LVEF of 25%, paroxysmal a fib, asthma, who has been admitted to the hospital for acute hypoxic respiratory failure (multifactorial), asthma flare, PNA, pulmonary metastases, and heart failure exacerbation.  She was admitted to the hospital on 04/11/2017.    Cindy Bradshaw tells me that she is feeling better today.  She says her pain is under better control, along with her shortness of breath.  She is looking forward to making a discharge plan for home.  Her last bowel movement was two days ago.  She is wondering when she will receive the Pembrolizumab.  She tells me that she does have shortness of breath, but it is managed well with her supplemental oxygen.  She is able to get up and use the restroom, and she is able to feed herself without getting to fatigued or winded.  She does sleep with the head of her bed elevated to help her breathe better.     Objective:  Vitals:   04/25/17 0543 04/25/17 0839  BP: 104/60 (!) 107/58  Pulse: (!) 119 78  Resp: 20 (!) 22  Temp: 99.1 F (37.3 C) 98.7 F (37.1 C)  SpO2: 98% 99%    Body mass index is 32.32 kg/m.  Intake/Output Summary (Last 24 hours) at 04/25/17 1032 Last data filed at 04/25/17 2409  Gross per 24 hour  Intake              240 ml  Output             1800 ml  Net            -1560 ml    Lying in bed, she appears chronically ill, and tired  Sclerae unicteric  Oropharynx shows no thrush or other lesions  No cervical or supraclavicular adenopathy  Lungs no rales or wheezes, diminished bilaterally  Heart irregularly irregular  Abdomen soft, NT, ND, bowel sounds present, but hypoactive  Neuro nonfocal, flat affect    CBG (last 3)   Recent Labs  04/22/17 1257 04/22/17 2314 04/23/17 0741  GLUCAP 122* 106* 92     Labs:  Lab Results   Component Value Date   WBC 14.3 (H) 04/25/2017   HGB 9.2 (L) 04/25/2017   HCT 28.8 (L) 04/25/2017   MCV 86.5 04/25/2017   PLT 477 (H) 04/25/2017   NEUTROABS 11.1 (H) 73/53/2992    Basic Metabolic Panel:  Recent Labs Lab 04/21/17 0500 04/22/17 0649 04/23/17 0731 04/24/17 0443 04/25/17 0423  NA 125* 131* 134* 132* 133*  K 3.1* 3.8 3.7 3.4* 3.6  CL 83* 89* 97* 95* 93*  CO2 27 31 29 29 30   GLUCOSE 448* 98 98 103* 91  BUN 22* 21* 25* 18 17  CREATININE 0.94 1.03* 1.09* 0.92 0.83  CALCIUM 8.3* 9.7 9.6 9.4 9.5   GFR Estimated Creatinine Clearance: 98.4 mL/min (by C-G formula based on SCr of 0.83 mg/dL).  CBC:  Recent Labs Lab 04/21/17 0500 04/22/17 0649 04/23/17 0731 04/24/17 0443 04/25/17 0423  WBC 12.1* 15.8* 14.4* 14.9* 14.3*  NEUTROABS  --   --   --   --  11.1*  HGB 8.8* 9.2* 8.7* 9.0* 9.2*  HCT 27.2* 28.6* 27.6* 28.2* 28.8*  MCV 86.6 86.4 86.5 86.5 86.5  PLT 528* 495* 427* 462* 477*  Recent Labs Lab 04/21/17 2124 04/22/17 0747 04/22/17 1257 04/22/17 2314 04/23/17 0741  GLUCAP 105* 94 122* 106* 92    Assessment/Plan:   1. Metastatic Triple Negative Breast Cancer: I reviewed with her that the Pembrolizumab is available and she will start treatment with this.  She will need a TSH and CMP drawn today.  It would also be ideal to taper her Prednisone off.  She verbalized that she has discussed this treatment in detail with Dr. Lindi Adie previously and she doesn't have any questions about the treatment.  I have given her detailed information about Pembrolizumab from Up to date patient information.  I discussed with her considering a do not attempt resuscitation order, and explained that she would not survive and have meaningful life following intubation and chest compressions, and that the process of attempting resuscitation would be painful.  She told me that she would think about her code status.    2. Atrial Fibrillation and CHF: Under the care of cardiology  and the hospitalists.    The above was reviewed with Dr. Lindi Adie in detail.  He assisted with formulating the patient's plan.  He will come by later today to evaluate the patient.    Scot Dock, NP 04/25/2017  10:32 AM Medical Oncology and Hematology [facility] [address] [place], [place] [address] Tel. [phone]    Fax. [phone]  Attending Note  I personally saw the patient, reviewed the chart and examined the patient. The plan of care was discussed with the patient and the admitting team. I agree with the assessment and plan as documented above.

## 2017-04-25 NOTE — Progress Notes (Signed)
Chemotherapy administration of Keytruda completed.  Patient tolerated without any complaints.

## 2017-04-25 NOTE — Progress Notes (Signed)
MEDICAL ONCOLOGY  We were able to obtain Pembrolizumab immunotherapy for the patient. Our plan is to administer the treatment today. Patient is currently on prednisone 20 mg daily. It would be ideal if the prednisone can be tapered off. She will need TSH and CMP done today. Patient understands the risks and benefits of treatment with immunotherapy. These risks include risk of hypothyroidism, colitis, hepatitis, pituitary insufficiency, dermatitis and any other inflammation of internal organs. Mendel Ryder my nurse practitioner is going to counsel her again. I will see the patient later this morning

## 2017-04-26 ENCOUNTER — Encounter: Payer: Self-pay | Admitting: Pharmacy Technician

## 2017-04-26 DIAGNOSIS — Z171 Estrogen receptor negative status [ER-]: Secondary | ICD-10-CM

## 2017-04-26 DIAGNOSIS — Z515 Encounter for palliative care: Secondary | ICD-10-CM

## 2017-04-26 LAB — BASIC METABOLIC PANEL
ANION GAP: 8 (ref 5–15)
BUN: 15 mg/dL (ref 6–20)
CHLORIDE: 95 mmol/L — AB (ref 101–111)
CO2: 28 mmol/L (ref 22–32)
CREATININE: 0.72 mg/dL (ref 0.44–1.00)
Calcium: 9.5 mg/dL (ref 8.9–10.3)
GFR calc non Af Amer: >60 mL/min (ref >60)
Glucose, Bld: 124 mg/dL — ABNORMAL HIGH (ref 65–99)
POTASSIUM: 4.2 mmol/L (ref 3.5–5.1)
SODIUM: 131 mmol/L — AB (ref 135–145)

## 2017-04-26 LAB — TSH: TSH: 1.022 u[IU]/mL (ref 0.350–4.500)

## 2017-04-26 MED ORDER — PREDNISONE 10 MG PO TABS
10.0000 mg | ORAL_TABLET | Freq: Every day | ORAL | Status: DC
  Administered 2017-04-27: 10 mg via ORAL
  Filled 2017-04-26: qty 1

## 2017-04-26 NOTE — Progress Notes (Signed)
Progress Note  Patient Name: Cindy Bradshaw Date of Encounter: 04/26/2017  Primary Cardiologist: Dr. Radford Pax and Dr. Haroldine Laws  Subjective   Feeling a little better.  Pain under better control.    Inpatient Medications    Scheduled Meds: . amiodarone  400 mg Oral BID  . benzonatate  100 mg Oral TID  . Chlorhexidine Gluconate Cloth  6 each Topical Q0600  . [START ON 04/27/2017] digoxin  0.375 mg Oral Daily  . enoxaparin  1 mg/kg Subcutaneous BID  . fentaNYL  50 mcg Transdermal Q72H  . ferrous sulfate  325 mg Oral TID WC  . furosemide  120 mg Oral BID  . lactulose  20 g Oral BID  . metoprolol succinate  12.5 mg Oral Daily  . multivitamin with minerals  1 tablet Oral Daily  . pantoprazole  40 mg Oral Q1200  . polyethylene glycol  17 g Oral BID  . potassium chloride  60 mEq Oral BID  . predniSONE  20 mg Oral Q breakfast  . protein supplement shake  11 oz Oral BID BM  . senna  2 tablet Oral Daily  . sodium chloride flush  10-40 mL Intracatheter Q12H   Continuous Infusions: . sodium chloride    . famotidine     PRN Meds: sodium chloride, acetaminophen **OR** acetaminophen, albuterol, albuterol, ALPRAZolam, alteplase, alum & mag hydroxide-simeth, bisacodyl, diphenhydrAMINE, diphenhydrAMINE, diphenhydrAMINE, EPINEPHrine, EPINEPHrine, EPINEPHrine, EPINEPHrine, famotidine, guaiFENesin-codeine, guaiFENesin-dextromethorphan, heparin lock flush, heparin lock flush, methocarbamol, methylPREDNISolone sodium succinate, morphine, ondansetron **OR** ondansetron (ZOFRAN) IV, promethazine, sodium chloride flush, sodium chloride flush, sodium chloride flush   Vital Signs    Vitals:   04/25/17 1412 04/25/17 2123 04/26/17 0526 04/26/17 0857  BP: 96/82 108/77 (!) 103/59 (!) 109/56  Pulse:  93 81 80  Resp: (!) 22 18 18    Temp: 98.7 F (37.1 C) 98.1 F (36.7 C) 98.3 F (36.8 C)   TempSrc: Oral Oral Oral   SpO2: 98% 100% 100%   Weight:      Height:        Intake/Output Summary (Last  24 hours) at 04/26/17 0933 Last data filed at 04/26/17 0528  Gross per 24 hour  Intake             1200 ml  Output             1300 ml  Net             -100 ml   Filed Weights   04/23/17 0100 04/24/17 0400 04/25/17 0700  Weight: 91.4 kg (201 lb 8 oz) 93 kg (205 lb 0.4 oz) 93.6 kg (206 lb 5.6 oz)    Telemetry    Currently sinus rhythm.  Overnight atrial fibrillation.  Rates better-controlled.   - Personally Reviewed  ECG    n/a - Personally Reviewed  Physical Exam   VS:  BP (!) 109/56   Pulse 80   Temp 98.3 F (36.8 C) (Oral)   Resp 18   Ht 5\' 7"  (1.702 m)   Wt 93.6 kg (206 lb 5.6 oz)   SpO2 100%   BMI 32.32 kg/m  , BMI Body mass index is 32.32 kg/m. GENERAL:  Chronically ill-appearing.  Mild respiratory distress.   HEENT: Pupils equal round and reactive, fundi not visualized, oral mucosa unremarkable NECK:  No jugular venous distention, waveform within normal limits, carotid upstroke brisk and symmetric, no bruits LUNGS:  Clear to auscultation bilaterally HEART:  RRR.  PMI not displaced or sustained,S1 and  S2 within normal limits, no S3, no S4, no clicks, no rubs, no murmurs ABD:  Flat, positive bowel sounds normal in frequency in pitch, no bruits, no rebound, no guarding, no midline pulsatile mass, no hepatomegaly, no splenomegaly EXT:  2 plus pulses throughout, no edema, no cyanosis no clubbing SKIN:  No rashes no nodules NEURO:  Cranial nerves II through XII grossly intact, motor grossly intact throughout [facility]:  Cognitively intact, oriented to person place and time  Labs    Chemistry Recent Labs Lab 04/25/17 0423 04/25/17 1100 04/26/17 0254  NA 133* 133* 131*  K 3.6 3.4* 4.2  CL 93* 95* 95*  CO2 30 29 28   GLUCOSE 91 141* 124*  BUN 17 18 15   CREATININE 0.83 0.97 0.72  CALCIUM 9.5 9.6 9.5  PROT  --  6.8  --   ALBUMIN  --  2.3*  --   AST  --  83*  --   ALT  --  75*  --   ALKPHOS  --  87  --   BILITOT  --  0.6  --   GFRNONAA >60 >60 >60  GFRAA >60  >60 >60  ANIONGAP 10 9 8      Hematology Recent Labs Lab 04/23/17 0731 04/24/17 0443 04/25/17 0423  WBC 14.4* 14.9* 14.3*  RBC 3.19* 3.26* 3.33*  HGB 8.7* 9.0* 9.2*  HCT 27.6* 28.2* 28.8*  MCV 86.5 86.5 86.5  MCH 27.3 27.6 27.6  MCHC 31.5 31.9 31.9  RDW 18.5* 18.6* 18.4*  PLT 427* 462* 477*    Cardiac EnzymesNo results for input(s): TROPONINI in the last 168 hours. No results for input(s): TROPIPOC in the last 168 hours.   BNPNo results for input(s): BNP, PROBNP in the last 168 hours.   DDimer No results for input(s): DDIMER in the last 168 hours.   Radiology    No results found.  Cardiac Studies   Echo 02/12/17:  Study Conclusions  - Left ventricle: The cavity size was severely dilated. Wall   thickness was normal. Systolic function was severely reduced. The   estimated ejection fraction was in the range of 25% to 30%.   Diffuse hypokinesis. The study is not technically sufficient to   allow evaluation of LV diastolic function. - Mitral valve: There was mild regurgitation. - Left atrium: The atrium was moderately dilated. - Right atrium: The atrium was mildly dilated.   Patient Profile      Cindy Bradshaw is a 9F with metastatic breast cancer, asthma, pulmonary embolism and extensive pulmonary metastases here with atrial fibrillation with RVR and acute on chronic systolic and diastolic heart failure.  Assessment & Plan    # Paroxysmal atrial fibrillation: Cindy Bradshaw is back in sinus rhythm this morning. She was started on digoxin yesterday and will start once daily dosing. She will need a level checked on Monday morning prior to taking digoxin.  Continue amiodarone 400 mg twice daily through 8/26.  Then she will take 200 mg daily.  Continue digoxin 0.375mg  daily, metoprolol 12.5mg  daily and Lovenox.   # Acute on chronic systolic and diastolic heart failure:  LVEF 25-30%.  Thought to be chemotherapy +/- tachycardia induced.  Rate/rhythm control as above.  He continues  to maintain a euvolemic fluid balance on Lasix 120 mg by mouth daily.  Renal function is stable.  She is net -6.4 L since admission.  # Metastatic breast cancer: Started chemotherapy.  Prognosis poor.   Signed, Skeet Latch, MD  04/26/2017, 9:33 AM

## 2017-04-26 NOTE — Progress Notes (Signed)
The patient is approved for drug assistance by [employer] for [employer]. Coverage is from 04/25/17 - 04/25/18. Enrollment is based on Off-Label use.

## 2017-04-26 NOTE — Progress Notes (Signed)
Physical Therapy Treatment Patient Details Name: Cindy Bradshaw MRN: [id number] DOB: 08/23/1971 Today's Date: 04/26/2017    History of Present Illness 46 yo female admitted with dyspnea, CHF, A fib. Hx of Afib, CHF, stage IV breast cancer with mets to lungs, PE, EJ 25%    PT Comments    Assisted OOB to amb 2 short distances 15 feet with one sitting rest break on 2 lts nasal.  Pt requires increased time and rest breaks.    Follow Up Recommendations  Home health PT;Supervision/Assistance - 24 hour     Equipment Recommendations  None recommended by PT    Recommendations for Other Services       Precautions / Restrictions Precautions Precautions: Fall Precaution Comments: O2 dep/CHEMO Restrictions Weight Bearing Restrictions: No    Mobility  Bed Mobility Overal bed mobility: Needs Assistance Bed Mobility: Supine to Sit     Supine to sit: Min guard     General bed mobility comments: close guard for safety. increased time.  Elevated HOB and use of rail  Transfers Overall transfer level: Needs assistance Equipment used: Rolling walker (2 wheeled);None Transfers: Sit to/from Stand Sit to Stand: Min guard;Min assist         General transfer comment: Small amount of assist to rise. Increased time. VCs hand placement  Ambulation/Gait Ambulation/Gait assistance: Min guard;Min assist Ambulation Distance (Feet): 50 Feet (25 feet x 2 ) Assistive device: Rolling walker (2 wheeled) Gait Pattern/deviations: Step-through pattern;Decreased stride length;Trunk flexed Gait velocity: decreased   General Gait Details: slow but steady gait remained on 2 lts O2.  Tolerated 15 feet then required a sitting rest break.  DOE 3/4.  Fatigues quickly.    Stairs            Wheelchair Mobility    Modified Rankin (Stroke Patients Only)       Balance                                            Cognition Arousal/Alertness: Awake/alert Behavior During  Therapy: WFL for tasks assessed/performed Overall Cognitive Status: Within Functional Limits for tasks assessed                                        Exercises      General Comments        Pertinent Vitals/Pain Pain Assessment: Faces Faces Pain Scale: Hurts even more Pain Location: R UQ ABD Pain Descriptors / Indicators: Grimacing;Crying Pain Intervention(s): Monitored during session;Patient requesting pain meds-RN notified    Home Living                      Prior Function            PT Goals (current goals can now be found in the care plan section) Progress towards PT goals: Progressing toward goals    Frequency    Min 3X/week      PT Plan Current plan remains appropriate    Co-evaluation              AM-PAC PT "6 Clicks" Daily Activity  Outcome Measure  Difficulty turning over in bed (including adjusting bedclothes, sheets and blankets)?: A Lot Difficulty moving from lying on back to sitting on the side of the bed? :  A Lot Difficulty sitting down on and standing up from a chair with arms (e.g., wheelchair, bedside commode, etc,.)?: A Lot Help needed moving to and from a bed to chair (including a wheelchair)?: A Little Help needed walking in hospital room?: A Little Help needed climbing 3-5 steps with a railing? : A Lot 6 Click Score: 14    End of Session Equipment Utilized During Treatment: Oxygen Activity Tolerance: Patient limited by fatigue;Patient limited by pain Patient left: in chair;with call bell/phone within reach Nurse Communication: Patient requests pain meds PT Visit Diagnosis: Muscle weakness (generalized) (M62.81);Difficulty in walking, not elsewhere classified (R26.2)     Time: 4132-4401 PT Time Calculation (min) (ACUTE ONLY): 26 min  Charges:  $Gait Training: 8-22 mins $Therapeutic Activity: 8-22 mins                    G Codes:       Rica Koyanagi  PTA [facility]  Acute  Rehab Pager      [phone]

## 2017-04-26 NOTE — Plan of Care (Signed)
Problem: Bowel/Gastric: Goal: Will not experience complications related to bowel motility Outcome: Completed/Met Date Met: 04/26/17 Patient reports dowel movements as normal. Last 8/24

## 2017-04-26 NOTE — Progress Notes (Signed)
9:54 AM       I have ordered a trough lanoxin level for Monday AM. If the pt is discharged over the weekend she will need to have this arranged as an out patient lab draw prior to taking her morning Lanoxin. I have contacted the CHF clinic to arrange for an OP visit next week.  Kerin Ransom PA-C [phone] 04/26/2017 9:56 AM

## 2017-04-26 NOTE — Progress Notes (Signed)
PMT progress note  Resting in bed BP (!) 109/56   Pulse 80   Temp 98.3 F (36.8 C) (Oral)   Resp 18   Ht 5\' 7"  (1.702 m)   Wt 93.6 kg (206 lb 5.6 oz)   SpO2 100%   BMI 32.32 kg/m  Labs and imaging noted Cardiology also following, titrating medications for PAF and CHF.  Pain reasonably well controlled Started on immunotherapy Keytruda on 04-25-17. NAD Flat affect Regular breathing S1 S2 Trace edema  Agree with current pain regimen, no changes at this time.  15 minutes spent [facility] palliative medicine team [phone]

## 2017-04-26 NOTE — Progress Notes (Signed)
PROGRESS NOTE [employer] K. [name]   PPI:[id number] DOB: [birth year]  DOA: 04/11/2017 PCP: [name], MD   Brief Narrative:  Cindy Bradshaw is a 46 y.o.femalewith medical history significant of metastatic breast cancer with extensive pulmonary metastases, H/o PE on full dose lovenox, nonischemic cardiomyopathy ejection fraction of 25%, paroxysmal atrial fibrillation, asthma, presented to the ED with worsening shortness of breath, pt reports that this is gradually progressive over 1-2weeks, she was just started on O2 at 2L at her last visit with [name] and given reduced dose [place](chemo). CT chest with worsening pulm mets and small effusions. Patient admitted with acute hypoxic respiratory failure, thought to be multifactorial, asthma flare, PNA, pulmonary mets and heart failure exacerbation. She finished course of antibiotics, and is on prednisone taper. She was treated with IV lasix and amiodarone for HF. Hospital course has been complicated with dyspnea and A. fib with RVR, cardiology was consulted and she was considered for cardioversion but she has been with intermittent normal sinus rhythm, so cardioversion was cancelled. Patient also was on cardiogenic showed, for which she was on IV pressors transiently. This point patient has been diuresing well and her heart rate is well controlled on amiodarone. Patient has opted to continue full scope of treatment and oncology so she was offered immunotherapy. Now dealing with pain control and diuresis   Subjective: Patient seen and examined, she is resting comfortable. Feels somewhat improved from yesterday. Pain currently well-controlled. Off oxygen reports no issues with breathing today. She is back to normal sinus rhythm  Assessment & Plan: Acute hypoxic respiratory failure. Improved Multifactorial, secondary to progression of her pulmonary metastases disease, CHF and asthma. She was treated with Solu-Medrol now  swicthed to prednisone taper. CTA of the chest shows progression of pulmonary metastasis and small pleural effusion Appreciate cardiology recommendations, she will continue on Lasix 120 mg twice a day, good urine output O2 supplementation to keep saturations above 89%  Acute on chronic systolic CHF/Nonischemic cardiomyopathy Ejection fraction 25% Cardiology felt that is related to adriamycin +/- tachycardia She continues on oral Lasix 120 mg daily, renal function remains stable and she continued with good urine output. Balance continues to be negative the hospital stay ~ -7.5L Coreg was decreased to 12.5 to allow better pain control Cardiology start patient on digoxin, will need levels monitoring on Monday Will monitor patient overnight, if remains stable will d/c in AM   Metastatic breast cancer with extensive pulmonary metastases Poor prognosis Palliative care input appreciated We'll continue treatment per oncology - received Keytruda on 04/25/17 Continue current pain meds management - tolerating well   H/o PE Stable On full dose Lovenox  P.Afib Continues with intermittent RVR, but asymptomatic  Patient amiodarone and beta blocker (coreg) Maybe metoprolol would be more beneficial in term of rate control, I will defer to cardiology  No cardioversion indicated per cardiology recommendation Continue telemetry monitoring Anticoagulation with Lovenox  Chronic pain of malignancy Adjusting pain management with palliative care   Leukocytosis -  she completed course of antibiotic, chest x-ray is stable, prednisone has been tapering down.  DVT prophylaxis: Lovenox  Code Status: FULL  Family Communication: None at bedside  Disposition Plan: Home in AM if stable    Consultants:   Cardiology   Oncology   Palliative Care   Procedures:     Antimicrobials: Anti-infectives    Start     Dose/Rate Route Frequency Ordered Stop   04/13/17 1700  cefUROXime (CEFTIN) tablet 250  mg  Status:  Discontinued     250 mg Oral 2 times daily with meals 04/13/17 1119 04/18/17 1326   04/11/17 2000  cefTRIAXone (ROCEPHIN) 1 g in dextrose 5 % 50 mL IVPB  Status:  Discontinued     1 g 100 mL/hr over 30 Minutes Intravenous Every 24 hours 04/11/17 1831 04/13/17 1119   04/11/17 2000  azithromycin (ZITHROMAX) tablet 500 mg  Status:  Discontinued     500 mg Oral Daily 04/11/17 1831 04/13/17 1119       Objective: Vitals:   04/25/17 0839 04/25/17 1412 04/25/17 2123 04/26/17 0526  BP: (!) 107/58 96/82 108/77 (!) 103/59  Pulse: 78  93 81  Resp: (!) 22 (!) 22 18 18   Temp: 98.7 F (37.1 C) 98.7 F (37.1 C) 98.1 F (36.7 C) 98.3 F (36.8 C)  TempSrc: Oral Oral Oral Oral  SpO2: 99% 98% 100% 100%  Weight:      Height:        Intake/Output Summary (Last 24 hours) at 04/26/17 0847 Last data filed at 04/26/17 4034  Gross per 24 hour  Intake             1200 ml  Output             1300 ml  Net             -100 ml   Filed Weights   04/23/17 0100 04/24/17 0400 04/25/17 0700  Weight: 91.4 kg (201 lb 8 oz) 93 kg (205 lb 0.4 oz) 93.6 kg (206 lb 5.6 oz)    Examination:  General: NAD Cardiovascular: RRR, S1/S2 +, no rubs, no gallops Respiratory: Decrease breath sounds b/l  Abdominal: Soft, NT, ND, bowel sounds + Extremities: no edema, no cyanosis Psych: Flat affect   Data Reviewed: I have personally reviewed following labs and imaging studies  CBC:  Recent Labs Lab 04/21/17 0500 04/22/17 0649 04/23/17 0731 04/24/17 0443 04/25/17 0423  WBC 12.1* 15.8* 14.4* 14.9* 14.3*  NEUTROABS  --   --   --   --  11.1*  HGB 8.8* 9.2* 8.7* 9.0* 9.2*  HCT 27.2* 28.6* 27.6* 28.2* 28.8*  MCV 86.6 86.4 86.5 86.5 86.5  PLT 528* 495* 427* 462* 742*   Basic Metabolic Panel:  Recent Labs Lab 04/23/17 0731 04/24/17 0443 04/25/17 0423 04/25/17 1100 04/26/17 0254  NA 134* 132* 133* 133* 131*  K 3.7 3.4* 3.6 3.4* 4.2  CL 97* 95* 93* 95* 95*  CO2 29 29 30 29 28   GLUCOSE 98 103*  91 141* 124*  BUN 25* 18 17 18 15   CREATININE 1.09* 0.92 0.83 0.97 0.72  CALCIUM 9.6 9.4 9.5 9.6 9.5   GFR: Estimated Creatinine Clearance: 102.1 mL/min (by C-G formula based on SCr of 0.72 mg/dL).  CBG:  Recent Labs Lab 04/21/17 2124 04/22/17 0747 04/22/17 1257 04/22/17 2314 04/23/17 0741  GLUCAP 105* 94 122* 106* 92    No results found for this or any previous visit (from the past 240 hour(s)).    Radiology Studies: No results found.   Scheduled Meds: . amiodarone  400 mg Oral BID  . benzonatate  100 mg Oral TID  . Chlorhexidine Gluconate Cloth  6 each Topical Q0600  . [START ON 04/27/2017] digoxin  0.375 mg Oral Daily  . enoxaparin  1 mg/kg Subcutaneous BID  . fentaNYL  50 mcg Transdermal Q72H  . ferrous sulfate  325 mg Oral TID WC  . furosemide  120 mg Oral BID  .  lactulose  20 g Oral BID  . metoprolol succinate  12.5 mg Oral Daily  . multivitamin with minerals  1 tablet Oral Daily  . pantoprazole  40 mg Oral Q1200  . polyethylene glycol  17 g Oral BID  . potassium chloride  60 mEq Oral BID  . predniSONE  20 mg Oral Q breakfast  . protein supplement shake  11 oz Oral BID BM  . senna  2 tablet Oral Daily  . sodium chloride flush  10-40 mL Intracatheter Q12H   Continuous Infusions: . sodium chloride    . famotidine       LOS: 15 days    Time spent: Total of 15 minutes spent with pt, greater than 50% of which was spent in discussion of  treatment, counseling and coordination of care    Chipper Oman, MD Pager: Text Page via [email or web address]   If 7PM-7AM, please contact night-coverage [email or web address] 04/26/2017, 8:47 AM

## 2017-04-27 LAB — BASIC METABOLIC PANEL
Anion gap: 9 (ref 5–15)
BUN: 16 mg/dL (ref 6–20)
CALCIUM: 10.2 mg/dL (ref 8.9–10.3)
CO2: 27 mmol/L (ref 22–32)
CREATININE: 0.75 mg/dL (ref 0.44–1.00)
Chloride: 96 mmol/L — ABNORMAL LOW (ref 101–111)
GFR calc Af Amer: >60 mL/min (ref >60)
GLUCOSE: 101 mg/dL — AB (ref 65–99)
Potassium: 4.9 mmol/L (ref 3.5–5.1)
SODIUM: 132 mmol/L — AB (ref 135–145)

## 2017-04-27 MED ORDER — LACTULOSE 10 GM/15ML PO SOLN: 20.0000 g | Freq: Two times a day (BID) | ORAL | 0 refills | Status: AC | PRN

## 2017-04-27 MED ORDER — AMIODARONE HCL 200 MG PO TABS: 200.0000 mg | ORAL_TABLET | Freq: Every day | ORAL | 0 refills | Status: AC

## 2017-04-27 MED ORDER — POTASSIUM CHLORIDE CRYS ER 20 MEQ PO TBCR: 40.0000 meq | EXTENDED_RELEASE_TABLET | Freq: Every day | ORAL | 0 refills | Status: AC

## 2017-04-27 MED ORDER — METHOCARBAMOL 500 MG PO TABS: 500.0000 mg | ORAL_TABLET | Freq: Four times a day (QID) | ORAL | 0 refills | Status: AC | PRN

## 2017-04-27 MED ORDER — DIGOXIN 187.5 MCG PO TABS: 0.3750 mg | ORAL_TABLET | Freq: Every day | ORAL | 0 refills | Status: DC

## 2017-04-27 MED ORDER — PREDNISONE 5 MG PO TABS: ORAL_TABLET | ORAL | 0 refills | Status: DC

## 2017-04-27 MED ORDER — METOPROLOL SUCCINATE ER 25 MG PO TB24: 12.5000 mg | ORAL_TABLET | Freq: Every day | ORAL | 0 refills | Status: AC

## 2017-04-27 MED ORDER — ONDANSETRON HCL 8 MG PO TABS: 8.0000 mg | ORAL_TABLET | Freq: Three times a day (TID) | ORAL | 1 refills | Status: AC | PRN

## 2017-04-27 MED ORDER — FUROSEMIDE 40 MG PO TABS: 120.0000 mg | ORAL_TABLET | Freq: Two times a day (BID) | ORAL | 0 refills | Status: DC

## 2017-04-27 MED ORDER — HEPARIN SOD (PORK) LOCK FLUSH 100 UNIT/ML IV SOLN
500.0000 [IU] | INTRAVENOUS | Status: AC | PRN
  Administered 2017-04-27: 500 [IU]

## 2017-04-27 MED ORDER — FENTANYL 50 MCG/HR TD PT72: 50.0000 ug | MEDICATED_PATCH | TRANSDERMAL | 0 refills | Status: DC

## 2017-04-27 MED ORDER — MORPHINE SULFATE 30 MG PO TABS: 30.0000 mg | ORAL_TABLET | ORAL | 0 refills | Status: DC | PRN

## 2017-04-27 MED ORDER — POLYETHYLENE GLYCOL 3350 17 G PO PACK: 17.0000 g | PACK | Freq: Two times a day (BID) | ORAL | 0 refills | Status: AC

## 2017-04-27 NOTE — Discharge Summary (Signed)
Physician Discharge Summary  Cindy Bradshaw  XLK:[id number]  DOB: 01-31-1971  DOA: 04/11/2017 PCP: [name], MD  Admit date: 04/11/2017 Discharge date: 04/27/2017  Admitted From: Home  Disposition:  Home   Recommendations for Outpatient Follow-up:  1. Follow up with PCP on Monday for digoxin levels  2. Please obtain BMP/CBC in one week to monitor renal function and Hgb  3. Needs to follow up with CHF clinic and cardiology  4. Follow up with oncology   Home Health: PT/OT/Aide  Equipment/Devices: O2 2L PRN   Discharge Condition: Guarded, poor prognosis  CODE STATUS: FULL  Diet recommendation: Heart Healthy   Brief/Interim Summary: Fur full details see H&P and progress notes but in brief. Cindy Bradshaw is a 46 y.o.femalewith medical history significant of metastatic breast cancer with extensive pulmonary metastases, H/o PE on full dose lovenox, nonischemic cardiomyopathy ejection fraction of 25%, paroxysmal atrial fibrillation, asthma, presented to the ED with worsening shortness of breath, pt reports that this is gradually progressive over 1-2weeks, she was just started on O2 at 2L at her last visit with [name] and given reduced dose [place](chemo). CT chest with worsening pulm mets and small effusions. Patient was admitted with acute hypoxic respiratory failure, thought to be multifactorial, asthma flare, PNA, pulmonary mets and heart failure exacerbation. She finished course of antibiotics, and is on prednisone taper. She was treated with IV lasix and amiodarone for HF. Hospital course has been complicated with dyspnea and A. fib with RVR, cardiology was consulted and she was considered for cardioversion but she has been with intermittent normal sinus rhythm, so cardioversion was cancelled. Patient also was on cardiogenic shock, for which she was on IV pressors transiently. Cardiology was consulted and medication where adjusted for CHF and Afib. Palliative care was consulted  and patient has opted to continue full scope of treatment and oncology so she was offered immunotherapy. First dose of Keytruda was completed during hospital stay. Patient has achieve fair pain control, breathing is back to baseline and HR is well controlled. Patient was evaluated by PT who recommended SNT for STR but she decline, so she will be discharge home with [facility] PT/OT/Aide.   Subjective: Patient seen and examined on the day of discharge, she reports doing much better. Breathing back to baseline, using oxygen on and off, pain is fairly controlled on current medications. Patient denies chest pain, palpitations, dizziness, abdominal pain, nausea and vomiting. Patient remains afebrile, with good urine output and tolerating diet well  Discharge Diagnoses/Hospital Course:  Acute hypoxic respiratory failure. Improved Multifactorial, secondary to progression of her pulmonary metastases disease, CHF and asthma. She was treated with Solu-Medrol then swicthed to prednisone taper which will continue for 14 days CTA of the chest shows progression of pulmonary metastasis and small pleural effusion She was diuresed with IV Lasix subsequently change to oral formulation with good urine output. O2 supplementation as needed to keep saturations above 89% Follow-up with PCP  Acute on chronic systolic CHF/Nonischemic cardiomyopathy Ejection fraction 25% Cardiology felt that is related to adriamycin +/- tachycardia She initially was diuresed with IV Lasix subsequently change to oral Lasix 120 mg daily, renal function remains stable and she continued with good urine output. Balance continues to be negative during the hospital stay ~ -8L Metoprolol was decreased to 12.5 to allow better pain control Cardiology started patient on digoxin, will need levels monitoring on Monday.   Metastatic breast cancer with extensive pulmonary metastases Poor prognosis Palliative care input appreciated - she wants  to continue  full scope of treatment  We'll continue treatment per oncology - received Keytruda on 04/25/17 Pain management as prescribed - Fentanyl was increased to 50 mcg, for breakthrough morphine 30 mg q4 PRN    H/o PE Stable On full dose Lovenox  P.Afib Continues with intermittent RVR, but asymptomatic  Patient amiodarone and beta blocker  No cardioversion indicated per cardiology recommendation Anticoagulation with Lovenox  Chronic pain of malignancy Pain control as above   Leukocytosis -  she completed course of antibiotic, chest x-ray is stable, prednisone has been tapering down.  All other chronic medical condition were stable during the hospitalization.  Patient was seen by physical therapy,recommended SNT for STR but she decline, so she will be discharge home with [facility] PT/OT/Aide.  On the day of the discharge the patient's vitals were stable, and no other acute medical condition were reported by patient. Patient was felt safe to be discharge to home  Discharge Instructions  You were cared for by a hospitalist during your hospital stay. If you have any questions about your discharge medications or the care you received while you were in the hospital after you are discharged, you can call the unit and asked to speak with the hospitalist on call if the hospitalist that took care of you is not available. Once you are discharged, your primary care physician will handle any further medical issues. Please note that NO REFILLS for any discharge medications will be authorized once you are discharged, as it is imperative that you return to your primary care physician (or establish a relationship with a primary care physician if you do not have one) for your aftercare needs so that they can reassess your need for medications and monitor your lab values.  Discharge Instructions    Call MD for:  difficulty breathing, headache or visual disturbances    Complete by:  As directed    Call MD for:   extreme fatigue    Complete by:  As directed    Call MD for:  hives    Complete by:  As directed    Call MD for:  persistant dizziness or light-headedness    Complete by:  As directed    Call MD for:  persistant nausea and vomiting    Complete by:  As directed    Call MD for:  redness, tenderness, or signs of infection (pain, swelling, redness, odor or green/yellow discharge around incision site)    Complete by:  As directed    Call MD for:  severe uncontrolled pain    Complete by:  As directed    Call MD for:  temperature >100.4    Complete by:  As directed    Diet - low sodium heart healthy    Complete by:  As directed    Discharge instructions    Complete by:  As directed    Follow up with primary care physician on 04/29/17 to check digoxin levels   Increase activity slowly    Complete by:  As directed      Allergies as of 04/27/2017      Reactions   Dilaudid [hydromorphone Hcl] Itching   Ivp Dye [iodinated Diagnostic Agents] Itching   Percocet [oxycodone-acetaminophen] Itching   Tape Itching, Rash   Hypofix causes rash   Toradol [ketorolac Tromethamine] Itching      Medication List    STOP taking these medications   dexamethasone 4 MG tablet Commonly known as:  DECADRON   fentaNYL 12  MCG/HR Commonly known as:  DURAGESIC - dosed mcg/hr Replaced by:  fentaNYL 50 MCG/HR   losartan 50 MG tablet Commonly known as:  COZAAR   tobramycin-dexamethasone ophthalmic solution Commonly known as:  TOBRADEX     TAKE these medications   albuterol (2.5 MG/3ML) 0.083% nebulizer solution Commonly known as:  PROVENTIL Take 2.5 mg by nebulization every 6 (six) hours as needed for wheezing or shortness of breath.   albuterol 108 (90 Base) MCG/ACT inhaler Commonly known as:  PROVENTIL HFA;VENTOLIN HFA Inhale 1-2 puffs into the lungs every 6 (six) hours as needed for wheezing or shortness of breath.   amiodarone 200 MG tablet Commonly known as:  PACERONE Take 1 tablet (200 mg  total) by mouth daily. What changed:  when to take this  additional instructions   cholecalciferol 1000 units tablet Commonly known as:  VITAMIN D Take 1,000 Units by mouth daily.   Digoxin 187.5 MCG Tabs Take 0.375 mg by mouth daily.   enoxaparin 100 MG/ML injection Commonly known as:  LOVENOX Inject 1 mL (100 mg total) into the skin every 12 (twelve) hours.   fentaNYL 50 MCG/HR Commonly known as:  DURAGESIC - dosed mcg/hr Place 1 patch (50 mcg total) onto the skin every 3 (three) days. Replaces:  fentaNYL 12 MCG/HR   ferrous sulfate 325 (65 FE) MG EC tablet Take 325 mg by mouth 3 (three) times daily with meals.   furosemide 40 MG tablet Commonly known as:  LASIX Take 3 tablets (120 mg total) by mouth 2 (two) times daily. What changed:  how much to take  when to take this   guaiFENesin-dextromethorphan 100-10 MG/5ML syrup Commonly known as:  ROBITUSSIN DM Take 5 mLs by mouth every 4 (four) hours as needed for cough.   lactulose 10 GM/15ML solution Commonly known as:  CHRONULAC Take 30 mLs (20 g total) by mouth 2 (two) times daily as needed for mild constipation or moderate constipation.   Melatonin 10 MG Tabs Take 10 mg by mouth at bedtime.   methocarbamol 500 MG tablet Commonly known as:  ROBAXIN Take 1 tablet (500 mg total) by mouth every 6 (six) hours as needed for muscle spasms.   metoprolol succinate 25 MG 24 hr tablet Commonly known as:  TOPROL-XL Take 0.5 tablets (12.5 mg total) by mouth daily. What changed:  medication strength  how much to take  additional instructions   morphine 30 MG tablet Commonly known as:  MSIR Take 1 tablet (30 mg total) by mouth every 4 (four) hours as needed for moderate pain or severe pain. What changed:  reasons to take this   ondansetron 8 MG tablet Commonly known as:  ZOFRAN Take 1 tablet (8 mg total) by mouth every 8 (eight) hours as needed (Nausea or vomiting). What changed:  when to take this    polyethylene glycol packet Commonly known as:  MIRALAX / GLYCOLAX Take 17 g by mouth 2 (two) times daily.   potassium chloride SA 20 MEQ tablet Commonly known as:  K-DUR,KLOR-CON Take 2 tablets (40 mEq total) by mouth daily. What changed:  how much to take   predniSONE 5 MG tablet Commonly known as:  DELTASONE Take 2 tablet daily for 5 days, then take 1 tablet daily for 5 days, then take half a tablet daily for 7 days   senna 8.6 MG Tabs tablet Commonly known as:  SENOKOT Take 1 tablet by mouth daily as needed for mild constipation.  Durable Medical Equipment        Start     Ordered   04/27/17 1319  DME Oxygen  Once    Question Answer Comment  Mode or (Route) Nasal cannula   Liters per Minute 2   Frequency Only at night (stationary unit needed)   Oxygen delivery system Gas      04/27/17 1320       Discharge Care Instructions        Start     Ordered   04/28/17 0000  metoprolol succinate (TOPROL-XL) 25 MG 24 hr tablet  Daily     04/27/17 1320   04/28/17 0000  digoxin 187.5 MCG TABS  Daily     04/27/17 1320   04/27/17 0000  amiodarone (PACERONE) 200 MG tablet  Daily     04/27/17 1320   04/27/17 0000  fentaNYL (DURAGESIC - DOSED MCG/HR) 50 MCG/HR  every 72 hours     04/27/17 1320   04/27/17 0000  furosemide (LASIX) 40 MG tablet  2 times daily     04/27/17 1320   04/27/17 0000  morphine (MSIR) 30 MG tablet  Every 4 hours PRN     04/27/17 1320   04/27/17 0000  ondansetron (ZOFRAN) 8 MG tablet  Every 8 hours PRN     04/27/17 1320   04/27/17 0000  potassium chloride SA (K-DUR,KLOR-CON) 20 MEQ tablet  Daily     04/27/17 1320   04/27/17 0000  methocarbamol (ROBAXIN) 500 MG tablet  Every 6 hours PRN     04/27/17 1320   04/27/17 0000  lactulose (CHRONULAC) 10 GM/15ML solution  2 times daily PRN     04/27/17 1320   04/27/17 0000  polyethylene glycol (MIRALAX / GLYCOLAX) packet  2 times daily     04/27/17 1320   04/27/17 0000  predniSONE (DELTASONE) 5  MG tablet     04/27/17 1320   04/27/17 0000  Increase activity slowly     04/27/17 1320   04/27/17 0000  Diet - low sodium heart healthy     04/27/17 1320   04/27/17 0000  Call MD for:  temperature >100.4     04/27/17 1320   04/27/17 0000  Call MD for:  persistant nausea and vomiting     04/27/17 1320   04/27/17 0000  Call MD for:  severe uncontrolled pain     04/27/17 1320   04/27/17 0000  Call MD for:  redness, tenderness, or signs of infection (pain, swelling, redness, odor or green/yellow discharge around incision site)     04/27/17 1320   04/27/17 0000  Call MD for:  difficulty breathing, headache or visual disturbances     04/27/17 1320   04/27/17 0000  Call MD for:  hives     04/27/17 1320   04/27/17 0000  Call MD for:  persistant dizziness or light-headedness     04/27/17 1320   04/27/17 0000  Call MD for:  extreme fatigue     04/27/17 1320   04/27/17 0000  Discharge instructions    Comments:  Follow up with primary care physician on 04/29/17 to check digoxin levels   04/27/17 1320     Follow-up Information    [facility] up.   Why:  [facility] physical therapy Contact information: [address] [place] [address] [phone]        Duanne Limerick, Md, MD Follow up.   Specialty:  Cardiology Why:  Office will contact you for  an apointment       [name], MD. Daphane Shepherd on 04/29/2017.   Specialty:  Internal Medicine Why:  Request digoxin levels blood draw Contact information: [address] [place] [address] [phone]          Allergies  Allergen Reactions  . Dilaudid [Hydromorphone Hcl] Itching  . Ivp Dye [Iodinated Diagnostic Agents] Itching  . Percocet [Oxycodone-Acetaminophen] Itching  . Tape Itching and Rash    Hypofix causes rash  . Toradol [Ketorolac Tromethamine] Itching    Consultations:  Oncology  Cardiology   Procedures/Studies: Dg Chest 2 View  Result Date: 04/17/2017 CLINICAL DATA:   History of metastatic breast cancer. Shortness of breath. EXAM: CHEST  2 VIEW COMPARISON:  04/14/2017, CT 04/12/2017 FINDINGS: Cardiomegaly with vascular congestion. Suspect small layering effusions. Bibasilar atelectasis or infiltrates. Nodular airspace opacities compatible with known metastatic disease. No real change since prior study. IMPRESSION: No significant change since prior study. Electronically Signed   By: Rolm Baptise M.D.   On: 04/17/2017 10:30   Dg Abd 1 View  Result Date: 04/20/2017 CLINICAL DATA:  Vomiting for 3 days.  Chemotherapy for breast cancer EXAM: ABDOMEN - 1 VIEW COMPARISON:  Staging CT 01/30/2017 FINDINGS: Diffuse formed stool, confluent throughout the left colon. No obstruction or rectal impaction. No concerning mass effect or calcification. Cholecystectomy clips. IMPRESSION: Moderate to large stool volume without obstruction or rectal impaction. Electronically Signed   By: Monte Fantasia M.D.   On: 04/20/2017 08:32   Ct Head Wo Contrast  Result Date: 04/12/2017 CLINICAL DATA:  History of metastatic breast carcinoma and pulmonary embolus. The patient is on Lovenox. Increasing shortness breath over the past 1-2 weeks. EXAM: CT HEAD WITHOUT CONTRAST TECHNIQUE: Contiguous axial images were obtained from the base of the skull through the vertex without intravenous contrast. COMPARISON:  None. FINDINGS: Brain: Appears normal without hemorrhage, infarct, mass lesion, mass effect, midline shift or abnormal extra-axial fluid collection. No hydrocephalus pneumocephalus. Vascular: Negative. Skull: Intact.  No focal bone lesion is identified. Sinuses/Orbits: Negative. Other: None. IMPRESSION: Negative head CT. Electronically Signed   By: Inge Rise M.D.   On: 04/12/2017 12:32   Ct Angio Chest Pe W Or Wo Contrast  Result Date: 04/12/2017 CLINICAL DATA:  Shortness of Breath and hypoxia EXAM: CT ANGIOGRAPHY CHEST WITH CONTRAST TECHNIQUE: Multidetector CT imaging of the chest was  performed using the standard protocol during bolus administration of intravenous contrast. Multiplanar CT image reconstructions and MIPs were obtained to evaluate the vascular anatomy. CONTRAST:  100 mL Isovue 370. COMPARISON:  02/23/2017 FINDINGS: Cardiovascular: The thoracic aorta shows no significant atherosclerotic calcifications. No aneurysmal dilatation or dissection is identified. The pulmonary artery is well visualized and demonstrates a normal branching pattern. Some attenuation of the branches is noted secondary to hilar adenopathy. No definitive filling defect to suggest pulmonary embolism is identified. The previously seen right-sided pulmonary embolus has resolved in the interval. Mediastinum/Nodes: There again noted changes consistent with the given clinical history of metastatic breast carcinoma with mediastinal and hilar adenopathy identified. The overall appearance is stable when compared with the prior exam. Lungs/Pleura: Moderate right-sided pleural effusion is noted. Tiny left pleural effusion is seen. Lower lobe atelectatic changes are seen. A large infrahilar right middle lobe mass lesion is noted. Scattered pulmonary metastatic lesions are noted throughout both lungs. Some of these have increased in size particularly along the left cardiac border in the lingula on image number 60 of series 7 as well as in the  lower lobe on the same image. Upper Abdomen: No acute abnormality in the upper abdomen noted. Musculoskeletal: Degenerative changes of thoracic spine are noted. No definitive bony metastatic disease is noted. Lesions are again noted in the right breast consistent with the given clinical history P Review of the MIP images confirms the above findings. IMPRESSION: Changes consistent with extensive metastatic disease in the lungs bilaterally as well as the hila and mediastinum. Some slight increase in some of the lung lesions is noted. No evidence of pulmonary emboli. The previously seen  right pulmonary embolus has resolved in the interval. Bilateral pleural effusions right greater than left. Changes consistent with right breast carcinoma Electronically Signed   By: Inez Catalina M.D.   On: 04/12/2017 12:38   Dg Chest Port 1 View  Result Date: 04/22/2017 CLINICAL DATA:  Shortness of breath.  History of leukocytosis. EXAM: PORTABLE CHEST 1 VIEW COMPARISON:  04/17/2017.  CT 04/12/2017 . FINDINGS: PowerPort catheter noted with lead tip projected over right atrium. Stable cardiomegaly stable densities are noted in the lungs consistent with known metastatic disease. Basilar atelectasis. Small right pleural effusion. No pneumothorax. Surgical clips left chest. No acute bony abnormality. IMPRESSION: 1. PowerPort catheter stable position. 2. Stable pulmonary densities noted bilaterally consistent known metastatic disease. Basilar atelectasis. Small right pleural effusion. No significant changes noted from prior exams . Electronically Signed   By: Marcello Moores  Register   On: 04/22/2017 08:53   Dg Chest Port 1 View  Result Date: 04/14/2017 CLINICAL DATA:  Hypoxia today, breast cancer with pulmonary metastases, asthma, atrial fibrillation EXAM: PORTABLE CHEST 1 VIEW COMPARISON:  Portable exam 1414 hours compared 04/11/2017 FINDINGS: RIGHT jugular Port-A-Cath stable tip projecting over cavoatrial junction. Enlargement of cardiac silhouette. Enlarged central pulmonary arteries. Bibasilar pleural effusions and atelectasis. RIGHT upper lobe opacity question metastasis. Probable LEFT lower lobe and LEFT perihilar metastases. No pneumothorax. No definite osseous lesions. IMPRESSION: Pulmonary metastases with bibasilar pleural effusions atelectasis. Worsened bibasilar aeration since previous exam. Electronically Signed   By: Lavonia Dana M.D.   On: 04/14/2017 15:28   Dg Chest Portable 1 View  Result Date: 04/11/2017 CLINICAL DATA:  Shortness of breath for 2 days with cough, history breast cancer post  chemotherapy, history asthma, cardiomyopathy, atrial fibrillation, LEFT lumpectomy with radioactive seed implantation and sentinel node biopsy EXAM: PORTABLE CHEST 1 VIEW COMPARISON:  Portable exam 1428 hours compared to 02/22/2017 FINDINGS: RIGHT jugular Port-A-Cath with tip projecting over SVC. Enlargement of cardiac silhouette. Prominent mediastinum, hila, and AP window identified, corresponding to adenopathy on CT. Multiple pulmonary masses again identified compatible with metastases. Bibasilar atelectasis. No gross pleural effusion or pneumothorax. Osseous structures unremarkable. IMPRESSION: Pulmonary metastases. Mediastinal and hilar adenopathy. Bibasilar atelectasis. Electronically Signed   By: Lavonia Dana M.D.   On: 04/11/2017 14:47      Discharge Exam: Vitals:   04/26/17 2242 04/27/17 0436  BP: 123/72 (!) 120/94  Pulse: 60 98  Resp: 18 18  Temp: 98.4 F (36.9 C) 98.9 F (37.2 C)  SpO2: 98% 100%   Vitals:   04/26/17 0857 04/26/17 1414 04/26/17 2242 04/27/17 0436  BP: (!) 109/56 127/61 123/72 (!) 120/94  Pulse: 80 65 60 98  Resp:  17 18 18   Temp:  98.3 F (36.8 C) 98.4 F (36.9 C) 98.9 F (37.2 C)  TempSrc:  Oral Oral Oral  SpO2:  97% 98% 100%  Weight:    90.9 kg (200 lb 6.4 oz)  Height:        General: Pt  is alert, awake, not in acute distress Cardiovascular: RRR, S1/S2 +, no rubs, no gallops Respiratory: Decreased breath sounds, bilateral rhonchi Abdominal: Soft, NT, ND, bowel sounds + Extremities: no edema, no cyanosis    The results of significant diagnostics from this hospitalization (including imaging, microbiology, ancillary and laboratory) are listed below for reference.     Microbiology: No results found for this or any previous visit (from the past 240 hour(s)).   Labs: BNP (last 3 results)  Recent Labs  09/20/16 1858 02/22/17 2336 04/11/17 1421  BNP 33.9 116.2* 845.3*   Basic Metabolic Panel:  Recent Labs Lab 04/24/17 0443 04/25/17 0423  04/25/17 1100 04/26/17 0254 04/27/17 0411  NA 132* 133* 133* 131* 132*  K 3.4* 3.6 3.4* 4.2 4.9  CL 95* 93* 95* 95* 96*  CO2 29 30 29 28 27   GLUCOSE 103* 91 141* 124* 101*  BUN 18 17 18 15 16   CREATININE 0.92 0.83 0.97 0.72 0.75  CALCIUM 9.4 9.5 9.6 9.5 10.2   Liver Function Tests:  Recent Labs Lab 04/25/17 1100  AST 83*  ALT 75*  ALKPHOS 87  BILITOT 0.6  PROT 6.8  ALBUMIN 2.3*   No results for input(s): LIPASE, AMYLASE in the last 168 hours. No results for input(s): AMMONIA in the last 168 hours. CBC:  Recent Labs Lab 04/21/17 0500 04/22/17 0649 04/23/17 0731 04/24/17 0443 04/25/17 0423  WBC 12.1* 15.8* 14.4* 14.9* 14.3*  NEUTROABS  --   --   --   --  11.1*  HGB 8.8* 9.2* 8.7* 9.0* 9.2*  HCT 27.2* 28.6* 27.6* 28.2* 28.8*  MCV 86.6 86.4 86.5 86.5 86.5  PLT 528* 495* 427* 462* 477*   Cardiac Enzymes: No results for input(s): CKTOTAL, CKMB, CKMBINDEX, TROPONINI in the last 168 hours. BNP: Invalid input(s): POCBNP CBG:  Recent Labs Lab 04/21/17 2124 04/22/17 0747 04/22/17 1257 04/22/17 2314 04/23/17 0741  GLUCAP 105* 94 122* 106* 92   D-Dimer No results for input(s): DDIMER in the last 72 hours. Hgb A1c No results for input(s): HGBA1C in the last 72 hours. Lipid Profile No results for input(s): CHOL, HDL, LDLCALC, TRIG, CHOLHDL, LDLDIRECT in the last 72 hours. Thyroid function studies  Recent Labs  04/26/17 1753  TSH 1.022   Anemia work up No results for input(s): VITAMINB12, FOLATE, FERRITIN, TIBC, IRON, RETICCTPCT in the last 72 hours. Urinalysis    Component Value Date/Time   COLORURINE YELLOW 04/11/2017 Cut Off 04/11/2017 1716   LABSPEC 1.006 04/11/2017 1716   LABSPEC 1.010 04/10/2016 1224   PHURINE 5.0 04/11/2017 1716   GLUCOSEU NEGATIVE 04/11/2017 1716   GLUCOSEU Negative 04/10/2016 1224   HGBUR SMALL (A) 04/11/2017 1716   BILIRUBINUR NEGATIVE 04/11/2017 1716   BILIRUBINUR Negative 04/10/2016 1224   KETONESUR  NEGATIVE 04/11/2017 1716   PROTEINUR NEGATIVE 04/11/2017 1716   UROBILINOGEN 0.2 04/10/2016 1224   NITRITE NEGATIVE 04/11/2017 1716   LEUKOCYTESUR NEGATIVE 04/11/2017 1716   LEUKOCYTESUR Negative 04/10/2016 1224   Sepsis Labs Invalid input(s): PROCALCITONIN,  WBC,  LACTICIDVEN Microbiology No results found for this or any previous visit (from the past 240 hour(s)).   Time coordinating discharge: 35 minutes  SIGNED:  Chipper Oman, MD  [facility] 04/27/2017, 1:45 PM  Pager please text page via  [email or web address] Password [email or web address]

## 2017-04-27 NOTE — Care Management Note (Signed)
Case Management Note  Patient Details  Name: Cindy Bradshaw MRN: [id number] Date of Birth: Jan 27, 1971  Subjective/Objective:                 Patient states she has oxygen through [facility] at home, has portable tank at the bedside that she will need to take home w her, but it it empty so [facility] has been notified she will need another tank for transport. Referral placed to [facility] for [facility] at request of patient    Action/Plan:   Expected Discharge Date:  04/27/17               Expected Discharge Plan:  [place]  In-House Referral:     Discharge planning Services  CM Consult  Post Acute Care Choice:  Durable Medical Equipment [facility] has travel tank) Choice offered to:  Patient  DME Arranged:    DME Agency:     HH Arranged:  [place]:  [facility]  Status of Service:  In process, will continue to follow  If discussed at Long Length of Stay Meetings, dates discussed:    Additional Comments:  Carles Collet, RN 04/27/2017, 2:04 PM

## 2017-05-01 ENCOUNTER — Emergency Department (HOSPITAL_COMMUNITY): Payer: Federal, State, Local not specified - PPO

## 2017-05-01 ENCOUNTER — Encounter (HOSPITAL_COMMUNITY): Payer: Self-pay | Admitting: Emergency Medicine

## 2017-05-01 ENCOUNTER — Inpatient Hospital Stay (HOSPITAL_COMMUNITY)
Admission: EM | Admit: 2017-05-01 | Discharge: 2017-05-04 | DRG: 092 | Disposition: A | Discharge status: Home / Self Care | Payer: Federal, State, Local not specified - PPO | Attending: Internal Medicine | Admitting: Internal Medicine

## 2017-05-01 DIAGNOSIS — Z7901 Long term (current) use of anticoagulants: Secondary | ICD-10-CM

## 2017-05-01 DIAGNOSIS — T460X5A Adverse effect of cardiac-stimulant glycosides and drugs of similar action, initial encounter: Secondary | ICD-10-CM | POA: Diagnosis present

## 2017-05-01 DIAGNOSIS — D638 Anemia in other chronic diseases classified elsewhere: Secondary | ICD-10-CM | POA: Diagnosis present

## 2017-05-01 DIAGNOSIS — D649 Anemia, unspecified: Secondary | ICD-10-CM | POA: Diagnosis not present

## 2017-05-01 DIAGNOSIS — M1990 Unspecified osteoarthritis, unspecified site: Secondary | ICD-10-CM | POA: Diagnosis present

## 2017-05-01 DIAGNOSIS — R2242 Localized swelling, mass and lump, left lower limb: Secondary | ICD-10-CM

## 2017-05-01 DIAGNOSIS — N179 Acute kidney failure, unspecified: Secondary | ICD-10-CM | POA: Diagnosis present

## 2017-05-01 DIAGNOSIS — Z86711 Personal history of pulmonary embolism: Secondary | ICD-10-CM | POA: Diagnosis not present

## 2017-05-01 DIAGNOSIS — Z91041 Radiographic dye allergy status: Secondary | ICD-10-CM | POA: Diagnosis not present

## 2017-05-01 DIAGNOSIS — G620 Drug-induced polyneuropathy: Secondary | ICD-10-CM | POA: Diagnosis not present

## 2017-05-01 DIAGNOSIS — I4891 Unspecified atrial fibrillation: Secondary | ICD-10-CM | POA: Diagnosis present

## 2017-05-01 DIAGNOSIS — D473 Essential (hemorrhagic) thrombocythemia: Secondary | ICD-10-CM | POA: Diagnosis not present

## 2017-05-01 DIAGNOSIS — T451X5A Adverse effect of antineoplastic and immunosuppressive drugs, initial encounter: Secondary | ICD-10-CM | POA: Diagnosis not present

## 2017-05-01 DIAGNOSIS — E871 Hypo-osmolality and hyponatremia: Secondary | ICD-10-CM | POA: Diagnosis present

## 2017-05-01 DIAGNOSIS — Z79899 Other long term (current) drug therapy: Secondary | ICD-10-CM

## 2017-05-01 DIAGNOSIS — J9611 Chronic respiratory failure with hypoxia: Secondary | ICD-10-CM | POA: Diagnosis present

## 2017-05-01 DIAGNOSIS — Z801 Family history of malignant neoplasm of trachea, bronchus and lung: Secondary | ICD-10-CM | POA: Diagnosis not present

## 2017-05-01 DIAGNOSIS — Z885 Allergy status to narcotic agent status: Secondary | ICD-10-CM

## 2017-05-01 DIAGNOSIS — Z8249 Family history of ischemic heart disease and other diseases of the circulatory system: Secondary | ICD-10-CM

## 2017-05-01 DIAGNOSIS — J9610 Chronic respiratory failure, unspecified whether with hypoxia or hypercapnia: Secondary | ICD-10-CM | POA: Diagnosis not present

## 2017-05-01 DIAGNOSIS — Z9221 Personal history of antineoplastic chemotherapy: Secondary | ICD-10-CM

## 2017-05-01 DIAGNOSIS — Z9889 Other specified postprocedural states: Secondary | ICD-10-CM | POA: Diagnosis not present

## 2017-05-01 DIAGNOSIS — Z823 Family history of stroke: Secondary | ICD-10-CM

## 2017-05-01 DIAGNOSIS — I130 Hypertensive heart and chronic kidney disease with heart failure and stage 1 through stage 4 chronic kidney disease, or unspecified chronic kidney disease: Secondary | ICD-10-CM | POA: Diagnosis present

## 2017-05-01 DIAGNOSIS — Z888 Allergy status to other drugs, medicaments and biological substances status: Secondary | ICD-10-CM

## 2017-05-01 DIAGNOSIS — J45909 Unspecified asthma, uncomplicated: Secondary | ICD-10-CM | POA: Diagnosis present

## 2017-05-01 DIAGNOSIS — Z803 Family history of malignant neoplasm of breast: Secondary | ICD-10-CM | POA: Diagnosis not present

## 2017-05-01 DIAGNOSIS — I429 Cardiomyopathy, unspecified: Secondary | ICD-10-CM | POA: Diagnosis present

## 2017-05-01 DIAGNOSIS — D72829 Elevated white blood cell count, unspecified: Secondary | ICD-10-CM | POA: Diagnosis present

## 2017-05-01 DIAGNOSIS — Z808 Family history of malignant neoplasm of other organs or systems: Secondary | ICD-10-CM | POA: Diagnosis not present

## 2017-05-01 DIAGNOSIS — G92 Toxic encephalopathy: Principal | ICD-10-CM | POA: Diagnosis present

## 2017-05-01 DIAGNOSIS — R7989 Other specified abnormal findings of blood chemistry: Secondary | ICD-10-CM | POA: Diagnosis present

## 2017-05-01 DIAGNOSIS — Z9049 Acquired absence of other specified parts of digestive tract: Secondary | ICD-10-CM | POA: Diagnosis not present

## 2017-05-01 DIAGNOSIS — G929 Unspecified toxic encephalopathy: Secondary | ICD-10-CM

## 2017-05-01 DIAGNOSIS — T460X1A Poisoning by cardiac-stimulant glycosides and drugs of similar action, accidental (unintentional), initial encounter: Secondary | ICD-10-CM | POA: Diagnosis not present

## 2017-05-01 DIAGNOSIS — Z825 Family history of asthma and other chronic lower respiratory diseases: Secondary | ICD-10-CM | POA: Diagnosis not present

## 2017-05-01 DIAGNOSIS — D75839 Thrombocytosis, unspecified: Secondary | ICD-10-CM

## 2017-05-01 DIAGNOSIS — I480 Paroxysmal atrial fibrillation: Secondary | ICD-10-CM | POA: Diagnosis present

## 2017-05-01 DIAGNOSIS — T380X5A Adverse effect of glucocorticoids and synthetic analogues, initial encounter: Secondary | ICD-10-CM | POA: Diagnosis present

## 2017-05-01 DIAGNOSIS — C7800 Secondary malignant neoplasm of unspecified lung: Secondary | ICD-10-CM | POA: Diagnosis present

## 2017-05-01 DIAGNOSIS — K219 Gastro-esophageal reflux disease without esophagitis: Secondary | ICD-10-CM | POA: Diagnosis present

## 2017-05-01 DIAGNOSIS — T40605A Adverse effect of unspecified narcotics, initial encounter: Secondary | ICD-10-CM | POA: Diagnosis present

## 2017-05-01 DIAGNOSIS — N183 Chronic kidney disease, stage 3 (moderate): Secondary | ICD-10-CM | POA: Diagnosis present

## 2017-05-01 DIAGNOSIS — I440 Atrioventricular block, first degree: Secondary | ICD-10-CM | POA: Diagnosis present

## 2017-05-01 DIAGNOSIS — T501X5A Adverse effect of loop [high-ceiling] diuretics, initial encounter: Secondary | ICD-10-CM | POA: Diagnosis present

## 2017-05-01 DIAGNOSIS — C50412 Malignant neoplasm of upper-outer quadrant of left female breast: Secondary | ICD-10-CM | POA: Diagnosis present

## 2017-05-01 LAB — CBC WITH DIFFERENTIAL/PLATELET
Basophils Absolute: 0 10*3/uL (ref 0.0–0.1)
Basophils Relative: 0 %
Eosinophils Absolute: 0 10*3/uL (ref 0.0–0.7)
Eosinophils Relative: 0 %
HCT: 27.9 % — ABNORMAL LOW (ref 36.0–46.0)
Hemoglobin: 8.9 g/dL — ABNORMAL LOW (ref 12.0–15.0)
Lymphocytes Relative: 7 %
Lymphs Abs: 0.8 10*3/uL (ref 0.7–4.0)
MCH: 27.5 pg (ref 26.0–34.0)
MCHC: 31.9 g/dL (ref 30.0–36.0)
MCV: 86.1 fL (ref 78.0–100.0)
Monocytes Absolute: 1.2 10*3/uL — ABNORMAL HIGH (ref 0.1–1.0)
Monocytes Relative: 10 %
Neutro Abs: 9.7 10*3/uL — ABNORMAL HIGH (ref 1.7–7.7)
Neutrophils Relative %: 83 %
Platelets: 498 10*3/uL — ABNORMAL HIGH (ref 150–400)
RBC: 3.24 MIL/uL — ABNORMAL LOW (ref 3.87–5.11)
RDW: 18.1 % — ABNORMAL HIGH (ref 11.5–15.5)
WBC: 11.7 10*3/uL — ABNORMAL HIGH (ref 4.0–10.5)

## 2017-05-01 LAB — BASIC METABOLIC PANEL
Anion gap: 14 (ref 5–15)
BUN: 36 mg/dL — ABNORMAL HIGH (ref 6–20)
CO2: 28 mmol/L (ref 22–32)
Calcium: 9.9 mg/dL (ref 8.9–10.3)
Chloride: 88 mmol/L — ABNORMAL LOW (ref 101–111)
Creatinine, Ser: 1.4 mg/dL — ABNORMAL HIGH (ref 0.44–1.00)
GFR calc Af Amer: 51 mL/min — ABNORMAL LOW (ref >60)
GFR calc non Af Amer: 44 mL/min — ABNORMAL LOW (ref >60)
Glucose, Bld: 117 mg/dL — ABNORMAL HIGH (ref 65–99)
Potassium: 4.4 mmol/L (ref 3.5–5.1)
Sodium: 130 mmol/L — ABNORMAL LOW (ref 135–145)

## 2017-05-01 LAB — MAGNESIUM: Magnesium: 1.8 mg/dL (ref 1.7–2.4)

## 2017-05-01 LAB — DIGOXIN LEVEL: Digoxin Level: 3.2 ng/mL (ref 0.8–2.0)

## 2017-05-01 MED ORDER — SODIUM CHLORIDE 0.9% FLUSH
10.0000 mL | INTRAVENOUS | Status: DC | PRN
  Administered 2017-05-02 – 2017-05-04 (×2): 10 mL
  Filled 2017-05-01 (×2): qty 40

## 2017-05-01 MED ORDER — ALBUTEROL SULFATE (2.5 MG/3ML) 0.083% IN NEBU
2.5000 mg | INHALATION_SOLUTION | Freq: Four times a day (QID) | RESPIRATORY_TRACT | Status: DC | PRN
  Administered 2017-05-04: 2.5 mg via RESPIRATORY_TRACT
  Filled 2017-05-01: qty 3

## 2017-05-01 MED ORDER — SENNA 8.6 MG PO TABS: 1.0000 | ORAL_TABLET | Freq: Every day | ORAL | Status: DC | PRN

## 2017-05-01 MED ORDER — ENOXAPARIN SODIUM 100 MG/ML ~~LOC~~ SOLN
1.0000 mg/kg | Freq: Two times a day (BID) | SUBCUTANEOUS | Status: DC
  Administered 2017-05-01 – 2017-05-04 (×6): 85 mg via SUBCUTANEOUS
  Filled 2017-05-01 (×6): qty 1

## 2017-05-01 MED ORDER — LACTULOSE 10 GM/15ML PO SOLN: 20.0000 g | Freq: Two times a day (BID) | ORAL | Status: DC | PRN

## 2017-05-01 MED ORDER — MORPHINE SULFATE 15 MG PO TABS
30.0000 mg | ORAL_TABLET | ORAL | Status: DC | PRN
  Administered 2017-05-01 – 2017-05-04 (×9): 30 mg via ORAL
  Filled 2017-05-01 (×8): qty 2
  Filled 2017-05-01: qty 1

## 2017-05-01 MED ORDER — SODIUM CHLORIDE 0.9 % IV SOLN
Freq: Once | INTRAVENOUS | Status: AC
  Administered 2017-05-01: 12:00:00 via INTRAVENOUS

## 2017-05-01 MED ORDER — METHOCARBAMOL 500 MG PO TABS: 500.0000 mg | ORAL_TABLET | Freq: Four times a day (QID) | ORAL | Status: DC | PRN

## 2017-05-01 MED ORDER — METOPROLOL SUCCINATE ER 25 MG PO TB24
12.5000 mg | ORAL_TABLET | Freq: Every day | ORAL | Status: DC
  Administered 2017-05-01 – 2017-05-03 (×3): 12.5 mg via ORAL
  Filled 2017-05-01 (×3): qty 1

## 2017-05-01 MED ORDER — SODIUM CHLORIDE 0.9% FLUSH
10.0000 mL | Freq: Two times a day (BID) | INTRAVENOUS | Status: DC
  Administered 2017-05-01: 10 mL

## 2017-05-01 MED ORDER — PREDNISONE 5 MG PO TABS
2.5000 mg | ORAL_TABLET | Freq: Every day | ORAL | Status: AC
  Administered 2017-05-02 – 2017-05-03 (×2): 2.5 mg via ORAL
  Filled 2017-05-01 (×2): qty 1

## 2017-05-01 MED ORDER — ONDANSETRON HCL 4 MG PO TABS
4.0000 mg | ORAL_TABLET | Freq: Four times a day (QID) | ORAL | Status: DC | PRN
  Administered 2017-05-03: 4 mg via ORAL
  Filled 2017-05-01: qty 1

## 2017-05-01 MED ORDER — SODIUM CHLORIDE 0.9 % IV SOLN
INTRAVENOUS | Status: AC
  Administered 2017-05-01: 19:00:00 via INTRAVENOUS

## 2017-05-01 MED ORDER — AMIODARONE HCL 200 MG PO TABS
200.0000 mg | ORAL_TABLET | Freq: Every day | ORAL | Status: DC
  Administered 2017-05-01 – 2017-05-04 (×4): 200 mg via ORAL
  Filled 2017-05-01 (×4): qty 1

## 2017-05-01 MED ORDER — ONDANSETRON HCL 4 MG PO TABS
8.0000 mg | ORAL_TABLET | Freq: Three times a day (TID) | ORAL | Status: DC | PRN
  Filled 2017-05-01: qty 2

## 2017-05-01 MED ORDER — ONDANSETRON HCL 4 MG/2ML IJ SOLN
4.0000 mg | Freq: Four times a day (QID) | INTRAMUSCULAR | Status: DC | PRN
  Administered 2017-05-02: 4 mg via INTRAVENOUS
  Filled 2017-05-01: qty 2

## 2017-05-01 MED ORDER — FENTANYL 50 MCG/HR TD PT72
50.0000 ug | MEDICATED_PATCH | TRANSDERMAL | Status: DC
  Administered 2017-05-04: 50 ug via TRANSDERMAL
  Filled 2017-05-01: qty 1

## 2017-05-01 MED ORDER — ALBUTEROL SULFATE HFA 108 (90 BASE) MCG/ACT IN AERS: 1.0000 | INHALATION_SPRAY | Freq: Four times a day (QID) | RESPIRATORY_TRACT | Status: DC | PRN

## 2017-05-01 MED ORDER — POLYETHYLENE GLYCOL 3350 17 G PO PACK
17.0000 g | PACK | Freq: Two times a day (BID) | ORAL | Status: DC
  Administered 2017-05-01 – 2017-05-04 (×5): 17 g via ORAL
  Filled 2017-05-01 (×6): qty 1

## 2017-05-01 NOTE — ED Notes (Signed)
Family member [name] said she had to leave to pick up another family member.

## 2017-05-01 NOTE — ED Notes (Addendum)
Pt is alert and oriented x4 and is verbally responsive. Pt  appears to have generalized weakness, and is slow in response to question. Pt denies pain at this time. Pt daughter is at bedside

## 2017-05-01 NOTE — H&P (Addendum)
History and Physical    Cindy Bradshaw HAL:[id number] DOB: 1970-09-29 DOA: 05/01/2017  PCP: [name], MD  Patient coming from: home  I have personally briefly reviewed patient's old medical records in [place]  Chief Complaint: Dig toxicity  HPI: Cindy Bradshaw is a 47 y.o. female with medical history significant of past medical history of metastatic breast cancer with extensive pulmonary disease, with her last chemotherapy about 2 weeks prior to admission, which oncologist switch her to temporarily Barry Brunner has a last resort, due to her poor prognoses, nonischemic cardiomyopathy with an EF of 25%, paroxysmal atrial fibrillation, history of PE on Lovenox, recently discharged from the hospital on 04/27/2017 for acute hypoxic respiratory failure most T factorial pulse influential acute systolic heart failure the comes in for to the hospital as her labs were checked on 04/29/2017 and showed a digoxin level of 4.4, during that time her creatinine was 1.1. Patient relates no symptoms no shortness of breath or palpitations. The daughter does relate that she has been slightly more sleepy than usual.  ED Course:  In the ED her heart rate is in the 90s her blood pressure stable her saturations are greater than 90% on room air, mild highly hyponatremic with a mild leukocytosis and thrombocytosis which is unchanged from previous, digoxin level III.2, chest x-ray and EKG as below.  Review of Systems: As per HPI otherwise 10 point review of systems negative.    Past Medical History:  Diagnosis Date  . Arthritis   . Asthma   . Back disorder    degenerative disk disease  . Breast cancer ([place])   . Cancer ([place])   . Cardiomyopathy ([place])    a. Dx 02/2017 - EF 25-30%, felt possibly due to adriamycin +/- tachy mediated from atrial fib/flutter.  Marland Kitchen GERD (gastroesophageal reflux disease)   . Mammogram abnormal 08/24/15   first  . Migraine   . Normal coronary arteries    01/2016 cath  .  PAF (paroxysmal atrial fibrillation) ([place])   . Pap smear for cervical cancer screening 08/12/15  . Paroxysmal atrial flutter [facility])     Past Surgical History:  Procedure Laterality Date  . BREAST LUMPECTOMY WITH RADIOACTIVE SEED AND SENTINEL LYMPH NODE BIOPSY Left 02/13/2016   Procedure: BREAST LUMPECTOMY WITH RADIOACTIVE SEED AND SENTINEL LYMPH NODE BIOPSY;  Surgeon: Excell Seltzer, MD;  Location: [place];  Service: General;  Laterality: Left;  . BREAST REDUCTION SURGERY Bilateral 02/21/2016   Procedure: MAMMARY REDUCTION  (BREAST)/Oncoplastic breast reconstruction;  Surgeon: Irene Limbo, MD;  Location: [place];  Service: Plastics;  Laterality: Bilateral;  . CARDIAC CATHETERIZATION [name] 01/24/2016   Procedure: Left Heart Cath and Coronary Angiography;  Surgeon: Leonie Man, MD;  Location: [facility];  Service: Cardiovascular;  Laterality: [name];  . CESAREAN SECTION    . CHOLECYSTECTOMY    . IR FLUORO GUIDE CV LINE RIGHT  02/11/2017  . IR FLUORO GUIDE PORT INSERTION RIGHT  03/19/2017  . IR US GUIDE VASC ACCESS RIGHT  02/11/2017  . IR US GUIDE VASC ACCESS RIGHT  03/19/2017  . PERIPHERAL VASCULAR CATHETERIZATION Right 02/13/2016   Procedure: PORTA CATH REMOVAL;  Surgeon: Excell Seltzer, MD;  Location: [place];  Service: General;  Laterality: Right;  . PORTACATH PLACEMENT [name] 09/12/2015   Procedure: INSERTION PORT-A-CATH;  Surgeon: Excell Seltzer, MD;  Location: [facility];  Service: General;  Laterality: [name];     reports that she has never smoked. She  has never used smokeless tobacco. She reports that she drinks alcohol. She reports that she does not use drugs.  Allergies  Allergen Reactions  . Dilaudid [Hydromorphone Hcl] Itching  . Ivp Dye [Iodinated Diagnostic Agents] Itching  . Percocet [Oxycodone-Acetaminophen] Itching  . Tape Itching and Rash    Hypofix causes rash  . Toradol [Ketorolac Tromethamine] Itching     Family History  Problem Relation Age of Onset  . Lung cancer Mother 30       2 different types of lung cancer; metastasis to brain; smoker  . Other Mother        hx of hysterectomy for unspecified reason  . Bone cancer Maternal Uncle        dx. early 63s  . Breast cancer Maternal Grandmother        dx. early 34s, s/p mastectomy  . Cancer Paternal Grandfather        unspecified type of cancer, dx. late 39s  . Congestive Heart Failure Father        smoker  . Stroke Father   . Eczema Father   . Cirrhosis Maternal Grandfather   . Heart Problems Maternal Grandfather   . Asthma Daughter   . Allergies Daughter        hives  . Lung cancer Other        (maternal great uncle; MGM's brother); had a coal stove  . Cancer Cousin        unspecified type; d. early age (paternal first cousin once-removed)  . Cancer Cousin        dx. as a kid; in remission today; (paternal 2nd cousin)  . Cancer Cousin        unspecified type; d. early 69s; (maternal 1st cousin)    Prior to Admission medications   Medication Sig Start Date End Date Taking? Authorizing Provider  albuterol (PROVENTIL HFA;VENTOLIN HFA) 108 (90 Base) MCG/ACT inhaler Inhale 1-2 puffs into the lungs every 6 (six) hours as needed for wheezing or shortness of breath. 07/24/16  Yes Waynetta Pean, PA-C  albuterol (PROVENTIL) (2.5 MG/3ML) 0.083% nebulizer solution Take 2.5 mg by nebulization every 6 (six) hours as needed for wheezing or shortness of breath.   Yes [provider]  amiodarone (PACERONE) 200 MG tablet Take 1 tablet (200 mg total) by mouth daily. 04/27/17  Yes Patrecia Pour, Christean Grief, MD  cholecalciferol (VITAMIN D) 1000 units tablet Take 1,000 Units by mouth daily.   Yes [provider]  digoxin (LANOXIN) 0.125 MG tablet Take 0.375 mg by mouth daily.  04/27/17  Yes [provider]  enoxaparin (LOVENOX) 100 MG/ML injection Inject 1 mL (100 mg total) into the skin every 12 (twelve) hours. 02/26/17  Yes  Hosie Poisson, MD  fentaNYL (DURAGESIC - DOSED MCG/HR) 50 MCG/HR Place 1 patch (50 mcg total) onto the skin every 3 (three) days. 04/27/17  Yes Patrecia Pour, Christean Grief, MD  ferrous sulfate 325 (65 FE) MG EC tablet Take 325 mg by mouth 3 (three) times daily with meals.   Yes [provider]  furosemide (LASIX) 40 MG tablet Take 3 tablets (120 mg total) by mouth 2 (two) times daily. 04/27/17  Yes Patrecia Pour, Christean Grief, MD  guaiFENesin-dextromethorphan [facility] DM) 100-10 MG/5ML syrup Take 5 mLs by mouth every 4 (four) hours as needed for cough. 02/26/17  Yes Hosie Poisson, MD  lactulose (CHRONULAC) 10 GM/15ML solution Take 30 mLs (20 g total) by mouth 2 (two) times daily as needed for mild constipation or moderate constipation.  04/27/17  Yes Patrecia Pour, Christean Grief, MD  Melatonin 10 MG TABS Take 10 mg by mouth at bedtime.    Yes [provider]  methocarbamol (ROBAXIN) 500 MG tablet Take 1 tablet (500 mg total) by mouth every 6 (six) hours as needed for muscle spasms. 04/27/17  Yes Patrecia Pour, Christean Grief, MD  metoprolol succinate (TOPROL-XL) 25 MG 24 hr tablet Take 0.5 tablets (12.5 mg total) by mouth daily. 04/28/17  Yes Patrecia Pour, Christean Grief, MD  morphine (MSIR) 30 MG tablet Take 1 tablet (30 mg total) by mouth every 4 (four) hours as needed for moderate pain or severe pain. 04/27/17  Yes Patrecia Pour, Christean Grief, MD  ondansetron (ZOFRAN) 8 MG tablet Take 1 tablet (8 mg total) by mouth every 8 (eight) hours as needed (Nausea or vomiting). 04/27/17  Yes Patrecia Pour, Christean Grief, MD  polyethylene glycol [facility] Floria Raveling) packet Take 17 g by mouth 2 (two) times daily. 04/27/17  Yes Patrecia Pour, Christean Grief, MD  potassium chloride SA (K-DUR,KLOR-CON) 20 MEQ tablet Take 2 tablets (40 mEq total) by mouth daily. 04/27/17  Yes Patrecia Pour, Christean Grief, MD  predniSONE (DELTASONE) 5 MG tablet Take 2 tablet daily for 5 days, then take 1 tablet daily for 5 days, then take half a tablet daily for 7 days 04/27/17  Yes Patrecia Pour, Christean Grief,  MD  senna (SENOKOT) 8.6 MG TABS tablet Take 1 tablet by mouth daily as needed for mild constipation.   Yes [provider]  digoxin 187.5 MCG TABS Take 0.375 mg by mouth daily. Patient not taking: Reported on 05/01/2017 04/28/17   Patrecia Pour, Christean Grief, MD    Physical Exam: Vitals:   05/01/17 0909 05/01/17 1009 05/01/17 1010 05/01/17 1134  BP: 127/77 125/73 125/73   Pulse: 99 93 92   Resp: 18 18 17    Temp: 98.6 F (37 C)     TempSrc: Oral     SpO2: (!) 88% 96% 95%   Weight:    85.3 kg (188 lb)  Height:    5\' 7"  (1.702 m)    Constitutional: NAD, calm, comfortable, But sleepy Vitals:   05/01/17 0909 05/01/17 1009 05/01/17 1010 05/01/17 1134  BP: 127/77 125/73 125/73   Pulse: 99 93 92   Resp: 18 18 17    Temp: 98.6 F (37 C)     TempSrc: Oral     SpO2: (!) 88% 96% 95%   Weight:    85.3 kg (188 lb)  Height:    5\' 7"  (1.702 m)   Eyes: PERRL, lids and conjunctivae normal ENMT: Mucous membranes are moist and pharynx is clear Neck: No thyromegaly no JVD Respiratory: Good air movement clear to auscultation.  Cardiovascular: Regular rate and rhythm with positive S1-S2 no lower extremity edema..  Abdomen: Positive bowel sounds soft nontender nondistended Musculoskeletal: No clubbing cyanosis or edema. Skin: no rashes, lesions, ulcers. No induration Neurologic: CN 2-12 grossly intact. Sensation intact, DTR normal. Strength 5/5 in all 4.  Psychiatric: Normal judgment and insight. Alert and oriented x 3. Normal mood. But slow to her spine.   Labs on Admission: I have personally reviewed following labs and imaging studies  CBC:  Recent Labs Lab 04/25/17 0423 05/01/17 1006  WBC 14.3* 11.7*  NEUTROABS 11.1* 9.7*  HGB 9.2* 8.9*  HCT 28.8* 27.9*  MCV 86.5 86.1  PLT 477* 295*   Basic Metabolic Panel:  Recent Labs Lab 04/25/17 0423 04/25/17 1100 04/26/17 0254 04/27/17 0411 05/01/17 1006  NA 133* 133* 131* 132* 130*  K  3.6 3.4* 4.2 4.9 4.4  CL 93* 95* 95* 96* 88*    CO2 30 29 28 27 28   GLUCOSE 91 141* 124* 101* 117*  BUN 17 18 15 16  36*  CREATININE 0.83 0.97 0.72 0.75 1.40*  CALCIUM 9.5 9.6 9.5 10.2 9.9  MG  --   --   --   --  1.8   GFR: Estimated Creatinine Clearance: 55.8 mL/min (A) (by C-G formula based on SCr of 1.4 mg/dL (H)). Liver Function Tests:  Recent Labs Lab 04/25/17 1100  AST 83*  ALT 75*  ALKPHOS 87  BILITOT 0.6  PROT 6.8  ALBUMIN 2.3*   No results for input(s): LIPASE, AMYLASE in the last 168 hours. No results for input(s): AMMONIA in the last 168 hours. Coagulation Profile: No results for input(s): INR, PROTIME in the last 168 hours. Cardiac Enzymes: No results for input(s): CKTOTAL, CKMB, CKMBINDEX, TROPONINI in the last 168 hours. BNP (last 3 results) No results for input(s): PROBNP in the last 8760 hours. HbA1C: No results for input(s): HGBA1C in the last 72 hours. CBG: No results for input(s): GLUCAP in the last 168 hours. Lipid Profile: No results for input(s): CHOL, HDL, LDLCALC, TRIG, CHOLHDL, LDLDIRECT in the last 72 hours. Thyroid Function Tests: No results for input(s): TSH, T4TOTAL, FREET4, T3FREE, THYROIDAB in the last 72 hours. Anemia Panel: No results for input(s): VITAMINB12, FOLATE, FERRITIN, TIBC, IRON, RETICCTPCT in the last 72 hours. Urine analysis:    Component Value Date/Time   COLORURINE YELLOW 04/11/2017 [place] 04/11/2017 1716   LABSPEC 1.006 04/11/2017 1716   LABSPEC 1.010 04/10/2016 1224   PHURINE 5.0 04/11/2017 1716   GLUCOSEU NEGATIVE 04/11/2017 1716   GLUCOSEU Negative 04/10/2016 1224   HGBUR SMALL (A) 04/11/2017 1716   BILIRUBINUR NEGATIVE 04/11/2017 1716   BILIRUBINUR Negative 04/10/2016 1224   KETONESUR NEGATIVE 04/11/2017 1716   PROTEINUR NEGATIVE 04/11/2017 1716   UROBILINOGEN 0.2 04/10/2016 1224   NITRITE NEGATIVE 04/11/2017 1716   LEUKOCYTESUR NEGATIVE 04/11/2017 1716   LEUKOCYTESUR Negative 04/10/2016 1224    Radiological Exams on Admission: No  results found.  EKG: Independently reviewed. First-degree AV block sinus rhythm, normal axes nonspecific T-wave abnormalities.  Assessment/Plan AKI (acute kidney injury) ([place]): Likely due to increase in Lasix dose, on June 2018 she was an 80 mg total daily dose, on discharge on 04/27/2017 she is on 240 mg total daily dose. Hold Lasix, hydrate gentle for 24 hours. . Monitor strict I's and O's daily weights. Recheck a basic panel in the morning.  Digitalis toxicity Her daughter relates she has not been taking her digoxin as directed, I will holdher digoxin, she only has a first-degree AV block, with check a digoxin level in 2 days will hydrate gently. Heart rate in the 90s.  Toxic metabolic encephalopathy: Likely due to narcotic accumulation due to her new found acute renal failure. We will DC the fentanyl patch were restarted tomorrow morning. Patient is awake able to give a history and carry on a conversation. She does seem sleepy.  Left lower extremity mass: She has a history of PE currently on Lovenox, unlikely to be a clot, will check an ultrasound of the left lower extremity, she denies any trauma to the leg. Her hemoglobin is stable and sound like a hematoma.  Breast cancer of upper-outer quadrant of left female breast [facility]) Follow-up with Dr. Ladona Mow as an outpatient. Plan etoposide palliative care again due to poor prognoses. Patient will probably need to make  DO NOT RESUSCITATE" for comfort, the patient is still full code.  Normocytic normochromic anemia Hemoglobin at baseline.  Chemotherapy-induced peripheral neuropathy (HCC) Stable.  Paroxysmal  A-fib (HCC) Now in sinus rhythm. Continue Lovenox.  Chronic respiratory failure (HCC) Continue home dose of oxygen.  Leukocytosis: She remained afebrile, she denies any cough shortness of breath her skin is intact. Likely due to malignancy. I will not start empiric antibiotics at this point. We'll continue to monitor fever  curve.  DVT prophylaxis: lovenox Code Status: Full Family Communication: daughter Disposition Plan:  Consults called: PMT Admission status: inpatient   Charlynne Cousins MD [facility] Pager [phone]  If 7PM-7AM, please contact night-coverage [email or web address] Password [facility]  05/01/2017, 11:54 AM

## 2017-05-01 NOTE — ED Provider Notes (Signed)
[facility] DEPT Provider Note   CSN: [id number] Arrival date & time: 05/01/17  0901     History   Chief Complaint Chief Complaint  Patient presents with  . Abnormal Lab    HPI Cindy Bradshaw is a 46 y.o. female.  HPI   46 year old female sent for evaluation after having outpatient digoxin level drawn & told that elevated. [facility]'s office was contacted. Digoxin level drawn on 8/27 was 4.4. Records to be faxed. Patient herself is not sure of all her medications or exact dosing. Her daughter is at bedside. Her daughter reports that she helps her mother take her medications and believes that she has actually been taking digoxin twice per day. Patient reports that she continues to feel tired and somewhat short of breath. She does not feel like it has acutely worsened since she was recently discharged though.  Past Medical History:  Diagnosis Date  . Arthritis   . Asthma   . Back disorder    degenerative disk disease  . Breast cancer ([place])   . Cancer ([place])   . Cardiomyopathy ([place])    a. Dx 02/2017 - EF 25-30%, felt possibly due to adriamycin +/- tachy mediated from atrial fib/flutter.  Marland Kitchen GERD (gastroesophageal reflux disease)   . Mammogram abnormal 08/24/15   first  . Migraine   . Normal coronary arteries    01/2016 cath  . PAF (paroxysmal atrial fibrillation) ([place])   . Pap smear for cervical cancer screening 08/12/15  . Paroxysmal atrial flutter [facility])     Patient Active Problem List   Diagnosis Date Noted  . Encounter for palliative care   . Acute on chronic combined systolic and diastolic heart failure ([place])   . Asthma, chronic, unspecified asthma severity, with acute exacerbation 04/11/2017  . Acute respiratory failure ([place]) 04/11/2017  . Hypoxia 04/04/2017  . Dyspnea 02/23/2017  . Acute pulmonary embolism ([place]) 02/23/2017  . Cardiomyopathy ([place])   . A-fib ([place]) 02/13/2017  . Metastatic breast cancer ([place])   . DCM (dilated cardiomyopathy) ([place])   .  Atrial fibrillation with RVR ([place]) 02/11/2017  . Chest pain   . Adnexal cyst: Right per CT 12/09/2015 12/12/2015  . Chemotherapy-induced peripheral neuropathy ([place]) 12/05/2015  . Normocytic normochromic anemia 09/19/2015  . Breast cancer of upper-outer quadrant of left female breast ([place]) 09/07/2015    Past Surgical History:  Procedure Laterality Date  . BREAST LUMPECTOMY WITH RADIOACTIVE SEED AND SENTINEL LYMPH NODE BIOPSY Left 02/13/2016   Procedure: BREAST LUMPECTOMY WITH RADIOACTIVE SEED AND SENTINEL LYMPH NODE BIOPSY;  Surgeon: Excell Seltzer, MD;  Location: [facility];  Service: General;  Laterality: Left;  . BREAST REDUCTION SURGERY Bilateral 02/21/2016   Procedure: MAMMARY REDUCTION  (BREAST)/Oncoplastic breast reconstruction;  Surgeon: Irene Limbo, MD;  Location: [facility];  Service: Plastics;  Laterality: Bilateral;  . CARDIAC CATHETERIZATION [name] 01/24/2016   Procedure: Left Heart Cath and Coronary Angiography;  Surgeon: Leonie Man, MD;  Location: [facility];  Service: Cardiovascular;  Laterality: [name];  . CESAREAN SECTION    . CHOLECYSTECTOMY    . IR FLUORO GUIDE CV LINE RIGHT  02/11/2017  . IR FLUORO GUIDE PORT INSERTION RIGHT  03/19/2017  . IR US GUIDE VASC ACCESS RIGHT  02/11/2017  . IR US GUIDE VASC ACCESS RIGHT  03/19/2017  . PERIPHERAL VASCULAR CATHETERIZATION Right 02/13/2016   Procedure: PORTA CATH REMOVAL;  Surgeon: Excell Seltzer, MD;  Location: [facility];  Service: General;  Laterality:  Right;  Marland Kitchen PORTACATH PLACEMENT [name] 09/12/2015   Procedure: INSERTION PORT-A-CATH;  Surgeon: Excell Seltzer, MD;  Location: [facility];  Service: General;  Laterality: [name];    OB History    Gravida Para Term Preterm AB Living   0 0 0 0 0     SAB TAB Ectopic Multiple Live Births   0 0 0           Home Medications    Prior to Admission medications   Medication Sig Start Date End Date Taking? Authorizing Provider    albuterol (PROVENTIL HFA;VENTOLIN HFA) 108 (90 Base) MCG/ACT inhaler Inhale 1-2 puffs into the lungs every 6 (six) hours as needed for wheezing or shortness of breath. 07/24/16  Yes Waynetta Pean, PA-C  albuterol (PROVENTIL) (2.5 MG/3ML) 0.083% nebulizer solution Take 2.5 mg by nebulization every 6 (six) hours as needed for wheezing or shortness of breath.   Yes [provider]  amiodarone (PACERONE) 200 MG tablet Take 1 tablet (200 mg total) by mouth daily. 04/27/17  Yes Patrecia Pour, Christean Grief, MD  cholecalciferol (VITAMIN D) 1000 units tablet Take 1,000 Units by mouth daily.   Yes [provider]  digoxin (LANOXIN) 0.125 MG tablet Take 0.375 mg by mouth daily.  04/27/17  Yes [provider]  enoxaparin (LOVENOX) 100 MG/ML injection Inject 1 mL (100 mg total) into the skin every 12 (twelve) hours. 02/26/17  Yes Hosie Poisson, MD  fentaNYL (DURAGESIC - DOSED MCG/HR) 50 MCG/HR Place 1 patch (50 mcg total) onto the skin every 3 (three) days. 04/27/17  Yes Patrecia Pour, Christean Grief, MD  ferrous sulfate 325 (65 FE) MG EC tablet Take 325 mg by mouth 3 (three) times daily with meals.   Yes [provider]  furosemide (LASIX) 40 MG tablet Take 3 tablets (120 mg total) by mouth 2 (two) times daily. 04/27/17  Yes Patrecia Pour, Christean Grief, MD  guaiFENesin-dextromethorphan [facility] DM) 100-10 MG/5ML syrup Take 5 mLs by mouth every 4 (four) hours as needed for cough. 02/26/17  Yes Hosie Poisson, MD  lactulose (CHRONULAC) 10 GM/15ML solution Take 30 mLs (20 g total) by mouth 2 (two) times daily as needed for mild constipation or moderate constipation. 04/27/17  Yes Patrecia Pour, Christean Grief, MD  Melatonin 10 MG TABS Take 10 mg by mouth at bedtime.    Yes [provider]  methocarbamol (ROBAXIN) 500 MG tablet Take 1 tablet (500 mg total) by mouth every 6 (six) hours as needed for muscle spasms. 04/27/17  Yes Patrecia Pour, Christean Grief, MD  metoprolol succinate (TOPROL-XL) 25 MG 24 hr tablet Take 0.5  tablets (12.5 mg total) by mouth daily. 04/28/17  Yes Patrecia Pour, Christean Grief, MD  morphine (MSIR) 30 MG tablet Take 1 tablet (30 mg total) by mouth every 4 (four) hours as needed for moderate pain or severe pain. 04/27/17  Yes Patrecia Pour, Christean Grief, MD  ondansetron (ZOFRAN) 8 MG tablet Take 1 tablet (8 mg total) by mouth every 8 (eight) hours as needed (Nausea or vomiting). 04/27/17  Yes Patrecia Pour, Christean Grief, MD  polyethylene glycol [facility] Floria Raveling) packet Take 17 g by mouth 2 (two) times daily. 04/27/17  Yes Patrecia Pour, Christean Grief, MD  potassium chloride SA (K-DUR,KLOR-CON) 20 MEQ tablet Take 2 tablets (40 mEq total) by mouth daily. 04/27/17  Yes Patrecia Pour, Christean Grief, MD  predniSONE (DELTASONE) 5 MG tablet Take 2 tablet daily for 5 days, then take 1 tablet daily for 5 days, then take half a tablet daily for 7 days 04/27/17  Yes Patrecia Pour, Christean Grief, MD  senna (SENOKOT) 8.6 MG TABS tablet Take 1 tablet by mouth daily as needed for mild constipation.   Yes [provider]  digoxin 187.5 MCG TABS Take 0.375 mg by mouth daily. Patient not taking: Reported on 05/01/2017 04/28/17   Doreatha Lew, MD    Family History Family History  Problem Relation Age of Onset  . Lung cancer Mother 36       2 different types of lung cancer; metastasis to brain; smoker  . Other Mother        hx of hysterectomy for unspecified reason  . Bone cancer Maternal Uncle        dx. early 3s  . Breast cancer Maternal Grandmother        dx. early 25s, s/p mastectomy  . Cancer Paternal Grandfather        unspecified type of cancer, dx. late 74s  . Congestive Heart Failure Father        smoker  . Stroke Father   . Eczema Father   . Cirrhosis Maternal Grandfather   . Heart Problems Maternal Grandfather   . Asthma Daughter   . Allergies Daughter        hives  . Lung cancer Other        (maternal great uncle; MGM's brother); had a coal stove  . Cancer Cousin        unspecified type; d. early age (paternal first  cousin once-removed)  . Cancer Cousin        dx. as a kid; in remission today; (paternal 2nd cousin)  . Cancer Cousin        unspecified type; d. early 44s; (maternal 1st cousin)    Social History Social History  Substance Use Topics  . Smoking status: Never Smoker  . Smokeless tobacco: Never Used  . Alcohol use 0.0 oz/week     Comment: occassional glass of wine     Allergies   Dilaudid [hydromorphone hcl]; Ivp dye [iodinated diagnostic agents]; Percocet [oxycodone-acetaminophen]; Tape; and Toradol [ketorolac tromethamine]   Review of Systems Review of Systems  All systems reviewed and negative, other than as noted in HPI.   Physical Exam Updated Vital Signs BP 125/73 (BP Location: Right Arm)   Pulse 93   Temp 98.6 F (37 C) (Oral)   Resp 18   Ht 5\' 7"  (1.702 m)   Wt 85.3 kg (188 lb)   SpO2 96%   BMI 29.44 kg/m   Physical Exam  Constitutional: She appears well-developed and well-nourished.  Tired appearing. Opens eyes to voice.   HENT:  Head: Normocephalic and atraumatic.  Eyes: Conjunctivae are normal. Right eye exhibits no discharge. Left eye exhibits no discharge.  Neck: Neck supple.  Cardiovascular: Normal rate, regular rhythm and normal heart sounds.  Exam reveals no gallop and no friction rub.   No murmur heard. Pulmonary/Chest: Effort normal and breath sounds normal. No respiratory distress.  Abdominal: Soft. She exhibits no distension. There is no tenderness.  Musculoskeletal: She exhibits no edema or tenderness.  Neurological: She is alert.  Skin: Skin is warm and dry.  Psychiatric: She has a normal mood and affect. Her behavior is normal. Thought content normal.  Nursing note and vitals reviewed.    ED Treatments / Results  Labs (all labs ordered are listed, but only abnormal results are displayed) Labs Reviewed  BASIC METABOLIC PANEL - Abnormal; Notable for the following:       Result Value  Sodium 130 (*)    Chloride 88 (*)    Glucose,  Bld 117 (*)    BUN 36 (*)    Creatinine, Ser 1.40 (*)    GFR calc non Af Amer 44 (*)    GFR calc Af Amer 51 (*)    All other components within normal limits  CBC WITH DIFFERENTIAL/PLATELET - Abnormal; Notable for the following:    WBC 11.7 (*)    RBC 3.24 (*)    Hemoglobin 8.9 (*)    HCT 27.9 (*)    RDW 18.1 (*)    Platelets 498 (*)    Neutro Abs 9.7 (*)    Monocytes Absolute 1.2 (*)    All other components within normal limits  DIGOXIN LEVEL - Abnormal; Notable for the following:    Digoxin Level 3.2 (*)    All other components within normal limits  MAGNESIUM    EKG  EKG Interpretation None      EKG:  Rhythm: sinus Rate: 93 PR: 234 ms QRS: 76 ms QTc: 473 ms ST segments: NS ST changes   Radiology No results found.  Procedures Procedures (including critical care time)  Medications Ordered in ED Medications  0.9 %  sodium chloride infusion ( Intravenous New Bag/Given 05/01/17 1134)     Initial Impression / Assessment and Plan / ED Course  I have reviewed the triage vital signs and the nursing notes.  Pertinent labs & imaging results that were available during my care of the patient were reviewed by me and considered in my medical decision making (see chart for details).     46yF with elevated digoxin level. Multiple underlying medical problems but doesn't seem to be acutely symptomatic. Digoxin level today 3.2. On 8/27 was 4.4. [facility] to fax records to ED.   EKG sinus rhythm and w/o overtly concerning changes. Potassium normal. AKI.   It sound like she is taking it inappropriately. Daughter thinks she has been getting in twice per day. Last dose yesterday evening.   Hx of cardiomyopathy with EF of ~25%, but AKI likely contributing. Gentle IVF started.   Discussed with poison control. Will admit on tele for observation. Repeat digoxin level and BMP 6 hours after the one this morning.    Final Clinical Impressions(s) / ED Diagnoses   Final  diagnoses:  Accidental digoxin overdose, initial encounter  AKI (acute kidney injury) [facility])    New Prescriptions New Prescriptions   No medications on file     Virgel Manifold, MD 05/01/17 1154

## 2017-05-01 NOTE — ED Notes (Signed)
Date and time results received: 05/01/17 1115 (use smartphrase ".now" to insert current time)  Test: Digoxin Critical Value: 3.2  Name of Provider Notified: [place]  Orders Received? Or Actions Taken?: no new orders given

## 2017-05-01 NOTE — ED Notes (Signed)
Bed: SW10 Expected date:  Expected time:  Means of arrival:  Comments: [place]

## 2017-05-01 NOTE — ED Triage Notes (Signed)
Pt states that she was told to come in for elevated Digoxin levels. Alert and oriented. Hypoxic because daughter took pt off of oxygen to come in. 2L normally

## 2017-05-02 ENCOUNTER — Ambulatory Visit: Payer: Federal, State, Local not specified - PPO

## 2017-05-02 ENCOUNTER — Telehealth: Payer: Self-pay

## 2017-05-02 ENCOUNTER — Inpatient Hospital Stay (HOSPITAL_COMMUNITY): Payer: Federal, State, Local not specified - PPO

## 2017-05-02 ENCOUNTER — Other Ambulatory Visit: Payer: Self-pay

## 2017-05-02 ENCOUNTER — Other Ambulatory Visit: Payer: Self-pay | Admitting: Hematology and Oncology

## 2017-05-02 LAB — CBC
HCT: 26.7 % — ABNORMAL LOW (ref 36.0–46.0)
Hemoglobin: 8.5 g/dL — ABNORMAL LOW (ref 12.0–15.0)
MCH: 27.6 pg (ref 26.0–34.0)
MCHC: 31.8 g/dL (ref 30.0–36.0)
MCV: 86.7 fL (ref 78.0–100.0)
PLATELETS: 491 10*3/uL — AB (ref 150–400)
RBC: 3.08 MIL/uL — AB (ref 3.87–5.11)
RDW: 18.2 % — AB (ref 11.5–15.5)
WBC: 10.5 10*3/uL (ref 4.0–10.5)

## 2017-05-02 LAB — BASIC METABOLIC PANEL
Anion gap: 12 (ref 5–15)
BUN: 38 mg/dL — ABNORMAL HIGH (ref 6–20)
CO2: 29 mmol/L (ref 22–32)
Calcium: 9.8 mg/dL (ref 8.9–10.3)
Chloride: 90 mmol/L — ABNORMAL LOW (ref 101–111)
Creatinine, Ser: 1.28 mg/dL — ABNORMAL HIGH (ref 0.44–1.00)
GFR calc Af Amer: 57 mL/min — ABNORMAL LOW (ref >60)
GFR calc non Af Amer: 49 mL/min — ABNORMAL LOW (ref >60)
Glucose, Bld: 83 mg/dL (ref 65–99)
Potassium: 4.1 mmol/L (ref 3.5–5.1)
Sodium: 131 mmol/L — ABNORMAL LOW (ref 135–145)

## 2017-05-02 LAB — DIGOXIN LEVEL
Digoxin Level: 3.3 ng/mL (ref 0.8–2.0)
Digoxin Level: 2.9 ng/mL (ref 0.8–2.0)

## 2017-05-02 MED ORDER — GUAIFENESIN-DM 100-10 MG/5ML PO SYRP
10.0000 mL | ORAL_SOLUTION | Freq: Once | ORAL | Status: AC
  Administered 2017-05-02: 10 mL via ORAL
  Filled 2017-05-02: qty 10

## 2017-05-02 NOTE — Evaluation (Signed)
Physical Therapy Evaluation Patient Details Name: Cindy Bradshaw MRN: [id number] DOB: 1971-06-30 Today's Date: 05/02/2017   History of Present Illness  46 yo female admitted d/t labs revealed digoxin level of 4.4,  creatinine was 1.1, recently hospital adm, d/c'd 8/25;   PMHx: CHF, A fib. Hx of Afib, CHF, stage IV breast cancer with mets to lungs, PE, EJ 25%  Clinical Impression  Pt admitted with above diagnosis. Pt currently with functional limitations due to the deficits listed below (see PT Problem List). * Pt will benefit from skilled PT to increase their independence and safety with mobility to allow discharge to the venue listed below.    Pt appears to be much weaker and less responsive than on previous admission; may benefit from Palliative care consult for goals of care and symptom management; pt dyspneic sitting EOB O2 sats 98%, HR in 80s;    Follow Up Recommendations SNF;Home health PT;Supervision/Assistance - 24 hour (depending on family ability to provide care)    Equipment Recommendations  3in1 (PT);Hospital bed    Recommendations for Other Services       Precautions / Restrictions Precautions Precautions: Fall Precaution Comments: O2 dep      Mobility  Bed Mobility Overal bed mobility: Needs Assistance Bed Mobility: Supine to Sit     Supine to sit: Min guard     General bed mobility comments: close guard for safety. increased time.  Elevated HOB and use of rail  Transfers                 General transfer comment: pt refused  Ambulation/Gait                Stairs            Wheelchair Mobility    Modified Rankin (Stroke Patients Only)       Balance Overall balance assessment: Needs assistance Sitting-balance support: Feet unsupported;No upper extremity supported Sitting balance-Leahy Scale: Fair         Standing balance comment: pt refused                             Pertinent Vitals/Pain Pain Assessment:  Faces Faces Pain Scale: Hurts even more Pain Location: chest Pain Descriptors / Indicators: Grimacing Pain Intervention(s): Monitored during session    Home Living Family/patient expects to be discharged to:: Private residence Living Arrangements: Children Available Help at Discharge: Family;Available PRN/intermittently (dtr and son) Type of Home: House Home Access: Level entry     Home Layout: One level Home Equipment: Walker - 2 wheels Additional Comments: pt is with minimal verbally responsive; she falls asleep easily, she does not answer most questions, partial info above taken from previous adm notes    Prior Function Level of Independence: Independent with assistive device(s)         Comments: amb with RW although pt sstates she waas too weak and SOB to move aroudn much at home; she denies having any other DME at home; no family present     Hand Dominance        Extremity/Trunk Assessment   Upper Extremity Assessment Upper Extremity Assessment: Generalized weakness    Lower Extremity Assessment Lower Extremity Assessment: Generalized weakness       Communication      Cognition Arousal/Alertness: Lethargic Behavior During Therapy: Flat affect   Area of Impairment: Following commands  Following Commands: Follows one step commands with increased time;Follows multi-step commands inconsistently              General Comments      Exercises     Assessment/Plan    PT Assessment Patient needs continued PT services  PT Problem List Decreased strength;Decreased mobility;Decreased activity tolerance;Decreased balance;Decreased knowledge of use of DME;Pain       PT Treatment Interventions DME instruction;Gait training;Therapeutic activities;Therapeutic exercise;Patient/family education;Balance training;Functional mobility training    PT Goals (Current goals can be found in the Care Plan section)  Acute Rehab PT  Goals Patient Stated Goal: none stated PT Goal Formulation: With patient Time For Goal Achievement: 05/16/17 Potential to Achieve Goals: Fair    Frequency     Barriers to discharge        Co-evaluation               AM-PAC PT "6 Clicks" Daily Activity  Outcome Measure Difficulty turning over in bed (including adjusting bedclothes, sheets and blankets)?: Unable Difficulty moving from lying on back to sitting on the side of the bed? : Unable Difficulty sitting down on and standing up from a chair with arms (e.g., wheelchair, bedside commode, etc,.)?: Unable Help needed moving to and from a bed to chair (including a wheelchair)?: A Lot   Help needed climbing 3-5 steps with a railing? : A Lot 6 Click Score: 7    End of Session Equipment Utilized During Treatment: Oxygen Activity Tolerance: Patient limited by fatigue;Patient limited by pain Patient left: Other (comment);with call bell/phone within reach;with bed alarm set (EOB per pt request to eat lunch )   PT Visit Diagnosis: Muscle weakness (generalized) (M62.81);Difficulty in walking, not elsewhere classified (R26.2)    Time: 1696-7893 PT Time Calculation (min) (ACUTE ONLY): 20 min   Charges:   PT Evaluation $PT Eval Moderate Complexity: 1 Mod     PT G CodesKenyon Ana, PT Pager: [phone] 05/02/2017  Kenyon Ana 05/02/2017, 12:26 PM

## 2017-05-02 NOTE — Progress Notes (Addendum)
Patient ID: Cindy Bradshaw, female   DOB: [birth year], 46 y.o.   MRN: [id number]  PROGRESS NOTE    Bronda K. Docter  YHC:[id number] DOB: [birth year] DOA: 05/01/2017  PCP: [name], MD   Brief Narrative:  46 year old female with history of metastatic breast cancer and extensive pulmonary metastases, last chemotherapy about 2 weeks prior to the admission, nonischemic cardiomyopathy with ejection fraction of 25%, paroxysmal atrial fibrillation, history of PE, on anticoagulation with Lovenox. Patient was recently discharged from hospital 04/27/2017 for acute respiratory failure and hypoxia and acute systolic heart failure. Her labs were checked 04/29/2017 and showed digoxin level 4.4. She was brought in due to digoxin toxicity. Chest x-ray showed known pulmonary metastasis and hilar adenopathy.  Assessment & Plan:   Active Problems: Digoxin toxicity - Per patient's family report, patient apparently not taking digoxin as directed so perhaps she to more tablets than prescribed - Digoxin 3.3 this morning - To monitor digoxin level  Acute toxic encephalopathy - Due to digoxin toxicity - Monitor mental status   Acute renal failure superimposed on chronic kidney disease stage III - Creatinine on this admission 1.4 - Her baseline creatinine is 1.04 - Elevated creatinine likely secondary to Lasix - Lasix placed on hold - Creatinine 1.28 this morning  Breast cancer of upper-outer quadrant of left female breast ([place]) / pulmonary metastasis - Follow up with oncology outpatient  Anemia of chronic disease - Likely due to malignancy and sequela of chemotherapy - Hemoglobin is 8.5  Paroxysmal atrial fibrillation - CHA2DS2-VASc Score is at least 3 - Continue amiodarone and metoprolol - Patient is on anticoagulation with Lovenox  Essential hypertension - Continue metoprolol  Leukocytosis - Likely due to steroids, no evidence of acute infectious process   DVT prophylaxis: Lovenox  subcutaneous Code Status: full code  Family Communication: No family at bedside Disposition Plan: Awaiting PT evaluation, home versus rehabilitation depending on PT evaluation. Also need to wait for digoxin level to normalize   Consultants:   PT  Procedures:   None   Antimicrobials:   None    Subjective: No overnight events.  Objective: Vitals:   05/01/17 1754 05/01/17 1756 05/01/17 2012 05/02/17 0551  BP:  137/60 132/69 132/69  Pulse:  93 92 93  Resp:  17 17 18   Temp:  (!) 97.4 F (36.3 C) 98.3 F (36.8 C) 98.2 F (36.8 C)  TempSrc:  Oral Oral Oral  SpO2:  96% 97% 93%  Weight: 86.6 kg (190 lb 14.7 oz)     Height: 5\' 5"  (1.651 m)       Intake/Output Summary (Last 24 hours) at 05/02/17 0645 Last data filed at 05/02/17 0500  Gross per 24 hour  Intake          1534.17 ml  Output                0 ml  Net          1534.17 ml   Filed Weights   05/01/17 1134 05/01/17 1754  Weight: 85.3 kg (188 lb) 86.6 kg (190 lb 14.7 oz)    Examination:  General exam: Appears calm and comfortable  Respiratory system: Clear to auscultation. Respiratory effort normal. Cardiovascular system: S1 & S2 heard, Rate controlled  Gastrointestinal system: Abdomen is nondistended, soft and nontender. No organomegaly or masses felt. Normal bowel sounds heard. Central nervous system: No focal neurological deficits. Extremities: Symmetric 5 x 5 power. Skin: No rashes, lesions or ulcers Psychiatry:  Mood & affect appropriate.  Data Reviewed: I have personally reviewed following labs and imaging studies  CBC:  Recent Labs Lab 05/01/17 1006 05/02/17 0349  WBC 11.7* 10.5  NEUTROABS 9.7*  --   HGB 8.9* 8.5*  HCT 27.9* 26.7*  MCV 86.1 86.7  PLT 498* 829*   Basic Metabolic Panel:  Recent Labs Lab 04/25/17 1100 04/26/17 0254 04/27/17 0411 05/01/17 1006 05/02/17 0349  NA 133* 131* 132* 130* 131*  K 3.4* 4.2 4.9 4.4 4.1  CL 95* 95* 96* 88* 90*  CO2 29 28 27 28 29   GLUCOSE  141* 124* 101* 117* 83  BUN 18 15 16  36* 38*  CREATININE 0.97 0.72 0.75 1.40* 1.28*  CALCIUM 9.6 9.5 10.2 9.9 9.8  MG  --   --   --  1.8  --    GFR: Estimated Creatinine Clearance: 59 mL/min (A) (by C-G formula based on SCr of 1.28 mg/dL (H)). Liver Function Tests:  Recent Labs Lab 04/25/17 1100  AST 83*  ALT 75*  ALKPHOS 87  BILITOT 0.6  PROT 6.8  ALBUMIN 2.3*   No results for input(s): LIPASE, AMYLASE in the last 168 hours. No results for input(s): AMMONIA in the last 168 hours. Coagulation Profile: No results for input(s): INR, PROTIME in the last 168 hours. Cardiac Enzymes: No results for input(s): CKTOTAL, CKMB, CKMBINDEX, TROPONINI in the last 168 hours. BNP (last 3 results) No results for input(s): PROBNP in the last 8760 hours. HbA1C: No results for input(s): HGBA1C in the last 72 hours. CBG: No results for input(s): GLUCAP in the last 168 hours. Lipid Profile: No results for input(s): CHOL, HDL, LDLCALC, TRIG, CHOLHDL, LDLDIRECT in the last 72 hours. Thyroid Function Tests: No results for input(s): TSH, T4TOTAL, FREET4, T3FREE, THYROIDAB in the last 72 hours. Anemia Panel: No results for input(s): VITAMINB12, FOLATE, FERRITIN, TIBC, IRON, RETICCTPCT in the last 72 hours.  Sepsis Labs: @LABRCNTIP (procalcitonin:4,lacticidven:4)   )No results found for this or any previous visit (from the past 240 hour(s)).    Radiology Studies: Dg Chest Portable 1 View  Result Date: 05/01/2017 CLINICAL DATA:  Increased shortness of breath this morning, history cardiomyopathy, metastatic breast cancer, GERD, asthma EXAM: PORTABLE CHEST 1 VIEW COMPARISON:  Portable exam 1134 hours compared to 04/22/2017 and correlated with CT chest of 04/12/2017 FINDINGS: RIGHT jugular Port-A-Cath with tip projecting over RIGHT atrium. Normal heart size. Hilar enlargement bilaterally corresponding to known adenopathy. Pulmonary vascularity normal. Nodular opacities in the chest bilaterally  corresponding to metastases on prior CT. Small RIGHT pleural effusion. No definite acute infiltrate or pneumothorax. No acute bone lesions. IMPRESSION: Known pulmonary metastases and hilar adenopathy. Small RIGHT pleural effusion. Electronically Signed   By: Lavonia Dana M.D.   On: 05/01/2017 11:53     Scheduled Meds: . amiodarone  200 mg Oral Daily  . enoxaparin  1 mg/kg Subcutaneous Q12H  . [START ON 05/04/2017] fentaNYL  50 mcg Transdermal Q72H  . metoprolol succinate  12.5 mg Oral QHS  . polyethylene glycol  17 g Oral BID  . predniSONE  2.5 mg Oral Q breakfast  . sodium chloride flush  10-40 mL Intracatheter Q12H   Continuous Infusions: . sodium chloride 50 mL/hr at 05/01/17 1831     LOS: 1 day    Time spent: 25 minutes  Greater than 50% of the time spent on counseling and coordinating the care.   Leisa Lenz, MD [facility] Pager [phone]  If 7PM-7AM, please contact night-coverage [email or web address] Password [email or web address] 05/02/2017, 6:45 AM

## 2017-05-02 NOTE — Telephone Encounter (Signed)
No treatment because pt in ED. Pt appt cancelled.  Future treatment will no longer be [place], but [place].

## 2017-05-02 NOTE — Progress Notes (Signed)
CRITICAL VALUE ALERT  Critical Value: Digoxin 3.3  Date & Time Notied:  05/02/17 0518  Provider Notified: Lamar Blinks [phone]  Orders Received/Actions taken:

## 2017-05-02 NOTE — Progress Notes (Signed)
CRITICAL VALUE ALERT  Critical Value:  Digoxin 2.9  Date & Time Notied:  05/02/17 1055  Provider Notified: Charlies Silvers via page   Orders Received/Actions taken: pending

## 2017-05-02 NOTE — Care Management Note (Signed)
Case Management Note  Patient Details  Name: Cindy Bradshaw MRN: [id number] Date of Birth: 01-09-1971  Subjective/Objective: 46 y/o f admitted w/AKI. From home. Active w/[facility]-home 02,HHRN/PT/OT/aide-rep [facility] aware & following. IPT cons-await recc.If home w/HHC-please place [place] orders, & f66f.                    Action/Plan:d/c plan home w/HHC   Expected Discharge Date:   (unknown)               Expected Discharge Plan:  [place]  In-House Referral:     Discharge planning Services  CM Consult  Post Acute Care Choice:  Durable Medical Equipment, Home Health (Active w/[facility] home 02, HHPT/OT/RN/aide) Choice offered to:  Patient  DME Arranged:    DME Agency:     HH Arranged:    [place] Agency:     Status of Service:  In process, will continue to follow  If discussed at Long Length of Stay Meetings, dates discussed:    Additional Comments:  Dessa Phi, RN 05/02/2017, 10:33 AM

## 2017-05-03 ENCOUNTER — Telehealth: Payer: Self-pay

## 2017-05-03 LAB — DIGOXIN LEVEL: DIGOXIN LVL: 2.2 ng/mL — AB (ref 0.8–2.0)

## 2017-05-03 MED ORDER — GUAIFENESIN 100 MG/5ML PO SOLN: 5.0000 mL | ORAL | Status: DC | PRN

## 2017-05-03 NOTE — Progress Notes (Signed)
Patient ID: Cindy Bradshaw, female   DOB: 06-29-1971, 46 y.o.   MRN: [id number]  PROGRESS NOTE    Cindy Bradshaw  GLO:[id number] DOB: April 11, 1971 DOA: 05/01/2017  PCP: [name], MD   Brief Narrative:  46 year old female with history of metastatic breast cancer and extensive pulmonary metastases, last chemotherapy about 2 weeks prior to the admission, nonischemic cardiomyopathy with ejection fraction of 25%, paroxysmal atrial fibrillation, history of PE, on anticoagulation with Lovenox. Patient was recently discharged from hospital 04/27/2017 for acute respiratory failure and hypoxia and acute systolic heart failure. Her labs were checked 04/29/2017 and showed digoxin level 4.4. She was brought in due to digoxin toxicity. Chest x-ray showed known pulmonary metastasis and hilar adenopathy.  Assessment & Plan:   Active Problems: Digoxin toxicity - Per patient's family report, patient apparently not taking digoxin as directed so perhaps she to more tablets than prescribed - Digoxin level slowly improving but still above normal limit - Still somewhat confused - Continue to monitor mental status   Acute toxic encephalopathy - Due to digoxin toxicity - Monitor mental status   Acute renal failure superimposed on chronic kidney disease stage III - Creatinine on this admission 1.4 - Her baseline creatinine is 1.04 - Elevated creatinine likely secondary to Lasix - Lasix on hold - Follow up BMP tomorrow am  Breast cancer of upper-outer quadrant of left female breast ([place]) / pulmonary metastasis - Outpt oncology follow up  Anemia of chronic disease - Likely due to malignancy and sequela of chemotherapy - Hgb stable   Paroxysmal atrial fibrillation - CHA2DS2-VASc Score is at least 3 - Continue amiodarone and metoprolol  - Continue Lovenox subQ  Essential hypertension - Continue metoprolol   Leukocytosis - WBC count WNL    DVT prophylaxis: Lovenox subQ Code Status:  full code  Family Communication: no family at the bedside Disposition Plan: home or SNF once digoxin level WNL   Consultants:   PT  Procedures:   None   Antimicrobials:   None    Subjective: No overnight events.  Objective: Vitals:   05/01/17 2012 05/02/17 0551 05/02/17 2030 05/03/17 0430  BP: 132/69 132/69 121/73 127/71  Pulse: 92 93 96 96  Resp: 17 18 19 18   Temp: 98.3 F (36.8 C) 98.2 F (36.8 C) 98 F (36.7 C) 97.7 F (36.5 C)  TempSrc: Oral Oral Oral Oral  SpO2: 97% 93% 98% 98%  Weight:      Height:        Intake/Output Summary (Last 24 hours) at 05/03/17 1008 Last data filed at 05/03/17 0656  Gross per 24 hour  Intake              500 ml  Output             1575 ml  Net            -1075 ml   Filed Weights   05/01/17 1134 05/01/17 1754  Weight: 85.3 kg (188 lb) 86.6 kg (190 lb 14.7 oz)    Physical Exam  Constitutional: Appears well-developed and well-nourished. No distress.  CVS: RRR, S1/S2 +, no murmurs, no gallops, no carotid bruit.  Pulmonary: Effort and breath sounds normal, no stridor, rhonchi, wheezes, rales.  Abdominal: Soft. BS +,  no distension, tenderness, rebound or guarding.  Musculoskeletal: Normal range of motion. No edema and no tenderness.  Lymphadenopathy: No lymphadenopathy noted, cervical, inguinal. Neuro: Alert but disoriented  Skin: Skin is warm and dry.  Psychiatric: Normal mood  and affect.     Data Reviewed: I have personally reviewed following labs and imaging studies  CBC:  Recent Labs Lab 05/01/17 1006 05/02/17 0349  WBC 11.7* 10.5  NEUTROABS 9.7*  --   HGB 8.9* 8.5*  HCT 27.9* 26.7*  MCV 86.1 86.7  PLT 498* 716*   Basic Metabolic Panel:  Recent Labs Lab 04/27/17 0411 05/01/17 1006 05/02/17 0349  NA 132* 130* 131*  K 4.9 4.4 4.1  CL 96* 88* 90*  CO2 27 28 29   GLUCOSE 101* 117* 83  BUN 16 36* 38*  CREATININE 0.75 1.40* 1.28*  CALCIUM 10.2 9.9 9.8  MG  --  1.8  --    GFR: Estimated Creatinine  Clearance: 59 mL/min (A) (by C-G formula based on SCr of 1.28 mg/dL (H)). Liver Function Tests: No results for input(s): AST, ALT, ALKPHOS, BILITOT, PROT, ALBUMIN in the last 168 hours. No results for input(s): LIPASE, AMYLASE in the last 168 hours. No results for input(s): AMMONIA in the last 168 hours. Coagulation Profile: No results for input(s): INR, PROTIME in the last 168 hours. Cardiac Enzymes: No results for input(s): CKTOTAL, CKMB, CKMBINDEX, TROPONINI in the last 168 hours. BNP (last 3 results) No results for input(s): PROBNP in the last 8760 hours. HbA1C: No results for input(s): HGBA1C in the last 72 hours. CBG: No results for input(s): GLUCAP in the last 168 hours. Lipid Profile: No results for input(s): CHOL, HDL, LDLCALC, TRIG, CHOLHDL, LDLDIRECT in the last 72 hours. Thyroid Function Tests: No results for input(s): TSH, T4TOTAL, FREET4, T3FREE, THYROIDAB in the last 72 hours. Anemia Panel: No results for input(s): VITAMINB12, FOLATE, FERRITIN, TIBC, IRON, RETICCTPCT in the last 72 hours.  Sepsis Labs: @LABRCNTIP (procalcitonin:4,lacticidven:4)   )No results found for this or any previous visit (from the past 240 hour(s)).    Radiology Studies: Dg Chest Portable 1 View  Result Date: 05/01/2017 CLINICAL DATA:  Increased shortness of breath this morning, history cardiomyopathy, metastatic breast cancer, GERD, asthma EXAM: PORTABLE CHEST 1 VIEW COMPARISON:  Portable exam 1134 hours compared to 04/22/2017 and correlated with CT chest of 04/12/2017 FINDINGS: RIGHT jugular Port-A-Cath with tip projecting over RIGHT atrium. Normal heart size. Hilar enlargement bilaterally corresponding to known adenopathy. Pulmonary vascularity normal. Nodular opacities in the chest bilaterally corresponding to metastases on prior CT. Small RIGHT pleural effusion. No definite acute infiltrate or pneumothorax. No acute bone lesions. IMPRESSION: Known pulmonary metastases and hilar adenopathy.  Small RIGHT pleural effusion. Electronically Signed   By: Lavonia Dana M.D.   On: 05/01/2017 11:53   [place] Soft Tissue Non Vascular  Result Date: 05/02/2017 CLINICAL DATA:  Left medial mass for 2 weeks EXAM: ULTRASOUND LEFT LOWER EXTREMITY LIMITED TECHNIQUE: Ultrasound examination of the lower extremity soft tissues was performed in the area of clinical concern. COMPARISON:  None. FINDINGS: 5.3 x 4 x 4.7 cm well-circumscribed hypoechoic mass in the medial which appears to be within the medial gastrocnemius muscle. Mild equivocal internal Doppler flow. No other solid or cystic mass. IMPRESSION: 1. Indeterminate 5.4 x 4.7 cm well-circumscribed hypoechoic mass in the medial gastrocnemius muscle. Differential considerations include a soft tissue mass such as a soft tissue sarcoma versus a hematoma, but a hematoma should not have internal Doppler flow. Recommend further evaluation with a MRI of the left lower leg without and with intravenous contrast. Electronically Signed   By: Kathreen Devoid   On: 05/02/2017 09:32     Scheduled Meds: . amiodarone  200 mg  Oral Daily  . enoxaparin  1 mg/kg Subcutaneous Q12H  . [START ON 05/04/2017] fentaNYL  50 mcg Transdermal Q72H  . metoprolol succinate  12.5 mg Oral QHS  . polyethylene glycol  17 g Oral BID  . sodium chloride flush  10-40 mL Intracatheter Q12H   Continuous Infusions:    LOS: 2 days    Time spent: 25 minutes  Greater than 50% of the time spent on counseling and coordinating the care.   Leisa Lenz, MD [facility] Pager [phone]  If 7PM-7AM, please contact night-coverage [email or web address] Password [email or web address] 05/03/2017, 10:08 AM

## 2017-05-03 NOTE — Telephone Encounter (Signed)
Opened chart in error.

## 2017-05-04 LAB — BASIC METABOLIC PANEL
Anion gap: 11 (ref 5–15)
BUN: 19 mg/dL (ref 6–20)
CALCIUM: 9.8 mg/dL (ref 8.9–10.3)
CO2: 28 mmol/L (ref 22–32)
CREATININE: 0.7 mg/dL (ref 0.44–1.00)
Chloride: 94 mmol/L — ABNORMAL LOW (ref 101–111)
Glucose, Bld: 88 mg/dL (ref 65–99)
Potassium: 3.9 mmol/L (ref 3.5–5.1)
SODIUM: 133 mmol/L — AB (ref 135–145)

## 2017-05-04 LAB — CBC
HCT: 25.6 % — ABNORMAL LOW (ref 36.0–46.0)
HEMOGLOBIN: 8 g/dL — AB (ref 12.0–15.0)
MCH: 27 pg (ref 26.0–34.0)
MCHC: 31.3 g/dL (ref 30.0–36.0)
MCV: 86.5 fL (ref 78.0–100.0)
Platelets: 509 10*3/uL — ABNORMAL HIGH (ref 150–400)
RBC: 2.96 MIL/uL — ABNORMAL LOW (ref 3.87–5.11)
RDW: 18.1 % — AB (ref 11.5–15.5)
WBC: 12.8 10*3/uL — ABNORMAL HIGH (ref 4.0–10.5)

## 2017-05-04 LAB — DIGOXIN LEVEL: DIGOXIN LVL: 1.3 ng/mL (ref 0.8–2.0)

## 2017-05-04 MED ORDER — HEPARIN SOD (PORK) LOCK FLUSH 100 UNIT/ML IV SOLN
500.0000 [IU] | INTRAVENOUS | Status: DC
  Administered 2017-05-04: 500 [IU]
  Filled 2017-05-04: qty 5

## 2017-05-04 MED ORDER — DIGOXIN 125 MCG PO TABS: 0.1250 mg | ORAL_TABLET | Freq: Every day | ORAL | 0 refills | Status: AC

## 2017-05-04 MED ORDER — HEPARIN SOD (PORK) LOCK FLUSH 100 UNIT/ML IV SOLN
500.0000 [IU] | INTRAVENOUS | Status: DC | PRN
  Filled 2017-05-04: qty 5

## 2017-05-04 NOTE — Care Management Note (Signed)
Case Management Note  Patient Details  Name: Cindy Bradshaw MRN: [id number] Date of Birth: 11-Oct-1970  Subjective/Objective:  Breast cancer, chronic resp failure, toxic encephalopathy, AKI, afib                  Action/Plan: Discharge Planning: Please see previous NCM notes. Contacted [facility] to make aware of dc home today. Contacted [facility] DME rep for 3n1 bedside commode for home.   PCP [name] MD  Expected Discharge Date:  05/04/17               Expected Discharge Plan:  [place]  In-House Referral:  NA  Discharge planning Services  CM Consult  Post Acute Care Choice:  Durable Medical Equipment, Home Health, Resumption of Svcs/PTA Provider (Active w/[facility] home 02, HHPT/OT/RN/aide) Choice offered to:  Patient  DME Arranged:  3-N-1 DME Agency:  [place]:  RN, PT, OT [facility] Agency:  [place]  Status of Service:  Completed, signed off  If discussed at [place] of Stay Meetings, dates discussed:    Additional Comments:  Erenest Rasher, RN 05/04/2017, 12:11 PM

## 2017-05-04 NOTE — Discharge Summary (Addendum)
Physician Discharge Summary  Cindy Bradshaw OFB:[id number] DOB: December 06, 1970 DOA: 05/01/2017  PCP: [name], MD  Admit date: 05/01/2017 Discharge date: 05/04/2017  Recommendations for Outpatient Follow-up:  Continue digoxin 0.125 mg daily, please follow up with cardiology to check digoxin level and determine if dose should be increased. Monitor hemoglobin in outpatient setting. Continue Lovenox.  Discharge Diagnoses:  Active Problems:   Breast cancer of upper-outer quadrant of left female breast (HCC)   Normocytic normochromic anemia   Chemotherapy-induced peripheral neuropathy (HCC)   A-fib (HCC)   AKI (acute kidney injury) ([place])   Chronic respiratory failure (HCC)   Digitalis toxicity   Toxic encephalopathy   Thrombocytosis (HCC)   Leucocytosis    Discharge Condition: stable   Diet recommendation: as tolerated   History of present illness:  46 year old female with history of metastatic breast cancer and extensive pulmonary metastases, last chemotherapy about 2 weeks prior to the admission, nonischemic cardiomyopathy with ejection fraction of 25%, paroxysmal atrial fibrillation, history of PE, on anticoagulation with Lovenox. Patient was recently discharged from hospital 04/27/2017 for acute respiratory failure and hypoxia and acute systolic heart failure. Her labs were checked 04/29/2017 and showed digoxin level 4.4. She was brought in due to digoxin toxicity. Chest x-ray showed known pulmonary metastasis and hilar adenopathy.  Hospital Course:   Active Problems: Digoxin toxicity - Per patient's family report on admission, patient apparently not taking digoxin as directed so perhaps she to more tablets than prescribed - I spoke with the pt daughter at the bedside and she actually said her mother may have not even taken digoxin regularly so it came her surprise that it was noted her dig level was high - Perhaps the dose of .0375 mg is too high and I have recommended  going done to 0.125 mg a day and rechecking dig level in a week and follow up with cardio and deciding on what further dose dig should be - Better mental status this am  Acute toxic encephalopathy - Due to digoxin toxicity - Improved   Acute renal failure superimposed on chronic kidney disease stage III - Creatinine on this admission 1.4 - Her baseline creatinine is 1.04 - Elevated creatinine likely secondary to Lasix - Lasix on hold - Follow up BMP tomorrow am  Breast cancer of upper-outer quadrant of left female breast ([facility]) / pulmonary metastasis - Outpt oncology follow up - Follow with Dr. Lindi Adie  Anemia of chronic disease - Likely due to malignancy and sequela of chemotherapy - Hgb stable   Paroxysmal atrial fibrillation - CHA2DS2-VASc Score is at least 3 - Continue amiodarone and metoprolol  - Continue Lovenox subQ  Essential hypertension - Continue metoprolol   Leukocytosis - WBC count WNL    DVT prophylaxis: Lovenox subQ Code Status: full code  Family Communication: daughter at the bedside    Consultants:   PT  Procedures:   None   Antimicrobials:   None    Signed:  Leisa Lenz, MD  [facility] 05/04/2017, 9:30 AM  Pager #: [phone]  Time spent in minutes: more than 30 minutes   Discharge Exam: Vitals:   05/04/17 0401 05/04/17 0423  BP: (!) 119/59   Pulse: 86   Resp: 20   Temp: 97.9 F (36.6 C)   SpO2: 99% 99%   Vitals:   05/03/17 2046 05/04/17 0401 05/04/17 0423 05/04/17 0500  BP: 117/66 (!) 119/59    Pulse: 95 86    Resp: 20 20    Temp: 98.2 F (  36.8 C) 97.9 F (36.6 C)    TempSrc: Oral Oral  Oral  SpO2: 97% 99% 99%   Weight:      Height:        General: Pt is alert, follows commands appropriately, not in acute distress Cardiovascular: Regular rate and rhythm, S1/S2 + Respiratory: Clear to auscultation bilaterally, no wheezing, no crackles, no rhonchi Abdominal: Soft, non tender, non  distended, bowel sounds +, no guarding Extremities: no edema, no cyanosis, pulses palpable bilaterally DP and PT Neuro: Grossly nonfocal  Discharge Instructions  Discharge Instructions    Call MD for:  redness, tenderness, or signs of infection (pain, swelling, redness, odor or green/yellow discharge around incision site)    Complete by:  As directed    Call MD for:  severe uncontrolled pain    Complete by:  As directed    Diet - low sodium heart healthy    Complete by:  As directed    Discharge instructions    Complete by:  As directed    Continue digoxin 0.125 mg daily, please follow up with cardiology to check digoxin level and determine if dose should be increased. Monitor hemoglobin in outpatient setting. Continue Lovenox.   Increase activity slowly    Complete by:  As directed      Allergies as of 05/04/2017      Reactions   Dilaudid [hydromorphone Hcl] Itching   Ivp Dye [iodinated Diagnostic Agents] Itching   Percocet [oxycodone-acetaminophen] Itching   Tape Itching, Rash   Hypofix causes rash   Toradol [ketorolac Tromethamine] Itching      Medication List    TAKE these medications   albuterol (2.5 MG/3ML) 0.083% nebulizer solution Commonly known as:  PROVENTIL Take 2.5 mg by nebulization every 6 (six) hours as needed for wheezing or shortness of breath.   albuterol 108 (90 Base) MCG/ACT inhaler Commonly known as:  PROVENTIL HFA;VENTOLIN HFA Inhale 1-2 puffs into the lungs every 6 (six) hours as needed for wheezing or shortness of breath.   amiodarone 200 MG tablet Commonly known as:  PACERONE Take 1 tablet (200 mg total) by mouth daily.   cholecalciferol 1000 units tablet Commonly known as:  VITAMIN D Take 1,000 Units by mouth daily.   digoxin 0.125 MG tablet Commonly known as:  LANOXIN Take 1 tablet (0.125 mg total) by mouth daily. What changed:  how much to take  Another medication with the same name was removed. Continue taking this medication, and  follow the directions you see here.   enoxaparin 100 MG/ML injection Commonly known as:  LOVENOX Inject 1 mL (100 mg total) into the skin every 12 (twelve) hours.   fentaNYL 50 MCG/HR Commonly known as:  DURAGESIC - dosed mcg/hr Place 1 patch (50 mcg total) onto the skin every 3 (three) days.   ferrous sulfate 325 (65 FE) MG EC tablet Take 325 mg by mouth 3 (three) times daily with meals.   furosemide 40 MG tablet Commonly known as:  LASIX Take 3 tablets (120 mg total) by mouth 2 (two) times daily.   guaiFENesin-dextromethorphan 100-10 MG/5ML syrup Commonly known as:  ROBITUSSIN DM Take 5 mLs by mouth every 4 (four) hours as needed for cough.   lactulose 10 GM/15ML solution Commonly known as:  CHRONULAC Take 30 mLs (20 g total) by mouth 2 (two) times daily as needed for mild constipation or moderate constipation.   Melatonin 10 MG Tabs Take 10 mg by mouth at bedtime.   methocarbamol 500 MG tablet  Commonly known as:  ROBAXIN Take 1 tablet (500 mg total) by mouth every 6 (six) hours as needed for muscle spasms.   metoprolol succinate 25 MG 24 hr tablet Commonly known as:  TOPROL-XL Take 0.5 tablets (12.5 mg total) by mouth daily.   morphine 30 MG tablet Commonly known as:  MSIR Take 1 tablet (30 mg total) by mouth every 4 (four) hours as needed for moderate pain or severe pain.   ondansetron 8 MG tablet Commonly known as:  ZOFRAN Take 1 tablet (8 mg total) by mouth every 8 (eight) hours as needed (Nausea or vomiting).   polyethylene glycol packet Commonly known as:  MIRALAX / GLYCOLAX Take 17 g by mouth 2 (two) times daily.   potassium chloride SA 20 MEQ tablet Commonly known as:  K-DUR,KLOR-CON Take 2 tablets (40 mEq total) by mouth daily.   predniSONE 5 MG tablet Commonly known as:  DELTASONE Take 2 tablet daily for 5 days, then take 1 tablet daily for 5 days, then take half a tablet daily for 7 days   senna 8.6 MG Tabs tablet Commonly known as:  SENOKOT Take  1 tablet by mouth daily as needed for mild constipation.            Discharge Care Instructions        Start     Ordered   05/04/17 0000  digoxin (LANOXIN) 0.125 MG tablet  Daily     05/04/17 0925   05/04/17 0000  Increase activity slowly     05/04/17 0925   05/04/17 0000  Diet - low sodium heart healthy     05/04/17 0925   05/04/17 0000  Call MD for:  severe uncontrolled pain     05/04/17 0925   05/04/17 0000  Call MD for:  redness, tenderness, or signs of infection (pain, swelling, redness, odor or green/yellow discharge around incision site)     05/04/17 0925   05/04/17 0000  Discharge instructions    Comments:  Continue digoxin 0.125 mg daily, please follow up with cardiology to check digoxin level and determine if dose should be increased. Monitor hemoglobin in outpatient setting. Continue Lovenox.   05/04/17 5366     Follow-up Information    [name], MD. Schedule an appointment as soon as possible for a visit in 1 week(s).   Specialty:  Internal Medicine Contact information: [address] [place] [address] [phone]        Sueanne Margarita, MD Follow up.   Specialty:  Cardiology Contact information: [address] [place] [address] [phone]        Nicholas Lose, MD Follow up.   Specialty:  Hematology and Oncology Contact information: [place] [address] [phone]            The results of significant diagnostics from this hospitalization (including imaging, microbiology, ancillary and laboratory) are listed below for reference.    Significant Diagnostic Studies: Dg Chest 2 View  Result Date: 04/17/2017 CLINICAL DATA:  History of metastatic breast cancer. Shortness of breath. EXAM: CHEST  2 VIEW COMPARISON:  04/14/2017, CT 04/12/2017 FINDINGS: Cardiomegaly with vascular congestion. Suspect small layering effusions. Bibasilar atelectasis or infiltrates.  Nodular airspace opacities compatible with known metastatic disease. No real change since prior study. IMPRESSION: No significant change since prior study. Electronically Signed   By: Rolm Baptise M.D.   On: 04/17/2017 10:30   Dg Abd 1 View  Result Date:  04/20/2017 CLINICAL DATA:  Vomiting for 3 days.  Chemotherapy for breast cancer EXAM: ABDOMEN - 1 VIEW COMPARISON:  Staging CT 01/30/2017 FINDINGS: Diffuse formed stool, confluent throughout the left colon. No obstruction or rectal impaction. No concerning mass effect or calcification. Cholecystectomy clips. IMPRESSION: Moderate to large stool volume without obstruction or rectal impaction. Electronically Signed   By: Monte Fantasia M.D.   On: 04/20/2017 08:32   Ct Head Wo Contrast  Result Date: 04/12/2017 CLINICAL DATA:  History of metastatic breast carcinoma and pulmonary embolus. The patient is on Lovenox. Increasing shortness breath over the past 1-2 weeks. EXAM: CT HEAD WITHOUT CONTRAST TECHNIQUE: Contiguous axial images were obtained from the base of the skull through the vertex without intravenous contrast. COMPARISON:  None. FINDINGS: Brain: Appears normal without hemorrhage, infarct, mass lesion, mass effect, midline shift or abnormal extra-axial fluid collection. No hydrocephalus pneumocephalus. Vascular: Negative. Skull: Intact.  No focal bone lesion is identified. Sinuses/Orbits: Negative. Other: None. IMPRESSION: Negative head CT. Electronically Signed   By: Inge Rise M.D.   On: 04/12/2017 12:32   Ct Angio Chest Pe W Or Wo Contrast  Result Date: 04/12/2017 CLINICAL DATA:  Shortness of Breath and hypoxia EXAM: CT ANGIOGRAPHY CHEST WITH CONTRAST TECHNIQUE: Multidetector CT imaging of the chest was performed using the standard protocol during bolus administration of intravenous contrast. Multiplanar CT image reconstructions and MIPs were obtained to evaluate the vascular anatomy. CONTRAST:  100 mL Isovue 370. COMPARISON:  02/23/2017  FINDINGS: Cardiovascular: The thoracic aorta shows no significant atherosclerotic calcifications. No aneurysmal dilatation or dissection is identified. The pulmonary artery is well visualized and demonstrates a normal branching pattern. Some attenuation of the branches is noted secondary to hilar adenopathy. No definitive filling defect to suggest pulmonary embolism is identified. The previously seen right-sided pulmonary embolus has resolved in the interval. Mediastinum/Nodes: There again noted changes consistent with the given clinical history of metastatic breast carcinoma with mediastinal and hilar adenopathy identified. The overall appearance is stable when compared with the prior exam. Lungs/Pleura: Moderate right-sided pleural effusion is noted. Tiny left pleural effusion is seen. Lower lobe atelectatic changes are seen. A large infrahilar right middle lobe mass lesion is noted. Scattered pulmonary metastatic lesions are noted throughout both lungs. Some of these have increased in size particularly along the left cardiac border in the lingula on image number 60 of series 7 as well as in the lower lobe on the same image. Upper Abdomen: No acute abnormality in the upper abdomen noted. Musculoskeletal: Degenerative changes of thoracic spine are noted. No definitive bony metastatic disease is noted. Lesions are again noted in the right breast consistent with the given clinical history P Review of the MIP images confirms the above findings. IMPRESSION: Changes consistent with extensive metastatic disease in the lungs bilaterally as well as the hila and mediastinum. Some slight increase in some of the lung lesions is noted. No evidence of pulmonary emboli. The previously seen right pulmonary embolus has resolved in the interval. Bilateral pleural effusions right greater than left. Changes consistent with right breast carcinoma Electronically Signed   By: Inez Catalina M.D.   On: 04/12/2017 12:38   Dg Chest  Portable 1 View  Result Date: 05/01/2017 CLINICAL DATA:  Increased shortness of breath this morning, history cardiomyopathy, metastatic breast cancer, GERD, asthma EXAM: PORTABLE CHEST 1 VIEW COMPARISON:  Portable exam 1134 hours compared to 04/22/2017 and correlated with CT chest of 04/12/2017 FINDINGS: RIGHT jugular Port-A-Cath with tip projecting over RIGHT atrium. Normal  heart size. Hilar enlargement bilaterally corresponding to known adenopathy. Pulmonary vascularity normal. Nodular opacities in the chest bilaterally corresponding to metastases on prior CT. Small RIGHT pleural effusion. No definite acute infiltrate or pneumothorax. No acute bone lesions. IMPRESSION: Known pulmonary metastases and hilar adenopathy. Small RIGHT pleural effusion. Electronically Signed   By: Lavonia Dana M.D.   On: 05/01/2017 11:53   Dg Chest Port 1 View  Result Date: 04/22/2017 CLINICAL DATA:  Shortness of breath.  History of leukocytosis. EXAM: PORTABLE CHEST 1 VIEW COMPARISON:  04/17/2017.  CT 04/12/2017 . FINDINGS: PowerPort catheter noted with lead tip projected over right atrium. Stable cardiomegaly stable densities are noted in the lungs consistent with known metastatic disease. Basilar atelectasis. Small right pleural effusion. No pneumothorax. Surgical clips left chest. No acute bony abnormality. IMPRESSION: 1. PowerPort catheter stable position. 2. Stable pulmonary densities noted bilaterally consistent known metastatic disease. Basilar atelectasis. Small right pleural effusion. No significant changes noted from prior exams . Electronically Signed   By: Marcello Moores  Register   On: 04/22/2017 08:53   Dg Chest Port 1 View  Result Date: 04/14/2017 CLINICAL DATA:  Hypoxia today, breast cancer with pulmonary metastases, asthma, atrial fibrillation EXAM: PORTABLE CHEST 1 VIEW COMPARISON:  Portable exam 1414 hours compared 04/11/2017 FINDINGS: RIGHT jugular Port-A-Cath stable tip projecting over cavoatrial junction.  Enlargement of cardiac silhouette. Enlarged central pulmonary arteries. Bibasilar pleural effusions and atelectasis. RIGHT upper lobe opacity question metastasis. Probable LEFT lower lobe and LEFT perihilar metastases. No pneumothorax. No definite osseous lesions. IMPRESSION: Pulmonary metastases with bibasilar pleural effusions atelectasis. Worsened bibasilar aeration since previous exam. Electronically Signed   By: Lavonia Dana M.D.   On: 04/14/2017 15:28   Dg Chest Portable 1 View  Result Date: 04/11/2017 CLINICAL DATA:  Shortness of breath for 2 days with cough, history breast cancer post chemotherapy, history asthma, cardiomyopathy, atrial fibrillation, LEFT lumpectomy with radioactive seed implantation and sentinel node biopsy EXAM: PORTABLE CHEST 1 VIEW COMPARISON:  Portable exam 1428 hours compared to 02/22/2017 FINDINGS: RIGHT jugular Port-A-Cath with tip projecting over SVC. Enlargement of cardiac silhouette. Prominent mediastinum, hila, and AP window identified, corresponding to adenopathy on CT. Multiple pulmonary masses again identified compatible with metastases. Bibasilar atelectasis. No gross pleural effusion or pneumothorax. Osseous structures unremarkable. IMPRESSION: Pulmonary metastases. Mediastinal and hilar adenopathy. Bibasilar atelectasis. Electronically Signed   By: Lavonia Dana M.D.   On: 04/11/2017 14:47   [place] Soft Tissue Non Vascular  Result Date: 05/02/2017 CLINICAL DATA:  Left medial mass for 2 weeks EXAM: ULTRASOUND LEFT LOWER EXTREMITY LIMITED TECHNIQUE: Ultrasound examination of the lower extremity soft tissues was performed in the area of clinical concern. COMPARISON:  None. FINDINGS: 5.3 x 4 x 4.7 cm well-circumscribed hypoechoic mass in the medial which appears to be within the medial gastrocnemius muscle. Mild equivocal internal Doppler flow. No other solid or cystic mass. IMPRESSION: 1. Indeterminate 5.4 x 4.7 cm well-circumscribed hypoechoic mass in the  medial gastrocnemius muscle. Differential considerations include a soft tissue mass such as a soft tissue sarcoma versus a hematoma, but a hematoma should not have internal Doppler flow. Recommend further evaluation with a MRI of the left lower leg without and with intravenous contrast. Electronically Signed   By: Kathreen Devoid   On: 05/02/2017 09:32    Microbiology: No results found for this or any previous visit (from the past 240 hour(s)).   Labs: Basic Metabolic Panel:  Recent Labs Lab 05/01/17 1006 05/02/17 0349 05/04/17 0535  NA 130* 131* 133*  K 4.4 4.1 3.9  CL 88* 90* 94*  CO2 28 29 28   GLUCOSE 117* 83 88  BUN 36* 38* 19  CREATININE 1.40* 1.28* 0.70  CALCIUM 9.9 9.8 9.8  MG 1.8  --   --    Liver Function Tests: No results for input(s): AST, ALT, ALKPHOS, BILITOT, PROT, ALBUMIN in the last 168 hours. No results for input(s): LIPASE, AMYLASE in the last 168 hours. No results for input(s): AMMONIA in the last 168 hours. CBC:  Recent Labs Lab 05/01/17 1006 05/02/17 0349 05/04/17 0535  WBC 11.7* 10.5 12.8*  NEUTROABS 9.7*  --   --   HGB 8.9* 8.5* 8.0*  HCT 27.9* 26.7* 25.6*  MCV 86.1 86.7 86.5  PLT 498* 491* 509*   Cardiac Enzymes: No results for input(s): CKTOTAL, CKMB, CKMBINDEX, TROPONINI in the last 168 hours. BNP: BNP (last 3 results)  Recent Labs  09/20/16 1858 02/22/17 2336 04/11/17 1421  BNP 33.9 116.2* 225.9*    ProBNP (last 3 results) No results for input(s): PROBNP in the last 8760 hours.  CBG: No results for input(s): GLUCAP in the last 168 hours.

## 2017-05-04 NOTE — Progress Notes (Signed)
Patient's rhythm changed from sinus to atrial fibrillation. MD notified. EKG forthcoming. Will continue to monitor.

## 2017-05-04 NOTE — Discharge Instructions (Signed)
Digoxin Toxicity Digoxin toxicity, also called digoxin poisoning, is a condition that occurs when you have too much digoxin in your blood. Digoxin is a medicine that is used to help a weakened heart function properly. Digoxin increases the strength of the heart muscle, helps to maintain a normal heart rhythm, and helps to remove excess water from the body. When there is too much digoxin in the body, it acts like a poison (toxin). Digoxin toxicity can be life-threatening and must be treated in a hospital. What are the causes? Digoxin toxicity can be caused by taking too much digoxin (overdosing). Overdose can happen accidentally. Digoxin toxicity can also occur if you are taking the correct dosage of digoxin but there are other factors affecting the digoxin levels in your body, such as:  Taking other medicines that interact badly with digoxin.  Having low potassium or magnesium levels.  Having reduced kidney function. This prevents digoxin from leaving your body at the normal speed.  What are the signs or symptoms? Symptoms of this condition include:  Confusion.  Changes in your vision, such as: ? Seeing the color yellow more than you normally do. ? Blurred vision. ? Increased sensitivity to light. ? Seeing flashing lights.  Irregular heartbeats (arrhythmias). These heartbeats may be too fast or too slow.  Loss of appetite.  Nausea or vomiting.  Diarrhea.  How is this diagnosed? This condition may be diagnosed based on:  Your symptoms.  Blood tests to check your digoxin, potassium, and magnesium levels.  An electrocardiogram (ECG). This is a test that records the electrical impulses of your heart.  How is this treated? This condition is treated by lowering your digoxin levels. This must be done in the hospital. Your treatment will depend on whether the digoxin toxicity is of sudden onset because of overdose (acute) or more gradual onset (chronic). To lower your digoxin levels,  your health care provider may:  Give you a medicine that absorbs digoxin in the stomach (activated charcoal). The charcoal may be given to swallow or given through a tube that goes from your nose to your stomach (nasogastric tube).  Perform gastric lavage. In this procedure, a tube is placed through your nose or mouth into your stomach. The tube is used to remove digoxin that has not been digested yet. It may also be used to put medicines directly into your stomach to help stop digoxin from being absorbed.  Give you an injectable medicine called a digoxin-binding antibody.  Follow these instructions at home: Lifestyle  Limit alcohol intake to no more than 1 drink a day for nonpregnant women and 2 drinks a day for men. One drink equals 12 oz of beer, 5 oz of wine, or 1 oz of hard liquor.  Do not use any tobacco products, such as cigarettes, chewing tobacco, and e-cigarettes. If you need help quitting, ask your health care provider.  Tell your health care provider if you drink caffeine or alcohol, you smoke, or you use drugs. This may affect the way your digoxin medicine works. Eating and drinking  Follow instructions from your health care provider about eating or drinking restrictions.  Talk to your health care provider about your diet. The amount of fiber that you eat may affect the way your digoxin medicine works.  Drink enough fluid to keep your urine clear or pale yellow. General instructions  Return to your normal activities as told by your health care provider. Ask your health care provider what activities are safe for you.  If you had tests done, it is your responsibility to get your test results. Ask your health care provider or the department performing the test when your results will be ready.  Keep all follow-up visits as told by your health care provider. This is important. How is this prevented? The following instructions can help prevent digoxin toxicity from happening  again (recurring):  Take your digoxin medicine exactly as prescribed. ? If you miss a dose, take it as soon as you can, unless it is almost time to take your next dose. If it is almost time for your next dose, take that one dose as normal. Do not take two doses at the same time. ? Take your digoxin doses at regular intervals. Do not take your medicine more often than directed. ? Swallow your digoxin medicine with water. It is best to take digoxin on an empty stomach, at least 1 hour before a meal or 2 hours after a meal.  Take over-the-counter and prescription medicines only as told by your health care provider. ? Check with your health care provider before stopping or starting any medicines. ? Do not take antacids without your health care provider's permission. ? Do not take over-the-counter medicines for pain, allergies, coughs, or colds without your health care provider's permission.  Follow instructions from your health care provider about eating or drinking restrictions.  Tell all health care providers who care for you that you are taking digoxin.  Keep a list of all of the drugs that you take, including over-the-counter medicines, nutritional supplements, and herbal products. Bring this list with you to all of your medical visits.  Read the drug inserts that come with your medicines.  Check your heart rate and blood pressure regularly. Ask your health care provider what your heart rate and blood pressure should be, and when you should seek medical care.  Have your digoxin, potassium, and magnesium levels checked regularly.  Get help right away if:  You develop symptoms of digoxin toxicity.  You develop chest pain.  You have shortness of breath.  You faint.  You have a heartbeat that is too fast or too slow. This information is not intended to replace advice given to you by your health care provider. Make sure you discuss any questions you have with your health care  provider. Document Released: 08/10/2002 Document Revised: 01/26/2016 Document Reviewed: 06/30/2015 Elsevier Interactive Patient Education  [employer].

## 2017-05-08 ENCOUNTER — Inpatient Hospital Stay (HOSPITAL_COMMUNITY)
Admission: EM | Admit: 2017-05-08 | Discharge: 2017-05-14 | DRG: 189 | Disposition: A | Discharge status: Home Health | Payer: Federal, State, Local not specified - PPO | Attending: Internal Medicine | Admitting: Internal Medicine

## 2017-05-08 ENCOUNTER — Encounter (HOSPITAL_COMMUNITY): Payer: Self-pay | Admitting: Family Medicine

## 2017-05-08 ENCOUNTER — Emergency Department (HOSPITAL_COMMUNITY): Payer: Federal, State, Local not specified - PPO

## 2017-05-08 DIAGNOSIS — Z91048 Other nonmedicinal substance allergy status: Secondary | ICD-10-CM

## 2017-05-08 DIAGNOSIS — I482 Chronic atrial fibrillation: Secondary | ICD-10-CM | POA: Diagnosis present

## 2017-05-08 DIAGNOSIS — J9601 Acute respiratory failure with hypoxia: Secondary | ICD-10-CM | POA: Diagnosis not present

## 2017-05-08 DIAGNOSIS — F419 Anxiety disorder, unspecified: Secondary | ICD-10-CM | POA: Diagnosis present

## 2017-05-08 DIAGNOSIS — Z86711 Personal history of pulmonary embolism: Secondary | ICD-10-CM

## 2017-05-08 DIAGNOSIS — Z7901 Long term (current) use of anticoagulants: Secondary | ICD-10-CM

## 2017-05-08 DIAGNOSIS — R609 Edema, unspecified: Secondary | ICD-10-CM

## 2017-05-08 DIAGNOSIS — Z7189 Other specified counseling: Secondary | ICD-10-CM

## 2017-05-08 DIAGNOSIS — G9340 Encephalopathy, unspecified: Secondary | ICD-10-CM | POA: Diagnosis present

## 2017-05-08 DIAGNOSIS — R630 Anorexia: Secondary | ICD-10-CM | POA: Diagnosis present

## 2017-05-08 DIAGNOSIS — R52 Pain, unspecified: Secondary | ICD-10-CM

## 2017-05-08 DIAGNOSIS — G893 Neoplasm related pain (acute) (chronic): Secondary | ICD-10-CM | POA: Diagnosis present

## 2017-05-08 DIAGNOSIS — Z853 Personal history of malignant neoplasm of breast: Secondary | ICD-10-CM

## 2017-05-08 DIAGNOSIS — Z8249 Family history of ischemic heart disease and other diseases of the circulatory system: Secondary | ICD-10-CM

## 2017-05-08 DIAGNOSIS — C7800 Secondary malignant neoplasm of unspecified lung: Secondary | ICD-10-CM | POA: Diagnosis present

## 2017-05-08 DIAGNOSIS — Z825 Family history of asthma and other chronic lower respiratory diseases: Secondary | ICD-10-CM

## 2017-05-08 DIAGNOSIS — I420 Dilated cardiomyopathy: Secondary | ICD-10-CM | POA: Diagnosis present

## 2017-05-08 DIAGNOSIS — D649 Anemia, unspecified: Secondary | ICD-10-CM | POA: Diagnosis present

## 2017-05-08 DIAGNOSIS — G43909 Migraine, unspecified, not intractable, without status migrainosus: Secondary | ICD-10-CM | POA: Diagnosis present

## 2017-05-08 DIAGNOSIS — Z91041 Radiographic dye allergy status: Secondary | ICD-10-CM

## 2017-05-08 DIAGNOSIS — Z9049 Acquired absence of other specified parts of digestive tract: Secondary | ICD-10-CM

## 2017-05-08 DIAGNOSIS — C7951 Secondary malignant neoplasm of bone: Secondary | ICD-10-CM | POA: Diagnosis present

## 2017-05-08 DIAGNOSIS — C787 Secondary malignant neoplasm of liver and intrahepatic bile duct: Secondary | ICD-10-CM | POA: Diagnosis present

## 2017-05-08 DIAGNOSIS — R64 Cachexia: Secondary | ICD-10-CM | POA: Diagnosis present

## 2017-05-08 DIAGNOSIS — Z801 Family history of malignant neoplasm of trachea, bronchus and lung: Secondary | ICD-10-CM

## 2017-05-08 DIAGNOSIS — J90 Pleural effusion, not elsewhere classified: Secondary | ICD-10-CM

## 2017-05-08 DIAGNOSIS — I4891 Unspecified atrial fibrillation: Secondary | ICD-10-CM | POA: Diagnosis present

## 2017-05-08 DIAGNOSIS — Z888 Allergy status to other drugs, medicaments and biological substances status: Secondary | ICD-10-CM

## 2017-05-08 DIAGNOSIS — Z808 Family history of malignant neoplasm of other organs or systems: Secondary | ICD-10-CM

## 2017-05-08 DIAGNOSIS — I4892 Unspecified atrial flutter: Secondary | ICD-10-CM | POA: Diagnosis present

## 2017-05-08 DIAGNOSIS — T451X5A Adverse effect of antineoplastic and immunosuppressive drugs, initial encounter: Secondary | ICD-10-CM | POA: Diagnosis present

## 2017-05-08 DIAGNOSIS — Z823 Family history of stroke: Secondary | ICD-10-CM

## 2017-05-08 DIAGNOSIS — I5023 Acute on chronic systolic (congestive) heart failure: Secondary | ICD-10-CM | POA: Diagnosis present

## 2017-05-08 DIAGNOSIS — Z885 Allergy status to narcotic agent status: Secondary | ICD-10-CM

## 2017-05-08 DIAGNOSIS — Z803 Family history of malignant neoplasm of breast: Secondary | ICD-10-CM

## 2017-05-08 DIAGNOSIS — K219 Gastro-esophageal reflux disease without esophagitis: Secondary | ICD-10-CM | POA: Diagnosis present

## 2017-05-08 DIAGNOSIS — C7931 Secondary malignant neoplasm of brain: Secondary | ICD-10-CM | POA: Diagnosis present

## 2017-05-08 DIAGNOSIS — I480 Paroxysmal atrial fibrillation: Secondary | ICD-10-CM | POA: Diagnosis present

## 2017-05-08 DIAGNOSIS — C50919 Malignant neoplasm of unspecified site of unspecified female breast: Secondary | ICD-10-CM | POA: Diagnosis present

## 2017-05-08 HISTORY — DX: Malignant neoplasm of unspecified site of unspecified female breast: C50.919

## 2017-05-08 HISTORY — DX: Secondary malignant neoplasm of brain: C79.31

## 2017-05-08 HISTORY — DX: Secondary malignant neoplasm of unspecified lung: C78.00

## 2017-05-08 LAB — COMPREHENSIVE METABOLIC PANEL
ALBUMIN: 2 g/dL — AB (ref 3.5–5.0)
ALT: 126 U/L — AB (ref 14–54)
AST: 121 U/L — AB (ref 15–41)
Alkaline Phosphatase: 78 U/L (ref 38–126)
Anion gap: 11 (ref 5–15)
BILIRUBIN TOTAL: 0.8 mg/dL (ref 0.3–1.2)
BUN: 32 mg/dL — AB (ref 6–20)
CHLORIDE: 87 mmol/L — AB (ref 101–111)
CO2: 28 mmol/L (ref 22–32)
CREATININE: 1.34 mg/dL — AB (ref 0.44–1.00)
Calcium: 9.4 mg/dL (ref 8.9–10.3)
GFR calc Af Amer: 54 mL/min — ABNORMAL LOW (ref >60)
GFR calc non Af Amer: 46 mL/min — ABNORMAL LOW (ref >60)
GLUCOSE: 113 mg/dL — AB (ref 65–99)
POTASSIUM: 3.9 mmol/L (ref 3.5–5.1)
Sodium: 126 mmol/L — ABNORMAL LOW (ref 135–145)
Total Protein: 6.9 g/dL (ref 6.5–8.1)

## 2017-05-08 LAB — CBC WITH DIFFERENTIAL/PLATELET
Basophils Absolute: 0 10*3/uL (ref 0.0–0.1)
Basophils Relative: 0 %
EOS PCT: 0 %
Eosinophils Absolute: 0 10*3/uL (ref 0.0–0.7)
HEMATOCRIT: 24.6 % — AB (ref 36.0–46.0)
Hemoglobin: 7.8 g/dL — ABNORMAL LOW (ref 12.0–15.0)
LYMPHS ABS: 1.2 10*3/uL (ref 0.7–4.0)
Lymphocytes Relative: 9 %
MCH: 26.7 pg (ref 26.0–34.0)
MCHC: 31.7 g/dL (ref 30.0–36.0)
MCV: 84.2 fL (ref 78.0–100.0)
MONO ABS: 1.7 10*3/uL — AB (ref 0.1–1.0)
MONOS PCT: 13 %
NEUTROS ABS: 10 10*3/uL — AB (ref 1.7–7.7)
Neutrophils Relative %: 78 %
Platelets: 478 10*3/uL — ABNORMAL HIGH (ref 150–400)
RBC: 2.92 MIL/uL — ABNORMAL LOW (ref 3.87–5.11)
RDW: 18.4 % — AB (ref 11.5–15.5)
WBC: 12.9 10*3/uL — ABNORMAL HIGH (ref 4.0–10.5)

## 2017-05-08 LAB — CG4 I-STAT (LACTIC ACID): LACTIC ACID, VENOUS: 1.07 mmol/L (ref 0.5–1.9)

## 2017-05-08 LAB — POCT I-STAT, CHEM 8
BUN: 28 mg/dL — ABNORMAL HIGH (ref 6–20)
CHLORIDE: 86 mmol/L — AB (ref 101–111)
Calcium, Ion: 1.22 mmol/L (ref 1.15–1.40)
Creatinine, Ser: 1.3 mg/dL — ABNORMAL HIGH (ref 0.44–1.00)
GLUCOSE: 114 mg/dL — AB (ref 65–99)
HCT: 26 % — ABNORMAL LOW (ref 36.0–46.0)
Hemoglobin: 8.8 g/dL — ABNORMAL LOW (ref 12.0–15.0)
Potassium: 3.9 mmol/L (ref 3.5–5.1)
Sodium: 125 mmol/L — ABNORMAL LOW (ref 135–145)
TCO2: 29 mmol/L (ref 22–32)

## 2017-05-08 LAB — POCT I-STAT TROPONIN I: Troponin i, poc: 0.02 ng/mL (ref 0.00–0.08)

## 2017-05-08 LAB — BRAIN NATRIURETIC PEPTIDE: B NATRIURETIC PEPTIDE 5: 42.3 pg/mL (ref 0.0–100.0)

## 2017-05-08 LAB — PROTIME-INR
INR: 1.22
Prothrombin Time: 15.3 seconds — ABNORMAL HIGH (ref 11.4–15.2)

## 2017-05-08 MED ORDER — FUROSEMIDE 10 MG/ML IJ SOLN
40.0000 mg | Freq: Once | INTRAMUSCULAR | Status: AC
  Administered 2017-05-08: 40 mg via INTRAVENOUS
  Filled 2017-05-08: qty 4

## 2017-05-08 MED ORDER — IPRATROPIUM-ALBUTEROL 0.5-2.5 (3) MG/3ML IN SOLN
3.0000 mL | Freq: Once | RESPIRATORY_TRACT | Status: AC
  Administered 2017-05-08: 3 mL via RESPIRATORY_TRACT
  Filled 2017-05-08: qty 3

## 2017-05-08 MED ORDER — MORPHINE SULFATE (PF) 4 MG/ML IV SOLN
4.0000 mg | Freq: Once | INTRAVENOUS | Status: AC
  Administered 2017-05-08: 4 mg via INTRAVENOUS
  Filled 2017-05-08: qty 1

## 2017-05-08 MED ORDER — POTASSIUM CHLORIDE CRYS ER 20 MEQ PO TBCR
40.0000 meq | EXTENDED_RELEASE_TABLET | Freq: Once | ORAL | Status: AC
  Administered 2017-05-09: 40 meq via ORAL
  Filled 2017-05-08: qty 2

## 2017-05-08 MED ORDER — ONDANSETRON HCL 4 MG/2ML IJ SOLN
4.0000 mg | Freq: Once | INTRAMUSCULAR | Status: AC
  Administered 2017-05-08: 4 mg via INTRAVENOUS
  Filled 2017-05-08: qty 2

## 2017-05-08 NOTE — ED Triage Notes (Signed)
Patient is experiencing shortness of breath, left sided chest pain, and bilateral lower extremity swelling. Patient a breast cancer patient who is taking oral chemotherapy. Patient does wear oxygen all the time. Unknown if she has fevers but had chills.

## 2017-05-08 NOTE — ED Provider Notes (Signed)
[facility] DEPT Provider Note   CSN: [id number] Arrival date & time: 05/08/17  2105     History   Chief Complaint Chief Complaint  Patient presents with  . Shortness of Breath  . Chest Pain  . Leg Swelling    HPI Cindy Bradshaw is a 46 y.o. female.  46 yo F with a chief complaint of shortness of breath. Patient has chronic shortness of breath at baseline but feels that it worsened a couple hours prior to arrival. Having some sharp chest pain as well. Radiates under both breasts. Worse with coughing. Feels that her cough is mildly worse than baseline. Denies production. Denies fevers or chills.   The history is provided by the patient.  Shortness of Breath  This is a chronic problem. The average episode lasts 2 hours. The problem occurs continuously.The current episode started 1 to 2 hours ago. The problem has been rapidly worsening. Associated symptoms include leg swelling. Pertinent negatives include no fever, no headaches, no rhinorrhea, no wheezing, no chest pain and no vomiting. She has tried nothing for the symptoms. The treatment provided no relief. She has had prior hospitalizations. She has had prior ED visits. Associated medical issues include chronic lung disease.  Chest Pain   Associated symptoms include shortness of breath. Pertinent negatives include no dizziness, no fever, no headaches, no nausea, no palpitations and no vomiting.    Past Medical History:  Diagnosis Date  . Arthritis   . Asthma   . Back disorder    degenerative disk disease  . Breast cancer ([place])   . Cancer ([place])   . Cardiomyopathy ([place])    a. Dx 02/2017 - EF 25-30%, felt possibly due to adriamycin +/- tachy mediated from atrial fib/flutter.  Marland Kitchen GERD (gastroesophageal reflux disease)   . Mammogram abnormal 08/24/15   first  . Migraine   . Normal coronary arteries    01/2016 cath  . PAF (paroxysmal atrial fibrillation) ([place])   . Pap smear for cervical cancer screening 08/12/15  . Paroxysmal  atrial flutter [facility])     Patient Active Problem List   Diagnosis Date Noted  . AKI (acute kidney injury) ([employer]) 05/01/2017  . Chronic respiratory failure ([place]) 05/01/2017  . Digitalis toxicity 05/01/2017  . Toxic encephalopathy 05/01/2017  . Thrombocytosis ([place]) 05/01/2017  . Leucocytosis 05/01/2017  . Encounter for palliative care   . Acute on chronic combined systolic and diastolic heart failure ([place])   . Asthma, chronic, unspecified asthma severity, with acute exacerbation 04/11/2017  . Acute respiratory failure ([place]) 04/11/2017  . Hypoxia 04/04/2017  . Dyspnea 02/23/2017  . Acute pulmonary embolism ([place]) 02/23/2017  . Cardiomyopathy ([place])   . A-fib ([place]) 02/13/2017  . Metastatic breast cancer ([place])   . DCM (dilated cardiomyopathy) ([place])   . Atrial fibrillation with RVR ([place]) 02/11/2017  . Chest pain   . Adnexal cyst: Right per CT 12/09/2015 12/12/2015  . Chemotherapy-induced peripheral neuropathy ([place]) 12/05/2015  . Normocytic normochromic anemia 09/19/2015  . Breast cancer of upper-outer quadrant of left female breast ([place]) 09/07/2015    Past Surgical History:  Procedure Laterality Date  . BREAST LUMPECTOMY WITH RADIOACTIVE SEED AND SENTINEL LYMPH NODE BIOPSY Left 02/13/2016   Procedure: BREAST LUMPECTOMY WITH RADIOACTIVE SEED AND SENTINEL LYMPH NODE BIOPSY;  Surgeon: Excell Seltzer, MD;  Location: [facility];  Service: General;  Laterality: Left;  . BREAST REDUCTION SURGERY Bilateral 02/21/2016   Procedure: MAMMARY REDUCTION  (BREAST)/Oncoplastic breast reconstruction;  Surgeon: Irene Limbo, MD;  Location: [facility];  Service: Plastics;  Laterality: Bilateral;  . CARDIAC CATHETERIZATION [name] 01/24/2016   Procedure: Left Heart Cath and Coronary Angiography;  Surgeon: Leonie Man, MD;  Location: [facility];  Service: Cardiovascular;  Laterality: [name];  . CESAREAN SECTION    . CHOLECYSTECTOMY    . IR FLUORO GUIDE CV LINE RIGHT   02/11/2017  . IR FLUORO GUIDE PORT INSERTION RIGHT  03/19/2017  . IR US GUIDE VASC ACCESS RIGHT  02/11/2017  . IR US GUIDE VASC ACCESS RIGHT  03/19/2017  . PERIPHERAL VASCULAR CATHETERIZATION Right 02/13/2016   Procedure: PORTA CATH REMOVAL;  Surgeon: Excell Seltzer, MD;  Location: [facility];  Service: General;  Laterality: Right;  . PORTACATH PLACEMENT [name] 09/12/2015   Procedure: INSERTION PORT-A-CATH;  Surgeon: Excell Seltzer, MD;  Location: [facility];  Service: General;  Laterality: [name];    OB History    Gravida Para Term Preterm AB Living   0 0 0 0 0     SAB TAB Ectopic Multiple Live Births   0 0 0           Home Medications    Prior to Admission medications   Medication Sig Start Date End Date Taking? Authorizing Provider  albuterol (PROVENTIL HFA;VENTOLIN HFA) 108 (90 Base) MCG/ACT inhaler Inhale 1-2 puffs into the lungs every 6 (six) hours as needed for wheezing or shortness of breath. 07/24/16  Yes Waynetta Pean, PA-C  albuterol (PROVENTIL) (2.5 MG/3ML) 0.083% nebulizer solution Take 2.5 mg by nebulization every 6 (six) hours as needed for wheezing or shortness of breath.   Yes [provider]  amiodarone (PACERONE) 200 MG tablet Take 1 tablet (200 mg total) by mouth daily. 04/27/17  Yes Patrecia Pour, Christean Grief, MD  cholecalciferol (VITAMIN D) 1000 units tablet Take 1,000 Units by mouth daily.   Yes [provider]  digoxin (LANOXIN) 0.125 MG tablet Take 1 tablet (0.125 mg total) by mouth daily. 05/04/17  Yes Robbie Lis, MD  enoxaparin (LOVENOX) 100 MG/ML injection Inject 1 mL (100 mg total) into the skin every 12 (twelve) hours. 02/26/17  Yes Hosie Poisson, MD  fentaNYL (DURAGESIC - DOSED MCG/HR) 50 MCG/HR Place 1 patch (50 mcg total) onto the skin every 3 (three) days. 04/27/17  Yes Patrecia Pour, Christean Grief, MD  ferrous sulfate 325 (65 FE) MG EC tablet Take 325 mg by mouth daily with breakfast.    Yes [provider]  furosemide (LASIX) 40 MG  tablet Take 3 tablets (120 mg total) by mouth 2 (two) times daily. 04/27/17  Yes Patrecia Pour, Christean Grief, MD  guaiFENesin-dextromethorphan [facility] DM) 100-10 MG/5ML syrup Take 5 mLs by mouth every 4 (four) hours as needed for cough. 02/26/17  Yes Hosie Poisson, MD  lactulose (CHRONULAC) 10 GM/15ML solution Take 30 mLs (20 g total) by mouth 2 (two) times daily as needed for mild constipation or moderate constipation. 04/27/17  Yes Patrecia Pour, Christean Grief, MD  Melatonin 10 MG TABS Take 10 mg by mouth at bedtime.    Yes [provider]  methocarbamol (ROBAXIN) 500 MG tablet Take 1 tablet (500 mg total) by mouth every 6 (six) hours as needed for muscle spasms. 04/27/17  Yes Patrecia Pour, Christean Grief, MD  metoprolol succinate (TOPROL-XL) 25 MG 24 hr tablet Take 0.5 tablets (12.5 mg total) by mouth daily. 04/28/17  Yes Patrecia Pour, Christean Grief, MD  morphine (MSIR) 30 MG tablet Take 1 tablet (30 mg total) by mouth every 4 (four) hours  as needed for moderate pain or severe pain. 04/27/17  Yes Patrecia Pour, Christean Grief, MD  ondansetron (ZOFRAN) 8 MG tablet Take 1 tablet (8 mg total) by mouth every 8 (eight) hours as needed (Nausea or vomiting). 04/27/17  Yes Patrecia Pour, Christean Grief, MD  polyethylene glycol [facility] Floria Raveling) packet Take 17 g by mouth 2 (two) times daily. 04/27/17  Yes Patrecia Pour, Christean Grief, MD  potassium chloride SA (K-DUR,KLOR-CON) 20 MEQ tablet Take 2 tablets (40 mEq total) by mouth daily. Patient taking differently: Take 20 mEq by mouth 2 (two) times daily.  04/27/17  Yes Patrecia Pour, Christean Grief, MD  senna (SENOKOT) 8.6 MG TABS tablet Take 1 tablet by mouth daily as needed for mild constipation.   Yes [provider]  predniSONE (DELTASONE) 5 MG tablet Take 2 tablet daily for 5 days, then take 1 tablet daily for 5 days, then take half a tablet daily for 7 days Patient not taking: Reported on 05/08/2017 04/27/17   Doreatha Lew, MD    Family History Family History  Problem Relation Age of Onset  . Lung  cancer Mother 29       2 different types of lung cancer; metastasis to brain; smoker  . Other Mother        hx of hysterectomy for unspecified reason  . Bone cancer Maternal Uncle        dx. early 60s  . Breast cancer Maternal Grandmother        dx. early 75s, s/p mastectomy  . Cancer Paternal Grandfather        unspecified type of cancer, dx. late 22s  . Congestive Heart Failure Father        smoker  . Stroke Father   . Eczema Father   . Cirrhosis Maternal Grandfather   . Heart Problems Maternal Grandfather   . Asthma Daughter   . Allergies Daughter        hives  . Lung cancer Other        (maternal great uncle; MGM's brother); had a coal stove  . Cancer Cousin        unspecified type; d. early age (paternal first cousin once-removed)  . Cancer Cousin        dx. as a kid; in remission today; (paternal 2nd cousin)  . Cancer Cousin        unspecified type; d. early 8s; (maternal 1st cousin)    Social History Social History  Substance Use Topics  . Smoking status: Never Smoker  . Smokeless tobacco: Never Used  . Alcohol use No     Allergies   Dilaudid [hydromorphone hcl]; Ivp dye [iodinated diagnostic agents]; Percocet [oxycodone-acetaminophen]; Tape; and Toradol [ketorolac tromethamine]   Review of Systems Review of Systems  Constitutional: Negative for chills and fever.  HENT: Negative for congestion and rhinorrhea.   Eyes: Negative for redness and visual disturbance.  Respiratory: Positive for shortness of breath. Negative for wheezing.   Cardiovascular: Positive for leg swelling. Negative for chest pain and palpitations.  Gastrointestinal: Negative for nausea and vomiting.  Genitourinary: Negative for dysuria and urgency.  Musculoskeletal: Negative for arthralgias and myalgias.  Skin: Negative for pallor and wound.  Neurological: Negative for dizziness and headaches.     Physical Exam Updated Vital Signs BP 129/77 (BP Location: Right Arm)   Pulse 94    Temp 97.8 F (36.6 C) (Oral)   Resp 18   Ht 5\' 9"  (1.753 m)   Wt 83 kg (183 lb)  SpO2 96% Comment: 2 L   BMI 27.02 kg/m   Physical Exam  Constitutional: She is oriented to person, place, and time. She appears well-developed and well-nourished. No distress.  HENT:  Head: Normocephalic and atraumatic.  Eyes: Pupils are equal, round, and reactive to light. EOM are normal.  Neck: Normal range of motion. Neck supple.  Cardiovascular: Normal rate and regular rhythm.  Exam reveals no gallop and no friction rub.   No murmur heard. Pulmonary/Chest: Effort normal. She has no wheezes. She has no rales.  Diminished breath sounds in all lung fields but worse in the right side.  Abdominal: Soft. She exhibits no distension and no mass. There is no tenderness. There is no guarding.  Musculoskeletal: She exhibits edema. She exhibits no tenderness.  3+ edema to the knees bilaterally.  Neurological: She is alert and oriented to person, place, and time.  Skin: Skin is warm and dry. She is not diaphoretic.  Psychiatric: She has a normal mood and affect. Her behavior is normal.  Nursing note and vitals reviewed.    ED Treatments / Results  Labs (all labs ordered are listed, but only abnormal results are displayed) Labs Reviewed  COMPREHENSIVE METABOLIC PANEL - Abnormal; Notable for the following:       Result Value   Sodium 126 (*)    Chloride 87 (*)    Glucose, Bld 113 (*)    BUN 32 (*)    Creatinine, Ser 1.34 (*)    Albumin 2.0 (*)    AST 121 (*)    ALT 126 (*)    GFR calc non Af Amer 46 (*)    GFR calc Af Amer 54 (*)    All other components within normal limits  CBC WITH DIFFERENTIAL/PLATELET - Abnormal; Notable for the following:    WBC 12.9 (*)    RBC 2.92 (*)    Hemoglobin 7.8 (*)    HCT 24.6 (*)    RDW 18.4 (*)    Platelets 478 (*)    Neutro Abs 10.0 (*)    Monocytes Absolute 1.7 (*)    All other components within normal limits  PROTIME-INR - Abnormal; Notable for the  following:    Prothrombin Time 15.3 (*)    All other components within normal limits  POCT I-STAT, CHEM 8 - Abnormal; Notable for the following:    Sodium 125 (*)    Chloride 86 (*)    BUN 28 (*)    Creatinine, Ser 1.30 (*)    Glucose, Bld 114 (*)    Hemoglobin 8.8 (*)    HCT 26.0 (*)    All other components within normal limits  CULTURE, BLOOD (ROUTINE X 2)  CULTURE, BLOOD (ROUTINE X 2)  BRAIN NATRIURETIC PEPTIDE  URINALYSIS, ROUTINE W REFLEX MICROSCOPIC  DIGOXIN LEVEL  I-STAT CG4 LACTIC ACID, ED  I-STAT TROPONIN, ED  I-STAT CHEM 8, ED  CG4 I-STAT (LACTIC ACID)  POCT I-STAT TROPONIN I    EKG  EKG Interpretation  Date/Time:  Wednesday May 08 2017 21:18:49 EDT Ventricular Rate:  95 PR Interval:    QRS Duration: 87 QT Interval:  388 QTC Calculation: 488 R Axis:   33 Text Interpretation:  Sinus rhythm Borderline prolonged PR interval Low voltage, precordial leads RSR' in V1 or V2, right VCD or RVH Nonspecific T abnormalities, diffuse leads Borderline prolonged QT interval Baseline wander in lead(s) V2 replaced afib Otherwise no significant change Confirmed by Deno Etienne [phone]) on 05/08/2017 9:54:14 PM  Radiology Dg Chest 2 View  Result Date: 05/08/2017 CLINICAL DATA:  Shortness of breath. EXAM: CHEST  2 VIEW COMPARISON:  Radiograph 05/01/2017, chest CT 04/12/2017 FINDINGS: Tip of the right chest port in the SVC. Lower lung volumes from prior exam. Multifocal pulmonary metastatic disease on the hilar adenopathy as seen on prior chest CT. Heart size and mediastinal contours are grossly stable per Right pleural effusion has increased from prior. No pneumothorax. IMPRESSION: 1. Increased right pleural effusion from prior exam. 2. Pulmonary metastatic disease and hilar adenopathy, grossly stable in radiographic appearance from prior. Electronically Signed   By: Jeb Levering M.D.   On: 05/08/2017 22:08    Procedures Procedures (including critical care  time)  Medications Ordered in ED Medications  potassium chloride SA (K-DUR,KLOR-CON) CR tablet 40 mEq (not administered)  furosemide (LASIX) injection 40 mg (not administered)  morphine 4 MG/ML injection 4 mg (not administered)  ondansetron (ZOFRAN) injection 4 mg (not administered)  ipratropium-albuterol (DUONEB) 0.5-2.5 (3) MG/3ML nebulizer solution 3 mL (not administered)     Initial Impression / Assessment and Plan / ED Course  I have reviewed the triage vital signs and the nursing notes.  Pertinent labs & imaging results that were available during my care of the patient were reviewed by me and considered in my medical decision making (see chart for details).     27 yoF with a chief complaint of shortness of breath. This is an ongoing issue for her but acutely worsening over the past few hours. Has a history of CHF with an EF in the 43s. Also is undergoing palliative chemotherapy for breast cancer. Chest x-ray with worsening right-sided pleural effusion.    I suspect this is likely the cause of her SOB. Patient unable to even recline in bed due to difficulty breathing.  Will give lasix.  I doubt PE with ongoing lovenox therapy.    The patients results and plan were reviewed and discussed.   Any x-rays performed were independently reviewed by myself.   Differential diagnosis were considered with the presenting HPI.  Medications  potassium chloride SA (K-DUR,KLOR-CON) CR tablet 40 mEq (not administered)  furosemide (LASIX) injection 40 mg (40 mg Intravenous Given 05/08/17 2354)  morphine 4 MG/ML injection 4 mg (4 mg Intravenous Given 05/08/17 2355)  ondansetron (ZOFRAN) injection 4 mg (4 mg Intravenous Given 05/08/17 2355)  ipratropium-albuterol (DUONEB) 0.5-2.5 (3) MG/3ML nebulizer solution 3 mL (3 mLs Nebulization Given 05/08/17 2321)    Vitals:   05/08/17 2109 05/08/17 2120 05/08/17 2314 05/08/17 2321  BP: 129/77  130/81   Pulse: 94  90   Resp: 18  20   Temp: 97.8 F (36.6 C)      TempSrc: Oral     SpO2: 90% 96% 93% 94%  Weight:  83 kg (183 lb)    Height:  5\' 9"  (1.753 m)      Final diagnoses:  Peripheral edema  Pleural effusion    Admission/ observation were discussed with the admitting physician, patient and/or family and they are comfortable with the plan.    Final Clinical Impressions(s) / ED Diagnoses   Final diagnoses:  Peripheral edema  Pleural effusion    New Prescriptions New Prescriptions   No medications on file     Deno Etienne, DO 05/09/17 0008

## 2017-05-09 ENCOUNTER — Inpatient Hospital Stay (HOSPITAL_COMMUNITY)
Admission: RE | Admit: 2017-05-09 | Payer: Federal, State, Local not specified - PPO | Source: Ambulatory Visit | Admitting: Internal Medicine

## 2017-05-09 ENCOUNTER — Inpatient Hospital Stay (HOSPITAL_COMMUNITY): Payer: Federal, State, Local not specified - PPO

## 2017-05-09 ENCOUNTER — Encounter (HOSPITAL_COMMUNITY): Payer: Self-pay | Admitting: Radiology

## 2017-05-09 DIAGNOSIS — Z9049 Acquired absence of other specified parts of digestive tract: Secondary | ICD-10-CM | POA: Diagnosis not present

## 2017-05-09 DIAGNOSIS — C7801 Secondary malignant neoplasm of right lung: Secondary | ICD-10-CM | POA: Diagnosis not present

## 2017-05-09 DIAGNOSIS — Z515 Encounter for palliative care: Secondary | ICD-10-CM | POA: Diagnosis not present

## 2017-05-09 DIAGNOSIS — R609 Edema, unspecified: Secondary | ICD-10-CM | POA: Diagnosis present

## 2017-05-09 DIAGNOSIS — C7802 Secondary malignant neoplasm of left lung: Secondary | ICD-10-CM | POA: Diagnosis not present

## 2017-05-09 DIAGNOSIS — Z7901 Long term (current) use of anticoagulants: Secondary | ICD-10-CM | POA: Diagnosis not present

## 2017-05-09 DIAGNOSIS — J9601 Acute respiratory failure with hypoxia: Secondary | ICD-10-CM | POA: Diagnosis present

## 2017-05-09 DIAGNOSIS — D649 Anemia, unspecified: Secondary | ICD-10-CM

## 2017-05-09 DIAGNOSIS — C50919 Malignant neoplasm of unspecified site of unspecified female breast: Secondary | ICD-10-CM | POA: Diagnosis not present

## 2017-05-09 DIAGNOSIS — K219 Gastro-esophageal reflux disease without esophagitis: Secondary | ICD-10-CM | POA: Diagnosis present

## 2017-05-09 DIAGNOSIS — Z8249 Family history of ischemic heart disease and other diseases of the circulatory system: Secondary | ICD-10-CM | POA: Diagnosis not present

## 2017-05-09 DIAGNOSIS — J9690 Respiratory failure, unspecified, unspecified whether with hypoxia or hypercapnia: Secondary | ICD-10-CM | POA: Diagnosis not present

## 2017-05-09 DIAGNOSIS — G9340 Encephalopathy, unspecified: Secondary | ICD-10-CM | POA: Diagnosis present

## 2017-05-09 DIAGNOSIS — C7951 Secondary malignant neoplasm of bone: Secondary | ICD-10-CM | POA: Diagnosis present

## 2017-05-09 DIAGNOSIS — C771 Secondary and unspecified malignant neoplasm of intrathoracic lymph nodes: Secondary | ICD-10-CM

## 2017-05-09 DIAGNOSIS — I5023 Acute on chronic systolic (congestive) heart failure: Secondary | ICD-10-CM | POA: Diagnosis present

## 2017-05-09 DIAGNOSIS — I4892 Unspecified atrial flutter: Secondary | ICD-10-CM | POA: Diagnosis present

## 2017-05-09 DIAGNOSIS — C50412 Malignant neoplasm of upper-outer quadrant of left female breast: Secondary | ICD-10-CM | POA: Diagnosis not present

## 2017-05-09 DIAGNOSIS — I420 Dilated cardiomyopathy: Secondary | ICD-10-CM | POA: Diagnosis present

## 2017-05-09 DIAGNOSIS — G43909 Migraine, unspecified, not intractable, without status migrainosus: Secondary | ICD-10-CM | POA: Diagnosis present

## 2017-05-09 DIAGNOSIS — Z823 Family history of stroke: Secondary | ICD-10-CM | POA: Diagnosis not present

## 2017-05-09 DIAGNOSIS — I509 Heart failure, unspecified: Secondary | ICD-10-CM | POA: Diagnosis not present

## 2017-05-09 DIAGNOSIS — C7800 Secondary malignant neoplasm of unspecified lung: Secondary | ICD-10-CM | POA: Diagnosis present

## 2017-05-09 DIAGNOSIS — I480 Paroxysmal atrial fibrillation: Secondary | ICD-10-CM | POA: Diagnosis present

## 2017-05-09 DIAGNOSIS — Z7189 Other specified counseling: Secondary | ICD-10-CM | POA: Diagnosis not present

## 2017-05-09 DIAGNOSIS — Z91041 Radiographic dye allergy status: Secondary | ICD-10-CM | POA: Diagnosis not present

## 2017-05-09 DIAGNOSIS — C787 Secondary malignant neoplasm of liver and intrahepatic bile duct: Secondary | ICD-10-CM | POA: Diagnosis present

## 2017-05-09 DIAGNOSIS — R64 Cachexia: Secondary | ICD-10-CM | POA: Diagnosis present

## 2017-05-09 DIAGNOSIS — R52 Pain, unspecified: Secondary | ICD-10-CM | POA: Diagnosis not present

## 2017-05-09 DIAGNOSIS — Z801 Family history of malignant neoplasm of trachea, bronchus and lung: Secondary | ICD-10-CM | POA: Diagnosis not present

## 2017-05-09 DIAGNOSIS — C7931 Secondary malignant neoplasm of brain: Secondary | ICD-10-CM | POA: Diagnosis present

## 2017-05-09 DIAGNOSIS — Z803 Family history of malignant neoplasm of breast: Secondary | ICD-10-CM | POA: Diagnosis not present

## 2017-05-09 DIAGNOSIS — Z825 Family history of asthma and other chronic lower respiratory diseases: Secondary | ICD-10-CM | POA: Diagnosis not present

## 2017-05-09 DIAGNOSIS — Z808 Family history of malignant neoplasm of other organs or systems: Secondary | ICD-10-CM | POA: Diagnosis not present

## 2017-05-09 DIAGNOSIS — Z853 Personal history of malignant neoplasm of breast: Secondary | ICD-10-CM | POA: Diagnosis not present

## 2017-05-09 DIAGNOSIS — G893 Neoplasm related pain (acute) (chronic): Secondary | ICD-10-CM | POA: Diagnosis present

## 2017-05-09 LAB — URINALYSIS, ROUTINE W REFLEX MICROSCOPIC
Bilirubin Urine: NEGATIVE
Glucose, UA: NEGATIVE mg/dL
Hgb urine dipstick: NEGATIVE
Ketones, ur: NEGATIVE mg/dL
LEUKOCYTES UA: NEGATIVE
NITRITE: NEGATIVE
PROTEIN: NEGATIVE mg/dL
Specific Gravity, Urine: 1.01 (ref 1.005–1.030)
pH: 5 (ref 5.0–8.0)

## 2017-05-09 LAB — BLOOD GAS, ARTERIAL
ACID-BASE EXCESS: 5.6 mmol/L — AB (ref 0.0–2.0)
BICARBONATE: 30.3 mmol/L — AB (ref 20.0–28.0)
DRAWN BY: 51133
O2 Content: 2 L/min
O2 Saturation: 90.6 %
PATIENT TEMPERATURE: 98.6
PH ART: 7.423 (ref 7.350–7.450)
pCO2 arterial: 47.1 mmHg (ref 32.0–48.0)
pO2, Arterial: 65.8 mmHg — ABNORMAL LOW (ref 83.0–108.0)

## 2017-05-09 LAB — BASIC METABOLIC PANEL
Anion gap: 12 (ref 5–15)
BUN: 32 mg/dL — ABNORMAL HIGH (ref 6–20)
CHLORIDE: 90 mmol/L — AB (ref 101–111)
CO2: 30 mmol/L (ref 22–32)
CREATININE: 1.33 mg/dL — AB (ref 0.44–1.00)
Calcium: 9.6 mg/dL (ref 8.9–10.3)
GFR, EST AFRICAN AMERICAN: 54 mL/min — AB (ref >60)
GFR, EST NON AFRICAN AMERICAN: 47 mL/min — AB (ref >60)
Glucose, Bld: 109 mg/dL — ABNORMAL HIGH (ref 65–99)
POTASSIUM: 3.9 mmol/L (ref 3.5–5.1)
SODIUM: 132 mmol/L — AB (ref 135–145)

## 2017-05-09 LAB — ABO/RH: ABO/RH(D): O POS

## 2017-05-09 LAB — CBC
HEMATOCRIT: 24.1 % — AB (ref 36.0–46.0)
Hemoglobin: 7.7 g/dL — ABNORMAL LOW (ref 12.0–15.0)
MCH: 26.5 pg (ref 26.0–34.0)
MCHC: 32 g/dL (ref 30.0–36.0)
MCV: 82.8 fL (ref 78.0–100.0)
PLATELETS: 490 10*3/uL — AB (ref 150–400)
RBC: 2.91 MIL/uL — AB (ref 3.87–5.11)
RDW: 18.1 % — AB (ref 11.5–15.5)
WBC: 12.3 10*3/uL — AB (ref 4.0–10.5)

## 2017-05-09 LAB — DIGOXIN LEVEL: Digoxin Level: 1.6 ng/mL (ref 0.8–2.0)

## 2017-05-09 LAB — PREPARE RBC (CROSSMATCH)

## 2017-05-09 MED ORDER — FERROUS SULFATE 325 (65 FE) MG PO TABS
325.0000 mg | ORAL_TABLET | Freq: Every day | ORAL | Status: DC
Start: 2017-05-09 — End: 2017-05-14
  Administered 2017-05-09 – 2017-05-14 (×6): 325 mg via ORAL
  Filled 2017-05-09 (×6): qty 1

## 2017-05-09 MED ORDER — SODIUM CHLORIDE 0.9 % IV SOLN: Freq: Once | INTRAVENOUS | Status: DC

## 2017-05-09 MED ORDER — ACETAMINOPHEN 325 MG PO TABS: 650.0000 mg | ORAL_TABLET | Freq: Four times a day (QID) | ORAL | Status: DC | PRN

## 2017-05-09 MED ORDER — ONDANSETRON HCL 4 MG PO TABS: 4.0000 mg | ORAL_TABLET | Freq: Four times a day (QID) | ORAL | Status: DC | PRN

## 2017-05-09 MED ORDER — FUROSEMIDE 10 MG/ML IJ SOLN
80.0000 mg | Freq: Two times a day (BID) | INTRAMUSCULAR | Status: DC
  Administered 2017-05-09 – 2017-05-10 (×4): 80 mg via INTRAVENOUS
  Filled 2017-05-09 (×5): qty 8

## 2017-05-09 MED ORDER — ALBUTEROL SULFATE (2.5 MG/3ML) 0.083% IN NEBU: 2.5000 mg | INHALATION_SOLUTION | Freq: Four times a day (QID) | RESPIRATORY_TRACT | Status: DC | PRN

## 2017-05-09 MED ORDER — GUAIFENESIN-DM 100-10 MG/5ML PO SYRP: 5.0000 mL | ORAL_SOLUTION | ORAL | Status: DC | PRN

## 2017-05-09 MED ORDER — FUROSEMIDE 10 MG/ML IJ SOLN: 120.0000 mg | Freq: Two times a day (BID) | INTRAVENOUS | Status: DC

## 2017-05-09 MED ORDER — METOPROLOL SUCCINATE 12.5 MG HALF TABLET
12.5000 mg | ORAL_TABLET | Freq: Every day | ORAL | Status: DC
  Administered 2017-05-09 – 2017-05-14 (×6): 12.5 mg via ORAL
  Filled 2017-05-09 (×6): qty 1

## 2017-05-09 MED ORDER — ENOXAPARIN SODIUM 100 MG/ML ~~LOC~~ SOLN
1.0000 mg/kg | Freq: Two times a day (BID) | SUBCUTANEOUS | Status: DC
  Administered 2017-05-09 – 2017-05-14 (×11): 90 mg via SUBCUTANEOUS
  Filled 2017-05-09 (×11): qty 1

## 2017-05-09 MED ORDER — ACETAMINOPHEN 650 MG RE SUPP: 650.0000 mg | Freq: Four times a day (QID) | RECTAL | Status: DC | PRN

## 2017-05-09 MED ORDER — PREMIER PROTEIN SHAKE
11.0000 [oz_av] | ORAL | Status: DC
  Administered 2017-05-11 – 2017-05-13 (×3): 11 [oz_av] via ORAL
  Filled 2017-05-09 (×7): qty 325.31

## 2017-05-09 MED ORDER — LACTULOSE 10 GM/15ML PO SOLN: 20.0000 g | Freq: Two times a day (BID) | ORAL | Status: DC | PRN

## 2017-05-09 MED ORDER — SODIUM CHLORIDE 0.9% FLUSH
10.0000 mL | Freq: Two times a day (BID) | INTRAVENOUS | Status: DC
  Administered 2017-05-10 – 2017-05-11 (×2): 10 mL

## 2017-05-09 MED ORDER — VITAMIN D3 25 MCG (1000 UNIT) PO TABS
1000.0000 [IU] | ORAL_TABLET | Freq: Every day | ORAL | Status: DC
  Administered 2017-05-09 – 2017-05-14 (×6): 1000 [IU] via ORAL
  Filled 2017-05-09 (×6): qty 1

## 2017-05-09 MED ORDER — DIGOXIN 125 MCG PO TABS
0.1250 mg | ORAL_TABLET | Freq: Every day | ORAL | Status: DC
  Administered 2017-05-09 – 2017-05-14 (×6): 0.125 mg via ORAL
  Filled 2017-05-09 (×6): qty 1

## 2017-05-09 MED ORDER — SODIUM CHLORIDE 0.9% FLUSH: 10.0000 mL | INTRAVENOUS | Status: DC | PRN

## 2017-05-09 MED ORDER — ADULT MULTIVITAMIN LIQUID CH
15.0000 mL | Freq: Every day | ORAL | Status: DC
  Administered 2017-05-09 – 2017-05-14 (×6): 15 mL via ORAL
  Filled 2017-05-09 (×6): qty 15

## 2017-05-09 MED ORDER — MORPHINE SULFATE (PF) 10 MG/ML IV SOLN
8.0000 mg | INTRAVENOUS | Status: DC | PRN
  Administered 2017-05-09 – 2017-05-10 (×6): 8 mg via INTRAVENOUS
  Filled 2017-05-09 (×6): qty 1

## 2017-05-09 MED ORDER — ONDANSETRON HCL 4 MG/2ML IJ SOLN
4.0000 mg | Freq: Four times a day (QID) | INTRAMUSCULAR | Status: DC | PRN
Start: 2017-05-09 — End: 2017-05-14
  Administered 2017-05-12: 4 mg via INTRAVENOUS
  Filled 2017-05-09: qty 2

## 2017-05-09 MED ORDER — ORAL CARE MOUTH RINSE
15.0000 mL | Freq: Two times a day (BID) | OROMUCOSAL | Status: DC
  Administered 2017-05-09 – 2017-05-13 (×7): 15 mL via OROMUCOSAL

## 2017-05-09 MED ORDER — SENNA 8.6 MG PO TABS
1.0000 | ORAL_TABLET | Freq: Every day | ORAL | Status: DC | PRN
  Administered 2017-05-14: 8.6 mg via ORAL
  Filled 2017-05-09: qty 1

## 2017-05-09 MED ORDER — POTASSIUM CHLORIDE CRYS ER 20 MEQ PO TBCR
20.0000 meq | EXTENDED_RELEASE_TABLET | Freq: Two times a day (BID) | ORAL | Status: DC
  Administered 2017-05-09 – 2017-05-14 (×11): 20 meq via ORAL
  Filled 2017-05-09 (×12): qty 1

## 2017-05-09 MED ORDER — AMIODARONE HCL 200 MG PO TABS
200.0000 mg | ORAL_TABLET | Freq: Every day | ORAL | Status: DC
  Administered 2017-05-09 – 2017-05-14 (×6): 200 mg via ORAL
  Filled 2017-05-09 (×6): qty 1

## 2017-05-09 MED ORDER — FENTANYL 50 MCG/HR TD PT72
50.0000 ug | MEDICATED_PATCH | TRANSDERMAL | Status: DC
  Administered 2017-05-11: 50 ug via TRANSDERMAL
  Filled 2017-05-09: qty 1
  Filled 2017-05-09: qty 2

## 2017-05-09 MED ORDER — POLYETHYLENE GLYCOL 3350 17 G PO PACK
17.0000 g | PACK | Freq: Two times a day (BID) | ORAL | Status: DC
  Administered 2017-05-09 – 2017-05-14 (×6): 17 g via ORAL
  Filled 2017-05-09 (×9): qty 1

## 2017-05-09 MED ORDER — MELATONIN 10 MG PO TABS: 10.0000 mg | ORAL_TABLET | Freq: Every day | ORAL | Status: DC

## 2017-05-09 MED ORDER — ALBUTEROL SULFATE (2.5 MG/3ML) 0.083% IN NEBU: 3.0000 mL | INHALATION_SOLUTION | Freq: Four times a day (QID) | RESPIRATORY_TRACT | Status: DC | PRN

## 2017-05-09 MED ORDER — ENOXAPARIN SODIUM 100 MG/ML ~~LOC~~ SOLN: 85.0000 mg | Freq: Two times a day (BID) | SUBCUTANEOUS | Status: DC

## 2017-05-09 MED ORDER — ALPRAZOLAM 0.5 MG PO TABS
0.5000 mg | ORAL_TABLET | Freq: Three times a day (TID) | ORAL | Status: DC | PRN
  Administered 2017-05-09 – 2017-05-14 (×3): 0.5 mg via ORAL
  Filled 2017-05-09 (×3): qty 1

## 2017-05-09 MED ORDER — MORPHINE SULFATE 15 MG PO TABS: 30.0000 mg | ORAL_TABLET | ORAL | Status: DC | PRN

## 2017-05-09 MED ORDER — METHOCARBAMOL 500 MG PO TABS
500.0000 mg | ORAL_TABLET | Freq: Four times a day (QID) | ORAL | Status: DC | PRN
  Administered 2017-05-12 – 2017-05-14 (×3): 500 mg via ORAL
  Filled 2017-05-09 (×3): qty 1

## 2017-05-09 NOTE — Progress Notes (Signed)
Initial Nutrition Assessment  DOCUMENTATION CODES:   Not applicable  INTERVENTION:   -Provide MVI daily  -Provide Premier Protein Q24, each supplement provides 160kcal and 30g protein.   -Magic cup BID with meals, each supplement provides 290 kcal and 9 grams of protein  -Recommend increasing premier protein to BID, if fluid restriction discontinued.   NUTRITION DIAGNOSIS:   Increased nutrient needs related to cancer and cancer related treatments as evidenced by estimated needs.  GOAL:   Patient will meet greater than or equal to 90% of their needs  MONITOR:   PO intake, Supplement acceptance, Labs, Weight trends  REASON FOR ASSESSMENT:   Malnutrition Screening Tool    ASSESSMENT:   Pt with PMH of metastatic breast cancer with brain/lung metastases (on oral chemotherapy) and  cardiomyopathy.   Recently admitted 8/16 for acute encephalopathy suspected to be from digoxin toxicity. Presents this admission with acute respiratory failure with hypoxia.   Spoke with pt at bedside. Pt has persistent cough making it hard to speak. Reports having a decrease in appetite related to her cough. Denies nausea/vomiting. Could not elaborate on daily intake, but states she is trying to drink ensures. Pt is currently on a 1200 ml fluid restriction. RD to provide magic cup BID in hopes they are easier to swallow.   Records pt has lost 8% of body wt in two months. This is significant, though hard to tell how much is actual dry wt loss.  Will monitor wt trends this hospital stay. Nutrition-Focused physical exam completed. Findings are no fat depletion, no muscle depletion, and mild edema in BLE. Pt does not meet criteria for malnutrition at this time. If decreased PO intake and wt loss persist, pt is at high risk for malnutrition.   Medications reviewed and include: Vit D, fentanyl, ferrous sulfate, lasix, KCl Labs reviewed: Na 132 (L) Cl 90 (L) BUN 32 (H) Creatinine 1.33 (H) Albumin 2.0 (L) AST  121 (H) ALT 126 (H)   Diet Order:  Diet Heart Room service appropriate? Yes; Fluid consistency: Thin; Fluid restriction: 1200 mL Fluid  Skin:  Reviewed, no issues  Last BM:  05/08/17  Height:   Ht Readings from Last 1 Encounters:  05/09/17 5\' 9"  (1.753 m)    Weight:   Wt Readings from Last 1 Encounters:  05/09/17 194 lb 3.6 oz (88.1 kg)    Ideal Body Weight:  61.4 kg  BMI:  Body mass index is 28.68 kg/m.  Estimated Nutritional Needs:   Kcal:  1900-2100 (31-34 kcal/kg IBW)  Protein:  95-105 grams (1.5-1.7 g/kg IBW)  Fluid:  >1.9 L/day  EDUCATION NEEDS:   No education needs identified at this time  [place], LDN Clinical Nutrition Pager # - [phone]

## 2017-05-09 NOTE — ED Notes (Signed)
Patient transported to CT 

## 2017-05-09 NOTE — Care Management Note (Signed)
Case Management Note  Patient Details  Name: Albertha K. Kirsch MRN: [id number] Date of Birth: [birth year]  Subjective/Objective: 46 y/o f admitted w/Acute resp failure. Readmit 8/29-9/1. From home. Active w/[facility]-HHPT/home 02-has travel tank.                   Action/Plan:d/c plan home   Expected Discharge Date:                  Expected Discharge Plan:  [place]  In-House Referral:     Discharge planning Services  CM Consult  Post Acute Care Choice:  Durable Medical Equipment [facility] active-HHPT/Home [phone]) Choice offered to:     DME Arranged:    DME Agency:     HH Arranged:    [place] Agency:     Status of Service:  In process, will continue to follow  If discussed at Long Length of Stay Meetings, dates discussed:    Additional Comments:  Dessa Phi, RN 05/09/2017, 2:10 PM

## 2017-05-09 NOTE — Progress Notes (Signed)
PROGRESS NOTE    Cindy Bradshaw  ZYS:[id number] DOB: May 26, 1971 DOA: 05/08/2017 PCP: [name], MD  Brief Narrative:Cindy Bradshaw is a 46 y.o. female with history of metastatic breast cancer presents to the ER with worsening shortness of breath over the last few days. Just Dced 9/1, 5th admission in 68months.  Assessment & Plan:   Acute hypoxic respiratory failure -Secondary to progression of pulmonary metastasis, CT scan done overnight on admission shows rapid progression of pulmonary metastasis and adenopathy -I will also continue IV Lasix at this time since patient is intermittently poorly compliant with this at home due to significant symptom burden from her dyspnea  -We'll transfuse 1 unit of PRBC as well since anemia also likely contributing to dyspnea on exertion  Widely metastatic breast cancer -Extensive pulmonary metastasis, bone metastases, liver mets, subcut nodules -MRI done this morning is concerning for cerebellar cystic lesion possibly metastatic and osseous metastasis as well -Patient's prognosis appears to be very poor and functional status is also very poor -This is her fifth admission in 3 months, she has been seen by palliative care during the past 2 hospitalizations and continues to insist on being a full code and requests full scope of medical treatment -She is given a dose of immunotherapy-Pembrolizumab, and a couple of weeks back at [facility] -Ct 9/6 shows rapid progression of mediastinal adenopathy, pulmonary metastasis, liver metastasis, and right breast masses -Case discussed with patient's oncologist Dr. Sonny Dandy who will discuss options with patient today  Anemia -Acute and chronic -Secondary to chronic disease, rapidly progressing malignancy - we'll transfuse 1 unit PRBC today, due to dyspnea  Nonischemic cardiomyopathy/acute on chronic systolic CHF -Ejection fraction 25% -Related to chemotherapy (Adriamycin) -We'll continue IV Lasix  today monitor urine output  History of pulmonary embolism -Continue full dose Lovenox  Paroxysmal atrial fibrillation -Currently in sinus rhythm. Previous episodes of intermittent rapid ventricular response suspect significant pulmonary disease burden from her metastasis contributing to A. fib RVR -Continue current regimen of amiodarone and metoprolol  Chronic pain -Secondary to metastatic cancer -Continue fentanyl patch and oxycodone per home regimen  Code status Full Code Communication: no family at bedside Disposition Plan: to be determined  Consultants:   Oncology Dr. Sonny Dandy   Subjective: Feels very weak and short of breath  subjective: Vitals:   05/08/17 2330 05/09/17 0000 05/09/17 0137 05/09/17 1026  BP: 119/84 124/76 (!) 126/59 109/66  Pulse:   89 93  Resp: 14 (!) 26 (!) 28   Temp:   98.1 F (36.7 C)   TempSrc:   Oral   SpO2: 100% 98% 94%   Weight:   88.1 kg (194 lb 3.6 oz)   Height:   5\' 9"  (1.753 m)     Intake/Output Summary (Last 24 hours) at 05/09/17 1355 Last data filed at 05/09/17 0942  Gross per 24 hour  Intake              360 ml  Output              700 ml  Net             -340 ml   Filed Weights   05/08/17 2120 05/09/17 0137  Weight: 83 kg (183 lb) 88.1 kg (194 lb 3.6 oz)    Examination:  General exam: Chronically ill female appears much older than stated age uncomfortable, Respiratory system: Poor air movement, scattered rhonchi Cardiovascular system: S1 & S2 heard, RRR Gastrointestinal system: Abdomen is nondistended, soft  and nontender. Normal bowel sounds heard. Central nervous system: Awake and oriented, mild cognitive delays noted, no overt focal deficits appreciated at this time Skin: No rashes, lesions or ulcers Psychiatry: Flat affect    Data Reviewed:   CBC:  Recent Labs Lab 05/04/17 0535 05/08/17 2209 05/08/17 2228 05/09/17 0523  WBC 12.8* 12.9*  --  12.3*  NEUTROABS  --  10.0*  --   --   HGB 8.0* 7.8* 8.8* 7.7*    HCT 25.6* 24.6* 26.0* 24.1*  MCV 86.5 84.2  --  82.8  PLT 509* 478*  --  831*   Basic Metabolic Panel:  Recent Labs Lab 05/04/17 0535 05/08/17 2209 05/08/17 2228 05/09/17 0523  NA 133* 126* 125* 132*  K 3.9 3.9 3.9 3.9  CL 94* 87* 86* 90*  CO2 28 28  --  30  GLUCOSE 88 113* 114* 109*  BUN 19 32* 28* 32*  CREATININE 0.70 1.34* 1.30* 1.33*  CALCIUM 9.8 9.4  --  9.6   GFR: Estimated Creatinine Clearance: 61.9 mL/min (A) (by C-G formula based on SCr of 1.33 mg/dL (H)). Liver Function Tests:  Recent Labs Lab 05/08/17 2209  AST 121*  ALT 126*  ALKPHOS 78  BILITOT 0.8  PROT 6.9  ALBUMIN 2.0*   No results for input(s): LIPASE, AMYLASE in the last 168 hours. No results for input(s): AMMONIA in the last 168 hours. Coagulation Profile:  Recent Labs Lab 05/08/17 2209  INR 1.22   Cardiac Enzymes: No results for input(s): CKTOTAL, CKMB, CKMBINDEX, TROPONINI in the last 168 hours. BNP (last 3 results) No results for input(s): PROBNP in the last 8760 hours. HbA1C: No results for input(s): HGBA1C in the last 72 hours. CBG: No results for input(s): GLUCAP in the last 168 hours. Lipid Profile: No results for input(s): CHOL, HDL, LDLCALC, TRIG, CHOLHDL, LDLDIRECT in the last 72 hours. Thyroid Function Tests: No results for input(s): TSH, T4TOTAL, FREET4, T3FREE, THYROIDAB in the last 72 hours. Anemia Panel: No results for input(s): VITAMINB12, FOLATE, FERRITIN, TIBC, IRON, RETICCTPCT in the last 72 hours. Urine analysis:    Component Value Date/Time   COLORURINE YELLOW 05/09/2017 0942   APPEARANCEUR CLEAR 05/09/2017 0942   LABSPEC 1.010 05/09/2017 0942   LABSPEC 1.010 04/10/2016 1224   PHURINE 5.0 05/09/2017 0942   GLUCOSEU NEGATIVE 05/09/2017 0942   GLUCOSEU Negative 04/10/2016 1224   HGBUR NEGATIVE 05/09/2017 0942   BILIRUBINUR NEGATIVE 05/09/2017 0942   BILIRUBINUR Negative 04/10/2016 1224   KETONESUR NEGATIVE 05/09/2017 0942   PROTEINUR NEGATIVE 05/09/2017  0942   UROBILINOGEN 0.2 04/10/2016 1224   NITRITE NEGATIVE 05/09/2017 0942   LEUKOCYTESUR NEGATIVE 05/09/2017 0942   LEUKOCYTESUR Negative 04/10/2016 1224   Sepsis Labs: @LABRCNTIP (procalcitonin:4,lacticidven:4)  )No results found for this or any previous visit (from the past 240 hour(s)).       Radiology Studies: Dg Chest 2 View  Result Date: 05/08/2017 CLINICAL DATA:  Shortness of breath. EXAM: CHEST  2 VIEW COMPARISON:  Radiograph 05/01/2017, chest CT 04/12/2017 FINDINGS: Tip of the right chest port in the SVC. Lower lung volumes from prior exam. Multifocal pulmonary metastatic disease on the hilar adenopathy as seen on prior chest CT. Heart size and mediastinal contours are grossly stable per Right pleural effusion has increased from prior. No pneumothorax. IMPRESSION: 1. Increased right pleural effusion from prior exam. 2. Pulmonary metastatic disease and hilar adenopathy, grossly stable in radiographic appearance from prior. Electronically Signed   By: Jeb Levering M.D.   On: 05/08/2017 22:08  Ct Chest Wo Contrast  Result Date: 05/09/2017 CLINICAL DATA:  46 y/o F; history of breast cancer with lung and brain metastasis. Chemotherapy and x-ray therapy completed with ongoing immunotherapy. Shortness of breath with chest pain under the left breast. EXAM: CT CHEST WITHOUT CONTRAST TECHNIQUE: Multidetector CT imaging of the chest was performed following the standard protocol without IV contrast. COMPARISON:  04/12/2017 CT of the chest FINDINGS: Cardiovascular: Right port catheter with tip in the right atrium. Normal heart size. No pericardial effusion. Normal caliber thoracic aorta. Mediastinum/Nodes: Diffuse extensive mediastinal adenopathy and right paratracheal, left paratracheal, prevascular, AP window, carinal, and bilateral hilar stations is increased for example a right lower paratracheal node measures 25 x 31 mm, previously 19 x 27 mm and a right cardiophrenic node measuring 47 mm,  previously 33 mm. Normal thyroid gland. Normal thoracic esophagus. Lungs/Pleura: Numerous pulmonary nodules are increased in size for example a nodule in the left lung apex measuring 18 mm, previously 15 mm. Pulmonary metastasis are greatest in the right infrahilar region with air confluent with the hilum, narrowing airways, and extending into the lung base. The Small right pleural effusion is decreased. Upper Abdomen: Marked progression of diffuse hepatic metastasis with several new and enlarging when compared prior CT of chest. Musculoskeletal: Multiple masses in the right breast are increased in size, for example a right lateral breast mass measuring 27 mm, previously 18 mm. IMPRESSION: Rapid progression of mediastinal adenopathy, pulmonary metastasis, liver metastasis, and right breast masses. Electronically Signed   By: Kristine Garbe M.D.   On: 05/09/2017 00:54   Mr Brain Wo Contrast  Result Date: 05/09/2017 CLINICAL DATA:  History of metastatic breast cancer. Patient presents with worsening shortness of breath and acute encephalopathy. EXAM: MRI HEAD WITHOUT CONTRAST TECHNIQUE: Axial DWI, coronal DWI, sagittal T1, axial T2, axial T2 FLAIR sequences were acquired. COMPARISON:  04/12/2017 CT head FINDINGS: Brain: Cystic lesion in right cerebellar hemisphere measuring 11 mm with surrounding T2 FLAIR hyperintense signal abnormality (series 6, image 9 and series 7, image 9). There several additional T2 hyperintense, T1 hypointense, FLAIR hypointense foci scattered throughout the supratentorial brain with surrounding T2 FLAIR signal. There is no reduced diffusion to suggest acute or early subacute infarction. No significant mass effect, effacement of basilar cisterns, or extra-axial collection. Vascular: Normal flow voids. Skull and upper cervical spine: Right posterior temporal bone periosteal lesion the outer cortex measuring 16 x 7 mm (series 6, image 17). Sinuses/Orbits: Negative. Other: None.  IMPRESSION: 1. No acute infarct, acute hemorrhage, or significant mass effect. 2. Cystic lesion in right cerebellar hemisphere measuring 11 mm with surrounding edema, metastasis is suspected. 3. Several additional subcentimeter nonspecific lesions are present in the supratentorial brain which may represent the additional foci of metastasis. Differential includes sequelae of ischemia, demyelination, or prior infectious/inflammatory process. 4. Periosteal lesion in outer cortex of right temporal bone, additional possible metastasis. 5. MRI of the brain or CT of the head with and without contrast is recommended when patient is able. Electronically Signed   By: Kristine Garbe M.D.   On: 05/09/2017 07:03        Scheduled Meds: . amiodarone  200 mg Oral Daily  . cholecalciferol  1,000 Units Oral Daily  . digoxin  0.125 mg Oral Daily  . enoxaparin (LOVENOX) injection  1 mg/kg Subcutaneous Q12H  . [START ON 05/11/2017] fentaNYL  50 mcg Transdermal Q72H  . ferrous sulfate  325 mg Oral Q breakfast  . furosemide  80 mg Intravenous Q12H  .  mouth rinse  15 mL Mouth Rinse BID  . metoprolol succinate  12.5 mg Oral Daily  . multivitamin  15 mL Oral Daily  . polyethylene glycol  17 g Oral BID  . potassium chloride SA  20 mEq Oral BID  . protein supplement shake  11 oz Oral Q24H  . sodium chloride flush  10-40 mL Intracatheter Q12H   Continuous Infusions:   LOS: 0 days    Time spent: 27min    Domenic Polite, MD [facility] Pager [phone]  If 7PM-7AM, please contact night-coverage [email or web address] Password [email or web address] 05/09/2017, 1:55 PM

## 2017-05-09 NOTE — H&P (Addendum)
History and Physical    Cindy Bradshaw JKK:[id number] DOB: [birth year] DOA: 05/08/2017  PCP: [name], MD  Patient coming from: home.  Chief Complaint: shortness of breath.  HPI: Cindy Bradshaw is a 46 y.o. female with history of metastatic breast cancer presents to the ER with worsening shortness of breath over the last few days. Patient was recently admitted for acute encephalopathy suspected to be from digoxin toxicity. Patient denies any chest pain or fever chills or headache.  ED Course: in the ER patient's chest x-ray was showing pleural effusion which was followed by CT chest which shows progression of patient's known metastasis.patient was given Lasix 40 mg IV in the ER suspecting patient's shortness of breath could be from the CHF. On exam patient appears at times confused. But follows commands and is oriented to name and place.  Review of Systems: As per HPI, rest all negative.   Past Medical History:  Diagnosis Date  . Arthritis   . Asthma   . Back disorder    degenerative disk disease  . Brain metastases ([place]) dx'd 2017  . Breast CA ([place]) dx'd 2016   left  . Breast cancer ([place]) dx'd 2017   right   . Cardiomyopathy ([place])    a. Dx 02/2017 - EF 25-30%, felt possibly due to adriamycin +/- tachy mediated from atrial fib/flutter.  Marland Kitchen GERD (gastroesophageal reflux disease)   . Mammogram abnormal 08/24/15   first  . Metastatic cancer to lung ([place]) dx'd 11/2016  . Migraine   . Normal coronary arteries    01/2016 cath  . PAF (paroxysmal atrial fibrillation) ([place])   . Pap smear for cervical cancer screening 08/12/15  . Paroxysmal atrial flutter [facility])     Past Surgical History:  Procedure Laterality Date  . BREAST LUMPECTOMY WITH RADIOACTIVE SEED AND SENTINEL LYMPH NODE BIOPSY Left 02/13/2016   Procedure: BREAST LUMPECTOMY WITH RADIOACTIVE SEED AND SENTINEL LYMPH NODE BIOPSY;  Surgeon: Excell Seltzer, MD;  Location: [place];  Service: General;   Laterality: Left;  . BREAST REDUCTION SURGERY Bilateral 02/21/2016   Procedure: MAMMARY REDUCTION  (BREAST)/Oncoplastic breast reconstruction;  Surgeon: Irene Limbo, MD;  Location: [place];  Service: Plastics;  Laterality: Bilateral;  . CARDIAC CATHETERIZATION [name] 01/24/2016   Procedure: Left Heart Cath and Coronary Angiography;  Surgeon: Leonie Man, MD;  Location: [place] [facility];  Service: Cardiovascular;  Laterality: [name];  . CESAREAN SECTION    . CHOLECYSTECTOMY    . IR FLUORO GUIDE CV LINE RIGHT  02/11/2017  . IR FLUORO GUIDE PORT INSERTION RIGHT  03/19/2017  . IR US GUIDE VASC ACCESS RIGHT  02/11/2017  . IR US GUIDE VASC ACCESS RIGHT  03/19/2017  . PERIPHERAL VASCULAR CATHETERIZATION Right 02/13/2016   Procedure: PORTA CATH REMOVAL;  Surgeon: Excell Seltzer, MD;  Location: [place];  Service: General;  Laterality: Right;  . PORTACATH PLACEMENT [name] 09/12/2015   Procedure: INSERTION PORT-A-CATH;  Surgeon: Excell Seltzer, MD;  Location: [facility];  Service: General;  Laterality: [name];     reports that she has never smoked. She has never used smokeless tobacco. She reports that she does not drink alcohol or use drugs.  Allergies  Allergen Reactions  . Dilaudid [Hydromorphone Hcl] Itching  . Ivp Dye [Iodinated Diagnostic Agents] Itching  . Percocet [Oxycodone-Acetaminophen] Itching  . Tape Itching and Rash    Hypofix causes rash  . Toradol [Ketorolac Tromethamine] Itching    Family History  Problem  Relation Age of Onset  . Lung cancer Mother 76       2 different types of lung cancer; metastasis to brain; smoker  . Other Mother        hx of hysterectomy for unspecified reason  . Bone cancer Maternal Uncle        dx. early 50s  . Breast cancer Maternal Grandmother        dx. early 7s, s/p mastectomy  . Cancer Paternal Grandfather        unspecified type of cancer, dx. late 27s  . Congestive Heart Failure Father        smoker  .  Stroke Father   . Eczema Father   . Cirrhosis Maternal Grandfather   . Heart Problems Maternal Grandfather   . Asthma Daughter   . Allergies Daughter        hives  . Lung cancer Other        (maternal great uncle; MGM's brother); had a coal stove  . Cancer Cousin        unspecified type; d. early age (paternal first cousin once-removed)  . Cancer Cousin        dx. as a kid; in remission today; (paternal 2nd cousin)  . Cancer Cousin        unspecified type; d. early 51s; (maternal 1st cousin)    Prior to Admission medications   Medication Sig Start Date End Date Taking? Authorizing Provider  albuterol (PROVENTIL HFA;VENTOLIN HFA) 108 (90 Base) MCG/ACT inhaler Inhale 1-2 puffs into the lungs every 6 (six) hours as needed for wheezing or shortness of breath. 07/24/16  Yes Waynetta Pean, PA-C  albuterol (PROVENTIL) (2.5 MG/3ML) 0.083% nebulizer solution Take 2.5 mg by nebulization every 6 (six) hours as needed for wheezing or shortness of breath.   Yes [provider]  amiodarone (PACERONE) 200 MG tablet Take 1 tablet (200 mg total) by mouth daily. 04/27/17  Yes Patrecia Pour, Christean Grief, MD  cholecalciferol (VITAMIN D) 1000 units tablet Take 1,000 Units by mouth daily.   Yes [provider]  digoxin (LANOXIN) 0.125 MG tablet Take 1 tablet (0.125 mg total) by mouth daily. 05/04/17  Yes Robbie Lis, MD  enoxaparin (LOVENOX) 100 MG/ML injection Inject 1 mL (100 mg total) into the skin every 12 (twelve) hours. 02/26/17  Yes Hosie Poisson, MD  fentaNYL (DURAGESIC - DOSED MCG/HR) 50 MCG/HR Place 1 patch (50 mcg total) onto the skin every 3 (three) days. 04/27/17  Yes Patrecia Pour, Christean Grief, MD  ferrous sulfate 325 (65 FE) MG EC tablet Take 325 mg by mouth daily with breakfast.    Yes [provider]  furosemide (LASIX) 40 MG tablet Take 3 tablets (120 mg total) by mouth 2 (two) times daily. 04/27/17  Yes Patrecia Pour, Christean Grief, MD  guaiFENesin-dextromethorphan [facility] DM) 100-10  MG/5ML syrup Take 5 mLs by mouth every 4 (four) hours as needed for cough. 02/26/17  Yes Hosie Poisson, MD  lactulose (CHRONULAC) 10 GM/15ML solution Take 30 mLs (20 g total) by mouth 2 (two) times daily as needed for mild constipation or moderate constipation. 04/27/17  Yes Patrecia Pour, Christean Grief, MD  Melatonin 10 MG TABS Take 10 mg by mouth at bedtime.    Yes [provider]  methocarbamol (ROBAXIN) 500 MG tablet Take 1 tablet (500 mg total) by mouth every 6 (six) hours as needed for muscle spasms. 04/27/17  Yes Patrecia Pour, Christean Grief, MD  metoprolol succinate (TOPROL-XL) 25 MG 24 hr tablet Take 0.5  tablets (12.5 mg total) by mouth daily. 04/28/17  Yes Patrecia Pour, Christean Grief, MD  morphine (MSIR) 30 MG tablet Take 1 tablet (30 mg total) by mouth every 4 (four) hours as needed for moderate pain or severe pain. 04/27/17  Yes Patrecia Pour, Christean Grief, MD  ondansetron (ZOFRAN) 8 MG tablet Take 1 tablet (8 mg total) by mouth every 8 (eight) hours as needed (Nausea or vomiting). 04/27/17  Yes Patrecia Pour, Christean Grief, MD  polyethylene glycol [facility] Floria Raveling) packet Take 17 g by mouth 2 (two) times daily. 04/27/17  Yes Patrecia Pour, Christean Grief, MD  potassium chloride SA (K-DUR,KLOR-CON) 20 MEQ tablet Take 2 tablets (40 mEq total) by mouth daily. Patient taking differently: Take 20 mEq by mouth 2 (two) times daily.  04/27/17  Yes Patrecia Pour, Christean Grief, MD  senna (SENOKOT) 8.6 MG TABS tablet Take 1 tablet by mouth daily as needed for mild constipation.   Yes [provider]  predniSONE (DELTASONE) 5 MG tablet Take 2 tablet daily for 5 days, then take 1 tablet daily for 5 days, then take half a tablet daily for 7 days Patient not taking: Reported on 05/08/2017 04/27/17   Doreatha Lew, MD    Physical Exam: Vitals:   05/08/17 2314 05/08/17 2321 05/08/17 2330 05/09/17 0000  BP: 130/81  119/84 124/76  Pulse: 90     Resp: 20  14 (!) 26  Temp:      TempSrc:      SpO2: 93% 94% 100% 98%  Weight:      Height:           Constitutional: moderately built and nourished. Vitals:   05/08/17 2314 05/08/17 2321 05/08/17 2330 05/09/17 0000  BP: 130/81  119/84 124/76  Pulse: 90     Resp: 20  14 (!) 26  Temp:      TempSrc:      SpO2: 93% 94% 100% 98%  Weight:      Height:       Eyes: anicteric no pallor. ENMT: no discharge from the ears eyes nose or mouth. Neck: no mass felt. JVD elevated. Respiratory: no rhonchi or crepitations. Cardiovascular: S1-S2 heard no murmurs appreciated. Abdomen: soft nontender bowel sounds present. Musculoskeletal: no edema. No joint effusion. Skin: no rash. Skin appears warm. Neurologic:alert awake oriented to place and person. Moves all extremities. Psychiatric: appears medically confused at times.   Labs on Admission: I have personally reviewed following labs and imaging studies  CBC:  Recent Labs Lab 05/02/17 0349 05/04/17 0535 05/08/17 2209 05/08/17 2228  WBC 10.5 12.8* 12.9*  --   NEUTROABS  --   --  10.0*  --   HGB 8.5* 8.0* 7.8* 8.8*  HCT 26.7* 25.6* 24.6* 26.0*  MCV 86.7 86.5 84.2  --   PLT 491* 509* 478*  --    Basic Metabolic Panel:  Recent Labs Lab 05/02/17 0349 05/04/17 0535 05/08/17 2209 05/08/17 2228  NA 131* 133* 126* 125*  K 4.1 3.9 3.9 3.9  CL 90* 94* 87* 86*  CO2 29 28 28   --   GLUCOSE 83 88 113* 114*  BUN 38* 19 32* 28*  CREATININE 1.28* 0.70 1.34* 1.30*  CALCIUM 9.8 9.8 9.4  --    GFR: Estimated Creatinine Clearance: 61.6 mL/min (A) (by C-G formula based on SCr of 1.3 mg/dL (H)). Liver Function Tests:  Recent Labs Lab 05/08/17 2209  AST 121*  ALT 126*  ALKPHOS 78  BILITOT 0.8  PROT 6.9  ALBUMIN 2.0*  No results for input(s): LIPASE, AMYLASE in the last 168 hours. No results for input(s): AMMONIA in the last 168 hours. Coagulation Profile:  Recent Labs Lab 05/08/17 2209  INR 1.22   Cardiac Enzymes: No results for input(s): CKTOTAL, CKMB, CKMBINDEX, TROPONINI in the last 168 hours. BNP (last 3  results) No results for input(s): PROBNP in the last 8760 hours. HbA1C: No results for input(s): HGBA1C in the last 72 hours. CBG: No results for input(s): GLUCAP in the last 168 hours. Lipid Profile: No results for input(s): CHOL, HDL, LDLCALC, TRIG, CHOLHDL, LDLDIRECT in the last 72 hours. Thyroid Function Tests: No results for input(s): TSH, T4TOTAL, FREET4, T3FREE, THYROIDAB in the last 72 hours. Anemia Panel: No results for input(s): VITAMINB12, FOLATE, FERRITIN, TIBC, IRON, RETICCTPCT in the last 72 hours. Urine analysis:    Component Value Date/Time   COLORURINE YELLOW 04/11/2017 [place] 04/11/2017 1716   LABSPEC 1.006 04/11/2017 1716   LABSPEC 1.010 04/10/2016 1224   PHURINE 5.0 04/11/2017 1716   GLUCOSEU NEGATIVE 04/11/2017 1716   GLUCOSEU Negative 04/10/2016 1224   HGBUR SMALL (A) 04/11/2017 1716   BILIRUBINUR NEGATIVE 04/11/2017 1716   BILIRUBINUR Negative 04/10/2016 1224   KETONESUR NEGATIVE 04/11/2017 1716   PROTEINUR NEGATIVE 04/11/2017 1716   UROBILINOGEN 0.2 04/10/2016 1224   NITRITE NEGATIVE 04/11/2017 1716   LEUKOCYTESUR NEGATIVE 04/11/2017 1716   LEUKOCYTESUR Negative 04/10/2016 1224   Sepsis Labs: @LABRCNTIP (procalcitonin:4,lacticidven:4) )No results found for this or any previous visit (from the past 240 hour(s)).   Radiological Exams on Admission: Dg Chest 2 View  Result Date: 05/08/2017 CLINICAL DATA:  Shortness of breath. EXAM: CHEST  2 VIEW COMPARISON:  Radiograph 05/01/2017, chest CT 04/12/2017 FINDINGS: Tip of the right chest port in the SVC. Lower lung volumes from prior exam. Multifocal pulmonary metastatic disease on the hilar adenopathy as seen on prior chest CT. Heart size and mediastinal contours are grossly stable per Right pleural effusion has increased from prior. No pneumothorax. IMPRESSION: 1. Increased right pleural effusion from prior exam. 2. Pulmonary metastatic disease and hilar adenopathy, grossly stable in  radiographic appearance from prior. Electronically Signed   By: Jeb Levering M.D.   On: 05/08/2017 22:08     Assessment/Plan Principal Problem:   Acute respiratory failure with hypoxia (HCC) Active Problems:   Normocytic normochromic anemia   Metastatic breast cancer ([place])   DCM (dilated cardiomyopathy) ([place])   A-fib ([place])    1. Acute respiratory failure with hypoxia - probably a combination of progressive metastasis to the lungs and also possible CHF. Patient is on Lasix and due to worsening creatinine will decrease the dose. Closely follow intake output and metabolic panel.will need oncology input. Last EF measured was 25-30% in June 2018. 2. Acute encephalopathy - patient appears at times confused. Will check ABG and MRI of the brain. Digoxin levels are therapeutic. 3. Acute on chronic kidney disease stage III - closely follow metabolic panel and if there is any further worsening of creatinine may have to hold Lasix and digoxin. 4. Metastatic brain cancer - per oncology. 5. Chronic atrial fibrillation - on Lovenox metoprolol and digoxin. 6. History of PE on Lovenox. 7. Anemia probably related to metastatic disease - follow CBC.  I have reviewed patient's old charts and labs.   DVT prophylaxis: Lovenox. Code Status: full code.  Family Communication: discussed with patient.  Disposition Plan: to be determined.  Consults called: none.  Admission status: inpatient.    Rise Patience MD [facility]  Pager [phone].  If 7PM-7AM, please contact night-coverage [email or web address] Password [email or web address]  05/09/2017, 12:32 AM

## 2017-05-09 NOTE — Progress Notes (Signed)
HEMATOLOGY-ONCOLOGY PROGRESS NOTE  SUBJECTIVE: Progressively worsening SOB with Metastatic breast cancer  OBJECTIVE: REVIEW OF SYSTEMS:   Constitutional: Frail and ill appering SOb, Weakness,     PHYSICAL EXAMINATION: ECOG PERFORMANCE STATUS: 4 - Bedbound  Vitals:   05/09/17 1026 05/09/17 1457  BP: 109/66 107/65  Pulse: 93 93  Resp:  (!) 22  Temp:  97.7 F (36.5 C)  SpO2:  96%   Filed Weights   05/08/17 2120 05/09/17 0137  Weight: 183 lb (83 kg) 194 lb 3.6 oz (88.1 kg)    GENERAL:alert, no distress and comfortable SKIN: skin color, texture, turgor are normal, no rashes or significant lesions EYES: normal, Conjunctiva are pink and non-injected, sclera clear OROPHARYNX:dry  NECK: supple, thyroid normal size, non-tender, without nodularity LYMPH:  no palpable lymphadenopathy in the cervical, axillary or inguinal LUNGS: Dim BS bil HEART: Tachy ABDOMEN:abdomen soft, non-tender and normal bowel sounds Musculoskeletal:no cyanosis of digits and no clubbing  NEURO: drowsy but answered questions appropriately  LABORATORY DATA:  I have reviewed the data as listed CMP Latest Ref Rng & Units 05/09/2017 05/08/2017 05/08/2017  Glucose 65 - 99 mg/dL 109(H) 114(H) 113(H)  BUN 6 - 20 mg/dL 32(H) 28(H) 32(H)  Creatinine 0.44 - 1.00 mg/dL 1.33(H) 1.30(H) 1.34(H)  Sodium 135 - 145 mmol/L 132(L) 125(L) 126(L)  Potassium 3.5 - 5.1 mmol/L 3.9 3.9 3.9  Chloride 101 - 111 mmol/L 90(L) 86(L) 87(L)  CO2 22 - 32 mmol/L 30 - 28  Calcium 8.9 - 10.3 mg/dL 9.6 - 9.4  Total Protein 6.5 - 8.1 g/dL - - 6.9  Total Bilirubin 0.3 - 1.2 mg/dL - - 0.8  Alkaline Phos 38 - 126 U/L - - 78  AST 15 - 41 U/L - - 121(H)  ALT 14 - 54 U/L - - 126(H)    Lab Results  Component Value Date   WBC 12.3 (H) 05/09/2017   HGB 7.7 (L) 05/09/2017   HCT 24.1 (L) 05/09/2017   MCV 82.8 05/09/2017   PLT 490 (H) 05/09/2017   NEUTROABS 10.0 (H) 05/08/2017    ASSESSMENT AND PLAN: 1. Metastatic Breast cancer Triple neg  disease; Ct scans showing rapid progression Failed all conventional chemotherapies as well as immunotherapy Discussed with her that there are no more treatments. She understood and agreed to meet with hospice Please consult hospice 2. Resp failure: Progression of Metastatic cancer and CHF and anemia

## 2017-05-09 NOTE — Progress Notes (Signed)
PHARMACIST - PHYSICIAN ORDER COMMUNICATION  CONCERNING: P&T Medication Policy on Herbal Medications  DESCRIPTION:  This patient's order for:  melatonin  has been noted.  This product(s) is classified as an "herbal" or natural product. Due to a lack of definitive safety studies or FDA approval, nonstandard manufacturing practices, plus the potential risk of unknown drug-drug interactions while on inpatient medications, the Pharmacy and Therapeutics Committee does not permit the use of "herbal" or natural products of this type within [facility]   ACTION TAKEN: The pharmacy department is unable to verify this order at this time and your patient has been informed of this safety policy. Please reevaluate patient's clinical condition at discharge and address if the herbal or natural product(s) should be resumed at that time.   Thanks Dorrene German 05/09/2017 12:56 AM

## 2017-05-09 NOTE — Progress Notes (Signed)
Please call report to [facility]. [phone]

## 2017-05-09 NOTE — Progress Notes (Signed)
ANTICOAGULATION CONSULT NOTE - Initial Consult  Pharmacy Consult for Lovenox Indication: pulmonary embolus and A-fib  Allergies  Allergen Reactions  . Dilaudid [Hydromorphone Hcl] Itching  . Ivp Dye [Iodinated Diagnostic Agents] Itching  . Percocet [Oxycodone-Acetaminophen] Itching  . Tape Itching and Rash    Hypofix causes rash  . Toradol [Ketorolac Tromethamine] Itching    Patient Measurements: Height: 5\' 9"  (175.3 cm) Weight: 183 lb (83 kg) IBW/kg (Calculated) : 66.2   Vital Signs: Temp: 97.8 F (36.6 C) (09/05 2109) Temp Source: Oral (09/05 2109) BP: 124/76 (09/06 0000) Pulse Rate: 90 (09/05 2314)  Labs:  Recent Labs  05/08/17 2209 05/08/17 2228  HGB 7.8* 8.8*  HCT 24.6* 26.0*  PLT 478*  --   LABPROT 15.3*  --   INR 1.22  --   CREATININE 1.34* 1.30*    Estimated Creatinine Clearance: 61.6 mL/min (A) (by C-G formula based on SCr of 1.3 mg/dL (H)).   Medical History: Past Medical History:  Diagnosis Date  . Arthritis   . Asthma   . Back disorder    degenerative disk disease  . Brain metastases ([place]) dx'd 2017  . Breast CA ([place]) dx'd 2016   left  . Breast cancer ([place]) dx'd 2017   right   . Cardiomyopathy ([place])    a. Dx 02/2017 - EF 25-30%, felt possibly due to adriamycin +/- tachy mediated from atrial fib/flutter.  Marland Kitchen GERD (gastroesophageal reflux disease)   . Mammogram abnormal 08/24/15   first  . Metastatic cancer to lung ([place]) dx'd 11/2016  . Migraine   . Normal coronary arteries    01/2016 cath  . PAF (paroxysmal atrial fibrillation) ([place])   . Pap smear for cervical cancer screening 08/12/15  . Paroxysmal atrial flutter (HCC)     Medications:  Scheduled:  . amiodarone  200 mg Oral Daily  . cholecalciferol  1,000 Units Oral Daily  . digoxin  0.125 mg Oral Daily  . fentaNYL  50 mcg Transdermal Q72H  . ferrous sulfate  325 mg Oral Q breakfast  . metoprolol succinate  12.5 mg Oral Daily  . polyethylene glycol  17 g Oral BID  . potassium  chloride SA  20 mEq Oral BID   Infusions:  . furosemide      Assessment: 73 yoF c/o SOB, CP and leg swelling on PTA Lovenox for hx of PE and A-fib.  Lovenox per Rx. Had 2 doses 9/5. Goal of Therapy:  Anti-Xa level 0.6-1 units/ml 4hrs after LMWH dose given    Plan:  Lovenox 85 mg sq q12h F/u scr  Lawana Pai R 05/09/2017,1:06 AM

## 2017-05-09 NOTE — Progress Notes (Signed)
[place] for Enoxaparin Indication: history of pulmonary embolus and a-fib  Allergies  Allergen Reactions  . Dilaudid [Hydromorphone Hcl] Itching  . Ivp Dye [Iodinated Diagnostic Agents] Itching  . Percocet [Oxycodone-Acetaminophen] Itching  . Tape Itching and Rash    Hypofix causes rash  . Toradol [Ketorolac Tromethamine] Itching    Patient Measurements: Height: 5\' 9"  (175.3 cm) Weight: 194 lb 3.6 oz (88.1 kg) IBW/kg (Calculated) : 66.2   Vital Signs: Temp: 98.1 F (36.7 C) (09/06 0137) Temp Source: Oral (09/06 0137) BP: 109/66 (09/06 1026) Pulse Rate: 93 (09/06 1026)  Labs:  Recent Labs  05/08/17 2209 05/08/17 2228 05/09/17 0523  HGB 7.8* 8.8* 7.7*  HCT 24.6* 26.0* 24.1*  PLT 478*  --  490*  LABPROT 15.3*  --   --   INR 1.22  --   --   CREATININE 1.34* 1.30* 1.33*    Estimated Creatinine Clearance: 61.9 mL/min (A) (by C-G formula based on SCr of 1.33 mg/dL (H)).   Medical History: Past Medical History:  Diagnosis Date  . Arthritis   . Asthma   . Back disorder    degenerative disk disease  . Brain metastases ([place]) dx'd 2017  . Breast CA ([place]) dx'd 2016   left  . Breast cancer ([place]) dx'd 2017   right   . Cardiomyopathy ([place])    a. Dx 02/2017 - EF 25-30%, felt possibly due to adriamycin +/- tachy mediated from atrial fib/flutter.  Marland Kitchen GERD (gastroesophageal reflux disease)   . Mammogram abnormal 08/24/15   first  . Metastatic cancer to lung ([place]) dx'd 11/2016  . Migraine   . Normal coronary arteries    01/2016 cath  . PAF (paroxysmal atrial fibrillation) ([place])   . Pap smear for cervical cancer screening 08/12/15  . Paroxysmal atrial flutter (HCC)     Assessment: 66 yoF with PMH of metastatic breast cancer admitted on 05/09/2017 with c/o SOB, CP and leg swelling. Patient on Enoxaparin 100mg  SQ q12h PTA for hx of PE and a-fib. Pharmacy consulted to resume Enoxaparin while patient admitted.   Today, 05/09/17:  SCr  elevated at 1.33, CrCl ~ 62 ml/min  CBC: Hgb decreased to 7.7, Pltc elevated  Transfusing 1 unit PRBCs today  No bleeding issues reported  MRI of brain without contrast: no acute infarct or acute hemorrhage  Goal of Therapy:  Anti-Xa level 0.6-1 units/ml 4hrs after LMWH dose given Monitor platelets by anticoagulation protocol: yes   Plan:  Adjust Lovenox dose to 90mg  SQ q12h for updated weight Follow-up SCr CBC in AM Monitor closely for s/sx of bleeding   Lindell Spar, PharmD, BCPS Pager: [phone] 05/09/2017 11:30 AM

## 2017-05-10 DIAGNOSIS — C50919 Malignant neoplasm of unspecified site of unspecified female breast: Secondary | ICD-10-CM

## 2017-05-10 LAB — CBC
HEMATOCRIT: 25.7 % — AB (ref 36.0–46.0)
HEMOGLOBIN: 8.2 g/dL — AB (ref 12.0–15.0)
MCH: 27.5 pg (ref 26.0–34.0)
MCHC: 31.9 g/dL (ref 30.0–36.0)
MCV: 86.2 fL (ref 78.0–100.0)
Platelets: 468 10*3/uL — ABNORMAL HIGH (ref 150–400)
RBC: 2.98 MIL/uL — ABNORMAL LOW (ref 3.87–5.11)
RDW: 18.1 % — AB (ref 11.5–15.5)
WBC: 10.6 10*3/uL — ABNORMAL HIGH (ref 4.0–10.5)

## 2017-05-10 LAB — COMPREHENSIVE METABOLIC PANEL
ALBUMIN: 1.9 g/dL — AB (ref 3.5–5.0)
ALK PHOS: 74 U/L (ref 38–126)
ALT: 108 U/L — ABNORMAL HIGH (ref 14–54)
ANION GAP: 12 (ref 5–15)
AST: 111 U/L — ABNORMAL HIGH (ref 15–41)
BILIRUBIN TOTAL: 0.8 mg/dL (ref 0.3–1.2)
BUN: 31 mg/dL — ABNORMAL HIGH (ref 6–20)
CALCIUM: 9.2 mg/dL (ref 8.9–10.3)
CO2: 30 mmol/L (ref 22–32)
Chloride: 88 mmol/L — ABNORMAL LOW (ref 101–111)
Creatinine, Ser: 1.21 mg/dL — ABNORMAL HIGH (ref 0.44–1.00)
GFR, EST NON AFRICAN AMERICAN: 52 mL/min — AB (ref >60)
GLUCOSE: 99 mg/dL (ref 65–99)
POTASSIUM: 3.9 mmol/L (ref 3.5–5.1)
Sodium: 130 mmol/L — ABNORMAL LOW (ref 135–145)
TOTAL PROTEIN: 6.5 g/dL (ref 6.5–8.1)

## 2017-05-10 MED ORDER — MORPHINE SULFATE (PF) 2 MG/ML IV SOLN
8.0000 mg | INTRAVENOUS | Status: DC | PRN
  Administered 2017-05-11 – 2017-05-12 (×9): 8 mg via INTRAVENOUS
  Filled 2017-05-10 (×9): qty 4

## 2017-05-10 MED ORDER — MORPHINE SULFATE (PF) 10 MG/ML IV SOLN
8.0000 mg | INTRAVENOUS | Status: DC | PRN
  Administered 2017-05-10 (×3): 8 mg via INTRAVENOUS
  Filled 2017-05-10 (×3): qty 1

## 2017-05-10 NOTE — Progress Notes (Addendum)
PROGRESS NOTE    Cindy Bradshaw Dibbles  ZOX:[id number] DOB: 1971-07-12 DOA: 05/08/2017 PCP: [name], MD  Brief Narrative:Cindy Bradshaw is a 46 y.o. female with history of metastatic breast cancer presents to the ER with worsening shortness of breath over the last few days. Just Dced 9/1, 5th admission in 50months.  Assessment & Plan:   Acute hypoxic respiratory failure -Secondary to progression of pulmonary metastasis, CT scan done overnight on admission shows rapid progression of pulmonary metastasis and adenopathy -I will also continue IV Lasix at this time since patient is intermittently poorly compliant with this at home due to significant symptom burden from her dyspnea  -s/p 1 unit PRBC, continue IV lasix-although yield is less -continues to remain unrealistic about advanced cancer and treatment options  Widely metastatic breast cancer -Extensive pulmonary metastasis, bone metastases, liver mets, subcut nodules -MRI done this morning is concerning for cerebellar cystic lesion possibly metastatic and osseous metastasis as well -Patient's prognosis appears to be very poor and functional status is also very poor -This is her fifth admission in 3 months, she has been seen by palliative care during the past 2 hospitalizations and continues to insist on being a full code and requests full scope of medical treatment -She was given a dose of immunotherapy-Pembrolizumab, and a couple of weeks back at [facility] -Ct 9/6 shows rapid progression of mediastinal adenopathy, pulmonary metastasis, liver metastasis, and right breast masses -Seen by [name] 9/6, he recommended Hospice and didn't feel that he had anything further to offer her -patient continues to remain unrealistic and wouldn't agree to DNR when discussed again today -Palliative re consulted in this challenging case  Anemia -Acute and chronic -Secondary to chronic disease, rapidly progressing malignancy -s/p 1  unit PRBC  Acute on chronic systolic CHF/nonischemic cardiomyopathy -Ejection fraction 25% -Related to chemotherapy (Adriamycin) -continue IV lasix today  History of pulmonary embolism -Continue full dose Lovenox  Paroxysmal atrial fibrillation -Currently in sinus rhythm. Previous episodes of intermittent rapid ventricular response suspect significant pulmonary disease burden from her metastasis contributing to A. fib RVR -Continue current regimen of amiodarone and metoprolol  Chronic pain -Secondary to metastatic cancer -Continue fentanyl patch and oxycodone per home regimen  Code status Full Code Communication: no family at bedside Disposition Plan: to be determined  Consultants:   Oncology Dr. Sonny Dandy   Subjective: Continues to be short of breath  subjective: Vitals:   05/09/17 2353 05/10/17 0025 05/10/17 0237 05/10/17 0445  BP: 108/69 127/70 120/69 116/71  Pulse: 99 99 (!) 101 (!) 102  Resp: 18 18 (!) 24 (!) 22  Temp: 98.4 F (36.9 C) 98.3 F (36.8 C) 97.8 F (36.6 C) 98.7 F (37.1 C)  TempSrc: Oral Oral Oral Oral  SpO2: 98% 99% 95% 99%  Weight:    87.2 kg (192 lb 3.9 oz)  Height:        Intake/Output Summary (Last 24 hours) at 05/10/17 1511 Last data filed at 05/10/17 1445  Gross per 24 hour  Intake              926 ml  Output             1250 ml  Net             -324 ml   Filed Weights   05/08/17 2120 05/09/17 0137 05/10/17 0445  Weight: 83 kg (183 lb) 88.1 kg (194 lb 3.6 oz) 87.2 kg (192 lb 3.9 oz)    Examination:  Gen:  chronically ill female, dyspneic, uncomfortable HEENT: PERRLA, Neck supple, no JVD Lungs: few scattered ronchi CVS: RRR,No Gallops,Rubs or new Murmurs Abd: soft, Non tender, non distended, BS present Extremities: No Cyanosis, Clubbing or edema Skin: no new rashes Psychiatry: Flat affect    Data Reviewed:   CBC:  Recent Labs Lab 05/04/17 0535 05/08/17 2209 05/08/17 2228 05/09/17 0523 05/10/17 0531  WBC 12.8* 12.9*   --  12.3* 10.6*  NEUTROABS  --  10.0*  --   --   --   HGB 8.0* 7.8* 8.8* 7.7* 8.2*  HCT 25.6* 24.6* 26.0* 24.1* 25.7*  MCV 86.5 84.2  --  82.8 86.2  PLT 509* 478*  --  490* 782*   Basic Metabolic Panel:  Recent Labs Lab 05/04/17 0535 05/08/17 2209 05/08/17 2228 05/09/17 0523 05/10/17 0531  NA 133* 126* 125* 132* 130*  K 3.9 3.9 3.9 3.9 3.9  CL 94* 87* 86* 90* 88*  CO2 28 28  --  30 30  GLUCOSE 88 113* 114* 109* 99  BUN 19 32* 28* 32* 31*  CREATININE 0.70 1.34* 1.30* 1.33* 1.21*  CALCIUM 9.8 9.4  --  9.6 9.2   GFR: Estimated Creatinine Clearance: 67.7 mL/min (A) (by C-G formula based on SCr of 1.21 mg/dL (H)). Liver Function Tests:  Recent Labs Lab 05/08/17 2209 05/10/17 0531  AST 121* 111*  ALT 126* 108*  ALKPHOS 78 74  BILITOT 0.8 0.8  PROT 6.9 6.5  ALBUMIN 2.0* 1.9*   No results for input(s): LIPASE, AMYLASE in the last 168 hours. No results for input(s): AMMONIA in the last 168 hours. Coagulation Profile:  Recent Labs Lab 05/08/17 2209  INR 1.22   Cardiac Enzymes: No results for input(s): CKTOTAL, CKMB, CKMBINDEX, TROPONINI in the last 168 hours. BNP (last 3 results) No results for input(s): PROBNP in the last 8760 hours. HbA1C: No results for input(s): HGBA1C in the last 72 hours. CBG: No results for input(s): GLUCAP in the last 168 hours. Lipid Profile: No results for input(s): CHOL, HDL, LDLCALC, TRIG, CHOLHDL, LDLDIRECT in the last 72 hours. Thyroid Function Tests: No results for input(s): TSH, T4TOTAL, FREET4, T3FREE, THYROIDAB in the last 72 hours. Anemia Panel: No results for input(s): VITAMINB12, FOLATE, FERRITIN, TIBC, IRON, RETICCTPCT in the last 72 hours. Urine analysis:    Component Value Date/Time   COLORURINE YELLOW 05/09/2017 [place] 05/09/2017 0942   LABSPEC 1.010 05/09/2017 0942   LABSPEC 1.010 04/10/2016 1224   PHURINE 5.0 05/09/2017 0942   GLUCOSEU NEGATIVE 05/09/2017 0942   GLUCOSEU Negative 04/10/2016  1224   HGBUR NEGATIVE 05/09/2017 0942   BILIRUBINUR NEGATIVE 05/09/2017 0942   BILIRUBINUR Negative 04/10/2016 1224   KETONESUR NEGATIVE 05/09/2017 0942   PROTEINUR NEGATIVE 05/09/2017 0942   UROBILINOGEN 0.2 04/10/2016 1224   NITRITE NEGATIVE 05/09/2017 0942   LEUKOCYTESUR NEGATIVE 05/09/2017 0942   LEUKOCYTESUR Negative 04/10/2016 1224   Sepsis Labs: @LABRCNTIP (procalcitonin:4,lacticidven:4)  )No results found for this or any previous visit (from the past 240 hour(s)).       Radiology Studies: Dg Chest 2 View  Result Date: 05/08/2017 CLINICAL DATA:  Shortness of breath. EXAM: CHEST  2 VIEW COMPARISON:  Radiograph 05/01/2017, chest CT 04/12/2017 FINDINGS: Tip of the right chest port in the SVC. Lower lung volumes from prior exam. Multifocal pulmonary metastatic disease on the hilar adenopathy as seen on prior chest CT. Heart size and mediastinal contours are grossly stable per Right pleural effusion has increased from prior. No pneumothorax. IMPRESSION:  1. Increased right pleural effusion from prior exam. 2. Pulmonary metastatic disease and hilar adenopathy, grossly stable in radiographic appearance from prior. Electronically Signed   By: Jeb Levering M.D.   On: 05/08/2017 22:08   Ct Chest Wo Contrast  Result Date: 05/09/2017 CLINICAL DATA:  46 y/o F; history of breast cancer with lung and brain metastasis. Chemotherapy and x-ray therapy completed with ongoing immunotherapy. Shortness of breath with chest pain under the left breast. EXAM: CT CHEST WITHOUT CONTRAST TECHNIQUE: Multidetector CT imaging of the chest was performed following the standard protocol without IV contrast. COMPARISON:  04/12/2017 CT of the chest FINDINGS: Cardiovascular: Right port catheter with tip in the right atrium. Normal heart size. No pericardial effusion. Normal caliber thoracic aorta. Mediastinum/Nodes: Diffuse extensive mediastinal adenopathy and right paratracheal, left paratracheal, prevascular, AP  window, carinal, and bilateral hilar stations is increased for example a right lower paratracheal node measures 25 x 31 mm, previously 19 x 27 mm and a right cardiophrenic node measuring 47 mm, previously 33 mm. Normal thyroid gland. Normal thoracic esophagus. Lungs/Pleura: Numerous pulmonary nodules are increased in size for example a nodule in the left lung apex measuring 18 mm, previously 15 mm. Pulmonary metastasis are greatest in the right infrahilar region with air confluent with the hilum, narrowing airways, and extending into the lung base. The Small right pleural effusion is decreased. Upper Abdomen: Marked progression of diffuse hepatic metastasis with several new and enlarging when compared prior CT of chest. Musculoskeletal: Multiple masses in the right breast are increased in size, for example a right lateral breast mass measuring 27 mm, previously 18 mm. IMPRESSION: Rapid progression of mediastinal adenopathy, pulmonary metastasis, liver metastasis, and right breast masses. Electronically Signed   By: Kristine Garbe M.D.   On: 05/09/2017 00:54   Mr Brain Wo Contrast  Result Date: 05/09/2017 CLINICAL DATA:  History of metastatic breast cancer. Patient presents with worsening shortness of breath and acute encephalopathy. EXAM: MRI HEAD WITHOUT CONTRAST TECHNIQUE: Axial DWI, coronal DWI, sagittal T1, axial T2, axial T2 FLAIR sequences were acquired. COMPARISON:  04/12/2017 CT head FINDINGS: Brain: Cystic lesion in right cerebellar hemisphere measuring 11 mm with surrounding T2 FLAIR hyperintense signal abnormality (series 6, image 9 and series 7, image 9). There several additional T2 hyperintense, T1 hypointense, FLAIR hypointense foci scattered throughout the supratentorial brain with surrounding T2 FLAIR signal. There is no reduced diffusion to suggest acute or early subacute infarction. No significant mass effect, effacement of basilar cisterns, or extra-axial collection. Vascular: Normal  flow voids. Skull and upper cervical spine: Right posterior temporal bone periosteal lesion the outer cortex measuring 16 x 7 mm (series 6, image 17). Sinuses/Orbits: Negative. Other: None. IMPRESSION: 1. No acute infarct, acute hemorrhage, or significant mass effect. 2. Cystic lesion in right cerebellar hemisphere measuring 11 mm with surrounding edema, metastasis is suspected. 3. Several additional subcentimeter nonspecific lesions are present in the supratentorial brain which may represent the additional foci of metastasis. Differential includes sequelae of ischemia, demyelination, or prior infectious/inflammatory process. 4. Periosteal lesion in outer cortex of right temporal bone, additional possible metastasis. 5. MRI of the brain or CT of the head with and without contrast is recommended when patient is able. Electronically Signed   By: Kristine Garbe M.D.   On: 05/09/2017 07:03        Scheduled Meds: . amiodarone  200 mg Oral Daily  . cholecalciferol  1,000 Units Oral Daily  . digoxin  0.125 mg Oral Daily  .  enoxaparin (LOVENOX) injection  1 mg/kg Subcutaneous Q12H  . [START ON 05/11/2017] fentaNYL  50 mcg Transdermal Q72H  . ferrous sulfate  325 mg Oral Q breakfast  . furosemide  80 mg Intravenous Q12H  . mouth rinse  15 mL Mouth Rinse BID  . metoprolol succinate  12.5 mg Oral Daily  . multivitamin  15 mL Oral Daily  . polyethylene glycol  17 g Oral BID  . potassium chloride SA  20 mEq Oral BID  . protein supplement shake  11 oz Oral Q24H  . sodium chloride flush  10-40 mL Intracatheter Q12H   Continuous Infusions: . sodium chloride       LOS: 1 day    Time spent: 64min    Domenic Polite, MD [facility] Pager [phone]  If 7PM-7AM, please contact night-coverage [email or web address] Password [email or web address] 05/10/2017, 3:11 PM

## 2017-05-11 DIAGNOSIS — Z7189 Other specified counseling: Secondary | ICD-10-CM

## 2017-05-11 DIAGNOSIS — I420 Dilated cardiomyopathy: Secondary | ICD-10-CM

## 2017-05-11 DIAGNOSIS — R52 Pain, unspecified: Secondary | ICD-10-CM

## 2017-05-11 LAB — TYPE AND SCREEN
ABO/RH(D): O POS
Antibody Screen: NEGATIVE
UNIT DIVISION: 0

## 2017-05-11 LAB — BPAM RBC
Blood Product Expiration Date: 201810102359
ISSUE DATE / TIME: 201809070000
UNIT TYPE AND RH: 5100

## 2017-05-11 MED ORDER — FUROSEMIDE 10 MG/ML IJ SOLN
40.0000 mg | Freq: Two times a day (BID) | INTRAMUSCULAR | Status: DC
  Administered 2017-05-11 – 2017-05-12 (×2): 40 mg via INTRAVENOUS
  Filled 2017-05-11 (×2): qty 4

## 2017-05-11 NOTE — Consult Note (Signed)
Consultation Note Date: 05/11/2017   Patient Name: Cindy Bradshaw. Huettner  DOB: February 09, 1971  MRN: [id number]  Age / Sex: 46 y.o., female  PCP: [name], MD Referring Physician: Domenic Polite, MD  Reason for Consultation: Establishing goals of care  HPI/Patient Profile: 46 y.o. female  admitted on 05/08/2017   Clinical Assessment and Goals of Care:  Cindy Bradshaw is a 46 year old lady with metastatic breast cancer. She has pulmonary, bone, liver metastases take disease. She came in with shortness of breath and is found to have progression of her pulmonary metastatic disease. She received immunotherapy recently. She has been followed by Dr. Lindi Adie recently. She received immunotherapy recently. However, she has ongoing extensive metastatic burden. MRI shows cerebellar cystic lesion, possibly metastatic in OCS metastases as well. Additionally, CT scan shows rapid progression of mediastinal adenopathy and right breast masses.  Palliative consultation for goals of care discussions.  Cindy Bradshaw is resting in bed. She has recently received IV morphine. She is on transdermal fentanyl 50 g patch. She is on 8 mg of IV morphine. She has required 5 such dosages in the past 24 hours. Discussed with the patient and her daughter. Daughter states patient is allergic to Dilaudid. We discussed about goals wishes and values and appropriate management for Cindy Bradshaw going forward. Patient's daughter states she is overwhelmed with her decision-making process for her mother. She wants the patient's sister to weigh in on decisions as well as participate in the patient's care. Reportedly, patient's sister is supposed to arrive from [place] next week. We discussed about end-of-life signs and symptoms. We discussed about the patient's extensive symptom burden from extensive metastatic disease. Pilar Plate and honest discussions were held about the  serious, incurable, irreversible nature of the patient's illness. Discussed about focusing exclusively on making sure Cindy Bradshaw is as comfortable as possible. CODE STATUS discussions also undertaken in great detail. See recommendations below. Thank you for the consult. We will continue to follow along.  NEXT OF KIN  daughter Cindy Bradshaw    1. Discussed in detail with patient and daughter about DNR DNI, they wish to discuss further with other family members, I will follow up on 05-12-17.  2. Goals of care discussions: we reviewed the patient's serious life limiting condition, her rapidly progressive malignancy, we did bring up hospice philosophy of care and had discussions about how hospice can help the patient stay comfortable at this stage.   We will continue to follow along and help guide appropriate decision making. Thank you for the consult.   Code Status/Advance Care Planning:  Full code    Symptom Management:    trans dermal fentanyl patch 50 mcg Q 72 hours.   Morphine IV PRN.   Palliative Prophylaxis:   Bowel Regimen     Psycho-social/Spiritual:   Desire for further Chaplaincy support:yes  Additional Recommendations: Education on Hospice  Prognosis:   Guarded, could be few weeks   Discharge Planning: ongoing discussions with patient and family about hospice.  Primary Diagnoses: Present on Admission: . Acute respiratory failure with hypoxia ([place]) . Normocytic normochromic anemia . Metastatic breast cancer ([place]) . DCM (dilated cardiomyopathy) ([place]) . A-fib [facility])   I have reviewed the medical record, interviewed the patient and family, and examined the patient. The following aspects are pertinent.  Past Medical History:  Diagnosis Date  . Arthritis   . Asthma   . Back disorder    degenerative disk disease  . Brain metastases ([place]) dx'd 2017  . Breast CA ([place]) dx'd 2016   left  . Breast cancer ([place]) dx'd  2017   right   . Cardiomyopathy ([place])    a. Dx 02/2017 - EF 25-30%, felt possibly due to adriamycin +/- tachy mediated from atrial fib/flutter.  Marland Kitchen GERD (gastroesophageal reflux disease)   . Mammogram abnormal 08/24/15   first  . Metastatic cancer to lung ([place]) dx'd 11/2016  . Migraine   . Normal coronary arteries    01/2016 cath  . PAF (paroxysmal atrial fibrillation) ([place])   . Pap smear for cervical cancer screening 08/12/15  . Paroxysmal atrial flutter [facility])    Social History   Social History  . Marital status: Legally Separated    Spouse name: Chrissie Noa  . Number of children: 2  . Years of education: [name]   Occupational History  . Web designer    Social History Main Topics  . Smoking status: Never Smoker  . Smokeless tobacco: Never Used  . Alcohol use No  . Drug use: No  . Sexual activity: Not Asked   Other Topics Concern  . None   Social History Narrative   ** Merged History Encounter **       First MP age 64.  LMP 08/26/15.     2 children carried to term.  Age at first live birth 43   H/o oral contrasception use      Family History  Problem Relation Age of Onset  . Lung cancer Mother 60       2 different types of lung cancer; metastasis to brain; smoker  . Other Mother        hx of hysterectomy for unspecified reason  . Bone cancer Maternal Uncle        dx. early 29s  . Breast cancer Maternal Grandmother        dx. early 34s, s/p mastectomy  . Cancer Paternal Grandfather        unspecified type of cancer, dx. late 107s  . Congestive Heart Failure Father        smoker  . Stroke Father   . Eczema Father   . Cirrhosis Maternal Grandfather   . Heart Problems Maternal Grandfather   . Asthma Daughter   . Allergies Daughter        hives  . Lung cancer Other        (maternal great uncle; MGM's brother); had a coal stove  . Cancer Cousin        unspecified type; d. early age (paternal first cousin once-removed)  . Cancer Cousin        dx. as a kid;  in remission today; (paternal 2nd cousin)  . Cancer Cousin        unspecified type; d. early 74s; (maternal 1st cousin)   Scheduled Meds: . amiodarone  200 mg Oral Daily  . cholecalciferol  1,000 Units Oral Daily  . digoxin  0.125 mg Oral Daily  . enoxaparin (LOVENOX) injection  1 mg/kg Subcutaneous Q12H  .  fentaNYL  50 mcg Transdermal Q72H  . ferrous sulfate  325 mg Oral Q breakfast  . furosemide  40 mg Intravenous Q12H  . mouth rinse  15 mL Mouth Rinse BID  . metoprolol succinate  12.5 mg Oral Daily  . multivitamin  15 mL Oral Daily  . polyethylene glycol  17 g Oral BID  . potassium chloride SA  20 mEq Oral BID  . protein supplement shake  11 oz Oral Q24H  . sodium chloride flush  10-40 mL Intracatheter Q12H   Continuous Infusions: . sodium chloride     PRN Meds:.acetaminophen **OR** acetaminophen, albuterol, albuterol, ALPRAZolam, guaiFENesin-dextromethorphan, lactulose, methocarbamol, morphine injection, ondansetron **OR** ondansetron (ZOFRAN) IV, senna, sodium chloride flush Medications Prior to Admission:  Prior to Admission medications   Medication Sig Start Date End Date Taking? Authorizing Provider  albuterol (PROVENTIL HFA;VENTOLIN HFA) 108 (90 Base) MCG/ACT inhaler Inhale 1-2 puffs into the lungs every 6 (six) hours as needed for wheezing or shortness of breath. 07/24/16  Yes Waynetta Pean, PA-C  albuterol (PROVENTIL) (2.5 MG/3ML) 0.083% nebulizer solution Take 2.5 mg by nebulization every 6 (six) hours as needed for wheezing or shortness of breath.   Yes [provider]  amiodarone (PACERONE) 200 MG tablet Take 1 tablet (200 mg total) by mouth daily. 04/27/17  Yes Patrecia Pour, Christean Grief, MD  cholecalciferol (VITAMIN D) 1000 units tablet Take 1,000 Units by mouth daily.   Yes [provider]  digoxin (LANOXIN) 0.125 MG tablet Take 1 tablet (0.125 mg total) by mouth daily. 05/04/17  Yes Robbie Lis, MD  enoxaparin (LOVENOX) 100 MG/ML injection Inject 1 mL  (100 mg total) into the skin every 12 (twelve) hours. 02/26/17  Yes Hosie Poisson, MD  fentaNYL (DURAGESIC - DOSED MCG/HR) 50 MCG/HR Place 1 patch (50 mcg total) onto the skin every 3 (three) days. 04/27/17  Yes Patrecia Pour, Christean Grief, MD  ferrous sulfate 325 (65 FE) MG EC tablet Take 325 mg by mouth daily with breakfast.    Yes [provider]  furosemide (LASIX) 40 MG tablet Take 3 tablets (120 mg total) by mouth 2 (two) times daily. 04/27/17  Yes Patrecia Pour, Christean Grief, MD  guaiFENesin-dextromethorphan [facility] DM) 100-10 MG/5ML syrup Take 5 mLs by mouth every 4 (four) hours as needed for cough. 02/26/17  Yes Hosie Poisson, MD  lactulose (CHRONULAC) 10 GM/15ML solution Take 30 mLs (20 g total) by mouth 2 (two) times daily as needed for mild constipation or moderate constipation. 04/27/17  Yes Patrecia Pour, Christean Grief, MD  Melatonin 10 MG TABS Take 10 mg by mouth at bedtime.    Yes [provider]  methocarbamol (ROBAXIN) 500 MG tablet Take 1 tablet (500 mg total) by mouth every 6 (six) hours as needed for muscle spasms. 04/27/17  Yes Patrecia Pour, Christean Grief, MD  metoprolol succinate (TOPROL-XL) 25 MG 24 hr tablet Take 0.5 tablets (12.5 mg total) by mouth daily. 04/28/17  Yes Patrecia Pour, Christean Grief, MD  morphine (MSIR) 30 MG tablet Take 1 tablet (30 mg total) by mouth every 4 (four) hours as needed for moderate pain or severe pain. 04/27/17  Yes Patrecia Pour, Christean Grief, MD  ondansetron (ZOFRAN) 8 MG tablet Take 1 tablet (8 mg total) by mouth every 8 (eight) hours as needed (Nausea or vomiting). 04/27/17  Yes Patrecia Pour, Christean Grief, MD  polyethylene glycol [facility] Floria Raveling) packet Take 17 g by mouth 2 (two) times daily. 04/27/17  Yes Patrecia Pour, Christean Grief, MD  potassium chloride SA (K-DUR,KLOR-CON) 20 MEQ tablet  Take 2 tablets (40 mEq total) by mouth daily. Patient taking differently: Take 20 mEq by mouth 2 (two) times daily.  04/27/17  Yes Patrecia Pour, Christean Grief, MD  senna (SENOKOT) 8.6 MG TABS tablet Take 1 tablet  by mouth daily as needed for mild constipation.   Yes [provider]  predniSONE (DELTASONE) 5 MG tablet Take 2 tablet daily for 5 days, then take 1 tablet daily for 5 days, then take half a tablet daily for 7 days Patient not taking: Reported on 05/08/2017 04/27/17   Doreatha Lew, MD   Allergies  Allergen Reactions  . Dilaudid [Hydromorphone Hcl] Itching  . Ivp Dye [Iodinated Diagnostic Agents] Itching  . Percocet [Oxycodone-Acetaminophen] Itching  . Tape Itching and Rash    Hypofix causes rash  . Toradol [Ketorolac Tromethamine] Itching   Review of Systems +generalized pain Palliative performance scale 30%. Physical Exam Gen: chronically ill female, dyspneic, uncomfortable HEENT: PERRLA, Neck supple, no JVD Lungs:few ronchi diffusely CVS: RRR,No Gallops,Rubs or new Murmurs Abd: soft, Non tender, non distended, BS present Extremities: No Cyanosis, Clubbing or edema Skin: no new rashes Vital Signs: BP 118/65 (BP Location: Right Arm)   Pulse 97   Temp 97.9 F (36.6 C) (Oral)   Resp 18   Ht 5\' 9"  (1.753 m)   Wt 87.2 kg (192 lb 3.9 oz)   SpO2 99%   BMI 28.39 kg/m  Pain Assessment: 0-10 POSS *See Group Information*: S-Acceptable,Sleep, easy to arouse Pain Score: Asleep   SpO2: SpO2: 99 % O2 Device:SpO2: 99 % O2 Flow Rate: .O2 Flow Rate (L/min): 3 L/min  IO: Intake/output summary:  Intake/Output Summary (Last 24 hours) at 05/11/17 1341 Last data filed at 05/10/17 1900  Gross per 24 hour  Intake               60 ml  Output              500 ml  Net             -440 ml    LBM: Last BM Date: 05/10/17 Baseline Weight: Weight: 83 kg (183 lb) Most recent weight: Weight: 87.2 kg (192 lb 3.9 oz)     Palliative Assessment/Data:   Flowsheet Rows     Most Recent Value  Intake Tab  Referral Department  Hospitalist  Unit at Time of Referral  Oncology Unit  Palliative Care Primary Diagnosis  Cancer  Date Notified  05/10/17  Palliative Care Type  New  Palliative care  Reason for referral  Pain, Clarify Goals of Care, Counsel Regarding Hospice  Date of Admission  05/08/17  Date first seen by Palliative Care  05/11/17  # of days IP prior to Palliative referral  2  Clinical Assessment  Palliative Performance Scale Score  30%  Pain Max last 24 hours  5  Pain Min Last 24 hours  4  Dyspnea Max Last 24 Hours  4  Dyspnea Min Last 24 hours  3  Nausea Max Last 24 Hours  2  Nausea Min Last 24 Hours  1  Anxiety Max Last 24 Hours  4  Anxiety Min Last 24 Hours  3  Psychosocial & Spiritual Assessment  Palliative Care Outcomes  Patient/Family meeting held?  Yes  Who was at the meeting?  patient daughter   Palliative Care Outcomes  Clarified goals of care      Time In:  72 Time Out:  1310 Time Total:  70 Greater than 50%  of this  time was spent counseling and coordinating care related to the above assessment and plan.  Signed by: Loistine Chance, MD  [phone] Please contact Palliative Medicine Team phone at [phone] for questions and concerns.  For individual provider: See Shea Evans

## 2017-05-11 NOTE — Progress Notes (Signed)
PROGRESS NOTE    Cindy Bradshaw  BJY:[id number] DOB: 08-Nov-1970 DOA: 05/08/2017 PCP: [name], MD  Brief Narrative:Cindy Bradshaw is a 46 y.o. female with history of metastatic breast cancer presents to the ER with worsening shortness of breath over the last few days. Just Dced 9/1, 5th admission in 51months.  Assessment & Plan:   Acute hypoxic respiratory failure -Secondary to progression of pulmonary metastasis, CT scan- on admission shows rapid progression of pulmonary metastasis and adenopathy -s/p I unit PRBC and lasix -continue IV lasix today, cut down dose due to soft BPs  Widely metastatic breast cancer -Extensive pulmonary metastasis, bone metastases, liver mets, subcut nodules -MRI done this morning is concerning for cerebellar cystic lesion possibly metastatic and osseous metastasis as well -Patient's prognosis appears to be very poor and functional status is also very poor -This is her fifth admission in 3 months, she has been seen by palliative care during the past 2 hospitalizations -She was given a dose of immunotherapy-Pembrolizumab, and a couple of weeks back at [place] 9/6 shows rapid progression of mediastinal adenopathy, pulmonary metastasis, liver metastasis, and right breast masses -Seen by [name] 9/6, he recommended Hospice and didn't feel that he had anything further to offer her -patient continues to remain unrealistic  -Palliative re consulted, goals of care meeting today  Anemia -Acute and chronic -Secondary to chronic disease, rapidly progressing malignancy -s/p 1 unit PRBC, Hb improved  Acute on chronic systolic CHF/nonischemic cardiomyopathy -Ejection fraction 25% -Related to chemotherapy (Doxorubicin/adrimicin) -continue IV lasix today  History of pulmonary embolism -Continue full dose Lovenox  Paroxysmal atrial fibrillation -Currently in sinus rhythm. Previous episodes of intermittent rapid ventricular response  suspect significant pulmonary disease burden from her metastasis contributing to A. fib RVR -Continue current regimen of amiodarone and metoprolol  Chronic pain -Secondary to metastatic cancer -Continue fentanyl patch and oxycodone per home regimen  Code status Full Code Communication: daughter at bedside Disposition Plan: to be determined  Consultants:   Oncology Dr. Sonny Dandy   Subjective: Continues to be short of breath, very poor PO intake  subjective: Vitals:   05/10/17 0445 05/10/17 1500 05/10/17 2044 05/11/17 0520  BP: 116/71 122/74 110/72 110/75  Pulse: (!) 102 89 97 (!) 104  Resp: (!) 22 (!) 22 (!) 200 20  Temp: 98.7 F (37.1 C) 98.2 F (36.8 C) 98.8 F (37.1 C) 98.2 F (36.8 C)  TempSrc: Oral Oral Oral Oral  SpO2: 99% 100% 100% 99%  Weight: 87.2 kg (192 lb 3.9 oz)     Height:        Intake/Output Summary (Last 24 hours) at 05/11/17 1303 Last data filed at 05/10/17 1900  Gross per 24 hour  Intake               60 ml  Output              500 ml  Net             -440 ml   Filed Weights   05/08/17 2120 05/09/17 0137 05/10/17 0445  Weight: 83 kg (183 lb) 88.1 kg (194 lb 3.6 oz) 87.2 kg (192 lb 3.9 oz)    Examination:  Gen: chronically ill female, dyspneic, uncomfortable HEENT: PERRLA, Neck supple, no JVD Lungs:few ronchi diffusely CVS: RRR,No Gallops,Rubs or new Murmurs Abd: soft, Non tender, non distended, BS present Extremities: No Cyanosis, Clubbing or edema Skin: no new rashes   Data Reviewed:   CBC:  Recent Labs  Lab 05/08/17 2209 05/08/17 2228 05/09/17 0523 05/10/17 0531  WBC 12.9*  --  12.3* 10.6*  NEUTROABS 10.0*  --   --   --   HGB 7.8* 8.8* 7.7* 8.2*  HCT 24.6* 26.0* 24.1* 25.7*  MCV 84.2  --  82.8 86.2  PLT 478*  --  490* 664*   Basic Metabolic Panel:  Recent Labs Lab 05/08/17 2209 05/08/17 2228 05/09/17 0523 05/10/17 0531  NA 126* 125* 132* 130*  K 3.9 3.9 3.9 3.9  CL 87* 86* 90* 88*  CO2 28  --  30 30  GLUCOSE 113*  114* 109* 99  BUN 32* 28* 32* 31*  CREATININE 1.34* 1.30* 1.33* 1.21*  CALCIUM 9.4  --  9.6 9.2   GFR: Estimated Creatinine Clearance: 67.7 mL/min (A) (by C-G formula based on SCr of 1.21 mg/dL (H)). Liver Function Tests:  Recent Labs Lab 05/08/17 2209 05/10/17 0531  AST 121* 111*  ALT 126* 108*  ALKPHOS 78 74  BILITOT 0.8 0.8  PROT 6.9 6.5  ALBUMIN 2.0* 1.9*   No results for input(s): LIPASE, AMYLASE in the last 168 hours. No results for input(s): AMMONIA in the last 168 hours. Coagulation Profile:  Recent Labs Lab 05/08/17 2209  INR 1.22   Cardiac Enzymes: No results for input(s): CKTOTAL, CKMB, CKMBINDEX, TROPONINI in the last 168 hours. BNP (last 3 results) No results for input(s): PROBNP in the last 8760 hours. HbA1C: No results for input(s): HGBA1C in the last 72 hours. CBG: No results for input(s): GLUCAP in the last 168 hours. Lipid Profile: No results for input(s): CHOL, HDL, LDLCALC, TRIG, CHOLHDL, LDLDIRECT in the last 72 hours. Thyroid Function Tests: No results for input(s): TSH, T4TOTAL, FREET4, T3FREE, THYROIDAB in the last 72 hours. Anemia Panel: No results for input(s): VITAMINB12, FOLATE, FERRITIN, TIBC, IRON, RETICCTPCT in the last 72 hours. Urine analysis:    Component Value Date/Time   COLORURINE YELLOW 05/09/2017 [place] 05/09/2017 0942   LABSPEC 1.010 05/09/2017 0942   LABSPEC 1.010 04/10/2016 1224   PHURINE 5.0 05/09/2017 0942   GLUCOSEU NEGATIVE 05/09/2017 0942   GLUCOSEU Negative 04/10/2016 1224   HGBUR NEGATIVE 05/09/2017 0942   BILIRUBINUR NEGATIVE 05/09/2017 0942   BILIRUBINUR Negative 04/10/2016 1224   KETONESUR NEGATIVE 05/09/2017 0942   PROTEINUR NEGATIVE 05/09/2017 0942   UROBILINOGEN 0.2 04/10/2016 1224   NITRITE NEGATIVE 05/09/2017 0942   LEUKOCYTESUR NEGATIVE 05/09/2017 0942   LEUKOCYTESUR Negative 04/10/2016 1224   Sepsis Labs: @LABRCNTIP (procalcitonin:4,lacticidven:4)  ) Recent Results (from the  past 240 hour(s))  Culture, blood (Routine x 2)     Status: None (Preliminary result)   Collection Time: 05/08/17 10:00 PM  Result Value Ref Range Status   Specimen Description BLOOD PORTA CATH  Final   Special Requests   Final    BOTTLES DRAWN AEROBIC AND ANAEROBIC Blood Culture adequate volume   Culture   Final    NO GROWTH 2 DAYS Performed at [facility], [place] [address] [place], [place] [address]    Report Status PENDING  Incomplete  Culture, blood (Routine x 2)     Status: None (Preliminary result)   Collection Time: 05/09/17  2:01 AM  Result Value Ref Range Status   Specimen Description BLOOD RIGHT ANTECUBITAL  Final   Special Requests   Final    BOTTLES DRAWN AEROBIC AND ANAEROBIC Blood Culture adequate volume   Culture   Final    NO GROWTH 2 DAYS Performed at [facility], [place] [address], [place], [place] [address]    Report Status PENDING  Incomplete         Radiology Studies: No results found.      Scheduled Meds: . amiodarone  200 mg Oral Daily  . cholecalciferol  1,000 Units Oral Daily  . digoxin  0.125 mg Oral Daily  . enoxaparin (LOVENOX) injection  1 mg/kg Subcutaneous Q12H  . fentaNYL  50 mcg Transdermal Q72H  . ferrous sulfate  325 mg Oral Q breakfast  . furosemide  40 mg Intravenous Q12H  . mouth rinse  15 mL Mouth Rinse BID  . metoprolol succinate  12.5 mg Oral Daily  . multivitamin  15 mL Oral Daily  . polyethylene glycol  17 g Oral BID  . potassium chloride SA  20 mEq Oral BID  . protein supplement shake  11 oz Oral Q24H  . sodium chloride flush  10-40 mL Intracatheter Q12H   Continuous Infusions: . sodium chloride       LOS: 2 days    Time spent: 67min    Domenic Polite, MD [facility] Pager [phone]  If 7PM-7AM, please contact night-coverage [email or web address] Password [email or web address] 05/11/2017, 1:03 PM

## 2017-05-12 DIAGNOSIS — Z7189 Other specified counseling: Secondary | ICD-10-CM

## 2017-05-12 MED ORDER — FUROSEMIDE 10 MG/ML IJ SOLN
20.0000 mg | Freq: Every day | INTRAMUSCULAR | Status: DC
  Administered 2017-05-13: 20 mg via INTRAVENOUS
  Filled 2017-05-12: qty 2

## 2017-05-12 MED ORDER — HYDROMORPHONE HCL 1 MG/ML PO LIQD
2.0000 mg | ORAL | Status: DC | PRN
  Administered 2017-05-12: 2 mg via ORAL
  Administered 2017-05-13: 4 mg via ORAL
  Administered 2017-05-13: 2 mg via ORAL
  Administered 2017-05-14: 4 mg via ORAL
  Administered 2017-05-14: 2 mg via ORAL
  Filled 2017-05-12: qty 4
  Filled 2017-05-12: qty 2
  Filled 2017-05-12: qty 4
  Filled 2017-05-12 (×2): qty 2
  Filled 2017-05-12: qty 4

## 2017-05-12 MED ORDER — FENTANYL 75 MCG/HR TD PT72
75.0000 ug | MEDICATED_PATCH | TRANSDERMAL | Status: DC
  Administered 2017-05-14: 75 ug via TRANSDERMAL
  Filled 2017-05-12: qty 1

## 2017-05-12 MED ORDER — DIPHENHYDRAMINE HCL 50 MG/ML IJ SOLN: 12.5000 mg | Freq: Four times a day (QID) | INTRAMUSCULAR | Status: DC | PRN

## 2017-05-12 MED ORDER — HYDROXYZINE HCL 10 MG PO TABS
10.0000 mg | ORAL_TABLET | Freq: Three times a day (TID) | ORAL | Status: DC | PRN
  Filled 2017-05-12: qty 1

## 2017-05-12 MED ORDER — LIDOCAINE 5 % EX PTCH
1.0000 | MEDICATED_PATCH | CUTANEOUS | Status: DC
  Administered 2017-05-12 – 2017-05-14 (×3): 1 via TRANSDERMAL
  Filled 2017-05-12 (×3): qty 1

## 2017-05-12 MED ORDER — HYDROMORPHONE HCL-NACL 0.5-0.9 MG/ML-% IV SOSY
1.5000 mg | PREFILLED_SYRINGE | INTRAVENOUS | Status: DC | PRN
Start: 2017-05-12 — End: 2017-05-14
  Administered 2017-05-12 – 2017-05-14 (×9): 1.5 mg via INTRAVENOUS
  Filled 2017-05-12 (×9): qty 3

## 2017-05-12 NOTE — Progress Notes (Signed)
[place] for Enoxaparin Indication: history of pulmonary embolus and a-fib  Allergies  Allergen Reactions  . Dilaudid [Hydromorphone Hcl] Itching  . Ivp Dye [Iodinated Diagnostic Agents] Itching  . Percocet [Oxycodone-Acetaminophen] Itching  . Tape Itching and Rash    Hypofix causes rash  . Toradol [Ketorolac Tromethamine] Itching    Patient Measurements: Height: 5\' 9"  (175.3 cm) Weight: 193 lb 12.6 oz (87.9 kg) IBW/kg (Calculated) : 66.2   Vital Signs: Temp: 97.9 F (36.6 C) (09/09 0540) Temp Source: Oral (09/09 0540) BP: 119/77 (09/09 0540) Pulse Rate: 106 (09/09 0540)  Labs:  Recent Labs  05/10/17 0531  HGB 8.2*  HCT 25.7*  PLT 468*  CREATININE 1.21*    Estimated Creatinine Clearance: 68 mL/min (A) (by C-G formula based on SCr of 1.21 mg/dL (H)).   Medical History: Past Medical History:  Diagnosis Date  . Arthritis   . Asthma   . Back disorder    degenerative disk disease  . Brain metastases ([place]) dx'd 2017  . Breast CA ([place]) dx'd 2016   left  . Breast cancer ([place]) dx'd 2017   right   . Cardiomyopathy ([place])    a. Dx 02/2017 - EF 25-30%, felt possibly due to adriamycin +/- tachy mediated from atrial fib/flutter.  [place] GERD (gastroesophageal reflux disease)   . Mammogram abnormal 08/24/15   first  . Metastatic cancer to lung ([place]) dx'd 11/2016  . Migraine   . Normal coronary arteries    01/2016 cath  . PAF (paroxysmal atrial fibrillation) ([place])   . Pap smear for cervical cancer screening 08/12/15  . Paroxysmal atrial flutter ([facility])     Assessment: 50 yoF with PMH of metastatic breast cancer admitted on 05/09/2017 with c/o SOB, CP and leg swelling. Patient on Enoxaparin 100mg  SQ q12h PTA for hx of PE and a-fib. Pharmacy consulted to resume Enoxaparin while patient admitted.    SCr elevated, but improved to 1.21 on 9/7, CrCl ~ 68 ml/min  CBC: Hgb improved to 8.2 (9/7) s/p transfusion of 1 unit PRBCs 9/6, Pltc  elevated  No bleeding issues reported  MRI of brain without contrast on admission showed no acute infarct or acute hemorrhage  Goal of Therapy:  Anti-Xa level 0.6-1 units/ml 4hrs after LMWH dose given Monitor platelets by anticoagulation protocol: yes   Plan:  Continue Lovenox 90mg  SQ q12h  Follow-up SCr, CBC in AM  Monitor closely for s/sx of bleeding Follow-up GOC   Lindell Spar, PharmD, BCPS Pager: [phone] 05/12/2017 9:10 AM

## 2017-05-12 NOTE — Progress Notes (Signed)
PROGRESS NOTE    Cindy Bradshaw  XBM:[id number] DOB: 01-03-1971 DOA: 05/08/2017 PCP: [name], [place]  Brief Narrative:Cindy Bradshaw is a 46 y.o. female with history of metastatic breast cancer presents to the ER with worsening shortness of breath over the last few days. Just Dced 9/1, 5th admission in 44months.  Assessment & Plan:   Acute hypoxic respiratory failure -Secondary to progression of pulmonary metastasis, CT scan- on admission shows rapid progression of pulmonary metastasis and adenopathy -s/p 1 unit PRBC and lasix -continue IV lasix , change to 20mg  daily due to soft BPs  Widely metastatic breast cancer -Extensive pulmonary metastasis, bone metastases, liver mets, subcut nodules -MRI done this morning is concerning for cerebellar cystic lesion possibly metastatic and osseous metastasis as well -Patient's prognosis appears to be very poor and functional status is also very poor -This is her fifth admission in 3 months, she has been seen by palliative care during the past 2 hospitalizations -She was given a dose of immunotherapy-Pembrolizumab, and a couple of weeks back at [place] 9/6 shows rapid progression of mediastinal adenopathy, pulmonary metastasis, liver metastasis, and right breast masses -Seen by [name] 9/6, he recommended Hospice and didn't feel that he had anything further to offer her -patient continues to remain unrealistic  -Palliative re consulted, s/p goals of care meeting, appreciate palliative help with this challenging situation -today 9/9 when I brought up resuscitation again, Cindy Bradshaw agrees to [employer] code with No CPR/defib and No Intubation will change order -they remain hopeful to move to [place] and seek Rx there  Anemia -Acute and chronic -Secondary to chronic disease, rapidly progressing malignancy -s/p 1 unit PRBC, Hb improved  Acute on chronic systolic CHF/nonischemic cardiomyopathy -Ejection fraction 25% -Related to  chemotherapy (Doxorubicin/adrimicin) -with minimal improvement with diuretics -continue lasix at lower dose  History of pulmonary embolism -Continue full dose Lovenox  Paroxysmal atrial fibrillation -Currently in sinus rhythm. Previous episodes of intermittent rapid ventricular response suspect significant pulmonary disease burden from her metastasis contributing to A. fib RVR -Continue current regimen of amiodarone and metoprolol  Chronic pain -Secondary to metastatic cancer -Continue fentanyl patch and oxycodone per home regimen, also on high dose morphine now, Palliative to try to consolidate meds now  Code status Full Code Communication: daughter at bedside Disposition Plan: to be determined  Consultants:   Oncology Dr. Sonny Dandy   Subjective: -still with dyspnea, poor PO  subjective: Vitals:   05/11/17 2010 05/12/17 0540 05/12/17 0640 05/12/17 1246  BP: 120/71 119/77  107/61  Pulse: 100 (!) 106  95  Resp:  18  18  Temp: 99 F (37.2 C) 97.9 F (36.6 C)  98.1 F (36.7 C)  TempSrc: Oral Oral  Oral  SpO2: 97% 100%  100%  Weight:   87.9 kg (193 lb 12.6 oz)   Height:        Intake/Output Summary (Last 24 hours) at 05/12/17 1251 Last data filed at 05/12/17 0900  Gross per 24 hour  Intake             1200 ml  Output             2751 ml  Net            -1551 ml   Filed Weights   05/09/17 0137 05/10/17 0445 05/12/17 0640  Weight: 88.1 kg (194 lb 3.6 oz) 87.2 kg (192 lb 3.9 oz) 87.9 kg (193 lb 12.6 oz)    Examination:  Gen: chronically ill  female, debilitated HEENT: PERRLA, Neck supple, no JVD Lungs: decreased BS at bases CVS: RRR,No Gallops,Rubs or new Murmurs Abd: soft, Non tender, non distended, BS present Extremities: No Cyanosis, Clubbing or edema Skin: no new rashes   Data Reviewed:   CBC:  Recent Labs Lab 05/08/17 2209 05/08/17 2228 05/09/17 0523 05/10/17 0531  WBC 12.9*  --  12.3* 10.6*  NEUTROABS 10.0*  --   --   --   HGB 7.8* 8.8* 7.7*  8.2*  HCT 24.6* 26.0* 24.1* 25.7*  MCV 84.2  --  82.8 86.2  PLT 478*  --  490* 086*   Basic Metabolic Panel:  Recent Labs Lab 05/08/17 2209 05/08/17 2228 05/09/17 0523 05/10/17 0531  NA 126* 125* 132* 130*  K 3.9 3.9 3.9 3.9  CL 87* 86* 90* 88*  CO2 28  --  30 30  GLUCOSE 113* 114* 109* 99  BUN 32* 28* 32* 31*  CREATININE 1.34* 1.30* 1.33* 1.21*  CALCIUM 9.4  --  9.6 9.2   GFR: Estimated Creatinine Clearance: 68 mL/min (A) (by C-G formula based on SCr of 1.21 mg/dL (H)). Liver Function Tests:  Recent Labs Lab 05/08/17 2209 05/10/17 0531  AST 121* 111*  ALT 126* 108*  ALKPHOS 78 74  BILITOT 0.8 0.8  PROT 6.9 6.5  ALBUMIN 2.0* 1.9*   No results for input(s): LIPASE, AMYLASE in the last 168 hours. No results for input(s): AMMONIA in the last 168 hours. Coagulation Profile:  Recent Labs Lab 05/08/17 2209  INR 1.22   Cardiac Enzymes: No results for input(s): CKTOTAL, CKMB, CKMBINDEX, TROPONINI in the last 168 hours. BNP (last 3 results) No results for input(s): PROBNP in the last 8760 hours. HbA1C: No results for input(s): HGBA1C in the last 72 hours. CBG: No results for input(s): GLUCAP in the last 168 hours. Lipid Profile: No results for input(s): CHOL, HDL, LDLCALC, TRIG, CHOLHDL, LDLDIRECT in the last 72 hours. Thyroid Function Tests: No results for input(s): TSH, T4TOTAL, FREET4, T3FREE, THYROIDAB in the last 72 hours. Anemia Panel: No results for input(s): VITAMINB12, FOLATE, FERRITIN, TIBC, IRON, RETICCTPCT in the last 72 hours. Urine analysis:    Component Value Date/Time   COLORURINE YELLOW 05/09/2017 [place] 05/09/2017 0942   LABSPEC 1.010 05/09/2017 0942   LABSPEC 1.010 04/10/2016 1224   PHURINE 5.0 05/09/2017 0942   GLUCOSEU NEGATIVE 05/09/2017 0942   GLUCOSEU Negative 04/10/2016 1224   HGBUR NEGATIVE 05/09/2017 0942   BILIRUBINUR NEGATIVE 05/09/2017 0942   BILIRUBINUR Negative 04/10/2016 1224   KETONESUR NEGATIVE  05/09/2017 0942   PROTEINUR NEGATIVE 05/09/2017 0942   UROBILINOGEN 0.2 04/10/2016 1224   NITRITE NEGATIVE 05/09/2017 0942   LEUKOCYTESUR NEGATIVE 05/09/2017 0942   LEUKOCYTESUR Negative 04/10/2016 1224   Sepsis Labs: @LABRCNTIP (procalcitonin:4,lacticidven:4)  ) Recent Results (from the past 240 hour(s))  Culture, blood (Routine x 2)     Status: None (Preliminary result)   Collection Time: 05/08/17 10:00 PM  Result Value Ref Range Status   Specimen Description BLOOD PORTA CATH  Final   Special Requests   Final    BOTTLES DRAWN AEROBIC AND ANAEROBIC Blood Culture adequate volume   Culture   Final    NO GROWTH 3 DAYS Performed at [facility], [place] [address], [place], [place] [address]    Report Status PENDING  Incomplete  Culture, blood (Routine x 2)     Status: None (Preliminary result)   Collection Time: 05/09/17  2:01 AM  Result Value Ref  Range Status   Specimen Description BLOOD RIGHT ANTECUBITAL  Final   Special Requests   Final    BOTTLES DRAWN AEROBIC AND ANAEROBIC Blood Culture adequate volume   Culture   Final    NO GROWTH 3 DAYS Performed at [facility], [address] [place], [place] [address]    Report Status PENDING  Incomplete         Radiology Studies: No results found.      Scheduled Meds: . amiodarone  200 mg Oral Daily  . cholecalciferol  1,000 Units Oral Daily  . digoxin  0.125 mg Oral Daily  . enoxaparin (LOVENOX) injection  1 mg/kg Subcutaneous Q12H  . [START ON 05/14/2017] fentaNYL  75 mcg Transdermal Q72H  . ferrous sulfate  325 mg Oral Q breakfast  . furosemide  40 mg Intravenous Q12H  . lidocaine  1 patch Transdermal Q24H  . mouth rinse  15 mL Mouth Rinse BID  . metoprolol succinate  12.5 mg Oral Daily  . multivitamin  15 mL Oral Daily  . polyethylene glycol  17 g Oral BID  . potassium chloride SA  20 mEq Oral BID  . protein supplement shake  11 oz Oral Q24H  . sodium chloride flush  10-40 mL Intracatheter Q12H    Continuous Infusions: . sodium chloride       LOS: 3 days    Time spent: 103min    Domenic Polite, MD [facility] Pager [phone]  If 7PM-7AM, please contact night-coverage [email or web address] Password [email or web address] 05/12/2017, 12:51 PM

## 2017-05-12 NOTE — Progress Notes (Signed)
Daily Progress Note   Patient Name: Cindy Bradshaw. Ailes       Date: 05/12/2017 DOB: 28-Jun-1971  Age: 46 y.o. MRN#: [id number] Attending Physician: Domenic Polite, MD Primary Care Physician: [name], MD Admit Date: 05/08/2017  Reason for Consultation/Follow-up: Establishing goals of care, pain and non pain management, psychosocial support.   Subjective:  Cindy Bradshaw has severe pain in her lower back, her pain needs are escalating, she is not eating well, she is awake alert, sitting up by the side of her bed There is no family at bedside, call placed and discussed with daughter Cindy Cindy Bradshaw, see below:   Length of Stay: 3  Current Medications: Scheduled Meds:  . amiodarone  200 mg Oral Daily  . cholecalciferol  1,000 Units Oral Daily  . digoxin  0.125 mg Oral Daily  . enoxaparin (LOVENOX) injection  1 mg/kg Subcutaneous Q12H  . fentaNYL  50 mcg Transdermal Q72H  . ferrous sulfate  325 mg Oral Q breakfast  . furosemide  40 mg Intravenous Q12H  . lidocaine  1 patch Transdermal Q24H  . mouth rinse  15 mL Mouth Rinse BID  . metoprolol succinate  12.5 mg Oral Daily  . multivitamin  15 mL Oral Daily  . polyethylene glycol  17 g Oral BID  . potassium chloride SA  20 mEq Oral BID  . protein supplement shake  11 oz Oral Q24H  . sodium chloride flush  10-40 mL Intracatheter Q12H    Continuous Infusions: . sodium chloride      PRN Meds: acetaminophen **OR** acetaminophen, albuterol, albuterol, ALPRAZolam, diphenhydrAMINE, guaiFENesin-dextromethorphan, HYDROmorphone (DILAUDID) injection, HYDROmorphone HCl, hydrOXYzine, lactulose, methocarbamol, ondansetron **OR** ondansetron (ZOFRAN) IV, senna, sodium chloride flush  Physical Exam         Awake weak Shallow regular pattern of  breathing S1 S2 Abdomen non tender Trace edema Has pain in lower back area  Vital Signs: BP 119/77 (BP Location: Right Arm)   Pulse (!) 106   Temp 97.9 F (36.6 C) (Oral)   Resp 18   Ht 5\' 9"  (1.753 m)   Wt 87.9 kg (193 lb 12.6 oz)   SpO2 100%   BMI 28.62 kg/m  SpO2: SpO2: 100 % O2 Device: O2 Device: Nasal Cannula O2 Flow Rate: O2 Flow Rate (L/min): 3 L/min  Intake/output summary:  Intake/Output  Summary (Last 24 hours) at 05/12/17 0935 Last data filed at 05/12/17 0900  Gross per 24 hour  Intake             1200 ml  Output             2751 ml  Net            -1551 ml   LBM: Last BM Date: 05/11/17 Baseline Weight: Weight: 83 kg (183 lb) Most recent weight: Weight: 87.9 kg (193 lb 12.6 oz)       Palliative Assessment/Data:    Flowsheet Rows     Most Recent Value  Intake Tab  Referral Department  Hospitalist  Unit at Time of Referral  Oncology Unit  Palliative Care Primary Diagnosis  Cancer  Date Notified  05/10/17  Palliative Care Type  New Palliative care  Reason for referral  Pain, Clarify Goals of Care, Counsel Regarding Hospice  Date of Admission  05/08/17  Date first seen by Palliative Care  05/11/17  # of days IP prior to Palliative referral  2  Clinical Assessment  Palliative Performance Scale Score  30%  Pain Max last 24 hours  5  Pain Min Last 24 hours  4  Dyspnea Max Last 24 Hours  4  Dyspnea Min Last 24 hours  3  Nausea Max Last 24 Hours  2  Nausea Min Last 24 Hours  1  Anxiety Max Last 24 Hours  4  Anxiety Min Last 24 Hours  3  Psychosocial & Spiritual Assessment  Palliative Care Outcomes  Patient/Family meeting held?  Yes  Who was at the meeting?  patient daughter   Palliative Care Outcomes  Clarified goals of care      Patient Active Problem List   Diagnosis Date Noted  . Goals of care, counseling/discussion   . Generalized pain   . Acute respiratory failure with hypoxia ([place]) 05/09/2017  . AKI (acute kidney injury) ([place]) 05/01/2017   . Chronic respiratory failure ([place]) 05/01/2017  . Digitalis toxicity 05/01/2017  . Toxic encephalopathy 05/01/2017  . Thrombocytosis ([place]) 05/01/2017  . Leucocytosis 05/01/2017  . Encounter for palliative care   . Acute on chronic combined systolic and diastolic heart failure ([place])   . Asthma, chronic, unspecified asthma severity, with acute exacerbation 04/11/2017  . Acute respiratory failure ([place]) 04/11/2017  . Hypoxia 04/04/2017  . Dyspnea 02/23/2017  . Acute pulmonary embolism ([place]) 02/23/2017  . Cardiomyopathy ([place])   . A-fib ([place]) 02/13/2017  . Metastatic breast cancer ([place])   . DCM (dilated cardiomyopathy) ([place])   . Atrial fibrillation with RVR ([place]) 02/11/2017  . Chest pain   . Adnexal cyst: Right per CT 12/09/2015 12/12/2015  . Chemotherapy-induced peripheral neuropathy ([place]) 12/05/2015  . Normocytic normochromic anemia 09/19/2015  . Breast cancer of upper-outer quadrant of left female breast ([place]) 09/07/2015    Palliative Care Assessment & Plan   Patient Profile:  Cindy. Bradshaw is a 45 year old lady with metastatic breast cancer. She has pulmonary, bone, liver metastases take disease. She came in with shortness of breath and is found to have progression of her pulmonary metastatic disease. She received immunotherapy recently. She has been followed by Dr. Lindi Adie recently. She received immunotherapy recently. However, she has ongoing extensive metastatic burden. MRI shows cerebellar cystic lesion, possibly metastatic in OCS metastases as well. Additionally, CT scan shows rapid progression of mediastinal adenopathy and right breast masses.  Palliative consultation for goals of care discussions.  Assessment:  back pain Generalized pain  Cancer related anorexia and cachexia Existential suffering   Recommendations/Plan: Pain management: Patient has required 8 mg IV Morphine times 8 in the past 24 hours, she has 50 mcg TDF patch.   Will rotate to IV and PO Dilaudid and monitor,  Dilaudid is not a true allergy, patient is not sure if she is allergic to Dilaudid, she believes she may have taken Dilaudid in the past. Will increase strength of TDF as well. Will add lidoderm patch for back pain. Continue Robaxin.   Will add atarax and benadryl prn to be used if there is itching with Dilaudid use, will also help with anxiety.   Chaplain consult to complete advanced directives  Code status discussions again held in detail, patient may be leaning towards DNR DNI. Call placed and discussed with daughter, she wishes to discuss with her mother again regarding not being full code anymore. We will follow along and help guide appropriate decision making.   Goals of care:   Patient's sister is coming in from [place] next week, patient wants to go with her sister to [place], [place], seek second opinion for her cancer over there, she believes that doctors in [place] might be able to treat her disease. She has more help over there in [place], as her sister can help her and be with her 24*7.   There are ongoing discussions about how hospice can help Cindy Bittinger at this stage. We will continue to support this family.     Code Status:    Code Status Orders        Start     Ordered   05/09/17 0032  Full code  Continuous     05/09/17 0031    Code Status History    Date Active Date Inactive Code Status Order ID Comments User Context   05/01/2017  5:34 PM 05/04/2017  5:46 PM Full Code [id number]  Charlynne Cousins, MD Inpatient   04/11/2017  6:31 PM 04/27/2017  7:51 PM Full Code [id number]  Domenic Polite, MD ED   02/23/2017  8:44 AM 02/26/2017  6:00 PM Full Code [id number]  Radene Gunning, NP ED   02/11/2017  4:13 PM 02/14/2017  6:33 PM Full Code [id number]  Robbie Lis, MD Inpatient   02/21/2016  4:25 PM 02/22/2016 11:29 AM Full Code [id number]  Irene Limbo, MD Inpatient   01/21/2016  6:36 PM 01/25/2016  3:06 PM Full Code [id number]  Debbe Odea, MD ED   12/09/2015  1:58 AM 12/12/2015   4:32 PM Full Code [id number]  Ivor Costa, MD ED       Prognosis:   guarded, could be less than 2-3 weeks at this point.   Discharge Planning:  Pending.   Care plan was discussed with patient, daughter, RN, Dr [facility]us [facility] MD.   Thank you for allowing the Palliative Medicine Team to assist in the care of this patient.   Time In: 9 Time Out: 9.35 Total Time 35 Prolonged Time Billed  no       Greater than 50%  of this time was spent counseling and coordinating care related to the above assessment and plan.  Loistine Chance, MD [phone]  Please contact Palliative Medicine Team phone at [phone] for questions and concerns.

## 2017-05-13 DIAGNOSIS — R52 Pain, unspecified: Secondary | ICD-10-CM

## 2017-05-13 LAB — CBC
HCT: 27.1 % — ABNORMAL LOW (ref 36.0–46.0)
Hemoglobin: 8.4 g/dL — ABNORMAL LOW (ref 12.0–15.0)
MCH: 26.9 pg (ref 26.0–34.0)
MCHC: 31 g/dL (ref 30.0–36.0)
MCV: 86.9 fL (ref 78.0–100.0)
PLATELETS: 474 10*3/uL — AB (ref 150–400)
RBC: 3.12 MIL/uL — ABNORMAL LOW (ref 3.87–5.11)
RDW: 18.3 % — AB (ref 11.5–15.5)
WBC: 16.6 10*3/uL — ABNORMAL HIGH (ref 4.0–10.5)

## 2017-05-13 LAB — CREATININE, SERUM
Creatinine, Ser: 0.83 mg/dL (ref 0.44–1.00)
GFR calc Af Amer: >60 mL/min (ref >60)
GFR calc non Af Amer: >60 mL/min (ref >60)

## 2017-05-13 MED ORDER — FUROSEMIDE 40 MG PO TABS
80.0000 mg | ORAL_TABLET | Freq: Every day | ORAL | Status: DC
  Administered 2017-05-13 – 2017-05-14 (×2): 80 mg via ORAL
  Filled 2017-05-13 (×2): qty 2

## 2017-05-13 NOTE — Progress Notes (Signed)
Received call from Insurance CM-concerns about readmissions, & options-hospice. I informed that patient has declined hospice services. Went into patient's rm to discuss [place] @ d/c-patient asked to talk to Chaplain-I have paged the chaplain. Continue to monitor progress. Current d/c plan home w/[employer]-[employer] rep Santiago Glad aware of orders.

## 2017-05-13 NOTE — Progress Notes (Signed)
Daily Progress Note   Patient Name: Cindy Bradshaw       Date: 05/13/2017 DOB: November 18, 1970  Age: 46 y.o. MRN#: [id number] Attending Physician: Domenic Polite, MD Primary Care Physician: [name], MD Admit Date: 05/08/2017  Reason for Consultation/Follow-up: Establishing goals of care, pain and non pain management, psychosocial support.   Subjective:  Cindy Bradshaw is sitting up in chair, I met with her daughter Cindy Bradshaw who was also in the room Patient is asking for a [place] suction to be set up. She appears weak. We reviewed her condition in detail again.  See below    Length of Stay: 4  Current Medications: Scheduled Meds:  . amiodarone  200 mg Oral Daily  . cholecalciferol  1,000 Units Oral Daily  . digoxin  0.125 mg Oral Daily  . enoxaparin (LOVENOX) injection  1 mg/kg Subcutaneous Q12H  . [START ON 05/14/2017] fentaNYL  75 mcg Transdermal Q72H  . ferrous sulfate  325 mg Oral Q breakfast  . furosemide  80 mg Oral Daily  . lidocaine  1 patch Transdermal Q24H  . mouth rinse  15 mL Mouth Rinse BID  . metoprolol succinate  12.5 mg Oral Daily  . multivitamin  15 mL Oral Daily  . polyethylene glycol  17 g Oral BID  . potassium chloride SA  20 mEq Oral BID  . protein supplement shake  11 oz Oral Q24H  . sodium chloride flush  10-40 mL Intracatheter Q12H    Continuous Infusions: . sodium chloride      PRN Meds: acetaminophen **OR** acetaminophen, albuterol, albuterol, ALPRAZolam, diphenhydrAMINE, guaiFENesin-dextromethorphan, HYDROmorphone (DILAUDID) injection, HYDROmorphone HCl, hydrOXYzine, lactulose, methocarbamol, ondansetron **OR** ondansetron (ZOFRAN) IV, senna, sodium chloride flush  Physical Exam         Awake weak Shallow regular pattern of breathing S1  S2 Abdomen non tender Trace edema   Vital Signs: BP 112/69 (BP Location: Right Arm)   Pulse (!) 107   Temp 99.4 F (37.4 C) (Oral)   Resp 20   Ht 5' 9"  (1.753 m)   Wt 86.9 kg (191 lb 9.3 oz)   SpO2 100%   BMI 28.29 kg/m  SpO2: SpO2: 100 % O2 Device: O2 Device: Nasal Cannula O2 Flow Rate: O2 Flow Rate (L/min): 3 L/min  Intake/output summary:   Intake/Output Summary (Last 24  hours) at 05/13/17 1448 Last data filed at 05/13/17 0600  Gross per 24 hour  Intake              360 ml  Output                0 ml  Net              360 ml   LBM: Last BM Date: 05/12/17 Baseline Weight: Weight: 83 kg (183 lb) Most recent weight: Weight: 86.9 kg (191 lb 9.3 oz)       Palliative Assessment/Data:    Flowsheet Rows     Most Recent Value  Intake Tab  Referral Department  Hospitalist  Unit at Time of Referral  Oncology Unit  Palliative Care Primary Diagnosis  Cancer  Date Notified  05/10/17  Palliative Care Type  New Palliative care  Reason for referral  Pain, Clarify Goals of Care, Counsel Regarding Hospice  Date of Admission  05/08/17  Date first seen by Palliative Care  05/11/17  # of days IP prior to Palliative referral  2  Clinical Assessment  Palliative Performance Scale Score  30%  Pain Max last 24 hours  5  Pain Min Last 24 hours  4  Dyspnea Max Last 24 Hours  4  Dyspnea Min Last 24 hours  3  Nausea Max Last 24 Hours  2  Nausea Min Last 24 Hours  1  Anxiety Max Last 24 Hours  4  Anxiety Min Last 24 Hours  3  Psychosocial & Spiritual Assessment  Palliative Care Outcomes  Patient/Family meeting held?  Yes  Who was at the meeting?  patient daughter   Palliative Care Outcomes  Clarified goals of care      Patient Active Problem List   Diagnosis Date Noted  . Goals of care, counseling/discussion   . Generalized pain   . Acute respiratory failure with hypoxia ([place]) 05/09/2017  . AKI (acute kidney injury) ([place]) 05/01/2017  . Chronic respiratory failure ([place])  05/01/2017  . Digitalis toxicity 05/01/2017  . Toxic encephalopathy 05/01/2017  . Thrombocytosis ([place]) 05/01/2017  . Leucocytosis 05/01/2017  . Encounter for palliative care   . Acute on chronic combined systolic and diastolic heart failure ([place])   . Asthma, chronic, unspecified asthma severity, with acute exacerbation 04/11/2017  . Acute respiratory failure ([place]) 04/11/2017  . Hypoxia 04/04/2017  . Dyspnea 02/23/2017  . Acute pulmonary embolism ([place]) 02/23/2017  . Cardiomyopathy ([place])   . A-fib ([place]) 02/13/2017  . Metastatic breast cancer ([place])   . DCM (dilated cardiomyopathy) ([place])   . Atrial fibrillation with RVR ([place]) 02/11/2017  . Chest pain   . Adnexal cyst: Right per CT 12/09/2015 12/12/2015  . Chemotherapy-induced peripheral neuropathy ([place]) 12/05/2015  . Normocytic normochromic anemia 09/19/2015  . Breast cancer of upper-outer quadrant of left female breast ([place]) 09/07/2015    Palliative Care Assessment & Plan   Patient Profile:  Cindy Bradshaw is a 46 year old lady with metastatic breast cancer. She has pulmonary, bone, liver metastases take disease. She came in with shortness of breath and is found to have progression of her pulmonary metastatic disease. She received immunotherapy recently. She has been followed by Dr. Lindi Adie recently. She received immunotherapy recently. However, she has ongoing extensive metastatic burden. MRI shows cerebellar cystic lesion, possibly metastatic in OCS metastases as well. Additionally, CT scanPatient has required 8 mg IV Morphine times 8 in the past 24 hours, she has 50 mcg TDF patch.   shows  rapid progression of mediastinal adenopathy and right breast masses.  Palliative consultation for goals of care discussions.  Assessment:  back pain Generalized pain Cancer related anorexia and cachexia Existential suffering   Recommendations/Plan: Pain management: TDF. Also on PO and PRN IV Dilaudid. Also has Lidoderm patch and Robaxin.   Anxiety:   Patient is on Atarax and also has PRN benadryl  to be used if there is itching with Dilaudid use, will also help with anxiety.   Chaplain consult to complete advanced directives   will likely D/C home with home health care, to go to [place] this weekend for second opinion and to be closer to family.     Code Status:    Code Status Orders        Start     Ordered   05/09/17 0032  Full code  Continuous     05/09/17 0031    Code Status History    Date Active Date Inactive Code Status Order ID Comments User Context   05/01/2017  5:34 PM 05/04/2017  5:46 PM Full Code [id number]  Charlynne Cousins, MD Inpatient   04/11/2017  6:31 PM 04/27/2017  7:51 PM Full Code [id number]  Domenic Polite, MD ED   02/23/2017  8:44 AM 02/26/2017  6:00 PM Full Code [id number]  Radene Gunning, NP ED   02/11/2017  4:13 PM 02/14/2017  6:33 PM Full Code [id number]  Robbie Lis, MD Inpatient   02/21/2016  4:25 PM 02/22/2016 11:29 AM Full Code [id number]  Irene Limbo, MD Inpatient   01/21/2016  6:36 PM 01/25/2016  3:06 PM Full Code [id number]  Debbe Odea, MD ED   12/09/2015  1:58 AM 12/12/2015  4:32 PM Full Code [id number]  Ivor Costa, MD ED       Prognosis:   guarded, could be less than 2-3 weeks at this point.   Discharge Planning:  Home with home health soon, then plans on going to [place], to be under the care of her sister who lives there, also plans on seeking second opinion from oncologists in [place].   Care plan was discussed with patient, daughter, RN, Dr Broadus [facility] MD.   Thank you for allowing the Palliative Medicine Team to assist in the care of this patient.   Time In: 1300 Time Out: 1325 Total Time 25 Prolonged Time Billed  no       Greater than 50%  of this time was spent counseling and coordinating care related to the above assessment and plan.  Loistine Chance, MD [phone]  Please contact Palliative Medicine Team phone at [phone] for questions and concerns.

## 2017-05-13 NOTE — Care Management Note (Signed)
Case Management Note  Patient Details  Name: Keon K. Mccollister MRN: [id number] Date of Birth: March 23, 1971  Subjective/Objective:                    Action/Plan:   Expected Discharge Date:                  Expected Discharge Plan:  [place]  In-House Referral:     Discharge planning Services  CM Consult  Post Acute Care Choice:  [facility]urable Medical [facility] active-HHPT/Home [phone]) Choice offered to:  Patient  DME Arranged:    DME Agency:     HH Arranged:  RN, PT, OT, Nurse's Aide, Social Work [employer] Agency:  [place]  Status of Service:  In process, will continue to follow  If discussed at Long Length of Stay Meetings, dates discussed:    Additional Comments:  Dessa Phi, RN 05/13/2017, 11:44 AM

## 2017-05-13 NOTE — Progress Notes (Signed)
PROGRESS NOTE    Cindy Bradshaw  PPI:[id number] DOB: 1971-03-13 DOA: 05/08/2017 PCP: [name], MD  Brief Narrative:Cindy Bradshaw is a 46 y.o. female with history of metastatic breast cancer presents to the ER with worsening shortness of breath over the last few days. Just Dced 9/1, 5th admission in 70months.  Assessment & Plan:   Acute hypoxic respiratory failure -Secondary to progression of pulmonary metastasis, CT scan- on admission shows rapid progression of pulmonary metastasis and adenopathy -s/p 1 unit PRBC and lasix -continue IV lasix , change to PO today -resp status more stable now  Widely metastatic breast cancer -Extensive pulmonary metastasis, bone metastases, liver mets, subcut nodules -MRI done this morning is concerning for cerebellar cystic lesion possibly metastatic and osseous metastasis as well -Patient's prognosis appears to be very poor and functional status is also very poor -This is her fifth admission in 3 months, she has been seen by palliative care during the past 2 hospitalizations -She was given a dose of immunotherapy-Pembrolizumab, and a couple of weeks back at [facility] 9/6 shows rapid progression of mediastinal adenopathy, pulmonary metastasis, liver metastasis, and right breast masses -Seen by [name] 9/6, he recommended Hospice and didn't feel that he had anything further to offer her -patient continues to remain unrealistic  -Oto numerous times, pt continues to refuse this -Palliative re consulted, s/p goals of care meeting, appreciate palliative help with this challenging situation -yesterday 9/9 when I brought up resuscitation again, [name] agreed to Limited code with No CPR/defib and No Intubation will change order -they remain hopeful to move to [place] and seek Rx there  Anemia -Acute and chronic -Secondary to chronic disease, rapidly progressing malignancy -s/p 1 unit PRBC, Hb  improved  Acute on chronic systolic CHF/nonischemic cardiomyopathy -Ejection fraction 25% -Related to chemotherapy (Doxorubicin/adrimicin) -with minimal improvement with diuretics -continue lasix at lower dose  History of pulmonary embolism -Continue full dose Lovenox  Paroxysmal atrial fibrillation -Currently in sinus rhythm. Previous episodes of intermittent rapid ventricular response suspect significant pulmonary disease burden from her metastasis contributing to A. fib RVR -Continue current regimen of amiodarone and metoprolol  Chronic pain -Secondary to metastatic cancer -Continue fentanyl patch and oxycodone per home regimen, also on high dose morphine now, Palliative to try to consolidate meds now  Code status Full Code Communication: daughter at bedside Disposition Plan: home with [facility] services this week and eventually move to [place]  Consultants:   Oncology Dr. Sonny Dandy   Subjective: -still with dyspnea but slightly better, poor PO intake  subjective: Vitals:   05/12/17 2001 05/13/17 0500 05/13/17 0523 05/13/17 1342  BP: 125/66  125/77 112/69  Pulse: (!) 109  (!) 113 (!) 107  Resp: 18  20 20   Temp: 97.7 F (36.5 C)  97.9 F (36.6 C) 99.4 F (37.4 C)  TempSrc: Oral  Oral Oral  SpO2: 100%  94% 100%  Weight:  86.9 kg (191 lb 9.3 oz)    Height:        Intake/Output Summary (Last 24 hours) at 05/13/17 1405 Last data filed at 05/13/17 0600  Gross per 24 hour  Intake              360 ml  Output                0 ml  Net              360 ml   Filed Weights   05/10/17  0175 05/12/17 0640 05/13/17 0500  Weight: 87.2 kg (192 lb 3.9 oz) 87.9 kg (193 lb 12.6 oz) 86.9 kg (191 lb 9.3 oz)    Examination:  Gen: Chronically ill female, appears much older than stated age, very debilitated, dyspneic with minimal exertion HEENT: PERRLA, Neck supple, no JVD Lungs: decreased BS at bases CVS: S1S2/tachycardic Abd: soft, Non tender, non distended, BS present Extremities: No  Cyanosis, Clubbing or edema Skin: no new rashes    Data Reviewed:   CBC:  Recent Labs Lab 05/08/17 2209 05/08/17 2228 05/09/17 0523 05/10/17 0531 05/13/17 0516  WBC 12.9*  --  12.3* 10.6* 16.6*  NEUTROABS 10.0*  --   --   --   --   HGB 7.8* 8.8* 7.7* 8.2* 8.4*  HCT 24.6* 26.0* 24.1* 25.7* 27.1*  MCV 84.2  --  82.8 86.2 86.9  PLT 478*  --  490* 468* 102*   Basic Metabolic Panel:  Recent Labs Lab 05/08/17 2209 05/08/17 2228 05/09/17 0523 05/10/17 0531 05/13/17 0516  NA 126* 125* 132* 130*  --   K 3.9 3.9 3.9 3.9  --   CL 87* 86* 90* 88*  --   CO2 28  --  30 30  --   GLUCOSE 113* 114* 109* 99  --   BUN 32* 28* 32* 31*  --   CREATININE 1.34* 1.30* 1.33* 1.21* 0.83  CALCIUM 9.4  --  9.6 9.2  --    GFR: Estimated Creatinine Clearance: 98.5 mL/min (by C-G formula based on SCr of 0.83 mg/dL). Liver Function Tests:  Recent Labs Lab 05/08/17 2209 05/10/17 0531  AST 121* 111*  ALT 126* 108*  ALKPHOS 78 74  BILITOT 0.8 0.8  PROT 6.9 6.5  ALBUMIN 2.0* 1.9*   No results for input(s): LIPASE, AMYLASE in the last 168 hours. No results for input(s): AMMONIA in the last 168 hours. Coagulation Profile:  Recent Labs Lab 05/08/17 2209  INR 1.22   Cardiac Enzymes: No results for input(s): CKTOTAL, CKMB, CKMBINDEX, TROPONINI in the last 168 hours. BNP (last 3 results) No results for input(s): PROBNP in the last 8760 hours. HbA1C: No results for input(s): HGBA1C in the last 72 hours. CBG: No results for input(s): GLUCAP in the last 168 hours. Lipid Profile: No results for input(s): CHOL, HDL, LDLCALC, TRIG, CHOLHDL, LDLDIRECT in the last 72 hours. Thyroid Function Tests: No results for input(s): TSH, T4TOTAL, FREET4, T3FREE, THYROIDAB in the last 72 hours. Anemia Panel: No results for input(s): VITAMINB12, FOLATE, FERRITIN, TIBC, IRON, RETICCTPCT in the last 72 hours. Urine analysis:    Component Value Date/Time   COLORURINE YELLOW 05/09/2017 [place] 05/09/2017 0942   LABSPEC 1.010 05/09/2017 0942   LABSPEC 1.010 04/10/2016 1224   PHURINE 5.0 05/09/2017 0942   GLUCOSEU NEGATIVE 05/09/2017 0942   GLUCOSEU Negative 04/10/2016 1224   HGBUR NEGATIVE 05/09/2017 0942   BILIRUBINUR NEGATIVE 05/09/2017 0942   BILIRUBINUR Negative 04/10/2016 1224   KETONESUR NEGATIVE 05/09/2017 0942   PROTEINUR NEGATIVE 05/09/2017 0942   UROBILINOGEN 0.2 04/10/2016 1224   NITRITE NEGATIVE 05/09/2017 0942   LEUKOCYTESUR NEGATIVE 05/09/2017 0942   LEUKOCYTESUR Negative 04/10/2016 1224   Sepsis Labs: @LABRCNTIP (procalcitonin:4,lacticidven:4)  ) Recent Results (from the past 240 hour(s))  Culture, blood (Routine x 2)     Status: None (Preliminary result)   Collection Time: 05/08/17 10:00 PM  Result Value Ref Range Status   Specimen Description BLOOD PORTA CATH  Final   Special Requests  Final    BOTTLES DRAWN AEROBIC AND ANAEROBIC Blood Culture adequate volume   Culture   Final    NO GROWTH 3 DAYS Performed at [facility], [place] [address] [place], [place] [address]    Report Status PENDING  Incomplete  Culture, blood (Routine x 2)     Status: None (Preliminary result)   Collection Time: 05/09/17  2:01 AM  Result Value Ref Range Status   Specimen Description BLOOD RIGHT ANTECUBITAL  Final   Special Requests   Final    BOTTLES DRAWN AEROBIC AND ANAEROBIC Blood Culture adequate volume   Culture   Final    NO GROWTH 3 DAYS Performed at [facility], [address] [place], [place] [address]    Report Status PENDING  Incomplete         Radiology Studies: No results found.      Scheduled Meds: . amiodarone  200 mg Oral Daily  . cholecalciferol  1,000 Units Oral Daily  . digoxin  0.125 mg Oral Daily  . enoxaparin (LOVENOX) injection  1 mg/kg Subcutaneous Q12H  . [START ON 05/14/2017] fentaNYL  75 mcg Transdermal Q72H  . ferrous sulfate  325 mg Oral Q breakfast  . furosemide  80 mg Oral Daily  .  lidocaine  1 patch Transdermal Q24H  . mouth rinse  15 mL Mouth Rinse BID  . metoprolol succinate  12.5 mg Oral Daily  . multivitamin  15 mL Oral Daily  . polyethylene glycol  17 g Oral BID  . potassium chloride SA  20 mEq Oral BID  . protein supplement shake  11 oz Oral Q24H  . sodium chloride flush  10-40 mL Intracatheter Q12H   Continuous Infusions: . sodium chloride       LOS: 4 days    Time spent: 19min    Domenic Polite, MD [facility] Pager [phone]  If 7PM-7AM, please contact night-coverage [email or web address] Password [email or web address] 05/13/2017, 2:05 PM

## 2017-05-13 NOTE — Progress Notes (Signed)
Responded to chair alarm. Pt standing at side of bed. Visible swaying, shortness of breath, eyes rolling upward. Pt. Equilibrium appeared to be off. Sat pt. Down on bed.  acquired assistance to get pt. To bedside commode. Educated pt. To use call bell before getting up so that we could assist her safely. Pt receptive. Daughter expressed "thats how she does at home." Reinforced education on purpose of call bell and added safety risk due to IV poles and medications and again educated pt. To use call bell. Set bed alarm prior to leaving pt.

## 2017-05-13 NOTE — Progress Notes (Addendum)
Chaplain responding to spiritual care consult Re: Adv. Directives   Pt completed Adv. Directive (Health care power of attorney and Living will)  Pt with original and copies.  Copy placed in chart.    Chaplains present for conference with [place] oncologist.   Provided spiritual support at bedside with pt and daughter.  47 y/o grandson present.  In conversation with MD and chaplains, pt and daughter understand prognosis and severity of illness.  Voice understanding that patient may be at end of life.   Their faith figures prominently in decisions around care.  Believe that they want to give God every opportunity to affect healing and see seeking second opinion as a part of this process.  Feel that patient being around strong family support in [place] will lift her spirits and provide additional spiritual support.  Born in [place], moved to [place] 4 years prior. Stated that if this is the end of patient's life, family is comforted knowing that they have done everything they can do to support her.  Shared prayers with pt at bedside.  [facility] / [facility] Chaplain Jerene Pitch, [place] Katherene Ponto, MDiv

## 2017-05-14 ENCOUNTER — Ambulatory Visit (HOSPITAL_COMMUNITY): Admission: RE | Admit: 2017-05-14 | Payer: Federal, State, Local not specified - PPO | Source: Ambulatory Visit

## 2017-05-14 DIAGNOSIS — Z515 Encounter for palliative care: Secondary | ICD-10-CM

## 2017-05-14 LAB — CULTURE, BLOOD (ROUTINE X 2)
CULTURE: NO GROWTH
Culture: NO GROWTH
SPECIAL REQUESTS: ADEQUATE
Special Requests: ADEQUATE

## 2017-05-14 MED ORDER — HYDROMORPHONE HCL 1 MG/ML PO LIQD
4.0000 mg | ORAL | Status: DC | PRN
Start: 2017-05-14 — End: 2017-05-14
  Administered 2017-05-14: 4 mg via ORAL
  Filled 2017-05-14: qty 4

## 2017-05-14 MED ORDER — HYDROMORPHONE HCL 1 MG/ML PO LIQD: 4.0000 mg | ORAL | 0 refills | Status: AC | PRN

## 2017-05-14 MED ORDER — HEPARIN SOD (PORK) LOCK FLUSH 100 UNIT/ML IV SOLN
500.0000 [IU] | INTRAVENOUS | Status: AC | PRN
  Administered 2017-05-14: 500 [IU]

## 2017-05-14 MED ORDER — ALPRAZOLAM 0.5 MG PO TABS: 0.5000 mg | ORAL_TABLET | Freq: Three times a day (TID) | ORAL | 0 refills | Status: AC | PRN

## 2017-05-14 MED ORDER — HYDROMORPHONE HCL-NACL 0.5-0.9 MG/ML-% IV SOSY: 1.0000 mg | PREFILLED_SYRINGE | INTRAVENOUS | Status: DC | PRN

## 2017-05-14 MED ORDER — FENTANYL 75 MCG/HR TD PT72: 75.0000 ug | MEDICATED_PATCH | TRANSDERMAL | 0 refills | Status: AC

## 2017-05-14 MED ORDER — FUROSEMIDE 40 MG PO TABS: 120.0000 mg | ORAL_TABLET | Freq: Two times a day (BID) | ORAL | 0 refills | Status: AC

## 2017-05-14 NOTE — Progress Notes (Signed)
Nutrition Follow-up  DOCUMENTATION CODES:   Not applicable  INTERVENTION:  - Continue Premier Protein once/day and Magic Cup BID. - Continue to encourage PO intakes of meals, supplements, and beverages.   NUTRITION DIAGNOSIS:   Increased nutrient needs related to cancer and cancer related treatments as evidenced by estimated needs. -ongoing  GOAL:   Patient will meet greater than or equal to 90% of their needs -unmet  MONITOR:   PO intake, Supplement acceptance, Labs, Weight trends  ASSESSMENT:   Pt with PMH of metastatic breast cancer with brain/lung metastases and  cardiomyopathy.   Recently admitted 8/16 for acute encephalopathy suspected to be from digoxin toxicity. Presents this admission with acute respiratory failure with hypoxia.   9/11 Per chart review, pt consumed 25% of dinner last night and no other intakes documented since previous RD visit. Notes indicate pt with ongoing poor appetite and intakes. Per review of order, pt has accepted 3/4 doses of Premier Protein offered. Reviewed notes in the chart and they indicate that pt may be at EOL and prognosis may be 3-4 weeks. Plan is for d/c later this week or over the weekend and the family is taking pt to [place] for a second opinion and also to be close to family. Weight has been stable since 9/6. Pt remains at very high risk for malnutrition but continue to be unable to state at this time.   Medications reviewed; 1000 units vitamin D/day, 325 mg oral ferrous sulfate/day, 80 mg oral Lasix/day, 15 mL liquid multivitamin/day, 1 packet Miralax BID, 20 mEq oral KCl BID.  Labs reviewed; Na: 130 mmol/L, Cl: 88 mmol/L, BUN: 31 mg/dL, LFTs elevated.    9/6 - Spoke with pt at bedside.  - Pt has persistent cough making it hard to speak.  - Reports having a decrease in appetite related to her cough.  - Denies nausea/vomiting.  - Could not elaborate on daily intake, but states she is trying to drink oral nutrition supplements.   - Pt is currently on a 1200 ml fluid restriction.  - Records show pt has lost 8% of body wt in two months.  - This is significant, though hard to tell how much is actual dry wt loss.   - Will monitor wt trends this hospital stay.  Nutrition-Focused physical exam completed. Findings are no fat depletion, no muscle depletion, and mild edema in BLE.  Pt does not meet criteria for malnutrition at this time. If decreased PO intake and wt loss persist, pt is at high risk for malnutrition.    Diet Order:  Diet Heart Room service appropriate? Yes; Fluid consistency: Thin; Fluid restriction: 1200 mL Fluid  Skin:  Reviewed, no issues  Last BM:  9/11  Height:   Ht Readings from Last 1 Encounters:  05/09/17 5\' 9"  (1.753 m)    Weight:   Wt Readings from Last 1 Encounters:  05/14/17 195 lb 12.3 oz (88.8 kg)    Ideal Body Weight:  61.4 kg  BMI:  Body mass index is 28.91 kg/m.  Estimated Nutritional Needs:   Kcal:  1900-2100 (31-34 kcal/kg IBW)  Protein:  95-105 grams (1.5-1.7 g/kg IBW)  Fluid:  >1.9 L/day  EDUCATION NEEDS:   No education needs identified at this time    Jarome Matin, MS, RD, LDN, CNSC Inpatient Clinical Dietitian Pager # [phone] After hours/weekend pager # [phone]

## 2017-05-14 NOTE — Progress Notes (Signed)
D/C to home w/ Dtr via w/c.appears in no distress.All d/c instructions given w/ verbal understanding to pt and dtr.

## 2017-05-14 NOTE — Progress Notes (Signed)
Daily Progress Note   Patient Name: Cindy Bradshaw       Date: 05/14/2017 DOB: [birth year]  Age: 46 y.o. MRN#: [id number] Attending Physician: Domenic Polite, MD Primary Care Physician: [name], MD Admit Date: 05/08/2017  Reason for Consultation/Follow-up: Establishing goals of care, pain and non pain management, psychosocial support.   Subjective: I met today with Cindy Bradshaw.  Reviewed her clinical course since last admission when I saw her including decrease in nutrition, cognition, and functional status.  She reports plan to d/c home and move to [place] this weekend to live with her sister.  Currently denies pain or other needs. See below    Length of Stay: 5  Current Medications: Scheduled Meds:  . amiodarone  200 mg Oral Daily  . cholecalciferol  1,000 Units Oral Daily  . digoxin  0.125 mg Oral Daily  . enoxaparin (LOVENOX) injection  1 mg/kg Subcutaneous Q12H  . fentaNYL  75 mcg Transdermal Q72H  . ferrous sulfate  325 mg Oral Q breakfast  . furosemide  80 mg Oral Daily  . lidocaine  1 patch Transdermal Q24H  . mouth rinse  15 mL Mouth Rinse BID  . metoprolol succinate  12.5 mg Oral Daily  . multivitamin  15 mL Oral Daily  . polyethylene glycol  17 g Oral BID  . potassium chloride SA  20 mEq Oral BID  . protein supplement shake  11 oz Oral Q24H  . sodium chloride flush  10-40 mL Intracatheter Q12H    Continuous Infusions: . sodium chloride      PRN Meds: acetaminophen **OR** acetaminophen, albuterol, albuterol, ALPRAZolam, diphenhydrAMINE, guaiFENesin-dextromethorphan, heparin lock flush, HYDROmorphone (DILAUDID) injection, HYDROmorphone HCl, hydrOXYzine, lactulose, methocarbamol, ondansetron **OR** ondansetron (ZOFRAN) IV, senna, sodium chloride  flush  Physical Exam         Awake weak [place] regular pattern of breathing S1 S2 Abdomen non tender Trace edema   Vital Signs: BP (!) 104/54 (BP Location: Right Arm)   Pulse 100   Temp 98.6 F (37 C) (Axillary)   Resp 17   Ht 5' 9"  (1.753 m)   Wt 88.8 kg (195 lb 12.3 oz)   SpO2 100%   BMI 28.91 kg/m  SpO2: SpO2: 100 % O2 Device: O2 Device: Nasal Cannula O2 Flow Rate: O2 Flow Rate (L/min): 2 L/min  Intake/output summary:   Intake/Output Summary (Last 24 hours) at 05/14/17 1632 Last data filed at 05/14/17 0600  Gross per 24 hour  Intake              360 ml  Output              240 ml  Net              120 ml   LBM: Last BM Date: 05/14/17 Baseline Weight: Weight: 83 kg (183 lb) Most recent weight: Weight: 88.8 kg (195 lb 12.3 oz)       Palliative Assessment/Data:    Flowsheet Rows     Most Recent Value  Intake Tab  Referral Department  Hospitalist  Unit at Time of Referral  Oncology Unit  Palliative Care Primary Diagnosis  Cancer  Date Notified  05/10/17  Palliative Care Type  New Palliative care  Reason for referral  Pain, Clarify Goals of Care, Counsel Regarding Hospice  Date of Admission  05/08/17  Date first seen by Palliative Care  05/11/17  # of days IP prior to Palliative referral  2  Clinical Assessment  Palliative Performance Scale Score  30%  Pain Max last 24 hours  5  Pain Min Last 24 hours  4  Dyspnea Max Last 24 Hours  4  Dyspnea Min Last 24 hours  3  Nausea Max Last 24 Hours  2  Nausea Min Last 24 Hours  1  Anxiety Max Last 24 Hours  4  Anxiety Min Last 24 Hours  3  Psychosocial & Spiritual Assessment  Palliative Care Outcomes  Patient/Family meeting held?  Yes  Who was at the meeting?  patient daughter   Palliative Care Outcomes  Clarified goals of care      Patient Active Problem List   Diagnosis Date Noted  . Goals of care, counseling/discussion   . Generalized pain   . Acute respiratory failure with hypoxia ([place]) 05/09/2017   . AKI (acute kidney injury) ([place]) 05/01/2017  . Chronic respiratory failure ([place]) 05/01/2017  . Digitalis toxicity 05/01/2017  . Toxic encephalopathy 05/01/2017  . Thrombocytosis ([place]) 05/01/2017  . Leucocytosis 05/01/2017  . Encounter for palliative care   . Acute on chronic combined systolic and diastolic heart failure ([place])   . Asthma, chronic, unspecified asthma severity, with acute exacerbation 04/11/2017  . Acute respiratory failure ([place]) 04/11/2017  . Hypoxia 04/04/2017  . Dyspnea 02/23/2017  . Acute pulmonary embolism ([place]) 02/23/2017  . Cardiomyopathy ([place])   . A-fib ([place]) 02/13/2017  . Metastatic breast cancer ([place])   . DCM (dilated cardiomyopathy) ([place])   . Atrial fibrillation with RVR ([place]) 02/11/2017  . Chest pain   . Adnexal cyst: Right per CT 12/09/2015 12/12/2015  . Chemotherapy-induced peripheral neuropathy ([place]) 12/05/2015  . Normocytic normochromic anemia 09/19/2015  . Breast cancer of upper-outer quadrant of left female breast ([place]) 09/07/2015    Palliative Care Assessment & Plan   Patient Profile:  Cindy Bradshaw is a 46 year old lady with metastatic breast cancer. She has pulmonary, bone, liver metastases take disease. She came in with shortness of breath and is found to have progression of her pulmonary metastatic disease. She received immunotherapy recently. She has been followed by Dr. Lindi Adie recently. She received immunotherapy recently. However, she has ongoing extensive metastatic burden. MRI shows cerebellar cystic lesion, possibly metastatic in OCS metastases as well. Additionally, CT scanPatient has required 8 mg IV Morphine times 8 in the past 24 hours, she has 50  mcg TDF patch.   shows rapid progression of mediastinal adenopathy and right breast masses.  Palliative consultation for goals of care discussions.  Assessment:  back pain Generalized pain Cancer related anorexia and cachexia Existential suffering   Recommendations/Plan: Pain management:  TDF. Also on PO and PRN IV Dilaudid. Also has Lidoderm patch and Robaxin.  Reports pain well controlled at this time.  Plan to continue same TDF and PO rescue dilaudid on d/c.  Anxiety:  Patient is on Atarax and also has PRN benadryl  to be used if there is itching with Dilaudid use, will also help with anxiety.   - D/C home with home health care, to go to [place] this weekend for second opinion and to be closer to family.   Prognosis:   guarded, could be less than 2-3 weeks at this point.   Discharge Planning:  Home with home health.  Tells me that she plans on going to [place], to be with her sister and seeking second opinion from oncologists in [place].   Care plan was discussed with patient, RN, Dr Broadus [facility] MD.   Thank you for allowing the Palliative Medicine Team to assist in the care of this patient.   Time In: 1545 Time Out: 1615 Total Time 30 Prolonged Time Billed  no       Greater than 50%  of this time was spent counseling and coordinating care related to the above assessment and plan.  Micheline Rough, MD [phone] Please contact Palliative Medicine Team phone at [phone] for questions and concerns.

## 2017-05-14 NOTE — Discharge Summary (Signed)
Physician Discharge Summary  Cindy Bradshaw Dibbles ZOX:[id number] DOB: 17-Mar-1971 DOA: 05/08/2017  PCP: [name], MD  Admit date: 05/08/2017 Discharge date: 05/14/2017  Time spent: 35 minutes  Recommendations for Outpatient Follow-up:  1. Patient has been told numerous times that Our recommendation is for Hospice and we have nothing else of value to offer her at this time, she plans to move to [place] to be closer to her sister in a few days and seek a second opinion there 2. If she changes her mind about Hospice she is advised to call [name]'s office to set up Hospice services any time   Discharge Diagnoses:  Principal Problem:   Acute respiratory failure with hypoxia ([place])   Widely metastatic Breast Cancer   Rapidly progressing Lung metastasis   Diffuse liver mets, osseous metastasis   Suspected Brain metastasis   Normocytic normochromic anemia   Metastatic breast cancer ([place])   Chronic systolic CHF   DCM (dilated cardiomyopathy) ([place])   A-fib ([place])   Goals of care, counseling/discussion   Generalized pain   Discharge Condition: poor/prognosis very poor  Diet recommendation: low sodium  Filed Weights   05/12/17 0640 05/13/17 0500 05/14/17 0500  Weight: 87.9 kg (193 lb 12.6 oz) 86.9 kg (191 lb 9.3 oz) 88.8 kg (195 lb 12.3 oz)    History of present illness:  Cindy Bradshaw a 46 y.o.femalewith history of metastatic breast cancer presents to the ER with worsening shortness of breath over the last few days. Just Dced 9/1, 5th admission in 94months.  Hospital Course:  Acute hypoxic respiratory failure -Secondary to progression of pulmonary metastasis, CT scan- on admission shows rapid progression of pulmonary metastasis and adenopathy -s/p 1 unit PRBC and lasix -treated with IV lasix without much benefit, change to PO lasix, resp status fluctuates from time to time and at this time she is dyspneic with any activity which is improved from admission  Widely  metastatic breast cancer -Extensive pulmonary metastasis, bone metastases, liver mets, subcut nodules -MRI brain this admission is concerning for cerebellar cystic lesion possibly metastatic and osseous metastasis as well -Patient's prognosis appears to be very poor and functional status is also very poor -This is her fifth admission in 3 months, she has been seen by palliative care during the past 2 hospitalizations and this admission as well -She was given a dose of immunotherapy-Pembrolizumab, and a couple of weeks back at [facility] by [place] -Ct 9/6 shows rapid progression of mediastinal adenopathy, pulmonary metastasis, liver metastasis, and right breast masses -Seen by [name] 9/6, he recommended Hospice and didn't feel that he had anything further to offer her -patient continues to remain unrealistic  -[place] numerous times, pt continues to refuse this -Palliative re consulted, s/p goals of care meeting, patient and daughter remain unrealistic and now they plan to take her to [place] in a few days with her sister and seek treatment and a second opinion there -on 9/9 when I brought up resuscitation again, Ms.Morocho agreed to Limited code with No CPR/defib and No Intubation -discharged home this afternoon after extensive discussions again, she was told that If she changes her mind about Hospice she is advised to call [name]'s office to set up Hospice services any time, in the meantime she plans to move to [place] later this week. -her pain regimen was adjusted, fentanyl patch increased and started on PO dilaudid with Palliative MDs assistance  Anemia -Acute and chronic -Secondary to chronic disease, rapidly progressing malignancy -  s/p 1 unit PRBC, Hb improved  Acute on chronic systolic CHF/nonischemic cardiomyopathy -Ejection fraction 25% -Related to chemotherapy (Doxorubicin/adrimicin) -with minimal improvement with diuretics -continue  lasix at lower dose  History of pulmonary embolism -Continue full dose Lovenox  Paroxysmal atrial fibrillation -Currently in sinus rhythm. Previous episodes of intermittent rapid ventricular response suspect significant pulmonary disease burden from her metastasis contributing to A. fib RVR -Continue current regimen of amiodarone and metoprolol  Chronic pain -Secondary to metastatic cancer -Continue fentanyl patch, dose increased, Palliative assisted in consolidating Po meds and started on PO dilaudid this admission  Consultations:  [name]  Palliative medicine  Discharge Exam: Vitals:   05/14/17 0826 05/14/17 1300  BP:  (!) 104/54  Pulse: (!) 108 100  Resp:  17  Temp:  98.6 F (37 C)  SpO2:  100%    General: AAOx3 Cardiovascular: S1S2/RRR Respiratory: CTAB  Discharge Instructions   Discharge Instructions    Diet - low sodium heart healthy    Complete by:  As directed    Increase activity slowly    Complete by:  As directed      Current Discharge Medication List    START taking these medications   Details  ALPRAZolam (XANAX) 0.5 MG tablet Take 1 tablet (0.5 mg total) by mouth 3 (three) times daily as needed for anxiety or sleep. Qty: 30 tablet, Refills: 0    HYDROmorphone HCl (DILAUDID) 1 MG/ML LIQD Take 4 mLs (4 mg total) by mouth every 3 (three) hours as needed for moderate pain or severe pain. Qty: 210 mL, Refills: 0      CONTINUE these medications which have CHANGED   Details  fentaNYL (DURAGESIC - DOSED MCG/HR) 75 MCG/HR Place 1 patch (75 mcg total) onto the skin every 3 (three) days. Qty: 10 patch, Refills: 0    furosemide (LASIX) 40 MG tablet Take 3 tablets (120 mg total) by mouth 2 (two) times daily. Qty: 60 tablet, Refills: 0      CONTINUE these medications which have NOT CHANGED   Details  albuterol (PROVENTIL HFA;VENTOLIN HFA) 108 (90 Base) MCG/ACT inhaler Inhale 1-2 puffs into the lungs every 6 (six) hours as needed for wheezing or  shortness of breath. Qty: 1 Inhaler, Refills: 0    albuterol (PROVENTIL) (2.5 MG/3ML) 0.083% nebulizer solution Take 2.5 mg by nebulization every 6 (six) hours as needed for wheezing or shortness of breath.    amiodarone (PACERONE) 200 MG tablet Take 1 tablet (200 mg total) by mouth daily. Qty: 60 tablet, Refills: 0    cholecalciferol (VITAMIN D) 1000 units tablet Take 1,000 Units by mouth daily.    digoxin (LANOXIN) 0.125 MG tablet Take 1 tablet (0.125 mg total) by mouth daily. Qty: 30 tablet, Refills: 0    enoxaparin (LOVENOX) 100 MG/ML injection Inject 1 mL (100 mg total) into the skin every 12 (twelve) hours. Qty: 30 Syringe, Refills: 2    ferrous sulfate 325 (65 FE) MG EC tablet Take 325 mg by mouth daily with breakfast.     guaiFENesin-dextromethorphan (ROBITUSSIN DM) 100-10 MG/5ML syrup Take 5 mLs by mouth every 4 (four) hours as needed for cough. Qty: 118 mL, Refills: 0    lactulose (CHRONULAC) 10 GM/15ML solution Take 30 mLs (20 g total) by mouth 2 (two) times daily as needed for mild constipation or moderate constipation. Qty: 240 mL, Refills: 0    Melatonin 10 MG TABS Take 10 mg by mouth at bedtime.     methocarbamol (ROBAXIN) 500 MG tablet  Take 1 tablet (500 mg total) by mouth every 6 (six) hours as needed for muscle spasms. Qty: 30 tablet, Refills: 0    metoprolol succinate (TOPROL-XL) 25 MG 24 hr tablet Take 0.5 tablets (12.5 mg total) by mouth daily. Qty: 30 tablet, Refills: 0    ondansetron (ZOFRAN) 8 MG tablet Take 1 tablet (8 mg total) by mouth every 8 (eight) hours as needed (Nausea or vomiting). Qty: 30 tablet, Refills: 1   Associated Diagnoses: Malignant neoplasm of upper-outer quadrant of left breast in female, estrogen receptor negative (HCC)    polyethylene glycol (MIRALAX / GLYCOLAX) packet Take 17 g by mouth 2 (two) times daily. Qty: 14 each, Refills: 0    potassium chloride SA (K-DUR,KLOR-CON) 20 MEQ tablet Take 2 tablets (40 mEq total) by mouth  daily. Qty: 60 tablet, Refills: 0    senna (SENOKOT) 8.6 MG TABS tablet Take 1 tablet by mouth daily as needed for mild constipation.      STOP taking these medications     morphine (MSIR) 30 MG tablet      predniSONE (DELTASONE) 5 MG tablet        Allergies  Allergen Reactions  . Dilaudid [Hydromorphone Hcl] Itching  . Ivp Dye [Iodinated Diagnostic Agents] Itching  . Percocet [Oxycodone-Acetaminophen] Itching  . Tape Itching and Rash    Hypofix causes rash  . Toradol [Ketorolac Tromethamine] Itching   Follow-up Information    [facility].   Why:  [place] nursing,physical/occupational therapy/aide,social Web designer information: [place] [id number] [phone]        [place] Follow up.   Why:  home oxygen Contact information: [address] [place] [address] [phone]            The results of significant diagnostics from this hospitalization (including imaging, microbiology, ancillary and laboratory) are listed below for reference.    Significant Diagnostic Studies: Dg Chest 2 View  Result Date: 05/08/2017 CLINICAL DATA:  Shortness of breath. EXAM: CHEST  2 VIEW COMPARISON:  Radiograph 05/01/2017, chest CT 04/12/2017 FINDINGS: Tip of the right chest port in the SVC. Lower lung volumes from prior exam. Multifocal pulmonary metastatic disease on the hilar adenopathy as seen on prior chest CT. Heart size and mediastinal contours are grossly stable per Right pleural effusion has increased from prior. No pneumothorax. IMPRESSION: 1. Increased right pleural effusion from prior exam. 2. Pulmonary metastatic disease and hilar adenopathy, grossly stable in radiographic appearance from prior. Electronically Signed   By: Jeb Levering M.D.   On: 05/08/2017 22:08   Dg Chest 2 View  Result Date: 04/17/2017 CLINICAL DATA:  History of metastatic breast cancer. Shortness of breath. EXAM:  CHEST  2 VIEW COMPARISON:  04/14/2017, CT 04/12/2017 FINDINGS: Cardiomegaly with vascular congestion. Suspect small layering effusions. Bibasilar atelectasis or infiltrates. Nodular airspace opacities compatible with known metastatic disease. No real change since prior study. IMPRESSION: No significant change since prior study. Electronically Signed   By: Rolm Baptise M.D.   On: 04/17/2017 10:30   Dg Abd 1 View  Result Date: 04/20/2017 CLINICAL DATA:  Vomiting for 3 days.  Chemotherapy for breast cancer EXAM: ABDOMEN - 1 VIEW COMPARISON:  Staging CT 01/30/2017 FINDINGS: Diffuse formed stool, confluent throughout the left colon. No obstruction or rectal impaction. No concerning mass effect or calcification. Cholecystectomy clips. IMPRESSION: Moderate to large stool volume without obstruction or rectal impaction. Electronically Signed   By: Angelica Chessman  Watts M.D.   On: 04/20/2017 08:32   Ct Chest Wo Contrast  Result Date: 05/09/2017 CLINICAL DATA:  46 y/o F; history of breast cancer with lung and brain metastasis. Chemotherapy and x-ray therapy completed with ongoing immunotherapy. Shortness of breath with chest pain under the left breast. EXAM: CT CHEST WITHOUT CONTRAST TECHNIQUE: Multidetector CT imaging of the chest was performed following the standard protocol without IV contrast. COMPARISON:  04/12/2017 CT of the chest FINDINGS: Cardiovascular: Right port catheter with tip in the right atrium. Normal heart size. No pericardial effusion. Normal caliber thoracic aorta. Mediastinum/Nodes: Diffuse extensive mediastinal adenopathy and right paratracheal, left paratracheal, prevascular, AP window, carinal, and bilateral hilar stations is increased for example a right lower paratracheal node measures 25 x 31 mm, previously 19 x 27 mm and a right cardiophrenic node measuring 47 mm, previously 33 mm. Normal thyroid gland. Normal thoracic esophagus. Lungs/Pleura: Numerous pulmonary nodules are increased in size for  example a nodule in the left lung apex measuring 18 mm, previously 15 mm. Pulmonary metastasis are greatest in the right infrahilar region with air confluent with the hilum, narrowing airways, and extending into the lung base. The Small right pleural effusion is decreased. Upper Abdomen: Marked progression of diffuse hepatic metastasis with several new and enlarging when compared prior CT of chest. Musculoskeletal: Multiple masses in the right breast are increased in size, for example a right lateral breast mass measuring 27 mm, previously 18 mm. IMPRESSION: Rapid progression of mediastinal adenopathy, pulmonary metastasis, liver metastasis, and right breast masses. Electronically Signed   By: Kristine Garbe M.D.   On: 05/09/2017 00:54   Mr Brain Wo Contrast  Result Date: 05/09/2017 CLINICAL DATA:  History of metastatic breast cancer. Patient presents with worsening shortness of breath and acute encephalopathy. EXAM: MRI HEAD WITHOUT CONTRAST TECHNIQUE: Axial DWI, coronal DWI, sagittal T1, axial T2, axial T2 FLAIR sequences were acquired. COMPARISON:  04/12/2017 CT head FINDINGS: Brain: Cystic lesion in right cerebellar hemisphere measuring 11 mm with surrounding T2 FLAIR hyperintense signal abnormality (series 6, image 9 and series 7, image 9). There several additional T2 hyperintense, T1 hypointense, FLAIR hypointense foci scattered throughout the supratentorial brain with surrounding T2 FLAIR signal. There is no reduced diffusion to suggest acute or early subacute infarction. No significant mass effect, effacement of basilar cisterns, or extra-axial collection. Vascular: Normal flow voids. Skull and upper cervical spine: Right posterior temporal bone periosteal lesion the outer cortex measuring 16 x 7 mm (series 6, image 17). Sinuses/Orbits: Negative. Other: None. IMPRESSION: 1. No acute infarct, acute hemorrhage, or significant mass effect. 2. Cystic lesion in right cerebellar hemisphere measuring  11 mm with surrounding edema, metastasis is suspected. 3. Several additional subcentimeter nonspecific lesions are present in the supratentorial brain which may represent the additional foci of metastasis. Differential includes sequelae of ischemia, demyelination, or prior infectious/inflammatory process. 4. Periosteal lesion in outer cortex of right temporal bone, additional possible metastasis. 5. MRI of the brain or CT of the head with and without contrast is recommended when patient is able. Electronically Signed   By: Kristine Garbe M.D.   On: 05/09/2017 07:03   Dg Chest Portable 1 View  Result Date: 05/01/2017 CLINICAL DATA:  Increased shortness of breath this morning, history cardiomyopathy, metastatic breast cancer, GERD, asthma EXAM: PORTABLE CHEST 1 VIEW COMPARISON:  Portable exam 1134 hours compared to 04/22/2017 and correlated with CT chest of 04/12/2017 FINDINGS: RIGHT jugular Port-A-Cath with tip projecting over RIGHT atrium. Normal heart size. Hilar enlargement  bilaterally corresponding to known adenopathy. Pulmonary vascularity normal. Nodular opacities in the chest bilaterally corresponding to metastases on prior CT. Small RIGHT pleural effusion. No definite acute infiltrate or pneumothorax. No acute bone lesions. IMPRESSION: Known pulmonary metastases and hilar adenopathy. Small RIGHT pleural effusion. Electronically Signed   By: Lavonia Dana M.D.   On: 05/01/2017 11:53   Dg Chest Port 1 View  Result Date: 04/22/2017 CLINICAL DATA:  Shortness of breath.  History of leukocytosis. EXAM: PORTABLE CHEST 1 VIEW COMPARISON:  04/17/2017.  CT 04/12/2017 . FINDINGS: PowerPort catheter noted with lead tip projected over right atrium. Stable cardiomegaly stable densities are noted in the lungs consistent with known metastatic disease. Basilar atelectasis. Small right pleural effusion. No pneumothorax. Surgical clips left chest. No acute bony abnormality. IMPRESSION: 1. PowerPort catheter  stable position. 2. Stable pulmonary densities noted bilaterally consistent known metastatic disease. Basilar atelectasis. Small right pleural effusion. No significant changes noted from prior exams . Electronically Signed   By: Marcello Moores  Register   On: 04/22/2017 08:53   [place] Lt Lower Extrem Ltd Soft Tissue Non Vascular  Result Date: 05/02/2017 CLINICAL DATA:  Left medial mass for 2 weeks EXAM: ULTRASOUND LEFT LOWER EXTREMITY LIMITED TECHNIQUE: Ultrasound examination of the lower extremity soft tissues was performed in the area of clinical concern. COMPARISON:  None. FINDINGS: 5.3 x 4 x 4.7 cm well-circumscribed hypoechoic mass in the medial which appears to be within the medial gastrocnemius muscle. Mild equivocal internal Doppler flow. No other solid or cystic mass. IMPRESSION: 1. Indeterminate 5.4 x 4.7 cm well-circumscribed hypoechoic mass in the medial gastrocnemius muscle. Differential considerations include a soft tissue mass such as a soft tissue sarcoma versus a hematoma, but a hematoma should not have internal Doppler flow. Recommend further evaluation with a MRI of the left lower leg without and with intravenous contrast. Electronically Signed   By: Kathreen Devoid   On: 05/02/2017 09:32    Microbiology: Recent Results (from the past 240 hour(s))  Culture, blood (Routine x 2)     Status: None   Collection Time: 05/08/17 10:00 PM  Result Value Ref Range Status   Specimen Description BLOOD PORTA CATH  Final   Special Requests   Final    BOTTLES DRAWN AEROBIC AND ANAEROBIC Blood Culture adequate volume   Culture   Final    NO GROWTH 5 DAYS Performed at [facility], [address], [place], [place] [address]    Report Status 05/14/2017 FINAL  Final  Culture, blood (Routine x 2)     Status: None   Collection Time: 05/09/17  2:01 AM  Result Value Ref Range Status   Specimen Description BLOOD RIGHT ANTECUBITAL  Final   Special Requests   Final    BOTTLES DRAWN AEROBIC AND ANAEROBIC  Blood Culture adequate volume   Culture   Final    NO GROWTH 5 DAYS Performed at [facility], [place] [address] [place], [place] [address]    Report Status 05/14/2017 FINAL  Final     Labs: Basic Metabolic Panel:  Recent Labs Lab 05/08/17 2209 05/08/17 2228 05/09/17 0523 05/10/17 0531 05/13/17 0516  NA 126* 125* 132* 130*  --   K 3.9 3.9 3.9 3.9  --   CL 87* 86* 90* 88*  --   CO2 28  --  30 30  --   GLUCOSE 113* 114* 109* 99  --   BUN 32* 28* 32* 31*  --   CREATININE 1.34* 1.30*  1.33* 1.21* 0.83  CALCIUM 9.4  --  9.6 9.2  --    Liver Function Tests:  Recent Labs Lab 05/08/17 2209 05/10/17 0531  AST 121* 111*  ALT 126* 108*  ALKPHOS 78 74  BILITOT 0.8 0.8  PROT 6.9 6.5  ALBUMIN 2.0* 1.9*   No results for input(s): LIPASE, AMYLASE in the last 168 hours. No results for input(s): AMMONIA in the last 168 hours. CBC:  Recent Labs Lab 05/08/17 2209 05/08/17 2228 05/09/17 0523 05/10/17 0531 05/13/17 0516  WBC 12.9*  --  12.3* 10.6* 16.6*  NEUTROABS 10.0*  --   --   --   --   HGB 7.8* 8.8* 7.7* 8.2* 8.4*  HCT 24.6* 26.0* 24.1* 25.7* 27.1*  MCV 84.2  --  82.8 86.2 86.9  PLT 478*  --  490* 468* 474*   Cardiac Enzymes: No results for input(s): CKTOTAL, CKMB, CKMBINDEX, TROPONINI in the last 168 hours. BNP: BNP (last 3 results)  Recent Labs  02/22/17 2336 04/11/17 1421 05/08/17 2209  BNP 116.2* 225.9* 42.3    ProBNP (last 3 results) No results for input(s): PROBNP in the last 8760 hours.  CBG: No results for input(s): GLUCAP in the last 168 hours.     SignedDomenic Polite MD.  [facility] 05/14/2017, 4:12 PM

## 2017-05-14 NOTE — Progress Notes (Signed)
All d/c instructions given and awaiting Dtr for ride.

## 2017-05-22 ENCOUNTER — Telehealth: Payer: Self-pay

## 2017-05-22 NOTE — Telephone Encounter (Signed)
Received VM from [facility] nurse regarding patient. She has declined eval with occupational therapy at this time. Dr. Lindi Adie made aware.  Cyndia Bent RN

## 2017-06-03 DEATH — deceased

## 2017-07-24 NOTE — Telephone Encounter (Signed)
error 

## 2017-10-24 ENCOUNTER — Ambulatory Visit: Payer: Federal, State, Local not specified - PPO | Admitting: Hematology and Oncology

## 2018-04-15 ENCOUNTER — Other Ambulatory Visit: Payer: Self-pay | Admitting: Adult Health

## 2018-09-03 IMAGING — CT CT ABD-PELV W/ CM
2 of 5 series · 12 of 36 positions shown, 15 images · IV contrast (APPLIED)
Comparison: 12/04/2016

CLINICAL DATA: Metastatic left breast cancer

EXAM:
CT CHEST, ABDOMEN, AND PELVIS WITH CONTRAST
TECHNIQUE: Multidetector CT imaging of the chest, abdomen and pelvis was
performed following the standard protocol during bolus
administration of intravenous contrast.
CONTRAST:  100mL CAINU6-REE IOPAMIDOL (CAINU6-REE) INJECTION 61%

[Series 2: cap with · axial · 0.98mm/px · z∈[-559,-84]mm · 9 of 119 slices shown, 12 images]
[im 12/119  mediastinal]
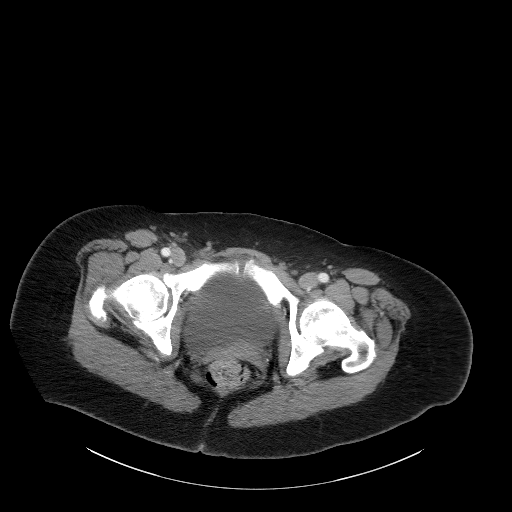
[im 12/119  lung]
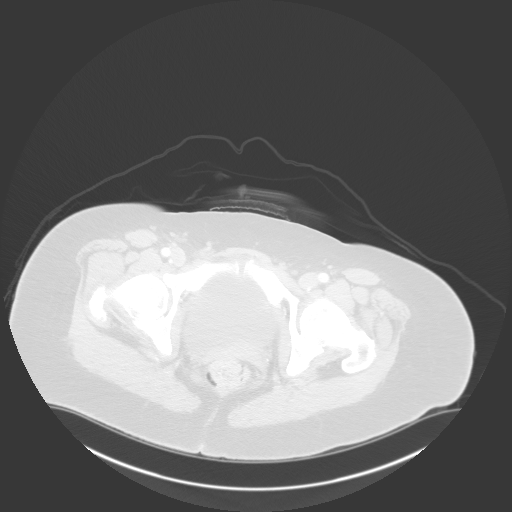
[im 24/119  lung]
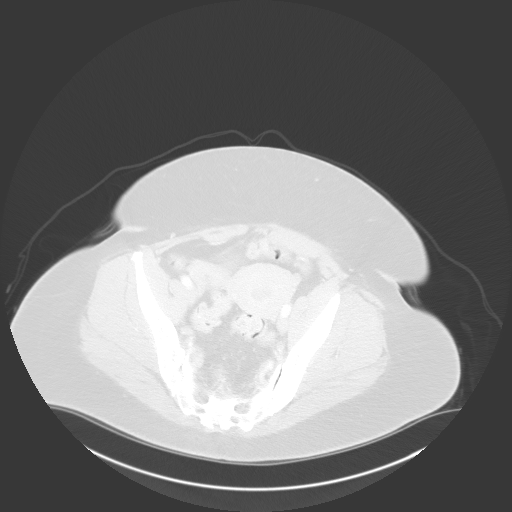
[im 36/119  lung]
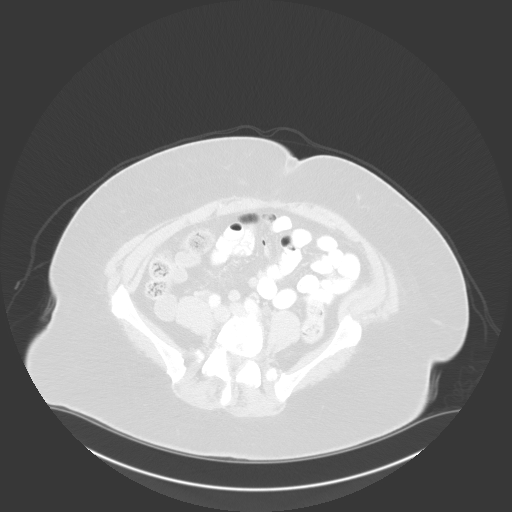
[im 48/119  lung]
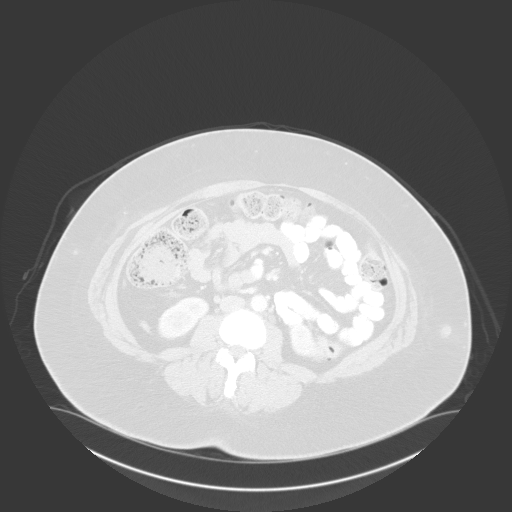
[im 60/119  mediastinal]
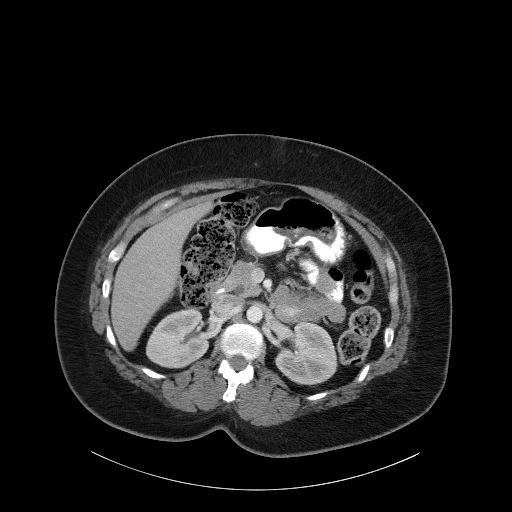
[im 60/119  lung]
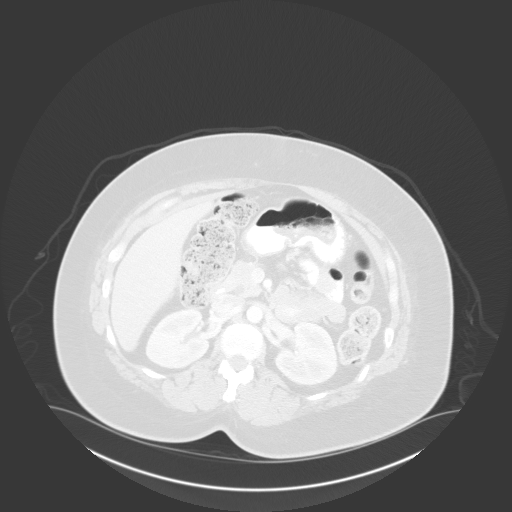
[im 71/119  lung]
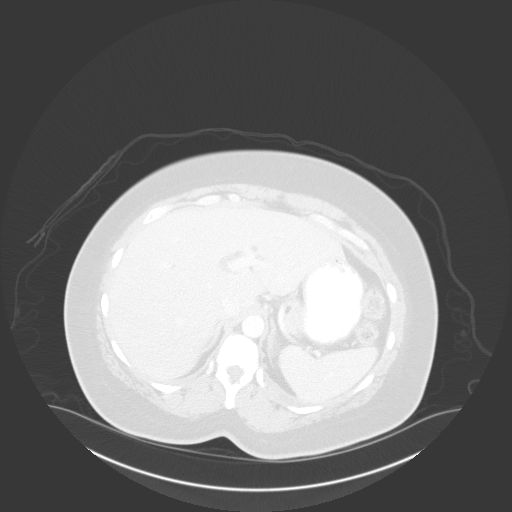
[im 83/119  lung]
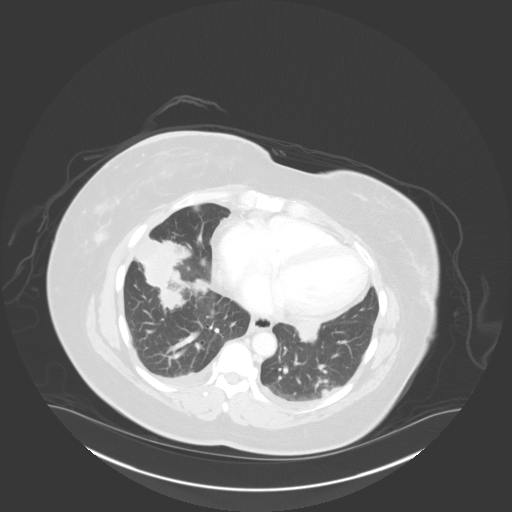
[im 95/119  lung]
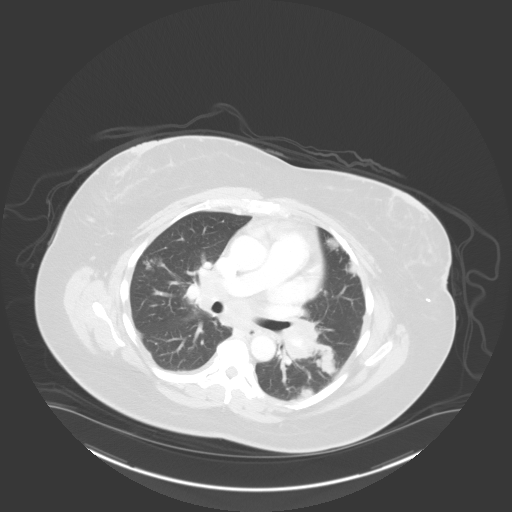
[im 107/119  mediastinal]
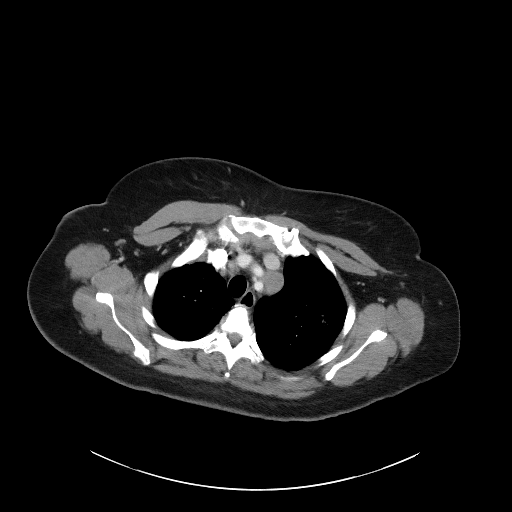
[im 107/119  lung]
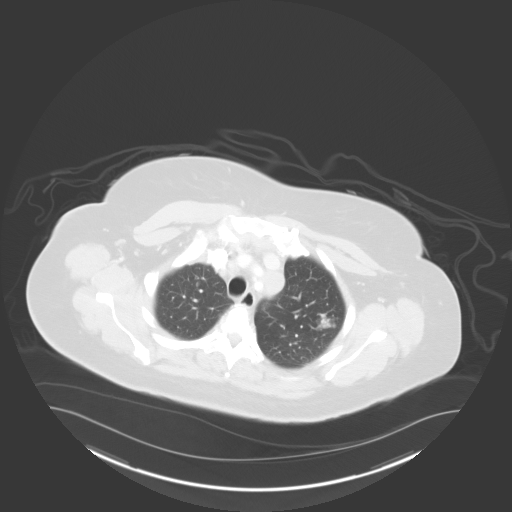

[Series 5: coronals · coronal · 0.82mm/px · 3 of 137 slices shown]
[im 28/137  lung]
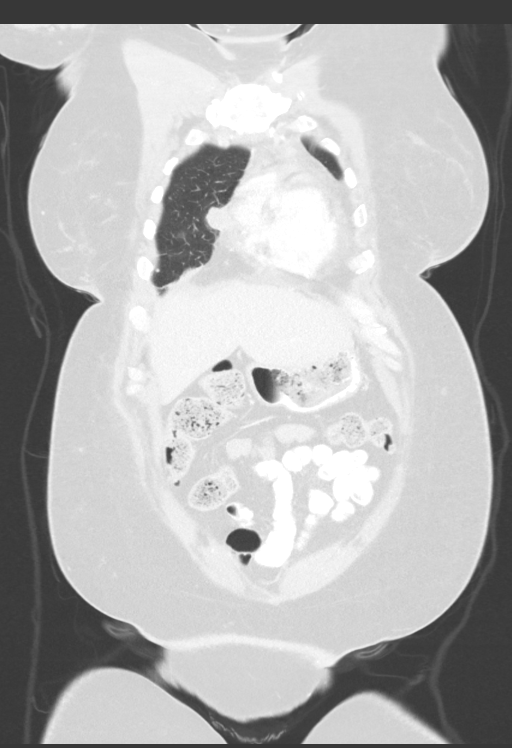
[im 55/137  lung]
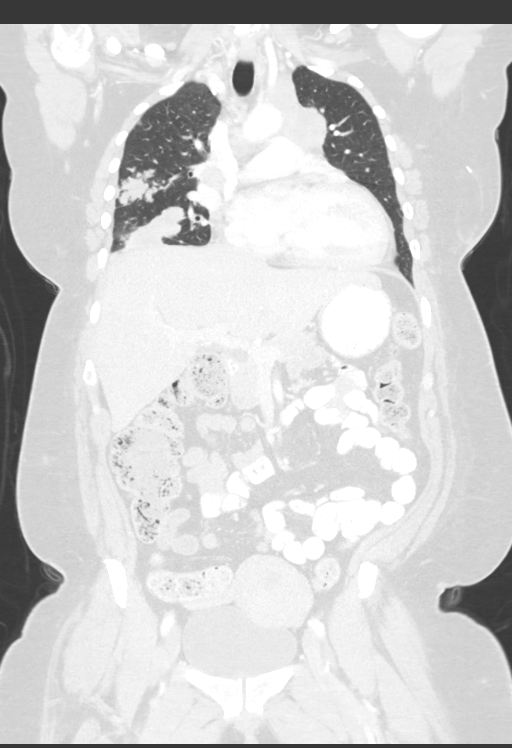
[im 82/137  lung]
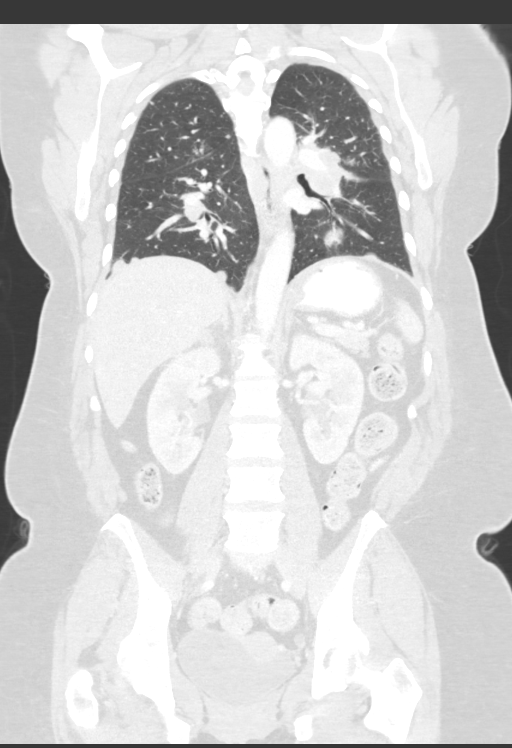

[12 of 36 positions shown; findings below may reference images not displayed]

FINDINGS: CT CHEST FINDINGS

Cardiovascular: No acute aortic findings. Extrinsic mass effect on
portions of the pulmonary arterial tree due to extensive hilar and
mediastinal adenopathy.

Mediastinum/Nodes: Extensive than worsened prevascular, AP window,
paratracheal, hilar, infrahilar, and internal mammary adenopathy.
Conglomerate AP window adenopathy measures 3.6 cm in short axis,
previously 2.8 cm. A subcarinal node measures 2.6 cm in short axis,
previously 2.2 cm. The size comparisons are made to the prior PET-CT
from 12/04/2016.

Prior left axillary dissection.

Lungs/Pleura: Compared to the prior PET-CT, there is increase in
size and number of scattered pulmonary nodules and masses. For
example, a pleural-based mass along the lingula measures 1.9 by
cm, previously 1.5 by 1.0 cm. A masslike density along the right
hemidiaphragm measures 8.0 by 5.5 cm on image 93/4, previously
by 2.3 cm. One example of a new nodule is the 5 mm pleural-based
nodule on image 42 series 4 along the right upper lobe. There many
an additional new pulmonary nodules.

Trace bilateral pleural effusions with pleural masses favoring
malignant effusions.

Musculoskeletal: New and enlarging nodules and masses along the
right breast. A right inferior breast mass measures 4.3 by 3.8 cm on
image 42/2 and was previously 1.4 cm in diameter. A medial mass in
the right breast measures 2.8 by 1.7 cm on image 32/2 and was
previously 0.8 cm in diameter. A new nodule centrally in the right
breast measures 1.0 cm in diameter on image [DATE]. Other breast and
subcutaneous nodules are present on the right.

Thoracic spondylosis.

CT ABDOMEN PELVIS FINDINGS

Hepatobiliary: Mild intrahepatic biliary dilatation. Common bile
duct measures 1.2 cm in diameter. No focal hepatic lesion
identified.

Pancreas: Unremarkable

Spleen: Unremarkable

Adrenals/Urinary Tract: Unremarkable

Stomach/Bowel: Prominent stool throughout the colon favors
constipation.

Vascular/Lymphatic: Unremarkable

Reproductive: Unremarkable

Other: Scattered new subcutaneous nodules along the abdomen are
worrisome for subcutaneous deposits of tumor. An index lesion
measures 1.5 by 0.9 cm on image 73 of series 2. These are new
compared to the prior exam.

Musculoskeletal: Lumbar spondylosis and degenerative disc disease
with right foraminal impingement at L5-S1 due to spurring, image
93/6. Transitional S1 vertebra.
IMPRESSION: 1. Worsened tumor burden with significant abnormal nodal enlargement
in the chest; new and enlarged pulmonary nodules and masses; new and
enlarged right breast and subcutaneous masses and nodules,
compatible with metastatic disease.
2. Trace bilateral pleural effusions may be malignant given the
visible pleural nodules.
3.  Prominent stool throughout the colon favors constipation.
4. Mild biliary dilatation, some of which may be a physiologic
response to cholecystectomy.
5. Right foraminal impingement at L5-S1 due to spurring.
